# Patient Record
Sex: Female | Born: 1945 | Race: White | Hispanic: No | Smoking: Former smoker
Health system: Southern US, Community
[De-identification: ages and names within clinical notes are randomized; demographics above are authoritative.]

## PROBLEM LIST (undated history)

## (undated) DIAGNOSIS — T7840XA Allergy, unspecified, initial encounter: Secondary | ICD-10-CM

## (undated) DIAGNOSIS — M109 Gout, unspecified: Secondary | ICD-10-CM

## (undated) DIAGNOSIS — G4730 Sleep apnea, unspecified: Secondary | ICD-10-CM

## (undated) DIAGNOSIS — IMO0001 Reserved for inherently not codable concepts without codable children: Secondary | ICD-10-CM

## (undated) DIAGNOSIS — M1990 Unspecified osteoarthritis, unspecified site: Secondary | ICD-10-CM

## (undated) DIAGNOSIS — E119 Type 2 diabetes mellitus without complications: Secondary | ICD-10-CM

## (undated) DIAGNOSIS — I10 Essential (primary) hypertension: Secondary | ICD-10-CM

## (undated) DIAGNOSIS — R112 Nausea with vomiting, unspecified: Secondary | ICD-10-CM

## (undated) DIAGNOSIS — E785 Hyperlipidemia, unspecified: Secondary | ICD-10-CM

## (undated) DIAGNOSIS — D509 Iron deficiency anemia, unspecified: Secondary | ICD-10-CM

## (undated) DIAGNOSIS — R0902 Hypoxemia: Secondary | ICD-10-CM

## (undated) DIAGNOSIS — J45909 Unspecified asthma, uncomplicated: Secondary | ICD-10-CM

## (undated) DIAGNOSIS — Z9889 Other specified postprocedural states: Secondary | ICD-10-CM

## (undated) HISTORY — DX: Hyperlipidemia, unspecified: E78.5

## (undated) HISTORY — DX: Gout, unspecified: M10.9

## (undated) HISTORY — DX: Essential (primary) hypertension: I10

## (undated) HISTORY — PX: APPENDECTOMY: SHX54

## (undated) HISTORY — DX: Unspecified osteoarthritis, unspecified site: M19.90

## (undated) HISTORY — PX: HEMORROIDECTOMY: SUR656

## (undated) HISTORY — DX: Allergy, unspecified, initial encounter: T78.40XA

## (undated) HISTORY — DX: Type 2 diabetes mellitus without complications: E11.9

## (undated) HISTORY — DX: Hypoxemia: R09.02

## (undated) HISTORY — PX: DILATION AND CURETTAGE OF UTERUS: SHX78

## (undated) HISTORY — PX: TONSILLECTOMY AND ADENOIDECTOMY: SUR1326

## (undated) HISTORY — DX: Iron deficiency anemia, unspecified: D50.9

## (undated) HISTORY — PX: OTHER SURGICAL HISTORY: SHX169

---

## 1975-10-20 HISTORY — PX: CHOLECYSTECTOMY: SHX55

## 1978-10-19 HISTORY — PX: HEMORRHOID BANDING: SHX5850

## 2005-05-22 ENCOUNTER — Ambulatory Visit: Payer: Self-pay | Admitting: Unknown Physician Specialty

## 2005-07-19 LAB — HM COLONOSCOPY

## 2005-08-07 ENCOUNTER — Ambulatory Visit: Payer: Self-pay | Admitting: Unknown Physician Specialty

## 2005-08-08 ENCOUNTER — Ambulatory Visit: Payer: Self-pay | Admitting: Internal Medicine

## 2006-02-19 ENCOUNTER — Ambulatory Visit: Payer: Self-pay | Admitting: Family Medicine

## 2006-05-21 ENCOUNTER — Encounter: Payer: Self-pay | Admitting: Family Medicine

## 2006-06-10 ENCOUNTER — Ambulatory Visit: Payer: Self-pay

## 2006-06-19 ENCOUNTER — Encounter: Payer: Self-pay | Admitting: Family Medicine

## 2006-09-15 ENCOUNTER — Ambulatory Visit: Payer: Self-pay

## 2006-10-04 ENCOUNTER — Ambulatory Visit: Payer: Self-pay

## 2007-04-11 DIAGNOSIS — E669 Obesity, unspecified: Secondary | ICD-10-CM | POA: Insufficient documentation

## 2007-04-11 DIAGNOSIS — E668 Other obesity: Secondary | ICD-10-CM | POA: Insufficient documentation

## 2007-10-26 ENCOUNTER — Ambulatory Visit: Payer: Self-pay | Admitting: Family Medicine

## 2008-11-13 ENCOUNTER — Ambulatory Visit: Payer: Self-pay | Admitting: Family Medicine

## 2009-06-11 DIAGNOSIS — L84 Corns and callosities: Secondary | ICD-10-CM | POA: Insufficient documentation

## 2009-10-19 HISTORY — PX: HERNIA REPAIR: SHX51

## 2009-12-25 ENCOUNTER — Ambulatory Visit: Payer: Self-pay | Admitting: General Surgery

## 2010-01-02 ENCOUNTER — Ambulatory Visit: Payer: Self-pay | Admitting: General Surgery

## 2010-01-09 ENCOUNTER — Ambulatory Visit: Payer: Self-pay | Admitting: Family Medicine

## 2010-03-25 ENCOUNTER — Ambulatory Visit: Payer: Self-pay | Admitting: Family Medicine

## 2011-02-12 ENCOUNTER — Ambulatory Visit: Payer: Self-pay | Admitting: Family Medicine

## 2011-02-21 ENCOUNTER — Emergency Department: Payer: Self-pay | Admitting: Unknown Physician Specialty

## 2011-03-18 ENCOUNTER — Ambulatory Visit: Payer: Self-pay

## 2011-03-25 ENCOUNTER — Ambulatory Visit: Payer: Self-pay | Admitting: Anesthesiology

## 2011-03-26 ENCOUNTER — Ambulatory Visit: Payer: Self-pay

## 2011-03-30 LAB — PATHOLOGY REPORT

## 2011-03-31 LAB — HM PAP SMEAR: HM Pap smear: NORMAL

## 2011-08-26 ENCOUNTER — Encounter: Payer: Self-pay | Admitting: Rheumatology

## 2011-09-03 ENCOUNTER — Ambulatory Visit: Payer: Self-pay

## 2012-07-28 ENCOUNTER — Ambulatory Visit: Payer: Self-pay | Admitting: Family Medicine

## 2012-07-29 LAB — HM DEXA SCAN: HM Dexa Scan: ABNORMAL

## 2012-11-03 ENCOUNTER — Ambulatory Visit: Payer: Self-pay | Admitting: Family Medicine

## 2013-02-20 ENCOUNTER — Ambulatory Visit: Payer: Self-pay | Admitting: Family Medicine

## 2014-03-26 LAB — LIPID PANEL
Cholesterol: 162 mg/dL (ref 0–200)
HDL: 56 mg/dL (ref 35–70)
LDL Cholesterol: 57 mg/dL
Triglycerides: 245 mg/dL — AB (ref 40–160)

## 2014-08-03 ENCOUNTER — Ambulatory Visit: Payer: Self-pay | Admitting: Family Medicine

## 2014-08-03 LAB — HM MAMMOGRAPHY: HM Mammogram: NORMAL

## 2014-08-23 DIAGNOSIS — M51369 Other intervertebral disc degeneration, lumbar region without mention of lumbar back pain or lower extremity pain: Secondary | ICD-10-CM | POA: Insufficient documentation

## 2014-08-23 DIAGNOSIS — M5136 Other intervertebral disc degeneration, lumbar region: Secondary | ICD-10-CM | POA: Insufficient documentation

## 2014-11-30 LAB — HEMOGLOBIN A1C: Hgb A1c MFr Bld: 6.7 % — AB (ref 4.0–6.0)

## 2014-12-23 ENCOUNTER — Observation Stay: Payer: Self-pay | Admitting: Internal Medicine

## 2014-12-23 DIAGNOSIS — E049 Nontoxic goiter, unspecified: Secondary | ICD-10-CM | POA: Insufficient documentation

## 2014-12-23 DIAGNOSIS — R7989 Other specified abnormal findings of blood chemistry: Secondary | ICD-10-CM

## 2014-12-23 DIAGNOSIS — R079 Chest pain, unspecified: Secondary | ICD-10-CM

## 2014-12-24 ENCOUNTER — Other Ambulatory Visit: Payer: Self-pay | Admitting: Physician Assistant

## 2014-12-24 DIAGNOSIS — I361 Nonrheumatic tricuspid (valve) insufficiency: Secondary | ICD-10-CM

## 2015-02-01 ENCOUNTER — Other Ambulatory Visit: Payer: Self-pay | Admitting: Family Medicine

## 2015-02-01 DIAGNOSIS — E049 Nontoxic goiter, unspecified: Secondary | ICD-10-CM

## 2015-02-17 NOTE — Discharge Summary (Signed)
PATIENT NAME:  Amber Avery, Amber Avery MR#:  [id number] DATE OF BIRTH:  January 30, 1946  DATE OF ADMISSION:  12/23/2014 DATE OF DISCHARGE:  12/24/2014  PRIMARY CARE PHYSICIAN:  Steele Sizer, MD.    FINAL DIAGNOSES:  1. Pleuritic chest pain and leukocytosis.  2. Headache, unspecified.  3. Hypertension, essential.  4. History of gout.  5. Multinodular goiter, needs follow up as outpatient.  6. Diabetes.   MEDICATIONS ON DISCHARGE:  1. Benazepril 20 mg daily.  2. Furosemide 20 mg 1-2 tablets daily.  3. Klor-Con 20 mEq 1-2 tablets daily.  4. Atorvastatin 40 mg at bedtime.  5. Gabapentin 300 mg 2-3 times a day.  6. Allopurinol 300 mg daily.  7. Colchicine 0.6 mg twice a day as needed for gout.  8. Voltaren topical 1% 2-4 grams topically every 6 hours as needed for pain.  9. Triamcinolone topical 0.1% apply to affected area, she does have a cream and an ointment for that.  10. 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  11. Vitamin D3, 1000 international units at bedtime.  12. Aspirin 81 mg daily.  13. Prednisone 5 mg 4 tablets day 1, 3 tablets day 2, 2 tablets day 3, 1 tablet day 4 and 5.  14. Levaquin 500 mg daily for 9 days.  15. Stop taking metformin, but can restart on 12/25/2014 in the evening.   DIET: Low sodium, carbohydrate-controlled diet, regular consistency.   ACTIVITY: As tolerated.   FOLLOWUP:   1-2 weeks with Dr. Ancil Boozer.    HOSPITAL COURSE: The patient was admitted 12/23/2014, discharged 12/24/2014. Came in with chest pain and severe headache, was is admitted with chest pain unclear etiology.   LABORATORY AND RADIOLOGICAL DATA DURING THE HOSPITAL COURSE:  Included an EKG, showed normal sinus rhythm, no acute ST-T wave changes. Chest x-ray stable, mild likely chronic interstitial change. CPK normal range. Sedimentation rate normal range. BNP 106. D-dimer elevated at 2976. Glucose 193, BUN 28, creatinine 1.22, sodium 138, potassium 4.0, chloride 102, CO2 of 28,  calcium 9.5. Liver function tests normal range. White blood cell count 22.8, H and H 13.5 and 43.6, platelet count of 293,000. Troponin negative. CT scan of the head showed subtle low attenuation of the left temporal lobe with suggestion of mass effect on the temporal horn of the lateral ventricle, MRI suggested. Repeat EKG showed normal sinus rhythm. CT angio of the chest for PE showed poor to moderate exam quality for evaluation of pulmonary embolism, no large embolism identified, cardiomegaly with LAD, coronary artery atherosclerosis, enlarged thyroid with multiple nodules most likely multinodular goiter, consider  nonemergent characterization with thyroid ultrasound. Cardiac enzymes remain negative. Echocardiogram showed poor windows, EF 60%-65%, mild to moderate elevation of pulmonary arterial systolic pressure. MRI of the brain showed no acute abnormality, minimal chronic microvascular ischemia, left temporal lobe shows no focal abnormality, no mass, edema, or infarction present. CT finding likely represents artifact. Pulse oximetry room air 90%.   HOSPITAL COURSE PER PROBLEM LIST:  1.  Pleuritic chest pain and leukocytosis, likely pleurisy. We will give antibiotic and steroid. I did give prednisone here. Will prescribe Levaquin upon going home and a steroid taper. The patient was feeling better since coming into the hospital.  2.  Headache. I needed to give Toradol and IV magnesium, headache got better. The patient was well enough to go home. MRI of the brain was negative.  3.  Hypertension, essential, kept on her usual medications.  4.  History of gout, on allopurinol.  5.  Multinodular goiter.  Will need followup as outpatient with regards to thyroid testing and consideration of thyroid ultrasound. Follow up with Dr. Ancil Boozer for this.  6.  Diabetes. I am holding the Glucophage for 2 days secondary to IV contrast with CT scan.  TIME SPENT ON DISCHARGE: 35 minutes.     ____________________________ Tana Conch. Leslye Peer, MD rjw:bu D: 12/24/2014 15:35:02 ET T: 12/24/2014 18:21:03 ET JOB#: [id number]  cc: Tana Conch. Leslye Peer, MD, <Dictator> Bethena Roys. Ancil Boozer, MD Marisue Brooklyn MD ELECTRONICALLY SIGNED 12/26/2014 10:53

## 2015-02-17 NOTE — H&P (Signed)
 PATIENT NAME:  Amber Avery, Amber Avery MR#:  [id number] DATE OF BIRTH:  [birth year]  DATE OF ADMISSION:  12/23/2014  REFERRING PHYSICIAN: Sharyn Creamer, MD.   FAMILY PHYSICIAN: Onnie Boer. Sowles, MD.   REASON FOR ADMISSION: Chest pain with severe headache.   HISTORY OF PRESENT ILLNESS: The patient is a 69 year old female with a history of chronic back pain, sleep apnea, migraine headaches, diabetes, hypertension and hyperlipidemia. Presents to the emergency room today with acute onset of diffuse chest pain radiating to the neck and head. Some shortness of breath. No nausea, vomiting. No visual disturbance. In the emergency room, the patient was noted to have a leukocytosis and an abnormal EKG of unclear significance. Denies previous cardiac history. She is now admitted for further evaluation.   PAST MEDICAL HISTORY: 1. Chronic low back pain.  2. Obesity.  3. Migraine headaches.  4. Obstructive sleep apnea.  5. Benign hypertension.  6. Hyperlipidemia.  7. Type 2 diabetes.   MEDICATIONS: 1. Voltaren gel 2 to 4 grams every 6 hours as needed.  2. Demadex 20 mg p.o. b.i.d.  3. Zocor 80 mg p.o. at bedtime.  4. K-Dur 20 mEq p.o. b.i.d.  5. Metformin 500 mg p.o. b.i.d.  6. Gabapentin 300 mg p.o. 3 times a day.  7. Benazepril 20 mg p.o. b.i.d.  8. Aspirin 81 mg p.o. daily.  9. Allopurinol 300 mg p.o. daily.   ALLERGIES: CODEINE.   SOCIAL HISTORY: The patient quit smoking 15 years ago. No history of alcohol abuse.   FAMILY HISTORY: Positive for leukemia, coronary artery disease and hypertension. Also positive for diabetes. Negative for colon or breast cancer.   REVIEW OF SYSTEMS:  CONSTITUTIONAL: No fever or change in weight.  EYES: No blurred or double vision. No glaucoma.  ENT: No tinnitus or hearing loss. No nasal discharge or bleeding. No difficulty swallowing.  RESPIRATORY: No cough or wheezing. Denies hemoptysis. Does have some painful respiration.  CARDIOVASCULAR: Chest pain as per HPI. No  orthopnea or palpitations. No syncope.  GASTROINTESTINAL: No nausea, vomiting or diarrhea. No abdominal pain or change in bowel habits.  GENITOURINARY: No dysuria or hematuria. No incontinence.  ENDOCRINE: No polyuria or polydipsia. No heat or cold intolerance.  HEMATOLOGICAL: The patient denies anemia, easy bruising or bleeding.  LYMPHATIC: No swollen glands.  MUSCULOSKELETAL: The patient denies pain in her neck, shoulders, knees, hips. Some back pain. No gout.  NEUROLOGIC: No numbness. No recent migraines. Denies previous stroke or seizures.  PSYCHIATRIC: The patient denies anxiety, insomnia or depression.   PHYSICAL EXAMINATION: GENERAL: The patient is currently in no acute distress.  VITAL SIGNS: Remarkable for a blood pressure of 112/63, heart rate 102, respiratory rate 16, temperature 97.8, saturation 92% on room air.  HEENT: Normocephalic, atraumatic. Pupils regular and reactive to light and accommodation. Extraocular movements are intact. Sclerae are anicteric. Conjunctivae are clear. Oropharynx is clear.  NECK: Supple without JVD. No adenopathy or thyromegaly was noted.  LUNGS: Reveal scattered rhonchi without wheezes or rales. No dullness. Respiratory effort is normal.  CARDIAC: Regular rate and rhythm with normal S1, S2. No significant rubs, murmurs or gallops. PMI is nondisplaced. Chest wall is nontender.  ABDOMEN: Soft, nontender with normoactive bowel sounds. No organomegaly or masses were appreciated. No hernias or bruits were noted.  EXTREMITIES: Without clubbing or cyanosis; 1+ edema present. Distal pulses were 2+ bilaterally.  SKIN: Warm and dry without rash or lesions.  NEUROLOGIC: Cranial nerves II through XII grossly intact. Deep tendon reflexes were symmetric. Motor  and sensory examination is nonfocal.  PSYCHIATRIC: Revealed a patient who is alert and oriented to person, place and time. She was cooperative and used good judgment.   LABORATORY DATA: EKG revealed sinus  rhythm with diffuse ST elevation noted inferior laterally. Chest x-ray revealed chronic interstitial changes. Head CT reveals a questionable defect in the left temporal region suggestive of mass. White count was 22.8 with a hemoglobin of 13.5. Sedimentation rate was normal at 28. Initial troponin was less than 0.02. Glucose 193 with a BUN of 28, creatinine 1.22 and a GFR of 47.   ASSESSMENT: 1. Chest pain of unclear etiology.  2. Abnormal EKG.  3. Headache with abnormal head CT.  4. Dehydration.  5. Stage 2 chronic kidney disease.  6. Obesity.   PLAN: The patient will be observed on telemetry. We will obtain CT scan of the chest and an MRI of the brain. We will also obtain an echocardiogram. We will consult cardiology given the patient's chest pain and EKG abnormalities. We will follow her sugars with Accu-Cheks before meals and at bedtime and add sliding scale insulin as needed. Hydrate overnight with IV fluids. Begin empiric IV antibiotics given her leukocytosis. Follow up routine labs in the morning. Further treatment and evaluation will depend upon the patient's progress.   TOTAL TIME SPENT: On this patient was 50 minutes.     ____________________________ Duane Lope Judithann Sheen, MD jds:TT D: 12/23/2014 16:37:44 ET T: 12/23/2014 17:48:29 ET JOB#: [id number]  cc: Duane Lope. Judithann Sheen, MD, <Dictator> Onnie Boer. Carlynn Purl, MD Keeanna Villafranca Rodena Medin MD ELECTRONICALLY SIGNED 12/23/2014 20:11

## 2015-02-17 NOTE — Consult Note (Signed)
General Aspect 69 yo female with hx of HTN, DM, OSA admitted with pleuretic CP , elevated WBC and  abnormal ECG   Present Illness Pt sees Dr. Steele Sizer on a regular basis.  she has done well for years. has hx of THN with well controlled BP.  has Obstructive sleep apnea - uses CPAP every night. woke up with severe pleuretic CP.  intense pain with every inspiration.  extends from just below her breasts , up to her neck and into her head.   bilateral CP  denies fever, sputun, no hempotysis had a spinal injection several weeks ago .  was not bedridden for any significant length of time but she does not exercise   Physical Exam:  GEN well developed, well nourished, obese   HEENT pink conjunctivae, PERRL, hearing intact to voice, moist oral mucosa   NECK supple  No masses   RESP normal resp effort  clear BS   CARD Regular rate and rhythm  Normal, S1, S2  No murmur   ABD denies tenderness  denies Flank Tenderness  soft  normal BS   LYMPH negative neck   EXTR negative cyanosis/clubbing   SKIN normal to palpation, No rashes   NEURO cranial nerves intact, follows commands, motor/sensory function intact   PSYCH alert, A+O to time, place, person, good insight   Review of Systems:  Subjective/Chief Complaint CP since this am, definite pleuretic component   General: No Complaints   Skin: No Complaints   ENT: No Complaints   Eyes: No Complaints   Neck: No Complaints   Cardiovascular: No Complaints   Gastrointestinal: No Complaints   Vascular: No Complaints   Musculoskeletal: No Complaints   Neurologic: No Complaints   Hematologic: No Complaints   Endocrine: No Complaints   Psychiatric: No Complaints   Review of Systems: All other systems were reviewed and found to be negative   Family & Social History:  Family and Social History:  Family History Non-Contributory  no cardiac hx   Social History negative tobacco, negative ETOH   Place of Living Home      Lower Back Pinched nerve:    Sleep Apnea:    Migraines:   Home Medications: Medication Instructions Status  allopurinol 300 mg oral tablet 1  orally   daily Active  calcipotriene topical    60 GM apply at HS Active  benazepril 20 mg oral tablet 1  orally  2 x a day Active  potassiumCL   ER Micro takes 56mq 1 or 2 daily  Active  simvastatin 80 mg oral tablet 1  orally  daily HS Active  metformin 500 mg oral tablet 1 tab  two x a day Active  gabapentin 300 mg oral capsule 1 tab 3 x a day Active  Voltaren    uses 2 to 4 GRAMS every  6 hrs as needed Active  triamcinolone topical    one percent    uses pea sized amount to area as directed Active  vitamin D once weekly Active  asa takes 859mdaily Active  torsemide 20 mg oral tablet 1  orally  2 x a day Active   Lab Results: Hepatic:  06-Mar-16 14:36   Bilirubin, Total 0.8  Alkaline Phosphatase 104  SGPT (ALT) 25  SGOT (AST)  13  Total Protein, Serum 8.1  Albumin, Serum 4.2  Routine Chem:  06-Mar-16 14:36   Result Comment Hemoglobin/Hematocrit - RESULTS VERIFIED BY REPEAT TESTING.  Result(s) reported on 23 Dec 2014  at 03:00PM.  Glucose, Serum  193  BUN  28  Creatinine (comp) 1.22  Sodium, Serum 138  Potassium, Serum 4.0  Chloride, Serum 102  CO2, Serum 28  Calcium (Total), Serum 9.5  Osmolality (calc) 286  eGFR (African American)  57  eGFR (Non-African American)  47 (eGFR values <107m/min/1.73 m2 may be an indication of chronic kidney disease (CKD). Calculated eGFR, using the MRDR Study equation, is useful in  patients with stable renal function. The eGFR calculation will not be reliable in acutely ill patients when serum creatinine is changing rapidly. It is not useful in patients on dialysis. The eGFR calculation may not be applicable to patients at the low and high extremes of body sizes, pregnant women, and vegetarians.)  Anion Gap 8  Cardiac:  06-Mar-16 14:36   Troponin I < 0.02 (0.00-0.05 0.05 ng/mL or less:  NEGATIVE  Repeat testing in 3-6 hrs  if clinically indicated. >0.05 ng/mL: POTENTIAL  MYOCARDIAL INJURY. Repeat  testing in 3-6 hrs if  clinically indicated. NOTE: An increase or decrease  of 30% or more on serial  testing suggests a  clinically important change)  Routine Coag:  06-Mar-16 14:36   D-Dimer, Quantitative 2976 (INTERPRETATION <> Exclusion of Venous Thromboembolism (VTE) - OUTPATIENT ONLY       (Emergency Department or [place])             0-499 ng/ml (FEU)  : With a low to intermediate pretest                                  probability for VTE this test result                                  excludes the diagnosis of VTE.             > 499 ng/ml (FEU)  : VTE not excluded; additional work up                                  for VTE is required. <> Testing on Inpatients and Evaluation of Disseminated Intravascular        Coagulation (DIC)             Reference Range:  0-499 ng/ml (FEU))  Prothrombin 13.0 (11.4-15.0 NOTE: New Reference Range  11/16/14)  INR 1.0 (INR reference interval applies to patients on anticoagulant therapy. A single INR therapeutic range for coumarins is not optimal for all indications; however, the suggested range for most indications is 2.0 - 3.0. Exceptions to the INR Reference Range may include: Prosthetic heart valves, acute myocardial infarction, prevention of myocardial infarction, and combinations of aspirin and anticoagulant. The need for a higher or lower target INR must be assessed individually. Reference: The Pharmacology and Management of the Vitamin K  antagonists: the seventh ACCP Conference on Antithrombotic and Thrombolytic Therapy. CPJASN.0539Sept:126 (3suppl): [id number] A HCT value >55% may artifactually increase the PT.  In one study,  the increase was an average of 25%. Reference:  "Effect on Routine and Special Coagulation Testing Values of Citrate Anticoagulant Adjustment in Patients with High HCT Values." American  Journal of Clinical Pathology 27673;419:379-024)  Routine Hem:  06-Mar-16 14:36   Erythrocyte Sed Rate 28 (Result(s) reported on 23 Dec 2014 at 04:13PM.)  Neutrophil % 88.1  Lymphocyte % 5.3  Monocyte % 6.2  Eosinophil % 0.0  Basophil % 0.4  Neutrophil #  20.0  Lymphocyte # 1.2  Monocyte #  1.4  Eosinophil # 0.0  Basophil # 0.1  Reference Accession# [id number] (Result(s) reported on 23 Dec 2014 at 04:01PM.)  WBC (CBC)  22.8  RBC (CBC) 4.61  Hemoglobin (CBC) 13.5  Hematocrit (CBC) 43.6  Platelet Count (CBC) 293  MCV 95  MCH 29.3  MCHC  31.0  RDW  16.3   EKG:  EKG Interp. by me  NSR   Interpretation mild diffuse , nonspecific upsloping ST elevation.  appears c/w early repol.  no old tracings to compare   Radiology Results: XRay:    06-Mar-16 13:00, Chest PA and Lateral  Chest PA and Lateral   REASON FOR EXAM:    Chest Pain  COMMENTS:       PROCEDURE: DXR - DXR CHEST PA (OR AP) AND LATERAL  - Dec 23 2014  1:00PM     CLINICAL DATA:  Initial evaluation for mid sternal chest pain this  morning    EXAM:  CHEST  2 VIEW    COMPARISON:  02/20/2013    FINDINGS:  Heart size is normal. Vascular pattern is normal. Mild interstitial  prominence stable from prior study. No consolidation or effusion.     IMPRESSION:  Stable mild likely chronic interstitial change. The stability of  this appearance suggests mild chronic interstitial inflammatory  process.      Electronically Signed    By: Skipper Cliche M.D.    On: 12/23/2014 14:14         Verified By: Rachael Fee, M.D.,  CT:    06-Mar-16 15:02, CT Head Without Contrast  CT Head Without Contrast   REASON FOR EXAM:    headache since this morning, also having chest pain  COMMENTS:       PROCEDURE: CT  - CT HEAD WITHOUT CONTRAST  - Dec 23 2014  3:02PM     CLINICAL DATA:  Initial evaluation for headache with chest pain and  neck pain today    EXAM:  CT HEAD WITHOUT CONTRAST    TECHNIQUE:  Contiguous  axial images were obtained from the base of the skull  through the vertex without intravenous contrast.  COMPARISON:  None.    FINDINGS:  Mild age-appropriate atrophy. No intracranial hemorrhage or  extra-axial fluid. Subtle low-attenuation left temporal lobe. No  hydrocephalus. Left ventricular temporal horn asymmetrically thin  when compared to the contralateral side. Calvarium intact.     IMPRESSION:  Subtle low-attenuation left temporal lobe with suggestion of mass  effect on the temporal horn of the lateral ventricle. The  possibility of infarct or edema related to mass in this area is not  excluded. MRI suggested.    Electronically Signed    By: Skipper Cliche M.D.    On: 12/23/2014 16:26         Verified By: Rachael Fee, M.D.,    Codeine: GI Distress   Impression 1. Pleuretic Chest pain :  her symptoms are more consistent with a pulmonary process - ? pneumonia vs. pulmonary embolus.  CT angio of the chest has been ordered   Her ECG changes are not suggestive of MI.    continue to get Troponin levels.   Troponin is negative , despite having CP all day . she informed me at the end of my exam that she has  seen Dr. Clayborn Bigness in the past.  she has had a stress test that reportedly was normal.  Further evaluation and management per [facility] .   Agree with echo tomorrow to rule out pericarditis.  She has mild ST elevation but no PR depression.  2.  elevated WBC:  her CXR does not show any infiltrate.  no clear etiology for her pain and elevated WBC.  CT angio of the chest will be helpful.   3. Essential HTN:  continue current meds.  4. DM type 2.  plans per int. Med.   5. Obstructive sleep apnea:  continue CPAP .   Electronic Signatures: Nahser, Dreama Saa (MD)  (Signed 06-Mar-16 16:52)  Authored: General Aspect/Present Illness, History and Physical Exam, Review of System, Family & Social History, Past Medical History, Home Medications, Labs, EKG  , Radiology, Allergies, Impression/Plan   Last Updated: 06-Mar-16 16:52 by Acie Fredrickson Dreama Saa (MD)

## 2015-03-26 ENCOUNTER — Other Ambulatory Visit: Payer: Self-pay | Admitting: Family Medicine

## 2015-04-03 ENCOUNTER — Encounter: Payer: Self-pay | Admitting: Family Medicine

## 2015-04-03 DIAGNOSIS — G5600 Carpal tunnel syndrome, unspecified upper limb: Secondary | ICD-10-CM | POA: Insufficient documentation

## 2015-04-03 DIAGNOSIS — I2510 Atherosclerotic heart disease of native coronary artery without angina pectoris: Secondary | ICD-10-CM | POA: Insufficient documentation

## 2015-04-03 DIAGNOSIS — M1990 Unspecified osteoarthritis, unspecified site: Secondary | ICD-10-CM | POA: Insufficient documentation

## 2015-04-03 DIAGNOSIS — I10 Essential (primary) hypertension: Secondary | ICD-10-CM | POA: Insufficient documentation

## 2015-04-03 DIAGNOSIS — M47817 Spondylosis without myelopathy or radiculopathy, lumbosacral region: Secondary | ICD-10-CM | POA: Insufficient documentation

## 2015-04-03 DIAGNOSIS — M109 Gout, unspecified: Secondary | ICD-10-CM | POA: Insufficient documentation

## 2015-04-03 DIAGNOSIS — M5416 Radiculopathy, lumbar region: Secondary | ICD-10-CM | POA: Insufficient documentation

## 2015-04-03 DIAGNOSIS — E785 Hyperlipidemia, unspecified: Secondary | ICD-10-CM | POA: Insufficient documentation

## 2015-04-03 DIAGNOSIS — L409 Psoriasis, unspecified: Secondary | ICD-10-CM | POA: Insufficient documentation

## 2015-04-03 DIAGNOSIS — E559 Vitamin D deficiency, unspecified: Secondary | ICD-10-CM | POA: Insufficient documentation

## 2015-04-03 DIAGNOSIS — J45909 Unspecified asthma, uncomplicated: Secondary | ICD-10-CM | POA: Insufficient documentation

## 2015-04-03 DIAGNOSIS — G4733 Obstructive sleep apnea (adult) (pediatric): Secondary | ICD-10-CM | POA: Insufficient documentation

## 2015-04-03 DIAGNOSIS — N182 Chronic kidney disease, stage 2 (mild): Secondary | ICD-10-CM | POA: Insufficient documentation

## 2015-04-03 DIAGNOSIS — J4520 Mild intermittent asthma, uncomplicated: Secondary | ICD-10-CM | POA: Insufficient documentation

## 2015-04-03 DIAGNOSIS — R700 Elevated erythrocyte sedimentation rate: Secondary | ICD-10-CM | POA: Insufficient documentation

## 2015-04-03 DIAGNOSIS — M25569 Pain in unspecified knee: Secondary | ICD-10-CM | POA: Insufficient documentation

## 2015-04-03 DIAGNOSIS — E1129 Type 2 diabetes mellitus with other diabetic kidney complication: Secondary | ICD-10-CM | POA: Insufficient documentation

## 2015-04-03 DIAGNOSIS — D729 Disorder of white blood cells, unspecified: Secondary | ICD-10-CM | POA: Insufficient documentation

## 2015-04-03 DIAGNOSIS — D72828 Other elevated white blood cell count: Secondary | ICD-10-CM | POA: Insufficient documentation

## 2015-04-03 DIAGNOSIS — I8390 Asymptomatic varicose veins of unspecified lower extremity: Secondary | ICD-10-CM | POA: Insufficient documentation

## 2015-04-03 DIAGNOSIS — R600 Localized edema: Secondary | ICD-10-CM | POA: Insufficient documentation

## 2015-04-04 ENCOUNTER — Encounter: Payer: Self-pay | Admitting: Family Medicine

## 2015-04-04 ENCOUNTER — Ambulatory Visit (INDEPENDENT_AMBULATORY_CARE_PROVIDER_SITE_OTHER): Payer: Commercial Managed Care - HMO | Admitting: Family Medicine

## 2015-04-04 VITALS — BP 116/68 | HR 90 | Temp 97.7°F | Resp 16 | Ht 62.0 in | Wt 276.5 lb

## 2015-04-04 DIAGNOSIS — E668 Other obesity: Secondary | ICD-10-CM

## 2015-04-04 DIAGNOSIS — E1129 Type 2 diabetes mellitus with other diabetic kidney complication: Secondary | ICD-10-CM

## 2015-04-04 DIAGNOSIS — G4733 Obstructive sleep apnea (adult) (pediatric): Secondary | ICD-10-CM | POA: Diagnosis not present

## 2015-04-04 DIAGNOSIS — N182 Chronic kidney disease, stage 2 (mild): Secondary | ICD-10-CM | POA: Diagnosis not present

## 2015-04-04 DIAGNOSIS — E785 Hyperlipidemia, unspecified: Secondary | ICD-10-CM | POA: Diagnosis not present

## 2015-04-04 DIAGNOSIS — E559 Vitamin D deficiency, unspecified: Secondary | ICD-10-CM | POA: Diagnosis not present

## 2015-04-04 DIAGNOSIS — E669 Obesity, unspecified: Secondary | ICD-10-CM

## 2015-04-04 DIAGNOSIS — M109 Gout, unspecified: Secondary | ICD-10-CM | POA: Diagnosis not present

## 2015-04-04 DIAGNOSIS — I2510 Atherosclerotic heart disease of native coronary artery without angina pectoris: Secondary | ICD-10-CM | POA: Diagnosis not present

## 2015-04-04 DIAGNOSIS — E1121 Type 2 diabetes mellitus with diabetic nephropathy: Secondary | ICD-10-CM

## 2015-04-04 DIAGNOSIS — I10 Essential (primary) hypertension: Secondary | ICD-10-CM | POA: Diagnosis not present

## 2015-04-04 MED ORDER — METFORMIN HCL 500 MG PO TABS: 500.0000 mg | ORAL_TABLET | Freq: Two times a day (BID) | ORAL | Status: DC

## 2015-04-04 MED ORDER — TORSEMIDE 20 MG PO TABS: 20.0000 mg | ORAL_TABLET | Freq: Every day | ORAL | Status: DC

## 2015-04-04 MED ORDER — ALLOPURINOL 300 MG PO TABS: 300.0000 mg | ORAL_TABLET | Freq: Every day | ORAL | Status: DC

## 2015-04-04 NOTE — Patient Instructions (Signed)
Obesity Obesity is defined as having too much total body fat and a body mass index (BMI) of 30 or more. BMI is an estimate of body fat and is calculated from your height and weight. Obesity happens when you consume more calories than you can burn by exercising or performing daily physical tasks. Prolonged obesity can cause major illnesses or emergencies, such as:   Stroke.  Heart disease.  Diabetes.  Cancer.  Arthritis.  High blood pressure (hypertension).  High cholesterol.  Sleep apnea.  Erectile dysfunction.  Infertility problems. CAUSES   Regularly eating unhealthy foods.  Physical inactivity.  Certain disorders, such as an underactive thyroid (hypothyroidism), Cushing's syndrome, and polycystic ovarian syndrome.  Certain medicines, such as steroids, some depression medicines, and antipsychotics.  Genetics.  Lack of sleep. DIAGNOSIS  A health care provider can diagnose obesity after calculating your BMI. Obesity will be diagnosed if your BMI is 30 or higher.  There are other methods of measuring obesity levels. Some other methods include measuring your skinfold thickness, your waist circumference, and comparing your hip circumference to your waist circumference. TREATMENT  A healthy treatment program includes some or all of the following:  Long-term dietary changes.  Exercise and physical activity.  Behavioral and lifestyle changes.  Medicine only under the supervision of your health care provider. Medicines may help, but only if they are used with diet and exercise programs. An unhealthy treatment program includes:  Fasting.  Fad diets.  Supplements and drugs. These choices do not succeed in long-term weight control.  HOME CARE INSTRUCTIONS   Exercise and perform physical activity as directed by your health care provider. To increase physical activity, try the following:  Use stairs instead of elevators.  Park farther away from store  entrances.  Garden, bike, or walk instead of watching television or using the computer.  Eat healthy, low-calorie foods and drinks on a regular basis. Eat more fruits and vegetables. Use low-calorie cookbooks or take healthy cooking classes.  Limit fast food, sweets, and processed snack foods.  Eat smaller portions.  Keep a daily journal of everything you eat. There are many free websites to help you with this. It may be helpful to measure your foods so you can determine if you are eating the correct portion sizes.  Avoid drinking alcohol. Drink more water and drinks without calories.  Take vitamins and supplements only as recommended by your health care provider.  Weight-loss support groups, registered dietitians, counselors, and stress reduction education can also be very helpful. SEEK IMMEDIATE MEDICAL CARE IF:  You have chest pain or tightness.  You have trouble breathing or feel short of breath.  You have weakness or leg numbness.  You feel confused or have trouble talking.  You have sudden changes in your vision. MAKE SURE YOU:  Understand these instructions.  Will watch your condition.  Will get help right away if you are not doing well or get worse. Document Released: 11/12/2004 Document Revised: 02/19/2014 Document Reviewed: 11/11/2011 Exit[employer] Patient Information 2015 [employer]. This information is not intended to replace advice given to you by your health care provider. Make sure you discuss any questions you have with your health care provider.  

## 2015-04-04 NOTE — Progress Notes (Signed)
Name: Amber Avery   MRN: [id number]    DOB: 09-01-1946   Date:04/04/2015       Progress Note  Subjective  Chief Complaint  Chief Complaint  Patient presents with  . Hypertension  . Diabetes    checks 2 qd avg-190  . Hyperlipidemia    leg cramps    HPI  DM with renal manifestation:  Last hgbA1C was 6.7 back in February 2016, she said her fasting glucose is around 150 now.  Average was 190 for 7 days. Her urine micro is up to date. She is on metformin. She denies polyphagia, but feels very thirsty at night, no polyuria.  Eye exam is up to date.    Dyslipidemia: she is due for repeat labs, taking Atorvastatin, aspirin, history of CAD, no chest pain, no change in exercise tolerance.   HTN: well controlled, taking medication as prescribed, denies side effects  Gout: no recent episodes, controlled with medications  Obesity: she has gained 4 lbs since last visit , she states she has not changed her diet, uses a cane and not very physically active.    OSA: she wear machine every night, she needs a replacement for her mask.    Patient Active Problem List   Diagnosis Date Noted  . Asthma, intermittent 04/03/2015  . Benign essential HTN 04/03/2015  . Carpal tunnel syndrome 04/03/2015  . Chronic kidney disease (CKD), stage II (mild) 04/03/2015  . Controlled gout 04/03/2015  . Arteriosclerosis of coronary artery 04/03/2015  . Diabetes mellitus with renal manifestations, controlled 04/03/2015  . Dyslipidemia 04/03/2015  . Edema extremities 04/03/2015  . Elevated sedimentation rate 04/03/2015  . Knee pain 04/03/2015  . Lumbar radiculitis 04/03/2015  . Obstructive apnea 04/03/2015  . Lumbosacral spondylosis 04/03/2015  . Osteoarthritis, chronic 04/03/2015  . Psoriasis 04/03/2015  . Vitamin D deficiency 04/03/2015  . Varicose veins 04/03/2015  . Goiter 12/23/2014  . Corns and callosity 06/11/2009  . Extreme obesity 04/11/2007    Past Surgical History  Procedure Laterality  Date  . Cholecystectomy  1977  . Hemorrhoid banding  1980  . Hernia repair  2011    Family History  Problem Relation Age of Onset  . Cancer Mother     brain tumor  . Kidney disease Mother   . Hypertension Mother   . Heart disease Father   . Hypertension Sister   . Arthritis Sister   . Cancer Sister   . Hernia Sister   . GER disease Sister   . Lupus Sister   . Diabetes Sister     History   Social History  . Marital Status: Divorced    Spouse Name: N/A  . Number of Children: N/A  . Years of Education: N/A   Occupational History  . Not on file.   Social History Main Topics  . Smoking status: Former Smoker -- 15 years  . Smokeless tobacco: Not on file  . Alcohol Use: 0.0 oz/week    0 Standard drinks or equivalent per week     Comment: occasssionally wine  . Drug Use: No  . Sexual Activity: Not Currently   Other Topics Concern  . Not on file   Social History Narrative  . No narrative on file     Current outpatient prescriptions:  .  metFORMIN (GLUCOPHAGE) 500 MG tablet, TAKE 1 TABLET TWICE DAILY, Disp: 180 tablet, Rfl: 1  Allergies  Allergen Reactions  . Codeine      ROS  Constitutional: Negative for fever  she has  weight change.  Respiratory: Negative for cough and shortness of breath.   Cardiovascular: Negative for chest pain or palpitations.  Gastrointestinal: Negative for abdominal pain, no bowel changes.  Musculoskeletal: Negative for joint swelling. She has low back pain, going to see Dr. Phyllis Ginger this month. She has pain on both knee Skin: Psoriasis is doing better, rash on left leg looking much better Neurological: Negative for dizziness or headache.  No other specific complaints in a complete review of systems (except as listed in HPI above).  Objective  Filed Vitals:   04/04/15 0811  BP: 116/68  Pulse: 90  Temp: 97.7 F (36.5 C)  TempSrc: Oral  Resp: 16  Height: 5\' 2"  (1.575 m)  Weight: 276 lb 8 oz (125.42 kg)  SpO2: 93%     Body mass index is 50.56 kg/(m^2).  Physical Exam  Constitutional: Patient appears well-developed and well-nourished. No distress. Morbid obese HENT: Head: Normocephalic and atraumatic. Nose: Nose normal. Mouth/Throat: Oropharynx is clear and moist. No oropharyngeal exudate.  Eyes: Conjunctivae and EOM are normal. Pupils are equal, round, and reactive to light. No scleral icterus.  Neck: Normal range of motion. Neck supple. No JVD present. No thyromegaly present.  Cardiovascular: Normal rate, regular rhythm and normal heart sounds.  No murmur heard. Trace pretibial edema Pulmonary/Chest: Effort normal and breath sounds normal. No respiratory distress. Abdominal: Soft. There is no tenderness. no masses Musculoskeletal: Using a cane, low back pain with palpation, crepitus with extension of both knees Neurological: he is alert and oriented to person, place, and time. No cranial nerve deficit. Coordination, balance, strength, speech and gait are normal.  Skin: Skin is warm and dry. Rash resolving on legs Psychiatric: Patient has a normal mood and affect. behavior is normal. Judgment and thought content normal.    Diabetic Foot Exam: Diabetic Foot Exam - Simple   Simple Foot Form  Visual Inspection  No deformities, no ulcerations, no other skin breakdown bilaterally:  Yes  Sensation Testing  Intact to touch and monofilament testing bilaterally:  Yes  Pulse Check  Posterior Tibialis and Dorsalis pulse intact bilaterally:  Yes  Comments      PHQ2/9: Depression screen PHQ 2/9 04/04/2015  Decreased Interest 0  Down, Depressed, Hopeless 1  PHQ - 2 Score 1  Altered sleeping 1  Tired, decreased energy 0  Change in appetite 1  Feeling bad or failure about yourself  0  Trouble concentrating 0  Moving slowly or fidgety/restless 0  Suicidal thoughts 0  PHQ-9 Score 3  Difficult doing work/chores Not difficult at all   She misses her old dog, the new dog is very mean  Fall  Risk: Fall Risk  04/04/2015  Falls in the past year? No     Assessment & Plan   1. Diabetes mellitus with renal manifestations, controlled Glucose going up at home, we will adjust metformin to one in am and 2 in pm - metFORMIN (GLUCOPHAGE) 500 MG tablet; Take 1 tablet (500 mg total) by mouth 2 (two) times daily.  Dispense: 270 tablet; Refill: 0 - HgB A1c  2. Chronic kidney disease (CKD), stage II (mild) Recheck function  3. Extreme obesity Discussed importance of weight loss  4. Dyslipidemia  - Lipid Profile  5. Obstructive apnea Very compliant, wears every night, all night - For home use only DME continuous positive airway pressure (CPAP)  6. Controlled gout  - allopurinol (ZYLOPRIM) 300 MG tablet; Take 1 tablet (300 mg total) by mouth daily.  Dispense: 90 tablet; Refill: 4 - Uric acid  7. Vitamin D deficiency  - Vitamin D (25 hydroxy)  8. Benign essential HTN At goal - torsemide (DEMADEX) 20 MG tablet; Take 1-2 tablets (20-40 mg total) by mouth daily.  Dispense: 180 tablet; Refill: 1 - Comprehensive Metabolic Panel (CMET)  9. Arteriosclerosis of coronary artery Continue aspirin, Ace and Atorvastatin

## 2015-04-05 LAB — LIPID PANEL
Chol/HDL Ratio: 3.1 ratio (ref 0.0–4.4)
Cholesterol, Total: 156 mg/dL (ref 100–199)
HDL: 50 mg/dL
LDL Calculated: 71 mg/dL (ref 0–99)
Triglycerides: 173 mg/dL — ABNORMAL HIGH (ref 0–149)
VLDL Cholesterol Cal: 35 mg/dL (ref 5–40)

## 2015-04-05 LAB — COMPREHENSIVE METABOLIC PANEL WITH GFR
ALT: 14 [IU]/L (ref 0–32)
AST: 15 [IU]/L (ref 0–40)
Albumin/Globulin Ratio: 2 (ref 1.1–2.5)
Albumin: 4.2 g/dL (ref 3.6–4.8)
Alkaline Phosphatase: 86 [IU]/L (ref 39–117)
BUN/Creatinine Ratio: 30 — ABNORMAL HIGH (ref 11–26)
BUN: 27 mg/dL (ref 8–27)
Bilirubin Total: 0.3 mg/dL (ref 0.0–1.2)
CO2: 21 mmol/L (ref 18–29)
Calcium: 9.5 mg/dL (ref 8.7–10.3)
Chloride: 103 mmol/L (ref 97–108)
Creatinine, Ser: 0.9 mg/dL (ref 0.57–1.00)
GFR calc Af Amer: 77 mL/min/{1.73_m2}
GFR calc non Af Amer: 66 mL/min/{1.73_m2}
Globulin, Total: 2.1 g/dL (ref 1.5–4.5)
Glucose: 126 mg/dL — ABNORMAL HIGH (ref 65–99)
Potassium: 4.5 mmol/L (ref 3.5–5.2)
Sodium: 143 mmol/L (ref 134–144)
Total Protein: 6.3 g/dL (ref 6.0–8.5)

## 2015-04-05 LAB — HEMOGLOBIN A1C
Est. average glucose Bld gHb Est-mCnc: 157 mg/dL
Hgb A1c MFr Bld: 7.1 % — ABNORMAL HIGH (ref 4.8–5.6)

## 2015-04-05 LAB — URIC ACID: Uric Acid: 6 mg/dL (ref 2.5–7.1)

## 2015-04-05 LAB — VITAMIN D 25 HYDROXY (VIT D DEFICIENCY, FRACTURES): Vit D, 25-Hydroxy: 36 ng/mL (ref 30.0–100.0)

## 2015-04-08 ENCOUNTER — Other Ambulatory Visit: Payer: Self-pay | Admitting: Family Medicine

## 2015-06-19 ENCOUNTER — Ambulatory Visit (INDEPENDENT_AMBULATORY_CARE_PROVIDER_SITE_OTHER): Payer: Commercial Managed Care - HMO | Admitting: Family Medicine

## 2015-06-19 ENCOUNTER — Encounter: Payer: Self-pay | Admitting: Family Medicine

## 2015-06-19 VITALS — BP 118/72 | HR 79 | Temp 98.2°F | Resp 14 | Ht 62.0 in | Wt 270.3 lb

## 2015-06-19 DIAGNOSIS — E1121 Type 2 diabetes mellitus with diabetic nephropathy: Secondary | ICD-10-CM | POA: Diagnosis not present

## 2015-06-19 DIAGNOSIS — Z23 Encounter for immunization: Secondary | ICD-10-CM | POA: Diagnosis not present

## 2015-06-19 DIAGNOSIS — Z7189 Other specified counseling: Secondary | ICD-10-CM | POA: Diagnosis not present

## 2015-06-19 DIAGNOSIS — Z1239 Encounter for other screening for malignant neoplasm of breast: Secondary | ICD-10-CM | POA: Diagnosis not present

## 2015-06-19 DIAGNOSIS — Z1211 Encounter for screening for malignant neoplasm of colon: Secondary | ICD-10-CM | POA: Diagnosis not present

## 2015-06-19 DIAGNOSIS — E668 Other obesity: Secondary | ICD-10-CM

## 2015-06-19 DIAGNOSIS — Z124 Encounter for screening for malignant neoplasm of cervix: Secondary | ICD-10-CM

## 2015-06-19 DIAGNOSIS — G4733 Obstructive sleep apnea (adult) (pediatric): Secondary | ICD-10-CM | POA: Diagnosis not present

## 2015-06-19 DIAGNOSIS — E669 Obesity, unspecified: Secondary | ICD-10-CM

## 2015-06-19 DIAGNOSIS — Z0000 Encounter for general adult medical examination without abnormal findings: Secondary | ICD-10-CM

## 2015-06-19 DIAGNOSIS — E1129 Type 2 diabetes mellitus with other diabetic kidney complication: Secondary | ICD-10-CM

## 2015-06-19 DIAGNOSIS — Z719 Counseling, unspecified: Secondary | ICD-10-CM

## 2015-06-19 NOTE — Patient Instructions (Signed)
  Amber Avery , Thank you for taking time to come for your Medicare Wellness Visit. I appreciate your ongoing commitment to your health goals. Please review the following plan we discussed and let me know if I can assist you in the future.   These are the goals we discussed: Discussed importance of 150 minutes of physical activity weekly, eat two servings of fish weekly, eat one serving of tree nuts ( cashews, pistachios, pecans, almonds.[name] Kitchen) every other day, eat 6 servings of fruit/vegetables daily and drink plenty of water and avoid sweet beverages.    This is a list of the screening recommended for you and due dates:  Health Maintenance  Topic Date Due  . Colon Cancer Screening  07/20/2015  . Hemoglobin A1C  10/04/2015  . Urine Protein Check  12/01/2015  . Eye exam for diabetics  03/04/2016  . Complete foot exam   04/03/2016  . Flu Shot  05/19/2016  . Mammogram  08/03/2016  . Tetanus Vaccine  08/31/2019  . DEXA scan (bone density measurement)  Completed  . Shingles Vaccine  Completed  .  Hepatitis C: One time screening is recommended by Center for Disease Control  (CDC) for  adults born from [birth year] through 1964.   Completed  . Pneumonia vaccines  Completed

## 2015-06-19 NOTE — Progress Notes (Signed)
Name: Amber Avery   MRN: [id number]    DOB: 01-24-1946   Date:06/19/2015       Progress Note  Subjective  Chief Complaint  Chief Complaint  Patient presents with  . Annual Exam    HPI  DMII: doing well now, compliant with her diet, has lost 7 lbs since last visit, and average sugar for the past 7 days is 118 and past 14 days is 120 . Medication was also adjusted on her last visit and is working well for her, no side effects  OSA: she has a new mask, and is doing well except that it makes her mouth really dry, she has a humidifier. Wears it every nigh - all night.  Functional ability/safety issues: Can't squat otherwise no limitations Hearing issues: Addressed  Activities of daily living: Discussed Home safety issues: No Issues  End Of Life Planning:she has  advanced directives, healthcare power of attorney.  Preventative care, Health maintenance, Preventative health measures discussed.  Preventative screenings discussed today: lab work, colonoscopy i- last one in 2006  PAP - last one in 2012 - no history of abnormal pap smears and we will no longer check it, mammogram, DEXA done in 2013.  Low Dose CT Chest recommended if Age 28-80 years, 30 pack-year currently smoking OR have quit w/in 15years.   Lifestyle risk factor issued reviewed: Diet, exercise, weight management,  nutrition/diet.  Preventative health measures discussed (5-10 year plan).  Reviewed and recommended vaccinations: - Pneumovax  - Prevnar  - Annual Influenza - Zostavax - Tdap   Depression screening: Done Fall risk screening: Done Discuss ADLs/IADLs: Done  Current medical providers: See HPI  Other health risk factors identified this visit: No other issues Cognitive impairment issues: None identified  All above discussed with patient. Appropriate education, counseling and referral will be made based upon the above.   Patient Active Problem List   Diagnosis Date Noted  . Asthma, intermittent  04/03/2015  . Benign essential HTN 04/03/2015  . Carpal tunnel syndrome 04/03/2015  . Chronic kidney disease (CKD), stage II (mild) 04/03/2015  . Controlled gout 04/03/2015  . Arteriosclerosis of coronary artery 04/03/2015  . Diabetes mellitus with renal manifestations, controlled 04/03/2015  . Dyslipidemia 04/03/2015  . Edema extremities 04/03/2015  . Elevated sedimentation rate 04/03/2015  . Knee pain 04/03/2015  . Lumbar radiculitis 04/03/2015  . Obstructive apnea 04/03/2015  . Lumbosacral spondylosis 04/03/2015  . Osteoarthritis, chronic 04/03/2015  . Psoriasis 04/03/2015  . Vitamin D deficiency 04/03/2015  . Varicose veins 04/03/2015  . Goiter 12/23/2014  . Corns and callosity 06/11/2009  . Extreme obesity 04/11/2007    Past Surgical History  Procedure Laterality Date  . Cholecystectomy  1977  . Hemorrhoid banding  1980  . Hernia repair  2011    Family History  Problem Relation Age of Onset  . Cancer Mother     brain tumor  . Kidney disease Mother   . Hypertension Mother   . Heart disease Father   . Hypertension Sister   . Arthritis Sister   . Cancer Sister   . Hernia Sister   . GER disease Sister   . Lupus Sister   . Diabetes Sister     Social History   Social History  . Marital Status: Divorced    Spouse Name: N/A  . Number of Children: N/A  . Years of Education: N/A   Occupational History  . Not on file.   Social History Main Topics  . Smoking status:  Former Smoker -- 15 years  . Smokeless tobacco: Never Used  . Alcohol Use: 0.0 oz/week    0 Standard drinks or equivalent per week     Comment: occasssionally wine  . Drug Use: No  . Sexual Activity: Not Currently   Other Topics Concern  . Not on file   Social History Narrative     Current outpatient prescriptions:  .  allopurinol (ZYLOPRIM) 300 MG tablet, Take 1 tablet (300 mg total) by mouth daily., Disp: 90 tablet, Rfl: 4 .  aspirin 81 MG tablet, Take 1 tablet by mouth daily., Disp: ,  Rfl:  .  atorvastatin (LIPITOR) 40 MG tablet, Take 1 tablet by mouth daily., Disp: , Rfl:  .  benazepril (LOTENSIN) 20 MG tablet, Take 1 tablet by mouth daily., Disp: , Rfl:  .  calcipotriene (DOVONOX) 0.005 % cream, Apply 1 application topically at bedtime., Disp: , Rfl:  .  clobetasol ointment (TEMOVATE) 4.03 %, Apply 1 application topically 2 (two) times daily., Disp: , Rfl:  .  colchicine 0.6 MG tablet, Take 1 tablet by mouth 2 (two) times daily., Disp: , Rfl:  .  diclofenac sodium (VOLTAREN) 1 % GEL, Apply topically 4 (four) times daily., Disp: , Rfl:  .  gabapentin (NEURONTIN) 300 MG capsule, Take 1 capsule by mouth 3 (three) times daily., Disp: , Rfl:  .  metFORMIN (GLUCOPHAGE) 500 MG tablet, Take 1 tablet (500 mg total) by mouth 2 (two) times daily., Disp: 270 tablet, Rfl: 0 .  potassium chloride SA (K-DUR,KLOR-CON) 20 MEQ tablet, Take 1-2 tablets by mouth daily., Disp: , Rfl:  .  tiZANidine (ZANAFLEX) 4 MG tablet, Take 1 tablet by mouth 2 (two) times daily., Disp: , Rfl:  .  torsemide (DEMADEX) 20 MG tablet, Take 1-2 tablets (20-40 mg total) by mouth daily., Disp: 180 tablet, Rfl: 1 .  triamcinolone cream (KENALOG) 0.1 %, Apply 1 application topically as needed., Disp: , Rfl:  .  triamcinolone ointment (KENALOG) 0.1 %, Apply 1 application topically as needed., Disp: , Rfl:  .  Vitamin D, Cholecalciferol, 1000 UNITS TABS, Take 1 capsule by mouth daily., Disp: , Rfl:   Allergies  Allergen Reactions  . Codeine      ROS  Constitutional: Negative for fever , positive for weight change.  Respiratory: Negative for cough and shortness of breath.   Cardiovascular: Negative for chest pain or palpitations.  Gastrointestinal: Negative for abdominal pain, no bowel changes.  Musculoskeletal: Negative for gait problem or joint swelling.  Skin: Negative for rash. Except from excoriations from her dog jumping on her Neurological: Negative for dizziness or headache.  No other specific  complaints in a complete review of systems (except as listed in HPI above).  Objective  Filed Vitals:   06/19/15 0812  BP: 118/72  Pulse: 79  Temp: 98.2 F (36.8 C)  TempSrc: Oral  Resp: 14  Height: 5\' 2"  (1.575 m)  Weight: 270 lb 4.8 oz (122.607 kg)  SpO2: 94%    Body mass index is 49.43 kg/(m^2).  Physical Exam  Constitutional: Patient appears well-developed and well-nourished. No distress.  HENT: Head: Normocephalic and atraumatic. Ears: B TMs ok, no erythema or effusion; Nose: Nose normal. Mouth/Throat: Oropharynx is clear and moist. No oropharyngeal exudate. Thyroid enlarged - will have US neck Eyes: Conjunctivae and EOM are normal. Pupils are equal, round, and reactive to light. No scleral icterus.  Neck: Normal range of motion. Neck supple. No JVD present. No thyromegaly present.  Cardiovascular: Normal rate,  regular rhythm and normal heart sounds.  No murmur heard. No BLE edema. Pulmonary/Chest: Effort normal and breath sounds normal. No respiratory distress. Abdominal: Soft. Bowel sounds are normal, no distension. There is no tenderness. no masses Breast: no lumps or masses, no nipple discharge or rashes FEMALE GENITALIA:  External genitalia erythematous and atrophic - seen by GYN on topical steroids External urethra normal RECTAL: not done Musculoskeletal: Normal range of motion back , no joint effusions. No gross deformities Neurological: he is alert and oriented to person, place, and time. No cranial nerve deficit. Coordination, balance, strength, speech and gait are normal.  Skin: Skin is warm and dry. No rash noted. No erythema.  Psychiatric: Patient has a normal mood and affect. behavior is normal. Judgment and thought content normal.  Recent Results (from the past 2160 hour(s))  Lipid Profile     Status: Abnormal   Collection Time: 04/04/15  9:22 AM  Result Value Ref Range   Cholesterol, Total 156 100 - 199 mg/dL   Triglycerides 173 (H) 0 - 149 mg/dL   HDL  50 >39 mg/dL    Comment: According to ATP-III Guidelines, HDL-C >59 mg/dL is considered a negative risk factor for CHD.    VLDL Cholesterol Cal 35 5 - 40 mg/dL   LDL Calculated 71 0 - 99 mg/dL   Chol/HDL Ratio 3.1 0.0 - 4.4 ratio units    Comment:                                   T. Chol/HDL Ratio                                             Men  Women                               1/2 Avg.Risk  3.4    3.3                                   Avg.Risk  5.0    4.4                                2X Avg.Risk  9.6    7.1                                3X Avg.Risk 23.4   11.0   Comprehensive Metabolic Panel (CMET)     Status: Abnormal   Collection Time: 04/04/15  9:22 AM  Result Value Ref Range   Glucose 126 (H) 65 - 99 mg/dL   BUN 27 8 - 27 mg/dL   Creatinine, Ser 0.90 0.57 - 1.00 mg/dL   GFR calc non Af Amer 66 >59 mL/min/1.73   GFR calc Af Amer 77 >59 mL/min/1.73   BUN/Creatinine Ratio 30 (H) 11 - 26   Sodium 143 134 - 144 mmol/L   Potassium 4.5 3.5 - 5.2 mmol/L   Chloride 103 97 - 108 mmol/L   CO2 21 18 - 29 mmol/L   Calcium 9.5 8.7 - 10.3 mg/dL  Total Protein 6.3 6.0 - 8.5 g/dL   Albumin 4.2 3.6 - 4.8 g/dL   Globulin, Total 2.1 1.5 - 4.5 g/dL   Albumin/Globulin Ratio 2.0 1.1 - 2.5   Bilirubin Total 0.3 0.0 - 1.2 mg/dL   Alkaline Phosphatase 86 39 - 117 IU/L   AST 15 0 - 40 IU/L   ALT 14 0 - 32 IU/L  Uric acid     Status: None   Collection Time: 04/04/15  9:22 AM  Result Value Ref Range   Uric Acid 6.0 2.5 - 7.1 mg/dL    Comment:            Therapeutic target for gout patients: <6.0  Vitamin D (25 hydroxy)     Status: None   Collection Time: 04/04/15  9:22 AM  Result Value Ref Range   Vit D, 25-Hydroxy 36.0 30.0 - 100.0 ng/mL    Comment: Vitamin D deficiency has been defined by the [place] and an Endocrine Society practice guideline as a level of serum 25-OH vitamin D less than 20 ng/mL (1,2). The Endocrine Society went on to further define vitamin  D insufficiency as a level between 21 and 29 ng/mL (2). 1. IOM (Institute of Medicine). 2010. Dietary reference    intakes for calcium and D. [place]: The    [employer]. 2. Holick MF, Binkley [place], Bischoff-Ferrari HA, et al.    Evaluation, treatment, and prevention of vitamin D    deficiency: an Endocrine Society clinical practice    guideline. JCEM. 2011 Jul; 96(7):1911-30.   HgB A1c     Status: Abnormal   Collection Time: 04/04/15  9:22 AM  Result Value Ref Range   Hgb A1c MFr Bld 7.1 (H) 4.8 - 5.6 %    Comment:          Pre-diabetes: 5.7 - 6.4          Diabetes: >6.4          Glycemic control for adults with diabetes: <7.0    Est. average glucose Bld gHb Est-mCnc 157 mg/dL     PHQ2/9: Depression screen [facility] 2/9 06/19/2015 04/04/2015  Decreased Interest 0 0  Down, Depressed, Hopeless 0 1  PHQ - 2 Score 0 1  Altered sleeping - 1  Tired, decreased energy - 0  Change in appetite - 1  Feeling bad or failure about yourself  - 0  Trouble concentrating - 0  Moving slowly or fidgety/restless - 0  Suicidal thoughts - 0  PHQ-9 Score - 3  Difficult doing work/chores - Not difficult at all     Fall Risk: Fall Risk  06/19/2015 04/04/2015  Falls in the past year? No No      Assessment & Plan  1. Medicare annual wellness visit, subsequent   2. Needs flu shot  - Flu vaccine HIGH DOSE PF (Fluzone High dose)  3. Obstructive apnea Continue CPAP machine  4. Diabetes mellitus with renal manifestations, controlled Doing well, recheck labs next visit   5. Extreme obesity Discussed with the patient the risk posed by an increased BMI. Discussed importance of portion control, calorie counting and at least 150 minutes of physical activity weekly. Avoid sweet beverages and drink more water. Eat at least 6 servings of fruit and vegetables daily    6. Colon cancer screening  - Ambulatory referral to Gastroenterology  7. Cervical cancer screening No longer  needed  8. Health counseling Discussed importance of 150 minutes of physical activity weekly,  eat two servings of fish weekly, eat one serving of tree nuts ( cashews, pistachios, pecans, almonds.[name] Kitchen) every other day, eat 6 servings of fruit/vegetables daily and drink plenty of water and avoid sweet beverages.   9. Breast cancer screening  - MM Digital Screening; Future

## 2015-06-25 ENCOUNTER — Telehealth: Payer: Self-pay | Admitting: Family Medicine

## 2015-06-25 NOTE — Telephone Encounter (Signed)
Malachy Mood from the hospital states patient is coming in tomorrow for ultrasound and she is needing a signed order. Please fax to [phone]

## 2015-06-26 ENCOUNTER — Other Ambulatory Visit: Payer: Self-pay | Admitting: Family Medicine

## 2015-06-26 ENCOUNTER — Ambulatory Visit
Admission: RE | Admit: 2015-06-26 | Discharge: 2015-06-26 | Disposition: A | Discharge status: Home / Self Care | Payer: Commercial Managed Care - HMO | Source: Ambulatory Visit | Attending: Family Medicine | Admitting: Family Medicine

## 2015-06-26 DIAGNOSIS — E041 Nontoxic single thyroid nodule: Secondary | ICD-10-CM | POA: Insufficient documentation

## 2015-06-26 DIAGNOSIS — E049 Nontoxic goiter, unspecified: Secondary | ICD-10-CM | POA: Diagnosis present

## 2015-06-26 DIAGNOSIS — M5416 Radiculopathy, lumbar region: Secondary | ICD-10-CM

## 2015-06-26 NOTE — Progress Notes (Signed)
Patient notified

## 2015-08-05 ENCOUNTER — Ambulatory Visit
Admission: RE | Admit: 2015-08-05 | Discharge: 2015-08-05 | Disposition: A | Discharge status: Home / Self Care | Payer: Commercial Managed Care - HMO | Source: Ambulatory Visit | Attending: Family Medicine | Admitting: Family Medicine

## 2015-08-05 DIAGNOSIS — Z1231 Encounter for screening mammogram for malignant neoplasm of breast: Secondary | ICD-10-CM | POA: Insufficient documentation

## 2015-08-05 DIAGNOSIS — Z1239 Encounter for other screening for malignant neoplasm of breast: Secondary | ICD-10-CM

## 2015-08-06 ENCOUNTER — Encounter: Payer: Self-pay | Admitting: Family Medicine

## 2015-08-06 ENCOUNTER — Ambulatory Visit (INDEPENDENT_AMBULATORY_CARE_PROVIDER_SITE_OTHER): Payer: Commercial Managed Care - HMO | Admitting: Family Medicine

## 2015-08-06 VITALS — BP 108/60 | HR 88 | Temp 98.6°F | Resp 16 | Ht 62.0 in | Wt 272.0 lb

## 2015-08-06 DIAGNOSIS — G4733 Obstructive sleep apnea (adult) (pediatric): Secondary | ICD-10-CM

## 2015-08-06 DIAGNOSIS — N182 Chronic kidney disease, stage 2 (mild): Secondary | ICD-10-CM

## 2015-08-06 DIAGNOSIS — I868 Varicose veins of other specified sites: Secondary | ICD-10-CM

## 2015-08-06 DIAGNOSIS — J4520 Mild intermittent asthma, uncomplicated: Secondary | ICD-10-CM

## 2015-08-06 DIAGNOSIS — M109 Gout, unspecified: Secondary | ICD-10-CM

## 2015-08-06 DIAGNOSIS — I10 Essential (primary) hypertension: Secondary | ICD-10-CM

## 2015-08-06 DIAGNOSIS — M47817 Spondylosis without myelopathy or radiculopathy, lumbosacral region: Secondary | ICD-10-CM | POA: Diagnosis not present

## 2015-08-06 DIAGNOSIS — E041 Nontoxic single thyroid nodule: Secondary | ICD-10-CM | POA: Diagnosis not present

## 2015-08-06 DIAGNOSIS — I8390 Asymptomatic varicose veins of unspecified lower extremity: Secondary | ICD-10-CM

## 2015-08-06 DIAGNOSIS — E1121 Type 2 diabetes mellitus with diabetic nephropathy: Secondary | ICD-10-CM

## 2015-08-06 LAB — POCT GLYCOSYLATED HEMOGLOBIN (HGB A1C): Hemoglobin A1C: 6.1

## 2015-08-06 LAB — POCT UA - MICROALBUMIN: Microalbumin Ur, POC: NEGATIVE mg/L

## 2015-08-06 MED ORDER — METFORMIN HCL 500 MG PO TABS: 500.0000 mg | ORAL_TABLET | Freq: Two times a day (BID) | ORAL | Status: DC

## 2015-08-06 MED ORDER — POTASSIUM CHLORIDE CRYS ER 20 MEQ PO TBCR: 20.0000 meq | EXTENDED_RELEASE_TABLET | Freq: Every day | ORAL | Status: DC

## 2015-08-06 MED ORDER — GABAPENTIN 300 MG PO CAPS: 300.0000 mg | ORAL_CAPSULE | Freq: Three times a day (TID) | ORAL | Status: DC

## 2015-08-06 MED ORDER — BENAZEPRIL HCL 20 MG PO TABS: 20.0000 mg | ORAL_TABLET | Freq: Every day | ORAL | Status: DC

## 2015-08-06 NOTE — Progress Notes (Signed)
Name: Amber Avery   MRN: [id number]    DOB: 1946/04/04   Date:08/06/2015       Progress Note  Subjective  Chief Complaint  Chief Complaint  Patient presents with  . Medication Refill    follow-up  . Diabetes    checks BG every other day Low-92, High-157  . Hypertension  . Hyperlipidemia    leg cramps takes med every other day  . Gout    HPI  DM with renal manifestation: HgbA1C at goal at 6.1%. Urine micro negative today. She is on metformin. She denies polyphagia, but feels very thirsty at night, no polyuria, but occasionally has one episode of nocturia. Eye exam is up to date.On Ace for CKI used to have proteinuria. Glucose is at goal at home, between 92 -157 and is doing great with a higher dose of Metformin without side effects  Dyslipidemia: taking Atorvastatin, aspirin, history of CAD, no chest pain, no change in exercise tolerance. No palpitation. Reviewed labs and LDL is at goal   HTN: well controlled, taking medication as prescribed, denies side effects. No chest pain, no palpitation, occasionally has mild lower extremity swelling  Gout: no recent episodes, controlled with medications  Obesity: she has gained 2 lbs since last visit, she states she has been eating a little bit more since because of stress. She had a dog for one year but gave it away this past Sunday. She will focus on diet and exercise again  OSA: she wear machine every night, she has a new mask and uses oxygen with CPAP. She feels rested when she gets up.   Right thyroid nodule; seen by Endo at [facility], will have a biopsy done soon  Varicose Veins: present for years and now has pain on left groin and upper thigh, bulging varicose.   Lumbosacral pain: doing well on Gabapentin, seen by [facility] and had injections, she is doing much better now. She also has left hip pain   Asthma: she has some chest congestion, negative work up by cardiologist. She denies wheezing, no decrease exercise tolerance.    08/06/15 1012  Asthma History  Symptoms 0-2 days/week  Nighttime Awakenings 0-2/month  Asthma interference with normal activity No limitations  SABA use (not for EIB) 0-2 days/wk  Risk: Exacerbations requiring oral systemic steroids 0-1 / year  Asthma Severity Intermittent   She states she wants to hold off until thyroid biopsy is done before she goes back on medication for asthma  Patient Active Problem List   Diagnosis Date Noted  . Right thyroid nodule 06/26/2015  . Asthma, intermittent 04/03/2015  . Benign essential HTN 04/03/2015  . Carpal tunnel syndrome 04/03/2015  . Chronic kidney disease (CKD), stage II (mild) 04/03/2015  . Controlled gout 04/03/2015  . Arteriosclerosis of coronary artery 04/03/2015  . Diabetes mellitus with renal manifestations, controlled ([place]) 04/03/2015  . Dyslipidemia 04/03/2015  . Edema extremities 04/03/2015  . Elevated sedimentation rate 04/03/2015  . Knee pain 04/03/2015  . Lumbar radiculitis 04/03/2015  . Obstructive apnea 04/03/2015  . Lumbosacral spondylosis 04/03/2015  . Osteoarthritis, chronic 04/03/2015  . Psoriasis 04/03/2015  . Vitamin D deficiency 04/03/2015  . Varicose veins 04/03/2015  . Goiter 12/23/2014  . Degeneration of intervertebral disc of lumbar region 08/23/2014  . Corns and callosity 06/11/2009  . Extreme obesity ([place]) 04/11/2007    Past Surgical History  Procedure Laterality Date  . Cholecystectomy  1977  . Hemorrhoid banding  1980  . Hernia repair  2011  .  Appendectomy    . Tonsillectomy and adenoidectomy    . Dilation and curettage of uterus      Due to Amenorrhea  . Hemorroidectomy    . Temporal area excision biopsy      For Birth Mark Changes, Negative Pathology    Family History  Problem Relation Age of Onset  . Cancer Mother     brain tumor  . Kidney disease Mother   . Hypertension Mother   . Heart disease Father   . Hypertension Sister   . Arthritis Sister   . Cancer Sister   . Hernia Sister    . GER disease Sister   . Lupus Sister   . Diabetes Sister   . Breast cancer Maternal Aunt 70    Social History   Social History  . Marital Status: Divorced    Spouse Name: N/A  . Number of Children: N/A  . Years of Education: N/A   Occupational History  . Not on file.   Social History Main Topics  . Smoking status: Former Smoker -- 15 years  . Smokeless tobacco: Never Used  . Alcohol Use: 0.0 oz/week    0 Standard drinks or equivalent per week     Comment: occasssionally wine  . Drug Use: No  . Sexual Activity: Not Currently   Other Topics Concern  . Not on file   Social History Narrative     Current outpatient prescriptions:  .  allopurinol (ZYLOPRIM) 300 MG tablet, Take 1 tablet (300 mg total) by mouth daily., Disp: 90 tablet, Rfl: 4 .  aspirin 81 MG tablet, Take 1 tablet by mouth daily., Disp: , Rfl:  .  atorvastatin (LIPITOR) 40 MG tablet, Take 1 tablet by mouth daily., Disp: , Rfl:  .  benazepril (LOTENSIN) 20 MG tablet, Take 1 tablet (20 mg total) by mouth daily., Disp: 90 tablet, Rfl: 3 .  calcipotriene (DOVONOX) 0.005 % cream, Apply 1 application topically at bedtime., Disp: , Rfl:  .  clobetasol ointment (TEMOVATE) 3.64 %, Apply 1 application topically 2 (two) times daily., Disp: , Rfl:  .  colchicine 0.6 MG tablet, Take 1 tablet by mouth 2 (two) times daily., Disp: , Rfl:  .  diclofenac sodium (VOLTAREN) 1 % GEL, Apply topically 4 (four) times daily., Disp: , Rfl:  .  gabapentin (NEURONTIN) 300 MG capsule, Take 1 capsule (300 mg total) by mouth 3 (three) times daily., Disp: 270 capsule, Rfl: 3 .  metFORMIN (GLUCOPHAGE) 500 MG tablet, Take 1 tablet (500 mg total) by mouth 2 (two) times daily., Disp: 270 tablet, Rfl: 3 .  potassium chloride SA (K-DUR,KLOR-CON) 20 MEQ tablet, Take 1-2 tablets (20-40 mEq total) by mouth daily., Disp: 180 tablet, Rfl: 3 .  tiZANidine (ZANAFLEX) 4 MG tablet, Take 1 tablet by mouth 2 (two) times daily., Disp: , Rfl:  .  torsemide  (DEMADEX) 20 MG tablet, Take 1-2 tablets (20-40 mg total) by mouth daily., Disp: 180 tablet, Rfl: 1 .  triamcinolone cream (KENALOG) 0.1 %, Apply 1 application topically as needed., Disp: , Rfl:  .  triamcinolone ointment (KENALOG) 0.1 %, Apply 1 application topically as needed., Disp: , Rfl:  .  Vitamin D, Cholecalciferol, 1000 UNITS TABS, Take 1 capsule by mouth daily., Disp: , Rfl:   Allergies  Allergen Reactions  . Codeine      ROS  Constitutional: Negative for fever or weight change.  Respiratory: Negative for cough and shortness of breath.   Cardiovascular: Negative for chest pain or  palpitations.  Gastrointestinal: Negative for abdominal pain, no bowel changes.  Musculoskeletal: Positive  for gait problem or joint swelling.  Skin: Negative for rash.  Neurological: Negative for dizziness or headache.  No other specific complaints in a complete review of systems (except as listed in HPI above).  Objective  Filed Vitals:   08/06/15 0915  BP: 108/60  Pulse: 88  Temp: 98.6 F (37 C)  TempSrc: Oral  Resp: 16  Height: 5\' 2"  (1.575 m)  Weight: 272 lb (123.378 kg)  SpO2: 94%    Body mass index is 49.74 kg/(m^2).  Physical Exam  Constitutional: Patient appears well-developed  Obese  No distress.  HEENT: head atraumatic, normocephalic, pupils equal and reactive to light, neck supple, throat within normal limits. Thyroid nodule on right side Cardiovascular: Normal rate, regular rhythm and normal heart sounds.  No murmur heard. Trace ankle   edema. Varicose veins worse on left thigh Pulmonary/Chest: Effort normal and breath sounds normal. No respiratory distress. Abdominal: Soft.  There is no tenderness. Psychiatric: Patient has a normal mood and affect. behavior is normal. Judgment and thought content normal. Muscular skeletal: pain with internal rotation of left hip, but she wants to get varicose evaluated first before having x-ray of hip   Recent Results (from the past  2160 hour(s))  POCT UA - Microalbumin     Status: Normal   Collection Time: 08/06/15  9:21 AM  Result Value Ref Range   Microalbumin Ur, POC negative mg/L   Creatinine, POC  mg/dL   Albumin/Creatinine Ratio, Urine, POC    POCT HgB A1C     Status: Abnormal   Collection Time: 08/06/15  9:23 AM  Result Value Ref Range   Hemoglobin A1C 6.1      PHQ2/9: Depression screen [facility] 2/9 06/19/2015 04/04/2015  Decreased Interest 0 0  Down, Depressed, Hopeless 0 1  PHQ - 2 Score 0 1  Altered sleeping - 1  Tired, decreased energy - 0  Change in appetite - 1  Feeling bad or failure about yourself  - 0  Trouble concentrating - 0  Moving slowly or fidgety/restless - 0  Suicidal thoughts - 0  PHQ-9 Score - 3  Difficult doing work/chores - Not difficult at all     Fall Risk: Fall Risk  06/19/2015 04/04/2015  Falls in the past year? No No     Assessment & Plan  1. Type 2 diabetes mellitus with diabetic nephropathy, without long-term current use of insulin (HCC)  At goal , continue ace, urine micro is back to normal  - POCT UA - Microalbumin - POCT HgB A1C - metFORMIN (GLUCOPHAGE) 500 MG tablet; Take 1 tablet (500 mg total) by mouth 2 (two) times daily.  Dispense: 270 tablet; Refill: 3  2. Morbid obesity due to excess calories [facility])  Discussed with the patient the risk posed by an increased BMI. Discussed importance of portion control, calorie counting and at least 150 minutes of physical activity weekly. Avoid sweet beverages and drink more water. Eat at least 6 servings of fruit and vegetables daily   3. Chronic kidney disease (CKD), stage II (mild)  stable  4. Benign essential HTN   at goal  - benazepril (LOTENSIN) 20 MG tablet; Take 1 tablet (20 mg total) by mouth daily.  Dispense: 90 tablet; Refill: 3 - potassium chloride SA (K-DUR,KLOR-CON) 20 MEQ tablet; Take 1-2 tablets (20-40 mEq total) by mouth daily.  Dispense: 180 tablet; Refill: 3  5. Obstructive apnea  Continue CPAP  machine  6. Asthma, intermittent, uncomplicated  Not on medication, some chest congestion, diagnosed years ago, may have been mild COPD. She does not want to have spirometry today  7. Lumbosacral spondylosis  Doing better - gabapentin (NEURONTIN) 300 MG capsule; Take 1 capsule (300 mg total) by mouth 3 (three) times daily.  Dispense: 270 capsule; Refill: 3  8. Varicose veins  - Ambulatory referral to Vascular Surgery

## 2015-08-06 NOTE — Progress Notes (Signed)
   08/06/15 1012  Asthma History  Symptoms 0-2 days/week  Nighttime Awakenings 0-2/month  Asthma interference with normal activity No limitations  SABA use (not for EIB) 0-2 days/wk  Risk: Exacerbations requiring oral systemic steroids 0-1 / year  Asthma Severity Intermittent

## 2015-08-07 ENCOUNTER — Other Ambulatory Visit: Payer: Self-pay

## 2015-08-07 ENCOUNTER — Telehealth: Payer: Self-pay

## 2015-08-07 DIAGNOSIS — E1129 Type 2 diabetes mellitus with other diabetic kidney complication: Secondary | ICD-10-CM

## 2015-08-07 MED ORDER — GLUCOSE BLOOD VI STRP: ORAL_STRIP | Status: DC

## 2015-08-07 NOTE — Telephone Encounter (Signed)
Called pt to notify her of appt at [facility], she is aware of appt. She also mentioned to me that she forgot to mention to Dr. Ancil Boozer that she needs her diabetes testing supplies. She test her Blood glucose every other day and uses the [facility]ocate machine. Please send to [facility] Thanks

## 2015-08-07 NOTE — Telephone Encounter (Signed)
Patient requesting refill. 

## 2015-08-08 ENCOUNTER — Other Ambulatory Visit: Payer: Self-pay | Admitting: Family Medicine

## 2015-08-08 DIAGNOSIS — E1121 Type 2 diabetes mellitus with diabetic nephropathy: Secondary | ICD-10-CM

## 2015-08-08 MED ORDER — GLUCOSE BLOOD VI STRP: ORAL_STRIP | Status: DC

## 2015-08-08 MED ORDER — ADVOCATE LANCETS 30G MISC: 1.0000 | Freq: Two times a day (BID) | Status: DC | PRN

## 2015-08-09 ENCOUNTER — Telehealth: Payer: Self-pay

## 2015-08-09 DIAGNOSIS — E1129 Type 2 diabetes mellitus with other diabetic kidney complication: Secondary | ICD-10-CM

## 2015-08-09 MED ORDER — TRUE METRIX AIR GLUCOSE METER W/DEVICE KIT: 1.0000 | PACK | Status: DC

## 2015-08-09 MED ORDER — GLUCOSE BLOOD VI STRP: ORAL_STRIP | Status: DC

## 2015-08-09 MED ORDER — TRUEPLUS LANCETS 30G MISC: 100.0000 | Status: DC

## 2015-08-09 NOTE — Telephone Encounter (Signed)
Patient is requesting refill of True Metrix Meter. I called [facility] and had them cancel the Advocate test supplies and replace them with the True Metrix. Everything was order over the phone but put in our system so we will have record of what was ordered today: True Metrix Meter, True Metrix Test Strips, and True Plus Ultra Thin 30g Lancets with a 90 day supply with 3 refills on 08/09/15 at 10:00 a.m.

## 2015-08-14 ENCOUNTER — Other Ambulatory Visit: Payer: Self-pay | Admitting: Family Medicine

## 2015-08-14 NOTE — Telephone Encounter (Signed)
Sent to Dr. Sowles  

## 2015-08-21 ENCOUNTER — Other Ambulatory Visit: Payer: Self-pay | Admitting: Family Medicine

## 2015-09-03 ENCOUNTER — Other Ambulatory Visit: Payer: Self-pay | Admitting: Internal Medicine

## 2015-09-03 DIAGNOSIS — E042 Nontoxic multinodular goiter: Secondary | ICD-10-CM

## 2015-09-05 ENCOUNTER — Ambulatory Visit
Admission: RE | Admit: 2015-09-05 | Discharge: 2015-09-05 | Disposition: A | Discharge status: Home / Self Care | Payer: Commercial Managed Care - HMO | Source: Ambulatory Visit | Attending: Internal Medicine | Admitting: Internal Medicine

## 2015-09-05 DIAGNOSIS — E041 Nontoxic single thyroid nodule: Secondary | ICD-10-CM | POA: Insufficient documentation

## 2015-09-05 DIAGNOSIS — E042 Nontoxic multinodular goiter: Secondary | ICD-10-CM

## 2015-09-05 NOTE — Procedures (Signed)
US guided left thyroid nodule biopsy. No immediate complication.

## 2015-09-06 LAB — CYTOLOGY - NON PAP

## 2015-09-16 ENCOUNTER — Telehealth: Payer: Self-pay | Admitting: Family Medicine

## 2015-09-16 DIAGNOSIS — N182 Chronic kidney disease, stage 2 (mild): Secondary | ICD-10-CM

## 2015-09-16 DIAGNOSIS — E049 Nontoxic goiter, unspecified: Secondary | ICD-10-CM

## 2015-09-16 DIAGNOSIS — R700 Elevated erythrocyte sedimentation rate: Secondary | ICD-10-CM

## 2015-09-16 DIAGNOSIS — E559 Vitamin D deficiency, unspecified: Secondary | ICD-10-CM

## 2015-09-16 DIAGNOSIS — E785 Hyperlipidemia, unspecified: Secondary | ICD-10-CM

## 2015-09-16 DIAGNOSIS — E0821 Diabetes mellitus due to underlying condition with diabetic nephropathy: Secondary | ICD-10-CM

## 2015-09-16 DIAGNOSIS — I10 Essential (primary) hypertension: Secondary | ICD-10-CM

## 2015-09-16 NOTE — Telephone Encounter (Signed)
Last seen on October 18th and next appointment is for February. She is requesting that you please print her a lab order so she can get lab results due to insurance will not be covering it in 2017 (because she is a [employer] retiree) . Is requesting kidney function, potassium level, A1C, urine protein, and is also needing tetanus booster. She found this information on the [employer]. And if there is anything else that you would like to order she will be glad to have it drawn before next year.

## 2015-09-17 NOTE — Telephone Encounter (Signed)
Tetanus is up to date I will order labs

## 2015-09-17 NOTE — Telephone Encounter (Signed)
Patient notified

## 2015-09-18 ENCOUNTER — Other Ambulatory Visit: Payer: Self-pay | Admitting: Family Medicine

## 2015-09-19 LAB — LIPID PANEL
Chol/HDL Ratio: 2.7 ratio (ref 0.0–4.4)
Cholesterol, Total: 134 mg/dL (ref 100–199)
HDL: 50 mg/dL
LDL Calculated: 56 mg/dL (ref 0–99)
Triglycerides: 138 mg/dL (ref 0–149)
VLDL Cholesterol Cal: 28 mg/dL (ref 5–40)

## 2015-09-19 LAB — CBC WITH DIFFERENTIAL/PLATELET
Basophils Absolute: 0 10*3/uL (ref 0.0–0.2)
Basos: 1 %
EOS (ABSOLUTE): 0.2 10*3/uL (ref 0.0–0.4)
Eos: 3 %
Hematocrit: 36.1 % (ref 34.0–46.6)
Hemoglobin: 11.8 g/dL (ref 11.1–15.9)
Immature Grans (Abs): 0 10*3/uL (ref 0.0–0.1)
Immature Granulocytes: 0 %
Lymphocytes Absolute: 1.9 10*3/uL (ref 0.7–3.1)
Lymphs: 21 %
MCH: 29.4 pg (ref 26.6–33.0)
MCHC: 32.7 g/dL (ref 31.5–35.7)
MCV: 90 fL (ref 79–97)
Monocytes Absolute: 0.6 10*3/uL (ref 0.1–0.9)
Monocytes: 7 %
Neutrophils Absolute: 6.1 10*3/uL (ref 1.4–7.0)
Neutrophils: 68 %
Platelets: 288 10*3/uL (ref 150–379)
RBC: 4.02 x10E6/uL (ref 3.77–5.28)
RDW: 15.8 % — ABNORMAL HIGH (ref 12.3–15.4)
WBC: 8.8 10*3/uL (ref 3.4–10.8)

## 2015-09-19 LAB — COMPREHENSIVE METABOLIC PANEL WITH GFR
ALT: 16 [IU]/L (ref 0–32)
AST: 14 [IU]/L (ref 0–40)
Albumin/Globulin Ratio: 2 (ref 1.1–2.5)
Albumin: 4.3 g/dL (ref 3.6–4.8)
Alkaline Phosphatase: 115 [IU]/L (ref 39–117)
BUN/Creatinine Ratio: 34 — ABNORMAL HIGH (ref 11–26)
BUN: 37 mg/dL — ABNORMAL HIGH (ref 8–27)
Bilirubin Total: 0.5 mg/dL (ref 0.0–1.2)
CO2: 23 mmol/L (ref 18–29)
Calcium: 9.8 mg/dL (ref 8.7–10.3)
Chloride: 101 mmol/L (ref 97–106)
Creatinine, Ser: 1.09 mg/dL — ABNORMAL HIGH (ref 0.57–1.00)
GFR calc Af Amer: 60 mL/min/{1.73_m2}
GFR calc non Af Amer: 52 mL/min/{1.73_m2} — ABNORMAL LOW
Globulin, Total: 2.1 g/dL (ref 1.5–4.5)
Glucose: 107 mg/dL — ABNORMAL HIGH (ref 65–99)
Potassium: 4.8 mmol/L (ref 3.5–5.2)
Sodium: 141 mmol/L (ref 136–144)
Total Protein: 6.4 g/dL (ref 6.0–8.5)

## 2015-09-19 LAB — VITAMIN D 25 HYDROXY (VIT D DEFICIENCY, FRACTURES): Vit D, 25-Hydroxy: 35.9 ng/mL (ref 30.0–100.0)

## 2015-09-19 LAB — TSH: TSH: 4.38 u[IU]/mL (ref 0.450–4.500)

## 2015-09-25 NOTE — Progress Notes (Signed)
Patient notified

## 2015-11-20 ENCOUNTER — Other Ambulatory Visit: Payer: Self-pay | Admitting: Family Medicine

## 2015-12-09 ENCOUNTER — Ambulatory Visit (INDEPENDENT_AMBULATORY_CARE_PROVIDER_SITE_OTHER): Payer: Commercial Managed Care - HMO | Admitting: Family Medicine

## 2015-12-09 ENCOUNTER — Encounter: Payer: Self-pay | Admitting: Family Medicine

## 2015-12-09 VITALS — BP 142/64 | HR 85 | Temp 97.9°F | Resp 18 | Ht 62.0 in | Wt 280.2 lb

## 2015-12-09 DIAGNOSIS — E1129 Type 2 diabetes mellitus with other diabetic kidney complication: Secondary | ICD-10-CM

## 2015-12-09 DIAGNOSIS — Z1211 Encounter for screening for malignant neoplasm of colon: Secondary | ICD-10-CM | POA: Diagnosis not present

## 2015-12-09 DIAGNOSIS — I10 Essential (primary) hypertension: Secondary | ICD-10-CM | POA: Diagnosis not present

## 2015-12-09 DIAGNOSIS — E049 Nontoxic goiter, unspecified: Secondary | ICD-10-CM | POA: Diagnosis not present

## 2015-12-09 DIAGNOSIS — N183 Chronic kidney disease, stage 3 unspecified: Secondary | ICD-10-CM

## 2015-12-09 DIAGNOSIS — R600 Localized edema: Secondary | ICD-10-CM | POA: Diagnosis not present

## 2015-12-09 DIAGNOSIS — E785 Hyperlipidemia, unspecified: Secondary | ICD-10-CM | POA: Diagnosis not present

## 2015-12-09 DIAGNOSIS — G4733 Obstructive sleep apnea (adult) (pediatric): Secondary | ICD-10-CM | POA: Diagnosis not present

## 2015-12-09 LAB — POCT GLYCOSYLATED HEMOGLOBIN (HGB A1C): Hemoglobin A1C: 6.7

## 2015-12-09 MED ORDER — FUROSEMIDE 40 MG PO TABS: 40.0000 mg | ORAL_TABLET | Freq: Two times a day (BID) | ORAL | Status: DC | PRN

## 2015-12-09 MED ORDER — BENAZEPRIL HCL 20 MG PO TABS: 20.0000 mg | ORAL_TABLET | Freq: Two times a day (BID) | ORAL | Status: DC

## 2015-12-09 NOTE — Progress Notes (Signed)
Name: Amber Avery   MRN: [id number]    DOB: 1945/11/16   Date:12/09/2015       Progress Note  Subjective  Chief Complaint  Chief Complaint  Patient presents with  . Medication Refill    4 month F/U  . Diabetes    Checks BS bid, low-90's average-150 high-200's, feels like it is due to stress with her sister Stanton Kidney having to start cancer treatments  . Hyperlipidemia  . Gout    Has been well controlled, has not had a flair up in a long time  . Asthma  . Hypertension    Edema in bilateral feet, hurts when you touch the top of her left foot. Headaches  . Stress    sister diagnosed with multiple myeloma    HPI  DM with renal manifestation: HgbA1C at goal at 6.7%. She is on metformin. She denies polyphagia, polydipsia or  polyuria, but occasionally has one episode of nocturia. Eye exam is up to date.On Ace for CKI used to have proteinuria. Glucose is at goal at home has been at goal - average of 150's  Dyslipidemia: taking Atorvastatin, aspirin, history of CAD, no chest pain, no change in exercise tolerance. No palpitation. Reviewed labs and LDL is at goal   HTN: bp is up for the second time in a row, taking medication as prescribed, denies side effects. No chest pain, no palpitation, occasionally has mild lower extremity swelling  Obesity: She states she has been eating a little bit more since because of stress. Sister diagnosed with Multiple Myeloma  OSA: she wear machine every night, she has a new mask and uses oxygen with CPAP. She feels rested when she gets up.   Right thyroid nodule; seen by Endo at [facility], biopsy done and negative, last TSH was towards upper limits of normal   Varicose Veins: present for years and now has pain on left groin and upper thigh, bulging varicose. Seen by Vascular Surgeon but procedure will not be covered and she can't afford it   Lumbosacral pain: doing well on Gabapentin, seen by [facility] and had injections, she is doing much better now. She also  has left hip pain , but under control , pain at this time is zero.   Patient Active Problem List   Diagnosis Date Noted  . Right thyroid nodule 06/26/2015  . Asthma, intermittent 04/03/2015  . Benign essential HTN 04/03/2015  . Carpal tunnel syndrome 04/03/2015  . Chronic kidney disease (CKD), stage II (mild) 04/03/2015  . Controlled gout 04/03/2015  . Arteriosclerosis of coronary artery 04/03/2015  . Diabetes mellitus with renal manifestations, controlled ([place]) 04/03/2015  . Dyslipidemia 04/03/2015  . Edema extremities 04/03/2015  . Elevated sedimentation rate 04/03/2015  . Knee pain 04/03/2015  . Lumbar radiculitis 04/03/2015  . Obstructive apnea 04/03/2015  . Lumbosacral spondylosis 04/03/2015  . Osteoarthritis, chronic 04/03/2015  . Psoriasis 04/03/2015  . Vitamin D deficiency 04/03/2015  . Varicose veins 04/03/2015  . Goiter 12/23/2014  . Degeneration of intervertebral disc of lumbar region 08/23/2014  . Corns and callosity 06/11/2009  . Extreme obesity ([place]) 04/11/2007    Past Surgical History  Procedure Laterality Date  . Cholecystectomy  1977  . Hemorrhoid banding  1980  . Hernia repair  2011  . Appendectomy    . Tonsillectomy and adenoidectomy    . Dilation and curettage of uterus      Due to Amenorrhea  . Hemorroidectomy    . Temporal area excision biopsy  For [employer] Changes, Negative Pathology    Family History  Problem Relation Age of Onset  . Cancer Mother     brain tumor  . Kidney disease Mother   . Hypertension Mother   . Heart disease Father   . Hypertension Sister   . Arthritis Sister   . Cancer Sister   . Hernia Sister   . GER disease Sister   . Lupus Sister   . Diabetes Sister   . Breast cancer Maternal Aunt 70    Social History   Social History  . Marital Status: Divorced    Spouse Name: N/A  . Number of Children: N/A  . Years of Education: N/A   Occupational History  . Not on file.   Social History Main Topics  .  Smoking status: Former Smoker -- 15 years  . Smokeless tobacco: Never Used  . Alcohol Use: 0.0 oz/week    0 Standard drinks or equivalent per week     Comment: occasssionally wine  . Drug Use: No  . Sexual Activity: Not Currently   Other Topics Concern  . Not on file   Social History Narrative     Current outpatient prescriptions:  .  allopurinol (ZYLOPRIM) 300 MG tablet, Take 1 tablet (300 mg total) by mouth daily., Disp: 90 tablet, Rfl: 4 .  aspirin 81 MG tablet, Take 1 tablet by mouth daily., Disp: , Rfl:  .  atorvastatin (LIPITOR) 40 MG tablet, TAKE 1 TABLET EVERY EVENING FOR CHOLESTEROL  (REPLACES  SIMVASTATIN), Disp: 90 tablet, Rfl: 3 .  benazepril (LOTENSIN) 20 MG tablet, Take 1 tablet (20 mg total) by mouth 2 (two) times daily., Disp: 180 tablet, Rfl: 1 .  Biotin 5000 MCG CAPS, Take 1 capsule by mouth daily., Disp: , Rfl:  .  Blood Glucose Monitoring Suppl (TRUE METRIX AIR GLUCOSE METER) W/DEVICE KIT, 1 each by Does not apply route 1 day or 1 dose., Disp: 1 kit, Rfl: 0 .  calcipotriene (DOVONOX) 0.005 % cream, Apply 1 application topically at bedtime., Disp: , Rfl:  .  clobetasol ointment (TEMOVATE) 8.29 %, Apply 1 application topically 2 (two) times daily., Disp: , Rfl:  .  colchicine 0.6 MG tablet, Take 1 tablet by mouth 2 (two) times daily., Disp: , Rfl:  .  diclofenac sodium (VOLTAREN) 1 % GEL, Apply topically 4 (four) times daily., Disp: , Rfl:  .  gabapentin (NEURONTIN) 300 MG capsule, Take 1 capsule (300 mg total) by mouth 3 (three) times daily., Disp: 270 capsule, Rfl: 3 .  glucose blood (TRUE METRIX BLOOD GLUCOSE TEST) test strip, Use as instructed, Disp: 100 each, Rfl: 12 .  metFORMIN (GLUCOPHAGE) 500 MG tablet, TAKE 1 TABLET TWICE DAILY, Disp: 180 tablet, Rfl: 1 .  potassium chloride SA (K-DUR,KLOR-CON) 20 MEQ tablet, Take 1-2 tablets (20-40 mEq total) by mouth daily., Disp: 180 tablet, Rfl: 3 .  tiZANidine (ZANAFLEX) 4 MG tablet, Take 1 tablet by mouth 2 (two) times  daily., Disp: , Rfl:  .  torsemide (DEMADEX) 20 MG tablet, TAKE 1 TO 2 TABLETS EVERY DAY, Disp: 180 tablet, Rfl: 1 .  triamcinolone cream (KENALOG) 0.1 %, Apply 1 application topically as needed., Disp: , Rfl:  .  triamcinolone ointment (KENALOG) 0.1 %, APPLY A PEA SIZE AMOUNT EXTERNALLY TO AREA EVERY OTHER DAY , Disp: 90 g, Rfl: 1 .  TRUEPLUS LANCETS 30G MISC, 100 each by Does not apply route 1 day or 1 dose., Disp: 100 each, Rfl: 12 .  Vitamin  D, Cholecalciferol, 1000 UNITS TABS, Take 1 capsule by mouth daily., Disp: , Rfl:   Allergies  Allergen Reactions  . Codeine      ROS  Constitutional: Negative for fever , positive for  weight change.  Respiratory: Negative for cough and shortness of breath.   Cardiovascular: Negative for chest pain or palpitations.  Gastrointestinal: Negative for abdominal pain, no bowel changes.  Musculoskeletal: Positive for gait problem - uses a cane no  joint swelling.  Skin: Positive  for rash - psoriasis - on her ankles.  Neurological: Negative for dizziness or headache.  No other specific complaints in a complete review of systems (except as listed in HPI above).  Objective  Filed Vitals:   12/09/15 0910  BP: 142/64  Pulse: 85  Temp: 97.9 F (36.6 C)  TempSrc: Oral  Resp: 18  Height: 5' 2" (1.575 m)  Weight: 280 lb 3.2 oz (127.098 kg)  SpO2: 95%    Body mass index is 51.24 kg/(m^2).  Physical Exam  Constitutional: Patient appears well-developed . Obese  No distress.  HEENT: head atraumatic, normocephalic, pupils equal and reactive to light, neck supple, throat within normal limits Cardiovascular: Normal rate, regular rhythm and normal heart sounds.  No murmur heard. Positive 1 plus BLE edema. Pulmonary/Chest: Effort normal and breath sounds normal. No respiratory distress. Abdominal: Soft.  There is no tenderness. Psychiatric: Patient has a normal mood and affect. behavior is normal. Judgment and thought content normal.  Recent  Results (from the past 2160 hour(s))  CBC with Differential/Platelet     Status: Abnormal   Collection Time: 09/18/15 11:39 AM  Result Value Ref Range   WBC 8.8 3.4 - 10.8 x10E3/uL   RBC 4.02 3.77 - 5.28 x10E6/uL   Hemoglobin 11.8 11.1 - 15.9 g/dL   Hematocrit 36.1 34.0 - 46.6 %   MCV 90 79 - 97 fL   MCH 29.4 26.6 - 33.0 pg   MCHC 32.7 31.5 - 35.7 g/dL   RDW 15.8 (H) 12.3 - 15.4 %   Platelets 288 150 - 379 x10E3/uL   Neutrophils 68 %   Lymphs 21 %   Monocytes 7 %   Eos 3 %   Basos 1 %   Neutrophils Absolute 6.1 1.4 - 7.0 x10E3/uL   Lymphocytes Absolute 1.9 0.7 - 3.1 x10E3/uL   Monocytes Absolute 0.6 0.1 - 0.9 x10E3/uL   EOS (ABSOLUTE) 0.2 0.0 - 0.4 x10E3/uL   Basophils Absolute 0.0 0.0 - 0.2 x10E3/uL   Immature Granulocytes 0 %   Immature Grans (Abs) 0.0 0.0 - 0.1 x10E3/uL  Comprehensive metabolic panel     Status: Abnormal   Collection Time: 09/18/15 11:39 AM  Result Value Ref Range   Glucose 107 (H) 65 - 99 mg/dL   BUN 37 (H) 8 - 27 mg/dL   Creatinine, Ser 1.09 (H) 0.57 - 1.00 mg/dL   GFR calc non Af Amer 52 (L) >59 mL/min/1.73   GFR calc Af Amer 60 >59 mL/min/1.73   BUN/Creatinine Ratio 34 (H) 11 - 26   Sodium 141 136 - 144 mmol/L    Comment: **Effective September 30, 2015 the reference interval**   for Sodium, Serum will be changing to:                                             134 - 144    Potassium 4.8  3.5 - 5.2 mmol/L   Chloride 101 97 - 106 mmol/L    Comment: **Effective September 30, 2015 the reference interval**   for Chloride, Serum will be changing to:                                              96 - 106    CO2 23 18 - 29 mmol/L   Calcium 9.8 8.7 - 10.3 mg/dL   Total Protein 6.4 6.0 - 8.5 g/dL   Albumin 4.3 3.6 - 4.8 g/dL   Globulin, Total 2.1 1.5 - 4.5 g/dL   Albumin/Globulin Ratio 2.0 1.1 - 2.5   Bilirubin Total 0.5 0.0 - 1.2 mg/dL   Alkaline Phosphatase 115 39 - 117 IU/L   AST 14 0 - 40 IU/L   ALT 16 0 - 32 IU/L  Lipid panel     Status: None    Collection Time: 09/18/15 11:39 AM  Result Value Ref Range   Cholesterol, Total 134 100 - 199 mg/dL   Triglycerides 138 0 - 149 mg/dL   HDL 50 >39 mg/dL   VLDL Cholesterol Cal 28 5 - 40 mg/dL   LDL Calculated 56 0 - 99 mg/dL   Chol/HDL Ratio 2.7 0.0 - 4.4 ratio units    Comment:                                   T. Chol/HDL Ratio                                             Men  Women                               1/2 Avg.Risk  3.4    3.3                                   Avg.Risk  5.0    4.4                                2X Avg.Risk  9.6    7.1                                3X Avg.Risk 23.4   11.0   TSH     Status: None   Collection Time: 09/18/15 11:39 AM  Result Value Ref Range   TSH 4.380 0.450 - 4.500 uIU/mL  VITAMIN D 25 Hydroxy (Vit-D Deficiency, Fractures)     Status: None   Collection Time: 09/18/15 11:39 AM  Result Value Ref Range   Vit D, 25-Hydroxy 35.9 30.0 - 100.0 ng/mL    Comment: Vitamin D deficiency has been defined by the [place] practice guideline as a level of serum 25-OH vitamin D less than 20 ng/mL (1,2). The Endocrine Society went on to further define vitamin D insufficiency as a level between 21 and 29 ng/mL (  2). 1. IOM (Institute of Medicine). 2010. Dietary reference    intakes for calcium and D. [place]: The    [employer]. 2. Holick MF, Binkley [place], Bischoff-Ferrari HA, et al.    Evaluation, treatment, and prevention of vitamin D    deficiency: an Endocrine Society clinical practice    guideline. JCEM. 2011 Jul; 96(7):1911-30.   POCT HgB A1C     Status: None   Collection Time: 12/09/15  9:20 AM  Result Value Ref Range   Hemoglobin A1C 6.7     PHQ2/9: Depression screen [facility] 2/9 12/09/2015 06/19/2015 04/04/2015  Decreased Interest 0 0 0  Down, Depressed, Hopeless 0 0 1  PHQ - 2 Score 0 0 1  Altered sleeping - - 1  Tired, decreased energy - - 0  Change in appetite - - 1  Feeling bad or  failure about yourself  - - 0  Trouble concentrating - - 0  Moving slowly or fidgety/restless - - 0  Suicidal thoughts - - 0  PHQ-9 Score - - 3  Difficult doing work/chores - - Not difficult at all    Fall Risk: Fall Risk  12/09/2015 06/19/2015 04/04/2015  Falls in the past year? No No No     Functional Status Survey: Is the patient deaf or have difficulty hearing?: No Does the patient have difficulty seeing, even when wearing glasses/contacts?: No Does the patient have difficulty concentrating, remembering, or making decisions?: No Does the patient have difficulty walking or climbing stairs?: Yes (walks with a cane) Does the patient have difficulty dressing or bathing?: No Does the patient have difficulty doing errands alone such as visiting a doctor's office or shopping?: No   Assessment & Plan  1. 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  Dispense: 180 tablet; Refill: 1  2. Benign essential HTN  Adjust dose of Benazepril, bp has been running higher than normal for her - benazepril (LOTENSIN) 20 MG tablet; Take 1 tablet (20 mg total) by mouth 2 (two) times daily.  Dispense: 180 tablet; Refill: 1  3. Dyslipidemia  Continue statin therapy, no side effects  4. Chronic kidney disease, stage III (moderate)  - Estimated GFR - benazepril (LOTENSIN) 20 MG tablet; Take 1 tablet (20 mg total) by mouth 2 (two) times daily.  Dispense: 180 tablet; Refill: 1  5. Obstructive apnea  Continue CPAP every night  6. Morbid obesity due to excess calories (HCC)  Discussed diet and exercise  7. Goiter  - TSH - biopsy negative  8. Colon cancer screening  - Ambulatory referral to Gastroenterology  9. Bilateral edema of lower extremity  She has been on Demadex and is no longer working, we  will change to Furosemide, advised to elevated legs and try to take it only once daily  - furosemide (LASIX) 40 MG tablet; Take 1 tablet (40 mg total) by mouth 2 (two) times daily as needed.  Dispense: 180 tablet; Refill: 0

## 2015-12-10 ENCOUNTER — Other Ambulatory Visit: Payer: Self-pay | Admitting: Family Medicine

## 2015-12-10 LAB — GLOM FILT RATE, ESTIMATED
Creatinine, Ser: 1.13 mg/dL — ABNORMAL HIGH (ref 0.57–1.00)
GFR calc Af Amer: 58 mL/min/{1.73_m2} — ABNORMAL LOW
GFR calc non Af Amer: 50 mL/min/{1.73_m2} — ABNORMAL LOW

## 2015-12-10 LAB — TSH: TSH: 4.4 u[IU]/mL (ref 0.450–4.500)

## 2015-12-11 ENCOUNTER — Telehealth: Payer: Self-pay

## 2015-12-11 ENCOUNTER — Other Ambulatory Visit: Payer: Self-pay

## 2015-12-11 NOTE — Telephone Encounter (Signed)
Gastroenterology Pre-Procedure Review  Request Date: 01/21/16 Requesting Physician: Dr. Ancil Boozer  PATIENT REVIEW QUESTIONS: The patient responded to the following health history questions as indicated:    1. Are you having any GI issues? no 2. Do you have a personal history of Polyps? yes (hyperplastic) 3. Do you have a family history of Colon Cancer or Polyps? no 4. Diabetes Mellitus? yes (Type 2) 5. Joint replacements in the past 12 months?no 6. Major health problems in the past 3 months?no 7. Any artificial heart valves, MVP, or defibrillator?no    MEDICATIONS & ALLERGIES:    Patient reports the following regarding taking any anticoagulation/antiplatelet therapy:   Plavix, Coumadin, Eliquis, Xarelto, Lovenox, Pradaxa, Brilinta, or Effient? no Aspirin? yes (ASA 44m)  Patient confirms/reports the following medications:  Current Outpatient Prescriptions  Medication Sig Dispense Refill  . allopurinol (ZYLOPRIM) 300 MG tablet Take 1 tablet (300 mg total) by mouth daily. 90 tablet 4  . aspirin 81 MG tablet Take 1 tablet by mouth daily.    .[name] Kitchenatorvastatin (LIPITOR) 40 MG tablet TAKE 1 TABLET EVERY EVENING FOR CHOLESTEROL  (REPLACES  SIMVASTATIN) 90 tablet 3  . benazepril (LOTENSIN) 20 MG tablet Take 1 tablet (20 mg total) by mouth 2 (two) times daily. 180 tablet 1  . Biotin 5000 MCG CAPS Take 1 capsule by mouth daily.    . Blood Glucose Monitoring Suppl (TRUE METRIX AIR GLUCOSE METER) W/DEVICE KIT 1 each by Does not apply route 1 day or 1 dose. 1 kit 0  . calcipotriene (DOVONOX) 0.005 % cream Apply 1 application topically at bedtime.    . clobetasol ointment (TEMOVATE) 08.29% Apply 1 application topically 2 (two) times daily.    . colchicine 0.6 MG tablet Take 1 tablet by mouth 2 (two) times daily.    . diclofenac sodium (VOLTAREN) 1 % GEL Apply topically 4 (four) times daily.    . furosemide (LASIX) 40 MG tablet Take 1 tablet (40 mg total) by mouth 2 (two) times daily as needed. 180 tablet  0  . gabapentin (NEURONTIN) 300 MG capsule Take 1 capsule (300 mg total) by mouth 3 (three) times daily. 270 capsule 3  . glucose blood (TRUE METRIX BLOOD GLUCOSE TEST) test strip Use as instructed 100 each 12  . metFORMIN (GLUCOPHAGE) 500 MG tablet TAKE 1 TABLET TWICE DAILY 180 tablet 1  . potassium chloride SA (K-DUR,KLOR-CON) 20 MEQ tablet Take 1-2 tablets (20-40 mEq total) by mouth daily. 180 tablet 3  . tiZANidine (ZANAFLEX) 4 MG tablet Take 1 tablet by mouth 2 (two) times daily.    .[name] Kitchentriamcinolone cream (KENALOG) 0.1 % Apply 1 application topically as needed.    . triamcinolone ointment (KENALOG) 0.1 % APPLY A PEA SIZE AMOUNT EXTERNALLY TO AREA EVERY OTHER DAY  90 g 1  . TRUEPLUS LANCETS 30G MISC 100 each by Does not apply route 1 day or 1 dose. 100 each 12  . Vitamin D, Cholecalciferol, 1000 UNITS TABS Take 1 capsule by mouth daily.     No current facility-administered medications for this visit.    Patient confirms/reports the following allergies:  Allergies  Allergen Reactions  . Codeine     No orders of the defined types were placed in this encounter.    AUTHORIZATION INFORMATION Primary Insurance: 1D#: Group #:  Secondary Insurance: 1D#: Group #:  SCHEDULE INFORMATION: Date: 01/21/16 Time: [place]: [place]

## 2015-12-20 ENCOUNTER — Encounter: Payer: Self-pay | Admitting: Family Medicine

## 2015-12-20 ENCOUNTER — Other Ambulatory Visit: Payer: Self-pay | Admitting: Family Medicine

## 2015-12-20 MED ORDER — METFORMIN HCL 500 MG PO TABS: 500.0000 mg | ORAL_TABLET | Freq: Two times a day (BID) | ORAL | Status: DC

## 2016-01-17 ENCOUNTER — Telehealth: Payer: Self-pay | Admitting: Family Medicine

## 2016-01-17 NOTE — Telephone Encounter (Signed)
The only other option is lyrica and it is not tier 1 or 2. We can increase dose of gabapentin, or she can try taking one more capsule per day

## 2016-01-17 NOTE — Telephone Encounter (Signed)
Patient taking gabapentin 300mg  capsules for her hip. Feels as though it is no longer working cause she has been on it so long. From the hip to knee feels really hot and get numb. Requesting a prescription for something new, It has to be a 1 or 2 tier prescripton please send to [facility]. 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

## 2016-01-20 ENCOUNTER — Encounter: Payer: Self-pay | Admitting: *Deleted

## 2016-01-20 NOTE — Telephone Encounter (Signed)
Patient stated that she has only been taking it bid but will do 1 in the morning and 2 at night since that is when she has the most pain. Patient was encouraged to keep [place] informed and let us know if she needed to increase the dosage.

## 2016-01-21 ENCOUNTER — Ambulatory Visit
Admission: RE | Admit: 2016-01-21 | Discharge: 2016-01-21 | Disposition: A | Discharge status: Home / Self Care | Payer: Commercial Managed Care - HMO | Source: Ambulatory Visit | Attending: Gastroenterology | Admitting: Gastroenterology

## 2016-01-21 ENCOUNTER — Encounter
Admission: RE | Disposition: A | Discharge status: Home / Self Care | Payer: Self-pay | Source: Ambulatory Visit | Attending: Gastroenterology

## 2016-01-21 ENCOUNTER — Ambulatory Visit: Payer: Commercial Managed Care - HMO | Admitting: Anesthesiology

## 2016-01-21 DIAGNOSIS — G4730 Sleep apnea, unspecified: Secondary | ICD-10-CM | POA: Diagnosis not present

## 2016-01-21 DIAGNOSIS — Z79899 Other long term (current) drug therapy: Secondary | ICD-10-CM | POA: Diagnosis not present

## 2016-01-21 DIAGNOSIS — Z885 Allergy status to narcotic agent status: Secondary | ICD-10-CM | POA: Diagnosis not present

## 2016-01-21 DIAGNOSIS — Z833 Family history of diabetes mellitus: Secondary | ICD-10-CM | POA: Insufficient documentation

## 2016-01-21 DIAGNOSIS — E785 Hyperlipidemia, unspecified: Secondary | ICD-10-CM | POA: Insufficient documentation

## 2016-01-21 DIAGNOSIS — K5730 Diverticulosis of large intestine without perforation or abscess without bleeding: Secondary | ICD-10-CM | POA: Diagnosis not present

## 2016-01-21 DIAGNOSIS — E119 Type 2 diabetes mellitus without complications: Secondary | ICD-10-CM | POA: Diagnosis not present

## 2016-01-21 DIAGNOSIS — Z9889 Other specified postprocedural states: Secondary | ICD-10-CM | POA: Insufficient documentation

## 2016-01-21 DIAGNOSIS — Z8349 Family history of other endocrine, nutritional and metabolic diseases: Secondary | ICD-10-CM | POA: Diagnosis not present

## 2016-01-21 DIAGNOSIS — Z87891 Personal history of nicotine dependence: Secondary | ICD-10-CM | POA: Insufficient documentation

## 2016-01-21 DIAGNOSIS — Z8379 Family history of other diseases of the digestive system: Secondary | ICD-10-CM | POA: Diagnosis not present

## 2016-01-21 DIAGNOSIS — Z7984 Long term (current) use of oral hypoglycemic drugs: Secondary | ICD-10-CM | POA: Diagnosis not present

## 2016-01-21 DIAGNOSIS — K640 First degree hemorrhoids: Secondary | ICD-10-CM | POA: Insufficient documentation

## 2016-01-21 DIAGNOSIS — Z803 Family history of malignant neoplasm of breast: Secondary | ICD-10-CM | POA: Diagnosis not present

## 2016-01-21 DIAGNOSIS — J45909 Unspecified asthma, uncomplicated: Secondary | ICD-10-CM | POA: Diagnosis not present

## 2016-01-21 DIAGNOSIS — I10 Essential (primary) hypertension: Secondary | ICD-10-CM | POA: Diagnosis not present

## 2016-01-21 DIAGNOSIS — M109 Gout, unspecified: Secondary | ICD-10-CM | POA: Insufficient documentation

## 2016-01-21 DIAGNOSIS — Z7982 Long term (current) use of aspirin: Secondary | ICD-10-CM | POA: Diagnosis not present

## 2016-01-21 DIAGNOSIS — Z1211 Encounter for screening for malignant neoplasm of colon: Secondary | ICD-10-CM | POA: Diagnosis present

## 2016-01-21 DIAGNOSIS — Z808 Family history of malignant neoplasm of other organs or systems: Secondary | ICD-10-CM | POA: Insufficient documentation

## 2016-01-21 DIAGNOSIS — Z841 Family history of disorders of kidney and ureter: Secondary | ICD-10-CM | POA: Insufficient documentation

## 2016-01-21 DIAGNOSIS — Z9049 Acquired absence of other specified parts of digestive tract: Secondary | ICD-10-CM | POA: Diagnosis not present

## 2016-01-21 DIAGNOSIS — Z8261 Family history of arthritis: Secondary | ICD-10-CM | POA: Diagnosis not present

## 2016-01-21 HISTORY — DX: Other specified postprocedural states: Z98.890

## 2016-01-21 HISTORY — DX: Nausea with vomiting, unspecified: R11.2

## 2016-01-21 HISTORY — DX: Sleep apnea, unspecified: G47.30

## 2016-01-21 HISTORY — DX: Reserved for inherently not codable concepts without codable children: IMO0001

## 2016-01-21 HISTORY — DX: Unspecified asthma, uncomplicated: J45.909

## 2016-01-21 HISTORY — PX: COLONOSCOPY WITH PROPOFOL: SHX5780

## 2016-01-21 LAB — GLUCOSE, CAPILLARY: Glucose-Capillary: 140 mg/dL — ABNORMAL HIGH (ref 65–99)

## 2016-01-21 SURGERY — COLONOSCOPY WITH PROPOFOL
Anesthesia: General

## 2016-01-21 MED ORDER — LIDOCAINE HCL (PF) 2 % IJ SOLN
INTRAMUSCULAR | Status: DC | PRN
  Administered 2016-01-21: 80 mg via INTRADERMAL

## 2016-01-21 MED ORDER — SODIUM CHLORIDE 0.9 % IV SOLN
INTRAVENOUS | Status: DC
  Administered 2016-01-21: 1000 mL via INTRAVENOUS

## 2016-01-21 MED ORDER — PROPOFOL 500 MG/50ML IV EMUL
INTRAVENOUS | Status: DC | PRN
  Administered 2016-01-21: 150 ug/kg/min via INTRAVENOUS

## 2016-01-21 MED ORDER — PROPOFOL 10 MG/ML IV BOLUS
INTRAVENOUS | Status: DC | PRN
  Administered 2016-01-21: 20 mg via INTRAVENOUS
  Administered 2016-01-21: 60 mg via INTRAVENOUS

## 2016-01-21 NOTE — H&P (Signed)
[facility]  [address] [place], [place] [address] Phone: [phone] Fax : [phone]  Primary Care Physician:  Loistine Chance, MD Primary Gastroenterologist:  Dr. Allen Norris  Pre-Procedure History & Physical: HPI:  Amber Avery is a 70 y.o. female is here for a screening colonoscopy.   Past Medical History  Diagnosis Date  . Diabetes mellitus without complication ([place])   . Hypertension   . Gout   . Hyperlipidemia   . Sleep apnea   . PONV (postoperative nausea and vomiting)   . Asthma   . Shortness of breath dyspnea     Past Surgical History  Procedure Laterality Date  . Cholecystectomy  1977  . Hemorrhoid banding  1980  . Hernia repair  2011  . Appendectomy    . Tonsillectomy and adenoidectomy    . Dilation and curettage of uterus      Due to Amenorrhea  . Hemorroidectomy    . Temporal area excision biopsy      For Birth Mark Changes, Negative Pathology    Prior to Admission medications   Medication Sig Start Date End Date Taking? Authorizing Provider  allopurinol (ZYLOPRIM) 300 MG tablet Take 1 tablet (300 mg total) by mouth daily. 04/04/15   Steele Sizer, MD  aspirin 81 MG tablet Take 1 tablet by mouth daily. 03/26/15   Historical Provider, MD  atorvastatin (LIPITOR) 40 MG tablet TAKE 1 TABLET EVERY EVENING FOR CHOLESTEROL  (REPLACES  SIMVASTATIN) 08/21/15   Steele Sizer, MD  benazepril (LOTENSIN) 20 MG tablet Take 1 tablet (20 mg total) by mouth 2 (two) times daily. 12/09/15   Steele Sizer, MD  Biotin 5000 MCG CAPS Take 1 capsule by mouth daily.    Historical Provider, MD  Blood Glucose Monitoring Suppl (TRUE METRIX AIR GLUCOSE METER) W/DEVICE KIT 1 each by Does not apply route 1 day or 1 dose. 08/09/15   Steele Sizer, MD  calcipotriene (DOVONOX) 0.005 % cream Apply 1 application topically at bedtime.    Historical Provider, MD  clobetasol ointment (TEMOVATE) 1.30 % Apply 1 application topically 2 (two) times daily. 03/20/15   Historical  Provider, MD  colchicine 0.6 MG tablet Take 1 tablet by mouth 2 (two) times daily.    Historical Provider, MD  diclofenac sodium (VOLTAREN) 1 % GEL Apply topically 4 (four) times daily.    Historical Provider, MD  furosemide (LASIX) 40 MG tablet Take 1 tablet (40 mg total) by mouth 2 (two) times daily as needed. 12/09/15   Steele Sizer, MD  gabapentin (NEURONTIN) 300 MG capsule Take 1 capsule (300 mg total) by mouth 3 (three) times daily. 08/06/15   Steele Sizer, MD  glucose blood (TRUE METRIX BLOOD GLUCOSE TEST) test strip Use as instructed 08/09/15   Steele Sizer, MD  metFORMIN (GLUCOPHAGE) 500 MG tablet Take 1-2 tablets (500-1,000 mg total) by mouth 2 (two) times daily. 500 mg in am and 1000 mg in pm 12/20/15   Steele Sizer, MD  potassium chloride SA (K-DUR,KLOR-CON) 20 MEQ tablet Take 1-2 tablets (20-40 mEq total) by mouth daily. 08/06/15   Steele Sizer, MD  tiZANidine (ZANAFLEX) 4 MG tablet Take 1 tablet by mouth 2 (two) times daily. 05/10/15   Historical Provider, MD  triamcinolone cream (KENALOG) 0.1 % Apply 1 application topically as needed.    Historical Provider, MD  triamcinolone ointment (KENALOG) 0.1 % APPLY A PEA SIZE AMOUNT EXTERNALLY TO AREA EVERY OTHER DAY  08/14/15   Steele Sizer, MD  TRUEPLUS LANCETS 30G  MISC 100 each by Does not apply route 1 day or 1 dose. 08/09/15   Steele Sizer, MD  Vitamin D, Cholecalciferol, 1000 UNITS TABS Take 1 capsule by mouth daily. 03/28/15   Historical Provider, MD    Allergies as of 12/11/2015 - Review Complete 12/11/2015  Allergen Reaction Noted  . Codeine  02/22/2015    Family History  Problem Relation Age of Onset  . Cancer Mother     brain tumor  . Kidney disease Mother   . Hypertension Mother   . Heart disease Father   . Hypertension Sister   . Arthritis Sister   . Cancer Sister   . Hernia Sister   . GER disease Sister   . Lupus Sister   . Diabetes Sister   . Breast cancer Maternal Aunt 70    Social History   Social  History  . Marital Status: Divorced    Spouse Name: N/A  . Number of Children: N/A  . Years of Education: N/A   Occupational History  . Not on file.   Social History Main Topics  . Smoking status: Former Smoker -- 15 years  . Smokeless tobacco: Never Used  . Alcohol Use: 0.0 oz/week    0 Standard drinks or equivalent per week     Comment: occasssionally wine  . Drug Use: No  . Sexual Activity: Not Currently   Other Topics Concern  . Not on file   Social History Narrative    Review of Systems: See HPI, otherwise negative ROS  Physical Exam: BP 147/75 mmHg  Pulse 99  Temp(Src) 98.2 F (36.8 C) (Tympanic)  Resp 20  Ht 5' 2"  (1.575 m)  Wt 280 lb (127.007 kg)  BMI 51.20 kg/m2  SpO2 97% General:   Alert,  pleasant and cooperative in NAD Head:  Normocephalic and atraumatic. Neck:  Supple; no masses or thyromegaly. Lungs:  Clear throughout to auscultation.    Heart:  Regular rate and rhythm. Abdomen:  Soft, nontender and nondistended. Normal bowel sounds, without guarding, and without rebound.   Neurologic:  Alert and  oriented x4;  grossly normal neurologically.  Impression/Plan: Amber Avery is now here to undergo a screening colonoscopy.  Risks, benefits, and alternatives regarding colonoscopy have been reviewed with the patient.  Questions have been answered.  All parties agreeable.

## 2016-01-21 NOTE — Anesthesia Postprocedure Evaluation (Signed)
Anesthesia Post Note  Patient: Amber Avery  Procedure(s) Performed: Procedure(s) (LRB): COLONOSCOPY WITH PROPOFOL (N/A)  Patient location during evaluation: Endoscopy Anesthesia Type: General Level of consciousness: awake and alert Pain management: pain level controlled Vital Signs Assessment: post-procedure vital signs reviewed and stable Respiratory status: spontaneous breathing, nonlabored ventilation, respiratory function stable and patient connected to nasal cannula oxygen Cardiovascular status: blood pressure returned to baseline and stable Postop Assessment: no signs of nausea or vomiting Anesthetic complications: no    Last Vitals:  Filed Vitals:   01/21/16 1110 01/21/16 1120  BP: 117/59 121/61  Pulse: 88 88  Temp:    Resp: 16 15    Last Pain: There were no vitals filed for this visit.               Martha Clan

## 2016-01-21 NOTE — Op Note (Signed)
[facility] Patient Name: Amber Avery Procedure Date: 01/21/2016 10:37 AM MRN: [id number] Account #: [id number] Date of Birth: 1946/10/07 Admit Type: Outpatient Age: 70 Room: [facility] ENDO ROOM 4 Gender: Female Note Status: Finalized Procedure:            Colonoscopy Indications:          Screening for colorectal malignant neoplasm Providers:            Lucilla Lame, MD Referring MD:         Bethena Roys. Sowles, MD (Referring MD) Medicines:            Propofol per Anesthesia Complications:        No immediate complications. Procedure:            Pre-Anesthesia Assessment:                       - Prior to the procedure, a History and Physical was                        performed, and patient medications and allergies were                        reviewed. The patient's tolerance of previous                        anesthesia was also reviewed. The risks and benefits of                        the procedure and the sedation options and risks were                        discussed with the patient. All questions were                        answered, and informed consent was obtained. Prior                        Anticoagulants: The patient has taken no previous                        anticoagulant or antiplatelet agents. ASA Grade                        Assessment: II - A patient with mild systemic disease.                        After reviewing the risks and benefits, the patient was                        deemed in satisfactory condition to undergo the                        procedure.                       After obtaining informed consent, the colonoscope was                        passed under direct vision. Throughout the procedure,  the patient's blood pressure, pulse, and oxygen                        saturations were monitored continuously. The                        Colonoscope was introduced through the anus and          advanced to the the cecum, identified by appendiceal                        orifice and ileocecal valve. The colonoscopy was                        performed without difficulty. The patient tolerated the                        procedure well. The quality of the bowel preparation                        was excellent. Findings:      The perianal and digital rectal examinations were normal.      Multiple small-mouthed diverticula were found in the sigmoid colon.      Non-bleeding internal hemorrhoids were found during retroflexion. The       hemorrhoids were Grade I (internal hemorrhoids that do not prolapse). Impression:           - Diverticulosis in the sigmoid colon.                       - Non-bleeding internal hemorrhoids.                       - No specimens collected. Recommendation:       - Repeat colonoscopy in 10 years for screening unless                        any change in family history or lower GI problems. Procedure Code(s):    --- Professional ---                       [phone], Colonoscopy, flexible; diagnostic, including                        collection of specimen(s) by brushing or washing, when                        performed (separate procedure) Diagnosis Code(s):    --- Professional ---                       Z12.11, Encounter for screening for malignant neoplasm                        of colon CPT copyright 2016 American Medical Association. All rights reserved. The codes documented in this report are preliminary and upon coder review may  be revised to meet current compliance requirements. Lucilla Lame, MD 01/21/2016 10:58:34 AM This report has been signed electronically. Number of Addenda: 0 Note Initiated On: 01/21/2016 10:37 AM Scope Withdrawal Time: 0 hours 6 minutes 35 seconds  Total Procedure Duration: 0 hours 12 minutes 33 seconds  [facility]

## 2016-01-21 NOTE — Transfer of Care (Signed)
Immediate Anesthesia Transfer of Care Note  Patient: Amber Avery  Procedure(s) Performed: Procedure(s): COLONOSCOPY WITH PROPOFOL (N/A)  Patient Location: PACU  Anesthesia Type:General  Level of Consciousness: awake  Airway & Oxygen Therapy: Patient Spontanous Breathing  Post-op Assessment: Report given to RN and Post -op Vital signs reviewed and stable  Post vital signs: Reviewed and stable  Last Vitals:  Filed Vitals:   01/21/16 1103 01/21/16 1104  BP:  90/44  Pulse: 91 89  Temp: 36.4 C   Resp: 16 19    Complications: No apparent anesthesia complications

## 2016-01-21 NOTE — Anesthesia Preprocedure Evaluation (Signed)
Anesthesia Evaluation  Patient identified by MRN, date of birth, ID band Patient awake    Reviewed: Allergy & Precautions, H&P , NPO status , Patient's Chart, lab work & pertinent test results, reviewed documented beta blocker date and time   History of Anesthesia Complications (+) PONV and history of anesthetic complications  Airway Mallampati: III  TM Distance: >3 FB Neck ROM: full    Dental no notable dental hx. (+) Partial Lower, Missing   Pulmonary shortness of breath and with exertion, asthma , sleep apnea, Continuous Positive Airway Pressure Ventilation and Oxygen sleep apnea , neg COPD, neg recent URI, former smoker,    Pulmonary exam normal breath sounds clear to auscultation       Cardiovascular Exercise Tolerance: Good hypertension, (-) angina+ CAD  (-) Past MI, (-) Cardiac Stents and (-) CABG Normal cardiovascular exam(-) dysrhythmias (-) Valvular Problems/Murmurs Rhythm:regular Rate:Normal     Neuro/Psych neg Seizures  Neuromuscular disease (Carpal tunnel and lumbar radiculitis) negative psych ROS   GI/Hepatic negative GI ROS, Neg liver ROS,   Endo/Other  diabetes, Oral Hypoglycemic AgentsMorbid obesity  Renal/GU CRFRenal disease  negative genitourinary   Musculoskeletal   Abdominal   Peds  Hematology negative hematology ROS (+)   Anesthesia Other Findings Past Medical History:   Diabetes mellitus without complication (HCC)                 Hypertension                                                 Gout                                                         Hyperlipidemia                                               Sleep apnea                                                  PONV (postoperative nausea and vomiting)                     Asthma                                                       Shortness of breath dyspnea                                  Reproductive/Obstetrics negative OB  ROS                             Anesthesia Physical Anesthesia Plan  ASA: III  Anesthesia Plan: General   Post-op Pain Management:    Induction:   Airway Management Planned:   Additional Equipment:   Intra-op Plan:   Post-operative Plan:   Informed Consent: I have reviewed the patients History and Physical, chart, labs and discussed the procedure including the risks, benefits and alternatives for the proposed anesthesia with the patient or authorized representative who has indicated his/her understanding and acceptance.   Dental Advisory Given  Plan Discussed with: Anesthesiologist, CRNA and Surgeon  Anesthesia Plan Comments:         Anesthesia Quick Evaluation

## 2016-01-22 ENCOUNTER — Telehealth: Payer: Self-pay

## 2016-01-22 ENCOUNTER — Encounter: Payer: Self-pay | Admitting: Gastroenterology

## 2016-01-22 NOTE — Telephone Encounter (Signed)
Patient called and states her BP has been fluctuating since she had her colonoscopy performed yesterday and wanted to make sure this was normal. States her BP this am was 68/42 with some dizziness but started to drink a 20 fl oz bottle of water and than her BP came up to 136/96. Does she need to be concerned or is this normal from the sedatives during the colonoscopy? I informed the patient to stay hydrated and to call the surgeon who performed the colonoscopy. Patient states she has already left a message for them and they instructed patient to call her PCP as well.

## 2016-01-22 NOTE — Telephone Encounter (Signed)
Patient notified

## 2016-01-22 NOTE — Telephone Encounter (Signed)
Stay hydrated, try coconut water, Propel, and needs to stay hydrated

## 2016-03-06 ENCOUNTER — Telehealth: Payer: Self-pay | Admitting: Family Medicine

## 2016-03-06 NOTE — Telephone Encounter (Signed)
Amber Avery from [facility] is requesting [facility] referral. Patient has appointment for Monday 03-09-16 @ [place] (f) [phone] (p) [phone]

## 2016-03-06 NOTE — Telephone Encounter (Signed)
Referral has been placed in [place] on 03/06/16 at 3:54 p.m.

## 2016-04-07 ENCOUNTER — Encounter: Payer: Self-pay | Admitting: Family Medicine

## 2016-04-07 ENCOUNTER — Ambulatory Visit (INDEPENDENT_AMBULATORY_CARE_PROVIDER_SITE_OTHER): Payer: Commercial Managed Care - HMO | Admitting: Family Medicine

## 2016-04-07 VITALS — BP 126/72 | HR 89 | Temp 98.5°F | Resp 16 | Ht 62.0 in | Wt 284.1 lb

## 2016-04-07 DIAGNOSIS — M5417 Radiculopathy, lumbosacral region: Secondary | ICD-10-CM

## 2016-04-07 DIAGNOSIS — N183 Chronic kidney disease, stage 3 unspecified: Secondary | ICD-10-CM

## 2016-04-07 DIAGNOSIS — I10 Essential (primary) hypertension: Secondary | ICD-10-CM | POA: Diagnosis not present

## 2016-04-07 DIAGNOSIS — G4733 Obstructive sleep apnea (adult) (pediatric): Secondary | ICD-10-CM

## 2016-04-07 DIAGNOSIS — E1121 Type 2 diabetes mellitus with diabetic nephropathy: Secondary | ICD-10-CM | POA: Diagnosis not present

## 2016-04-07 DIAGNOSIS — IMO0002 Reserved for concepts with insufficient information to code with codable children: Secondary | ICD-10-CM

## 2016-04-07 DIAGNOSIS — R600 Localized edema: Secondary | ICD-10-CM | POA: Diagnosis not present

## 2016-04-07 DIAGNOSIS — E785 Hyperlipidemia, unspecified: Secondary | ICD-10-CM | POA: Diagnosis not present

## 2016-04-07 DIAGNOSIS — M170 Bilateral primary osteoarthritis of knee: Secondary | ICD-10-CM | POA: Insufficient documentation

## 2016-04-07 DIAGNOSIS — E1165 Type 2 diabetes mellitus with hyperglycemia: Secondary | ICD-10-CM | POA: Diagnosis not present

## 2016-04-07 DIAGNOSIS — M5416 Radiculopathy, lumbar region: Secondary | ICD-10-CM

## 2016-04-07 LAB — POCT GLYCOSYLATED HEMOGLOBIN (HGB A1C): Hemoglobin A1C: 7.2

## 2016-04-07 LAB — POCT UA - MICROALBUMIN: Microalbumin Ur, POC: 50 mg/L

## 2016-04-07 MED ORDER — BENAZEPRIL HCL 20 MG PO TABS: 20.0000 mg | ORAL_TABLET | Freq: Two times a day (BID) | ORAL | Status: DC

## 2016-04-07 MED ORDER — FUROSEMIDE 40 MG PO TABS: 40.0000 mg | ORAL_TABLET | Freq: Two times a day (BID) | ORAL | Status: DC | PRN

## 2016-04-07 NOTE — Progress Notes (Signed)
Name: Amber Avery   MRN: [id number]    DOB: 1946-03-22   Date:04/07/2016       Progress Note  Subjective  Chief Complaint  Chief Complaint  Patient presents with  . Medication Refill  . Diabetes    patient brought her glucometer to show her readings. she had her diabetic eye exam on 03/09/16 with Dr. Gloriann Loan.  . Hypertension  . Chronic Kidney Disease  . Apnea    patient stated that she uses it and it has helped but they had to change out the oxygen.  . Obesity    patient stated that she has been under a lot of stress with taking care of her sister Stanton Kidney)  . Referral    patient wants a referral to Dr. Sharlet Salina for numbness in her hands and left hip pain.    HPI  DM with renal manifestation: HgbA1C has gone up 7.2%, she states she has been more stressed because of sister has cancer ( she is a stress eater). She is on metformin. She denies polyphagia, polydipsia or polyuria. Eye exam is up to date - we are waiting for report.On Ace for CKI used to have proteinuria. Glucose is at goal at home has gone up to 232 low of 96.   Dyslipidemia: taking Atorvastatin, aspirin, history of CAD, no chest pain, some SOB with activity but stable.  No palpitation. Reviewed labs and LDL is at goal   HTN: bp is at goal now continue medication,  denies side effects. No chest pain, no palpitation, occasionally has mild lower extremity swelling  Obesity: She states she has been eating  more since because of stress. Sister diagnosed with Multiple Myeloma and she is a stress eater, she states she has a garden and will start eating more vegetables  OSA: she wear machine every night, she has a new mask and uses oxygen with CPAP.She states she is on temporary machine because the other one broke down, very loud but she is still using as prescribed  Right thyroid nodule; [facility] by Endo at [facility], biopsy done and negative, last TSH was towards upper limits of normal   Varicose Veins: present for years and now has  pain on left groin and upper thigh, bulging varicose. Seen by Vascular Surgeon but procedure will not be covered and she can't afford it   Lumbosacral pain: doing well on Gabapentin, seen by [facility] and had injections. She also has left lower back pain that is getting progressively worse and now radiating to left lateral thigh and down her left knee. Described a burning / like is on fire.    Patient Active Problem List   Diagnosis Date Noted  . Primary osteoarthritis of both knees 04/07/2016  . Special screening for malignant neoplasms, colon   . Right thyroid nodule 06/26/2015  . Asthma, intermittent 04/03/2015  . Benign essential HTN 04/03/2015  . Carpal tunnel syndrome 04/03/2015  . Chronic kidney disease (CKD), stage II (mild) 04/03/2015  . Controlled gout 04/03/2015  . Arteriosclerosis of coronary artery 04/03/2015  . Diabetes mellitus with renal manifestations, controlled ([place]) 04/03/2015  . Dyslipidemia 04/03/2015  . Edema extremities 04/03/2015  . Elevated sedimentation rate 04/03/2015  . Knee pain 04/03/2015  . Lumbar radiculitis 04/03/2015  . Obstructive apnea 04/03/2015  . Lumbosacral spondylosis 04/03/2015  . Osteoarthritis, chronic 04/03/2015  . Psoriasis 04/03/2015  . Vitamin D deficiency 04/03/2015  . Varicose veins 04/03/2015  . Goiter 12/23/2014  . Degeneration of intervertebral disc of lumbar region  08/23/2014  . Corns and callosity 06/11/2009  . Extreme obesity ([place]) 04/11/2007    Past Surgical History  Procedure Laterality Date  . Cholecystectomy  1977  . Hemorrhoid banding  1980  . Hernia repair  2011  . Appendectomy    . Tonsillectomy and adenoidectomy    . Dilation and curettage of uterus      Due to Amenorrhea  . Hemorroidectomy    . Temporal area excision biopsy      For Birth Mark Changes, Negative Pathology  . Colonoscopy with propofol N/A 01/21/2016    Procedure: COLONOSCOPY WITH PROPOFOL;  Surgeon: Lucilla Lame, MD;  Location: [facility] ENDOSCOPY;   Service: Endoscopy;  Laterality: N/A;    Family History  Problem Relation Age of Onset  . Cancer Mother     brain tumor  . Kidney disease Mother   . Hypertension Mother   . Heart disease Father   . Hypertension Sister   . Arthritis Sister   . Cancer Sister   . Hernia Sister   . GER disease Sister   . Lupus Sister   . Diabetes Sister   . Breast cancer Maternal Aunt 70    Social History   Social History  . Marital Status: Divorced    Spouse Name: N/A  . Number of Children: N/A  . Years of Education: N/A   Occupational History  . Not on file.   Social History Main Topics  . Smoking status: Former Smoker -- 15 years  . Smokeless tobacco: Never Used  . Alcohol Use: 0.0 oz/week    0 Standard drinks or equivalent per week     Comment: occasssionally wine  . Drug Use: No  . Sexual Activity: Not Currently   Other Topics Concern  . Not on file   Social History Narrative     Current outpatient prescriptions:  .  allopurinol (ZYLOPRIM) 300 MG tablet, Take 1 tablet (300 mg total) by mouth daily., Disp: 90 tablet, Rfl: 4 .  aspirin 81 MG tablet, Take 1 tablet by mouth daily., Disp: , Rfl:  .  atorvastatin (LIPITOR) 40 MG tablet, TAKE 1 TABLET EVERY EVENING FOR CHOLESTEROL  (REPLACES  SIMVASTATIN), Disp: 90 tablet, Rfl: 3 .  benazepril (LOTENSIN) 20 MG tablet, Take 1 tablet (20 mg total) by mouth 2 (two) times daily., Disp: 180 tablet, Rfl: 1 .  Biotin 5000 MCG CAPS, Take 1 capsule by mouth daily., Disp: , Rfl:  .  clobetasol ointment (TEMOVATE) 6.50 %, Apply 1 application topically 2 (two) times daily., Disp: , Rfl:  .  colchicine 0.6 MG tablet, Take 1 tablet by mouth 2 (two) times daily., Disp: , Rfl:  .  diclofenac sodium (VOLTAREN) 1 % GEL, Apply topically 4 (four) times daily., Disp: , Rfl:  .  furosemide (LASIX) 40 MG tablet, Take 1 tablet (40 mg total) by mouth 2 (two) times daily as needed., Disp: 180 tablet, Rfl: 0 .  gabapentin (NEURONTIN) 300 MG capsule, Take 1  capsule (300 mg total) by mouth 3 (three) times daily., Disp: 270 capsule, Rfl: 3 .  metFORMIN (GLUCOPHAGE) 500 MG tablet, Take 1-2 tablets (500-1,000 mg total) by mouth 2 (two) times daily. 500 mg in am and 1000 mg in pm, Disp: 270 tablet, Rfl: 1 .  potassium chloride SA (K-DUR,KLOR-CON) 20 MEQ tablet, Take 1-2 tablets (20-40 mEq total) by mouth daily., Disp: 180 tablet, Rfl: 3 .  tiZANidine (ZANAFLEX) 4 MG tablet, Take 1 tablet by mouth 2 (two) times daily., Disp: ,  Rfl:  .  triamcinolone ointment (KENALOG) 0.1 %, APPLY A PEA SIZE AMOUNT EXTERNALLY TO AREA EVERY OTHER DAY , Disp: 90 g, Rfl: 1 .  Vitamin D, Cholecalciferol, 1000 UNITS TABS, Take 1 capsule by mouth daily., Disp: , Rfl:  .  Blood Glucose Monitoring Suppl (TRUE METRIX AIR GLUCOSE METER) W/DEVICE KIT, 1 each by Does not apply route 1 day or 1 dose., Disp: 1 kit, Rfl: 0 .  calcipotriene (DOVONOX) 0.005 % cream, Apply 1 application topically at bedtime., Disp: , Rfl:  .  glucose blood (TRUE METRIX BLOOD GLUCOSE TEST) test strip, Use as instructed, Disp: 100 each, Rfl: 12 .  TRUEPLUS LANCETS 30G MISC, 100 each by Does not apply route 1 day or 1 dose., Disp: 100 each, Rfl: 12  Allergies  Allergen Reactions  . Codeine      ROS  Constitutional: Negative for fever, positive for weight change.  Respiratory: Negative for cough , chronic  shortness of breath with activity and stable.   Cardiovascular: Negative for chest pain or palpitations.  Gastrointestinal: Negative for abdominal pain, no bowel changes.  Musculoskeletal: Negative for gait problem or joint swelling.  Skin: Negative for rash.  Neurological: Negative for dizziness or headache.  No other specific complaints in a complete review of systems (except as listed in HPI above).  Objective  Filed Vitals:   04/07/16 0911  BP: 126/72  Pulse: 89  Temp: 98.5 F (36.9 C)  TempSrc: Oral  Resp: 16  Height: _0  (1.575 m)  Weight: 284 lb 1.6 oz (128.867 kg)  SpO2: 94%     Body mass index is 51.95 kg/(m^2).  Physical Exam  Constitutional: Patient appears well-developed and well-nourished. Obese  No distress.  HEENT: head atraumatic, normocephalic, pupils equal and reactive to light, neck supple, throat within normal limits Cardiovascular: Normal rate, regular rhythm and normal heart sounds.  No murmur heard. Trace BLE edema. Pulmonary/Chest: Effort normal and breath sounds normal. No respiratory distress. Abdominal: Soft.  There is no tenderness. Psychiatric: Patient has a normal mood and affect. behavior is normal. Judgment and thought content normal.    Recent Results (from the past 2160 hour(s))  Glucose, capillary     Status: Abnormal   Collection Time: 01/21/16  9:25 AM  Result Value Ref Range   Glucose-Capillary 140 (H) 65 - 99 mg/dL  POCT glycosylated hemoglobin (Hb A1C)     Status: Abnormal   Collection Time: 04/07/16  9:24 AM  Result Value Ref Range   Hemoglobin A1C 7.2   POCT UA - Microalbumin     Status: Abnormal   Collection Time: 04/07/16  9:25 AM  Result Value Ref Range   Microalbumin Ur, POC 50 mg/L   Creatinine, POC  mg/dL   Albumin/Creatinine Ratio, Urine, POC      Diabetic Foot Exam: Diabetic Foot Exam - Simple   Simple Foot Form  Diabetic Foot exam was performed with the following findings:  Yes 04/07/2016 11:58 AM  Visual Inspection  No deformities, no ulcerations, no other skin breakdown bilaterally:  Yes  Sensation Testing  Intact to touch and monofilament testing bilaterally:  Yes  Pulse Check  Posterior Tibialis and Dorsalis pulse intact bilaterally:  Yes  Comments       PHQ2/9: Depression screen [facility] 2/9 04/07/2016 12/09/2015 06/19/2015 04/04/2015  Decreased Interest 0 0 0 0  Down, Depressed, Hopeless 0 0 0 1  PHQ - 2 Score 0 0 0 1  Altered sleeping - - - 1  Tired,  decreased energy - - - 0  Change in appetite - - - 1  Feeling bad or failure about yourself  - - - 0  Trouble concentrating - - - 0  Moving  slowly or fidgety/restless - - - 0  Suicidal thoughts - - - 0  PHQ-9 Score - - - 3  Difficult doing work/chores - - - Not difficult at all    Fall Risk: Fall Risk  04/07/2016 12/09/2015 06/19/2015 04/04/2015  Falls in the past year? No No No No     Functional Status Survey: Is the patient deaf or have difficulty hearing?: No Does the patient have difficulty seeing, even when wearing glasses/contacts?: No Does the patient have difficulty concentrating, remembering, or making decisions?: No Does the patient have difficulty walking or climbing stairs?: Yes (due to chronic pain) Does the patient have difficulty dressing or bathing?: No Does the patient have difficulty doing errands alone such as visiting a doctor's office or shopping?: No    Assessment & Plan  1. Uncontrolled type 2 diabetes mellitus with diabetic nephropathy, without long-term current use of insulin (HCC)  - POCT glycosylated hemoglobin (Hb A1C) - POCT UA - Microalbumin - benazepril (LOTENSIN) 20 MG tablet; Take 1 tablet (20 mg total) by mouth 2 (two) times daily.  Dispense: 180 tablet; Refill: 1  2. Benign essential HTN  - benazepril (LOTENSIN) 20 MG tablet; Take 1 tablet (20 mg total) by mouth 2 (two) times daily.  Dispense: 180 tablet; Refill: 1  3. Dyslipidemia  Continue medication   4. Chronic kidney disease, stage III (moderate)  - benazepril (LOTENSIN) 20 MG tablet; Take 1 tablet (20 mg total) by mouth 2 (two) times daily.  Dispense: 180 tablet; Refill: 1  5. Obstructive apnea  Continue CPAP every day  6. Morbid obesity due to excess calories [facility])  Discussed with the patient the risk posed by an increased BMI. Discussed importance of portion control, calorie counting and at least 150 minutes of physical activity weekly. Avoid sweet beverages and drink more water. Eat at least 6 servings of fruit and vegetables daily    7. Bilateral edema of lower extremity  - furosemide (LASIX) 40 MG tablet; Take  1 tablet (40 mg total) by mouth 2 (two) times daily as needed.  Dispense: 180 tablet; Refill: 0  8. Lumbar back pain with radiculopathy affecting left lower extremity  - Ambulatory referral to Pain Clinic  9. Primary osteoarthritis of both knees  Insurance is no longer covering Voltaren we will try another pharmacy

## 2016-04-09 ENCOUNTER — Telehealth: Payer: Self-pay | Admitting: Family Medicine

## 2016-04-09 NOTE — Telephone Encounter (Signed)
States that she did not get the furosemide prescription it was to expensive

## 2016-04-09 NOTE — Telephone Encounter (Signed)
Would it be cheaper if it was 20 mg? Not sure why so much, she can try calling [facility] to find out the cost

## 2016-04-09 NOTE — Telephone Encounter (Signed)
Left a message for the patient to call her pharmacy and find out the cost of the 20 mg Lasix instead of the 40 mg.

## 2016-05-26 ENCOUNTER — Other Ambulatory Visit: Payer: Self-pay | Admitting: Family Medicine

## 2016-05-26 DIAGNOSIS — M109 Gout, unspecified: Secondary | ICD-10-CM

## 2016-05-26 NOTE — Telephone Encounter (Signed)
Patient requesting refill of Allopurinol to [facility].

## 2016-06-29 ENCOUNTER — Ambulatory Visit (INDEPENDENT_AMBULATORY_CARE_PROVIDER_SITE_OTHER): Payer: Commercial Managed Care - HMO | Admitting: Family Medicine

## 2016-06-29 ENCOUNTER — Encounter: Payer: Self-pay | Admitting: Family Medicine

## 2016-06-29 VITALS — BP 118/68 | HR 94 | Temp 97.7°F | Resp 18 | Ht 62.0 in | Wt 278.1 lb

## 2016-06-29 DIAGNOSIS — E1121 Type 2 diabetes mellitus with diabetic nephropathy: Secondary | ICD-10-CM | POA: Diagnosis not present

## 2016-06-29 DIAGNOSIS — E1165 Type 2 diabetes mellitus with hyperglycemia: Secondary | ICD-10-CM

## 2016-06-29 DIAGNOSIS — IMO0002 Reserved for concepts with insufficient information to code with codable children: Secondary | ICD-10-CM

## 2016-06-29 DIAGNOSIS — E785 Hyperlipidemia, unspecified: Secondary | ICD-10-CM

## 2016-06-29 DIAGNOSIS — Z0000 Encounter for general adult medical examination without abnormal findings: Secondary | ICD-10-CM | POA: Diagnosis not present

## 2016-06-29 DIAGNOSIS — Z23 Encounter for immunization: Secondary | ICD-10-CM

## 2016-06-29 DIAGNOSIS — Z79899 Other long term (current) drug therapy: Secondary | ICD-10-CM

## 2016-06-29 DIAGNOSIS — G4733 Obstructive sleep apnea (adult) (pediatric): Secondary | ICD-10-CM | POA: Diagnosis not present

## 2016-06-29 DIAGNOSIS — N904 Leukoplakia of vulva: Secondary | ICD-10-CM | POA: Insufficient documentation

## 2016-06-29 NOTE — Progress Notes (Signed)
Name: Amber Avery   MRN: [id number]    DOB: 1945/11/15   Date:06/29/2016       Progress Note  Subjective  Chief Complaint  Chief Complaint  Patient presents with  . Annual Exam    HPI  Functional ability/safety issues: she uses a cane to assist with ambulation Hearing issues: Addressed  Activities of daily living: Discussed Home safety issues: No Issues  End Of Life Planning: Offered verbal information regarding advanced directives, healthcare power of attorney.  Preventative care, Health maintenance, Preventative health measures discussed.  Preventative screenings discussed today: lab work, colonoscopy,  mammogram, DEXA.  Low Dose CT Chest recommended if Age 69-80 years, 30 pack-year currently smoking OR have quit w/in 15years.   Lifestyle risk factor issued reviewed: Diet, exercise, weight management, advised patient smoking is not healthy, nutrition/diet.  Preventative health measures discussed (5-10 year plan).  Reviewed and recommended vaccinations: - Pneumovax  - Prevnar  - Annual Influenza - Zostavax - Tdap   Depression screening: Done Fall risk screening: Done Discuss ADLs/IADLs: Done  Current medical providers: See HPI  Other health risk factors identified this visit: No other issues Cognitive impairment issues: None identified  All above discussed with patient. Appropriate education, counseling and referral will be made based upon the above.   Dyslipidemia: taking statin therapy, no side effects, no chest pain.   DM with renal manifestation: HgbA1C has gone up 7.2%, she states she has been more stressed because of sister has cancer ( she is a stress eater). She is on metformin. She denies polyphagia, polydipsia or polyuria. Eye exam is up to date - we are waiting for report.On Ace for CKI used to have proteinuria. Glucose has improved , fasting 108-139, post-prandially 152-191  Obesity: She has been more active, she has a dog now and has been  walking more often. She was stress eating because of her sister being diagnosed with Melanoma, but that has been controlled since she got a new dog.   OSA: on CPAP with oxygen, she is compliant with machine but states oxygen is too expensive  Patient Active Problem List   Diagnosis Date Noted  . Primary osteoarthritis of both knees 04/07/2016  . Special screening for malignant neoplasms, colon   . Right thyroid nodule 06/26/2015  . Asthma, intermittent 04/03/2015  . Benign essential HTN 04/03/2015  . Carpal tunnel syndrome 04/03/2015  . Chronic kidney disease (CKD), stage II (mild) 04/03/2015  . Controlled gout 04/03/2015  . Arteriosclerosis of coronary artery 04/03/2015  . Diabetes mellitus with renal manifestations, controlled ([place]) 04/03/2015  . Dyslipidemia 04/03/2015  . Edema extremities 04/03/2015  . Elevated sedimentation rate 04/03/2015  . Knee pain 04/03/2015  . Lumbar radiculitis 04/03/2015  . Obstructive apnea 04/03/2015  . Lumbosacral spondylosis 04/03/2015  . Osteoarthritis, chronic 04/03/2015  . Psoriasis 04/03/2015  . Vitamin D deficiency 04/03/2015  . Varicose veins 04/03/2015  . Goiter 12/23/2014  . Degeneration of intervertebral disc of lumbar region 08/23/2014  . Corns and callosity 06/11/2009  . Extreme obesity ([place]) 04/11/2007    Past Surgical History:  Procedure Laterality Date  . APPENDECTOMY    . CHOLECYSTECTOMY  1977  . COLONOSCOPY WITH PROPOFOL N/A 01/21/2016   Procedure: COLONOSCOPY WITH PROPOFOL;  Surgeon: Lucilla Lame, MD;  Location: [facility] ENDOSCOPY;  Service: Endoscopy;  Laterality: N/A;  . DILATION AND CURETTAGE OF UTERUS     Due to Amenorrhea  . HEMORRHOID BANDING  1980  . HEMORROIDECTOMY    . HERNIA REPAIR  2011  .  Temporal Area Excision Biopsy     For Birth Mark Changes, Negative Pathology  . TONSILLECTOMY AND ADENOIDECTOMY      Family History  Problem Relation Age of Onset  . Cancer Mother     brain tumor  . Kidney disease Mother    . Hypertension Mother   . Heart disease Father   . Hypertension Sister   . Arthritis Sister   . Cancer Sister   . Hernia Sister   . GER disease Sister   . Lupus Sister   . Diabetes Sister   . Breast cancer Maternal Aunt 70    Social History   Social History  . Marital status: Divorced    Spouse name: N/A  . Number of children: N/A  . Years of education: N/A   Occupational History  . Not on file.   Social History Main Topics  . Smoking status: Former Smoker    Years: 15.00  . Smokeless tobacco: Never Used  . Alcohol use 0.0 oz/week     Comment: rare wine  . Drug use: No  . Sexual activity: Not Currently   Other Topics Concern  . Not on file   Social History Narrative  . No narrative on file     Current Outpatient Prescriptions:  .  allopurinol (ZYLOPRIM) 300 MG tablet, TAKE 1 TABLET EVERY DAY, Disp: 90 tablet, Rfl: 4 .  aspirin 81 MG tablet, Take 1 tablet by mouth daily., Disp: , Rfl:  .  atorvastatin (LIPITOR) 40 MG tablet, TAKE 1 TABLET EVERY EVENING FOR CHOLESTEROL  (REPLACES  SIMVASTATIN), Disp: 90 tablet, Rfl: 3 .  benazepril (LOTENSIN) 20 MG tablet, Take 1 tablet (20 mg total) by mouth 2 (two) times daily., Disp: 180 tablet, Rfl: 1 .  Biotin 5000 MCG CAPS, Take 1 capsule by mouth daily., Disp: , Rfl:  .  Blood Glucose Monitoring Suppl (TRUE METRIX AIR GLUCOSE METER) W/DEVICE KIT, 1 each by Does not apply route 1 day or 1 dose., Disp: 1 kit, Rfl: 0 .  calcipotriene (DOVONOX) 0.005 % cream, Apply 1 application topically at bedtime., Disp: , Rfl:  .  clobetasol ointment (TEMOVATE) 7.03 %, Apply 1 application topically 2 (two) times daily., Disp: , Rfl:  .  colchicine 0.6 MG tablet, Take 1 tablet by mouth 2 (two) times daily., Disp: , Rfl:  .  diclofenac sodium (VOLTAREN) 1 % GEL, Apply topically 4 (four) times daily., Disp: , Rfl:  .  furosemide (LASIX) 40 MG tablet, Take 1 tablet (40 mg total) by mouth 2 (two) times daily as needed., Disp: 180 tablet, Rfl: 0 .   gabapentin (NEURONTIN) 300 MG capsule, Take 1 capsule (300 mg total) by mouth 3 (three) times daily., Disp: 270 capsule, Rfl: 3 .  glucose blood (TRUE METRIX BLOOD GLUCOSE TEST) test strip, Use as instructed, Disp: 100 each, Rfl: 12 .  metFORMIN (GLUCOPHAGE) 500 MG tablet, Take 1-2 tablets (500-1,000 mg total) by mouth 2 (two) times daily. 500 mg in am and 1000 mg in pm, Disp: 270 tablet, Rfl: 1 .  potassium chloride SA (K-DUR,KLOR-CON) 20 MEQ tablet, Take 1-2 tablets (20-40 mEq total) by mouth daily., Disp: 180 tablet, Rfl: 3 .  tiZANidine (ZANAFLEX) 4 MG tablet, Take 1 tablet by mouth 2 (two) times daily., Disp: , Rfl:  .  triamcinolone ointment (KENALOG) 0.1 %, APPLY A PEA SIZE AMOUNT EXTERNALLY TO AREA EVERY OTHER DAY , Disp: 90 g, Rfl: 1 .  TRUEPLUS LANCETS 30G MISC, 100 each by Does not  apply route 1 day or 1 dose., Disp: 100 each, Rfl: 12 .  Vitamin D, Cholecalciferol, 1000 UNITS TABS, Take 1 capsule by mouth daily., Disp: , Rfl:   Allergies  Allergen Reactions  . Codeine      ROS  Constitutional: Negative for fever, positive for weight change.  Respiratory: Negative for cough and shortness of breath.   Cardiovascular: Negative for chest pain or palpitations.  Gastrointestinal: Negative for abdominal pain, no bowel changes.  Musculoskeletal: Negative for gait problem or joint swelling.  Skin: Negative for rash.  Neurological: Negative for dizziness or headache.  No other specific complaints in a complete review of systems (except as listed in HPI above).  Objective  Vitals:   06/29/16 1409  BP: 118/68  Pulse: 94  Resp: 18  Temp: 97.7 F (36.5 C)  TempSrc: Oral  SpO2: 94%  Weight: 278 lb 1.6 oz (126.1 kg)  Height: _0  (1.575 m)    Body mass index is 50.87 kg/m.  Physical Exam  Constitutional: Patient appears well-developed and well-nourished. No distress.  HENT: Head: Normocephalic and atraumatic. Ears: B TMs ok, no erythema or effusion; Nose: Nose normal.  Mouth/Throat: Oropharynx is clear and moist. No oropharyngeal exudate.  Eyes: Conjunctivae and EOM are normal. Pupils are equal, round, and reactive to light. No scleral icterus.  Neck: Normal range of motion. Neck supple. No JVD present. No thyromegaly present.  Cardiovascular: Normal rate, regular rhythm and normal heart sounds.  No murmur heard. No BLE edema. Pulmonary/Chest: Effort normal and breath sounds normal. No respiratory distress. Abdominal: Soft. Bowel sounds are normal, no distension. There is no tenderness. no masses Breast: no lumps or masses, no nipple discharge or rashes FEMALE GENITALIA:  External genitalia is atrophic, and some hypochromia because of lichen sclerosis External urethra normal Pelvic exam not done RECTAL: not done Musculoskeletal: Normal range of motion, no joint effusions, crepitus with extension of left knee, no effusion, no pain during palpation of lumbar spine, good flexion Neurological: he is alert and oriented to person, place, and time. No cranial nerve deficit. Coordination, balance, strength, speech, antalgic gait - chronic knee problems Skin: Skin is warm and dry. No rash noted. No erythema.  Psychiatric: Patient has a normal mood and affect. behavior is normal. Judgment and thought content normal.  Recent Results (from the past 2160 hour(s))  POCT glycosylated hemoglobin (Hb A1C)     Status: Abnormal   Collection Time: 04/07/16  9:24 AM  Result Value Ref Range   Hemoglobin A1C 7.2   POCT UA - Microalbumin     Status: Abnormal   Collection Time: 04/07/16  9:25 AM  Result Value Ref Range   Microalbumin Ur, POC 50 mg/L   Creatinine, POC  mg/dL   Albumin/Creatinine Ratio, Urine, POC       PHQ2/9: Depression screen [facility] 2/9 06/29/2016 04/07/2016 12/09/2015 06/19/2015 04/04/2015  Decreased Interest 0 0 0 0 0  Down, Depressed, Hopeless 0 0 0 0 1  PHQ - 2 Score 0 0 0 0 1  Altered sleeping - - - - 1  Tired, decreased energy - - - - 0  Change in  appetite - - - - 1  Feeling bad or failure about yourself  - - - - 0  Trouble concentrating - - - - 0  Moving slowly or fidgety/restless - - - - 0  Suicidal thoughts - - - - 0  PHQ-9 Score - - - - 3  Difficult doing work/chores - - - -  Not difficult at all     Fall Risk: Fall Risk  06/29/2016 04/07/2016 12/09/2015 06/19/2015 04/04/2015  Falls in the past year? _0       Functional Status Survey: Is the patient deaf or have difficulty hearing?: No Does the patient have difficulty seeing, even when wearing glasses/contacts?: No Does the patient have difficulty concentrating, remembering, or making decisions?: No Does the patient have difficulty walking or climbing stairs?: Yes (Walks with a cane) Does the patient have difficulty dressing or bathing?: No Does the patient have difficulty doing errands alone such as visiting a doctor's office or shopping?: No    Assessment & Plan  1. Medicare annual wellness visit, subsequent  Discussed importance of 150 minutes of physical activity weekly, eat two servings of fish weekly, eat one serving of tree nuts ( cashews, pistachios, pecans, almonds.[name] Kitchen) every other day, eat 6 servings of fruit/vegetables daily and drink plenty of water and avoid sweet beverages.   2. Needs flu shot  - Flu vaccine HIGH DOSE PF (Fluzone High dose)  3. Uncontrolled type 2 diabetes mellitus with diabetic nephropathy, without long-term current use of insulin (HCC)  - Hemoglobin A1c  4. Morbid obesity due to excess calories [facility])  Discussed with the patient the risk posed by an increased BMI. Discussed importance of portion control, calorie counting and at least 150 minutes of physical activity weekly. Avoid sweet beverages and drink more water. Eat at least 6 servings of fruit and vegetables daily   5. Dyslipidemia  - Lipid panel  6. Long-term use of high-risk medication  - COMPLETE METABOLIC PANEL WITH GFR   7. Lichen sclerosus of female  genitalia  Stable  8. Obstructive apnea  She is compliant with her CPAP machine and oxygen The has a replaced machine and it rattles - we will try to give her a new prescription

## 2016-06-29 NOTE — Patient Instructions (Signed)
  Ms. Barnell , Thank you for taking time to come for your Medicare Wellness Visit. I appreciate your ongoing commitment to your health goals. Please review the following plan we discussed and let me know if I can assist you in the future.   These are the goals we discussed:  She will continue to walk her dog   This is a list of the screening recommended for you and due dates:  Health Maintenance  Topic Date Due  . Hemoglobin A1C  10/07/2016  . Eye exam for diabetics  03/09/2017  . Complete foot exam   04/07/2017  . Mammogram  08/04/2017  . Tetanus Vaccine  08/31/2019  . Colon Cancer Screening  01/20/2026  . Flu Shot  Completed  . DEXA scan (bone density measurement)  Completed  . Shingles Vaccine  Completed  .  Hepatitis C: One time screening is recommended by Center for Disease Control  (CDC) for  adults born from [birth year] through 1964.   Completed  . Pneumonia vaccines  Completed

## 2016-07-31 ENCOUNTER — Other Ambulatory Visit: Payer: Self-pay | Admitting: Family Medicine

## 2016-08-01 LAB — LIPID PANEL W/O CHOL/HDL RATIO
Cholesterol, Total: 123 mg/dL (ref 100–199)
HDL: 45 mg/dL
LDL Calculated: 51 mg/dL (ref 0–99)
Triglycerides: 136 mg/dL (ref 0–149)
VLDL Cholesterol Cal: 27 mg/dL (ref 5–40)

## 2016-08-01 LAB — COMPREHENSIVE METABOLIC PANEL WITH GFR
ALT: 23 [IU]/L (ref 0–32)
AST: 22 [IU]/L (ref 0–40)
Albumin/Globulin Ratio: 1.9 (ref 1.2–2.2)
Albumin: 4.1 g/dL (ref 3.6–4.8)
Alkaline Phosphatase: 118 [IU]/L — ABNORMAL HIGH (ref 39–117)
BUN/Creatinine Ratio: 33 — ABNORMAL HIGH (ref 12–28)
BUN: 39 mg/dL — ABNORMAL HIGH (ref 8–27)
Bilirubin Total: 0.4 mg/dL (ref 0.0–1.2)
CO2: 23 mmol/L (ref 18–29)
Calcium: 9.6 mg/dL (ref 8.7–10.3)
Chloride: 103 mmol/L (ref 96–106)
Creatinine, Ser: 1.18 mg/dL — ABNORMAL HIGH (ref 0.57–1.00)
GFR calc Af Amer: 54 mL/min/{1.73_m2} — ABNORMAL LOW
GFR calc non Af Amer: 47 mL/min/{1.73_m2} — ABNORMAL LOW
Globulin, Total: 2.2 g/dL (ref 1.5–4.5)
Glucose: 134 mg/dL — ABNORMAL HIGH (ref 65–99)
Potassium: 5.2 mmol/L (ref 3.5–5.2)
Sodium: 142 mmol/L (ref 134–144)
Total Protein: 6.3 g/dL (ref 6.0–8.5)

## 2016-08-01 LAB — HGB A1C W/O EAG: Hgb A1c MFr Bld: 6.9 % — ABNORMAL HIGH (ref 4.8–5.6)

## 2016-08-05 ENCOUNTER — Encounter: Payer: Self-pay | Admitting: Family Medicine

## 2016-08-07 ENCOUNTER — Ambulatory Visit (INDEPENDENT_AMBULATORY_CARE_PROVIDER_SITE_OTHER): Payer: Commercial Managed Care - HMO | Admitting: Family Medicine

## 2016-08-07 ENCOUNTER — Encounter: Payer: Self-pay | Admitting: Family Medicine

## 2016-08-07 VITALS — BP 130/84 | HR 82 | Temp 98.0°F | Resp 16 | Ht 62.0 in | Wt 272.6 lb

## 2016-08-07 DIAGNOSIS — M170 Bilateral primary osteoarthritis of knee: Secondary | ICD-10-CM

## 2016-08-07 DIAGNOSIS — R809 Proteinuria, unspecified: Secondary | ICD-10-CM | POA: Diagnosis not present

## 2016-08-07 DIAGNOSIS — G4733 Obstructive sleep apnea (adult) (pediatric): Secondary | ICD-10-CM

## 2016-08-07 DIAGNOSIS — M5417 Radiculopathy, lumbosacral region: Secondary | ICD-10-CM

## 2016-08-07 DIAGNOSIS — E1129 Type 2 diabetes mellitus with other diabetic kidney complication: Secondary | ICD-10-CM

## 2016-08-07 DIAGNOSIS — N183 Chronic kidney disease, stage 3 unspecified: Secondary | ICD-10-CM

## 2016-08-07 DIAGNOSIS — I10 Essential (primary) hypertension: Secondary | ICD-10-CM | POA: Diagnosis not present

## 2016-08-07 DIAGNOSIS — M5416 Radiculopathy, lumbar region: Secondary | ICD-10-CM

## 2016-08-07 DIAGNOSIS — R600 Localized edema: Secondary | ICD-10-CM | POA: Diagnosis not present

## 2016-08-07 DIAGNOSIS — E785 Hyperlipidemia, unspecified: Secondary | ICD-10-CM | POA: Diagnosis not present

## 2016-08-07 LAB — POCT GLYCOSYLATED HEMOGLOBIN (HGB A1C): Hemoglobin A1C: 6.9

## 2016-08-07 MED ORDER — FUROSEMIDE 40 MG PO TABS: 40.0000 mg | ORAL_TABLET | Freq: Two times a day (BID) | ORAL | 0 refills | Status: DC | PRN

## 2016-08-07 MED ORDER — BENAZEPRIL HCL 20 MG PO TABS: 20.0000 mg | ORAL_TABLET | Freq: Two times a day (BID) | ORAL | 0 refills | Status: DC

## 2016-08-07 NOTE — Progress Notes (Signed)
Name: Amber Avery   MRN: [id number]    DOB: 15-Sep-1946   Date:08/07/2016       Progress Note  Subjective  Chief Complaint  Chief Complaint  Patient presents with  . Diabetes  . Hyperlipidemia  . Obesity    HPI  Dyslipidemia: taking statin therapy, no side effects, no chest pain.   DM with renal manifestation: HgbA1C is down to 6.9%, she has been more active - she is moving and has been cleaning her mother's house where she will have to move in for one year until her new place is available . She is on metformin. She denies polyphagia, polydipsia or polyuria. Eye exam is up to dateOn Ace for CKI used to have proteinuria. Glucose has improved , fasting 120's, post-prandially 150's  Obesity: her sister Stanton Kidney is doing better and she has been more active, therefore she has lost weight since last visit   OSA: she wear machine every night, she has a new mask and uses oxygen with CPAP.She states she has a new machine.   Right thyroid nodule; seen by Endo at [facility], biopsy done and negative, last TSH was towards upper limits of normal   Varicose Veins: present for years and now has pain on left groin and upper thigh, bulging varicose. Seen by Vascular Surgeon but procedure will not be covered and she can't afford it   Gait instability: she uses a cane, she is moving to a house that has a graveled driveway that is very steep and she is afraid she will fall if she has to walk to mailbox, she would like a letter to get the mail box moved to near the house.   Patient Active Problem List   Diagnosis Date Noted  . Lichen sclerosus of female genitalia 06/29/2016  . Primary osteoarthritis of both knees 04/07/2016  . Special screening for malignant neoplasms, colon   . Right thyroid nodule 06/26/2015  . Asthma, intermittent 04/03/2015  . Benign essential HTN 04/03/2015  . Carpal tunnel syndrome 04/03/2015  . Chronic kidney disease (CKD), stage II (mild) 04/03/2015  . Controlled gout  04/03/2015  . Arteriosclerosis of coronary artery 04/03/2015  . Diabetes mellitus with renal manifestations, controlled ([place]) 04/03/2015  . Dyslipidemia 04/03/2015  . Edema extremities 04/03/2015  . Elevated sedimentation rate 04/03/2015  . Knee pain 04/03/2015  . Lumbar radiculitis 04/03/2015  . Obstructive apnea 04/03/2015  . Lumbosacral spondylosis 04/03/2015  . Osteoarthritis, chronic 04/03/2015  . Psoriasis 04/03/2015  . Vitamin D deficiency 04/03/2015  . Varicose veins 04/03/2015  . Goiter 12/23/2014  . Degeneration of intervertebral disc of lumbar region 08/23/2014  . Corns and callosity 06/11/2009  . Extreme obesity ([place]) 04/11/2007    Past Surgical History:  Procedure Laterality Date  . APPENDECTOMY    . CHOLECYSTECTOMY  1977  . COLONOSCOPY WITH PROPOFOL N/A 01/21/2016   Procedure: COLONOSCOPY WITH PROPOFOL;  Surgeon: Lucilla Lame, MD;  Location: [facility] ENDOSCOPY;  Service: Endoscopy;  Laterality: N/A;  . DILATION AND CURETTAGE OF UTERUS     Due to Amenorrhea  . HEMORRHOID BANDING  1980  . HEMORROIDECTOMY    . HERNIA REPAIR  2011  . Temporal Area Excision Biopsy     For Birth Mark Changes, Negative Pathology  . TONSILLECTOMY AND ADENOIDECTOMY      Family History  Problem Relation Age of Onset  . Cancer Mother     brain tumor  . Kidney disease Mother   . Hypertension Mother   . Heart disease  Father   . Hypertension Sister   . Arthritis Sister   . Cancer Sister   . Hernia Sister   . GER disease Sister   . Lupus Sister   . Diabetes Sister   . Breast cancer Maternal Aunt 70    Social History   Social History  . Marital status: Divorced    Spouse name: N/A  . Number of children: N/A  . Years of education: N/A   Occupational History  . Not on file.   Social History Main Topics  . Smoking status: Former Smoker    Years: 15.00  . Smokeless tobacco: Never Used  . Alcohol use 0.0 oz/week     Comment: rare wine  . Drug use: No  . Sexual activity: Not  Currently   Other Topics Concern  . Not on file   Social History Narrative  . No narrative on file     Current Outpatient Prescriptions:  .  allopurinol (ZYLOPRIM) 300 MG tablet, TAKE 1 TABLET EVERY DAY, Disp: 90 tablet, Rfl: 4 .  aspirin 81 MG tablet, Take 1 tablet by mouth daily., Disp: , Rfl:  .  atorvastatin (LIPITOR) 40 MG tablet, TAKE 1 TABLET EVERY EVENING FOR CHOLESTEROL  (REPLACES  SIMVASTATIN), Disp: 90 tablet, Rfl: 3 .  benazepril (LOTENSIN) 20 MG tablet, Take 1 tablet (20 mg total) by mouth 2 (two) times daily., Disp: 180 tablet, Rfl: 1 .  Biotin 5000 MCG CAPS, Take 1 capsule by mouth daily., Disp: , Rfl:  .  Blood Glucose Monitoring Suppl (TRUE METRIX AIR GLUCOSE METER) W/DEVICE KIT, 1 each by Does not apply route 1 day or 1 dose., Disp: 1 kit, Rfl: 0 .  calcipotriene (DOVONOX) 0.005 % cream, Apply 1 application topically at bedtime., Disp: , Rfl:  .  clobetasol ointment (TEMOVATE) 1.09 %, Apply 1 application topically 2 (two) times daily., Disp: , Rfl:  .  colchicine 0.6 MG tablet, Take 1 tablet by mouth 2 (two) times daily., Disp: , Rfl:  .  diclofenac sodium (VOLTAREN) 1 % GEL, Apply topically 4 (four) times daily., Disp: , Rfl:  .  furosemide (LASIX) 40 MG tablet, Take 1 tablet (40 mg total) by mouth 2 (two) times daily as needed., Disp: 180 tablet, Rfl: 0 .  gabapentin (NEURONTIN) 300 MG capsule, Take 1 capsule (300 mg total) by mouth 3 (three) times daily., Disp: 270 capsule, Rfl: 3 .  glucose blood (TRUE METRIX BLOOD GLUCOSE TEST) test strip, Use as instructed, Disp: 100 each, Rfl: 12 .  metFORMIN (GLUCOPHAGE) 500 MG tablet, Take 1-2 tablets (500-1,000 mg total) by mouth 2 (two) times daily. 500 mg in am and 1000 mg in pm, Disp: 270 tablet, Rfl: 1 .  potassium chloride SA (K-DUR,KLOR-CON) 20 MEQ tablet, Take 1-2 tablets (20-40 mEq total) by mouth daily., Disp: 180 tablet, Rfl: 3 .  tiZANidine (ZANAFLEX) 4 MG tablet, Take 1 tablet by mouth 2 (two) times daily., Disp: ,  Rfl:  .  triamcinolone ointment (KENALOG) 0.1 %, APPLY A PEA SIZE AMOUNT EXTERNALLY TO AREA EVERY OTHER DAY , Disp: 90 g, Rfl: 1 .  TRUEPLUS LANCETS 30G MISC, 100 each by Does not apply route 1 day or 1 dose., Disp: 100 each, Rfl: 12 .  Vitamin D, Cholecalciferol, 1000 UNITS TABS, Take 1 capsule by mouth daily., Disp: , Rfl:   Allergies  Allergen Reactions  . Codeine      ROS  Constitutional: Negative for fever , positive for weight change.  Respiratory: Negative  for cough and shortness of breath.   Cardiovascular: Negative for chest pain or palpitations.  Gastrointestinal: Negative for abdominal pain, no bowel changes.  Musculoskeletal: Positive  for gait problem but no  joint swelling.  Skin: Negative for rash.  Neurological: Negative for dizziness or headache.  No other specific complaints in a complete review of systems (except as listed in HPI above).  Objective  Vitals:   08/07/16 1030  BP: 130/84  Pulse: 82  Resp: 16  Temp: 98 F (36.7 C)  SpO2: 97%  Weight: 272 lb 9 oz (123.6 kg)  Height: 5' 2"  (1.575 m)    Body mass index is 49.85 kg/m.  Physical Exam  Constitutional: Patient appears well-developed and well-nourished. Obese  No distress.  HEENT: head atraumatic, normocephalic, pupils equal and reactive to light, neck supple, throat within normal limits Cardiovascular: Normal rate, regular rhythm and normal heart sounds.  No murmur heard. Trace BLE edema. Pulmonary/Chest: Effort normal and breath sounds normal. No respiratory distress. Abdominal: Soft.  There is no tenderness. Psychiatric: Patient has a normal mood and affect. behavior is normal. Judgment and thought content normal. Muscular Skeletal: she has crepitus with extension of both knees, also has left lower back pain, seen by Dr. Phyllis Ginger and had steroid injections and is doing better   Recent Results (from the past 2160 hour(s))  Comprehensive metabolic panel     Status: Abnormal   Collection  Time: 07/31/16  9:10 AM  Result Value Ref Range   Glucose 134 (H) 65 - 99 mg/dL   BUN 39 (H) 8 - 27 mg/dL   Creatinine, Ser 1.18 (H) 0.57 - 1.00 mg/dL   GFR calc non Af Amer 47 (L) >59 mL/min/1.73   GFR calc Af Amer 54 (L) >59 mL/min/1.73   BUN/Creatinine Ratio 33 (H) 12 - 28   Sodium 142 134 - 144 mmol/L   Potassium 5.2 3.5 - 5.2 mmol/L   Chloride 103 96 - 106 mmol/L   CO2 23 18 - 29 mmol/L   Calcium 9.6 8.7 - 10.3 mg/dL   Total Protein 6.3 6.0 - 8.5 g/dL   Albumin 4.1 3.6 - 4.8 g/dL   Globulin, Total 2.2 1.5 - 4.5 g/dL   Albumin/Globulin Ratio 1.9 1.2 - 2.2   Bilirubin Total 0.4 0.0 - 1.2 mg/dL   Alkaline Phosphatase 118 (H) 39 - 117 IU/L   AST 22 0 - 40 IU/L   ALT 23 0 - 32 IU/L  Lipid Panel w/o Chol/HDL Ratio     Status: None   Collection Time: 07/31/16  9:10 AM  Result Value Ref Range   Cholesterol, Total 123 100 - 199 mg/dL   Triglycerides 136 0 - 149 mg/dL   HDL 45 >39 mg/dL   VLDL Cholesterol Cal 27 5 - 40 mg/dL   LDL Calculated 51 0 - 99 mg/dL  Hgb A1c w/o eAG     Status: Abnormal   Collection Time: 07/31/16  9:10 AM  Result Value Ref Range   Hgb A1c MFr Bld 6.9 (H) 4.8 - 5.6 %    Comment:          Pre-diabetes: 5.7 - 6.4          Diabetes: >6.4          Glycemic control for adults with diabetes: <7.0   POCT HgB A1C     Status: None   Collection Time: 08/07/16 10:34 AM  Result Value Ref Range   Hemoglobin A1C 6.9  PHQ2/9: Depression screen [facility] 2/9 08/07/2016 06/29/2016 04/07/2016 12/09/2015 06/19/2015  Decreased Interest 0 0 0 0 0  Down, Depressed, Hopeless 0 0 0 0 0  PHQ - 2 Score 0 0 0 0 0  Altered sleeping - - - - -  Tired, decreased energy - - - - -  Change in appetite - - - - -  Feeling bad or failure about yourself  - - - - -  Trouble concentrating - - - - -  Moving slowly or fidgety/restless - - - - -  Suicidal thoughts - - - - -  PHQ-9 Score - - - - -  Difficult doing work/chores - - - - -    Fall Risk: Fall Risk  08/07/2016 06/29/2016  04/07/2016 12/09/2015 06/19/2015  Falls in the past year? No No No No No     Functional Status Survey: Is the patient deaf or have difficulty hearing?: No Does the patient have difficulty seeing, even when wearing glasses/contacts?: No Does the patient have difficulty concentrating, remembering, or making decisions?: No Does the patient have difficulty walking or climbing stairs?: Yes Does the patient have difficulty dressing or bathing?: No Does the patient have difficulty doing errands alone such as visiting a doctor's office or shopping?: No    Assessment & Plan  1. Controlled type 2 diabetes mellitus with microalbuminuria, without long-term current use of insulin (HCC)  - POCT HgB A1C  2. Morbid obesity due to excess calories ([place])  Losing weight, more active lately  3. Dyslipidemia  Under control, discussed importance of increasing good cholesterol   4. Benign essential HTN  At goal   5. Obstructive apnea  Continue CPAP every month  6. Bilateral edema of lower extremity  Stable   7. Primary osteoarthritis of both knees  Continue topical medication   8. Lumbar back pain with radiculopathy affecting left lower extremity  Doing better, paresthesia has improved with steroid injection

## 2016-08-30 ENCOUNTER — Other Ambulatory Visit: Payer: Self-pay | Admitting: Family Medicine

## 2016-08-30 DIAGNOSIS — N183 Chronic kidney disease, stage 3 unspecified: Secondary | ICD-10-CM

## 2016-08-30 DIAGNOSIS — I10 Essential (primary) hypertension: Secondary | ICD-10-CM

## 2016-08-30 DIAGNOSIS — M47817 Spondylosis without myelopathy or radiculopathy, lumbosacral region: Secondary | ICD-10-CM

## 2016-08-30 DIAGNOSIS — R600 Localized edema: Secondary | ICD-10-CM

## 2016-09-04 ENCOUNTER — Telehealth: Payer: Self-pay | Admitting: Family Medicine

## 2016-09-04 NOTE — Telephone Encounter (Signed)
Referral has been put in and called Amber Avery to inform her it was suspended but once the fax came through for the referral I would fax it to them.

## 2016-09-04 NOTE — Telephone Encounter (Signed)
Amber Avery from [facility] is requesting a [facility] referral. Patient has appointment on Monday 09/07/16 @ 10:45 (F) [phone]

## 2016-12-11 ENCOUNTER — Ambulatory Visit (INDEPENDENT_AMBULATORY_CARE_PROVIDER_SITE_OTHER): Payer: Medicare HMO | Admitting: Family Medicine

## 2016-12-11 ENCOUNTER — Encounter: Payer: Self-pay | Admitting: Family Medicine

## 2016-12-11 VITALS — BP 118/62 | HR 85 | Temp 97.6°F | Resp 16 | Ht 62.0 in | Wt 270.5 lb

## 2016-12-11 DIAGNOSIS — M170 Bilateral primary osteoarthritis of knee: Secondary | ICD-10-CM

## 2016-12-11 DIAGNOSIS — N183 Chronic kidney disease, stage 3 unspecified: Secondary | ICD-10-CM

## 2016-12-11 DIAGNOSIS — I10 Essential (primary) hypertension: Secondary | ICD-10-CM | POA: Diagnosis not present

## 2016-12-11 DIAGNOSIS — M5417 Radiculopathy, lumbosacral region: Secondary | ICD-10-CM

## 2016-12-11 DIAGNOSIS — L409 Psoriasis, unspecified: Secondary | ICD-10-CM

## 2016-12-11 DIAGNOSIS — G4733 Obstructive sleep apnea (adult) (pediatric): Secondary | ICD-10-CM | POA: Diagnosis not present

## 2016-12-11 DIAGNOSIS — J4540 Moderate persistent asthma, uncomplicated: Secondary | ICD-10-CM | POA: Diagnosis not present

## 2016-12-11 DIAGNOSIS — E1165 Type 2 diabetes mellitus with hyperglycemia: Secondary | ICD-10-CM

## 2016-12-11 DIAGNOSIS — M109 Gout, unspecified: Secondary | ICD-10-CM

## 2016-12-11 DIAGNOSIS — M5416 Radiculopathy, lumbar region: Secondary | ICD-10-CM

## 2016-12-11 DIAGNOSIS — R809 Proteinuria, unspecified: Secondary | ICD-10-CM | POA: Diagnosis not present

## 2016-12-11 DIAGNOSIS — R600 Localized edema: Secondary | ICD-10-CM

## 2016-12-11 DIAGNOSIS — E1129 Type 2 diabetes mellitus with other diabetic kidney complication: Secondary | ICD-10-CM | POA: Diagnosis not present

## 2016-12-11 DIAGNOSIS — E785 Hyperlipidemia, unspecified: Secondary | ICD-10-CM | POA: Diagnosis not present

## 2016-12-11 DIAGNOSIS — IMO0002 Reserved for concepts with insufficient information to code with codable children: Secondary | ICD-10-CM

## 2016-12-11 LAB — GLUCOSE, POCT (MANUAL RESULT ENTRY): POC Glucose: 169 mg/dL — AB (ref 70–99)

## 2016-12-11 LAB — POCT GLYCOSYLATED HEMOGLOBIN (HGB A1C): Hemoglobin A1C: 7.2

## 2016-12-11 MED ORDER — GABAPENTIN 300 MG PO CAPS: ORAL_CAPSULE | ORAL | 3 refills | Status: DC

## 2016-12-11 MED ORDER — TRUE METRIX AIR GLUCOSE METER W/DEVICE KIT: 1.0000 | PACK | 0 refills | Status: DC

## 2016-12-11 MED ORDER — FUROSEMIDE 40 MG PO TABS: 40.0000 mg | ORAL_TABLET | Freq: Two times a day (BID) | ORAL | 0 refills | Status: DC | PRN

## 2016-12-11 MED ORDER — BENAZEPRIL HCL 20 MG PO TABS: 20.0000 mg | ORAL_TABLET | Freq: Two times a day (BID) | ORAL | 3 refills | Status: DC

## 2016-12-11 MED ORDER — INSULIN PEN NEEDLE 32G X 6 MM MISC: 1.0000 | Freq: Every day | 1 refills | Status: DC

## 2016-12-11 MED ORDER — DICLOFENAC SODIUM 1 % TD GEL: 4.0000 g | Freq: Four times a day (QID) | TRANSDERMAL | 1 refills | Status: DC

## 2016-12-11 MED ORDER — ALLOPURINOL 300 MG PO TABS: 300.0000 mg | ORAL_TABLET | Freq: Every day | ORAL | 4 refills | Status: DC

## 2016-12-11 MED ORDER — METFORMIN HCL 500 MG PO TABS: ORAL_TABLET | ORAL | 1 refills | Status: DC

## 2016-12-11 MED ORDER — BUDESONIDE 0.25 MG/2ML IN SUSP: 0.2500 mg | Freq: Two times a day (BID) | RESPIRATORY_TRACT | 2 refills | Status: DC

## 2016-12-11 MED ORDER — TRIAMCINOLONE ACETONIDE 0.1 % EX OINT: TOPICAL_OINTMENT | CUTANEOUS | 1 refills | Status: DC

## 2016-12-11 MED ORDER — LIRAGLUTIDE 18 MG/3ML ~~LOC~~ SOPN: 0.6000 mg | PEN_INJECTOR | Freq: Every day | SUBCUTANEOUS | 1 refills | Status: DC

## 2016-12-11 MED ORDER — ATORVASTATIN CALCIUM 40 MG PO TABS: ORAL_TABLET | ORAL | 3 refills | Status: DC

## 2016-12-11 MED ORDER — ALBUTEROL SULFATE 1.25 MG/3ML IN NEBU: 1.0000 | INHALATION_SOLUTION | Freq: Two times a day (BID) | RESPIRATORY_TRACT | 2 refills | Status: DC

## 2016-12-11 NOTE — Progress Notes (Signed)
Name: Amber Avery   MRN: [id number]    DOB: December 07, 1945   Date:12/11/2016       Progress Note  Subjective  Chief Complaint  Chief Complaint  Patient presents with  . Diabetes    4 month follow up blood sugars have been running high recently pt also needs new machine  . Gout  . Hyperlipidemia  . Sleep Apnea    needs new mask    HPI  Dyslipidemia: taking statin therapy, no side effects, no chest pain.   Asthma: over the past few weeks she has noticed a morning cough and feels like her chest is congested, currently does not have medication for asthma  Nose bleed: she has noticed intermittent nose bleed and a scab over the past month, it is sore and bleeds a little in am's.   DM with renal manifestation: HgbA1C was down to 6.9%, but up above 7%. She is on metformin, but she would like to have something to help the glucose go down, discussed Victoza and she is willing to try it, no family history of thyroid cancer and her thyroid nodule was evaluated by endo. She denies polyphagia, polydipsia or polyuria. Eye exam is up to dateOn Ace for [facility] used to have proteinuria.  Obesity: she is trying to walk more often and lost 3 lbs since last visit   OSA: she wear machine every night, she has a new mask and uses oxygen with CPAP.She states she has a new machine but would like a new mask  Right thyroid nodule; seen by Endo at [facility], biopsy done and negative  Varicose Veins: present for years and now has pain on left groin and upper thigh, bulging varicose. Seen by Vascular Surgeon but procedure will not be covered and she can't afford it   Gait instability: she uses a cane, she is moving to a house that has a graveled driveway that is very steep and she is afraid she will fall if she has to walk to mailbox, she would like a letter to get the mail box moved to near the house.   OA : she has daily mild pain, she would like to try Voltaren again  Low back pain: she states back pain is  better since epidural injection, radiculitis resolved, on GAbapentin   Patient Active Problem List   Diagnosis Date Noted  . Lichen sclerosus of female genitalia 06/29/2016  . Primary osteoarthritis of both knees 04/07/2016  . Special screening for malignant neoplasms, colon   . Right thyroid nodule 06/26/2015  . Asthma, intermittent 04/03/2015  . Benign essential HTN 04/03/2015  . Carpal tunnel syndrome 04/03/2015  . Chronic kidney disease (CKD), stage II (mild) 04/03/2015  . Controlled gout 04/03/2015  . Arteriosclerosis of coronary artery 04/03/2015  . Diabetes mellitus with renal manifestations, controlled ([place]) 04/03/2015  . Dyslipidemia 04/03/2015  . Edema extremities 04/03/2015  . Elevated sedimentation rate 04/03/2015  . Knee pain 04/03/2015  . Lumbar radiculitis 04/03/2015  . Obstructive apnea 04/03/2015  . Lumbosacral spondylosis 04/03/2015  . Osteoarthritis, chronic 04/03/2015  . Psoriasis 04/03/2015  . Vitamin D deficiency 04/03/2015  . Varicose veins 04/03/2015  . Goiter 12/23/2014  . Degeneration of intervertebral disc of lumbar region 08/23/2014  . Corns and callosity 06/11/2009  . Extreme obesity ([place]) 04/11/2007    Past Surgical History:  Procedure Laterality Date  . APPENDECTOMY    . CHOLECYSTECTOMY  1977  . COLONOSCOPY WITH PROPOFOL N/A 01/21/2016   Procedure: COLONOSCOPY WITH PROPOFOL;  Surgeon: Lucilla Lame, MD;  Location: [facility] ENDOSCOPY;  Service: Endoscopy;  Laterality: N/A;  . DILATION AND CURETTAGE OF UTERUS     Due to Amenorrhea  . HEMORRHOID BANDING  1980  . HEMORROIDECTOMY    . HERNIA REPAIR  2011  . Temporal Area Excision Biopsy     For Birth Mark Changes, Negative Pathology  . TONSILLECTOMY AND ADENOIDECTOMY      Family History  Problem Relation Age of Onset  . Cancer Mother     brain tumor  . Kidney disease Mother   . Hypertension Mother   . Heart disease Father   . Hypertension Sister   . Arthritis Sister   . Cancer Sister   .  Hernia Sister   . GER disease Sister   . Lupus Sister   . Diabetes Sister   . Breast cancer Maternal Aunt 70    Social History   Social History  . Marital status: Divorced    Spouse name: N/A  . Number of children: N/A  . Years of education: N/A   Occupational History  . Not on file.   Social History Main Topics  . Smoking status: Former Smoker    Years: 15.00  . Smokeless tobacco: Never Used  . Alcohol use 0.0 oz/week     Comment: rare wine  . Drug use: No  . Sexual activity: Not Currently   Other Topics Concern  . Not on file   Social History Narrative  . No narrative on file     Current Outpatient Prescriptions:  .  albuterol (ACCUNEB) 1.25 MG/3ML nebulizer solution, Take 3 mLs (1.25 mg total) by nebulization 2 (two) times daily., Disp: 75 mL, Rfl: 2 .  allopurinol (ZYLOPRIM) 300 MG tablet, Take 1 tablet (300 mg total) by mouth daily., Disp: 90 tablet, Rfl: 4 .  aspirin 81 MG tablet, Take 1 tablet by mouth daily., Disp: , Rfl:  .  atorvastatin (LIPITOR) 40 MG tablet, Daily for cholesterol, Disp: 90 tablet, Rfl: 3 .  benazepril (LOTENSIN) 20 MG tablet, Take 1 tablet (20 mg total) by mouth 2 (two) times daily., Disp: 180 tablet, Rfl: 3 .  Biotin 5000 MCG CAPS, Take 1 capsule by mouth daily., Disp: , Rfl:  .  Blood Glucose Monitoring Suppl (TRUE METRIX AIR GLUCOSE METER) w/Device KIT, 1 each by Does not apply route 1 day or 1 dose., Disp: 1 kit, Rfl: 0 .  budesonide (PULMICORT) 0.25 MG/2ML nebulizer solution, Take 2 mLs (0.25 mg total) by nebulization 2 (two) times daily., Disp: 60 mL, Rfl: 2 .  calcipotriene (DOVONOX) 0.005 % cream, Apply 1 application topically at bedtime., Disp: , Rfl:  .  clobetasol ointment (TEMOVATE) 3.87 %, Apply 1 application topically 2 (two) times daily., Disp: , Rfl:  .  colchicine 0.6 MG tablet, Take 1 tablet by mouth 2 (two) times daily., Disp: , Rfl:  .  diclofenac sodium (VOLTAREN) 1 % GEL, Apply 4 g topically 4 (four) times daily., Disp:  100 g, Rfl: 1 .  furosemide (LASIX) 40 MG tablet, Take 1 tablet (40 mg total) by mouth 2 (two) times daily as needed., Disp: 180 tablet, Rfl: 0 .  gabapentin (NEURONTIN) 300 MG capsule, TAKE 1 CAPSULE THREE TIMES DAILY, Disp: 270 capsule, Rfl: 3 .  glucose blood (TRUE METRIX BLOOD GLUCOSE TEST) test strip, Use as instructed, Disp: 100 each, Rfl: 12 .  Insulin Pen Needle (NOVOFINE) 32G X 6 MM MISC, 1 each by Does not apply route daily., Disp: 100  each, Rfl: 1 .  liraglutide (VICTOZA) 18 MG/3ML SOPN, Inject 0.1-0.3 mLs (0.6-1.8 mg total) into the skin daily., Disp: 18 mL, Rfl: 1 .  metFORMIN (GLUCOPHAGE) 500 MG tablet, TAKE 1 TAB IN MORNING AND 2 TABS IN EVENING, Disp: 270 tablet, Rfl: 1 .  potassium chloride SA (K-DUR,KLOR-CON) 20 MEQ tablet, Take 1-2 tablets (20-40 mEq total) by mouth daily., Disp: 180 tablet, Rfl: 3 .  triamcinolone ointment (KENALOG) 0.1 %, APPLY A PEA SIZE AMOUNT EXTERNALLY TO AREA EVERY OTHER DAY, Disp: 90 g, Rfl: 1 .  TRUEPLUS LANCETS 30G MISC, 100 each by Does not apply route 1 day or 1 dose., Disp: 100 each, Rfl: 12 .  Vitamin D, Cholecalciferol, 1000 UNITS TABS, Take 1 capsule by mouth daily., Disp: , Rfl:   Allergies  Allergen Reactions  . Codeine      ROS  Constitutional: Negative for fever or significant weight change.  Respiratory: Positive  for cough and mild shortness of breath with activity .   Cardiovascular: Negative for chest pain or palpitations.  Gastrointestinal: Negative for abdominal pain, no bowel changes.  Musculoskeletal: Negative for gait problem or joint swelling.  Skin: Negative for rash.  Neurological: Negative for dizziness or headache.  No other specific complaints in a complete review of systems (except as listed in HPI above).  Objective  Vitals:   12/11/16 0951  BP: 118/62  Pulse: 85  Resp: 16  Temp: 97.6 F (36.4 C)  TempSrc: Oral  SpO2: 95%  Weight: 270 lb 8 oz (122.7 kg)  Height: _0  (1.575 m)    Body mass index is  49.48 kg/m.  Physical Exam  Constitutional: Patient appears well-developed and well-nourished. Obese  No distress.  HEENT: head atraumatic, normocephalic, pupils equal and reactive to light, neck supple, throat within normal limits. Small scab on right anterior septum, advised to use vaseline at night and if no resolution call back for referral to ENT Cardiovascular: Normal rate, regular rhythm and normal heart sounds.  No murmur heard. 1 plus  BLE edema. Pulmonary/Chest: Effort normal and breath sounds normal. No respiratory distress. Abdominal: Soft.  There is no tenderness. Psychiatric: Patient has a normal mood and affect. behavior is normal. Judgment and thought content normal.  Recent Results (from the past 2160 hour(s))  POCT Glucose (CBG)     Status: Abnormal   Collection Time: 12/11/16  9:56 AM  Result Value Ref Range   POC Glucose 169 (A) 70 - 99 mg/dl  POCT HgB A1C     Status: Abnormal   Collection Time: 12/11/16 10:06 AM  Result Value Ref Range   Hemoglobin A1C 7.2     PHQ2/9: Depression screen [facility] 2/9 08/07/2016 06/29/2016 04/07/2016 12/09/2015 06/19/2015  Decreased Interest 0 0 0 0 0  Down, Depressed, Hopeless 0 0 0 0 0  PHQ - 2 Score 0 0 0 0 0  Altered sleeping - - - - -  Tired, decreased energy - - - - -  Change in appetite - - - - -  Feeling bad or failure about yourself  - - - - -  Trouble concentrating - - - - -  Moving slowly or fidgety/restless - - - - -  Suicidal thoughts - - - - -  PHQ-9 Score - - - - -  Difficult doing work/chores - - - - -     Fall Risk: Fall Risk  08/07/2016 06/29/2016 04/07/2016 12/09/2015 06/19/2015  Falls in the past year? _1   Assessment & Plan  1. Uncontrolled type 2 diabetes mellitus with microalbuminuria, without long-term current use of insulin (HCC)  - POCT HgB A1C - POCT Glucose (CBG) - metFORMIN (GLUCOPHAGE) 500 MG tablet; TAKE 1 TAB IN MORNING AND 2 TABS IN EVENING  Dispense: 270 tablet; Refill: 1 -  Blood Glucose Monitoring Suppl (TRUE METRIX AIR GLUCOSE METER) w/Device KIT; 1 each by Does not apply route 1 day or 1 dose.  Dispense: 1 kit; Refill: 0 - liraglutide (VICTOZA) 18 MG/3ML SOPN; Inject 0.1-0.3 mLs (0.6-1.8 mg total) into the skin daily.  Dispense: 18 mL; Refill: 1 - Insulin Pen Needle (NOVOFINE) 32G X 6 MM MISC; 1 each by Does not apply route daily.  Dispense: 100 each; Refill: 1  2. Morbid obesity due to excess calories ([place])  We will try Victoza  3. Dyslipidemia  - atorvastatin (LIPITOR) 40 MG tablet; Daily for cholesterol  Dispense: 90 tablet; Refill: 3  4. Benign essential HTN  - benazepril (LOTENSIN) 20 MG tablet; Take 1 tablet (20 mg total) by mouth 2 (two) times daily.  Dispense: 180 tablet; Refill: 3  5. Obstructive apnea  Wearing every night  6. Primary osteoarthritis of both knees  We will try to resume Voltaren  7. Asthma, moderate persistent, poorly-controlled  - budesonide (PULMICORT) 0.25 MG/2ML nebulizer solution; Take 2 mLs (0.25 mg total) by nebulization 2 (two) times daily.  Dispense: 60 mL; Refill: 2 - albuterol (ACCUNEB) 1.25 MG/3ML nebulizer solution; Take 3 mLs (1.25 mg total) by nebulization 2 (two) times daily.  Dispense: 75 mL; Refill: 2  8. Controlled gout  - allopurinol (ZYLOPRIM) 300 MG tablet; Take 1 tablet (300 mg total) by mouth daily.  Dispense: 90 tablet; Refill: 4  9. Chronic kidney disease, stage III (moderate)  - benazepril (LOTENSIN) 20 MG tablet; Take 1 tablet (20 mg total) by mouth 2 (two) times daily.  Dispense: 180 tablet; Refill: 3  10. Bilateral edema of lower extremity  - furosemide (LASIX) 40 MG tablet; Take 1 tablet (40 mg total) by mouth 2 (two) times daily as needed.  Dispense: 180 tablet; Refill: 0  11. Lumbar back pain with radiculopathy affecting left lower extremity  - gabapentin (NEURONTIN) 300 MG capsule; TAKE 1 CAPSULE THREE TIMES DAILY  Dispense: 270 capsule; Refill: 3  12. Bilateral primary  osteoarthritis of knee  - diclofenac sodium (VOLTAREN) 1 % GEL; Apply 4 g topically 4 (four) times daily.  Dispense: 100 g; Refill: 1  13. Psoriasis  - triamcinolone ointment (KENALOG) 0.1 %; APPLY A PEA SIZE AMOUNT EXTERNALLY TO AREA EVERY OTHER DAY  Dispense: 90 g; Refill: 1

## 2016-12-15 ENCOUNTER — Other Ambulatory Visit: Payer: Self-pay | Admitting: Family Medicine

## 2016-12-15 DIAGNOSIS — R809 Proteinuria, unspecified: Principal | ICD-10-CM

## 2016-12-15 DIAGNOSIS — E1165 Type 2 diabetes mellitus with hyperglycemia: Principal | ICD-10-CM

## 2016-12-15 DIAGNOSIS — E1129 Type 2 diabetes mellitus with other diabetic kidney complication: Secondary | ICD-10-CM

## 2016-12-15 DIAGNOSIS — IMO0002 Reserved for concepts with insufficient information to code with codable children: Secondary | ICD-10-CM

## 2016-12-15 MED ORDER — LIRAGLUTIDE 18 MG/3ML ~~LOC~~ SOPN: 0.6000 mg | PEN_INJECTOR | Freq: Every day | SUBCUTANEOUS | 1 refills | Status: DC

## 2016-12-15 NOTE — Telephone Encounter (Signed)
Charles from [facility] states patient told him that she could get Victoza cheaper at [facility] then her mail order. Therefore he is asking for a new prescription to be sent over. (P) [phone] (F) [phone]

## 2016-12-16 DIAGNOSIS — R69 Illness, unspecified: Secondary | ICD-10-CM | POA: Diagnosis not present

## 2016-12-23 ENCOUNTER — Telehealth: Payer: Self-pay

## 2016-12-23 NOTE — Telephone Encounter (Signed)
Pt stated that Amber Avery will not cover Victoza and it needs to be sent to [facility] and pt also wants know if albuterol and pulmicort should be mixed and also she does not have a nebulizer machine for the solutions.

## 2016-12-24 NOTE — Telephone Encounter (Signed)
Do they cover Trulicity?  It will be too expensive if not covered by insurance.  The home health company should have dropped off the nebulizer machine, what company did she use?  I can also write a prescription to be picked up at local pharmacy but it will cost more.

## 2016-12-29 ENCOUNTER — Other Ambulatory Visit: Payer: Self-pay | Admitting: Family Medicine

## 2016-12-29 ENCOUNTER — Telehealth: Payer: Self-pay

## 2016-12-29 NOTE — Telephone Encounter (Signed)
Xultophy contains Victoza that can cause indigestion, but usually improves with time.  We can give her another sample to see if she can tolerate. I can hold off on sending to pharmacy for now

## 2016-12-29 NOTE — Progress Notes (Unsigned)
erroneous entry 

## 2016-12-29 NOTE — Telephone Encounter (Signed)
Patient called to state she still has not received her mask nor her nebulizer, I will call the company since they did sent back the prescription just needed a signature and date. Also patient states Xultophy was not called into [facility], her mail order will cost her $650 a month but at [place] it is only $47.00 a month. Patient wanted to inform Dr. Ancil Boozer she has been taking Xultophy from the samples given and it is causing abdominal pain, please advise.

## 2016-12-30 DIAGNOSIS — G4733 Obstructive sleep apnea (adult) (pediatric): Secondary | ICD-10-CM | POA: Diagnosis not present

## 2016-12-30 DIAGNOSIS — E1165 Type 2 diabetes mellitus with hyperglycemia: Secondary | ICD-10-CM | POA: Diagnosis not present

## 2016-12-30 DIAGNOSIS — I10 Essential (primary) hypertension: Secondary | ICD-10-CM | POA: Diagnosis not present

## 2016-12-30 DIAGNOSIS — E785 Hyperlipidemia, unspecified: Secondary | ICD-10-CM | POA: Diagnosis not present

## 2016-12-30 NOTE — Telephone Encounter (Signed)
Patient informed. 

## 2017-01-01 DIAGNOSIS — J449 Chronic obstructive pulmonary disease, unspecified: Secondary | ICD-10-CM | POA: Diagnosis not present

## 2017-01-14 DIAGNOSIS — G4733 Obstructive sleep apnea (adult) (pediatric): Secondary | ICD-10-CM | POA: Diagnosis not present

## 2017-02-14 DIAGNOSIS — G4733 Obstructive sleep apnea (adult) (pediatric): Secondary | ICD-10-CM | POA: Diagnosis not present

## 2017-02-15 ENCOUNTER — Other Ambulatory Visit: Payer: Self-pay | Admitting: Family Medicine

## 2017-03-16 DIAGNOSIS — G4733 Obstructive sleep apnea (adult) (pediatric): Secondary | ICD-10-CM | POA: Diagnosis not present

## 2017-03-18 DIAGNOSIS — R69 Illness, unspecified: Secondary | ICD-10-CM | POA: Diagnosis not present

## 2017-03-20 ENCOUNTER — Other Ambulatory Visit: Payer: Self-pay | Admitting: Family Medicine

## 2017-03-20 DIAGNOSIS — R600 Localized edema: Secondary | ICD-10-CM

## 2017-03-21 ENCOUNTER — Encounter: Payer: Self-pay | Admitting: Family Medicine

## 2017-03-29 DIAGNOSIS — R69 Illness, unspecified: Secondary | ICD-10-CM | POA: Diagnosis not present

## 2017-04-02 DIAGNOSIS — E089 Diabetes mellitus due to underlying condition without complications: Secondary | ICD-10-CM | POA: Diagnosis not present

## 2017-04-02 DIAGNOSIS — H2513 Age-related nuclear cataract, bilateral: Secondary | ICD-10-CM | POA: Diagnosis not present

## 2017-04-02 DIAGNOSIS — H04123 Dry eye syndrome of bilateral lacrimal glands: Secondary | ICD-10-CM | POA: Diagnosis not present

## 2017-04-02 DIAGNOSIS — E119 Type 2 diabetes mellitus without complications: Secondary | ICD-10-CM | POA: Diagnosis not present

## 2017-04-09 ENCOUNTER — Ambulatory Visit (INDEPENDENT_AMBULATORY_CARE_PROVIDER_SITE_OTHER): Payer: Medicare HMO | Admitting: Family Medicine

## 2017-04-09 ENCOUNTER — Encounter: Payer: Self-pay | Admitting: Family Medicine

## 2017-04-09 ENCOUNTER — Other Ambulatory Visit: Payer: Self-pay | Admitting: Family Medicine

## 2017-04-09 VITALS — BP 118/68 | HR 84 | Temp 97.7°F | Resp 16 | Ht 62.0 in | Wt 264.1 lb

## 2017-04-09 DIAGNOSIS — E785 Hyperlipidemia, unspecified: Secondary | ICD-10-CM | POA: Diagnosis not present

## 2017-04-09 DIAGNOSIS — M5417 Radiculopathy, lumbosacral region: Secondary | ICD-10-CM

## 2017-04-09 DIAGNOSIS — N183 Chronic kidney disease, stage 3 unspecified: Secondary | ICD-10-CM

## 2017-04-09 DIAGNOSIS — G4733 Obstructive sleep apnea (adult) (pediatric): Secondary | ICD-10-CM | POA: Diagnosis not present

## 2017-04-09 DIAGNOSIS — N182 Chronic kidney disease, stage 2 (mild): Secondary | ICD-10-CM | POA: Diagnosis not present

## 2017-04-09 DIAGNOSIS — M109 Gout, unspecified: Secondary | ICD-10-CM | POA: Diagnosis not present

## 2017-04-09 DIAGNOSIS — M170 Bilateral primary osteoarthritis of knee: Secondary | ICD-10-CM

## 2017-04-09 DIAGNOSIS — M79661 Pain in right lower leg: Secondary | ICD-10-CM

## 2017-04-09 DIAGNOSIS — Z23 Encounter for immunization: Secondary | ICD-10-CM

## 2017-04-09 DIAGNOSIS — R600 Localized edema: Secondary | ICD-10-CM

## 2017-04-09 DIAGNOSIS — E1122 Type 2 diabetes mellitus with diabetic chronic kidney disease: Secondary | ICD-10-CM

## 2017-04-09 DIAGNOSIS — I10 Essential (primary) hypertension: Secondary | ICD-10-CM | POA: Diagnosis not present

## 2017-04-09 DIAGNOSIS — J4520 Mild intermittent asthma, uncomplicated: Secondary | ICD-10-CM

## 2017-04-09 DIAGNOSIS — M5416 Radiculopathy, lumbar region: Secondary | ICD-10-CM

## 2017-04-09 DIAGNOSIS — R252 Cramp and spasm: Secondary | ICD-10-CM | POA: Diagnosis not present

## 2017-04-09 DIAGNOSIS — D509 Iron deficiency anemia, unspecified: Secondary | ICD-10-CM | POA: Diagnosis not present

## 2017-04-09 LAB — LIPID PANEL
Cholesterol: 128 mg/dL
HDL: 49 mg/dL — ABNORMAL LOW
LDL Cholesterol: 59 mg/dL
Total CHOL/HDL Ratio: 2.6 ratio
Triglycerides: 102 mg/dL
VLDL: 20 mg/dL

## 2017-04-09 LAB — COMPLETE METABOLIC PANEL WITHOUT GFR
ALT: 13 U/L (ref 6–29)
AST: 16 U/L (ref 10–35)
Albumin: 4 g/dL (ref 3.6–5.1)
Alkaline Phosphatase: 93 U/L (ref 33–130)
BUN: 37 mg/dL — ABNORMAL HIGH (ref 7–25)
CO2: 26 mmol/L (ref 20–31)
Calcium: 9.4 mg/dL (ref 8.6–10.4)
Chloride: 105 mmol/L (ref 98–110)
Creat: 1.2 mg/dL — ABNORMAL HIGH (ref 0.50–0.99)
GFR, Est African American: 53 mL/min — ABNORMAL LOW
GFR, Est Non African American: 46 mL/min — ABNORMAL LOW
Glucose, Bld: 92 mg/dL (ref 65–99)
Potassium: 5 mmol/L (ref 3.5–5.3)
Sodium: 141 mmol/L (ref 135–146)
Total Bilirubin: 0.5 mg/dL (ref 0.2–1.2)
Total Protein: 6.5 g/dL (ref 6.1–8.1)

## 2017-04-09 LAB — MAGNESIUM: Magnesium: 1.9 mg/dL (ref 1.5–2.5)

## 2017-04-09 LAB — CBC WITH DIFFERENTIAL/PLATELET
Basophils Absolute: 0 {cells}/uL (ref 0–200)
Basophils Relative: 0 %
Eosinophils Absolute: 335 {cells}/uL (ref 15–500)
Eosinophils Relative: 5 %
HCT: 34.4 % — ABNORMAL LOW (ref 35.0–45.0)
Hemoglobin: 10.8 g/dL — ABNORMAL LOW (ref 11.7–15.5)
Lymphocytes Relative: 21 %
Lymphs Abs: 1407 {cells}/uL (ref 850–3900)
MCH: 28.3 pg (ref 27.0–33.0)
MCHC: 31.4 g/dL — ABNORMAL LOW (ref 32.0–36.0)
MCV: 90.3 fL (ref 80.0–100.0)
MPV: 9.7 fL (ref 7.5–12.5)
Monocytes Absolute: 402 {cells}/uL (ref 200–950)
Monocytes Relative: 6 %
Neutro Abs: 4556 {cells}/uL (ref 1500–7800)
Neutrophils Relative %: 68 %
Platelets: 239 10*3/uL (ref 140–400)
RBC: 3.81 MIL/uL (ref 3.80–5.10)
RDW: 17.1 % — ABNORMAL HIGH (ref 11.0–15.0)
WBC: 6.7 10*3/uL (ref 3.8–10.8)

## 2017-04-09 LAB — URIC ACID: Uric Acid, Serum: 5.5 mg/dL (ref 2.5–7.0)

## 2017-04-09 LAB — POCT GLYCOSYLATED HEMOGLOBIN (HGB A1C): Hemoglobin A1C: 7

## 2017-04-09 NOTE — Progress Notes (Signed)
Name: Amber Avery   MRN: [id number]    DOB: 11-04-1945   Date:04/09/2017       Progress Note  Subjective  Chief Complaint  Chief Complaint  Patient presents with  . Diabetes    4 month follow up checks daily  . Hypertension  . Hyperlipidemia  . Obesity  . Chronic Kidney Disease  . Pain    joint pain in hands    HPI  Dyslipidemia: taking statin therapy, no side effects, no chest pain.   Asthma: off inhalers, doing better, no wheezing, cough at this time, she has decrease in exercise tolerance - stable.   DM with renal manifestation: HgbA1C was down to 6.9%, but up above 7.2, today 7.0%%. She is on metformin, and is now on Victoza since 11/2016, but only on 0.6, advised to increase to 1.2. Tolerating medication well,  She denies polyphagia, polydipsia or polyuria. Eye exam is up to dateOn Ace for CKI used to have proteinuria.   Obesity: she is trying to walk more often and lost 6 lbs  lbs since last visit   OSA: she wear machine every night, she has a new mask and uses oxygen with CPAP.She is doing well on new mask. Wakes up feeling rested  Right thyroid nodule; seen by Endo at [facility], biopsy done and negative  Varicose Veins: present for years and now has pain on left groin and upper thigh, bulging varicose. Seen by Vascular Surgeon but procedure will not be covered and she can't afford it . She has noticed a tender nodule on right distal leg, some swelling and pain with pressure, we will check doppler US  Gait instability: she uses a cane, she is moving to a house that has a graveled driveway that is very steep and she is afraid she will fall if she has to walk to mailbox, she would like a letter to get the mail box moved to near the house.    OA : she is having more pain on right knee and would like to see Ortho , not ready for surgery but would like to consider injections  Low back pain: she states back pain is better since epidural injection, radiculitis  resolved, on GAbapentin    Patient Active Problem List   Diagnosis Date Noted  . Lichen sclerosus of female genitalia 06/29/2016  . Primary osteoarthritis of both knees 04/07/2016  . Special screening for malignant neoplasms, colon   . Right thyroid nodule 06/26/2015  . Asthma, intermittent 04/03/2015  . Benign essential HTN 04/03/2015  . Carpal tunnel syndrome 04/03/2015  . Chronic kidney disease (CKD), stage II (mild) 04/03/2015  . Controlled gout 04/03/2015  . Arteriosclerosis of coronary artery 04/03/2015  . Diabetes mellitus with renal manifestations, controlled ([place]) 04/03/2015  . Dyslipidemia 04/03/2015  . Edema extremities 04/03/2015  . Elevated sedimentation rate 04/03/2015  . Knee pain 04/03/2015  . Lumbar radiculitis 04/03/2015  . Obstructive apnea 04/03/2015  . Lumbosacral spondylosis 04/03/2015  . Osteoarthritis, chronic 04/03/2015  . Psoriasis 04/03/2015  . Vitamin D deficiency 04/03/2015  . Varicose veins 04/03/2015  . Goiter 12/23/2014  . Degeneration of intervertebral disc of lumbar region 08/23/2014  . Corns and callosity 06/11/2009  . Extreme obesity 04/11/2007    Past Surgical History:  Procedure Laterality Date  . APPENDECTOMY    . CHOLECYSTECTOMY  1977  . COLONOSCOPY WITH PROPOFOL N/A 01/21/2016   Procedure: COLONOSCOPY WITH PROPOFOL;  Surgeon: Lucilla Lame, MD;  Location: [facility] ENDOSCOPY;  Service: Endoscopy;  Laterality: N/A;  . DILATION AND CURETTAGE OF UTERUS     Due to Amenorrhea  . HEMORRHOID BANDING  1980  . HEMORROIDECTOMY    . HERNIA REPAIR  2011  . Temporal Area Excision Biopsy     For Birth Mark Changes, Negative Pathology  . TONSILLECTOMY AND ADENOIDECTOMY      Family History  Problem Relation Age of Onset  . Cancer Mother        brain tumor  . Kidney disease Mother   . Hypertension Mother   . Heart disease Father   . Hypertension Sister   . Arthritis Sister   . Cancer Sister   . Hernia Sister   . GER disease Sister   . Lupus  Sister   . Diabetes Sister   . Breast cancer Maternal Aunt 70    Social History   Social History  . Marital status: Divorced    Spouse name: N/A  . Number of children: N/A  . Years of education: N/A   Occupational History  . Not on file.   Social History Main Topics  . Smoking status: Former Smoker    Years: 15.00  . Smokeless tobacco: Never Used  . Alcohol use 0.0 oz/week     Comment: rare wine  . Drug use: No  . Sexual activity: Not Currently   Other Topics Concern  . Not on file   Social History Narrative  . No narrative on file     Current Outpatient Prescriptions:  .  albuterol (ACCUNEB) 1.25 MG/3ML nebulizer solution, Take 3 mLs (1.25 mg total) by nebulization 2 (two) times daily., Disp: 75 mL, Rfl: 2 .  allopurinol (ZYLOPRIM) 300 MG tablet, Take 1 tablet (300 mg total) by mouth daily., Disp: 90 tablet, Rfl: 4 .  aspirin 81 MG tablet, Take 1 tablet by mouth daily., Disp: , Rfl:  .  atorvastatin (LIPITOR) 40 MG tablet, Daily for cholesterol, Disp: 90 tablet, Rfl: 3 .  benazepril (LOTENSIN) 20 MG tablet, Take 1 tablet (20 mg total) by mouth 2 (two) times daily., Disp: 180 tablet, Rfl: 3 .  Biotin 5000 MCG CAPS, Take 1 capsule by mouth daily., Disp: , Rfl:  .  Blood Glucose Monitoring Suppl (TRUE METRIX AIR GLUCOSE METER) w/Device KIT, 1 each by Does not apply route 1 day or 1 dose., Disp: 1 kit, Rfl: 0 .  budesonide (PULMICORT) 0.25 MG/2ML nebulizer solution, Take 2 mLs (0.25 mg total) by nebulization 2 (two) times daily., Disp: 60 mL, Rfl: 2 .  calcipotriene (DOVONOX) 0.005 % cream, Apply 1 application topically at bedtime., Disp: , Rfl:  .  clobetasol ointment (TEMOVATE) 6.44 %, Apply 1 application topically 2 (two) times daily., Disp: , Rfl:  .  colchicine 0.6 MG tablet, Take 1 tablet by mouth 2 (two) times daily., Disp: , Rfl:  .  diclofenac sodium (VOLTAREN) 1 % GEL, Apply 4 g topically 4 (four) times daily., Disp: 100 g, Rfl: 1 .  furosemide (LASIX) 40 MG  tablet, TAKE 1 TABLET 2 TIMES DAILYAS NEEDED, Disp: 180 tablet, Rfl: 0 .  gabapentin (NEURONTIN) 300 MG capsule, TAKE 1 CAPSULE THREE TIMES DAILY, Disp: 270 capsule, Rfl: 3 .  glucose blood (TRUE METRIX BLOOD GLUCOSE TEST) test strip, Use as instructed, Disp: 100 each, Rfl: 12 .  Insulin Pen Needle (NOVOFINE) 32G X 6 MM MISC, 1 each by Does not apply route daily., Disp: 100 each, Rfl: 1 .  liraglutide (VICTOZA) 18 MG/3ML SOPN, Inject 0.1-0.3 mLs (0.6-1.8 mg total)  into the skin daily., Disp: 18 mL, Rfl: 1 .  metFORMIN (GLUCOPHAGE) 500 MG tablet, TAKE 1 TAB IN MORNING AND 2 TABS IN EVENING, Disp: 270 tablet, Rfl: 1 .  potassium chloride SA (K-DUR,KLOR-CON) 20 MEQ tablet, Take 1-2 tablets (20-40 mEq total) by mouth daily., Disp: 180 tablet, Rfl: 3 .  triamcinolone ointment (KENALOG) 0.1 %, APPLY A PEA SIZE AMOUNT EXTERNALLY TO AREA EVERY OTHER DAY, Disp: 90 g, Rfl: 1 .  TRUEPLUS LANCETS 30G MISC, 100 each by Does not apply route 1 day or 1 dose., Disp: 100 each, Rfl: 12 .  Vitamin D, Cholecalciferol, 1000 UNITS TABS, Take 1 capsule by mouth daily., Disp: , Rfl:   Allergies  Allergen Reactions  . Codeine      ROS  Constitutional: Negative for fever, positive for  weight change.  Respiratory: Negative for cough and shortness of breath.   Cardiovascular: Negative for chest pain or palpitations.  Gastrointestinal: Negative for abdominal pain, no bowel changes.  Musculoskeletal: Positive  for gait problem but no joint swelling.  Skin: Negative for rash.  Neurological: Negative for dizziness or headache.  No other specific complaints in a complete review of systems (except as listed in HPI above).  Objective  Vitals:   04/09/17 1004  BP: 118/68  Pulse: 84  Resp: 16  Temp: 97.7 F (36.5 C)  SpO2: 92%  Weight: 264 lb 2 oz (119.8 kg)  Height: 5' 2"  (1.575 m)    Body mass index is 48.31 kg/m.  Physical Exam  Constitutional: Patient appears well-developed and well-nourished. Obese   No distress.  HEENT: head atraumatic, normocephalic, pupils equal and reactive to light,  neck supple, throat within normal limits Cardiovascular: Normal rate, regular rhythm and normal heart sounds.  No murmur heard. Trace BLE edema. She has a tender nodule, possible inside vessel on right medial distal leg  Pulmonary/Chest: Effort normal and breath sounds normal. No respiratory distress. Abdominal: Soft.  There is no tenderness. Psychiatric: Patient has a normal mood and affect. behavior is normal. Judgment and thought content normal. Muscular Skeletal: no synovitis, using cane, crepitus extension of both knee  Recent Results (from the past 2160 hour(s))  POCT HgB A1C     Status: Abnormal   Collection Time: 04/09/17 10:21 AM  Result Value Ref Range   Hemoglobin A1C 7.0     Diabetic Foot Exam: Diabetic Foot Exam - Simple   Simple Foot Form Diabetic Foot exam was performed with the following findings:  Yes 04/09/2017 10:43 AM  Visual Inspection No deformities, no ulcerations, no other skin breakdown bilaterally:  Yes Sensation Testing Intact to touch and monofilament testing bilaterally:  Yes Pulse Check Posterior Tibialis and Dorsalis pulse intact bilaterally:  Yes Comments      PHQ2/9: Depression screen [facility] 2/9 08/07/2016 06/29/2016 04/07/2016 12/09/2015 06/19/2015  Decreased Interest 0 0 0 0 0  Down, Depressed, Hopeless 0 0 0 0 0  PHQ - 2 Score 0 0 0 0 0  Altered sleeping - - - - -  Tired, decreased energy - - - - -  Change in appetite - - - - -  Feeling bad or failure about yourself  - - - - -  Trouble concentrating - - - - -  Moving slowly or fidgety/restless - - - - -  Suicidal thoughts - - - - -  PHQ-9 Score - - - - -  Difficult doing work/chores - - - - -     Fall Risk: Fall Risk  08/07/2016 06/29/2016 04/07/2016 12/09/2015 06/19/2015  Falls in the past year? No No No No No     Assessment & Plan  1. Controlled type 2 diabetes mellitus with stage 2 chronic kidney  disease, unspecified whether long term insulin use ([place])  At goal, continue ACE, victoza and Metformin  - POCT HgB A1C  2. Morbid obesity due to excess calories ([place])  Lost weight since last visit, continue the hard work   3. Dyslipidemia  - Lipid panel  4. Benign essential HTN  Low bp but denies dizziness    5. Controlled gout  - Uric acid  6. Bilateral edema of lower extremity  stable  7. Chronic kidney disease, stage III (moderate)  - COMPLETE METABOLIC PANEL WITH GFR - CBC with Differential/Platelet - VITAMIN D 25 Hydroxy (Vit-D Deficiency, Fractures)  8. Obstructive apnea  Wearing CPAP every night   9. Primary osteoarthritis of both knees  -referral Ortho  10. Asthma, mild intermittent, well-controlled  Stopped inhaler , doing better, does not want to have spirometry   11. Lumbar back pain with radiculopathy affecting left lower extremity  stable  12. Need for vaccination for pneumococcus  - Pneumococcal conjugate vaccine 13-valent IM  13. Leg cramp  - Magnesium

## 2017-04-10 LAB — VITAMIN D 25 HYDROXY (VIT D DEFICIENCY, FRACTURES): Vit D, 25-Hydroxy: 41 ng/mL (ref 30–100)

## 2017-04-12 ENCOUNTER — Other Ambulatory Visit: Payer: Self-pay | Admitting: Family Medicine

## 2017-04-12 DIAGNOSIS — D649 Anemia, unspecified: Secondary | ICD-10-CM | POA: Insufficient documentation

## 2017-04-13 LAB — IRON,TIBC AND FERRITIN PANEL
%SAT: 11 % (ref 11–50)
Ferritin: 18 ng/mL — ABNORMAL LOW (ref 20–288)
Iron: 51 ug/dL (ref 45–160)
TIBC: 448 ug/dL (ref 250–450)

## 2017-04-16 DIAGNOSIS — G4733 Obstructive sleep apnea (adult) (pediatric): Secondary | ICD-10-CM | POA: Diagnosis not present

## 2017-04-26 DIAGNOSIS — R69 Illness, unspecified: Secondary | ICD-10-CM | POA: Diagnosis not present

## 2017-04-27 ENCOUNTER — Other Ambulatory Visit: Payer: Self-pay | Admitting: Family Medicine

## 2017-04-27 ENCOUNTER — Ambulatory Visit: Payer: Medicare HMO

## 2017-04-27 DIAGNOSIS — M79661 Pain in right lower leg: Secondary | ICD-10-CM

## 2017-04-27 DIAGNOSIS — D649 Anemia, unspecified: Secondary | ICD-10-CM

## 2017-04-27 LAB — POC HEMOCCULT BLD/STL (HOME/3-CARD/SCREEN)
Card #2 Fecal Occult Blod, POC: NEGATIVE
Card #3 Fecal Occult Blood, POC: NEGATIVE
Fecal Occult Blood, POC: NEGATIVE

## 2017-05-16 DIAGNOSIS — G4733 Obstructive sleep apnea (adult) (pediatric): Secondary | ICD-10-CM | POA: Diagnosis not present

## 2017-05-20 ENCOUNTER — Encounter: Payer: Self-pay | Admitting: Family Medicine

## 2017-05-20 ENCOUNTER — Other Ambulatory Visit: Payer: Self-pay | Admitting: Family Medicine

## 2017-05-20 DIAGNOSIS — E1129 Type 2 diabetes mellitus with other diabetic kidney complication: Secondary | ICD-10-CM

## 2017-05-20 DIAGNOSIS — R809 Proteinuria, unspecified: Principal | ICD-10-CM

## 2017-05-20 DIAGNOSIS — IMO0002 Reserved for concepts with insufficient information to code with codable children: Secondary | ICD-10-CM

## 2017-05-20 DIAGNOSIS — E1165 Type 2 diabetes mellitus with hyperglycemia: Principal | ICD-10-CM

## 2017-05-20 DIAGNOSIS — I10 Essential (primary) hypertension: Secondary | ICD-10-CM

## 2017-05-20 MED ORDER — TRUE METRIX AIR GLUCOSE METER W/DEVICE KIT: 1.0000 | PACK | 0 refills | Status: DC

## 2017-05-20 MED ORDER — POTASSIUM CHLORIDE CRYS ER 20 MEQ PO TBCR: 20.0000 meq | EXTENDED_RELEASE_TABLET | Freq: Every day | ORAL | 3 refills | Status: DC

## 2017-05-26 ENCOUNTER — Other Ambulatory Visit: Payer: Self-pay

## 2017-05-26 NOTE — Progress Notes (Unsigned)
onetouch ultra

## 2017-06-04 ENCOUNTER — Other Ambulatory Visit: Payer: Self-pay | Admitting: Family Medicine

## 2017-06-04 DIAGNOSIS — IMO0002 Reserved for concepts with insufficient information to code with codable children: Secondary | ICD-10-CM

## 2017-06-04 DIAGNOSIS — E1165 Type 2 diabetes mellitus with hyperglycemia: Principal | ICD-10-CM

## 2017-06-04 DIAGNOSIS — R809 Proteinuria, unspecified: Principal | ICD-10-CM

## 2017-06-04 DIAGNOSIS — E1129 Type 2 diabetes mellitus with other diabetic kidney complication: Secondary | ICD-10-CM

## 2017-06-14 ENCOUNTER — Ambulatory Visit (INDEPENDENT_AMBULATORY_CARE_PROVIDER_SITE_OTHER): Payer: Medicare HMO

## 2017-06-14 ENCOUNTER — Other Ambulatory Visit: Payer: Self-pay | Admitting: Family Medicine

## 2017-06-14 ENCOUNTER — Telehealth: Payer: Self-pay

## 2017-06-14 VITALS — BP 132/64 | HR 76 | Temp 97.3°F | Ht 62.0 in | Wt 261.5 lb

## 2017-06-14 DIAGNOSIS — Z0000 Encounter for general adult medical examination without abnormal findings: Secondary | ICD-10-CM

## 2017-06-14 DIAGNOSIS — M170 Bilateral primary osteoarthritis of knee: Secondary | ICD-10-CM

## 2017-06-14 DIAGNOSIS — Z23 Encounter for immunization: Secondary | ICD-10-CM | POA: Diagnosis not present

## 2017-06-14 NOTE — Telephone Encounter (Signed)
Referral placed.

## 2017-06-14 NOTE — Patient Instructions (Addendum)
Amber Avery , Thank you for taking time to come for your Medicare Wellness Visit. I appreciate your ongoing commitment to your health goals. Please review the following plan we discussed and let me know if I can assist you in the future.   Screening recommendations/referrals: Colonoscopy: up to date Mammogram: currently due, pt to set up by the end of 2018. Bone Density: up to date Recommended yearly ophthalmology/optometry visit for glaucoma screening and checkup Recommended yearly dental visit for hygiene and checkup  Vaccinations: Influenza vaccine: completed today Pneumococcal vaccine: completed series Tdap vaccine: up to date, due 08/2019 Shingles vaccine: completed 02/12/11  Advanced directives: Already on file  Conditions/risks identified: Obesity; Recommend to start exercising 3 times a week for 20-30 minutes at a time.   Next appointment: 07/02/17 @ 11:00 AM   Preventive Care 65 Years and Older, Female Preventive care refers to lifestyle choices and visits with your health care provider that can promote health and wellness. What does preventive care include?  A yearly physical exam. This is also called an annual well check.  Dental exams once or twice a year.  Routine eye exams. Ask your health care provider how often you should have your eyes checked.  Personal lifestyle choices, including:  Daily care of your teeth and gums.  Regular physical activity.  Eating a healthy diet.  Avoiding tobacco and drug use.  Limiting alcohol use.  Practicing safe sex.  Taking low-dose aspirin every day.  Taking vitamin and mineral supplements as recommended by your health care provider. What happens during an annual well check? The services and screenings done by your health care provider during your annual well check will depend on your age, overall health, lifestyle risk factors, and family history of disease. Counseling  Your health care provider may ask you  questions about your:  Alcohol use.  Tobacco use.  Drug use.  Emotional well-being.  Home and relationship well-being.  Sexual activity.  Eating habits.  History of falls.  Memory and ability to understand (cognition).  Work and work Statistician.  Reproductive health. Screening  You may have the following tests or measurements:  Height, weight, and BMI.  Blood pressure.  Lipid and cholesterol levels. These may be checked every 5 years, or more frequently if you are over 63 years old.  Skin check.  Lung cancer screening. You may have this screening every year starting at age 51 if you have a 30-pack-year history of smoking and currently smoke or have quit within the past 15 years.  Fecal occult blood test (FOBT) of the stool. You may have this test every year starting at age 64.  Flexible sigmoidoscopy or colonoscopy. You may have a sigmoidoscopy every 5 years or a colonoscopy every 10 years starting at age 39.  Hepatitis C blood test.  Hepatitis B blood test.  Sexually transmitted disease (STD) testing.  Diabetes screening. This is done by checking your blood sugar (glucose) after you have not eaten for a while (fasting). You may have this done every 1-3 years.  Bone density scan. This is done to screen for osteoporosis. You may have this done starting at age 50.  Mammogram. This may be done every 1-2 years. Talk to your health care provider about how often you should have regular mammograms. Talk with your health care provider about your test results, treatment options, and if necessary, the need for more tests. Vaccines  Your health care provider may recommend certain vaccines, such as:  Influenza vaccine. This  is recommended every year.  Tetanus, diphtheria, and acellular pertussis (Tdap, Td) vaccine. You may need a Td booster every 10 years.  Zoster vaccine. You may need this after age 43.  Pneumococcal 13-valent conjugate (PCV13) vaccine. One dose is  recommended after age 69.  Pneumococcal polysaccharide (PPSV23) vaccine. One dose is recommended after age 58. Talk to your health care provider about which screenings and vaccines you need and how often you need them. This information is not intended to replace advice given to you by your health care provider. Make sure you discuss any questions you have with your health care provider. Document Released: 11/01/2015 Document Revised: 06/24/2016 Document Reviewed: 08/06/2015 Elsevier Interactive Patient Education  2017 [place] Prevention in the Home Falls can cause injuries. They can happen to people of all ages. There are many things you can do to make your home safe and to help prevent falls. What can I do on the outside of my home?  Regularly fix the edges of walkways and driveways and fix any cracks.  Remove anything that might make you trip as you walk through a door, such as a raised step or threshold.  Trim any bushes or trees on the path to your home.  Use bright outdoor lighting.  Clear any walking paths of anything that might make someone trip, such as rocks or tools.  Regularly check to see if handrails are loose or broken. Make sure that both sides of any steps have handrails.  Any raised decks and porches should have guardrails on the edges.  Have any leaves, snow, or ice cleared regularly.  Use sand or salt on walking paths during winter.  Clean up any spills in your garage right away. This includes oil or grease spills. What can I do in the bathroom?  Use night lights.  Install grab bars by the toilet and in the tub and shower. Do not use towel bars as grab bars.  Use non-skid mats or decals in the tub or shower.  If you need to sit down in the shower, use a plastic, non-slip stool.  Keep the floor dry. Clean up any water that spills on the floor as soon as it happens.  Remove soap buildup in the tub or shower regularly.  Attach bath mats  securely with double-sided non-slip rug tape.  Do not have throw rugs and other things on the floor that can make you trip. What can I do in the bedroom?  Use night lights.  Make sure that you have a light by your bed that is easy to reach.  Do not use any sheets or blankets that are too big for your bed. They should not hang down onto the floor.  Have a firm chair that has side arms. You can use this for support while you get dressed.  Do not have throw rugs and other things on the floor that can make you trip. What can I do in the kitchen?  Clean up any spills right away.  Avoid walking on wet floors.  Keep items that you use a lot in easy-to-reach places.  If you need to reach something above you, use a strong step stool that has a grab bar.  Keep electrical cords out of the way.  Do not use floor polish or wax that makes floors slippery. If you must use wax, use non-skid floor wax.  Do not have throw rugs and other things on the floor that can make you  trip. What can I do with my stairs?  Do not leave any items on the stairs.  Make sure that there are handrails on both sides of the stairs and use them. Fix handrails that are broken or loose. Make sure that handrails are as long as the stairways.  Check any carpeting to make sure that it is firmly attached to the stairs. Fix any carpet that is loose or worn.  Avoid having throw rugs at the top or bottom of the stairs. If you do have throw rugs, attach them to the floor with carpet tape.  Make sure that you have a light switch at the top of the stairs and the bottom of the stairs. If you do not have them, ask someone to add them for you. What else can I do to help prevent falls?  Wear shoes that:  Do not have high heels.  Have rubber bottoms.  Are comfortable and fit you well.  Are closed at the toe. Do not wear sandals.  If you use a stepladder:  Make sure that it is fully opened. Do not climb a closed  stepladder.  Make sure that both sides of the stepladder are locked into place.  Ask someone to hold it for you, if possible.  Clearly mark and make sure that you can see:  Any grab bars or handrails.  First and last steps.  Where the edge of each step is.  Use tools that help you move around (mobility aids) if they are needed. These include:  Canes.  Walkers.  Scooters.  Crutches.  Turn on the lights when you go into a dark area. Replace any light bulbs as soon as they burn out.  Set up your furniture so you have a clear path. Avoid moving your furniture around.  If any of your floors are uneven, fix them.  If there are any pets around you, be aware of where they are.  Review your medicines with your doctor. Some medicines can make you feel dizzy. This can increase your chance of falling. Ask your doctor what other things that you can do to help prevent falls. This information is not intended to replace advice given to you by your health care provider. Make sure you discuss any questions you have with your health care provider. Document Released: 08/01/2009 Document Revised: 03/12/2016 Document Reviewed: 11/09/2014 Elsevier Interactive Patient Education  2017 [employer].

## 2017-06-14 NOTE — Telephone Encounter (Signed)
Pt was seen in office today for her AWV. Pt states at her last visit she was told she would be receiving a call from [facility] or [facility], regarding an apt for injections in her right knee (Dx osteoarthritis). Advised pt I would speak with doctor and check the status. Referral was sent on 04/09/17 to Dr Rogers Blocker. Please advise, thank you. -MM

## 2017-06-14 NOTE — Progress Notes (Addendum)
Subjective:   Amber Avery is a 71 y.o. female who presents for Medicare Annual (Subsequent) preventive examination.  Review of Systems:  N/A  Cardiac Risk Factors include: advanced age (>21mn, >>41women);diabetes mellitus;dyslipidemia;hypertension;obesity (BMI >30kg/m2)     Objective:     Vitals: BP 132/64 (BP Location: Right Arm)   Pulse 76   Temp (!) 97.3 F (36.3 C) (Oral)   Ht 5' 2"  (1.575 m)   Wt 261 lb 8 oz (118.6 kg)   BMI 47.83 kg/m   Body mass index is 47.83 kg/m.   Tobacco History  Smoking Status  . Former Smoker  . Years: 15.00  Smokeless Tobacco  . Never Used     Counseling given: Not Answered   Past Medical History:  Diagnosis Date  . Asthma   . [place]   . Gout   . Hyperlipidemia   . Hypertension   . PONV (postoperative nausea and vomiting)   . Shortness of breath dyspnea   . Sleep apnea    Past Surgical History:  Procedure Laterality Date  . APPENDECTOMY    . CHOLECYSTECTOMY  1977  . COLONOSCOPY WITH PROPOFOL N/A 01/21/2016   Procedure: COLONOSCOPY WITH PROPOFOL;  Surgeon: DLucilla Lame MD;  Location: [facility] ENDOSCOPY;  Service: Endoscopy;  Laterality: N/A;  . DILATION AND CURETTAGE OF UTERUS     Due to Amenorrhea  . HEMORRHOID BANDING  1980  . HEMORROIDECTOMY    . HERNIA REPAIR  2011  . Temporal Area Excision Biopsy     For Birth Mark Changes, Negative Pathology  . TONSILLECTOMY AND ADENOIDECTOMY     Family History  Problem Relation Age of Onset  . Cancer Mother        brain tumor  . Kidney disease Mother   . Hypertension Mother   . Heart disease Father   . Hypertension Sister   . Arthritis Sister   . Cancer Sister   . Hernia Sister   . GER disease Sister   . Lupus Sister   . Diabetes Sister   . Breast cancer Maternal Aunt 70   History  Sexual Activity  . Sexual activity: Not Currently    Outpatient Encounter Prescriptions as of 06/14/2017  Medication Sig  . albuterol (ACCUNEB)  1.25 MG/3ML nebulizer solution Take 3 mLs (1.25 mg total) by nebulization 2 (two) times daily.  .[name] Kitchenallopurinol (ZYLOPRIM) 300 MG tablet Take 1 tablet (300 mg total) by mouth daily.  .[name] Kitchenaspirin 81 MG tablet Take 1 tablet by mouth daily.  .[name] Kitchenatorvastatin (LIPITOR) 40 MG tablet Daily for cholesterol  . benazepril (LOTENSIN) 20 MG tablet Take 1 tablet (20 mg total) by mouth 2 (two) times daily.  . Blood Glucose Monitoring Suppl (TRUE METRIX AIR GLUCOSE METER) w/Device KIT 1 each by Does not apply route 1 day or 1 dose.  . budesonide (PULMICORT) 0.25 MG/2ML nebulizer solution Take 2 mLs (0.25 mg total) by nebulization 2 (two) times daily.  . colchicine 0.6 MG tablet Take 1 tablet by mouth 2 (two) times daily.  . diclofenac sodium (VOLTAREN) 1 % GEL Apply 4 g topically 4 (four) times daily.  . furosemide (LASIX) 40 MG tablet TAKE 1 TABLET 2 TIMES DAILYAS NEEDED  . gabapentin (NEURONTIN) 300 MG capsule TAKE 1 CAPSULE THREE TIMES DAILY  . glucose blood (TRUE METRIX BLOOD GLUCOSE TEST) test strip Use as instructed  . liraglutide (VICTOZA) 18 MG/3ML SOPN Inject 0.1-0.3 mLs (0.6-1.8 mg total) into the skin daily.  .[name] Kitchen  metFORMIN (GLUCOPHAGE) 500 MG tablet TAKE 1 TAB IN MORNING AND 2 TABS IN EVENING  . potassium chloride SA (K-DUR,KLOR-CON) 20 MEQ tablet Take 1-2 tablets (20-40 mEq total) by mouth daily.  [name] Kitchen triamcinolone cream (KENALOG) 0.1 % Apply 1 application topically as directed.  . triamcinolone ointment (KENALOG) 0.1 % APPLY A PEA SIZE AMOUNT EXTERNALLY TO AREA EVERY OTHER DAY  . TRUEPLUS LANCETS 30G MISC 100 each by Does not apply route 1 day or 1 dose.  . Vitamin D, Cholecalciferol, 1000 UNITS TABS Take 1 capsule by mouth daily.  . [DISCONTINUED] Biotin 5000 MCG CAPS Take 1 capsule by mouth daily.  . [DISCONTINUED] calcipotriene (DOVONOX) 0.005 % cream Apply 1 application topically at bedtime.  . [DISCONTINUED] clobetasol ointment (TEMOVATE) 1.61 % Apply 1 application topically 2 (two) times daily.  .  [DISCONTINUED] Insulin Pen Needle (NOVOFINE) 32G X 6 MM MISC 1 each by Does not apply route daily.   No facility-administered encounter medications on file as of 06/14/2017.     Activities of Daily Living In your present state of health, do you have any difficulty performing the following activities: 06/14/2017 08/07/2016  Hearing? N N  Vision? N N  Difficulty concentrating or making decisions? N N  Walking or climbing stairs? Y Y  Comment - -  Dressing or bathing? N N  Doing errands, shopping? N N  Preparing Food and eating ? N -  Using the Toilet? N -  In the past six months, have you accidently leaked urine? Y -  Comment occasionally - pt states she does not need to wear a pad -  Do you have problems with loss of bowel control? N -  Managing your Medications? N -  Managing your Finances? N -  Housekeeping or managing your Housekeeping? N -  Some recent data might be hidden    Patient Care Team: Steele Sizer, MD as PCP - General (Family Medicine)    Assessment:     Exercise Activities and Dietary recommendations Current Exercise Habits: The patient does not participate in regular exercise at present, Exercise limited by: None identified  Goals    . Exercise 3x per week (30 min per time)          Recommend to start exercising 3 times a week for 20-30 minutes at a time.       Fall Risk Fall Risk  06/14/2017 08/07/2016 06/29/2016 04/07/2016 12/09/2015  Falls in the past year? No No No No No   Depression Screen PHQ 2/9 Scores 06/14/2017 08/07/2016 06/29/2016 04/07/2016  PHQ - 2 Score 0 0 0 0  PHQ- 9 Score - - - -     Cognitive Function        Immunization History  Administered Date(s) Administered  . Influenza, High Dose Seasonal PF 06/19/2015, 06/29/2016  . Influenza-Unspecified 06/17/2011  . Pneumococcal Conjugate-13 11/24/2011, 11/23/2013, 04/09/2017  . Pneumococcal Polysaccharide-23 08/30/2009, 08/30/2009  . Td 08/30/2009  . Tdap 08/30/2009  . Zoster  02/12/2011   Screening Tests Health Maintenance  Topic Date Due  . INFLUENZA VACCINE  05/19/2017  . MAMMOGRAM  08/04/2017  . HEMOGLOBIN A1C  10/09/2017  . OPHTHALMOLOGY EXAM  04/02/2018  . FOOT EXAM  04/09/2018  . TETANUS/TDAP  08/31/2019  . COLONOSCOPY  01/20/2026  . DEXA SCAN  Completed  . Hepatitis C Screening  Completed  . PNA vac Low Risk Adult  Completed      Plan:  I have personally reviewed and addressed the Medicare Annual Wellness  questionnaire and have noted the following in the patient's chart:  A. Medical and social history B. Use of alcohol, tobacco or illicit drugs  C. Current medications and supplements D. Functional ability and status E.  Nutritional status F.  Physical activity G. Advance directives H. List of other physicians I.  Hospitalizations, surgeries, and ER visits in previous 12 months J.  [place] such as hearing and vision if needed, cognitive and depression L. Referrals and appointments - none  In addition, I have reviewed and discussed with patient certain preventive protocols, quality metrics, and best practice recommendations. A written personalized care plan for preventive services as well as general preventive health recommendations were provided to patient.  See attached scanned questionnaire for additional information.   Signed,  Fabio Neighbors, LPN Nurse Health Advisor   MD Recommendations: None, pt to set up mammogram by the end of 2018.  I have reviewed this encounter including the documentation in this note and/or discussed this patient with the provider [facility] . I am certifying that I agree with the content of this note as supervising physician.  Steele Sizer, MD [facility] 06/30/2017, 1:01 PM

## 2017-06-16 ENCOUNTER — Other Ambulatory Visit: Payer: Self-pay | Admitting: Family Medicine

## 2017-06-16 DIAGNOSIS — G4733 Obstructive sleep apnea (adult) (pediatric): Secondary | ICD-10-CM | POA: Diagnosis not present

## 2017-06-16 DIAGNOSIS — Z1231 Encounter for screening mammogram for malignant neoplasm of breast: Secondary | ICD-10-CM

## 2017-07-01 ENCOUNTER — Encounter: Payer: Self-pay | Admitting: Family Medicine

## 2017-07-01 ENCOUNTER — Ambulatory Visit (INDEPENDENT_AMBULATORY_CARE_PROVIDER_SITE_OTHER): Payer: Medicare HMO | Admitting: Family Medicine

## 2017-07-01 VITALS — BP 122/68 | HR 90 | Temp 97.5°F | Resp 16 | Ht 62.0 in | Wt 259.3 lb

## 2017-07-01 DIAGNOSIS — Z1231 Encounter for screening mammogram for malignant neoplasm of breast: Secondary | ICD-10-CM

## 2017-07-01 DIAGNOSIS — E2839 Other primary ovarian failure: Secondary | ICD-10-CM

## 2017-07-01 DIAGNOSIS — Z0000 Encounter for general adult medical examination without abnormal findings: Secondary | ICD-10-CM

## 2017-07-01 DIAGNOSIS — Z1239 Encounter for other screening for malignant neoplasm of breast: Secondary | ICD-10-CM

## 2017-07-01 NOTE — Progress Notes (Signed)
Name: Amber Avery   MRN: [id number]    DOB: Oct 21, 1945   Date:07/01/2017       Progress Note  Subjective  Chief Complaint  Chief Complaint  Patient presents with  . Medicare Wellness    HPI  Well Woman: she has been losing some weight since started on Victoza , only on 0.6 mg because of cost of medication. She has not been sexually active, no breat lumps or nipple discharge. No change in bowel movements. Had medicare wellness recently  Patient Active Problem List   Diagnosis Date Noted  . Anemia, unspecified 04/12/2017  . Lichen sclerosus of female genitalia 06/29/2016  . Primary osteoarthritis of both knees 04/07/2016  . Special screening for malignant neoplasms, colon   . Right thyroid nodule 06/26/2015  . Asthma, intermittent 04/03/2015  . Benign essential HTN 04/03/2015  . Carpal tunnel syndrome 04/03/2015  . Chronic kidney disease (CKD), stage II (mild) 04/03/2015  . Controlled gout 04/03/2015  . Arteriosclerosis of coronary artery 04/03/2015  . Diabetes mellitus with renal manifestations, controlled ([place]) 04/03/2015  . Dyslipidemia 04/03/2015  . Edema extremities 04/03/2015  . Elevated sedimentation rate 04/03/2015  . Knee pain 04/03/2015  . Lumbar radiculitis 04/03/2015  . Obstructive apnea 04/03/2015  . Lumbosacral spondylosis 04/03/2015  . Osteoarthritis, chronic 04/03/2015  . Psoriasis 04/03/2015  . Vitamin D deficiency 04/03/2015  . Varicose veins 04/03/2015  . Goiter 12/23/2014  . Degeneration of intervertebral disc of lumbar region 08/23/2014  . Corns and callosity 06/11/2009  . Extreme obesity 04/11/2007    Past Surgical History:  Procedure Laterality Date  . APPENDECTOMY    . CHOLECYSTECTOMY  1977  . COLONOSCOPY WITH PROPOFOL N/A 01/21/2016   Procedure: COLONOSCOPY WITH PROPOFOL;  Surgeon: Lucilla Lame, MD;  Location: [facility] ENDOSCOPY;  Service: Endoscopy;  Laterality: N/A;  . DILATION AND CURETTAGE OF UTERUS     Due to Amenorrhea  . HEMORRHOID  BANDING  1980  . HEMORROIDECTOMY    . HERNIA REPAIR  2011  . Temporal Area Excision Biopsy     For Birth Mark Changes, Negative Pathology  . TONSILLECTOMY AND ADENOIDECTOMY      Family History  Problem Relation Age of Onset  . Cancer Mother        brain tumor  . Kidney disease Mother   . Hypertension Mother   . Heart disease Father   . Hypertension Sister   . Arthritis Sister   . Cancer Sister   . Hernia Sister   . GER disease Sister   . Lupus Sister   . Diabetes Sister   . Breast cancer Maternal Aunt 70    Social History   Social History  . Marital status: Divorced    Spouse name: N/A  . Number of children: N/A  . Years of education: N/A   Occupational History  . Not on file.   Social History Main Topics  . Smoking status: Former Smoker    Years: 15.00  . Smokeless tobacco: Never Used  . Alcohol use 0.0 oz/week     Comment: rare wine  . Drug use: No  . Sexual activity: Not Currently   Other Topics Concern  . Not on file   Social History Narrative  . No narrative on file     Current Outpatient Prescriptions:  .  albuterol (ACCUNEB) 1.25 MG/3ML nebulizer solution, Take 3 mLs (1.25 mg total) by nebulization 2 (two) times daily., Disp: 75 mL, Rfl: 2 .  allopurinol (ZYLOPRIM) 300 MG tablet, Take  1 tablet (300 mg total) by mouth daily., Disp: 90 tablet, Rfl: 4 .  aspirin 81 MG tablet, Take 1 tablet by mouth daily., Disp: , Rfl:  .  atorvastatin (LIPITOR) 40 MG tablet, Daily for cholesterol, Disp: 90 tablet, Rfl: 3 .  benazepril (LOTENSIN) 20 MG tablet, Take 1 tablet (20 mg total) by mouth 2 (two) times daily., Disp: 180 tablet, Rfl: 3 .  Blood Glucose Monitoring Suppl (TRUE METRIX AIR GLUCOSE METER) w/Device KIT, 1 each by Does not apply route 1 day or 1 dose., Disp: 1 kit, Rfl: 0 .  budesonide (PULMICORT) 0.25 MG/2ML nebulizer solution, Take 2 mLs (0.25 mg total) by nebulization 2 (two) times daily., Disp: 60 mL, Rfl: 2 .  colchicine 0.6 MG tablet, Take 1  tablet by mouth 2 (two) times daily., Disp: , Rfl:  .  diclofenac sodium (VOLTAREN) 1 % GEL, Apply 4 g topically 4 (four) times daily., Disp: 100 g, Rfl: 1 .  furosemide (LASIX) 40 MG tablet, TAKE 1 TABLET 2 TIMES DAILYAS NEEDED, Disp: 180 tablet, Rfl: 0 .  gabapentin (NEURONTIN) 300 MG capsule, TAKE 1 CAPSULE THREE TIMES DAILY, Disp: 270 capsule, Rfl: 3 .  glucose blood (TRUE METRIX BLOOD GLUCOSE TEST) test strip, Use as instructed, Disp: 100 each, Rfl: 12 .  liraglutide (VICTOZA) 18 MG/3ML SOPN, Inject 0.1-0.3 mLs (0.6-1.8 mg total) into the skin daily., Disp: 18 mL, Rfl: 1 .  metFORMIN (GLUCOPHAGE) 500 MG tablet, TAKE 1 TAB IN MORNING AND 2 TABS IN EVENING, Disp: 270 tablet, Rfl: 1 .  potassium chloride SA (K-DUR,KLOR-CON) 20 MEQ tablet, Take 1-2 tablets (20-40 mEq total) by mouth daily., Disp: 180 tablet, Rfl: 3 .  triamcinolone cream (KENALOG) 0.1 %, Apply 1 application topically as directed., Disp: , Rfl:  .  triamcinolone ointment (KENALOG) 0.1 %, APPLY A PEA SIZE AMOUNT EXTERNALLY TO AREA EVERY OTHER DAY, Disp: 90 g, Rfl: 1 .  TRUEPLUS LANCETS 30G MISC, 100 each by Does not apply route 1 day or 1 dose., Disp: 100 each, Rfl: 12 .  Vitamin D, Cholecalciferol, 1000 UNITS TABS, Take 1 capsule by mouth daily., Disp: , Rfl:   Allergies  Allergen Reactions  . Codeine      ROS  Constitutional: Negative for fever , positive for mild weight change.  Respiratory: Negative for cough and shortness of breath.   Cardiovascular: Negative for chest pain or palpitations.  Gastrointestinal: Negative for abdominal pain, no bowel changes.  Musculoskeletal: Negative for gait problem or joint swelling.  Skin: Positive  for rash left elbow, much better than it used to be  Neurological: Negative for dizziness , positive for mild intermittent headache ( improved with rx glasses).  No other specific complaints in a complete review of systems (except as listed in HPI above).  Objective  Vitals:    07/01/17 1133  BP: 122/68  Pulse: 90  Resp: 16  Temp: (!) 97.5 F (36.4 C)  SpO2: 94%  Weight: 259 lb 5 oz (117.6 kg)  Height: _0  (1.575 m)    Body mass index is 47.43 kg/m.  Physical Exam  Constitutional: Patient appears well-developed and obese No distress.  HENT: Head: Normocephalic and atraumatic. Ears: B TMs ok, no erythema or effusion; Nose: Nose normal. Mouth/Throat: Oropharynx is clear and moist. No oropharyngeal exudate.  Eyes: Conjunctivae and EOM are normal. Pupils are equal, round, and reactive to light. No scleral icterus.  Neck: Normal range of motion. Neck supple. No JVD present. No thyromegaly present.  Cardiovascular:  Normal rate, regular rhythm and normal heart sounds.  No murmur heard. No BLE edema. Pulmonary/Chest: Effort normal and breath sounds normal. No respiratory distress. Abdominal: Soft. Bowel sounds are normal, no distension. There is no tenderness. no masses Breast: no lumps or masses, no nipple discharge or rashes FEMALE GENITALIA:  Normal external exam  RECTAL: not done Musculoskeletal: Normal range of motion, no joint effusions. Slow gait Neurological: he is alert and oriented to person, place, and time. No cranial nerve deficit. Coordination, balance, strength, speech and gait are normal.  Skin: Skin is warm and dry. Dry spot on left elbow. No erythema.  Psychiatric: Patient has a normal mood and affect. behavior is normal. Judgment and thought content normal.  Recent Results (from the past 2160 hour(s))  POCT HgB A1C     Status: Abnormal   Collection Time: 04/09/17 10:21 AM  Result Value Ref Range   Hemoglobin A1C 7.0   Magnesium     Status: None   Collection Time: 04/09/17 10:38 AM  Result Value Ref Range   Magnesium 1.9 1.5 - 2.5 mg/dL  COMPLETE METABOLIC PANEL WITH GFR     Status: Abnormal   Collection Time: 04/09/17 11:09 AM  Result Value Ref Range   Sodium 141 135 - 146 mmol/L   Potassium 5.0 3.5 - 5.3 mmol/L   Chloride 105 98 -  110 mmol/L   CO2 26 20 - 31 mmol/L   Glucose, Bld 92 65 - 99 mg/dL   BUN 37 (H) 7 - 25 mg/dL   Creat 1.20 (H) 0.50 - 0.99 mg/dL    Comment:   For patients > or = 70 years of age: The upper reference limit for Creatinine is approximately 13% higher for people identified as African-American.      Total Bilirubin 0.5 0.2 - 1.2 mg/dL   Alkaline Phosphatase 93 33 - 130 U/L   AST 16 10 - 35 U/L   ALT 13 6 - 29 U/L   Total Protein 6.5 6.1 - 8.1 g/dL   Albumin 4.0 3.6 - 5.1 g/dL   Calcium 9.4 8.6 - 10.4 mg/dL   GFR, Est African American 53 (L) >=60 mL/min   GFR, Est Non African American 46 (L) >=60 mL/min  CBC with Differential/Platelet     Status: Abnormal   Collection Time: 04/09/17 11:09 AM  Result Value Ref Range   WBC 6.7 3.8 - 10.8 K/uL   RBC 3.81 3.80 - 5.10 MIL/uL   Hemoglobin 10.8 (L) 11.7 - 15.5 g/dL   HCT 34.4 (L) 35.0 - 45.0 %   MCV 90.3 80.0 - 100.0 fL   MCH 28.3 27.0 - 33.0 pg   MCHC 31.4 (L) 32.0 - 36.0 g/dL   RDW 17.1 (H) 11.0 - 15.0 %   Platelets 239 140 - 400 K/uL   MPV 9.7 7.5 - 12.5 fL   Neutro Abs 4,556 1,500 - 7,800 cells/uL   Lymphs Abs 1,407 850 - 3,900 cells/uL   Monocytes Absolute 402 200 - 950 cells/uL   Eosinophils Absolute 335 15 - 500 cells/uL   Basophils Absolute 0 0 - 200 cells/uL   Neutrophils Relative % 68 %   Lymphocytes Relative 21 %   Monocytes Relative 6 %   Eosinophils Relative 5 %   Basophils Relative 0 %   Smear Review Criteria for review not met   VITAMIN D 25 Hydroxy (Vit-D Deficiency, Fractures)     Status: None   Collection Time: 04/09/17 11:09 AM  Result Value  Ref Range   Vit D, 25-Hydroxy 41 30 - 100 ng/mL    Comment: Vitamin D Status           25-OH Vitamin D        Deficiency                <20 ng/mL        Insufficiency         20 - 29 ng/mL        Optimal             > or = 30 ng/mL   For 25-OH Vitamin D testing on patients on D2-supplementation and patients for whom quantitation of D2 and D3 fractions is required,  the QuestAssureD 25-OH VIT D, (D2,D3), LC/MS/MS is recommended: order code [id number] (patients > 2 yrs).   Lipid panel     Status: Abnormal   Collection Time: 04/09/17 11:09 AM  Result Value Ref Range   Cholesterol 128 <200 mg/dL   Triglycerides 102 <150 mg/dL   HDL 49 (L) >50 mg/dL   Total CHOL/HDL Ratio 2.6 <5.0 Ratio   VLDL 20 <30 mg/dL   LDL Cholesterol 59 <100 mg/dL  Uric acid     Status: None   Collection Time: 04/09/17 11:09 AM  Result Value Ref Range   Uric Acid, Serum 5.5 2.5 - 7.0 mg/dL  Iron, TIBC and Ferritin Panel     Status: Abnormal   Collection Time: 04/09/17 11:09 AM  Result Value Ref Range   Ferritin 18 (L) 20 - 288 ng/mL   Iron 51 45 - 160 ug/dL   TIBC 448 250 - 450 ug/dL   %SAT 11 11 - 50 %  POC Hemoccult Bld/Stl (3-Cd Home Screen)     Status: Normal   Collection Time: 04/27/17  3:39 PM  Result Value Ref Range   Card #1 Date 04/25/2017    Fecal Occult Blood, POC Negative Negative   Card #2 Date 04/26/2017    Card #2 Fecal Occult Blod, POC Negative    Card #3 Date 04/27/2017    Card #3 Fecal Occult Blood, POC Negative      PHQ2/9: Depression screen [facility] 2/9 07/01/2017 06/14/2017 08/07/2016 06/29/2016 04/07/2016  Decreased Interest 0 0 0 0 0  Down, Depressed, Hopeless 0 0 0 0 0  PHQ - 2 Score 0 0 0 0 0  Altered sleeping - - - - -  Tired, decreased energy - - - - -  Change in appetite - - - - -  Feeling bad or failure about yourself  - - - - -  Trouble concentrating - - - - -  Moving slowly or fidgety/restless - - - - -  Suicidal thoughts - - - - -  PHQ-9 Score - - - - -  Difficult doing work/chores - - - - -     Fall Risk: Fall Risk  07/01/2017 06/14/2017 08/07/2016 06/29/2016 04/07/2016  Falls in the past year? _0     Functional Status Survey: Is the patient deaf or have difficulty hearing?: No Does the patient have difficulty concentrating, remembering, or making decisions?: No Does the patient have difficulty walking or climbing stairs?:  Yes Does the patient have difficulty dressing or bathing?: No Does the patient have difficulty doing errands alone such as visiting a doctor's office or shopping?: No    Assessment & Plan  1. Annual physical exam  Discussed importance of 150 minutes of physical activity weekly, eat  two servings of fish weekly, eat one serving of tree nuts ( cashews, pistachios, pecans, almonds.[name] Kitchen) every other day, eat 6 servings of fruit/vegetables daily and drink plenty of water and avoid sweet beverages.  - DG Bone Density; Future  2. Breast cancer screening  She scheduled mammogram for October already   3. Ovarian failure  - DG Bone Density; Future

## 2017-07-01 NOTE — Patient Instructions (Signed)
Preventive Care 65 Years and Older, Female Preventive care refers to lifestyle choices and visits with your health care provider that can promote health and wellness. What does preventive care include?  A yearly physical exam. This is also called an annual well check.  Dental exams once or twice a year.  Routine eye exams. Ask your health care provider how often you should have your eyes checked.  Personal lifestyle choices, including: ? Daily care of your teeth and gums. ? Regular physical activity. ? Eating a healthy diet. ? Avoiding tobacco and drug use. ? Limiting alcohol use. ? Practicing safe sex. ? Taking low-dose aspirin every day. ? Taking vitamin and mineral supplements as recommended by your health care provider. What happens during an annual well check? The services and screenings done by your health care provider during your annual well check will depend on your age, overall health, lifestyle risk factors, and family history of disease. Counseling Your health care provider may ask you questions about your:  Alcohol use.  Tobacco use.  Drug use.  Emotional well-being.  Home and relationship well-being.  Sexual activity.  Eating habits.  History of falls.  Memory and ability to understand (cognition).  Work and work environment.  Reproductive health.  Screening You may have the following tests or measurements:  Height, weight, and BMI.  Blood pressure.  Lipid and cholesterol levels. These may be checked every 5 years, or more frequently if you are over 50 years old.  Skin check.  Lung cancer screening. You may have this screening every year starting at age 55 if you have a 30-pack-year history of smoking and currently smoke or have quit within the past 15 years.  Fecal occult blood test (FOBT) of the stool. You may have this test every year starting at age 50.  Flexible sigmoidoscopy or colonoscopy. You may have a sigmoidoscopy every 5 years or  a colonoscopy every 10 years starting at age 50.  Hepatitis C blood test.  Hepatitis B blood test.  Sexually transmitted disease (STD) testing.  Diabetes screening. This is done by checking your blood sugar (glucose) after you have not eaten for a while (fasting). You may have this done every 1-3 years.  Bone density scan. This is done to screen for osteoporosis. You may have this done starting at age 65.  Mammogram. This may be done every 1-2 years. Talk to your health care provider about how often you should have regular mammograms.  Talk with your health care provider about your test results, treatment options, and if necessary, the need for more tests. Vaccines Your health care provider may recommend certain vaccines, such as:  Influenza vaccine. This is recommended every year.  Tetanus, diphtheria, and acellular pertussis (Tdap, Td) vaccine. You may need a Td booster every 10 years.  Varicella vaccine. You may need this if you have not been vaccinated.  Zoster vaccine. You may need this after age 60.  Measles, mumps, and rubella (MMR) vaccine. You may need at least one dose of MMR if you were born in 1957 or later. You may also need a second dose.  Pneumococcal 13-valent conjugate (PCV13) vaccine. One dose is recommended after age 65.  Pneumococcal polysaccharide (PPSV23) vaccine. One dose is recommended after age 65.  Meningococcal vaccine. You may need this if you have certain conditions.  Hepatitis A vaccine. You may need this if you have certain conditions or if you travel or work in places where you may be exposed to hepatitis   A.  Hepatitis B vaccine. You may need this if you have certain conditions or if you travel or work in places where you may be exposed to hepatitis B.  Haemophilus influenzae type b (Hib) vaccine. You may need this if you have certain conditions.  Talk to your health care provider about which screenings and vaccines you need and how often you  need them. This information is not intended to replace advice given to you by your health care provider. Make sure you discuss any questions you have with your health care provider. Document Released: 11/01/2015 Document Revised: 06/24/2016 Document Reviewed: 08/06/2015 Elsevier Interactive Patient Education  2017 [employer].

## 2017-07-02 ENCOUNTER — Encounter: Payer: Medicare HMO | Admitting: Family Medicine

## 2017-07-05 ENCOUNTER — Telehealth: Payer: Self-pay | Admitting: Family Medicine

## 2017-07-05 NOTE — Telephone Encounter (Signed)
Pt has new glucose meter (one touch zerio). She is requesting prescriptions for lancets and testing strips. Please send to [employer] [phone]

## 2017-07-06 DIAGNOSIS — R69 Illness, unspecified: Secondary | ICD-10-CM | POA: Diagnosis not present

## 2017-07-06 MED ORDER — GLUCOSE BLOOD VI STRP: ORAL_STRIP | 12 refills | Status: DC

## 2017-07-06 MED ORDER — ONETOUCH ULTRASOFT LANCETS MISC: 12 refills | Status: DC

## 2017-07-07 DIAGNOSIS — M25561 Pain in right knee: Secondary | ICD-10-CM | POA: Diagnosis not present

## 2017-07-07 DIAGNOSIS — M170 Bilateral primary osteoarthritis of knee: Secondary | ICD-10-CM | POA: Diagnosis not present

## 2017-07-07 DIAGNOSIS — Z6841 Body Mass Index (BMI) 40.0 and over, adult: Secondary | ICD-10-CM | POA: Diagnosis not present

## 2017-07-07 DIAGNOSIS — M25562 Pain in left knee: Secondary | ICD-10-CM | POA: Diagnosis not present

## 2017-07-12 ENCOUNTER — Telehealth: Payer: Self-pay | Admitting: Pharmacy Technician

## 2017-07-12 NOTE — Telephone Encounter (Signed)
Patient not sure if she is in Medicare Part D coverage gap.  Provided patient with application for [facility] along with eligibility checklist.  [facility]

## 2017-07-17 DIAGNOSIS — G4733 Obstructive sleep apnea (adult) (pediatric): Secondary | ICD-10-CM | POA: Diagnosis not present

## 2017-08-02 ENCOUNTER — Ambulatory Visit: Payer: Self-pay

## 2017-08-02 ENCOUNTER — Ambulatory Visit: Payer: Medicare HMO | Admitting: Pharmacist

## 2017-08-02 DIAGNOSIS — Z79899 Other long term (current) drug therapy: Secondary | ICD-10-CM

## 2017-08-04 NOTE — Progress Notes (Signed)
Amber Avery is here today to review Medicare Part D/Advantage plans. She currently has [employer] but is interested in reviewing the available options for 2019. This plan has a zero monthly premium and a $95 annual medication deductible.  She thought that she was in the coverage gap, however, she has not reached the gap as her total medication costs for the year were less than $500.  Patient's current medication list was reviewed and all of her medications except Victoza and Vitamin D can be obtained through her current Advantage plan. Most of her generics have a zero dollar copay. The colchicine and diclofenac are covered at a much higher copay.  Medicare.gov was used to review the available Part D/Advantage plans. Patient does not qualify for any subsidy. Her primary care provider is Dr. Ancil Boozer.  Prior to Admission medications   Medication Sig Start Date End Date Taking? Authorizing Provider  albuterol (ACCUNEB) 1.25 MG/3ML nebulizer solution Take 3 mLs (1.25 mg total) by nebulization 2 (two) times daily. 12/11/16  Yes Sowles, Drue Stager, MD  allopurinol (ZYLOPRIM) 300 MG tablet Take 1 tablet (300 mg total) by mouth daily. 12/11/16  Yes Steele Sizer, MD  aspirin 81 MG tablet Take 1 tablet by mouth daily. 03/26/15  Yes [provider]  atorvastatin (LIPITOR) 40 MG tablet Daily for cholesterol 12/11/16  Yes Sowles, Drue Stager, MD  benazepril (LOTENSIN) 20 MG tablet Take 1 tablet (20 mg total) by mouth 2 (two) times daily. 12/11/16  Yes Sowles, Drue Stager, MD  Blood Glucose Monitoring Suppl (TRUE METRIX AIR GLUCOSE METER) w/Device KIT 1 each by Does not apply route 1 day or 1 dose. 05/20/17  Yes Sowles, Drue Stager, MD  budesonide (PULMICORT) 0.25 MG/2ML nebulizer solution Take 2 mLs (0.25 mg total) by nebulization 2 (two) times daily. 12/11/16  Yes Sowles, Drue Stager, MD  colchicine 0.6 MG tablet Take 1 tablet by mouth 2 (two) times daily. Takes as needed for flares   Yes  [provider]  diclofenac sodium (VOLTAREN) 1 % GEL Apply 4 g topically 4 (four) times daily. 12/11/16  Yes Sowles, Drue Stager, MD  furosemide (LASIX) 40 MG tablet TAKE 1 TABLET 2 TIMES DAILYAS NEEDED 03/21/17  Yes Hubbard Hartshorn, FNP  gabapentin (NEURONTIN) 300 MG capsule TAKE 1 CAPSULE THREE TIMES DAILY 12/11/16  Yes Sowles, Drue Stager, MD  glucose blood test strip Use as instructed 07/06/17  Yes Sowles, Drue Stager, MD  Lancets [facility] ULTRASOFT) lancets Use as instructed 07/06/17  Yes Sowles, Drue Stager, MD  liraglutide (VICTOZA) 18 MG/3ML SOPN Inject 0.1-0.3 mLs (0.6-1.8 mg total) into the skin daily. 12/15/16  Yes Sowles, Drue Stager, MD  metFORMIN (GLUCOPHAGE) 500 MG tablet TAKE 1 TAB IN MORNING AND 2 TABS IN EVENING 12/11/16  Yes Sowles, Drue Stager, MD  potassium chloride SA (K-DUR,KLOR-CON) 20 MEQ tablet Take 1-2 tablets (20-40 mEq total) by mouth daily. 05/20/17  Yes Sowles, Drue Stager, MD  triamcinolone cream (KENALOG) 0.1 % Apply 1 application topically as directed.   Yes [provider]  triamcinolone ointment (KENALOG) 0.1 % APPLY A PEA SIZE AMOUNT EXTERNALLY TO AREA EVERY OTHER DAY 12/11/16  Yes Sowles, Drue Stager, MD  Vitamin D, Cholecalciferol, 1000 UNITS TABS Take 1 capsule by mouth daily. 03/28/15  Yes [provider]     PLAN:  Due to time constraints, Amber Avery will return on 09/13/17 to further discuss the available plans that suit her medical and medication needs.  Amber Avery, PharmD [facility] [place] Operations Coordinator [phone]

## 2017-08-05 ENCOUNTER — Ambulatory Visit
Admission: RE | Admit: 2017-08-05 | Discharge: 2017-08-05 | Disposition: A | Discharge status: Home / Self Care | Payer: Medicare HMO | Source: Ambulatory Visit | Attending: Family Medicine | Admitting: Family Medicine

## 2017-08-05 DIAGNOSIS — Z1231 Encounter for screening mammogram for malignant neoplasm of breast: Secondary | ICD-10-CM

## 2017-08-05 DIAGNOSIS — M85851 Other specified disorders of bone density and structure, right thigh: Secondary | ICD-10-CM | POA: Diagnosis not present

## 2017-08-05 DIAGNOSIS — Z0000 Encounter for general adult medical examination without abnormal findings: Secondary | ICD-10-CM

## 2017-08-05 DIAGNOSIS — E2839 Other primary ovarian failure: Secondary | ICD-10-CM

## 2017-08-05 DIAGNOSIS — Z780 Asymptomatic menopausal state: Secondary | ICD-10-CM | POA: Diagnosis not present

## 2017-08-09 ENCOUNTER — Encounter: Payer: Self-pay | Admitting: Family Medicine

## 2017-08-09 ENCOUNTER — Ambulatory Visit (INDEPENDENT_AMBULATORY_CARE_PROVIDER_SITE_OTHER): Payer: Medicare HMO | Admitting: Family Medicine

## 2017-08-09 VITALS — BP 106/62 | HR 83 | Temp 97.5°F | Resp 16 | Ht 62.0 in | Wt 253.4 lb

## 2017-08-09 DIAGNOSIS — E785 Hyperlipidemia, unspecified: Secondary | ICD-10-CM

## 2017-08-09 DIAGNOSIS — I10 Essential (primary) hypertension: Secondary | ICD-10-CM | POA: Diagnosis not present

## 2017-08-09 DIAGNOSIS — R809 Proteinuria, unspecified: Secondary | ICD-10-CM

## 2017-08-09 DIAGNOSIS — M170 Bilateral primary osteoarthritis of knee: Secondary | ICD-10-CM | POA: Diagnosis not present

## 2017-08-09 DIAGNOSIS — J4540 Moderate persistent asthma, uncomplicated: Secondary | ICD-10-CM | POA: Diagnosis not present

## 2017-08-09 DIAGNOSIS — R600 Localized edema: Secondary | ICD-10-CM

## 2017-08-09 DIAGNOSIS — E1129 Type 2 diabetes mellitus with other diabetic kidney complication: Secondary | ICD-10-CM

## 2017-08-09 DIAGNOSIS — M109 Gout, unspecified: Secondary | ICD-10-CM | POA: Diagnosis not present

## 2017-08-09 LAB — POCT GLYCOSYLATED HEMOGLOBIN (HGB A1C): Hemoglobin A1C: 6.3

## 2017-08-09 LAB — POCT UA - MICROALBUMIN: Microalbumin Ur, POC: 20 mg/L

## 2017-08-09 MED ORDER — ACETAMINOPHEN 500 MG PO TABS: 500.0000 mg | ORAL_TABLET | Freq: Four times a day (QID) | ORAL | 0 refills | Status: AC | PRN

## 2017-08-09 MED ORDER — LIRAGLUTIDE 18 MG/3ML ~~LOC~~ SOPN: 0.6000 mg | PEN_INJECTOR | Freq: Every day | SUBCUTANEOUS | 1 refills | Status: DC

## 2017-08-09 MED ORDER — FUROSEMIDE 40 MG PO TABS: ORAL_TABLET | ORAL | 1 refills | Status: DC

## 2017-08-09 MED ORDER — DICLOFENAC SODIUM 1 % TD GEL: 4.0000 g | Freq: Four times a day (QID) | TRANSDERMAL | 1 refills | Status: DC

## 2017-08-09 NOTE — Progress Notes (Signed)
Name: Amber Avery   MRN: [id number]    DOB: Mar 31, 1946   Date:08/09/2017       Progress Note  Subjective  Chief Complaint  Chief Complaint  Patient presents with  . Medication Refill    4 month F/U  . Sleep Apnea    Doing well with CPAP  . Dyslipidemia  . Obesity    Has been losed 6 pounds with Victoza,  . Gait Instability  . Back Pain    Has been bothering her alot, was told she has to lose weight before she could have surgery. Has inject in her knees  . Asthma  . Diabetes    BS Average for the past 90 day has been 116,  Lowest-71 Highest-173    HPI  Dyslipidemia: taking statin therapy, no side effects, no chest pain. Last labs is at goal back in 03/2017  Asthma: off inhalers, doing better, no wheezing, cough at this time, she has decrease in exercise tolerance but unchanged  DM with renal manifestation: HgbA1C was down to 6.9%,  7.2,, 7.0%% down to 6.3%.She is on metformin, and  Victoza since 11/2016, but only on 0.6, advised to increase to 1.2. Tolerating medication well,  She denies polyphagia, polydipsia or polyuria. Eye exam is up to dateOn Ace for CKI used to have proteinuria. Weight is down 6 lbs since last visit , and 9 lbs in the past 4 months.  Obesity: she is trying to walk more often and lost 6 lbs  lbs since last visit - 12 lbs since Feb when she started on Victoza 11/2016  OSA: she wear machine every night, she has a new mask and uses oxygen with CPAP.She is doing well on new mask. Wakes up feeling rested  Right thyroid nodule; seen by Endo at [facility], biopsy done and negative   Varicose Veins: present for years and now has pain on left groin and upper thigh, bulging varicose. Seen by Vascular Surgeon but procedure will not be covered and she can't afford it .Edema is better controlled, almost gone with weight loss  Gait instability: she uses a cane, they moved mailbox near her house and is doing well. Still has pain on both knees from OA.   OA :  she is having more pain on right knee and would like to see Ortho , not ready for surgery but would like to consider injections, Ortho recommended Meloxicam, but we will try Tylenol instead to avoid kidney problems.   Low back pain: she states back pain is better since epidural injection, radiculitis resolved, on Gabapentin   Patient Active Problem List   Diagnosis Date Noted  . Anemia, unspecified 04/12/2017  . Lichen sclerosus of female genitalia 06/29/2016  . Primary osteoarthritis of both knees 04/07/2016  . Special screening for malignant neoplasms, colon   . Right thyroid nodule 06/26/2015  . Asthma, intermittent 04/03/2015  . Benign essential HTN 04/03/2015  . Carpal tunnel syndrome 04/03/2015  . Chronic kidney disease (CKD), stage II (mild) 04/03/2015  . Controlled gout 04/03/2015  . Arteriosclerosis of coronary artery 04/03/2015  . Diabetes mellitus with renal manifestations, controlled ([place]) 04/03/2015  . Dyslipidemia 04/03/2015  . Edema extremities 04/03/2015  . Elevated sedimentation rate 04/03/2015  . Knee pain 04/03/2015  . Lumbar radiculitis 04/03/2015  . Obstructive apnea 04/03/2015  . Lumbosacral spondylosis 04/03/2015  . Osteoarthritis, chronic 04/03/2015  . Psoriasis 04/03/2015  . Vitamin D deficiency 04/03/2015  . Varicose veins 04/03/2015  . Goiter 12/23/2014  . Degeneration  of intervertebral disc of lumbar region 08/23/2014  . Corns and callosity 06/11/2009  . Extreme obesity 04/11/2007    Past Surgical History:  Procedure Laterality Date  . APPENDECTOMY    . CHOLECYSTECTOMY  1977  . COLONOSCOPY WITH PROPOFOL N/A 01/21/2016   Procedure: COLONOSCOPY WITH PROPOFOL;  Surgeon: Lucilla Lame, MD;  Location: [facility] ENDOSCOPY;  Service: Endoscopy;  Laterality: N/A;  . DILATION AND CURETTAGE OF UTERUS     Due to Amenorrhea  . HEMORRHOID BANDING  1980  . HEMORROIDECTOMY    . HERNIA REPAIR  2011  . Temporal Area Excision Biopsy     For Birth Mark Changes,  Negative Pathology  . TONSILLECTOMY AND ADENOIDECTOMY      Family History  Problem Relation Age of Onset  . Cancer Mother        brain tumor  . Kidney disease Mother   . Hypertension Mother   . Heart disease Father   . Hypertension Sister   . Arthritis Sister   . Cancer Sister   . Hernia Sister   . GER disease Sister   . Lupus Sister   . Diabetes Sister   . Breast cancer Maternal Aunt 70    Social History   Social History  . Marital status: Divorced    Spouse name: N/A  . Number of children: N/A  . Years of education: N/A   Occupational History  . Not on file.   Social History Main Topics  . Smoking status: Former Smoker    Years: 15.00  . Smokeless tobacco: Never Used  . Alcohol use 0.0 oz/week     Comment: rare wine  . Drug use: No  . Sexual activity: Not Currently   Other Topics Concern  . Not on file   Social History Narrative  . No narrative on file     Current Outpatient Prescriptions:  .  albuterol (ACCUNEB) 1.25 MG/3ML nebulizer solution, Take 3 mLs (1.25 mg total) by nebulization 2 (two) times daily., Disp: 75 mL, Rfl: 2 .  allopurinol (ZYLOPRIM) 300 MG tablet, Take 1 tablet (300 mg total) by mouth daily., Disp: 90 tablet, Rfl: 4 .  aspirin 81 MG tablet, Take 1 tablet by mouth daily., Disp: , Rfl:  .  atorvastatin (LIPITOR) 40 MG tablet, Daily for cholesterol, Disp: 90 tablet, Rfl: 3 .  benazepril (LOTENSIN) 20 MG tablet, Take 1 tablet (20 mg total) by mouth 2 (two) times daily., Disp: 180 tablet, Rfl: 3 .  Blood Glucose Monitoring Suppl (TRUE METRIX AIR GLUCOSE METER) w/Device KIT, 1 each by Does not apply route 1 day or 1 dose., Disp: 1 kit, Rfl: 0 .  budesonide (PULMICORT) 0.25 MG/2ML nebulizer solution, Take 2 mLs (0.25 mg total) by nebulization 2 (two) times daily., Disp: 60 mL, Rfl: 2 .  colchicine 0.6 MG tablet, Take 1 tablet by mouth 2 (two) times daily. Takes as needed for flares, Disp: , Rfl:  .  gabapentin (NEURONTIN) 300 MG capsule, TAKE  1 CAPSULE THREE TIMES DAILY, Disp: 270 capsule, Rfl: 3 .  glucose blood test strip, Use as instructed, Disp: 100 each, Rfl: 12 .  Lancets (ONETOUCH ULTRASOFT) lancets, Use as instructed, Disp: 100 each, Rfl: 12 .  metFORMIN (GLUCOPHAGE) 500 MG tablet, TAKE 1 TAB IN MORNING AND 2 TABS IN EVENING, Disp: 270 tablet, Rfl: 1 .  potassium chloride SA (K-DUR,KLOR-CON) 20 MEQ tablet, Take 1-2 tablets (20-40 mEq total) by mouth daily., Disp: 180 tablet, Rfl: 3 .  triamcinolone cream (KENALOG) 0.1 %,  Apply 1 application topically as directed., Disp: , Rfl:  .  triamcinolone ointment (KENALOG) 0.1 %, APPLY A PEA SIZE AMOUNT EXTERNALLY TO AREA EVERY OTHER DAY, Disp: 90 g, Rfl: 1 .  Vitamin D, Cholecalciferol, 1000 UNITS TABS, Take 1 capsule by mouth daily., Disp: , Rfl:  .  acetaminophen (TYLENOL) 500 MG tablet, Take 1 tablet (500 mg total) by mouth every 6 (six) hours as needed., Disp: 480 tablet, Rfl: 0 .  diclofenac sodium (VOLTAREN) 1 % GEL, Apply 4 g topically 4 (four) times daily., Disp: 100 g, Rfl: 1 .  furosemide (LASIX) 40 MG tablet, TAKE 1 TABLET 2 TIMES DAILYAS NEEDED, Disp: 90 tablet, Rfl: 1 .  liraglutide (VICTOZA) 18 MG/3ML SOPN, Inject 0.1-0.3 mLs (0.6-1.8 mg total) into the skin daily., Disp: 18 mL, Rfl: 1  Allergies  Allergen Reactions  . Codeine      ROS  Constitutional: Negative for fever , positive for  weight change.  Respiratory: Negative for cough and shortness of breath.   Cardiovascular: Negative for chest pain or palpitations.  Gastrointestinal: Negative for abdominal pain, no bowel changes.  Musculoskeletal: Positive  for gait problem or joint swelling.  Skin: Negative for rash.  Neurological: Negative for dizziness or headache.  No other specific complaints in a complete review of systems (except as listed in HPI above).  Objective  Vitals:   08/09/17 0902  BP: 106/62  Pulse: 83  Resp: 16  Temp: (!) 97.5 F (36.4 C)  TempSrc: Oral  SpO2: 96%  Weight: 253 lb  6.4 oz (114.9 kg)  Height: 5' 2"  (1.575 m)    Body mass index is 46.35 kg/m.  Physical Exam  Constitutional: Patient appears well-developed and well-nourished. Obese No distress.  HEENT: head atraumatic, normocephalic, pupils equal and reactive to light,  neck supple, throat within normal limits Cardiovascular: Normal rate, regular rhythm and normal heart sounds.  No murmur heard.  Trace BLE edema. Pulmonary/Chest: Effort normal and breath sounds normal. No respiratory distress. Abdominal: Soft.  There is no tenderness. Muscular Skeletal: crepitus with extension of both knees Psychiatric: Patient has a normal mood and affect. behavior is normal. Judgment and thought content normal.  PHQ2/9: Depression screen [facility] 2/9 07/01/2017 06/14/2017 08/07/2016 06/29/2016 04/07/2016  Decreased Interest 0 0 0 0 0  Down, Depressed, Hopeless 0 0 0 0 0  PHQ - 2 Score 0 0 0 0 0  Altered sleeping - - - - -  Tired, decreased energy - - - - -  Change in appetite - - - - -  Feeling bad or failure about yourself  - - - - -  Trouble concentrating - - - - -  Moving slowly or fidgety/restless - - - - -  Suicidal thoughts - - - - -  PHQ-9 Score - - - - -  Difficult doing work/chores - - - - -     Fall Risk: Fall Risk  07/01/2017 06/14/2017 08/07/2016 06/29/2016 04/07/2016  Falls in the past year? No No No No No     Assessment & Plan  1. Type 2 diabetes mellitus with microalbuminuria, without long-term current use of insulin (HCC)  - liraglutide (VICTOZA) 18 MG/3ML SOPN; Inject 0.1-0.3 mLs (0.6-1.8 mg total) into the skin daily.  Dispense: 18 mL; Refill: 1 - POCT HgB A1C - POCT UA - Microalbumin  2. Asthma, moderate persistent, well-controlled  Doing well on medication   3. Controlled gout  No recent episodes  4. Bilateral primary osteoarthritis of knee  -  diclofenac sodium (VOLTAREN) 1 % GEL; Apply 4 g topically 4 (four) times daily.  Dispense: 100 g; Refill: 1 - acetaminophen (TYLENOL) 500  MG tablet; Take 1 tablet (500 mg total) by mouth every 6 (six) hours as needed.  Dispense: 480 tablet; Refill: 0  5. Bilateral edema of lower extremity  - furosemide (LASIX) 40 MG tablet; TAKE 1 TABLET 2 TIMES DAILYAS NEEDED  Dispense: 90 tablet; Refill: 1  6. Dyslipidemia  Continue medication   7. Morbid obesity due to excess calories ([place])  Losing weight   8. Benign essential HTN  bp is at goal, no side effects

## 2017-08-16 DIAGNOSIS — G4733 Obstructive sleep apnea (adult) (pediatric): Secondary | ICD-10-CM | POA: Diagnosis not present

## 2017-09-13 ENCOUNTER — Ambulatory Visit: Payer: Medicare HMO | Admitting: Pharmacist

## 2017-09-13 ENCOUNTER — Encounter: Payer: Self-pay | Admitting: Pharmacist

## 2017-09-13 DIAGNOSIS — Z79899 Other long term (current) drug therapy: Secondary | ICD-10-CM

## 2017-09-13 NOTE — Progress Notes (Signed)
Amber Avery is here today to review Medicare Part D/Advantage plans.  She currently has [employer] but is interested in reviewing the available options for 2019. This plan will have a zero monthly premium and a $95 annual medication deductible for 2019.  Medicare.gov was used to review the available Part D/Advantage plans. Patient does not qualify for any subsidy. Her primary care provider is Dr. Ancil Boozer.  Patient's medication list was reviewed. All of her medications are covered under this plan except the Vitamin D, diclofenac gel ($44/mo) and gabapentin ($16/mo).  Patient states that she is not currently using the albuterol or budesonide solution or colchicine.  Patient was interested in maintaining the "extra" benefits she is receiving with her current plan such as preventative and comprehensive dental (no monthly premium) and vision (exam and glasses).  Patient felt that the medical benefits of her current plan outweighed any potential cost savings on the medications with other advantage plans for 2019 and chose to remain with her current plan.  Outpatient Encounter Medications as of 09/13/2017  Medication Sig  . acetaminophen (TYLENOL) 500 MG tablet Take 1 tablet (500 mg total) by mouth every 6 (six) hours as needed.  [name] Kitchen allopurinol (ZYLOPRIM) 300 MG tablet Take 1 tablet (300 mg total) by mouth daily.  [name] Kitchen aspirin 81 MG tablet Take 1 tablet by mouth daily.  [name] Kitchen atorvastatin (LIPITOR) 40 MG tablet Daily for cholesterol  . benazepril (LOTENSIN) 20 MG tablet Take 1 tablet (20 mg total) by mouth 2 (two) times daily.  . Blood Glucose Monitoring Suppl (TRUE METRIX AIR GLUCOSE METER) w/Device KIT 1 each by Does not apply route 1 day or 1 dose.  . diclofenac sodium (VOLTAREN) 1 % GEL Apply 4 g topically 4 (four) times daily.  . furosemide (LASIX) 40 MG tablet TAKE 1 TABLET 2 TIMES DAILYAS NEEDED  . gabapentin (NEURONTIN) 300 MG capsule TAKE 1 CAPSULE THREE TIMES DAILY   . glucose blood test strip Use as instructed  . Lancets (ONETOUCH ULTRASOFT) lancets Use as instructed  . liraglutide (VICTOZA) 18 MG/3ML SOPN Inject 0.1-0.3 mLs (0.6-1.8 mg total) into the skin daily.  . metFORMIN (GLUCOPHAGE) 500 MG tablet TAKE 1 TAB IN MORNING AND 2 TABS IN EVENING  . potassium chloride SA (K-DUR,KLOR-CON) 20 MEQ tablet Take 1-2 tablets (20-40 mEq total) by mouth daily.  [name] Kitchen triamcinolone cream (KENALOG) 0.1 % Apply 1 application topically as directed.  . triamcinolone ointment (KENALOG) 0.1 % APPLY A PEA SIZE AMOUNT EXTERNALLY TO AREA EVERY OTHER DAY  . Vitamin D, Cholecalciferol, 1000 UNITS TABS Take 1 capsule by mouth daily.  [name] Kitchen albuterol (ACCUNEB) 1.25 MG/3ML nebulizer solution Take 3 mLs (1.25 mg total) by nebulization 2 (two) times daily. (Patient not taking: Reported on 09/13/2017)  . budesonide (PULMICORT) 0.25 MG/2ML nebulizer solution Take 2 mLs (0.25 mg total) by nebulization 2 (two) times daily. (Patient not taking: Reported on 09/13/2017)  . colchicine 0.6 MG tablet Take 1 tablet by mouth 2 (two) times daily. Takes as needed for flares   No facility-administered encounter medications on file as of 09/13/2017.    PLAN: Patient was made aware that the Medicare annual enrollment period ends December 7th.  Tyshell Ramberg K. Dicky Doe, PharmD [facility] [place] Operations Coordinator [phone]

## 2017-09-16 DIAGNOSIS — G4733 Obstructive sleep apnea (adult) (pediatric): Secondary | ICD-10-CM | POA: Diagnosis not present

## 2017-09-18 DIAGNOSIS — M47816 Spondylosis without myelopathy or radiculopathy, lumbar region: Secondary | ICD-10-CM | POA: Diagnosis not present

## 2017-09-18 DIAGNOSIS — E785 Hyperlipidemia, unspecified: Secondary | ICD-10-CM | POA: Diagnosis not present

## 2017-09-18 DIAGNOSIS — E119 Type 2 diabetes mellitus without complications: Secondary | ICD-10-CM | POA: Diagnosis not present

## 2017-09-18 DIAGNOSIS — M1A9XX0 Chronic gout, unspecified, without tophus (tophi): Secondary | ICD-10-CM | POA: Diagnosis not present

## 2017-09-18 DIAGNOSIS — G4733 Obstructive sleep apnea (adult) (pediatric): Secondary | ICD-10-CM | POA: Diagnosis not present

## 2017-09-18 DIAGNOSIS — M130 Polyarthritis, unspecified: Secondary | ICD-10-CM | POA: Diagnosis not present

## 2017-09-18 DIAGNOSIS — Z0000 Encounter for general adult medical examination without abnormal findings: Secondary | ICD-10-CM | POA: Diagnosis not present

## 2017-09-18 DIAGNOSIS — J45909 Unspecified asthma, uncomplicated: Secondary | ICD-10-CM | POA: Diagnosis not present

## 2017-09-18 DIAGNOSIS — I10 Essential (primary) hypertension: Secondary | ICD-10-CM | POA: Diagnosis not present

## 2017-09-23 ENCOUNTER — Other Ambulatory Visit: Payer: Self-pay | Admitting: Family Medicine

## 2017-09-23 DIAGNOSIS — R69 Illness, unspecified: Secondary | ICD-10-CM | POA: Diagnosis not present

## 2017-09-23 DIAGNOSIS — M170 Bilateral primary osteoarthritis of knee: Secondary | ICD-10-CM

## 2017-09-24 NOTE — Telephone Encounter (Signed)
Refill request for general medication. Diclofenac sodium   Last office visit:08/09/2017  Follow up: 12/10/2017

## 2017-10-16 DIAGNOSIS — G4733 Obstructive sleep apnea (adult) (pediatric): Secondary | ICD-10-CM | POA: Diagnosis not present

## 2017-11-16 DIAGNOSIS — G4733 Obstructive sleep apnea (adult) (pediatric): Secondary | ICD-10-CM | POA: Diagnosis not present

## 2017-11-19 ENCOUNTER — Other Ambulatory Visit: Payer: Self-pay

## 2017-11-19 MED ORDER — INSULIN PEN NEEDLE 32G X 6 MM MISC: 1.0000 | Freq: Four times a day (QID) | 2 refills | Status: DC

## 2017-11-19 NOTE — Telephone Encounter (Signed)
Request for diabetes medication. Novofine needles to [employer].   Last office visit pertaining to diabetes: 08/09/2017  Lab Results  Component Value Date   HGBA1C 6.3 08/09/2017    Follow up on 12/10/2017

## 2017-12-09 DIAGNOSIS — G4733 Obstructive sleep apnea (adult) (pediatric): Secondary | ICD-10-CM | POA: Diagnosis not present

## 2017-12-10 ENCOUNTER — Ambulatory Visit (INDEPENDENT_AMBULATORY_CARE_PROVIDER_SITE_OTHER): Payer: Medicare HMO | Admitting: Family Medicine

## 2017-12-10 ENCOUNTER — Encounter: Payer: Self-pay | Admitting: Family Medicine

## 2017-12-10 VITALS — BP 110/56 | HR 96 | Resp 14 | Ht 62.0 in | Wt 274.2 lb

## 2017-12-10 DIAGNOSIS — G4733 Obstructive sleep apnea (adult) (pediatric): Secondary | ICD-10-CM | POA: Diagnosis not present

## 2017-12-10 DIAGNOSIS — I10 Essential (primary) hypertension: Secondary | ICD-10-CM | POA: Diagnosis not present

## 2017-12-10 DIAGNOSIS — R809 Proteinuria, unspecified: Secondary | ICD-10-CM

## 2017-12-10 DIAGNOSIS — M5416 Radiculopathy, lumbar region: Secondary | ICD-10-CM | POA: Diagnosis not present

## 2017-12-10 DIAGNOSIS — R600 Localized edema: Secondary | ICD-10-CM | POA: Diagnosis not present

## 2017-12-10 DIAGNOSIS — E1129 Type 2 diabetes mellitus with other diabetic kidney complication: Secondary | ICD-10-CM

## 2017-12-10 DIAGNOSIS — N182 Chronic kidney disease, stage 2 (mild): Secondary | ICD-10-CM | POA: Diagnosis not present

## 2017-12-10 DIAGNOSIS — M170 Bilateral primary osteoarthritis of knee: Secondary | ICD-10-CM

## 2017-12-10 DIAGNOSIS — D509 Iron deficiency anemia, unspecified: Secondary | ICD-10-CM

## 2017-12-10 DIAGNOSIS — E785 Hyperlipidemia, unspecified: Secondary | ICD-10-CM | POA: Diagnosis not present

## 2017-12-10 DIAGNOSIS — E1122 Type 2 diabetes mellitus with diabetic chronic kidney disease: Secondary | ICD-10-CM

## 2017-12-10 DIAGNOSIS — J4540 Moderate persistent asthma, uncomplicated: Secondary | ICD-10-CM

## 2017-12-10 DIAGNOSIS — Z23 Encounter for immunization: Secondary | ICD-10-CM

## 2017-12-10 DIAGNOSIS — M109 Gout, unspecified: Secondary | ICD-10-CM

## 2017-12-10 LAB — COMPREHENSIVE METABOLIC PANEL WITH GFR
AG Ratio: 1.5 (calc) (ref 1.0–2.5)
ALT: 44 U/L — ABNORMAL HIGH (ref 6–29)
AST: 37 U/L — ABNORMAL HIGH (ref 10–35)
Albumin: 4 g/dL (ref 3.6–5.1)
Alkaline phosphatase (APISO): 113 U/L (ref 33–130)
BUN/Creatinine Ratio: 34 (calc) — ABNORMAL HIGH (ref 6–22)
BUN: 41 mg/dL — ABNORMAL HIGH (ref 7–25)
CO2: 24 mmol/L (ref 20–32)
Calcium: 9.7 mg/dL (ref 8.6–10.4)
Chloride: 103 mmol/L (ref 98–110)
Creat: 1.2 mg/dL — ABNORMAL HIGH (ref 0.60–0.93)
Globulin: 2.6 g/dL (ref 1.9–3.7)
Glucose, Bld: 94 mg/dL (ref 65–99)
Potassium: 5.2 mmol/L (ref 3.5–5.3)
Sodium: 138 mmol/L (ref 135–146)
Total Bilirubin: 0.5 mg/dL (ref 0.2–1.2)
Total Protein: 6.6 g/dL (ref 6.1–8.1)

## 2017-12-10 LAB — CBC WITH DIFFERENTIAL/PLATELET
Basophils Absolute: 93 {cells}/uL (ref 0–200)
Basophils Relative: 1 %
Eosinophils Absolute: 716 {cells}/uL — ABNORMAL HIGH (ref 15–500)
Eosinophils Relative: 7.7 %
HCT: 34.3 % — ABNORMAL LOW (ref 35.0–45.0)
Hemoglobin: 11.3 g/dL — ABNORMAL LOW (ref 11.7–15.5)
Lymphs Abs: 1739 {cells}/uL (ref 850–3900)
MCH: 29.9 pg (ref 27.0–33.0)
MCHC: 32.9 g/dL (ref 32.0–36.0)
MCV: 90.7 fL (ref 80.0–100.0)
MPV: 11.1 fL (ref 7.5–12.5)
Monocytes Relative: 8 %
Neutro Abs: 6008 {cells}/uL (ref 1500–7800)
Neutrophils Relative %: 64.6 %
Platelets: 261 10*3/uL (ref 140–400)
RBC: 3.78 Million/uL — ABNORMAL LOW (ref 3.80–5.10)
RDW: 14.4 % (ref 11.0–15.0)
Total Lymphocyte: 18.7 %
WBC mixed population: 744 {cells}/uL (ref 200–950)
WBC: 9.3 10*3/uL (ref 3.8–10.8)

## 2017-12-10 LAB — LIPID PANEL
Cholesterol: 148 mg/dL
HDL: 52 mg/dL
LDL Cholesterol (Calc): 70 mg/dL
Non-HDL Cholesterol (Calc): 96 mg/dL
Total CHOL/HDL Ratio: 2.8 (calc)
Triglycerides: 184 mg/dL — ABNORMAL HIGH

## 2017-12-10 LAB — POCT GLYCOSYLATED HEMOGLOBIN (HGB A1C): Hemoglobin A1C: 6.2

## 2017-12-10 MED ORDER — GABAPENTIN 300 MG PO CAPS: ORAL_CAPSULE | ORAL | 3 refills | Status: DC

## 2017-12-10 MED ORDER — ATORVASTATIN CALCIUM 40 MG PO TABS: ORAL_TABLET | ORAL | 3 refills | Status: DC

## 2017-12-10 MED ORDER — ALLOPURINOL 300 MG PO TABS: 300.0000 mg | ORAL_TABLET | Freq: Every day | ORAL | 4 refills | Status: DC

## 2017-12-10 MED ORDER — METFORMIN HCL 500 MG PO TABS: ORAL_TABLET | ORAL | 1 refills | Status: DC

## 2017-12-10 MED ORDER — LIRAGLUTIDE 18 MG/3ML ~~LOC~~ SOPN: 0.6000 mg | PEN_INJECTOR | Freq: Every day | SUBCUTANEOUS | 1 refills | Status: DC

## 2017-12-10 MED ORDER — ZOSTER VAC RECOMB ADJUVANTED 50 MCG/0.5ML IM SUSR: 0.5000 mL | Freq: Once | INTRAMUSCULAR | 1 refills | Status: AC

## 2017-12-10 MED ORDER — FUROSEMIDE 40 MG PO TABS: ORAL_TABLET | ORAL | 1 refills | Status: DC

## 2017-12-10 NOTE — Progress Notes (Signed)
Name: Amber Avery   MRN: [id number]    DOB: Sep 15, 1946   Date:12/10/2017       Progress Note  Subjective  Chief Complaint  Chief Complaint  Patient presents with  . Diabetes  . Hypertension  . Asthma    HPI  Dyslipidemia: taking statin therapy, no side effects, no chest pain. Last labs was at goal back in 03/2017, she is fasting and we will recheck labs today   Asthma: off inhalers, doing better, no wheezing, cough at this time, she has decrease in exercise tolerance but unchanged  DM with renal manifestation: HgbA1C was down to 6.9%,  7.2,, 7.0%%, 6.3% today 6.1% She is on metformin, and  Victoza since 11/2016, but only on 0.6, advised to increase to 1.2. Tolerating medication well, She denies polyphagia, polydipsia or polyuria. Eye exam is up to dateOn Ace for CKI used to have proteinuria.She was doing well but gained weight since last visit  Obesity: she was doing well she has lost  12 lbs from  Feb until October 2018, however sister has a recurrence of Multiple Myeloma and she has been stress eating again, she gained 21 lbs in the past 4 months.   OSA: she wear machine every night, she has a new mask and uses oxygen with CPAP.She is doing well on new mask. Wakes up feeling rested  Right thyroid nodule; [facility] by Endo at [facility], biopsy done and negative   Varicose Veins: present for years and now has pain on left groin and upper thigh, bulging varicose. Seen by Vascular Surgeon but procedure will not be covered and she can't afford it Edema is a little worse today, but she has gained a lot of weight since last visit.   Gait instability: she uses a cane, they moved mailbox near her house and is doing well. Still has pain on both knees from OA. She will have a ramp placed and also will have a walk in shower placed soon  OA : she is having more pain on right knee and would like to see Ortho , not ready for surgery but would like to consider injections, Ortho recommended  Meloxicam, but we will try Tylenol instead to avoid kidney problems.  She also has left hip pain.   Low back pain: she states back pain is better since epidural injection, radiculitis resolved, on Gabapentin    Patient Active Problem List   Diagnosis Date Noted  . Anemia, unspecified 04/12/2017  . Lichen sclerosus of female genitalia 06/29/2016  . Primary osteoarthritis of both knees 04/07/2016  . Special screening for malignant neoplasms, colon   . Right thyroid nodule 06/26/2015  . Asthma, intermittent 04/03/2015  . Benign essential HTN 04/03/2015  . Carpal tunnel syndrome 04/03/2015  . Chronic kidney disease (CKD), stage II (mild) 04/03/2015  . Controlled gout 04/03/2015  . Arteriosclerosis of coronary artery 04/03/2015  . Diabetes mellitus with renal manifestations, controlled ([place]) 04/03/2015  . Dyslipidemia 04/03/2015  . Edema extremities 04/03/2015  . Elevated sedimentation rate 04/03/2015  . Knee pain 04/03/2015  . Lumbar radiculitis 04/03/2015  . Obstructive apnea 04/03/2015  . Lumbosacral spondylosis 04/03/2015  . Osteoarthritis, chronic 04/03/2015  . Psoriasis 04/03/2015  . Vitamin D deficiency 04/03/2015  . Varicose veins 04/03/2015  . Goiter 12/23/2014  . Degeneration of intervertebral disc of lumbar region 08/23/2014  . Corns and callosity 06/11/2009  . Extreme obesity 04/11/2007    Past Surgical History:  Procedure Laterality Date  . APPENDECTOMY    .  CHOLECYSTECTOMY  1977  . COLONOSCOPY WITH PROPOFOL N/A 01/21/2016   Procedure: COLONOSCOPY WITH PROPOFOL;  Surgeon: Lucilla Lame, MD;  Location: [facility] ENDOSCOPY;  Service: Endoscopy;  Laterality: N/A;  . DILATION AND CURETTAGE OF UTERUS     Due to Amenorrhea  . HEMORRHOID BANDING  1980  . HEMORROIDECTOMY    . HERNIA REPAIR  2011  . Temporal Area Excision Biopsy     For Birth Mark Changes, Negative Pathology  . TONSILLECTOMY AND ADENOIDECTOMY      Family History  Problem Relation Age of Onset  . Cancer  Mother        brain tumor  . Kidney disease Mother   . Hypertension Mother   . Heart disease Father   . Hypertension Sister   . Arthritis Sister   . Cancer Sister   . Hernia Sister   . GER disease Sister   . Lupus Sister   . Diabetes Sister   . Breast cancer Maternal Aunt 70    Social History   Socioeconomic History  . Marital status: Divorced    Spouse name: Not on file  . Number of children: Not on file  . Years of education: Not on file  . Highest education level: Not on file  Social Needs  . Financial resource strain: Not on file  . Food insecurity - worry: Not on file  . Food insecurity - inability: Not on file  . Transportation needs - medical: Not on file  . Transportation needs - non-medical: Not on file  Occupational History  . Not on file  Tobacco Use  . Smoking status: Former Smoker    Years: 15.00  . Smokeless tobacco: Never Used  Substance and Sexual Activity  . Alcohol use: Yes    Alcohol/week: 0.0 oz    Comment: rare wine  . Drug use: No  . Sexual activity: Not Currently  Other Topics Concern  . Not on file  Social History Narrative  . Not on file     Current Outpatient Medications:  .  acetaminophen (TYLENOL) 500 MG tablet, Take 1 tablet (500 mg total) by mouth every 6 (six) hours as needed., Disp: 480 tablet, Rfl: 0 .  albuterol (ACCUNEB) 1.25 MG/3ML nebulizer solution, Take 3 mLs (1.25 mg total) by nebulization 2 (two) times daily., Disp: 75 mL, Rfl: 2 .  allopurinol (ZYLOPRIM) 300 MG tablet, Take 1 tablet (300 mg total) by mouth daily., Disp: 90 tablet, Rfl: 4 .  aspirin 81 MG tablet, Take 1 tablet by mouth daily., Disp: , Rfl:  .  atorvastatin (LIPITOR) 40 MG tablet, Daily for cholesterol, Disp: 90 tablet, Rfl: 3 .  benazepril (LOTENSIN) 20 MG tablet, Take 1 tablet (20 mg total) by mouth 2 (two) times daily., Disp: 180 tablet, Rfl: 3 .  Blood Glucose Monitoring Suppl (TRUE METRIX AIR GLUCOSE METER) w/Device KIT, 1 each by Does not apply route  1 day or 1 dose., Disp: 1 kit, Rfl: 0 .  budesonide (PULMICORT) 0.25 MG/2ML nebulizer solution, Take 2 mLs (0.25 mg total) by nebulization 2 (two) times daily., Disp: 60 mL, Rfl: 2 .  colchicine 0.6 MG tablet, Take 1 tablet by mouth 2 (two) times daily. Takes as needed for flares, Disp: , Rfl:  .  diclofenac sodium (VOLTAREN) 1 % GEL, Apply 4 g topically 4 (four) times daily., Disp: 100 g, Rfl: 1 .  furosemide (LASIX) 40 MG tablet, TAKE 1 TABLET 2 TIMES DAILYAS NEEDED, Disp: 90 tablet, Rfl: 1 .  gabapentin (  NEURONTIN) 300 MG capsule, TAKE 1 CAPSULE THREE TIMES DAILY, Disp: 270 capsule, Rfl: 3 .  glucose blood test strip, Use as instructed, Disp: 100 each, Rfl: 12 .  Insulin Pen Needle 32G X 6 MM MISC, 1 each by Does not apply route 4 (four) times daily., Disp: 200 each, Rfl: 2 .  Lancets (ONETOUCH ULTRASOFT) lancets, Use as instructed, Disp: 100 each, Rfl: 12 .  liraglutide (VICTOZA) 18 MG/3ML SOPN, Inject 0.1-0.3 mLs (0.6-1.8 mg total) into the skin daily., Disp: 18 mL, Rfl: 1 .  metFORMIN (GLUCOPHAGE) 500 MG tablet, TAKE 1 TAB IN MORNING AND 2 TABS IN EVENING, Disp: 270 tablet, Rfl: 1 .  potassium chloride SA (K-DUR,KLOR-CON) 20 MEQ tablet, Take 1-2 tablets (20-40 mEq total) by mouth daily., Disp: 180 tablet, Rfl: 3 .  triamcinolone cream (KENALOG) 0.1 %, Apply 1 application topically as directed., Disp: , Rfl:  .  triamcinolone ointment (KENALOG) 0.1 %, APPLY A PEA SIZE AMOUNT EXTERNALLY TO AREA EVERY OTHER DAY, Disp: 90 g, Rfl: 1 .  Vitamin D, Cholecalciferol, 1000 UNITS TABS, Take 1 capsule by mouth daily., Disp: , Rfl:   Allergies  Allergen Reactions  . Codeine      ROS  Constitutional: Negative for fever , positive for  weight change.  Respiratory: Negative for cough and shortness of breath.   Cardiovascular: Negative for chest pain or palpitations.  Gastrointestinal: Negative for abdominal pain, no bowel changes.  Musculoskeletal: Negative for gait problem or joint swelling.   Skin: Negative for rash.  Neurological: Negative for dizziness or headache.  No other specific complaints in a complete review of systems (except as listed in HPI above).  Objective  Vitals:   12/10/17 0900  BP: (!) 110/56  Pulse: 96  Resp: 14  SpO2: 96%  Weight: 274 lb 3.2 oz (124.4 kg)  Height: _0  (1.575 m)    Body mass index is 50.15 kg/m.  Physical Exam  Constitutional: Patient appears well-developed and well-nourished. Obese  No distress.  HEENT: head atraumatic, normocephalic, pupils equal and reactive to light, neck supple, throat within normal limits Cardiovascular: Normal rate, regular rhythm and normal heart sounds.  No murmur heard. Trace  BLE edema. Pulmonary/Chest: Effort normal and breath sounds normal. No respiratory distress. Abdominal: Soft.  There is no tenderness. Psychiatric: Patient has a normal mood and affect. behavior is normal. Judgment and thought content normal.  Recent Results (from the past 2160 hour(s))  POCT HgB A1C     Status: Abnormal   Collection Time: 12/10/17  9:18 AM  Result Value Ref Range   Hemoglobin A1C 6.2      PHQ2/9: Depression screen [facility] 2/9 07/01/2017 06/14/2017 08/07/2016 06/29/2016 04/07/2016  Decreased Interest 0 0 0 0 0  Down, Depressed, Hopeless 0 0 0 0 0  PHQ - 2 Score 0 0 0 0 0  Altered sleeping - - - - -  Tired, decreased energy - - - - -  Change in appetite - - - - -  Feeling bad or failure about yourself  - - - - -  Trouble concentrating - - - - -  Moving slowly or fidgety/restless - - - - -  Suicidal thoughts - - - - -  PHQ-9 Score - - - - -  Difficult doing work/chores - - - - -     Fall Risk: Fall Risk  12/10/2017 07/01/2017 06/14/2017 08/07/2016 06/29/2016  Falls in the past year? _1      Assessment &  Plan   1. Controlled type 2 diabetes mellitus with stage 2 chronic kidney disease, unspecified whether long term insulin use (HCC)  - POCT HgB A1C - metFORMIN (GLUCOPHAGE) 500 MG tablet;  TAKE 1 TAB IN MORNING AND 2 TABS IN EVENING  Dispense: 270 tablet; Refill: 1 - liraglutide (VICTOZA) 18 MG/3ML SOPN; Inject 0.1-0.3 mLs (0.6-1.8 mg total) into the skin daily.  Dispense: 18 mL; Refill: 1  2. Asthma, moderate persistent, well-controlled  stable  3. Bilateral primary osteoarthritis of knee  stable  4. Controlled gout  - allopurinol (ZYLOPRIM) 300 MG tablet; Take 1 tablet (300 mg total) by mouth daily.  Dispense: 90 tablet; Refill: 4  5. Dyslipidemia  - atorvastatin (LIPITOR) 40 MG tablet; Daily for cholesterol  Dispense: 90 tablet; Refill: 3 - Lipid panel  6. Bilateral edema of lower extremity  - furosemide (LASIX) 40 MG tablet; TAKE 1 TABLET 2 TIMES DAILYAS NEEDED  Dispense: 90 tablet; Refill: 1  7. Benign essential HTN  - Comprehensive metabolic panel  8. Morbid obesity due to excess calories [facility])  Discussed with the patient the risk posed by an increased BMI. Discussed importance of portion control, calorie counting and at least 150 minutes of physical activity weekly. Avoid sweet beverages and drink more water. Eat at least 6 servings of fruit and vegetables daily   9. Obstructive apnea  Continue CPAP use  10. Lumbar back pain with radiculopathy affecting left lower extremity  - gabapentin (NEURONTIN) 300 MG capsule; TAKE 1 CAPSULE THREE TIMES DAILY  Dispense: 270 capsule; Refill: 3  11. Type 2 diabetes mellitus with microalbuminuria, without long-term current use of insulin (HCC)  Continue ARB  12. Iron deficiency anemia, unspecified iron deficiency anemia type  Negative hemoccult stools - CBC with Differential/Platelet - Iron, TIBC and Ferritin Panel; Future  13. Need for shingles vaccine  - Zoster Vaccine Adjuvanted [facility]) injection; Inject 0.5 mLs into the muscle once for 1 dose.  Dispense: 0.5 mL; Refill: 1

## 2017-12-10 NOTE — Patient Instructions (Signed)

## 2017-12-10 NOTE — Addendum Note (Signed)
Addended by: Steele Sizer F on: 12/10/2017 10:14 AM   Modules accepted: Orders

## 2017-12-16 DIAGNOSIS — G4733 Obstructive sleep apnea (adult) (pediatric): Secondary | ICD-10-CM | POA: Diagnosis not present

## 2018-01-14 ENCOUNTER — Other Ambulatory Visit: Payer: Self-pay | Admitting: Family Medicine

## 2018-01-14 DIAGNOSIS — E1129 Type 2 diabetes mellitus with other diabetic kidney complication: Secondary | ICD-10-CM

## 2018-01-14 DIAGNOSIS — R809 Proteinuria, unspecified: Principal | ICD-10-CM

## 2018-01-14 DIAGNOSIS — G4733 Obstructive sleep apnea (adult) (pediatric): Secondary | ICD-10-CM | POA: Diagnosis not present

## 2018-01-19 ENCOUNTER — Telehealth: Payer: Self-pay | Admitting: Family Medicine

## 2018-01-19 NOTE — Telephone Encounter (Signed)
Medication was sent on 12/11/17 with a 90 day supply and 3 refills to [facility]. But when I called to check on the status since the patient was telling me they never received it. It was confirmed they never received our prescription through Epic even though we received the confirmation. I called in the medication to [employer] from 12/11/17 and Holland Falling is processing the medication to send out to her. Patient was informed.

## 2018-01-19 NOTE — Telephone Encounter (Signed)
Copied from [place]. Topic: Quick Communication - Rx Refill/Question >> Jan 19, 2018 12:11 PM Selinda Flavin B, NT wrote: Medication: benazepril (LOTENSIN) Has the patient contacted their pharmacy? Yes.   (Agent: If no, request that the patient contact the pharmacy for the refill.) Preferred Pharmacy (with phone number or street name): [place], [place] [phone] (Phone) [phone] (Fax)  Agent: Please be advised that RX refills may take up to 3 business days. We ask that you follow-up with your pharmacy.

## 2018-02-14 DIAGNOSIS — G4733 Obstructive sleep apnea (adult) (pediatric): Secondary | ICD-10-CM | POA: Diagnosis not present

## 2018-02-14 DIAGNOSIS — J449 Chronic obstructive pulmonary disease, unspecified: Secondary | ICD-10-CM | POA: Diagnosis not present

## 2018-02-25 ENCOUNTER — Other Ambulatory Visit: Payer: Self-pay | Admitting: Family Medicine

## 2018-02-25 ENCOUNTER — Other Ambulatory Visit: Payer: Self-pay

## 2018-02-25 DIAGNOSIS — L409 Psoriasis, unspecified: Secondary | ICD-10-CM

## 2018-02-25 DIAGNOSIS — M170 Bilateral primary osteoarthritis of knee: Secondary | ICD-10-CM

## 2018-02-25 MED ORDER — DICLOFENAC SODIUM 1 % TD GEL: 4.0000 g | Freq: Four times a day (QID) | TRANSDERMAL | 1 refills | Status: DC

## 2018-02-25 MED ORDER — TRIAMCINOLONE ACETONIDE 0.1 % EX OINT: TOPICAL_OINTMENT | CUTANEOUS | 1 refills | Status: DC

## 2018-02-25 NOTE — Telephone Encounter (Signed)
Prior Authorization has been approved. Please send new prescription to [facility].   Effective date: 10/17/2017 Expiration date: 10/18/2018

## 2018-03-16 DIAGNOSIS — G4733 Obstructive sleep apnea (adult) (pediatric): Secondary | ICD-10-CM | POA: Diagnosis not present

## 2018-03-16 DIAGNOSIS — J449 Chronic obstructive pulmonary disease, unspecified: Secondary | ICD-10-CM | POA: Diagnosis not present

## 2018-03-30 ENCOUNTER — Telehealth: Payer: Self-pay | Admitting: Family Medicine

## 2018-03-30 NOTE — Telephone Encounter (Signed)
Copied from [place] [phone]. Topic: Inquiry >> Mar 30, 2018  3:52 PM Amber Avery wrote: Reason for CRM: pt called [facility] pt spoke w/ her insurance and they stated that the pt could not get her oxygen machine; pt can get it only if the pcp calls and requests the pt to have the oxygen machine; but it sounds like the oxygen Rx may be needing a prior authorization, contact [place] Choose option 3; call pt to advise if needed

## 2018-03-31 NOTE — Telephone Encounter (Signed)
I contacted [facility] Romans and they informed me that this patient needed a PA in order to continue with her oxygen. I asked what all had to be done and they said that we must submit her last clinical notes, ICD & CPT codes with attn pre-determination.

## 2018-04-04 ENCOUNTER — Telehealth: Payer: Self-pay

## 2018-04-04 NOTE — Telephone Encounter (Signed)
Copied from [place] [phone]. Topic: Inquiry >> Apr 01, 2018  2:51 PM Pricilla Handler wrote: Reason for CRM: Ivy with [facility] [employer] Department called requesting information concerning patient's CPAP. Karlene Einstein states they do not have a call back number in the [place] office.       Thank You!!!

## 2018-04-04 NOTE — Telephone Encounter (Signed)
Copied from [place] [phone]. Topic: Inquiry >> Apr 01, 2018  2:51 PM Pricilla Handler wrote: Reason for CRM: Ivy with [facility] [employer] Department called requesting information concerning patient's CPAP. Karlene Einstein states they do not have a call back number in the [place] office.       Thank You!!!

## 2018-04-04 NOTE — Telephone Encounter (Signed)
Since they have no call back number, there is no way of giving them the requested information.  I will wait until they call back.

## 2018-04-11 ENCOUNTER — Encounter: Payer: Self-pay | Admitting: Family Medicine

## 2018-04-11 ENCOUNTER — Ambulatory Visit (INDEPENDENT_AMBULATORY_CARE_PROVIDER_SITE_OTHER): Payer: Medicare HMO | Admitting: Family Medicine

## 2018-04-11 DIAGNOSIS — E119 Type 2 diabetes mellitus without complications: Secondary | ICD-10-CM | POA: Diagnosis not present

## 2018-04-11 DIAGNOSIS — H524 Presbyopia: Secondary | ICD-10-CM | POA: Diagnosis not present

## 2018-04-11 DIAGNOSIS — E089 Diabetes mellitus due to underlying condition without complications: Secondary | ICD-10-CM | POA: Diagnosis not present

## 2018-04-11 DIAGNOSIS — N183 Chronic kidney disease, stage 3 unspecified: Secondary | ICD-10-CM

## 2018-04-11 DIAGNOSIS — E785 Hyperlipidemia, unspecified: Secondary | ICD-10-CM

## 2018-04-11 DIAGNOSIS — I10 Essential (primary) hypertension: Secondary | ICD-10-CM

## 2018-04-11 DIAGNOSIS — N182 Chronic kidney disease, stage 2 (mild): Secondary | ICD-10-CM | POA: Diagnosis not present

## 2018-04-11 DIAGNOSIS — E1129 Type 2 diabetes mellitus with other diabetic kidney complication: Secondary | ICD-10-CM

## 2018-04-11 DIAGNOSIS — R809 Proteinuria, unspecified: Secondary | ICD-10-CM

## 2018-04-11 DIAGNOSIS — G4733 Obstructive sleep apnea (adult) (pediatric): Secondary | ICD-10-CM | POA: Diagnosis not present

## 2018-04-11 DIAGNOSIS — H2513 Age-related nuclear cataract, bilateral: Secondary | ICD-10-CM | POA: Diagnosis not present

## 2018-04-11 DIAGNOSIS — D509 Iron deficiency anemia, unspecified: Secondary | ICD-10-CM | POA: Diagnosis not present

## 2018-04-11 DIAGNOSIS — E1122 Type 2 diabetes mellitus with diabetic chronic kidney disease: Secondary | ICD-10-CM

## 2018-04-11 DIAGNOSIS — J4530 Mild persistent asthma, uncomplicated: Secondary | ICD-10-CM | POA: Diagnosis not present

## 2018-04-11 DIAGNOSIS — H16142 Punctate keratitis, left eye: Secondary | ICD-10-CM | POA: Diagnosis not present

## 2018-04-11 LAB — POCT GLYCOSYLATED HEMOGLOBIN (HGB A1C): Hemoglobin A1C: 6.5 % — AB (ref 4.0–5.6)

## 2018-04-11 LAB — POCT UA - MICROALBUMIN: Microalbumin Ur, POC: 20 mg/L

## 2018-04-11 MED ORDER — LIRAGLUTIDE 18 MG/3ML ~~LOC~~ SOPN: 1.8000 mg | PEN_INJECTOR | Freq: Every day | SUBCUTANEOUS | 1 refills | Status: DC

## 2018-04-11 MED ORDER — BENAZEPRIL HCL 20 MG PO TABS: 20.0000 mg | ORAL_TABLET | Freq: Every day | ORAL | 0 refills | Status: DC

## 2018-04-11 NOTE — Addendum Note (Signed)
Addended by: Inda Coke on: 04/11/2018 11:41 AM   Modules accepted: Orders

## 2018-04-11 NOTE — Progress Notes (Signed)
Name: Amber Avery   MRN: [id number]    DOB: Mar 27, 1946   Date:04/11/2018       Progress Note  Subjective  Chief Complaint  Chief Complaint  Patient presents with  . Medication Refill  . Diabetes    Checks daily- Average-135 Highest-193 Amber Avery is expensive since she is in the [facility]- needs help.  [name] Kitchen Dyslipidemia  . Asthma    Has been improving since she is able to shower daily due to having a new ramp installed in her house and a walk in shower.  . Sleep Apnea  . Back Pain    Stable   . Knee Pain    HPI  Dyslipidemia: taking statin therapy, no side effects, no chest pain.Last labs was at goal.   Asthma Mild intermittent: she states she is doing much better since she started showering daily, she has a cough daily, but only during her shower, no wheezing or SOB. She is only using albuterol prn and at most once a week.   DM with renal manifestation: HgbA1C was down to 6.9%, 7.2,,7.0%%, 6.3% , 6.1% and today up to 6.5%  She is on metformin, and Victoza since 11/2016, 1.2 mg of Victoza Tolerating medication well, however she is now in the dounut hole and is wondering if she will be able to afford medication. We gave papers to fill out for assistance and we will give her samples today.  She denies polyphagia, polydipsia or polyuria. Eye exam will be done today.On Ace for CKI used to have proteinuria, but normal urine micro today.    Obesity: she was doing well she has lost  12 lbs from  Feb until October 2018 , however sister has a recurrence of Multiple Myeloma and she has been stress eating again, she gained 21 lbs from October 2018 till 11/2017, she has lost 2 lbs since last visit. She is trying to eat healthier.   OSA: she wear machine every night, she has a new mask and uses oxygen with CPAP.She is doing well on new mask. Wakes up feeling rested. Unchanged   Right thyroid nodule; seen by Endo at [facility], biopsy done and negative. Unchanged   Varicose  Veins: present for years and now has pain on left groin and upper thigh, bulging varicose. Seen by Vascular Surgeon but procedure will not be covered . Swelling is better today.   Gait instability: she uses a cane,they moved mailbox near her house and is doing well. Still has pain on both knees from OA. She has a walk in shower and a ramp now and it has helped her to be more independent.   OA : she saw Ortho and he advised her not to have surgery because of her weight. She is still taking Tylenol , pain level is 3/10, but can go up to 10 at the end of the day.   Low back pain: she states back pain improved with  epidural injection, radiculitis is back and radiates left lower leg, worse in afternoon and night time, on Gabapentin. Still using a caner, unchanged     Patient Active Problem List   Diagnosis Date Noted  . Anemia, unspecified 04/12/2017  . Lichen sclerosus of female genitalia 06/29/2016  . Primary osteoarthritis of both knees 04/07/2016  . Special screening for malignant neoplasms, colon   . Right thyroid nodule 06/26/2015  . Asthma, intermittent 04/03/2015  . Benign essential HTN 04/03/2015  . Carpal tunnel syndrome 04/03/2015  . Chronic kidney disease (CKD),  stage II (mild) 04/03/2015  . Controlled gout 04/03/2015  . Arteriosclerosis of coronary artery 04/03/2015  . Diabetes mellitus with renal manifestations, controlled (Amber Avery) 04/03/2015  . Dyslipidemia 04/03/2015  . Edema extremities 04/03/2015  . Elevated sedimentation rate 04/03/2015  . Knee pain 04/03/2015  . Lumbar radiculitis 04/03/2015  . Obstructive apnea 04/03/2015  . Lumbosacral spondylosis 04/03/2015  . Osteoarthritis, chronic 04/03/2015  . Psoriasis 04/03/2015  . Vitamin D deficiency 04/03/2015  . Varicose veins 04/03/2015  . Goiter 12/23/2014  . Degeneration of intervertebral disc of lumbar region 08/23/2014  . Corns and callosity 06/11/2009  . Extreme obesity 04/11/2007    Past Surgical History:   Procedure Laterality Date  . APPENDECTOMY    . CHOLECYSTECTOMY  1977  . COLONOSCOPY WITH PROPOFOL N/A 01/21/2016   Procedure: COLONOSCOPY WITH PROPOFOL;  Surgeon: Lucilla Lame, MD;  Location: [facility] ENDOSCOPY;  Service: Endoscopy;  Laterality: N/A;  . DILATION AND CURETTAGE OF UTERUS     Due to Amenorrhea  . HEMORRHOID BANDING  1980  . HEMORROIDECTOMY    . HERNIA REPAIR  2011  . Temporal Area Excision Biopsy     For Birth Mark Changes, Negative Pathology  . TONSILLECTOMY AND ADENOIDECTOMY      Family History  Problem Relation Age of Onset  . Cancer Mother        brain tumor  . Kidney disease Mother   . Hypertension Mother   . Heart disease Father   . Hypertension Sister   . Arthritis Sister   . Cancer Sister   . Hernia Sister   . GER disease Sister   . Lupus Sister   . Diabetes Sister   . Breast cancer Maternal Aunt 70    Social History   Socioeconomic History  . Marital status: Divorced    Spouse name: Not on file  . Number of children: Not on file  . Years of education: Not on file  . Highest education level: Not on file  Occupational History  . Not on file  Social Needs  . Financial resource strain: Not on file  . Food insecurity:    Worry: Not on file    Inability: Not on file  . Transportation needs:    Medical: Not on file    Non-medical: Not on file  Tobacco Use  . Smoking status: Former Smoker    Years: 15.00  . Smokeless tobacco: Never Used  Substance and Sexual Activity  . Alcohol use: Yes    Alcohol/week: 0.0 oz    Comment: rare wine  . Drug use: No  . Sexual activity: Not Currently  Lifestyle  . Physical activity:    Days per week: Not on file    Minutes per session: Not on file  . Stress: Not on file  Relationships  . Social connections:    Talks on phone: Not on file    Gets together: Not on file    Attends religious service: Not on file    Active member of club or organization: Not on file    Attends meetings of clubs or  organizations: Not on file    Relationship status: Not on file  . Intimate partner violence:    Fear of current or ex partner: Not on file    Emotionally abused: Not on file    Physically abused: Not on file    Forced sexual activity: Not on file  Other Topics Concern  . Not on file  Social History Narrative  . Not  on file     Current Outpatient Medications:  .  acetaminophen (TYLENOL) 500 MG tablet, Take 1 tablet (500 mg total) by mouth every 6 (six) hours as needed. (Patient taking differently: Take 1,000 mg by mouth every 6 (six) hours as needed. ), Disp: 480 tablet, Rfl: 0 .  albuterol (ACCUNEB) 1.25 MG/3ML nebulizer solution, Take 3 mLs (1.25 mg total) by nebulization 2 (two) times daily., Disp: 75 mL, Rfl: 2 .  allopurinol (ZYLOPRIM) 300 MG tablet, Take 1 tablet (300 mg total) by mouth daily., Disp: 90 tablet, Rfl: 4 .  aspirin 81 MG tablet, Take 1 tablet by mouth daily., Disp: , Rfl:  .  atorvastatin (LIPITOR) 40 MG tablet, Daily for cholesterol, Disp: 90 tablet, Rfl: 3 .  benazepril (LOTENSIN) 20 MG tablet, Take 1 tablet (20 mg total) by mouth daily., Disp: 90 tablet, Rfl: 0 .  Blood Glucose Monitoring Suppl (TRUE METRIX AIR GLUCOSE METER) w/Device KIT, 1 each by Does not apply route 1 day or 1 dose., Disp: 1 kit, Rfl: 0 .  budesonide (PULMICORT) 0.25 MG/2ML nebulizer solution, Take 2 mLs (0.25 mg total) by nebulization 2 (two) times daily., Disp: 60 mL, Rfl: 2 .  colchicine 0.6 MG tablet, Take 1 tablet by mouth 2 (two) times daily. Takes as needed for flares, Disp: , Rfl:  .  diclofenac sodium (VOLTAREN) 1 % GEL, Apply 4 g topically 4 (four) times daily., Disp: 100 g, Rfl: 1 .  furosemide (LASIX) 40 MG tablet, TAKE 1 TABLET 2 TIMES DAILYAS NEEDED, Disp: 90 tablet, Rfl: 1 .  gabapentin (NEURONTIN) 300 MG capsule, TAKE 1 CAPSULE THREE TIMES DAILY, Disp: 270 capsule, Rfl: 3 .  glucose blood test strip, Use as instructed, Disp: 100 each, Rfl: 12 .  Insulin Pen Needle 32G X 6 MM MISC,  1 each by Does not apply route 4 (four) times daily., Disp: 200 each, Rfl: 2 .  Lancets (ONETOUCH ULTRASOFT) lancets, Use as instructed, Disp: 100 each, Rfl: 12 .  liraglutide (VICTOZA) 18 MG/3ML SOPN, Inject 0.3 mLs (1.8 mg total) into the skin daily., Disp: 27 mL, Rfl: 1 .  metFORMIN (GLUCOPHAGE) 500 MG tablet, TAKE 1 TAB IN MORNING AND 2 TABS IN EVENING, Disp: 270 tablet, Rfl: 1 .  potassium chloride SA (K-DUR,KLOR-CON) 20 MEQ tablet, Take 1-2 tablets (20-40 mEq total) by mouth daily., Disp: 180 tablet, Rfl: 3 .  triamcinolone cream (KENALOG) 0.1 %, Apply 1 application topically as directed., Disp: , Rfl:  .  triamcinolone ointment (KENALOG) 0.1 %, APPLY A PEA SIZE AMOUNT EXTERNALLY TO AREA EVERY OTHER DAY, Disp: 80 g, Rfl: 1 .  Vitamin D, Cholecalciferol, 1000 UNITS TABS, Take 1 capsule by mouth daily., Disp: , Rfl:   Allergies  Allergen Reactions  . Codeine      ROS  Constitutional: Negative for fever or significant  weight change.  Respiratory: Negative for cough and shortness of breath.   Cardiovascular: Negative for chest pain or palpitations.  Gastrointestinal: Negative for abdominal pain, no bowel changes.  Musculoskeletal: Positive  for gait problem but no joint swelling.  Skin: Positive  for rash. She has psoriasis  Neurological: Negative for dizziness or headache.  No other specific complaints in a complete review of systems (except as listed in HPI above).  Objective  Vitals:   04/11/18 1038  BP: (!) 106/58  Pulse: 85  Resp: 16  Temp: (!) 97.1 F (36.2 C)  TempSrc: Oral  SpO2: 95%  Weight: 272 lb 6.4 oz (123.6 kg)  Height: _0  (1.575 m)    Body mass index is 49.82 kg/m.  Physical Exam  Constitutional: Patient appears well-developed and well-nourished. Obese  No distress.  HEENT: head atraumatic, normocephalic, pupils equal and reactive to light,  neck supple, throat within normal limits Cardiovascular: Normal rate, regular rhythm and normal heart  sounds.  No murmur heard. Trace BLE edema. Pulmonary/Chest: Effort normal and breath sounds normal. No respiratory distress. Abdominal: Soft.  There is no tenderness. Psychiatric: Patient has a normal mood and affect. behavior is normal. Judgment and thought content normal.  Recent Results (from the past 2160 hour(s))  POCT HgB A1C     Status: Abnormal   Collection Time: 04/11/18 10:53 AM  Result Value Ref Range   Hemoglobin A1C 6.5 (A) 4.0 - 5.6 %   HbA1c POC (<> result, manual entry)  4.0 - 5.6 %   HbA1c, POC (prediabetic range)  5.7 - 6.4 %   HbA1c, POC (controlled diabetic range)  0.0 - 7.0 %  POCT UA - Microalbumin     Status: None   Collection Time: 04/11/18 10:53 AM  Result Value Ref Range   Microalbumin Ur, POC 20 mg/L   Creatinine, POC  mg/dL   Albumin/Creatinine Ratio, Urine, POC      Diabetic Foot Exam: Diabetic Foot Exam - Simple   Simple Foot Form Visual Inspection See comments:  Yes Sensation Testing Intact to touch and monofilament testing bilaterally:  Yes Pulse Check Posterior Tibialis and Dorsalis pulse intact bilaterally:  Yes Comments Thick toe nails.      PHQ2/9: Depression screen [facility] 2/9 04/11/2018 07/01/2017 06/14/2017 08/07/2016 06/29/2016  Decreased Interest 0 0 0 0 0  Down, Depressed, Hopeless 0 0 0 0 0  PHQ - 2 Score 0 0 0 0 0  Altered sleeping 1 - - - -  Tired, decreased energy 1 - - - -  Change in appetite 0 - - - -  Feeling bad or failure about yourself  0 - - - -  Trouble concentrating 1 - - - -  Moving slowly or fidgety/restless 0 - - - -  Suicidal thoughts 0 - - - -  PHQ-9 Score 3 - - - -  Difficult doing work/chores Not difficult at all - - - -    Fall Risk: Fall Risk  04/11/2018 12/10/2017 07/01/2017 06/14/2017 08/07/2016  Falls in the past year? _1     Functional Status Survey: Is the patient deaf or have difficulty hearing?: No Does the patient have difficulty seeing, even when wearing glasses/contacts?: Yes Does the  patient have difficulty concentrating, remembering, or making decisions?: No Does the patient have difficulty walking or climbing stairs?: No Does the patient have difficulty dressing or bathing?: No Does the patient have difficulty doing errands alone such as visiting a doctor's office or shopping?: No   Assessment & Plan  1. Controlled type 2 diabetes mellitus with stage 2 chronic kidney disease, unspecified whether long term insulin use (HCC)  - COMPLETE METABOLIC PANEL WITH GFR  2. Asthma, mild persistent, well-controlled  Doing well at this time  3. Benign essential HTN  BP is towards low end of normal, we will stop BID dose, and go down to one daily   4. Morbid obesity due to excess calories [facility])  Discussed with the patient the risk posed by an increased BMI. Discussed importance of portion control, calorie counting and at least 150 minutes of physical activity weekly. Avoid sweet beverages and drink  more water. Eat at least 6 servings of fruit and vegetables daily   5. Dyslipidemia  On statin therapy   6. Type 2 diabetes mellitus with microalbuminuria, without long-term current use of insulin (HCC)  - POCT HgB A1C - POCT UA - Microalbumin  7. Iron deficiency anemia, unspecified iron deficiency anemia type  - CBC with Differential/Platelet - Iron, TIBC and Ferritin Panel; Future  8. Obstructive apnea  Continue supplementation

## 2018-04-12 LAB — COMPLETE METABOLIC PANEL WITHOUT GFR
AG Ratio: 1.5 (calc) (ref 1.0–2.5)
ALT: 33 U/L — ABNORMAL HIGH (ref 6–29)
AST: 39 U/L — ABNORMAL HIGH (ref 10–35)
Albumin: 4.1 g/dL (ref 3.6–5.1)
Alkaline phosphatase (APISO): 105 U/L (ref 33–130)
BUN/Creatinine Ratio: 36 (calc) — ABNORMAL HIGH (ref 6–22)
BUN: 42 mg/dL — ABNORMAL HIGH (ref 7–25)
CO2: 26 mmol/L (ref 20–32)
Calcium: 9.6 mg/dL (ref 8.6–10.4)
Chloride: 105 mmol/L (ref 98–110)
Creat: 1.17 mg/dL — ABNORMAL HIGH (ref 0.60–0.93)
GFR, Est African American: 55 mL/min/{1.73_m2} — ABNORMAL LOW
GFR, Est Non African American: 47 mL/min/{1.73_m2} — ABNORMAL LOW
Globulin: 2.8 g/dL (ref 1.9–3.7)
Glucose, Bld: 105 mg/dL — ABNORMAL HIGH (ref 65–99)
Potassium: 4.9 mmol/L (ref 3.5–5.3)
Sodium: 141 mmol/L (ref 135–146)
Total Bilirubin: 0.5 mg/dL (ref 0.2–1.2)
Total Protein: 6.9 g/dL (ref 6.1–8.1)

## 2018-04-12 LAB — CBC WITH DIFFERENTIAL/PLATELET
Basophils Absolute: 50 {cells}/uL (ref 0–200)
Basophils Relative: 0.7 %
Eosinophils Absolute: 288 {cells}/uL (ref 15–500)
Eosinophils Relative: 4 %
HCT: 31.7 % — ABNORMAL LOW (ref 35.0–45.0)
Hemoglobin: 10.4 g/dL — ABNORMAL LOW (ref 11.7–15.5)
Lymphs Abs: 1613 {cells}/uL (ref 850–3900)
MCH: 28.8 pg (ref 27.0–33.0)
MCHC: 32.8 g/dL (ref 32.0–36.0)
MCV: 87.8 fL (ref 80.0–100.0)
MPV: 11.2 fL (ref 7.5–12.5)
Monocytes Relative: 10.7 %
Neutro Abs: 4478 {cells}/uL (ref 1500–7800)
Neutrophils Relative %: 62.2 %
Platelets: 229 10*3/uL (ref 140–400)
RBC: 3.61 Million/uL — ABNORMAL LOW (ref 3.80–5.10)
RDW: 15.2 % — ABNORMAL HIGH (ref 11.0–15.0)
Total Lymphocyte: 22.4 %
WBC mixed population: 770 {cells}/uL (ref 200–950)
WBC: 7.2 10*3/uL (ref 3.8–10.8)

## 2018-04-12 LAB — IRON,TIBC AND FERRITIN PANEL
%SAT: 9 % — ABNORMAL LOW (ref 16–45)
Ferritin: 16 ng/mL (ref 16–288)
Iron: 41 ug/dL — ABNORMAL LOW (ref 45–160)
TIBC: 471 ug/dL — ABNORMAL HIGH (ref 250–450)

## 2018-04-12 LAB — HM DIABETES EYE EXAM

## 2018-04-13 ENCOUNTER — Telehealth: Payer: Self-pay | Admitting: Family Medicine

## 2018-04-13 ENCOUNTER — Other Ambulatory Visit: Payer: Self-pay | Admitting: Family Medicine

## 2018-04-13 ENCOUNTER — Encounter: Payer: Self-pay | Admitting: Family Medicine

## 2018-04-13 DIAGNOSIS — M5416 Radiculopathy, lumbar region: Secondary | ICD-10-CM

## 2018-04-13 DIAGNOSIS — D509 Iron deficiency anemia, unspecified: Secondary | ICD-10-CM

## 2018-04-13 NOTE — Telephone Encounter (Signed)
Patient stated that she is ready for further evaluation with a hematologist per Dr. Ancil Boozer recommendations.  Patient stated that she is available anytime after 10am during the week of July 1st, week of July 21st, and the week of July 28th.

## 2018-04-13 NOTE — Telephone Encounter (Signed)
Sent referral. Can you please attach when she is available?

## 2018-04-15 ENCOUNTER — Other Ambulatory Visit: Payer: Self-pay

## 2018-04-15 DIAGNOSIS — M5416 Radiculopathy, lumbar region: Secondary | ICD-10-CM

## 2018-04-16 DIAGNOSIS — G4733 Obstructive sleep apnea (adult) (pediatric): Secondary | ICD-10-CM | POA: Diagnosis not present

## 2018-04-17 MED ORDER — GABAPENTIN 300 MG PO CAPS: ORAL_CAPSULE | ORAL | 3 refills | Status: DC

## 2018-04-20 ENCOUNTER — Encounter: Payer: Self-pay | Admitting: Oncology

## 2018-04-20 ENCOUNTER — Other Ambulatory Visit: Payer: Self-pay

## 2018-04-20 ENCOUNTER — Inpatient Hospital Stay: Payer: Medicare HMO | Attending: Oncology | Admitting: Oncology

## 2018-04-20 DIAGNOSIS — Z87891 Personal history of nicotine dependence: Secondary | ICD-10-CM | POA: Insufficient documentation

## 2018-04-20 DIAGNOSIS — E119 Type 2 diabetes mellitus without complications: Secondary | ICD-10-CM | POA: Insufficient documentation

## 2018-04-20 DIAGNOSIS — D649 Anemia, unspecified: Secondary | ICD-10-CM

## 2018-04-20 DIAGNOSIS — D509 Iron deficiency anemia, unspecified: Secondary | ICD-10-CM | POA: Diagnosis present

## 2018-04-20 DIAGNOSIS — I10 Essential (primary) hypertension: Secondary | ICD-10-CM

## 2018-04-20 HISTORY — DX: Iron deficiency anemia, unspecified: D50.9

## 2018-04-20 NOTE — Progress Notes (Addendum)
Hematology/Oncology Consult note [facility] Telephone:([phone] [phone]   Patient Care Team: Steele Sizer, MD as PCP - General (Family Medicine) Bryson Ha, OD as Consulting Physician (Optometry) Sharlet Salina, MD as Referring Physician (Physical Medicine and Rehabilitation)  REFERRING PROVIDER: Steele Sizer, MD CHIEF COMPLAINTS/REASON FOR VISIT:  Evaluation of anemia  HISTORY OF PRESENTING ILLNESS:  Amber Avery is a  72 y.o.  female with PMH listed below who was referred to me for evaluation of anemia. Patient recently had lab work done ordered by primary care physician.  Labs were reviewed by me.  Revealed anemia with hemoglobin of 10.4, MCV 87.8, platelet counts 229, normal WBC count 7.2, normal differential. Iron decreased panel showed saturation at 9, TIBC 471, ferritin 16, iron 41. Creatinine 1.17, estimated GFR 47. Reviewed patient's previous labs ordered by primary care physician's office, anemia duration is chronic, dated back to 2018, ranges between 10-11.No aggravating or improving factors.  Associated symptoms: Associated with fatigue and SOB with exertion.  Context: Denies weight loss, easy bruising, hematochezia, hemoptysis, hematuria.   Context: History of GI bleeding: Denies               History of CKD yes:, Estimated GFR 47.               History of autoimmune disease denies               History of hemolytic anemia.  Denies           Last colonoscopy was done in 2017    which showed diverticulitis, nonbleeding hemorrhoids.  In addition patient also reports history of peptic ulcer disease stomach discomfort.  Denies seeing any melena.   Review of Systems  Constitutional: Positive for malaise/fatigue. Negative for chills, fever and weight loss.  HENT: Negative for congestion, ear discharge, ear pain, nosebleeds, sinus pain and sore throat.   Eyes: Negative for double vision, photophobia, pain, discharge and  redness.  Respiratory: Negative for cough, hemoptysis, sputum production, shortness of breath and wheezing.   Cardiovascular: Negative for chest pain, palpitations, orthopnea, claudication and leg swelling.  Gastrointestinal: Negative for abdominal pain, blood in stool, constipation, diarrhea, heartburn, melena, nausea and vomiting.       Stomach discomfort  Genitourinary: Negative for dysuria, flank pain, frequency and hematuria.  Musculoskeletal: Negative for back pain, myalgias and neck pain.  Skin: Negative for itching and rash.  Neurological: Negative for dizziness, tingling, focal weakness, weakness and headaches.  Endo/Heme/Allergies: Negative for environmental allergies. Does not bruise/bleed easily.  Psychiatric/Behavioral: Negative for depression and hallucinations. The patient is not nervous/anxious.     MEDICAL HISTORY:  Past Medical History:  Diagnosis Date  . Asthma   . Diabetes mellitus without complication ([place])   . Gout   . Hyperlipidemia   . Hypertension   . Iron deficiency anemia 04/20/2018  . PONV (postoperative nausea and vomiting)   . Shortness of breath dyspnea   . Sleep apnea     SURGICAL HISTORY: Past Surgical History:  Procedure Laterality Date  . APPENDECTOMY    . CHOLECYSTECTOMY  1977  . COLONOSCOPY WITH PROPOFOL N/A 01/21/2016   Procedure: COLONOSCOPY WITH PROPOFOL;  Surgeon: Lucilla Lame, MD;  Location: [facility] ENDOSCOPY;  Service: Endoscopy;  Laterality: N/A;  . DILATION AND CURETTAGE OF UTERUS     Due to Amenorrhea  . HEMORRHOID BANDING  1980  . HEMORROIDECTOMY    . HERNIA REPAIR  2011  . Temporal Area Excision Biopsy  For [employer] Changes, Negative Pathology  . TONSILLECTOMY AND ADENOIDECTOMY      SOCIAL HISTORY: Social History   Socioeconomic History  . Marital status: Divorced    Spouse name: Not on file  . Number of children: Not on file  . Years of education: Not on file  . Highest education level: Not on file  Occupational  History  . Not on file  Social Needs  . Financial resource strain: Not on file  . Food insecurity:    Worry: Not on file    Inability: Not on file  . Transportation needs:    Medical: Not on file    Non-medical: Not on file  Tobacco Use  . Smoking status: Former Smoker    Years: 15.00  . Smokeless tobacco: Never Used  . Tobacco comment: Quit at age 25  Substance and Sexual Activity  . Alcohol use: Yes    Alcohol/week: 0.0 oz    Comment: rare wine  . Drug use: No  . Sexual activity: Not Currently  Lifestyle  . Physical activity:    Days per week: Not on file    Minutes per session: Not on file  . Stress: Not on file  Relationships  . Social connections:    Talks on phone: Not on file    Gets together: Not on file    Attends religious service: Not on file    Active member of club or organization: Not on file    Attends meetings of clubs or organizations: Not on file    Relationship status: Not on file  . Intimate partner violence:    Fear of current or ex partner: Not on file    Emotionally abused: Not on file    Physically abused: Not on file    Forced sexual activity: Not on file  Other Topics Concern  . Not on file  Social History Narrative  . Not on file    FAMILY HISTORY: Family History  Problem Relation Age of Onset  . Cancer Mother        brain tumor  . Kidney disease Mother   . Hypertension Mother   . Heart disease Father   . Hypertension Sister   . Arthritis Sister   . Cancer Sister   . Hernia Sister   . GER disease Sister   . Lupus Sister   . Diabetes Sister   . Breast cancer Maternal Aunt 70  . Leukemia Maternal Aunt     ALLERGIES:  is allergic to codeine.  MEDICATIONS:  Current Outpatient Medications  Medication Sig Dispense Refill  . acetaminophen (TYLENOL) 500 MG tablet Take 1 tablet (500 mg total) by mouth every 6 (six) hours as needed. (Patient taking differently: Take 1,000 mg by mouth every 6 (six) hours as needed. ) 480 tablet 0    . albuterol (ACCUNEB) 1.25 MG/3ML nebulizer solution Take 3 mLs (1.25 mg total) by nebulization 2 (two) times daily. 75 mL 2  . allopurinol (ZYLOPRIM) 300 MG tablet Take 1 tablet (300 mg total) by mouth daily. 90 tablet 4  . aspirin 81 MG tablet Take 1 tablet by mouth daily.    [name] Kitchen atorvastatin (LIPITOR) 40 MG tablet Daily for cholesterol 90 tablet 3  . benazepril (LOTENSIN) 20 MG tablet Take 1 tablet (20 mg total) by mouth daily. 90 tablet 0  . Blood Glucose Monitoring Suppl (TRUE METRIX AIR GLUCOSE METER) w/Device KIT 1 each by Does not apply route 1 day or 1 dose. 1 kit  0  . budesonide (PULMICORT) 0.25 MG/2ML nebulizer solution Take 2 mLs (0.25 mg total) by nebulization 2 (two) times daily. 60 mL 2  . colchicine 0.6 MG tablet Take 1 tablet by mouth 2 (two) times daily. Takes as needed for flares    . dexamethasone (DECADRON) 0.1 % ophthalmic solution INSTILL 1 DROP INTO LEFT EYE 4 TIMES A DAY X 1 WK, THEN TWICE A DAY X 1 WK.  1  . diclofenac sodium (VOLTAREN) 1 % GEL Apply 4 g topically 4 (four) times daily. 100 g 1  . furosemide (LASIX) 40 MG tablet TAKE 1 TABLET 2 TIMES DAILYAS NEEDED 90 tablet 1  . gabapentin (NEURONTIN) 300 MG capsule TAKE 1 CAPSULE THREE TIMES DAILY 270 capsule 3  . glucose blood test strip Use as instructed 100 each 12  . Insulin Pen Needle 32G X 6 MM MISC 1 each by Does not apply route 4 (four) times daily. 200 each 2  . Lancets (ONETOUCH ULTRASOFT) lancets Use as instructed 100 each 12  . liraglutide (VICTOZA) 18 MG/3ML SOPN Inject 0.3 mLs (1.8 mg total) into the skin daily. 27 mL 1  . metFORMIN (GLUCOPHAGE) 500 MG tablet TAKE 1 TAB IN MORNING AND 2 TABS IN EVENING 270 tablet 1  . potassium chloride SA (K-DUR,KLOR-CON) 20 MEQ tablet Take 1-2 tablets (20-40 mEq total) by mouth daily. 180 tablet 3  . triamcinolone cream (KENALOG) 0.1 % Apply 1 application topically as directed.    . triamcinolone ointment (KENALOG) 0.1 % APPLY A PEA SIZE AMOUNT EXTERNALLY TO AREA EVERY  OTHER DAY 80 g 1  . Vitamin D, Cholecalciferol, 1000 UNITS TABS Take 1 capsule by mouth daily.     No current facility-administered medications for this visit.      PHYSICAL EXAMINATION: ECOG PERFORMANCE STATUS: 1 - Symptomatic but completely ambulatory Vitals:   04/20/18 1130  BP: 114/65  Pulse: 73  Resp: 18  Temp: (!) 96.5 F (35.8 C)  SpO2: 96%   Filed Weights   04/20/18 1130  Weight: 272 lb 3.2 oz (123.5 kg)    Physical Exam  Constitutional: She is oriented to person, place, and time. She appears well-developed. No distress.  Morbid obese  HENT:  Head: Normocephalic and atraumatic.  Right Ear: External ear normal.  Eyes: Pupils are equal, round, and reactive to light. Conjunctivae and EOM are normal. No scleral icterus.  Neck: Normal range of motion. Neck supple.  Cardiovascular: Normal rate, regular rhythm and normal heart sounds.  Pulmonary/Chest: Effort normal. No respiratory distress. She has no wheezes. She has no rales. She exhibits no tenderness.  Decreased breath sound bilaterally  Abdominal: Soft. Bowel sounds are normal. She exhibits no distension and no mass. There is no tenderness.  Musculoskeletal: Normal range of motion. She exhibits no edema or deformity.  Neurological: She is alert and oriented to person, place, and time. No cranial nerve deficit. Coordination normal.  Skin: Skin is warm and dry. No rash noted.  Psychiatric: She has a normal mood and affect. Her behavior is normal.     LABORATORY DATA:  I have reviewed the data as listed Lab Results  Component Value Date   WBC 7.2 04/11/2018   HGB 10.4 (L) 04/11/2018   HCT 31.7 (L) 04/11/2018   MCV 87.8 04/11/2018   PLT 229 04/11/2018   Recent Labs    12/10/17 1025 04/11/18 1142  NA 138 141  K 5.2 4.9  CL 103 105  CO2 24 26  GLUCOSE 94 105*  BUN 41* 42*  CREATININE 1.20* 1.17*  CALCIUM 9.7 9.6  GFRNONAA  --  47*  GFRAA  --  55*  PROT 6.6 6.9  AST 37* 39*  ALT 44* 33*  BILITOT  0.5 0.5   Iron/TIBC/Ferritin/ %Sat    Component Value Date/Time   IRON 41 (L) 04/11/2018 1142   TIBC 471 (H) 04/11/2018 1142   FERRITIN 16 04/11/2018 1142   IRONPCTSAT 9 (L) 04/11/2018 1142        ASSESSMENT & PLAN:  1. Anemia, unspecified type   2. Iron deficiency anemia, unspecified iron deficiency anemia type    Labs were reviewed and discussed in detail with patient's. Iron panel is consistent with iron deficiency. Discussed with patient that, her anemia is most likely a combination of both iron deficiency anemia as well as anemia secondary to CKD. I recommend IV iron. Plan IV iron with Venofer 237m twice a week for 4 doses. Allergy reactions/infusion reaction including anaphylactic reaction discussed with patient. Patient voices understanding and willing to proceed. Discussed with patient that she needs to make a follow-up appointment with Dr. WAllen Norrisfor further GI evaluation of iron deficiency anemia.  She voices understanding. Repeat CBC, ferritin, iron TIBC in 6 weeks and reassessed for need of additional Venofer  Orders Placed This Encounter  Procedures  . CBC with Differential/Platelet    Standing Status:   Future    Standing Expiration Date:   04/21/2019  . Ferritin    Standing Status:   Future    Standing Expiration Date:   04/21/2019  . Iron and TIBC    Standing Status:   Future    Standing Expiration Date:   04/21/2019    All questions were answered. The patient knows to call the clinic with any problems questions or concerns.  Thank you for this kind referral and the opportunity to participate in the care of this patient. A copy of today's note is routed to referring provider  Total face to face encounter time for this patient visit was 40 min. >50% of the time was  spent in counseling and coordination of care.    Cc Dr.Sowel  Cc Dr.Wohl ZEarlie Server MD, PhD Hematology Oncology CBarnet [facility] PLLCat ATexas Emergency HospitalPager- [phone]/12/2017

## 2018-04-20 NOTE — Progress Notes (Signed)
Patient here for initial evaluation.  °

## 2018-04-27 ENCOUNTER — Inpatient Hospital Stay: Payer: Medicare HMO

## 2018-04-27 VITALS — BP 106/66 | HR 83 | Temp 96.6°F | Resp 18

## 2018-04-27 DIAGNOSIS — D509 Iron deficiency anemia, unspecified: Secondary | ICD-10-CM

## 2018-04-27 DIAGNOSIS — M25561 Pain in right knee: Secondary | ICD-10-CM | POA: Diagnosis not present

## 2018-04-27 DIAGNOSIS — E119 Type 2 diabetes mellitus without complications: Secondary | ICD-10-CM | POA: Diagnosis not present

## 2018-04-27 DIAGNOSIS — M1711 Unilateral primary osteoarthritis, right knee: Secondary | ICD-10-CM | POA: Diagnosis not present

## 2018-04-27 DIAGNOSIS — M1712 Unilateral primary osteoarthritis, left knee: Secondary | ICD-10-CM | POA: Diagnosis not present

## 2018-04-27 DIAGNOSIS — Z87891 Personal history of nicotine dependence: Secondary | ICD-10-CM | POA: Diagnosis not present

## 2018-04-27 DIAGNOSIS — G8929 Other chronic pain: Secondary | ICD-10-CM | POA: Diagnosis not present

## 2018-04-27 MED ORDER — IRON SUCROSE 20 MG/ML IV SOLN
200.0000 mg | Freq: Once | INTRAVENOUS | Status: AC
  Administered 2018-04-27: 200 mg via INTRAVENOUS
  Filled 2018-04-27: qty 10

## 2018-04-27 MED ORDER — SODIUM CHLORIDE 0.9 % IV SOLN
Freq: Once | INTRAVENOUS | Status: AC
  Administered 2018-04-27: 14:00:00 via INTRAVENOUS
  Filled 2018-04-27: qty 1000

## 2018-04-27 NOTE — Progress Notes (Signed)
Pt tolerated Venofer treatment well. Pt and VS stable at discharge.

## 2018-05-02 DIAGNOSIS — R69 Illness, unspecified: Secondary | ICD-10-CM | POA: Diagnosis not present

## 2018-05-04 ENCOUNTER — Inpatient Hospital Stay: Payer: Medicare HMO

## 2018-05-04 VITALS — BP 111/71 | HR 80 | Temp 97.0°F | Resp 18

## 2018-05-04 DIAGNOSIS — D509 Iron deficiency anemia, unspecified: Secondary | ICD-10-CM | POA: Diagnosis not present

## 2018-05-04 DIAGNOSIS — E119 Type 2 diabetes mellitus without complications: Secondary | ICD-10-CM | POA: Diagnosis not present

## 2018-05-04 DIAGNOSIS — Z87891 Personal history of nicotine dependence: Secondary | ICD-10-CM | POA: Diagnosis not present

## 2018-05-04 MED ORDER — SODIUM CHLORIDE 0.9 % IV SOLN
Freq: Once | INTRAVENOUS | Status: AC
  Administered 2018-05-04: 14:00:00 via INTRAVENOUS
  Filled 2018-05-04: qty 1000

## 2018-05-04 MED ORDER — IRON SUCROSE 20 MG/ML IV SOLN
200.0000 mg | Freq: Once | INTRAVENOUS | Status: AC
  Administered 2018-05-04: 200 mg via INTRAVENOUS
  Filled 2018-05-04: qty 10

## 2018-05-06 ENCOUNTER — Other Ambulatory Visit: Payer: Self-pay

## 2018-05-06 DIAGNOSIS — E1122 Type 2 diabetes mellitus with diabetic chronic kidney disease: Secondary | ICD-10-CM

## 2018-05-06 DIAGNOSIS — M5416 Radiculopathy, lumbar region: Secondary | ICD-10-CM

## 2018-05-06 DIAGNOSIS — R600 Localized edema: Secondary | ICD-10-CM

## 2018-05-06 DIAGNOSIS — N183 Chronic kidney disease, stage 3 unspecified: Secondary | ICD-10-CM

## 2018-05-06 DIAGNOSIS — N182 Chronic kidney disease, stage 2 (mild): Secondary | ICD-10-CM

## 2018-05-06 DIAGNOSIS — I10 Essential (primary) hypertension: Secondary | ICD-10-CM

## 2018-05-06 DIAGNOSIS — M109 Gout, unspecified: Secondary | ICD-10-CM

## 2018-05-06 DIAGNOSIS — E785 Hyperlipidemia, unspecified: Secondary | ICD-10-CM

## 2018-05-06 NOTE — Telephone Encounter (Signed)
Copied from [place] [id number]. Topic: General - Other >> May 06, 2018  3:22 PM Carolyn Stare wrote:  Below pharmacy would like a call back to discuss pt meds   [place], [place] - [place] [phone] (Phone)    I returned Drew's call and he asked if we can send new Rxs for this patient so that they can have the correct information on file since she only brought her bottles to their pharmacy when [facility] closed

## 2018-05-07 MED ORDER — POTASSIUM CHLORIDE CRYS ER 20 MEQ PO TBCR: 20.0000 meq | EXTENDED_RELEASE_TABLET | Freq: Every day | ORAL | 3 refills | Status: DC

## 2018-05-07 MED ORDER — GABAPENTIN 300 MG PO CAPS: ORAL_CAPSULE | ORAL | 3 refills | Status: DC

## 2018-05-07 MED ORDER — BENAZEPRIL HCL 20 MG PO TABS: 20.0000 mg | ORAL_TABLET | Freq: Every day | ORAL | 3 refills | Status: DC

## 2018-05-07 MED ORDER — ATORVASTATIN CALCIUM 40 MG PO TABS: ORAL_TABLET | ORAL | 3 refills | Status: DC

## 2018-05-07 MED ORDER — FUROSEMIDE 40 MG PO TABS: ORAL_TABLET | ORAL | 3 refills | Status: DC

## 2018-05-07 MED ORDER — ALLOPURINOL 300 MG PO TABS: 300.0000 mg | ORAL_TABLET | Freq: Every day | ORAL | 3 refills | Status: DC

## 2018-05-07 MED ORDER — METFORMIN HCL 500 MG PO TABS: ORAL_TABLET | ORAL | 3 refills | Status: DC

## 2018-05-11 ENCOUNTER — Inpatient Hospital Stay: Payer: Medicare HMO

## 2018-05-11 VITALS — BP 122/61 | HR 79 | Temp 97.9°F

## 2018-05-11 DIAGNOSIS — D509 Iron deficiency anemia, unspecified: Secondary | ICD-10-CM

## 2018-05-11 DIAGNOSIS — Z87891 Personal history of nicotine dependence: Secondary | ICD-10-CM | POA: Diagnosis not present

## 2018-05-11 DIAGNOSIS — E119 Type 2 diabetes mellitus without complications: Secondary | ICD-10-CM | POA: Diagnosis not present

## 2018-05-11 MED ORDER — IRON SUCROSE 20 MG/ML IV SOLN
200.0000 mg | Freq: Once | INTRAVENOUS | Status: AC
  Administered 2018-05-11: 200 mg via INTRAVENOUS
  Filled 2018-05-11: qty 10

## 2018-05-11 MED ORDER — SODIUM CHLORIDE 0.9 % IV SOLN
Freq: Once | INTRAVENOUS | Status: AC
  Administered 2018-05-11: 14:00:00 via INTRAVENOUS
  Filled 2018-05-11: qty 1000

## 2018-05-13 ENCOUNTER — Telehealth: Payer: Self-pay | Admitting: Family Medicine

## 2018-05-13 NOTE — Telephone Encounter (Signed)
Copied from [place] [phone]. Topic: General - Other >> May 13, 2018 12:51 PM Lennox Solders wrote: Reason for CRM: pt is calling and no longer wants to use [facility] for her oxygen needs and cpap supplies. Pt would like to use [facility] for her oxygen and cpap supplies. Please call [phone] fax number [phone]. Pt said [facility] will pick up her oxygen on 05-16-18

## 2018-05-13 NOTE — Telephone Encounter (Signed)
Okay to give verbal order, or fill a paper rx and I will sign it. Thank you

## 2018-05-13 NOTE — Telephone Encounter (Signed)
Spoke with [facility] on 05/13/18 at 2:03 p.m. And she told me we are unable to do anything at the present moment. Huey Romans has to send the patient CNM from [place] and fax it to [phone]. But they are unable to do anything until she gets the billing sent to her incase she is [place].

## 2018-05-16 DIAGNOSIS — G4733 Obstructive sleep apnea (adult) (pediatric): Secondary | ICD-10-CM | POA: Diagnosis not present

## 2018-05-18 ENCOUNTER — Inpatient Hospital Stay: Payer: Medicare HMO

## 2018-05-18 VITALS — BP 139/73 | HR 79 | Temp 96.7°F | Resp 20

## 2018-05-18 DIAGNOSIS — D509 Iron deficiency anemia, unspecified: Secondary | ICD-10-CM | POA: Diagnosis not present

## 2018-05-18 DIAGNOSIS — E119 Type 2 diabetes mellitus without complications: Secondary | ICD-10-CM | POA: Diagnosis not present

## 2018-05-18 DIAGNOSIS — Z87891 Personal history of nicotine dependence: Secondary | ICD-10-CM | POA: Diagnosis not present

## 2018-05-18 MED ORDER — SODIUM CHLORIDE 0.9 % IV SOLN
Freq: Once | INTRAVENOUS | Status: AC
  Administered 2018-05-18: 14:00:00 via INTRAVENOUS
  Filled 2018-05-18: qty 1000

## 2018-05-18 MED ORDER — IRON SUCROSE 20 MG/ML IV SOLN
200.0000 mg | Freq: Once | INTRAVENOUS | Status: AC
  Administered 2018-05-18: 200 mg via INTRAVENOUS
  Filled 2018-05-18: qty 10

## 2018-05-31 ENCOUNTER — Inpatient Hospital Stay: Payer: Medicare HMO | Attending: Oncology

## 2018-05-31 ENCOUNTER — Other Ambulatory Visit: Payer: Self-pay

## 2018-05-31 DIAGNOSIS — M109 Gout, unspecified: Secondary | ICD-10-CM | POA: Insufficient documentation

## 2018-05-31 DIAGNOSIS — N189 Chronic kidney disease, unspecified: Secondary | ICD-10-CM | POA: Insufficient documentation

## 2018-05-31 DIAGNOSIS — E119 Type 2 diabetes mellitus without complications: Secondary | ICD-10-CM | POA: Diagnosis not present

## 2018-05-31 DIAGNOSIS — Z87891 Personal history of nicotine dependence: Secondary | ICD-10-CM | POA: Diagnosis not present

## 2018-05-31 DIAGNOSIS — Z79899 Other long term (current) drug therapy: Secondary | ICD-10-CM | POA: Diagnosis not present

## 2018-05-31 DIAGNOSIS — Z794 Long term (current) use of insulin: Secondary | ICD-10-CM | POA: Insufficient documentation

## 2018-05-31 DIAGNOSIS — D509 Iron deficiency anemia, unspecified: Secondary | ICD-10-CM | POA: Insufficient documentation

## 2018-05-31 DIAGNOSIS — I10 Essential (primary) hypertension: Secondary | ICD-10-CM | POA: Diagnosis not present

## 2018-05-31 DIAGNOSIS — D649 Anemia, unspecified: Secondary | ICD-10-CM

## 2018-05-31 LAB — CBC WITH DIFFERENTIAL/PLATELET
Basophils Absolute: 0.1 10*3/uL (ref 0–0.1)
Basophils Relative: 1 %
Eosinophils Absolute: 0.4 10*3/uL (ref 0–0.7)
Eosinophils Relative: 5 %
HCT: 33.7 % — ABNORMAL LOW (ref 35.0–47.0)
Hemoglobin: 11 g/dL — ABNORMAL LOW (ref 12.0–16.0)
Lymphocytes Relative: 20 %
Lymphs Abs: 1.5 10*3/uL (ref 1.0–3.6)
MCH: 30.6 pg (ref 26.0–34.0)
MCHC: 32.5 g/dL (ref 32.0–36.0)
MCV: 94.2 fL (ref 80.0–100.0)
Monocytes Absolute: 0.8 10*3/uL (ref 0.2–0.9)
Monocytes Relative: 10 %
Neutro Abs: 4.9 10*3/uL (ref 1.4–6.5)
Neutrophils Relative %: 64 %
Platelets: 261 10*3/uL (ref 150–440)
RBC: 3.57 MIL/uL — ABNORMAL LOW (ref 3.80–5.20)
RDW: 20.2 % — ABNORMAL HIGH (ref 11.5–14.5)
WBC: 7.6 10*3/uL (ref 3.6–11.0)

## 2018-05-31 LAB — IRON AND TIBC
Iron: 42 ug/dL (ref 28–170)
Saturation Ratios: 11 % (ref 10.4–31.8)
TIBC: 390 ug/dL (ref 250–450)
UIBC: 349 ug/dL

## 2018-05-31 LAB — FERRITIN: Ferritin: 65 ng/mL (ref 11–307)

## 2018-06-01 ENCOUNTER — Inpatient Hospital Stay: Payer: Medicare HMO

## 2018-06-01 ENCOUNTER — Other Ambulatory Visit: Payer: Self-pay

## 2018-06-01 ENCOUNTER — Inpatient Hospital Stay (HOSPITAL_BASED_OUTPATIENT_CLINIC_OR_DEPARTMENT_OTHER): Payer: Medicare HMO | Admitting: Oncology

## 2018-06-01 ENCOUNTER — Encounter: Payer: Self-pay | Admitting: Oncology

## 2018-06-01 VITALS — BP 121/72 | HR 89 | Temp 97.2°F | Resp 18 | Wt 274.0 lb

## 2018-06-01 VITALS — BP 114/73 | HR 80 | Temp 96.7°F | Resp 18

## 2018-06-01 DIAGNOSIS — Z87891 Personal history of nicotine dependence: Secondary | ICD-10-CM | POA: Diagnosis not present

## 2018-06-01 DIAGNOSIS — Z79899 Other long term (current) drug therapy: Secondary | ICD-10-CM | POA: Diagnosis not present

## 2018-06-01 DIAGNOSIS — D509 Iron deficiency anemia, unspecified: Secondary | ICD-10-CM

## 2018-06-01 DIAGNOSIS — I10 Essential (primary) hypertension: Secondary | ICD-10-CM

## 2018-06-01 DIAGNOSIS — E119 Type 2 diabetes mellitus without complications: Secondary | ICD-10-CM

## 2018-06-01 DIAGNOSIS — N189 Chronic kidney disease, unspecified: Secondary | ICD-10-CM

## 2018-06-01 DIAGNOSIS — M109 Gout, unspecified: Secondary | ICD-10-CM | POA: Diagnosis not present

## 2018-06-01 DIAGNOSIS — Z794 Long term (current) use of insulin: Secondary | ICD-10-CM | POA: Diagnosis not present

## 2018-06-01 DIAGNOSIS — D649 Anemia, unspecified: Secondary | ICD-10-CM

## 2018-06-01 MED ORDER — IRON SUCROSE 20 MG/ML IV SOLN
200.0000 mg | Freq: Once | INTRAVENOUS | Status: AC
  Administered 2018-06-01: 200 mg via INTRAVENOUS
  Filled 2018-06-01: qty 10

## 2018-06-01 MED ORDER — SODIUM CHLORIDE 0.9 % IV SOLN
INTRAVENOUS | Status: DC
  Administered 2018-06-01: 15:00:00 via INTRAVENOUS
  Filled 2018-06-01: qty 1000

## 2018-06-01 NOTE — Progress Notes (Signed)
Patient here for follow up. Pt concerned about ankle swelling.

## 2018-06-01 NOTE — Progress Notes (Signed)
Hematology/Oncology  Follow up note [facility] [phone]) [phone] [phone]) [phone]   Patient Care Team: Steele Sizer, MD as PCP - General (Family Medicine) Bryson Ha, OD as Consulting Physician (Optometry) Sharlet Salina, MD as Referring Physician (Physical Medicine and Rehabilitation)  REFERRING PROVIDER: Steele Sizer, MD CHIEF COMPLAINTS/REASON FOR VISIT:  Evaluation of anemia  HISTORY OF PRESENTING ILLNESS:  Amber Avery is a  72 y.o.  female with PMH listed below who was referred to me for evaluation of anemia. Patient recently had lab work done ordered by primary care physician.  Labs were reviewed by me.  Revealed anemia with hemoglobin of 10.4, MCV 87.8, platelet counts 229, normal WBC count 7.2, normal differential. Iron decreased panel showed saturation at 9, TIBC 471, ferritin 16, iron 41. Creatinine 1.17, estimated GFR 47. Reviewed patient's previous labs ordered by primary care physician's office, anemia duration is chronic, dated back to 2018, ranges between 10-11.No aggravating or improving factors.  Associated symptoms: Associated with fatigue and SOB with exertion.  Context: Denies weight loss, easy bruising, hematochezia, hemoptysis, hematuria.   Context: History of GI bleeding: Denies               History of CKD yes:, Estimated GFR 47.               History of autoimmune disease denies               History of hemolytic anemia.  Denies           Last colonoscopy was done in 2017    which showed diverticulitis, nonbleeding hemorrhoids.  In addition patient also reports history of peptic ulcer disease stomach discomfort.  Denies seeing any melena.  INTERVAL HISTORY Amber Avery is a 72 y.o. female who has above history reviewed by me today presents for follow up visit for management of iron deficiency anemia. Patient reports that her fatigue is slightly better however not to her baseline yet. She reports  tolerating for IV Venofer treatment during interval.  However IV Venofer causes diarrhea episodes after work.  Spontaneously resolved. Denies any abdominal cramps rectal bleeding.  Review of Systems  Constitutional: Positive for malaise/fatigue. Negative for chills, fever and weight loss.  HENT: Negative for congestion, ear discharge, ear pain, nosebleeds, sinus pain and sore throat.   Eyes: Negative for double vision, photophobia, pain, discharge and redness.  Respiratory: Negative for cough, hemoptysis, sputum production, shortness of breath and wheezing.   Cardiovascular: Negative for chest pain, palpitations, orthopnea, claudication and leg swelling.  Gastrointestinal: Negative for abdominal pain, blood in stool, constipation, diarrhea, heartburn, melena, nausea and vomiting.       Stomach discomfort  Genitourinary: Negative for dysuria, flank pain, frequency and hematuria.  Musculoskeletal: Negative for back pain, myalgias and neck pain.  Skin: Negative for itching and rash.  Neurological: Negative for dizziness, tingling, tremors, focal weakness, weakness and headaches.  Endo/Heme/Allergies: Negative for environmental allergies. Does not bruise/bleed easily.  Psychiatric/Behavioral: Negative for depression and hallucinations. The patient is not nervous/anxious.     MEDICAL HISTORY:  Past Medical History:  Diagnosis Date  . Asthma   . Diabetes mellitus without complication ([place])   . Gout   . Hyperlipidemia   . Hypertension   . Iron deficiency anemia 04/20/2018  . PONV (postoperative nausea and vomiting)   . Shortness of breath dyspnea   . Sleep apnea     SURGICAL HISTORY: Past Surgical History:  Procedure Laterality Date  . APPENDECTOMY    .  CHOLECYSTECTOMY  1977  . COLONOSCOPY WITH PROPOFOL N/A 01/21/2016   Procedure: COLONOSCOPY WITH PROPOFOL;  Surgeon: Lucilla Lame, MD;  Location: [facility] ENDOSCOPY;  Service: Endoscopy;  Laterality: N/A;  . DILATION AND CURETTAGE OF UTERUS       Due to Amenorrhea  . HEMORRHOID BANDING  1980  . HEMORROIDECTOMY    . HERNIA REPAIR  2011  . Temporal Area Excision Biopsy     For Birth Mark Changes, Negative Pathology  . TONSILLECTOMY AND ADENOIDECTOMY      SOCIAL HISTORY: Social History   Socioeconomic History  . Marital status: Divorced    Spouse name: Not on file  . Number of children: Not on file  . Years of education: Not on file  . Highest education level: Not on file  Occupational History  . Not on file  Social Needs  . Financial resource strain: Not on file  . Food insecurity:    Worry: Not on file    Inability: Not on file  . Transportation needs:    Medical: Not on file    Non-medical: Not on file  Tobacco Use  . Smoking status: Former Smoker    Years: 15.00  . Smokeless tobacco: Never Used  . Tobacco comment: Quit at age 17  Substance and Sexual Activity  . Alcohol use: Yes    Alcohol/week: 0.0 standard drinks    Comment: rare wine  . Drug use: No  . Sexual activity: Not Currently  Lifestyle  . Physical activity:    Days per week: Not on file    Minutes per session: Not on file  . Stress: Not on file  Relationships  . Social connections:    Talks on phone: Not on file    Gets together: Not on file    Attends religious service: Not on file    Active member of club or organization: Not on file    Attends meetings of clubs or organizations: Not on file    Relationship status: Not on file  . Intimate partner violence:    Fear of current or ex partner: Not on file    Emotionally abused: Not on file    Physically abused: Not on file    Forced sexual activity: Not on file  Other Topics Concern  . Not on file  Social History Narrative  . Not on file    FAMILY HISTORY: Family History  Problem Relation Age of Onset  . Cancer Mother        brain tumor  . Kidney disease Mother   . Hypertension Mother   . Heart disease Father   . Hypertension Sister   . Arthritis Sister   . Cancer Sister    . Hernia Sister   . GER disease Sister   . Lupus Sister   . Diabetes Sister   . Breast cancer Maternal Aunt 70  . Leukemia Maternal Aunt     ALLERGIES:  is allergic to codeine.  MEDICATIONS:  Current Outpatient Medications  Medication Sig Dispense Refill  . acetaminophen (TYLENOL) 500 MG tablet Take 1 tablet (500 mg total) by mouth every 6 (six) hours as needed. (Patient taking differently: Take 1,000 mg by mouth every 6 (six) hours as needed. ) 480 tablet 0  . albuterol (ACCUNEB) 1.25 MG/3ML nebulizer solution Take 3 mLs (1.25 mg total) by nebulization 2 (two) times daily. 75 mL 2  . allopurinol (ZYLOPRIM) 300 MG tablet Take 1 tablet (300 mg total) by mouth daily. 90 tablet 3  .  aspirin 81 MG tablet Take 1 tablet by mouth daily.    [name] Kitchen atorvastatin (LIPITOR) 40 MG tablet Daily for cholesterol 90 tablet 3  . benazepril (LOTENSIN) 20 MG tablet Take 1 tablet (20 mg total) by mouth daily. 90 tablet 3  . Blood Glucose Monitoring Suppl (TRUE METRIX AIR GLUCOSE METER) w/Device KIT 1 each by Does not apply route 1 day or 1 dose. 1 kit 0  . budesonide (PULMICORT) 0.25 MG/2ML nebulizer solution Take 2 mLs (0.25 mg total) by nebulization 2 (two) times daily. 60 mL 2  . colchicine 0.6 MG tablet Take 1 tablet by mouth 2 (two) times daily. Takes as needed for flares    . dexamethasone (DECADRON) 0.1 % ophthalmic solution INSTILL 1 DROP INTO LEFT EYE 4 TIMES A DAY X 1 WK, THEN TWICE A DAY X 1 WK.  1  . diclofenac sodium (VOLTAREN) 1 % GEL Apply 4 g topically 4 (four) times daily. 100 g 1  . furosemide (LASIX) 40 MG tablet TAKE 1 TABLET 2 TIMES DAILYAS NEEDED 90 tablet 3  . gabapentin (NEURONTIN) 300 MG capsule TAKE 1 CAPSULE THREE TIMES DAILY 270 capsule 3  . glucose blood test strip Use as instructed 100 each 12  . Insulin Pen Needle 32G X 6 MM MISC 1 each by Does not apply route 4 (four) times daily. 200 each 2  . Lancets (ONETOUCH ULTRASOFT) lancets Use as instructed 100 each 12  . liraglutide  (VICTOZA) 18 MG/3ML SOPN Inject 0.3 mLs (1.8 mg total) into the skin daily. 27 mL 1  . metFORMIN (GLUCOPHAGE) 500 MG tablet TAKE 1 TAB IN MORNING AND 2 TABS IN EVENING 270 tablet 3  . potassium chloride SA (K-DUR,KLOR-CON) 20 MEQ tablet Take 1-2 tablets (20-40 mEq total) by mouth daily. 180 tablet 3  . triamcinolone cream (KENALOG) 0.1 % Apply 1 application topically as directed.    . triamcinolone ointment (KENALOG) 0.1 % APPLY A PEA SIZE AMOUNT EXTERNALLY TO AREA EVERY OTHER DAY 80 g 1  . Vitamin D, Cholecalciferol, 1000 UNITS TABS Take 1 capsule by mouth daily.     No current facility-administered medications for this visit.      PHYSICAL EXAMINATION: ECOG PERFORMANCE STATUS: 1 - Symptomatic but completely ambulatory Vitals:   06/01/18 1345  BP: 121/72  Pulse: 89  Resp: 18  Temp: (!) 97.2 F (36.2 C)  SpO2: 93%   Filed Weights   06/01/18 1345  Weight: 274 lb (124.3 kg)    Physical Exam  Constitutional: She is oriented to person, place, and time. She appears well-developed and well-nourished. No distress.  Morbid obese  HENT:  Head: Normocephalic and atraumatic.  Right Ear: External ear normal.  Left Ear: External ear normal.  Mouth/Throat: Oropharynx is clear and moist.  Eyes: Pupils are equal, round, and reactive to light. Conjunctivae and EOM are normal. No scleral icterus.  Neck: Normal range of motion. Neck supple.  Cardiovascular: Normal rate, regular rhythm and normal heart sounds.  Pulmonary/Chest: Effort normal. No respiratory distress. She has no wheezes. She has no rales. She exhibits no tenderness.  Decreased breath sound bilaterally  Abdominal: Soft. Bowel sounds are normal. She exhibits no distension and no mass. There is no tenderness.  Musculoskeletal: Normal range of motion. She exhibits no edema or deformity.  Neurological: She is alert and oriented to person, place, and time. No cranial nerve deficit. Coordination normal.  Skin: Skin is warm and dry. No  rash noted. No erythema.  Psychiatric: She has  a normal mood and affect. Her behavior is normal. Thought content normal.     LABORATORY DATA:  I have reviewed the data as listed Lab Results  Component Value Date   WBC 7.6 05/31/2018   HGB 11.0 (L) 05/31/2018   HCT 33.7 (L) 05/31/2018   MCV 94.2 05/31/2018   PLT 261 05/31/2018   Recent Labs    12/10/17 1025 04/11/18 1142  NA 138 141  K 5.2 4.9  CL 103 105  CO2 24 26  GLUCOSE 94 105*  BUN 41* 42*  CREATININE 1.20* 1.17*  CALCIUM 9.7 9.6  GFRNONAA  --  47*  GFRAA  --  55*  PROT 6.6 6.9  AST 37* 39*  ALT 44* 33*  BILITOT 0.5 0.5   Iron/TIBC/Ferritin/ %Sat    Component Value Date/Time   IRON 42 05/31/2018 1344   TIBC 390 05/31/2018 1344   FERRITIN 65 05/31/2018 1344   IRONPCTSAT 11 05/31/2018 1344   IRONPCTSAT 9 (L) 04/11/2018 1142        ASSESSMENT & PLAN:  1. Anemia, unspecified type   2. Iron deficiency anemia, unspecified iron deficiency anemia type     #Labs were reviewed and discussed in detail with patient. Anemia has improved to 11.  Iron panel reviewed.  Ferritin improved to 65.  Saturation ratio is still borderline low at 11.  High TIBC at 390.  Discussed with patient that she will benefit from additional IV Venofer.  Plan IV Venofer x1 today.  #Discussed with patient that she needs to follow-up with Dr. Allen Norris for further GI evaluation as she developed iron deficient anemia.  This was previously discussed and the patient has not made a call. We will touch base with Dr. Allen Norris to get patient an appointment  Repeat CBC, ferritin, iron TIBC in 6 weeks and reassessed for need of additional Venofer  Orders Placed This Encounter  Procedures  . CBC with Differential/Platelet    Standing Status:   Future    Standing Expiration Date:   06/02/2019  . Iron and TIBC    Standing Status:   Future    Standing Expiration Date:   06/02/2019  . Ferritin    Standing Status:   Future    Standing Expiration Date:    06/02/2019  . Ambulatory referral to Gastroenterology    Referral Priority:   Routine    Referral Type:   Consultation    Referral Reason:   Specialty Services Required    Referred to Provider:   Lucilla Lame, MD    Number of Visits Requested:   1    All questions were answered. The patient knows to call the clinic with any problems questions or concerns.    Cc Dr.Sowel  Cc Dr.Wohl Earlie Server, MD, PhD Hematology Oncology [facility] Pager- [phone] 06/01/2018

## 2018-06-09 DIAGNOSIS — M25561 Pain in right knee: Secondary | ICD-10-CM | POA: Diagnosis not present

## 2018-06-09 DIAGNOSIS — E042 Nontoxic multinodular goiter: Secondary | ICD-10-CM | POA: Diagnosis not present

## 2018-06-09 DIAGNOSIS — G8929 Other chronic pain: Secondary | ICD-10-CM | POA: Diagnosis not present

## 2018-06-09 DIAGNOSIS — M1711 Unilateral primary osteoarthritis, right knee: Secondary | ICD-10-CM | POA: Diagnosis not present

## 2018-06-15 ENCOUNTER — Encounter: Payer: Self-pay | Admitting: Family Medicine

## 2018-06-15 ENCOUNTER — Ambulatory Visit (INDEPENDENT_AMBULATORY_CARE_PROVIDER_SITE_OTHER): Payer: Medicare HMO | Admitting: Family Medicine

## 2018-06-15 VITALS — BP 118/64 | HR 90 | Temp 98.1°F | Resp 14 | Ht 62.0 in | Wt 280.2 lb

## 2018-06-15 DIAGNOSIS — M5416 Radiculopathy, lumbar region: Secondary | ICD-10-CM | POA: Diagnosis not present

## 2018-06-15 DIAGNOSIS — M170 Bilateral primary osteoarthritis of knee: Secondary | ICD-10-CM | POA: Diagnosis not present

## 2018-06-15 DIAGNOSIS — R2681 Unsteadiness on feet: Secondary | ICD-10-CM | POA: Diagnosis not present

## 2018-06-15 DIAGNOSIS — Z9989 Dependence on other enabling machines and devices: Secondary | ICD-10-CM | POA: Diagnosis not present

## 2018-06-15 NOTE — Progress Notes (Signed)
Name: Amber Avery   MRN: [id number]    DOB: [birth year]   Date:06/15/2018       Progress Note  Subjective  Chief Complaint  Chief Complaint  Patient presents with  . Paperwork    Would like paperwork filled out for Motorized Wheelchair     HPI  Gait problems: she has severe OA she used to only use a cane, however the pain on both knees and ankles has been getting progressively worse , also having daily low back pain and left hip pain. It is affecting her mobility and now has to use a walker in and outside her house. Also having difficulty cooking, cleaning her house, she needs to sit to shower, get dressed.  Pain right now is 7/10. Pain worse during the night and when first gets up. She has bilateral ankle swelling. No history of pressure sores, she is able to shift her weight. She states unable to use cane because both legs are causing problems instead of just one. She has been using walker, but difficulty cooking or cleaning because cannot use her hands while standing. She is not strong enough to use a manual wheelchair, lives alone and cannot self propel due to age and lack of upper body strength She can physically use a scooter, however will not be able to use inside the house where she lives alone and a motorized wheelchair would allow her to do ADL inside the home and also outside.    Patient Active Problem List   Diagnosis Date Noted  . Iron deficiency anemia 04/20/2018  . Anemia, unspecified 04/12/2017  . Lichen sclerosus of female genitalia 06/29/2016  . Primary osteoarthritis of both knees 04/07/2016  . Special screening for malignant neoplasms, colon   . Right thyroid nodule 06/26/2015  . Asthma, intermittent 04/03/2015  . Benign essential HTN 04/03/2015  . Carpal tunnel syndrome 04/03/2015  . Chronic kidney disease (CKD), stage II (mild) 04/03/2015  . Controlled gout 04/03/2015  . Arteriosclerosis of coronary artery 04/03/2015  . Diabetes mellitus with renal  manifestations, controlled ([place]) 04/03/2015  . Dyslipidemia 04/03/2015  . Edema extremities 04/03/2015  . Elevated sedimentation rate 04/03/2015  . Knee pain 04/03/2015  . Lumbar radiculitis 04/03/2015  . Obstructive apnea 04/03/2015  . Lumbosacral spondylosis 04/03/2015  . Osteoarthritis, chronic 04/03/2015  . Psoriasis 04/03/2015  . Vitamin D deficiency 04/03/2015  . Varicose veins 04/03/2015  . Goiter 12/23/2014  . Degeneration of intervertebral disc of lumbar region 08/23/2014  . Corns and callosity 06/11/2009  . Extreme obesity 04/11/2007    Past Surgical History:  Procedure Laterality Date  . APPENDECTOMY    . CHOLECYSTECTOMY  1977  . COLONOSCOPY WITH PROPOFOL N/A 01/21/2016   Procedure: COLONOSCOPY WITH PROPOFOL;  Surgeon: Lucilla Lame, MD;  Location: [facility] ENDOSCOPY;  Service: Endoscopy;  Laterality: N/A;  . DILATION AND CURETTAGE OF UTERUS     Due to Amenorrhea  . HEMORRHOID BANDING  1980  . HEMORROIDECTOMY    . HERNIA REPAIR  2011  . Temporal Area Excision Biopsy     For Birth Mark Changes, Negative Pathology  . TONSILLECTOMY AND ADENOIDECTOMY      Family History  Problem Relation Age of Onset  . Cancer Mother        brain tumor  . Kidney disease Mother   . Hypertension Mother   . Heart disease Father   . Hypertension Sister   . Arthritis Sister   . Cancer Sister   . Hernia Sister   .  GER disease Sister   . Lupus Sister   . Diabetes Sister   . Breast cancer Maternal Aunt 70  . Leukemia Maternal Aunt     Social History   Socioeconomic History  . Marital status: Divorced    Spouse name: Not on file  . Number of children: Not on file  . Years of education: Not on file  . Highest education level: Not on file  Occupational History  . Not on file  Social Needs  . Financial resource strain: Not on file  . Food insecurity:    Worry: Not on file    Inability: Not on file  . Transportation needs:    Medical: Not on file    Non-medical: Not on file   Tobacco Use  . Smoking status: Former Smoker    Years: 15.00  . Smokeless tobacco: Never Used  . Tobacco comment: Quit at age 32  Substance and Sexual Activity  . Alcohol use: Yes    Alcohol/week: 0.0 standard drinks    Comment: rare wine  . Drug use: No  . Sexual activity: Not Currently  Lifestyle  . Physical activity:    Days per week: Not on file    Minutes per session: Not on file  . Stress: Not on file  Relationships  . Social connections:    Talks on phone: Not on file    Gets together: Not on file    Attends religious service: Not on file    Active member of club or organization: Not on file    Attends meetings of clubs or organizations: Not on file    Relationship status: Not on file  . Intimate partner violence:    Fear of current or ex partner: Not on file    Emotionally abused: Not on file    Physically abused: Not on file    Forced sexual activity: Not on file  Other Topics Concern  . Not on file  Social History Narrative  . Not on file     Current Outpatient Medications:  .  acetaminophen (TYLENOL) 500 MG tablet, Take 1 tablet (500 mg total) by mouth every 6 (six) hours as needed. (Patient taking differently: Take 1,000 mg by mouth every 6 (six) hours as needed. ), Disp: 480 tablet, Rfl: 0 .  allopurinol (ZYLOPRIM) 300 MG tablet, Take 1 tablet (300 mg total) by mouth daily., Disp: 90 tablet, Rfl: 3 .  aspirin 81 MG tablet, Take 1 tablet by mouth daily., Disp: , Rfl:  .  atorvastatin (LIPITOR) 40 MG tablet, Daily for cholesterol, Disp: 90 tablet, Rfl: 3 .  benazepril (LOTENSIN) 20 MG tablet, Take 1 tablet (20 mg total) by mouth daily., Disp: 90 tablet, Rfl: 3 .  Blood Glucose Monitoring Suppl (TRUE METRIX AIR GLUCOSE METER) w/Device KIT, 1 each by Does not apply route 1 day or 1 dose., Disp: 1 kit, Rfl: 0 .  budesonide (PULMICORT) 0.25 MG/2ML nebulizer solution, Take 2 mLs (0.25 mg total) by nebulization 2 (two) times daily., Disp: 60 mL, Rfl: 2 .   calcipotriene (DOVONOX) 0.005 % cream, Apply topically., Disp: , Rfl:  .  colchicine 0.6 MG tablet, Take 1 tablet by mouth 2 (two) times daily. Takes as needed for flares, Disp: , Rfl:  .  diclofenac sodium (VOLTAREN) 1 % GEL, Apply 4 g topically 4 (four) times daily., Disp: 100 g, Rfl: 1 .  furosemide (LASIX) 40 MG tablet, TAKE 1 TABLET 2 TIMES DAILYAS NEEDED, Disp: 90 tablet, Rfl: 3 .  gabapentin (NEURONTIN) 300 MG capsule, TAKE 1 CAPSULE THREE TIMES DAILY, Disp: 270 capsule, Rfl: 3 .  glucose blood test strip, Use as instructed, Disp: 100 each, Rfl: 12 .  Insulin Pen Needle 32G X 6 MM MISC, 1 each by Does not apply route 4 (four) times daily., Disp: 200 each, Rfl: 2 .  Lancets (ONETOUCH ULTRASOFT) lancets, Use as instructed, Disp: 100 each, Rfl: 12 .  liraglutide (VICTOZA) 18 MG/3ML SOPN, Inject 0.3 mLs (1.8 mg total) into the skin daily., Disp: 27 mL, Rfl: 1 .  metFORMIN (GLUCOPHAGE) 500 MG tablet, TAKE 1 TAB IN MORNING AND 2 TABS IN EVENING, Disp: 270 tablet, Rfl: 3 .  potassium chloride SA (K-DUR,KLOR-CON) 20 MEQ tablet, Take 1-2 tablets (20-40 mEq total) by mouth daily., Disp: 180 tablet, Rfl: 3 .  triamcinolone cream (KENALOG) 0.1 %, Apply 1 application topically as directed., Disp: , Rfl:  .  triamcinolone ointment (KENALOG) 0.1 %, APPLY A PEA SIZE AMOUNT EXTERNALLY TO AREA EVERY OTHER DAY, Disp: 80 g, Rfl: 1 .  Vitamin D, Cholecalciferol, 1000 UNITS TABS, Take 1 capsule by mouth daily., Disp: , Rfl:  .  albuterol (ACCUNEB) 1.25 MG/3ML nebulizer solution, Take 3 mLs (1.25 mg total) by nebulization 2 (two) times daily. (Patient not taking: Reported on 06/15/2018), Disp: 75 mL, Rfl: 2  Allergies  Allergen Reactions  . Codeine      ROS  Constitutional: Negative for fever , positive for weight change.  Respiratory: Negative for cough and shortness of breath.   Cardiovascular: Negative for chest pain or palpitations.  Gastrointestinal: Negative for abdominal pain, no bowel changes.   Musculoskeletal: Positive  for gait problem and ankle and knee  joint swelling.  Skin: Negative for rash.  Neurological: Negative for dizziness or headache.  No other specific complaints in a complete review of systems (except as listed in HPI above).   Objective  Vitals:   06/15/18 0927  BP: 118/64  Pulse: 90  Resp: 14  Temp: 98.1 F (36.7 C)  TempSrc: Oral  SpO2: 94%  Weight: 280 lb 3.2 oz (127.1 kg)  Height: _0  (1.575 m)    Body mass index is 51.25 kg/m.  Physical Exam  Constitutional: Patient appears well-developed and well-nourished. Obese No distress.  HEENT: head atraumatic, normocephalic, pupils equal and reactive to light,  neck supple, throat within normal limits Cardiovascular: Normal rate, regular rhythm and normal heart sounds.  No murmur heard. Positive  BLE edema in both ankles . Pulmonary/Chest: Effort normal and breath sounds normal. No respiratory distress. Abdominal: Soft.  There is no tenderness. Muscular Skeletal: wide base, slow gait and reaching for furniture when asking to walk without walker. Pain during palpation of left outer hip, normal flexion and extension of spine, mild effusion both knees, but no redness or increase in warmth,  Psychiatric: Patient has a normal mood and affect. behavior is normal. Judgment and thought content normal. Decrease rom of both hips. Strength of upper arms is symmetrical and still able to have a grip but weak, only able to pick up less than 5 lbs because of weakness   Recent Results (from the past 2160 hour(s))  POCT HgB A1C     Status: Abnormal   Collection Time: 04/11/18 10:53 AM  Result Value Ref Range   Hemoglobin A1C 6.5 (A) 4.0 - 5.6 %   HbA1c POC (<> result, manual entry)  4.0 - 5.6 %   HbA1c, POC (prediabetic range)  5.7 - 6.4 %   HbA1c,  POC (controlled diabetic range)  0.0 - 7.0 %  POCT UA - Microalbumin     Status: None   Collection Time: 04/11/18 10:53 AM  Result Value Ref Range   Microalbumin Ur,  POC 20 mg/L   Creatinine, POC  mg/dL   Albumin/Creatinine Ratio, Urine, POC    CBC with Differential/Platelet     Status: Abnormal   Collection Time: 04/11/18 11:42 AM  Result Value Ref Range   WBC 7.2 3.8 - 10.8 Thousand/uL   RBC 3.61 (L) 3.80 - 5.10 Million/uL   Hemoglobin 10.4 (L) 11.7 - 15.5 g/dL   HCT 31.7 (L) 35.0 - 45.0 %   MCV 87.8 80.0 - 100.0 fL   MCH 28.8 27.0 - 33.0 pg   MCHC 32.8 32.0 - 36.0 g/dL   RDW 15.2 (H) 11.0 - 15.0 %   Platelets 229 140 - 400 Thousand/uL   MPV 11.2 7.5 - 12.5 fL   Neutro Abs 4,478 1,500 - 7,800 cells/uL   Lymphs Abs 1,613 850 - 3,900 cells/uL   WBC mixed population 770 200 - 950 cells/uL   Eosinophils Absolute 288 15 - 500 cells/uL   Basophils Absolute 50 0 - 200 cells/uL   Neutrophils Relative % 62.2 %   Total Lymphocyte 22.4 %   Monocytes Relative 10.7 %   Eosinophils Relative 4.0 %   Basophils Relative 0.7 %  COMPLETE METABOLIC PANEL WITH GFR     Status: Abnormal   Collection Time: 04/11/18 11:42 AM  Result Value Ref Range   Glucose, Bld 105 (H) 65 - 99 mg/dL    Comment: .            Fasting reference interval . For someone without known diabetes, a glucose value between 100 and 125 mg/dL is consistent with prediabetes and should be confirmed with a follow-up test. .    BUN 42 (H) 7 - 25 mg/dL   Creat 1.17 (H) 0.60 - 0.93 mg/dL    Comment: For patients >36 years of age, the reference limit for Creatinine is approximately 13% higher for people identified as African-American. .    GFR, Est Non African American 47 (L) > OR = 60 mL/min/1.43m   GFR, Est African American 55 (L) > OR = 60 mL/min/1.723m  BUN/Creatinine Ratio 36 (H) 6 - 22 (calc)   Sodium 141 135 - 146 mmol/L   Potassium 4.9 3.5 - 5.3 mmol/L   Chloride 105 98 - 110 mmol/L   CO2 26 20 - 32 mmol/L   Calcium 9.6 8.6 - 10.4 mg/dL   Total Protein 6.9 6.1 - 8.1 g/dL   Albumin 4.1 3.6 - 5.1 g/dL   Globulin 2.8 1.9 - 3.7 g/dL (calc)   AG Ratio 1.5 1.0 - 2.5 (calc)    Total Bilirubin 0.5 0.2 - 1.2 mg/dL   Alkaline phosphatase (APISO) 105 33 - 130 U/L   AST 39 (H) 10 - 35 U/L   ALT 33 (H) 6 - 29 U/L  Iron, TIBC and Ferritin Panel     Status: Abnormal   Collection Time: 04/11/18 11:42 AM  Result Value Ref Range   Iron 41 (L) 45 - 160 mcg/dL   TIBC 471 (H) 250 - 450 mcg/dL (calc)   %SAT 9 (L) 16 - 45 % (calc)   Ferritin 16 16 - 288 ng/mL  HM DIABETES EYE EXAM     Status: None   Collection Time: 04/12/18 12:00 AM  Result Value Ref Range   HM Diabetic  Eye Exam No Retinopathy No Retinopathy    Comment: Dr. Kerin Ransom at [facility] in 1 yr  Iron and TIBC     Status: None   Collection Time: 05/31/18  1:44 PM  Result Value Ref Range   Iron 42 28 - 170 ug/dL   TIBC 390 250 - 450 ug/dL   Saturation Ratios 11 10.4 - 31.8 %   UIBC 349 ug/dL    Comment: Performed at [facility], [address] [place], [place] [address]  Ferritin     Status: None   Collection Time: 05/31/18  1:44 PM  Result Value Ref Range   Ferritin 65 11 - 307 ng/mL    Comment: Performed at [facility], [address] [place], [place] [address]  CBC with Differential/Platelet     Status: Abnormal   Collection Time: 05/31/18  1:44 PM  Result Value Ref Range   WBC 7.6 3.6 - 11.0 K/uL   RBC 3.57 (L) 3.80 - 5.20 MIL/uL   Hemoglobin 11.0 (L) 12.0 - 16.0 g/dL   HCT 33.7 (L) 35.0 - 47.0 %   MCV 94.2 80.0 - 100.0 fL   MCH 30.6 26.0 - 34.0 pg   MCHC 32.5 32.0 - 36.0 g/dL   RDW 20.2 (H) 11.5 - 14.5 %   Platelets 261 150 - 440 K/uL   Neutrophils Relative % 64 %   Neutro Abs 4.9 1.4 - 6.5 K/uL   Lymphocytes Relative 20 %   Lymphs Abs 1.5 1.0 - 3.6 K/uL   Monocytes Relative 10 %   Monocytes Absolute 0.8 0.2 - 0.9 K/uL   Eosinophils Relative 5 %   Eosinophils Absolute 0.4 0 - 0.7 K/uL   Basophils Relative 1 %   Basophils Absolute 0.1 0 - 0.1 K/uL    Comment: Performed at [facility], [address] [place], [place] [address]      PHQ2/9: Depression screen [facility] 2/9 06/15/2018 04/11/2018 07/01/2017 06/14/2017 08/07/2016  Decreased Interest 0 0 0 0 0  Down, Depressed, Hopeless 0 0 0 0 0  PHQ - 2 Score 0 0 0 0 0  Altered sleeping - 1 - - -  Tired, decreased energy - 1 - - -  Change in appetite - 0 - - -  Feeling bad or failure about yourself  - 0 - - -  Trouble concentrating - 1 - - -  Moving slowly or fidgety/restless - 0 - - -  Suicidal thoughts - 0 - - -  PHQ-9 Score - 3 - - -  Difficult doing work/chores - Not difficult at all - - -     Fall Risk: Fall Risk  05/11/2018 05/04/2018 04/11/2018 12/10/2017 07/01/2017  Falls in the past year? _0   Risk for fall due to : - Impaired mobility - - -     Functional Status Survey: Is the patient deaf or have difficulty hearing?: No Does the patient have difficulty seeing, even when wearing glasses/contacts?: Yes(glasses) Does the patient have difficulty concentrating, remembering, or making decisions?: No Does the patient have difficulty walking or climbing stairs?: Yes Does the patient have difficulty dressing or bathing?: Yes Does the patient have difficulty doing errands alone such as visiting a doctor's office or shopping?: No    Assessment & Plan  1. Primary osteoarthritis of both knees  rx and forms filled out for power mobility device She is motivated and willing to use it to improve her ADL at home  She is physically and mentally able to operate a power mobility device.   She has a lot of pain, and unable to walk without assistance device, had synvisc injections but not a candidate for surgery  2. Lumbar back pain with radiculopathy affecting left lower extremity  Affecting ADL  3. Morbid obesity due to excess calories [facility])  Discussed with the patient the risk posed by an increased BMI. Discussed importance of portion control, calorie counting and at least 150 minutes of physical activity weekly. Avoid sweet beverages and drink more  water. Eat at least 6 servings of fruit and vegetables daily   4. Uses walker  But unable to cook, clean using walker   5. Gait instability  Feels unsteady and has to sit to do any ADL

## 2018-06-16 DIAGNOSIS — G4733 Obstructive sleep apnea (adult) (pediatric): Secondary | ICD-10-CM | POA: Diagnosis not present

## 2018-06-17 ENCOUNTER — Telehealth: Payer: Self-pay

## 2018-06-17 ENCOUNTER — Other Ambulatory Visit: Payer: Self-pay

## 2018-06-17 ENCOUNTER — Ambulatory Visit (INDEPENDENT_AMBULATORY_CARE_PROVIDER_SITE_OTHER): Payer: Medicare HMO

## 2018-06-17 ENCOUNTER — Other Ambulatory Visit: Payer: Self-pay | Admitting: Family Medicine

## 2018-06-17 VITALS — BP 110/62 | HR 70 | Temp 98.0°F | Resp 14 | Ht 62.0 in | Wt 279.7 lb

## 2018-06-17 DIAGNOSIS — Z0000 Encounter for general adult medical examination without abnormal findings: Secondary | ICD-10-CM

## 2018-06-17 DIAGNOSIS — Z1231 Encounter for screening mammogram for malignant neoplasm of breast: Secondary | ICD-10-CM

## 2018-06-17 DIAGNOSIS — Z23 Encounter for immunization: Secondary | ICD-10-CM | POA: Diagnosis not present

## 2018-06-17 DIAGNOSIS — G4733 Obstructive sleep apnea (adult) (pediatric): Secondary | ICD-10-CM

## 2018-06-17 DIAGNOSIS — Z1239 Encounter for other screening for malignant neoplasm of breast: Secondary | ICD-10-CM

## 2018-06-17 NOTE — Telephone Encounter (Signed)
Order placed

## 2018-06-17 NOTE — Progress Notes (Addendum)
Subjective:   Amber Avery is a 72 y.o. female who presents for Medicare Annual (Subsequent) preventive examination.  Review of Systems:  N/A Cardiac Risk Factors include: advanced age (>2mn, >>40women);diabetes mellitus;dyslipidemia;hypertension;obesity (BMI >30kg/m2);sedentary lifestyle     Objective:     Vitals: BP 110/62 (BP Location: Right Arm, Patient Position: Sitting, Cuff Size: Large)   Pulse 70   Temp 98 F (36.7 C) (Oral)   Resp 14   Ht 5' 2"  (1.575 m)   Wt 279 lb 11.2 oz (126.9 kg)   SpO2 91%   BMI 51.16 kg/m   Body mass index is 51.16 kg/m.  Advanced Directives 06/17/2018 06/01/2018 04/20/2018 08/09/2017 07/01/2017 06/14/2017 04/09/2017  Does Patient Have a Medical Advance Directive? Yes Yes Yes Yes Yes Yes Yes  Type of AParamed[place]of [place]ort[place] will - - [place] will [place] IronLiving will [place]Living will [place] will  Does patient want to make changes to medical advance directive? - - - - - - -  Copy of [place] Chart? Yes - - Yes No - copy requested Yes -  Would patient like information on creating a medical advance directive? Yes (MAU/Ambulatory/Procedural Areas - Information given) - - - - - -    Tobacco Social History   Tobacco Use  Smoking Status Former Smoker  . Packs/day: 2.00  . Years: 15.00  . Pack years: 30.00  . Types: Cigarettes  . Last attempt to quit: 1983  . Years since quitting: 36.6  Smokeless Tobacco Never Used  Tobacco Comment   smoking cessation materials not required     Counseling given: No Comment: smoking cessation materials not required  Clinical Intake:  Pre-visit preparation completed: Yes  Pain : No/denies pain   BMI - recorded: 51.16 Nutritional Status: BMI > 30  Obese Nutritional Risks: None  Nutrition Risk Assessment: Has the patient had any N/V/D within the last  2 months?  No Does the patient have any non-healing wounds?  No Has the patient had any unintentional weight loss or weight gain?  No  Is the patient diabetic?  Yes If diabetic, was a CBG obtained today?  No Did the patient bring in their glucometer from home?  No Comments: Pt monitors CBG's every other day. Denies any financial strains with the device or supplies.  Diabetic Exams: Diabetic Eye Exam: Completed 04/12/18.  Diabetic Foot Exam: Completed 04/11/18.   How often do you need to have someone help you when you read instructions, pamphlets, or other written materials from your doctor or pharmacy?: 1 - Never  Interpreter Needed?: No  Information entered by :: AIdell Pickles LPN  Past Medical History:  Diagnosis Date  . Asthma   . Diabetes mellitus without complication ([place]   . Gout   . Hyperlipidemia   . Hypertension   . Iron deficiency anemia 04/20/2018  . PONV (postoperative nausea and vomiting)   . Shortness of breath dyspnea   . Sleep apnea    Past Surgical History:  Procedure Laterality Date  . APPENDECTOMY    . CHOLECYSTECTOMY  1977  . COLONOSCOPY WITH PROPOFOL N/A 01/21/2016   Procedure: COLONOSCOPY WITH PROPOFOL;  Surgeon: DLucilla Lame MD;  Location: [facility] ENDOSCOPY;  Service: Endoscopy;  Laterality: N/A;  . DILATION AND CURETTAGE OF UTERUS     Due to Amenorrhea  . HEMORRHOID BANDING  1980  . HEMORROIDECTOMY    . HERNIA REPAIR  2011  . Temporal Area Excision Biopsy     For Birth Mark Changes, Negative Pathology  . TONSILLECTOMY AND ADENOIDECTOMY     Family History  Problem Relation Age of Onset  . Cancer Mother        brain tumor  . Kidney disease Mother   . Hypertension Mother   . Heart disease Father   . COPD Father   . Hypertension Sister   . Arthritis Sister   . Cancer Sister   . GER disease Sister   . Lupus Sister   . Diabetes Sister   . Arthritis Sister   . Osteopenia Sister   . Heart disease Sister   . Hypertension Sister   . Breast cancer  Maternal Aunt 70  . Leukemia Maternal Aunt    Social History   Socioeconomic History  . Marital status: Divorced    Spouse name: Not on file  . Number of children: 0  . Years of education: Not on file  . Highest education level: 12th grade  Occupational History  . Occupation: Retired  Scientific laboratory technician  . Financial resource strain: Not hard at all  . Food insecurity:    Worry: Never true    Inability: Never true  . Transportation needs:    Medical: No    Non-medical: No  Tobacco Use  . Smoking status: Former Smoker    Packs/day: 2.00    Years: 15.00    Pack years: 30.00    Types: Cigarettes    Last attempt to quit: 1983    Years since quitting: 36.6  . Smokeless tobacco: Never Used  . Tobacco comment: smoking cessation materials not required  Substance and Sexual Activity  . Alcohol use: Not Currently    Alcohol/week: 0.0 standard drinks  . Drug use: No  . Sexual activity: Not Currently  Lifestyle  . Physical activity:    Days per week: 0 days    Minutes per session: 0 min  . Stress: Not at all  Relationships  . Social connections:    Talks on phone: Patient refused    Gets together: Patient refused    Attends religious service: Patient refused    Active member of club or organization: Patient refused    Attends meetings of clubs or organizations: Patient refused    Relationship status: Divorced  Other Topics Concern  . Not on file  Social History Narrative  . Not on file    Outpatient Encounter Medications as of 06/17/2018  Medication Sig  . acetaminophen (TYLENOL) 500 MG tablet Take 1 tablet (500 mg total) by mouth every 6 (six) hours as needed. (Patient taking differently: Take 1,000 mg by mouth every 6 (six) hours as needed. )  . albuterol (ACCUNEB) 1.25 MG/3ML nebulizer solution Take 3 mLs (1.25 mg total) by nebulization 2 (two) times daily.  [name] Kitchen allopurinol (ZYLOPRIM) 300 MG tablet Take 1 tablet (300 mg total) by mouth daily.  [name] Kitchen aspirin 81 MG tablet Take 1  tablet by mouth daily.  [name] Kitchen atorvastatin (LIPITOR) 40 MG tablet Daily for cholesterol  . benazepril (LOTENSIN) 20 MG tablet Take 1 tablet (20 mg total) by mouth daily.  . Blood Glucose Monitoring Suppl (TRUE METRIX AIR GLUCOSE METER) w/Device KIT 1 each by Does not apply route 1 day or 1 dose.  . budesonide (PULMICORT) 0.25 MG/2ML nebulizer solution Take 2 mLs (0.25 mg total) by nebulization 2 (two) times daily.  . calcipotriene (DOVONOX) 0.005 % cream Apply topically.  . colchicine 0.6 MG  tablet Take 1 tablet by mouth 2 (two) times daily. Takes as needed for flares  . furosemide (LASIX) 40 MG tablet TAKE 1 TABLET 2 TIMES DAILYAS NEEDED  . gabapentin (NEURONTIN) 300 MG capsule TAKE 1 CAPSULE THREE TIMES DAILY  . glucose blood test strip Use as instructed  . Insulin Pen Needle 32G X 6 MM MISC 1 each by Does not apply route 4 (four) times daily.  . Lancets (ONETOUCH ULTRASOFT) lancets Use as instructed  . liraglutide (VICTOZA) 18 MG/3ML SOPN Inject 0.3 mLs (1.8 mg total) into the skin daily.  . metFORMIN (GLUCOPHAGE) 500 MG tablet TAKE 1 TAB IN MORNING AND 2 TABS IN EVENING  . potassium chloride SA (K-DUR,KLOR-CON) 20 MEQ tablet Take 1-2 tablets (20-40 mEq total) by mouth daily.  [name] Kitchen triamcinolone cream (KENALOG) 0.1 % Apply 1 application topically as directed.  . triamcinolone ointment (KENALOG) 0.1 % APPLY A PEA SIZE AMOUNT EXTERNALLY TO AREA EVERY OTHER DAY  . Vitamin D, Cholecalciferol, 1000 UNITS TABS Take 1 capsule by mouth daily.  . diclofenac sodium (VOLTAREN) 1 % GEL Apply 4 g topically 4 (four) times daily. (Patient not taking: Reported on 06/17/2018)   No facility-administered encounter medications on file as of 06/17/2018.     Activities of Daily Living In your present state of health, do you have any difficulty performing the following activities: 06/17/2018 06/15/2018  Hearing? N N  Comment denies hearing aids -  Vision? N Y  Comment wears eyeglasses glasses  Difficulty concentrating  or making decisions? N N  Walking or climbing stairs? Y Y  Comment avoids, obesity, joint pain, dyspnea -  Dressing or bathing? N Y  Doing errands, shopping? N N  Preparing Food and eating ? N -  Comment lower partial dentures -  Using the Toilet? N -  In the past six months, have you accidently leaked urine? N -  Do you have problems with loss of bowel control? N -  Managing your Medications? N -  Managing your Finances? N -  Housekeeping or managing your Housekeeping? N -  Some recent data might be hidden    Patient Care Team: Steele Sizer, MD as PCP - General (Family Medicine) Bryson Ha, OD as Consulting Physician (Optometry) Sharlet Salina, MD as Consulting Physician (Physical Medicine and Rehabilitation) Diamond Nickel, MD as Consulting Physician (Sports Medicine)    Assessment:   This is a routine wellness examination for Amber Avery.  Exercise Activities and Dietary recommendations Current Exercise Habits: The patient does not participate in regular exercise at present, Exercise limited by: None identified  Goals    . DIET - INCREASE WATER INTAKE     Recommend to drink at least 6-8 8oz glasses of water per day.    . Exercise 3x per week (30 min per time)     Recommend to start exercising 3 times a week for 20-30 minutes at a time.        Fall Risk Fall Risk  06/17/2018 05/11/2018 05/04/2018 04/11/2018 12/10/2017  Falls in the past year? No No No No No  Risk for fall due to : Impaired vision;Impaired balance/gait - Impaired mobility - -  Risk for fall due to: Comment wears eyeglasses; ambuates with cane or rolling walker - - - -   FALL RISK PREVENTION PERTAINING TO HOME: Is your home free of loose throw rugs in walkways, pet beds, electrical cords, etc? Yes Is there adequate lighting in your home to reduce risk of falls?  Yes  Are there stairs in or around your home WITH handrails? No stairs. Has a ramp  ASSISTIVE DEVICES UTILIZED TO PREVENT FALLS: Use of a  cane, walker or w/c? Yes, cane and rolling walker Grab bars in the bathroom? Yes  Shower chair or a place to sit while bathing? Yes An elevated toilet seat or a handicapped toilet? Yes  Timed Get Up and Go Performed: Yes. Pt ambulated 10 feet within 34 sec. Gait slow, steady and with the use of an assistive device. No intervention required at this time. Fall risk prevention has been discussed.  Community Resource Referral:  [place] Referral not required at this time.   Depression Screen PHQ 2/9 Scores 06/17/2018 06/15/2018 04/11/2018 07/01/2017  PHQ - 2 Score 0 0 0 0  PHQ- 9 Score 0 - 3 -     Cognitive Function     6CIT Screen 06/17/2018 06/14/2017  What Year? 0 points 0 points  What month? 0 points 0 points  What time? 0 points 0 points  Count back from 20 0 points 0 points  Months in reverse 0 points 4 points  Repeat phrase 2 points 4 points  Total Score 2 8    Immunization History  Administered Date(s) Administered  . Influenza, High Dose Seasonal PF 06/19/2015, 06/29/2016, 06/14/2017, 06/17/2018  . Influenza-Unspecified 06/17/2011  . Pneumococcal Conjugate-13 11/24/2011, 11/23/2013, 04/09/2017  . Pneumococcal Polysaccharide-23 08/30/2009, 08/30/2009  . Td 08/30/2009  . Tdap 08/30/2009  . Zoster 02/12/2011    Qualifies for Shingles Vaccine? Yes. Zostavax completed 02/12/11. Due for Shingrix. Education has been provided regarding the importance of this vaccine. Pt has been advised to call insurance company to determine out of pocket expense. Advised may also receive vaccine at local pharmacy or Health Dept. Verbalized acceptance and understanding.  Screening Tests Health Maintenance  Topic Date Due  . MAMMOGRAM  08/05/2018  . HEMOGLOBIN A1C  10/11/2018  . FOOT EXAM  04/12/2019  . OPHTHALMOLOGY EXAM  04/13/2019  . TETANUS/TDAP  08/31/2019  . COLONOSCOPY  01/20/2026  . INFLUENZA VACCINE  Completed  . DEXA SCAN  Completed  . Hepatitis C Screening  Completed    . PNA vac Low Risk Adult  Completed    Cancer Screenings: Lung: Low Dose CT Chest recommended if Age 82-80 years, 30 pack-year currently smoking OR have quit w/in 15years. Patient does not qualify. Breast:  Up to date on Mammogram? Yes. Completed 08/05/17. Repeat every year   Up to date of Bone Density/Dexa? Yes. Completed 08/05/17. Repeat every 2 years Colorectal: Completed 01/21/16. Repeat every 10 years   Additional Screenings: Hepatitis C Screening: Completed 07/14/12    Plan:  I have personally reviewed and addressed the Medicare Annual Wellness questionnaire and have noted the following in the patient's chart:  A. Medical and social history B. Use of alcohol, tobacco or illicit drugs  C. Current medications and supplements D. Functional ability and status E.  Nutritional status F.  Physical activity G. Advance directives H. List of other physicians I.  Hospitalizations, surgeries, and ER visits in previous 12 months J.  Organ such as hearing and vision if needed, cognitive and depression L. Referrals and appointments  In addition, I have reviewed and discussed with patient certain preventive protocols, quality metrics, and best practice recommendations. A written personalized care plan for preventive services as well as general preventive health recommendations were provided to patient.  See attached scanned questionnaire for additional information.   Signed,  Aleatha Borer, LPN Nurse  Health Advisor  I have reviewed this encounter including the documentation in this note and/or discussed this patient with the provider, Aleatha Borer, LPN. I am certifying that I agree with the content of this note as supervising physician.  Steele Sizer, MD [facility] 06/17/2018, 1:23 PM

## 2018-06-17 NOTE — Telephone Encounter (Signed)
Patient seen Amber Borer, LPN today for her Medicare Wellness and her last Sleep Study was done years ago. Amber Avery wanted to see if Amber Avery would order a new sleep study due to the patient possibility needing a titration level done.

## 2018-06-17 NOTE — Patient Instructions (Signed)
[name] , Thank you for taking time to come for your Medicare Wellness Visit. I appreciate your ongoing commitment to your health goals. Please review the following plan we discussed and let me know if I can assist you in the future.   Screening recommendations/referrals: Colorectal Screening: Up to date Mammogram: Please call to schedule your appointment Bone Density: Up to date  Vision and Dental Exams: Recommended annual ophthalmology exams for early detection of glaucoma and other disorders of the eye Recommended annual dental exams for proper oral hygiene  Diabetic Exams: Recommended annual diabetic eye exams for early detection of retinopathy Recommended annual diabetic foot exams for early detection of peripheral neuropathy.  Diabetic Eye Exam: Up to date Diabetic Foot Exam: Up to date  Vaccinations: Influenza vaccine: Completed today Pneumococcal vaccine: Up to date Tdap vaccine: Up to date Shingles vaccine: Please call your insurance company to determine your out of pocket expense for the Shingrix vaccine. You may also receive this vaccine at your local pharmacy or Health Dept.    Advanced directives: We have received a copy of your POA (Power of [place]) and/or Living Will. These documents can be located in your chart.  Goals: Recommend to drink at least 6-8 8oz glasses of water per day.  Next appointment: Please schedule your Annual Wellness Visit with your Nurse Health Advisor in one year.  Preventive Care 71 Years and Older, Female Preventive care refers to lifestyle choices and visits with your health care provider that can promote health and wellness. What does preventive care include?  A yearly physical exam. This is also called an annual well check.  Dental exams once or twice a year.  Routine eye exams. Ask your health care provider how often you should have your eyes checked.  Personal lifestyle choices, including:  Daily care of your teeth and  gums.  Regular physical activity.  Eating a healthy diet.  Avoiding tobacco and drug use.  Limiting alcohol use.  Practicing safe sex.  Taking low-dose aspirin every day.  Taking vitamin and mineral supplements as recommended by your health care provider. What happens during an annual well check? The services and screenings done by your health care provider during your annual well check will depend on your age, overall health, lifestyle risk factors, and family history of disease. Counseling  Your health care provider may ask you questions about your:  Alcohol use.  Tobacco use.  Drug use.  Emotional well-being.  Home and relationship well-being.  Sexual activity.  Eating habits.  History of falls.  Memory and ability to understand (cognition).  Work and work Statistician.  Reproductive health. Screening  You may have the following tests or measurements:  Height, weight, and BMI.  Blood pressure.  Lipid and cholesterol levels. These may be checked every 5 years, or more frequently if you are over 10 years old.  Skin check.  Lung cancer screening. You may have this screening every year starting at age 71 if you have a 30-pack-year history of smoking and currently smoke or have quit within the past 15 years.  Fecal occult blood test (FOBT) of the stool. You may have this test every year starting at age 71.  Flexible sigmoidoscopy or colonoscopy. You may have a sigmoidoscopy every 5 years or a colonoscopy every 10 years starting at age 71.  Hepatitis C blood test.  Hepatitis B blood test.  Sexually transmitted disease (STD) testing.  Diabetes screening. This is done by checking your blood sugar (glucose) after  you have not eaten for a while (fasting). You may have this done every 1-3 years.  Bone density scan. This is done to screen for osteoporosis. You may have this done starting at age 71.  Mammogram. This may be done every 1-2 years. Talk to your  health care provider about how often you should have regular mammograms. Talk with your health care provider about your test results, treatment options, and if necessary, the need for more tests. Vaccines  Your health care provider may recommend certain vaccines, such as:  Influenza vaccine. This is recommended every year.  Tetanus, diphtheria, and acellular pertussis (Tdap, Td) vaccine. You may need a Td booster every 10 years.  Zoster vaccine. You may need this after age 39.  Pneumococcal 13-valent conjugate (PCV13) vaccine. One dose is recommended after age 71.  Pneumococcal polysaccharide (PPSV23) vaccine. One dose is recommended after age 71. Talk to your health care provider about which screenings and vaccines you need and how often you need them. This information is not intended to replace advice given to you by your health care provider. Make sure you discuss any questions you have with your health care provider. Document Released: 11/01/2015 Document Revised: 06/24/2016 Document Reviewed: 08/06/2015 Elsevier Interactive Patient Education  2017 [employer] [employer] Prevention in the Home Falls can cause injuries. They can happen to people of all ages. There are many things you can do to make your home safe and to help prevent falls. What can I do on the outside of my home?  Regularly fix the edges of walkways and driveways and fix any cracks.  Remove anything that might make you trip as you walk through a door, such as a raised step or threshold.  Trim any bushes or trees on the path to your home.  Use bright outdoor lighting.  Clear any walking paths of anything that might make someone trip, such as rocks or tools.  Regularly check to see if handrails are loose or broken. Make sure that both sides of any steps have handrails.  Any raised decks and porches should have guardrails on the edges.  Have any leaves, snow, or ice cleared regularly.  Use sand or salt on walking  paths during winter.  Clean up any spills in your garage right away. This includes oil or grease spills. What can I do in the bathroom?  Use night lights.  Install grab bars by the toilet and in the tub and shower. Do not use towel bars as grab bars.  Use non-skid mats or decals in the tub or shower.  If you need to sit down in the shower, use a plastic, non-slip stool.  Keep the floor dry. Clean up any water that spills on the floor as soon as it happens.  Remove soap buildup in the tub or shower regularly.  Attach bath mats securely with double-sided non-slip rug tape.  Do not have throw rugs and other things on the floor that can make you trip. What can I do in the bedroom?  Use night lights.  Make sure that you have a light by your bed that is easy to reach.  Do not use any sheets or blankets that are too big for your bed. They should not hang down onto the floor.  Have a firm chair that has side arms. You can use this for support while you get dressed.  Do not have throw rugs and other things on the floor that can make you trip.  What can I do in the kitchen?  Clean up any spills right away.  Avoid walking on wet floors.  Keep items that you use a lot in easy-to-reach places.  If you need to reach something above you, use a strong step stool that has a grab bar.  Keep electrical cords out of the way.  Do not use floor polish or wax that makes floors slippery. If you must use wax, use non-skid floor wax.  Do not have throw rugs and other things on the floor that can make you trip. What can I do with my stairs?  Do not leave any items on the stairs.  Make sure that there are handrails on both sides of the stairs and use them. Fix handrails that are broken or loose. Make sure that handrails are as long as the stairways.  Check any carpeting to make sure that it is firmly attached to the stairs. Fix any carpet that is loose or worn.  Avoid having throw rugs at  the top or bottom of the stairs. If you do have throw rugs, attach them to the floor with carpet tape.  Make sure that you have a light switch at the top of the stairs and the bottom of the stairs. If you do not have them, ask someone to add them for you. What else can I do to help prevent falls?  Wear shoes that:  Do not have high heels.  Have rubber bottoms.  Are comfortable and fit you well.  Are closed at the toe. Do not wear sandals.  If you use a stepladder:  Make sure that it is fully opened. Do not climb a closed stepladder.  Make sure that both sides of the stepladder are locked into place.  Ask someone to hold it for you, if possible.  Clearly mark and make sure that you can see:  Any grab bars or handrails.  First and last steps.  Where the edge of each step is.  Use tools that help you move around (mobility aids) if they are needed. These include:  Canes.  Walkers.  Scooters.  Crutches.  Turn on the lights when you go into a dark area. Replace any light bulbs as soon as they burn out.  Set up your furniture so you have a clear path. Avoid moving your furniture around.  If any of your floors are uneven, fix them.  If there are any pets around you, be aware of where they are.  Review your medicines with your doctor. Some medicines can make you feel dizzy. This can increase your chance of falling. Ask your doctor what other things that you can do to help prevent falls. This information is not intended to replace advice given to you by your health care provider. Make sure you discuss any questions you have with your health care provider. Document Released: 08/01/2009 Document Revised: 03/12/2016 Document Reviewed: 11/09/2014 Elsevier Interactive Patient Education  2017 [employer].

## 2018-06-22 ENCOUNTER — Telehealth: Payer: Self-pay | Admitting: Family Medicine

## 2018-06-22 NOTE — Telephone Encounter (Signed)
Copied from [place] [id number]. Topic: Quick Communication - See Telephone Encounter >> Jun 22, 2018  1:45 PM Conception Chancy, NT wrote: CRM for notification. See Telephone encounter for: 06/22/18.  Patient is calling and states she is trying to get her oxygen supplier changed. She is currently using [facility] and is wanting to change it to [employer]. She states Huey Romans is supposed to pick up her machine on Monday 06/27/18 but she does not have it set up for [employer] to bring the machine out. She states [employer] told her that her provider will need to contact the company [employer], medical) and let them know why she needs it and send a new prescription. She is also needing to change her Cpap machine supplies to [employer]. Please advise.   [employer] # [phone] Fax# [phone]

## 2018-06-23 ENCOUNTER — Ambulatory Visit: Payer: Self-pay | Admitting: Pharmacist

## 2018-06-23 DIAGNOSIS — J4530 Mild persistent asthma, uncomplicated: Secondary | ICD-10-CM

## 2018-06-23 DIAGNOSIS — D509 Iron deficiency anemia, unspecified: Secondary | ICD-10-CM

## 2018-06-23 DIAGNOSIS — G4733 Obstructive sleep apnea (adult) (pediatric): Secondary | ICD-10-CM

## 2018-06-23 DIAGNOSIS — N182 Chronic kidney disease, stage 2 (mild): Secondary | ICD-10-CM

## 2018-06-23 DIAGNOSIS — I10 Essential (primary) hypertension: Secondary | ICD-10-CM

## 2018-06-23 DIAGNOSIS — E785 Hyperlipidemia, unspecified: Secondary | ICD-10-CM

## 2018-06-23 DIAGNOSIS — E1129 Type 2 diabetes mellitus with other diabetic kidney complication: Secondary | ICD-10-CM

## 2018-06-23 DIAGNOSIS — E1122 Type 2 diabetes mellitus with diabetic chronic kidney disease: Secondary | ICD-10-CM

## 2018-06-23 DIAGNOSIS — M170 Bilateral primary osteoarthritis of knee: Secondary | ICD-10-CM

## 2018-06-23 DIAGNOSIS — R809 Proteinuria, unspecified: Secondary | ICD-10-CM

## 2018-06-23 NOTE — Telephone Encounter (Signed)
Please contact [employer] and fax prescription of all she needs. Thank you

## 2018-06-23 NOTE — Chronic Care Management (AMB) (Signed)
 Chronic Care Management   Initial Visit Note  06/23/2018 Name: TNIYA LEPRE MRN: [id number] DOB: [birth year]  Referred by: Alba Cory, MD Reason for referral : Chronic Care Management (Victoza MAP, oxygen)  Subjective: "I need help affording my Victoza"  Objective: Medications Reviewed Today    Reviewed by Andee Poles, [facility] (Pharmacist) on 06/23/18 at 1300  Med List Status: <None>  Medication Order Taking? Sig Documenting Provider Last Dose Status Informant  acetaminophen (TYLENOL) 500 MG tablet [id number]  Take 1 tablet (500 mg total) by mouth every 6 (six) hours as needed.  Patient taking differently:  Take 1,000 mg by mouth every 6 (six) hours as needed.    Alba Cory, MD  Active   albuterol (ACCUNEB) 1.25 MG/3ML nebulizer solution [id number] No Take 3 mLs (1.25 mg total) by nebulization 2 (two) times daily.  Patient not taking:  Reported on 06/23/2018   Alba Cory, MD Not Taking Active   allopurinol (ZYLOPRIM) 300 MG tablet [id number] Yes Take 1 tablet (300 mg total) by mouth daily. Alba Cory, MD Taking Active   aspirin 81 MG tablet [id number] Yes Take 1 tablet by mouth daily. [provider] Taking Active            Med Note Burnett Sheng, Truddie Coco Jun 23, 2018 12:56 PM) Patient had been taking ASA 325mg  in pill packs. Contacted pharmacist, she will be switched to ASA 81mg  06/26/18  atorvastatin (LIPITOR) 40 MG tablet [id number] Yes Daily for cholesterol Alba Cory, MD Taking Active   benazepril (LOTENSIN) 20 MG tablet [id number] Yes Take 1 tablet (20 mg total) by mouth daily. Alba Cory, MD Taking Active   Blood Glucose Monitoring Suppl (TRUE METRIX AIR GLUCOSE METER) w/Device KIT [id number] Yes 1 each by Does not apply route 1 day or 1 dose. Alba Cory, MD Taking Active   budesonide (PULMICORT) 0.25 MG/2ML nebulizer solution [id number] No Take 2 mLs (0.25 mg total) by nebulization 2 (two) times daily.  Patient not taking:  Reported on 06/23/2018     Alba Cory, MD Not Taking Active   colchicine 0.6 MG tablet [id number] No Take 1 tablet by mouth 2 (two) times daily. Takes as needed for flares [provider] Not Taking Active Self           Med Note Luiz Blare, Valetta Close   Thu Apr 04, 2015  8:37 AM) Received from: [facility] Received Sig: Take by mouth.  diclofenac sodium (VOLTAREN) 1 % GEL [id number] No Apply 4 g topically 4 (four) times daily.  Patient not taking:  Reported on 06/17/2018   Alba Cory, MD Not Taking Active   furosemide (LASIX) 40 MG tablet [id number] Yes TAKE 1 TABLET 2 TIMES DAILYAS NEEDED Sowles, Danna Hefty, MD Taking Active   gabapentin (NEURONTIN) 300 MG capsule [id number] Yes TAKE 1 CAPSULE THREE TIMES DAILY Alba Cory, MD Taking Active   glucose blood test strip [id number] Yes Use as instructed Alba Cory, MD Taking Active   Insulin Pen Needle 32G X 6 MM MISC [id number] Yes 1 each by Does not apply route 4 (four) times daily. Alba Cory, MD Taking Active   Lancets [facility] ULTRASOFT) lancets [id number] Yes Use as instructed Alba Cory, MD Taking Active   liraglutide (VICTOZA) 18 MG/3ML SOPN [id number] Yes Inject 0.3 mLs (1.8 mg total) into the skin daily. Alba Cory, MD Taking Active            Med Note Evelene Croon, Nena Polio Apr 20, 2018 11:20 AM) Currently on 1.2 mg daily  metFORMIN (GLUCOPHAGE) 500 MG tablet [id number] Yes TAKE 1 TAB IN MORNING AND 2 TABS IN Bernell List, MD Taking Active            Med Note Burnett Sheng, Johm Pfannenstiel E   Thu Jun 23, 2018 12:59 PM) Taking one tablet at night.   potassium chloride SA (K-DUR,KLOR-CON) 20 MEQ tablet [id number] Yes Take 1-2 tablets (20-40 mEq total) by mouth daily. Alba Cory, MD Taking Active   triamcinolone cream (KENALOG) 0.1 % [id number] Yes Apply 1 application topically as directed. [provider] Taking Active   triamcinolone ointment (KENALOG) 0.1 % [id number] Yes APPLY A PEA SIZE AMOUNT EXTERNALLY TO  AREA EVERY OTHER Mliss Fritz, MD Taking Active   Vitamin D, Cholecalciferol, 1000 UNITS TABS [id number] Yes Take 1 capsule by mouth daily. [provider] Taking Active            Med Note Luiz Blare, Charlann Lange Apr 04, 2015  8:37 AM) Received from: External Pharmacy Received Sig:          Lab Results  Component Value Date   HGBA1C 6.5 (A) 04/11/2018   Medication Assistance:  Patient is interested in applying for medication assistance through manufacturer programs for the following medications: Victoza - made by [employer] and prescribed by Dr. Alba Cory. Patient must have spent $1000 out of pocket on prescriptions year to date and be within 400% of [employer].  Clinical Goals: Patient is to have adherence to diabetes medications to meet quality metrics.   Patient Goals:  Goals Addressed            This Visit's Progress   . Patient Stated       "I want to be able to afford Victoza. The copay is expensive"      Plan: Facilitate application to [employer]. Reviewed necessary documents with patient.   Medication Adherence: Per dispense report, patient is not taking furosemide or metformin regularly.Has not filled either of these medications in the past 6 months. Patient does not use albuterol or budesonide nebulizer solutions. All other medications show per dispense report and per patient good adherence. She gets 90 days supply. Uses pill packaging with [facility] [employer]. On Friday, noted that aspirin in pill packs was 325mg  and not 81mg . Patient is appropriately on statin(atorvastatin) and ACEi (benazepril).   Clinical Goals: Patient will be adherent to all oral and injected medications.   Plan: Provide counseling to patient. Called [facility] to discuss aspirin in pill packages- change to 81mg  at time of next pill packaging (06/26/18). Address any barriers to metformin and furosemide with patient.    Ms. Bauknight was given information about  Chronic Care Management services today including:  1. CCM service includes personalized support from designated clinical staff supervised by her physician, including individualized plan of care and coordination with other care providers 2. 24/7 contact phone numbers for assistance for urgent and routine care needs. 3. Service will only be billed when office clinical staff spend 20 minutes or more in a month to coordinate care. 4. Only one practitioner may furnish and bill the service in a calendar month. 5. The patient may stop CCM services at any time (effective at the end of the month) by phone call to the office staff. 6. The patient will be responsible for cost sharing (co-pay) of up to 20% of the service fee (after annual deductible is met).  Patient agreed to services  and verbal consent obtained.  Karalee Height, PharmD Clinical Pharmacist [facility] [phone]

## 2018-06-23 NOTE — Telephone Encounter (Signed)
Tiffany please call patient and explain to her that she will have to stay with [employer] until the 5th year after that she can change , until than it will not be covered by her medicare insurance.  Almyra Free will not be able to change that.  Thank you

## 2018-06-23 NOTE — Telephone Encounter (Signed)
Spoke with [employer] but states until they still have not receive her records from [place] they are unable to accept her until then. They are still waiting on her CMN from [place] to see when her initial start date was with [employer]. [employer] informed me Medicare pays for 3 years and then it goes into a cap of non-coverage. After 3 years they go into a 2 year of non payment from Medicare "black hole" where all the company receives is [employer]. So during these 2 years no other company will accept them until they hit the 5 year mark. P

## 2018-06-24 ENCOUNTER — Ambulatory Visit: Payer: Self-pay | Admitting: *Deleted

## 2018-06-24 DIAGNOSIS — E1122 Type 2 diabetes mellitus with diabetic chronic kidney disease: Secondary | ICD-10-CM

## 2018-06-24 DIAGNOSIS — G4733 Obstructive sleep apnea (adult) (pediatric): Secondary | ICD-10-CM

## 2018-06-24 DIAGNOSIS — N182 Chronic kidney disease, stage 2 (mild): Secondary | ICD-10-CM

## 2018-06-24 NOTE — Progress Notes (Signed)
  Chronic Care Management   Telephone Note   06/24/2018 Name: Amber Avery MRN: [id number] DOB: May 07, 1946  Referred by: Steele Sizer, MD Reason for referral : Chronic Care Management (DME Care Coordination)  Subjective: "I need help getting everything figured out about my oxygen and CPAP"  Objective: Pulse oximetry on room air is 96% on 04/20/18 Documentation of most recent orders for O2/CPAP on file as of 08/05/16    Assessment: Amber Avery is a 72 year old female primary care patient of Dr. Steele Sizer. Amber Avery has a medical history which includes dyslipidemia, asthma, DMII, CKD, and OSA.   Amber Avery was referred to the [facility] Program for assistance with chronic disease management, medication management, and DME care coordination needs.   DME Care Coordination Needs - Amber Avery was considering changing her DME company for CPAP/O2. However she found out that there were coverage ramifications involved and she has since chosen to stay with [employer] for her DME.   Goals Addressed            This Visit's Progress   . "I need to make sure I am keeping my oxygen and CPAP" (pt-stated)              Clinical Goals: Patient will contact CCM team if further needs arise concerning DME.  Interventions: I reached out to [place] and spoke with a representative in the billing department to confirm information Amber Avery inquired about and returned a call to her to provide the information.   I offered to provide Amber Avery with support and assistance regarding the long term management of her chronic diseases but she declined. She did express her gratitude for the assistance being provided by our [facility] Pharmacist.   Plan: CCM Pharmacist remains engaged with patient re: medication assistance needs. CCM Nurse available for chronic care needs.   [facility] [place] Management  [phone]

## 2018-06-24 NOTE — Telephone Encounter (Signed)
I am on the phone with Huey Romans and Holland Falling today working out the details of this case. I will call Ms. Ulrey now to let her know where we are.

## 2018-06-24 NOTE — Patient Instructions (Signed)
Please contact the CCM Team for assistance with your Chronic Care, Medication, or equipment needs.   Janalyn Shy MHA,BSN,RN,CCM Nurse Care Coordinator  [phone]   Ruben Reason PharmD  Clinical Pharmacist  [phone]

## 2018-06-27 IMAGING — MG MM DIGITAL SCREENING BILAT W/ CAD
4 series · 4 of 4 positions shown · non-contrast
Comparison: Previous exam(s).

CLINICAL DATA: Screening.

EXAM:
DIGITAL SCREENING BILATERAL MAMMOGRAM WITH CAD

[R CC]
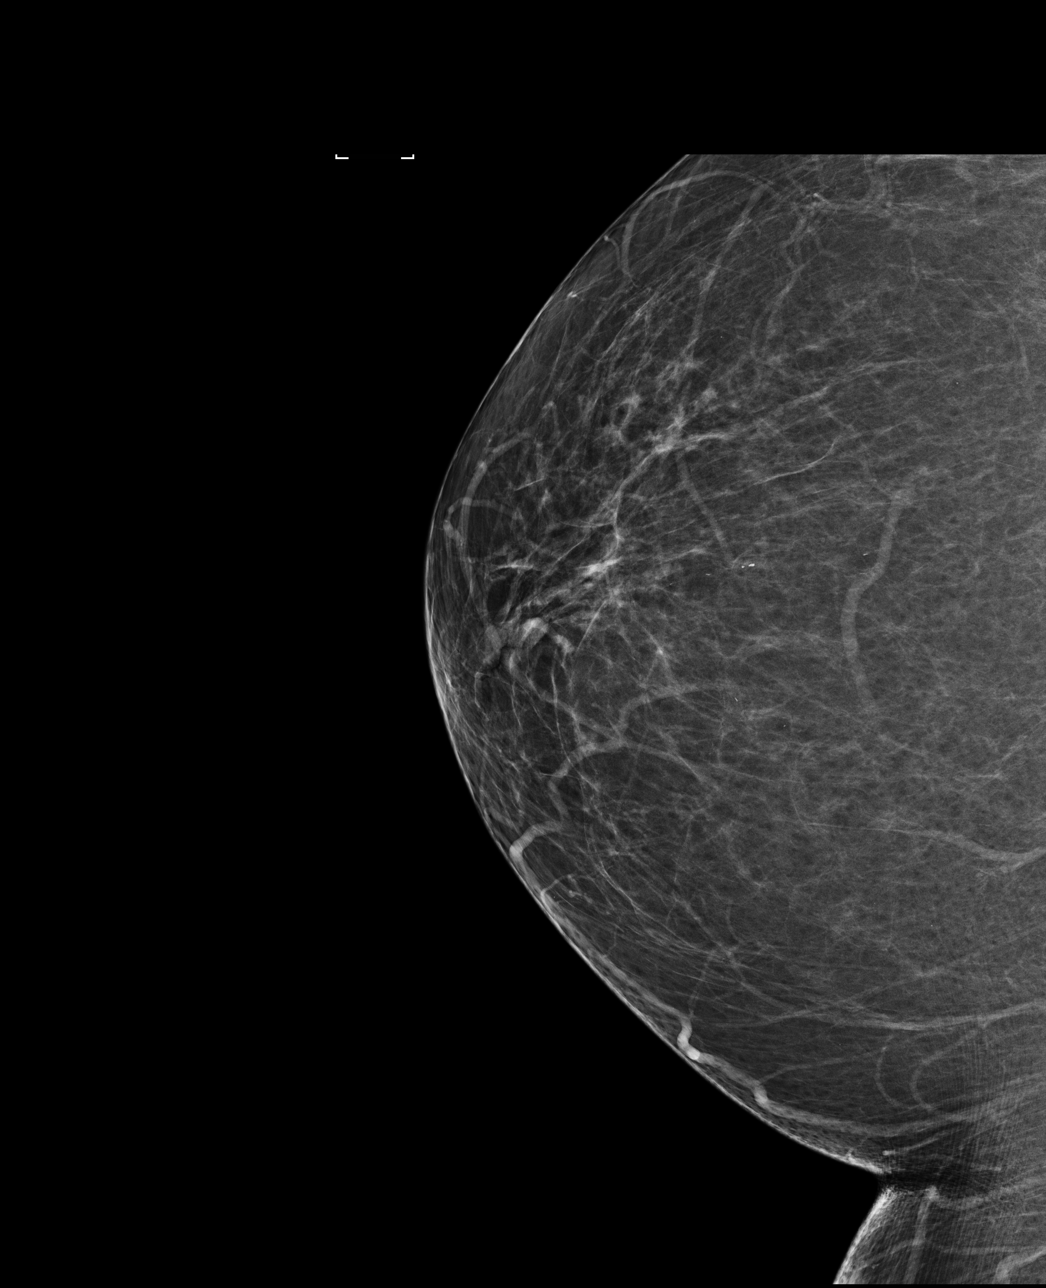

[L MLO]
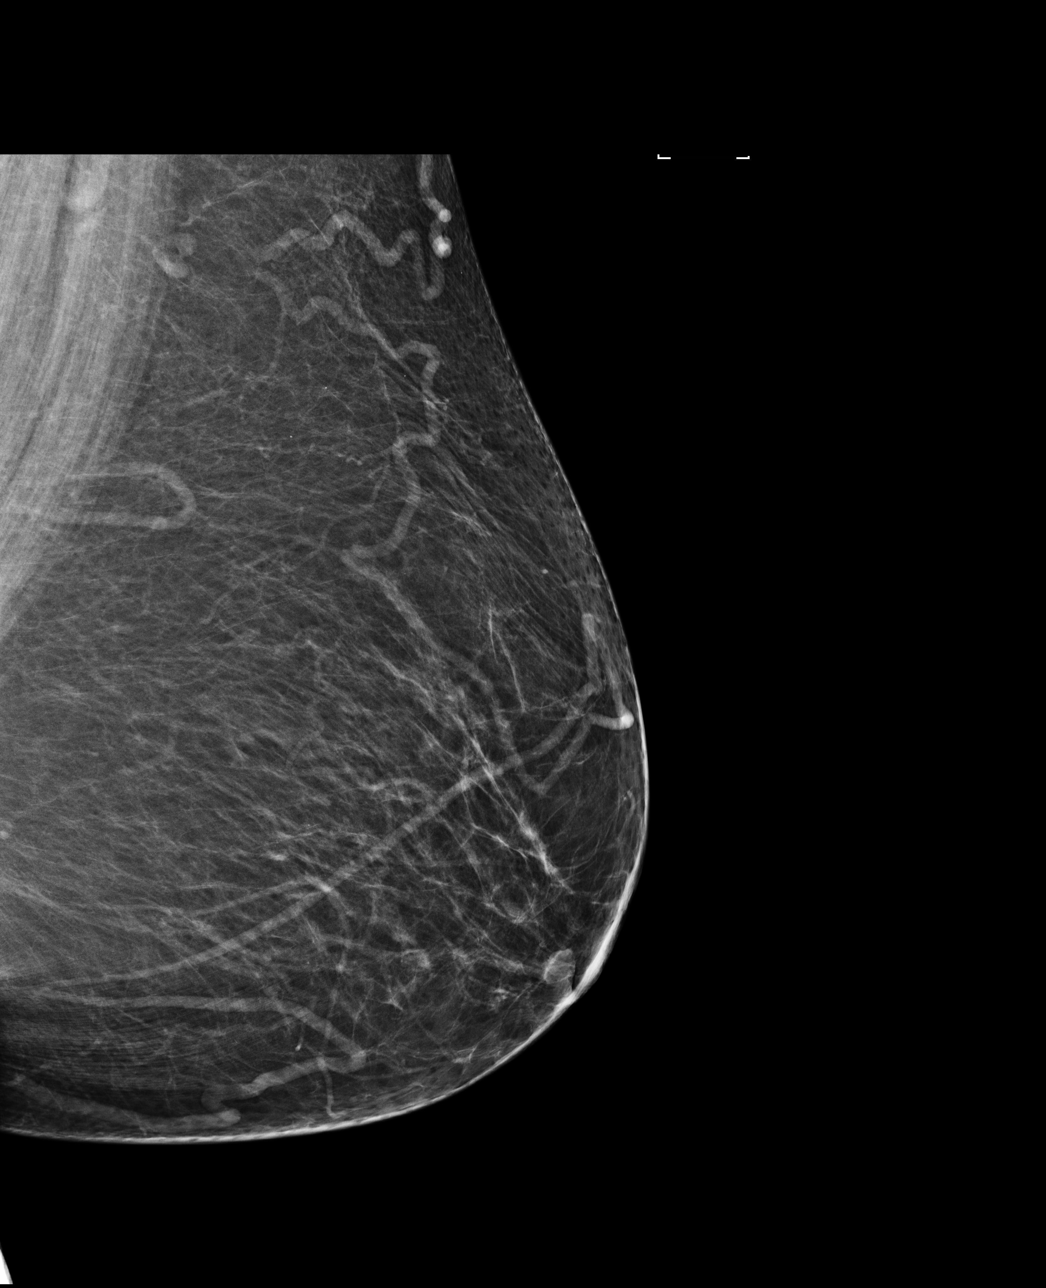

[R MLO]
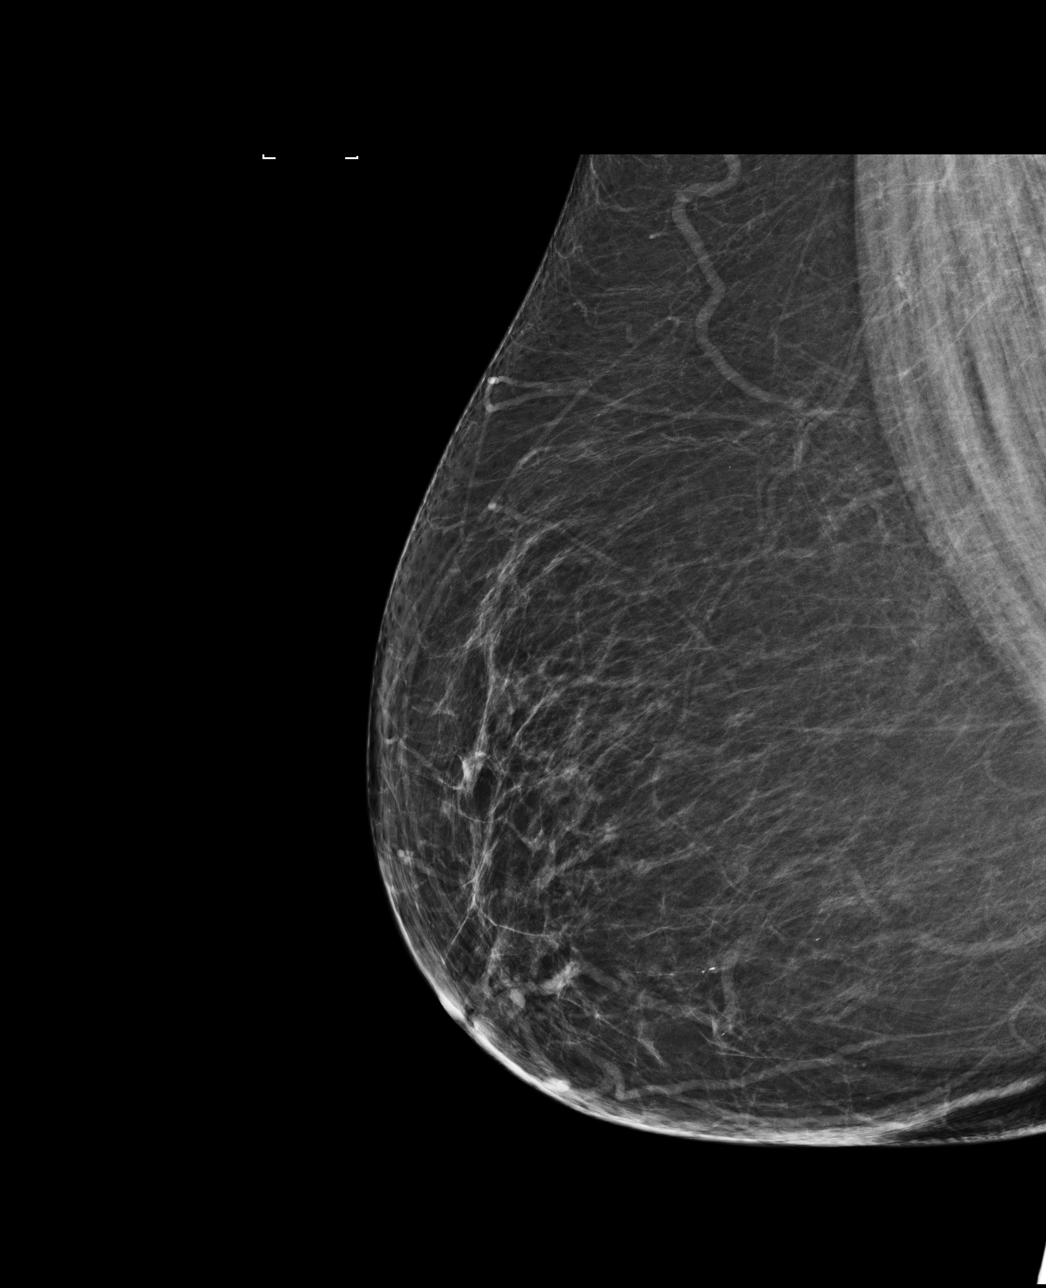

[L CC]
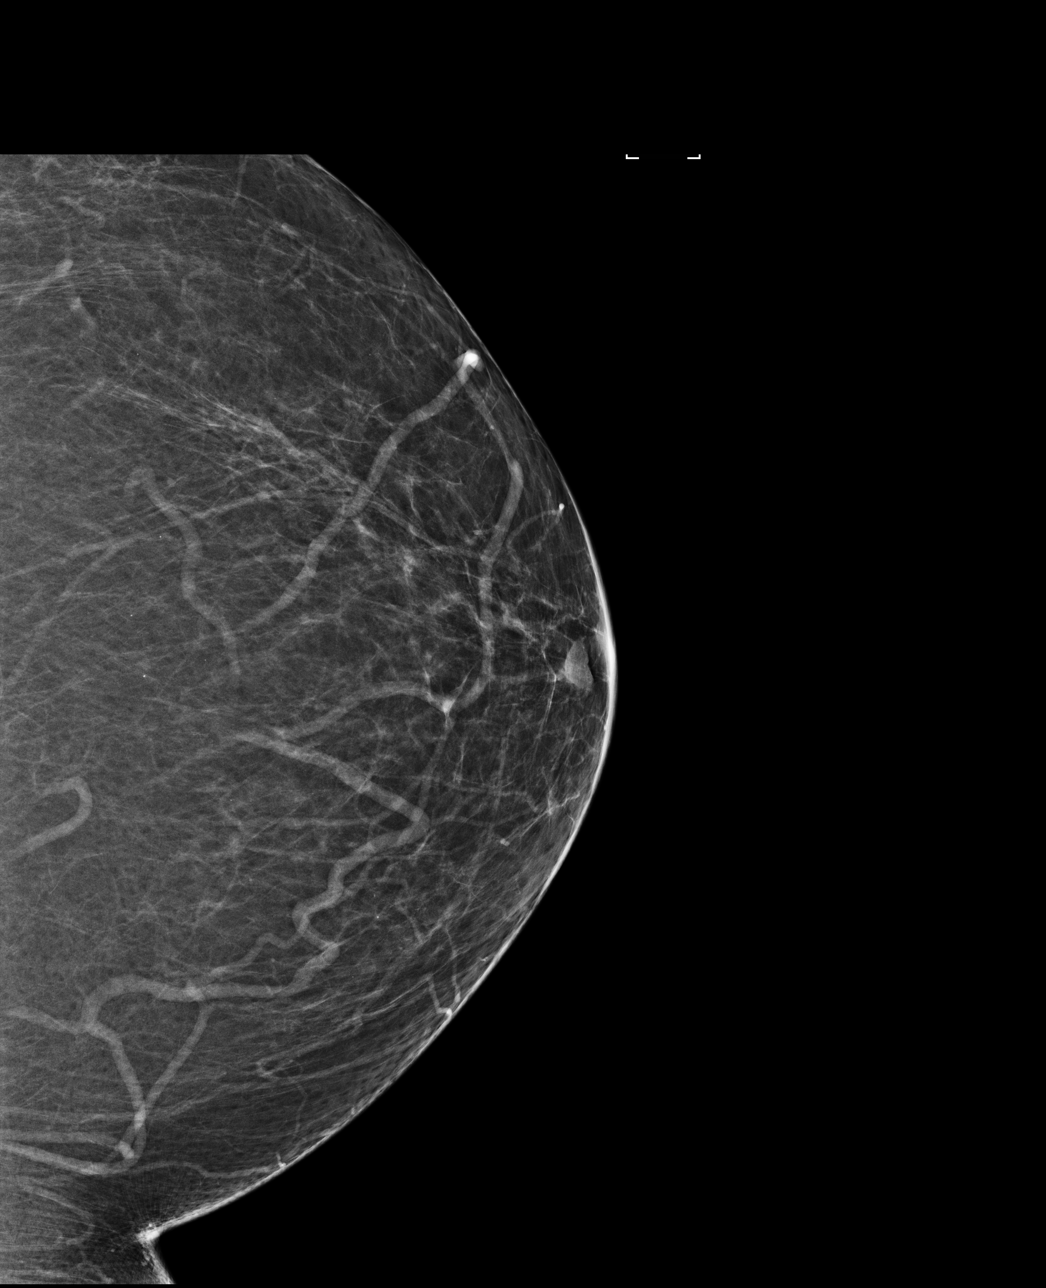

[4 of 4 positions shown; findings below may reference images not displayed]

ACR Breast Density Category b: There are scattered areas of
fibroglandular density.
FINDINGS: There are no findings suspicious for malignancy. Images were
processed with CAD.
IMPRESSION: No mammographic evidence of malignancy. A result letter of this
screening mammogram will be mailed directly to the patient.

RECOMMENDATION:
Screening mammogram in one year. (Code:AS-G-LCT)

BI-RADS CATEGORY  1: Negative.

## 2018-07-04 ENCOUNTER — Telehealth: Payer: Self-pay

## 2018-07-04 DIAGNOSIS — M170 Bilateral primary osteoarthritis of knee: Secondary | ICD-10-CM

## 2018-07-04 MED ORDER — DICLOFENAC SODIUM 1 % TD GEL: 4.0000 g | Freq: Four times a day (QID) | TRANSDERMAL | 1 refills | Status: DC

## 2018-07-04 NOTE — Telephone Encounter (Signed)
Refill request for general medication. Voltaren Gel   Last office visit  06/15/2018   Follow up on 08/08/2018

## 2018-07-05 MED ORDER — DICLOFENAC SODIUM 1 % TD GEL: 4.0000 g | Freq: Four times a day (QID) | TRANSDERMAL | 1 refills | Status: DC

## 2018-07-05 NOTE — Telephone Encounter (Signed)
Pharmacy needs you to call and let them know what area this is to be applied, to send in a new Rx with these directions diclofenac sodium (VOLTAREN) 1 % [place], [place]

## 2018-07-05 NOTE — Addendum Note (Signed)
Addended by: Steele Sizer F on: 07/05/2018 09:54 AM   Modules accepted: Orders

## 2018-07-11 DIAGNOSIS — M47817 Spondylosis without myelopathy or radiculopathy, lumbosacral region: Secondary | ICD-10-CM | POA: Diagnosis not present

## 2018-07-11 DIAGNOSIS — R600 Localized edema: Secondary | ICD-10-CM | POA: Diagnosis not present

## 2018-07-11 DIAGNOSIS — M1990 Unspecified osteoarthritis, unspecified site: Secondary | ICD-10-CM | POA: Diagnosis not present

## 2018-07-11 DIAGNOSIS — M5416 Radiculopathy, lumbar region: Secondary | ICD-10-CM | POA: Diagnosis not present

## 2018-07-11 DIAGNOSIS — N182 Chronic kidney disease, stage 2 (mild): Secondary | ICD-10-CM | POA: Diagnosis not present

## 2018-07-11 DIAGNOSIS — I2510 Atherosclerotic heart disease of native coronary artery without angina pectoris: Secondary | ICD-10-CM | POA: Diagnosis not present

## 2018-07-11 DIAGNOSIS — M170 Bilateral primary osteoarthritis of knee: Secondary | ICD-10-CM | POA: Diagnosis not present

## 2018-07-12 ENCOUNTER — Ambulatory Visit (INDEPENDENT_AMBULATORY_CARE_PROVIDER_SITE_OTHER): Payer: Medicare HMO | Admitting: *Deleted

## 2018-07-12 DIAGNOSIS — J4540 Moderate persistent asthma, uncomplicated: Secondary | ICD-10-CM | POA: Diagnosis not present

## 2018-07-12 DIAGNOSIS — G4733 Obstructive sleep apnea (adult) (pediatric): Secondary | ICD-10-CM

## 2018-07-12 NOTE — Chronic Care Management (AMB) (Signed)
  Chronic Care Management   Follow Up Note   07/12/2018 Name: Amber Avery MRN: [id number] DOB: 1946-08-04  Referred by: Steele Sizer, MD Reason for referral : Care Coordination (f/u O2, CPAP)  Subjective:   Assessment: Mrs. Rotundo is a 72 year old female primary care patient of Dr. Steele Sizer. Mrs. Bouwens has a medical history which includes dyslipidemia, asthma, DMII, CKD, and OSA.   Mrs. Dearcos was referred to the [facility] Program for assistance with chronic disease management, medication management, and DME care coordination needs.    I reached out to Mrs. Merkel today to follow up on her DME needs.  Goals Addressed    . "I need to make sure I am keeping my oxygen and CPAP" (pt-stated)         Clinical Goals: Patient will contact CCM team if further needs arise concerning DME.  Interventions: Telephone follow up to ensure that patient has all needed DME.    Plan: patient will reach out to CCM team with further DME or disease management, pharmacy, or care coordination needs.    [facility] / [place] Management  [phone]

## 2018-07-12 NOTE — Patient Instructions (Signed)
Call a member of CCM (Chronic Care Management) Team with any questions/needs  Janalyn Shy [facility] Nurse Care Coordinator  [phone]   Ruben Reason PharmD  Clinical Pharmacist  [phone]

## 2018-07-14 ENCOUNTER — Ambulatory Visit: Payer: Medicare HMO | Admitting: Gastroenterology

## 2018-07-14 ENCOUNTER — Encounter: Payer: Self-pay | Admitting: Gastroenterology

## 2018-07-14 VITALS — BP 148/71 | HR 85 | Ht 62.0 in | Wt 277.0 lb

## 2018-07-14 DIAGNOSIS — D509 Iron deficiency anemia, unspecified: Secondary | ICD-10-CM

## 2018-07-15 LAB — CBC WITH DIFFERENTIAL/PLATELET
Basophils Absolute: 0.1 10*3/uL (ref 0.0–0.2)
Basos: 1 %
EOS (ABSOLUTE): 0.4 10*3/uL (ref 0.0–0.4)
Eos: 5 %
Hematocrit: 33.2 % — ABNORMAL LOW (ref 34.0–46.6)
Hemoglobin: 11.3 g/dL (ref 11.1–15.9)
Immature Grans (Abs): 0.1 10*3/uL (ref 0.0–0.1)
Immature Granulocytes: 1 %
Lymphocytes Absolute: 1.8 10*3/uL (ref 0.7–3.1)
Lymphs: 24 %
MCH: 31.3 pg (ref 26.6–33.0)
MCHC: 34 g/dL (ref 31.5–35.7)
MCV: 92 fL (ref 79–97)
Monocytes Absolute: 0.9 10*3/uL (ref 0.1–0.9)
Monocytes: 12 %
Neutrophils Absolute: 4.3 10*3/uL (ref 1.4–7.0)
Neutrophils: 57 %
Platelets: 218 10*3/uL (ref 150–450)
RBC: 3.61 x10E6/uL — ABNORMAL LOW (ref 3.77–5.28)
RDW: 16.1 % — ABNORMAL HIGH (ref 12.3–15.4)
WBC: 7.5 10*3/uL (ref 3.4–10.8)

## 2018-07-15 LAB — IRON AND TIBC
Iron Saturation: 10 % — ABNORMAL LOW (ref 15–55)
Iron: 35 ug/dL (ref 27–139)
Total Iron Binding Capacity: 361 ug/dL (ref 250–450)
UIBC: 326 ug/dL (ref 118–369)

## 2018-07-15 LAB — FERRITIN: Ferritin: 78 ng/mL (ref 15–150)

## 2018-07-17 DIAGNOSIS — G4733 Obstructive sleep apnea (adult) (pediatric): Secondary | ICD-10-CM | POA: Diagnosis not present

## 2018-07-18 NOTE — Progress Notes (Signed)
Primary Care Physician: Steele Sizer, MD  Primary Gastroenterologist:  Dr. Lucilla Lame  Chief Complaint  Patient presents with  . Anemia    HPI: Amber Avery is a 72 y.o. female here for follow-up of her anemia.  The patient had an EGD and colonoscopy without any source of the anemia found.  The patient has continued on iron supplementation with infusions.  The patient's iron studies and hemoglobin have greatly improved on iron.  The patient denies any symptoms at the present time.  Current Outpatient Medications  Medication Sig Dispense Refill  . acetaminophen (TYLENOL) 500 MG tablet Take 1 tablet (500 mg total) by mouth every 6 (six) hours as needed. (Patient taking differently: Take 1,000 mg by mouth every 6 (six) hours as needed. ) 480 tablet 0  . albuterol (ACCUNEB) 1.25 MG/3ML nebulizer solution Take 3 mLs (1.25 mg total) by nebulization 2 (two) times daily. 75 mL 2  . allopurinol (ZYLOPRIM) 300 MG tablet Take 1 tablet (300 mg total) by mouth daily. 90 tablet 3  . aspirin 81 MG tablet Take 1 tablet by mouth daily.    [name] Kitchen atorvastatin (LIPITOR) 40 MG tablet Daily for cholesterol 90 tablet 3  . benazepril (LOTENSIN) 20 MG tablet Take 1 tablet (20 mg total) by mouth daily. 90 tablet 3  . Blood Glucose Monitoring Suppl (TRUE METRIX AIR GLUCOSE METER) w/Device KIT 1 each by Does not apply route 1 day or 1 dose. 1 kit 0  . budesonide (PULMICORT) 0.25 MG/2ML nebulizer solution Take 2 mLs (0.25 mg total) by nebulization 2 (two) times daily. 60 mL 2  . colchicine 0.6 MG tablet Take 1 tablet by mouth 2 (two) times daily. Takes as needed for flares    . diclofenac sodium (VOLTAREN) 1 % GEL Apply 4 g topically 4 (four) times daily. On both knees for OA 100 g 1  . furosemide (LASIX) 40 MG tablet TAKE 1 TABLET 2 TIMES DAILYAS NEEDED 90 tablet 3  . gabapentin (NEURONTIN) 300 MG capsule TAKE 1 CAPSULE THREE TIMES DAILY 270 capsule 3  . glucose blood test strip Use as instructed 100 each 12    . Insulin Pen Needle 32G X 6 MM MISC 1 each by Does not apply route 4 (four) times daily. 200 each 2  . Lancets (ONETOUCH ULTRASOFT) lancets Use as instructed 100 each 12  . liraglutide (VICTOZA) 18 MG/3ML SOPN Inject 0.3 mLs (1.8 mg total) into the skin daily. 27 mL 1  . metFORMIN (GLUCOPHAGE) 500 MG tablet TAKE 1 TAB IN MORNING AND 2 TABS IN EVENING 270 tablet 3  . potassium chloride SA (K-DUR,KLOR-CON) 20 MEQ tablet Take 1-2 tablets (20-40 mEq total) by mouth daily. 180 tablet 3  . triamcinolone cream (KENALOG) 0.1 % Apply 1 application topically as directed.    . triamcinolone ointment (KENALOG) 0.1 % APPLY A PEA SIZE AMOUNT EXTERNALLY TO AREA EVERY OTHER DAY 80 g 1  . Vitamin D, Cholecalciferol, 1000 UNITS TABS Take 1 capsule by mouth daily.     No current facility-administered medications for this visit.     Allergies as of 07/14/2018 - Review Complete 07/14/2018  Allergen Reaction Noted  . Codeine  02/22/2015    ROS:  General: Negative for anorexia, weight loss, fever, chills, fatigue, weakness. ENT: Negative for hoarseness, difficulty swallowing , nasal congestion. CV: Negative for chest pain, angina, palpitations, dyspnea on exertion, peripheral edema.  Respiratory: Negative for dyspnea at rest, dyspnea on exertion, cough, sputum, wheezing.  GI:  See history of present illness. GU:  Negative for dysuria, hematuria, urinary incontinence, urinary frequency, nocturnal urination.  Endo: Negative for unusual weight change.    Physical Examination:   BP (!) 148/71   Pulse 85   Ht _0  (1.575 m)   Wt 277 lb (125.6 kg)   BMI 50.66 kg/m   General: Well-nourished, well-developed in no acute distress.  Eyes: No icterus. Conjunctivae pink. Mouth: Oropharyngeal mucosa moist and pink , no lesions erythema or exudate. Lungs: Clear to auscultation bilaterally. Non-labored. Heart: Regular rate and rhythm, no murmurs rubs or gallops.  Abdomen: Bowel sounds are normal, nontender,  nondistended, no hepatosplenomegaly or masses, no abdominal bruits or hernia , no rebound or guarding.   Extremities: No lower extremity edema. No clubbing or deformities. Neuro: Alert and oriented x 3.  Grossly intact. Skin: Warm and dry, no jaundice.   Psych: Alert and cooperative, normal mood and affect.  Labs:    Imaging Studies: No results found.  Assessment and Plan:   Amber Avery is a 72 y.o. y/o female who has a history of iron deficiency anemia with a EGD and colonoscopy not showing any source of her anemia.  The patient has responded well to iron infusions and her iron studies have come back to normal.  The patient will have her labs checked today.  If her iron levels or hemoglobin appear to be going down and the patient should be set up for a capsule endoscopy.  If the iron studies and hemoglobin are stable then we will just continue to monitor.    Lucilla Lame, MD. [name]   Note: This dictation was prepared with Dragon dictation along with smaller phrase technology. Any transcriptional errors that result from this process are unintentional.

## 2018-07-20 ENCOUNTER — Other Ambulatory Visit: Payer: Self-pay

## 2018-07-20 ENCOUNTER — Telehealth: Payer: Self-pay

## 2018-07-20 DIAGNOSIS — D509 Iron deficiency anemia, unspecified: Secondary | ICD-10-CM

## 2018-07-20 NOTE — Telephone Encounter (Signed)
-----   Message from Lucilla Lame, MD sent at 07/15/2018  9:08 AM EDT ----- Let the patient know that her iron did go down and she should be set up for an EGD.

## 2018-07-20 NOTE — Telephone Encounter (Signed)
LVM for pt to return my call.

## 2018-07-20 NOTE — Telephone Encounter (Signed)
Pt returned call and was notified of lab results. Pt also scheduled for an EGD on 08/01/18.

## 2018-07-25 ENCOUNTER — Other Ambulatory Visit: Payer: Self-pay

## 2018-07-25 DIAGNOSIS — D509 Iron deficiency anemia, unspecified: Secondary | ICD-10-CM

## 2018-08-01 ENCOUNTER — Other Ambulatory Visit: Payer: Self-pay

## 2018-08-01 ENCOUNTER — Ambulatory Visit: Payer: Self-pay | Admitting: *Deleted

## 2018-08-01 ENCOUNTER — Ambulatory Visit: Admit: 2018-08-01 | Payer: Medicare HMO | Admitting: Gastroenterology

## 2018-08-01 DIAGNOSIS — G4733 Obstructive sleep apnea (adult) (pediatric): Secondary | ICD-10-CM

## 2018-08-01 DIAGNOSIS — J4520 Mild intermittent asthma, uncomplicated: Secondary | ICD-10-CM

## 2018-08-01 SURGERY — ESOPHAGOGASTRODUODENOSCOPY (EGD) WITH PROPOFOL
Anesthesia: Choice

## 2018-08-01 NOTE — Patient Instructions (Addendum)
Huey Romans will call you about scheduling an overnight oxygen test in your home.

## 2018-08-01 NOTE — Chronic Care Management (AMB) (Signed)
  Chronic Care Management   Follow Up Note   08/01/2018 Name: Amber Avery MRN: [id number] DOB: Dec 24, 1945  Referred by: Steele Sizer, MD Reason for referral : Care Coordination (DME Needs)  Subjective: "I'm getting a bill from my DME company that I wasn't expecting"  Assessment: Amber Avery is a 72 year old female primary care patient of Dr. Steele Sizer. Amber Avery has a medical history which includes dyslipidemia, asthma, DMII, CKD, and OSA.   Amber Avery was referred to the [facility] Program for assistance with chronic disease management,medication management, and DME care coordination needs.    Amber Avery called today to ask for help with sorting out billing and needed testing for continuation of CPAP w/ O2 qhs.   Goals Addressed    . "I need help with my oxgen and insurance" (pt-stated)       Patient states she is receiving billing now for her oxygen (used w/ her CPAP) that is much higher than she has been paying over the last 3 years. She states that the billed amount is cost prohibitive and she is unsure what to do to be able to afford her oxygen.   Clinical Goal(s): Over the next 14 days, patient will report collaboration and established appointment with DME provider re: needed study  Interventions: Outreach to payor for updated information re: payment and to DME provider; required order for overnight oximetry obtained and sent to [place]:  Patient will reach out to care management team if she has not heard from DME provider over the next 14 days.   Care Management team will follow up with Amber Avery in for general assessment and ongoing disease management support.   [facility] / [place] Management  [phone]

## 2018-08-08 ENCOUNTER — Ambulatory Visit (INDEPENDENT_AMBULATORY_CARE_PROVIDER_SITE_OTHER): Payer: Medicare HMO | Admitting: Family Medicine

## 2018-08-08 ENCOUNTER — Encounter: Payer: Self-pay | Admitting: Family Medicine

## 2018-08-08 ENCOUNTER — Other Ambulatory Visit: Payer: Self-pay

## 2018-08-08 ENCOUNTER — Ambulatory Visit: Payer: Self-pay | Admitting: *Deleted

## 2018-08-08 ENCOUNTER — Ambulatory Visit
Admission: RE | Admit: 2018-08-08 | Discharge: 2018-08-08 | Disposition: A | Discharge status: Home / Self Care | Payer: Medicare HMO | Source: Ambulatory Visit | Attending: Family Medicine | Admitting: Family Medicine

## 2018-08-08 VITALS — BP 114/68 | HR 81 | Temp 97.9°F | Resp 16 | Ht 62.0 in | Wt 272.7 lb

## 2018-08-08 DIAGNOSIS — E1122 Type 2 diabetes mellitus with diabetic chronic kidney disease: Secondary | ICD-10-CM | POA: Diagnosis not present

## 2018-08-08 DIAGNOSIS — N182 Chronic kidney disease, stage 2 (mild): Secondary | ICD-10-CM | POA: Diagnosis not present

## 2018-08-08 DIAGNOSIS — I10 Essential (primary) hypertension: Secondary | ICD-10-CM

## 2018-08-08 DIAGNOSIS — E1129 Type 2 diabetes mellitus with other diabetic kidney complication: Secondary | ICD-10-CM | POA: Diagnosis not present

## 2018-08-08 DIAGNOSIS — R809 Proteinuria, unspecified: Secondary | ICD-10-CM

## 2018-08-08 DIAGNOSIS — E785 Hyperlipidemia, unspecified: Secondary | ICD-10-CM

## 2018-08-08 DIAGNOSIS — G4733 Obstructive sleep apnea (adult) (pediatric): Secondary | ICD-10-CM | POA: Diagnosis not present

## 2018-08-08 DIAGNOSIS — R600 Localized edema: Secondary | ICD-10-CM | POA: Diagnosis not present

## 2018-08-08 DIAGNOSIS — Z1239 Encounter for other screening for malignant neoplasm of breast: Secondary | ICD-10-CM

## 2018-08-08 DIAGNOSIS — J4530 Mild persistent asthma, uncomplicated: Secondary | ICD-10-CM | POA: Diagnosis not present

## 2018-08-08 DIAGNOSIS — M170 Bilateral primary osteoarthritis of knee: Secondary | ICD-10-CM

## 2018-08-08 DIAGNOSIS — Z1231 Encounter for screening mammogram for malignant neoplasm of breast: Secondary | ICD-10-CM | POA: Insufficient documentation

## 2018-08-08 DIAGNOSIS — D509 Iron deficiency anemia, unspecified: Secondary | ICD-10-CM

## 2018-08-08 DIAGNOSIS — R2681 Unsteadiness on feet: Secondary | ICD-10-CM

## 2018-08-08 DIAGNOSIS — E049 Nontoxic goiter, unspecified: Secondary | ICD-10-CM | POA: Diagnosis not present

## 2018-08-08 LAB — POCT GLYCOSYLATED HEMOGLOBIN (HGB A1C): HbA1c, POC (controlled diabetic range): 6.6 % (ref 0.0–7.0)

## 2018-08-08 NOTE — Progress Notes (Signed)
Name: Amber Avery   MRN: [id number]    DOB: Jul 17, 1946   Date:08/08/2018       Progress Note  Subjective  Chief Complaint  Chief Complaint  Patient presents with  . Medication Refill  . Diabetes  . Dyslipidemia  . Asthma  . Sleep Apnea    HPI  Dyslipidemia: taking statin therapy, no side effects, no chest pain.Reviewed labs   Asthma Mild intermittent: she states she is doing much better since she started showering daily, she has a cough daily, but only during her shower, no wheezing or SOB. She uses albuterol very seldom when congested   DM with renal manifestation: HgbA1C was down to 6.9%, 7.2,,7.0%%,6.3% , 6.1%,6.5% today it is 6.6% She is on metformin, and Victoza since 11/2016, 1.2 mg of Victoza Tolerating medication well.She denies polyphagia, polydipsia or polyuria. Eye exam will be done today.On Ace for CKI used to have proteinuria, but normal urine micro.  Obesity: shewas doing well she has lost12 lbs fromFeb until October 2018 , however sister has a recurrence of Multiple Myeloma and she has been stress eating again, she gained 21 lbs from October 2018 till 11/2017, she is struggling in getting her down again. She states she felt much better when weight was down to 250 's. She states she will to cut down on portion size.   OSA: she wear machine every night, she has a new mask and uses oxygen with CPAP.She is doing well on new mask. Wakes up feeling rested, last sleep study was done in 2011 and she was approved for repeat study..   Right thyroid nodule; seen by Endo at [facility], biopsy done and negative. She is due for follow up, last visit was 2016  Varicose Veins: present for years and now has pain on left groin and upper thigh, bulging varicose. Seen by Vascular Surgeon but procedure will not be covered. Edema is very mild today   Gait instability: she uses a cane,they moved mailbox near her house and is doing well. Still has pain on both knees from  OA. She has a walk in shower and a ramp now and it has helped her to be more independent, she states the instability is because her knee gives out at times.   OA : she saw Ortho , Dr. Candelaria Stagers, and he advised her not to have surgery because of her weight, she had one round of hyaluronan injection without help and it was too expensive . She is still taking Tylenol , pain level is 0/10 during rest, however as the day progresses and she is moving around the pain can go up to 8/10. She has effusion when more active, no redness or increase in warmth. She has a hoveround, uses walker and sometimes cane, she would like to have a handicap placard.  Low back pain: she states back pain improved with epidural given by Dr. Phyllis Ginger back in 2017, she states no longer having Radiculitis down  left lower leg. Still has some left outer hip pain, most noticeable at night, but gabapentin seems to help with symptoms.   Iron deficiency anemia: seeing Dr. Tasia Catchings, and find rounds of iron infusion, is going to EGD done by Dr. Allen Norris done soon. Last hemoglobin is back to normal    Patient Active Problem List   Diagnosis Date Noted  . Iron deficiency anemia 04/20/2018  . Anemia, unspecified 04/12/2017  . Lichen sclerosus of female genitalia 06/29/2016  . Primary osteoarthritis of both knees 04/07/2016  .  Special screening for malignant neoplasms, colon   . Right thyroid nodule 06/26/2015  . Asthma, intermittent 04/03/2015  . Benign essential HTN 04/03/2015  . Carpal tunnel syndrome 04/03/2015  . Chronic kidney disease (CKD), stage II (mild) 04/03/2015  . Controlled gout 04/03/2015  . Arteriosclerosis of coronary artery 04/03/2015  . Diabetes mellitus with renal manifestations, controlled ([place]) 04/03/2015  . Dyslipidemia 04/03/2015  . Edema extremities 04/03/2015  . Elevated sedimentation rate 04/03/2015  . Knee pain 04/03/2015  . Lumbar radiculitis 04/03/2015  . Obstructive apnea 04/03/2015  . Lumbosacral  spondylosis 04/03/2015  . Osteoarthritis, chronic 04/03/2015  . Psoriasis 04/03/2015  . Vitamin D deficiency 04/03/2015  . Varicose veins 04/03/2015  . Goiter 12/23/2014  . Degeneration of intervertebral disc of lumbar region 08/23/2014  . Corns and callosity 06/11/2009  . Extreme obesity 04/11/2007    Past Surgical History:  Procedure Laterality Date  . APPENDECTOMY    . CHOLECYSTECTOMY  1977  . COLONOSCOPY WITH PROPOFOL N/A 01/21/2016   Procedure: COLONOSCOPY WITH PROPOFOL;  Surgeon: Lucilla Lame, MD;  Location: [facility] ENDOSCOPY;  Service: Endoscopy;  Laterality: N/A;  . DILATION AND CURETTAGE OF UTERUS     Due to Amenorrhea  . HEMORRHOID BANDING  1980  . HEMORROIDECTOMY    . HERNIA REPAIR  2011  . Temporal Area Excision Biopsy     For Birth Mark Changes, Negative Pathology  . TONSILLECTOMY AND ADENOIDECTOMY      Family History  Problem Relation Age of Onset  . Cancer Mother        brain tumor  . Kidney disease Mother   . Hypertension Mother   . Heart disease Father   . COPD Father   . Hypertension Sister   . Arthritis Sister   . Cancer Sister   . GER disease Sister   . Lupus Sister   . Diabetes Sister   . Arthritis Sister   . Osteopenia Sister   . Heart disease Sister   . Hypertension Sister   . Breast cancer Maternal Aunt 70  . Leukemia Maternal Aunt     Social History   Socioeconomic History  . Marital status: Divorced    Spouse name: Not on file  . Number of children: 0  . Years of education: Not on file  . Highest education level: 12th grade  Occupational History  . Occupation: Retired  Scientific laboratory technician  . Financial resource strain: Not hard at all  . Food insecurity:    Worry: Never true    Inability: Never true  . Transportation needs:    Medical: No    Non-medical: No  Tobacco Use  . Smoking status: Former Smoker    Packs/day: 2.00    Years: 15.00    Pack years: 30.00    Types: Cigarettes    Last attempt to quit: 1983    Years since quitting:  36.8  . Smokeless tobacco: Never Used  . Tobacco comment: smoking cessation materials not required  Substance and Sexual Activity  . Alcohol use: Not Currently    Alcohol/week: 0.0 standard drinks  . Drug use: No  . Sexual activity: Not Currently  Lifestyle  . Physical activity:    Days per week: 0 days    Minutes per session: 0 min  . Stress: Not at all  Relationships  . Social connections:    Talks on phone: Patient refused    Gets together: Patient refused    Attends religious service: Patient refused    Active member  of club or organization: Patient refused    Attends meetings of clubs or organizations: Patient refused    Relationship status: Divorced  . Intimate partner violence:    Fear of current or ex partner: No    Emotionally abused: No    Physically abused: No    Forced sexual activity: No  Other Topics Concern  . Not on file  Social History Narrative  . Not on file     Current Outpatient Medications:  .  acetaminophen (TYLENOL) 500 MG tablet, Take 1 tablet (500 mg total) by mouth every 6 (six) hours as needed. (Patient taking differently: Take 1,000 mg by mouth every 6 (six) hours as needed. ), Disp: 480 tablet, Rfl: 0 .  albuterol (ACCUNEB) 1.25 MG/3ML nebulizer solution, Take 3 mLs (1.25 mg total) by nebulization 2 (two) times daily., Disp: 75 mL, Rfl: 2 .  allopurinol (ZYLOPRIM) 300 MG tablet, Take 1 tablet (300 mg total) by mouth daily., Disp: 90 tablet, Rfl: 3 .  aspirin 81 MG tablet, Take 1 tablet by mouth daily., Disp: , Rfl:  .  atorvastatin (LIPITOR) 40 MG tablet, Daily for cholesterol, Disp: 90 tablet, Rfl: 3 .  benazepril (LOTENSIN) 20 MG tablet, Take 1 tablet (20 mg total) by mouth daily., Disp: 90 tablet, Rfl: 3 .  Blood Glucose Monitoring Suppl (TRUE METRIX AIR GLUCOSE METER) w/Device KIT, 1 each by Does not apply route 1 day or 1 dose., Disp: 1 kit, Rfl: 0 .  budesonide (PULMICORT) 0.25 MG/2ML nebulizer solution, Take 2 mLs (0.25 mg total) by  nebulization 2 (two) times daily., Disp: 60 mL, Rfl: 2 .  colchicine 0.6 MG tablet, Take 1 tablet by mouth 2 (two) times daily. Takes as needed for flares, Disp: , Rfl:  .  diclofenac sodium (VOLTAREN) 1 % GEL, Apply 4 g topically 4 (four) times daily. On both knees for OA, Disp: 100 g, Rfl: 1 .  furosemide (LASIX) 40 MG tablet, TAKE 1 TABLET 2 TIMES DAILYAS NEEDED, Disp: 90 tablet, Rfl: 3 .  gabapentin (NEURONTIN) 300 MG capsule, TAKE 1 CAPSULE THREE TIMES DAILY, Disp: 270 capsule, Rfl: 3 .  glucose blood test strip, Use as instructed, Disp: 100 each, Rfl: 12 .  Insulin Pen Needle 32G X 6 MM MISC, 1 each by Does not apply route 4 (four) times daily., Disp: 200 each, Rfl: 2 .  Lancets (ONETOUCH ULTRASOFT) lancets, Use as instructed, Disp: 100 each, Rfl: 12 .  liraglutide (VICTOZA) 18 MG/3ML SOPN, Inject 0.3 mLs (1.8 mg total) into the skin daily., Disp: 27 mL, Rfl: 1 .  metFORMIN (GLUCOPHAGE) 500 MG tablet, TAKE 1 TAB IN MORNING AND 2 TABS IN EVENING, Disp: 270 tablet, Rfl: 3 .  potassium chloride SA (K-DUR,KLOR-CON) 20 MEQ tablet, Take 1-2 tablets (20-40 mEq total) by mouth daily., Disp: 180 tablet, Rfl: 3 .  triamcinolone cream (KENALOG) 0.1 %, Apply 1 application topically as directed., Disp: , Rfl:  .  triamcinolone ointment (KENALOG) 0.1 %, APPLY A PEA SIZE AMOUNT EXTERNALLY TO AREA EVERY OTHER DAY, Disp: 80 g, Rfl: 1 .  Vitamin D, Cholecalciferol, 1000 UNITS TABS, Take 1 capsule by mouth daily., Disp: , Rfl:   Allergies  Allergen Reactions  . Codeine     I personally reviewed active problem list, medication list, allergies, family history, social history, health maintenance with the patient/caregiver today.   ROS  Constitutional: Negative for fever or signficant weight change.  Respiratory: Negative for cough and shortness of breath.   Cardiovascular: Negative  for chest pain or palpitations.  Gastrointestinal: Negative for abdominal pain, no bowel changes.  Musculoskeletal:  Negative for gait problem or joint swelling.  Skin: Negative for rash.  Neurological: Negative for dizziness or headache.  No other specific complaints in a complete review of systems (except as listed in HPI above).   Objective  Vitals:   08/08/18 1122  BP: 114/68  Pulse: 81  Resp: 16  Temp: 97.9 F (36.6 C)  TempSrc: Oral  SpO2: 96%  Weight: 272 lb 11.2 oz (123.7 kg)  Height: 5' 2"  (1.575 m)    Body mass index is 49.88 kg/m.  Physical Exam  Constitutional: Patient appears well-developed and well-nourished. Obese  No distress.  HEENT: head atraumatic, normocephalic, pupils equal and reactive to light,neck supple, throat within normal limits Cardiovascular: Normal rate, regular rhythm and normal heart sounds.  No murmur heard. No BLE edema. Pulmonary/Chest: Effort normal and breath sounds normal. No respiratory distress. Abdominal: Soft.  There is no tenderness. Muscular Skeletal: slow gait, using a cane Psychiatric: Patient has a normal mood and affect. behavior is normal. Judgment and thought content normal.  Recent Results (from the past 2160 hour(s))  Iron and TIBC     Status: None   Collection Time: 05/31/18  1:44 PM  Result Value Ref Range   Iron 42 28 - 170 ug/dL   TIBC 390 250 - 450 ug/dL   Saturation Ratios 11 10.4 - 31.8 %   UIBC 349 ug/dL    Comment: Performed at [facility], [address] [place], [place] [address]  Ferritin     Status: None   Collection Time: 05/31/18  1:44 PM  Result Value Ref Range   Ferritin 65 11 - 307 ng/mL    Comment: Performed at [facility], [address] [place], [place] [address]  CBC with Differential/Platelet     Status: Abnormal   Collection Time: 05/31/18  1:44 PM  Result Value Ref Range   WBC 7.6 3.6 - 11.0 K/uL   RBC 3.57 (L) 3.80 - 5.20 MIL/uL   Hemoglobin 11.0 (L) 12.0 - 16.0 g/dL   HCT 33.7 (L) 35.0 - 47.0 %   MCV 94.2 80.0 - 100.0 fL   MCH 30.6 26.0 - 34.0 pg   MCHC 32.5 32.0 - 36.0  g/dL   RDW 20.2 (H) 11.5 - 14.5 %   Platelets 261 150 - 440 K/uL   Neutrophils Relative % 64 %   Neutro Abs 4.9 1.4 - 6.5 K/uL   Lymphocytes Relative 20 %   Lymphs Abs 1.5 1.0 - 3.6 K/uL   Monocytes Relative 10 %   Monocytes Absolute 0.8 0.2 - 0.9 K/uL   Eosinophils Relative 5 %   Eosinophils Absolute 0.4 0 - 0.7 K/uL   Basophils Relative 1 %   Basophils Absolute 0.1 0 - 0.1 K/uL    Comment: Performed at [facility], [address] [place], [place] [address]  CBC with Differential/Platelet     Status: Abnormal   Collection Time: 07/14/18  4:01 PM  Result Value Ref Range   WBC 7.5 3.4 - 10.8 x10E3/uL   RBC 3.61 (L) 3.77 - 5.28 x10E6/uL   Hemoglobin 11.3 11.1 - 15.9 g/dL   Hematocrit 33.2 (L) 34.0 - 46.6 %   MCV 92 79 - 97 fL   MCH 31.3 26.6 - 33.0 pg   MCHC 34.0 31.5 - 35.7 g/dL   RDW 16.1 (H) 12.3 - 15.4 %   Platelets 218  150 - 450 x10E3/uL   Neutrophils 57 Not Estab. %   Lymphs 24 Not Estab. %   Monocytes 12 Not Estab. %   Eos 5 Not Estab. %   Basos 1 Not Estab. %   Neutrophils Absolute 4.3 1.4 - 7.0 x10E3/uL   Lymphocytes Absolute 1.8 0.7 - 3.1 x10E3/uL   Monocytes Absolute 0.9 0.1 - 0.9 x10E3/uL   EOS (ABSOLUTE) 0.4 0.0 - 0.4 x10E3/uL   Basophils Absolute 0.1 0.0 - 0.2 x10E3/uL   Immature Granulocytes 1 Not Estab. %   Immature Grans (Abs) 0.1 0.0 - 0.1 x10E3/uL  Ferritin     Status: None   Collection Time: 07/14/18  4:01 PM  Result Value Ref Range   Ferritin 78 15 - 150 ng/mL  Iron and TIBC     Status: Abnormal   Collection Time: 07/14/18  4:01 PM  Result Value Ref Range   Total Iron Binding Capacity 361 250 - 450 ug/dL   UIBC 326 118 - 369 ug/dL   Iron 35 27 - 139 ug/dL   Iron Saturation 10 (L) 15 - 55 %  POCT HgB A1C     Status: Abnormal   Collection Time: 08/08/18 11:35 AM  Result Value Ref Range   Hemoglobin A1C     HbA1c POC (<> result, manual entry)     HbA1c, POC (prediabetic range)     HbA1c, POC (controlled diabetic range) 6.6 0.0 - 7.0 %      PHQ2/9: Depression screen [facility] 2/9 08/08/2018 06/17/2018 06/15/2018 04/11/2018 07/01/2017  Decreased Interest 0 0 0 0 0  Down, Depressed, Hopeless 0 0 0 0 0  PHQ - 2 Score 0 0 0 0 0  Altered sleeping 1 0 - 1 -  Tired, decreased energy 2 0 - 1 -  Change in appetite 0 0 - 0 -  Feeling bad or failure about yourself  0 0 - 0 -  Trouble concentrating 0 0 - 1 -  Moving slowly or fidgety/restless 0 0 - 0 -  Suicidal thoughts 0 0 - 0 -  PHQ-9 Score 3 0 - 3 -  Difficult doing work/chores - Not difficult at all - Not difficult at all -     Fall Risk: Fall Risk  08/08/2018 06/17/2018 05/11/2018 05/04/2018 04/11/2018  Falls in the past year? No No No No No  Risk for fall due to : - Impaired vision;Impaired balance/gait - Impaired mobility -  Risk for fall due to: Comment - wears eyeglasses; ambuates with cane or rolling walker - - -      Assessment & Plan  1. Controlled type 2 diabetes mellitus with stage 2 chronic kidney disease, unspecified whether long term insulin use (HCC)  - POCT HgB A1C, at goal   2. Obstructive apnea  Finally got approval to have a repeat study done  3. Type 2 diabetes mellitus with microalbuminuria, without long-term current use of insulin (HCC)  HgbA1C is at goal   4. Well controlled mild persistent asthma  Doing well at this time  5. Benign essential HTN  At goal  6. Dyslipidemia  Continue medication   7. Bilateral primary osteoarthritis of knee  Affects her ability to walk, uses walker, cane or hoveround  8. Iron deficiency anemia, unspecified iron deficiency anemia type  Going to have EGD done soon   9. Morbid obesity due to excess calories [facility])  Discussed with the patient the risk posed by an increased BMI. Discussed importance of portion control, calorie  counting and at least 150 minutes of physical activity weekly. Avoid sweet beverages and drink more water. Eat at least 6 servings of fruit and vegetables daily   10. Gait  instability  Discussed importance of losing weight.   11. Bilateral edema of lower extremity  Stable   12. Goiter  - Ambulatory referral to Endocrinology

## 2018-08-08 NOTE — Chronic Care Management (AMB) (Signed)
  Chronic Care Management   Follow Up Note   08/08/2018 Name: Amber Avery MRN: [id number] DOB: 13-May-1946  Referred by: Steele Sizer, MD Reason for referral : Chronic Care Management (F/U Sleep Study/DME; HTN, DM)  Subjective: "I think I'm doing pretty good these days"   Objective: Lab Results  Component Value Date   HGBA1C 6.6 08/08/2018   HGBA1C 6.5 (A) 04/11/2018   HGBA1C 6.2 12/10/2017   Lab Results  Component Value Date   MICROALBUR 20 04/11/2018   LDLCALC 70 12/10/2017   CREATININE 1.17 (H) 04/11/2018   BP Readings from Last 3 Encounters:  08/08/18 114/68  07/14/18 (!) 148/71  06/17/18 110/62    Assessment: Amber Avery is a 72 year old female primary care patient of Dr. Steele Sizer. Amber Avery has a medical history which includes dyslipidemia, asthma, DMII, CKD, and OSA.   Amber Avery was referred to the [facility] for assistance with chronic disease management,medication management, and DME care coordination needs.  Goals Addressed    . "I need help with my oxgen and insurance" (pt-stated)       Patient reports today that she is working with her [employer] representative re: coverage for DME. Her PCP, Dr. Steele Sizer has provided needed authorization/documentation for her to schedule an outpatient sleep study and order for outpatient sleep study has been placed. Amber Avery has been notified.   Clinical Goal(s): Patient will attend scheduled sleep study appointment  Interventions: Outreach to referral coordinator re: sleep study planning    . "I want to keep doing good with my diabetes and blood pressure" (pt-stated)       Discussion with patient re: long term self health management of diabetes and HTN. She states that currently these chronic disease states are "stable". She has a bp monitor and cbg meter at home. She checks her cbg as prescribed. She does not check her blood pressure routinely. Patient takes all medications as prescribed.     Clinical Goal(s): Patient will monitor bp weekly and document, bringing her documentation to provider visits.   Interventions: Education provided re: long term management of HTN and DM; importance of long term self monitoring explained     . Exercise 3x per week (30 min per time)       Patient states she is walking sometimes with her walker and sometimes with cane but has been intentionally trying to get more exercise each week  Clinical Goal(s): Patient will continue to walk for exercise at least 3 times weekly  Interventions: Encouragement to continue exercise regimen and importance of exercise in management of chronic disease explained    Plan: Patient states she feels she is managing well. CCM team agrees. Patient will call CCM team with questions or needs re: care coordination or chronic disease management.    [facility] [place] [facility]  [phone]

## 2018-08-11 ENCOUNTER — Telehealth: Payer: Self-pay

## 2018-08-11 NOTE — Telephone Encounter (Signed)
Patient contacted office stated that she did not receive her instructions for her EGD.  I advised her to contact [facility] on the 28th between 1-3 pm for her arrival time.  NPO after midnight.  Patient does not take any blood thinners other than her baby aspirin.  Informed her that she may continue with meds but nothing to eat or drink after midnight.  Bring insurance card, picture id and designated person to stay with her for the procedure.  Thanks [employer]

## 2018-08-16 ENCOUNTER — Encounter: Payer: Self-pay | Admitting: Anesthesiology

## 2018-08-16 ENCOUNTER — Encounter
Admission: RE | Disposition: A | Discharge status: Home / Self Care | Payer: Self-pay | Source: Ambulatory Visit | Attending: Gastroenterology

## 2018-08-16 ENCOUNTER — Ambulatory Visit
Admission: RE | Admit: 2018-08-16 | Discharge: 2018-08-16 | Disposition: A | Discharge status: Home / Self Care | Payer: Medicare HMO | Source: Ambulatory Visit | Attending: Gastroenterology | Admitting: Gastroenterology

## 2018-08-16 ENCOUNTER — Ambulatory Visit: Payer: Medicare HMO | Admitting: Anesthesiology

## 2018-08-16 DIAGNOSIS — I10 Essential (primary) hypertension: Secondary | ICD-10-CM | POA: Insufficient documentation

## 2018-08-16 DIAGNOSIS — M109 Gout, unspecified: Secondary | ICD-10-CM | POA: Diagnosis not present

## 2018-08-16 DIAGNOSIS — K2971 Gastritis, unspecified, with bleeding: Secondary | ICD-10-CM | POA: Diagnosis not present

## 2018-08-16 DIAGNOSIS — I2510 Atherosclerotic heart disease of native coronary artery without angina pectoris: Secondary | ICD-10-CM | POA: Diagnosis not present

## 2018-08-16 DIAGNOSIS — J449 Chronic obstructive pulmonary disease, unspecified: Secondary | ICD-10-CM | POA: Diagnosis not present

## 2018-08-16 DIAGNOSIS — E119 Type 2 diabetes mellitus without complications: Secondary | ICD-10-CM | POA: Diagnosis not present

## 2018-08-16 DIAGNOSIS — Z87891 Personal history of nicotine dependence: Secondary | ICD-10-CM | POA: Diagnosis not present

## 2018-08-16 DIAGNOSIS — K449 Diaphragmatic hernia without obstruction or gangrene: Secondary | ICD-10-CM | POA: Insufficient documentation

## 2018-08-16 DIAGNOSIS — K2901 Acute gastritis with bleeding: Secondary | ICD-10-CM | POA: Diagnosis not present

## 2018-08-16 DIAGNOSIS — J45909 Unspecified asthma, uncomplicated: Secondary | ICD-10-CM | POA: Diagnosis not present

## 2018-08-16 DIAGNOSIS — K31819 Angiodysplasia of stomach and duodenum without bleeding: Secondary | ICD-10-CM | POA: Diagnosis not present

## 2018-08-16 DIAGNOSIS — Z794 Long term (current) use of insulin: Secondary | ICD-10-CM | POA: Diagnosis not present

## 2018-08-16 DIAGNOSIS — D509 Iron deficiency anemia, unspecified: Secondary | ICD-10-CM | POA: Insufficient documentation

## 2018-08-16 DIAGNOSIS — E785 Hyperlipidemia, unspecified: Secondary | ICD-10-CM | POA: Diagnosis not present

## 2018-08-16 DIAGNOSIS — Z7982 Long term (current) use of aspirin: Secondary | ICD-10-CM | POA: Diagnosis not present

## 2018-08-16 DIAGNOSIS — E1122 Type 2 diabetes mellitus with diabetic chronic kidney disease: Secondary | ICD-10-CM | POA: Diagnosis not present

## 2018-08-16 DIAGNOSIS — I129 Hypertensive chronic kidney disease with stage 1 through stage 4 chronic kidney disease, or unspecified chronic kidney disease: Secondary | ICD-10-CM | POA: Diagnosis not present

## 2018-08-16 DIAGNOSIS — D500 Iron deficiency anemia secondary to blood loss (chronic): Secondary | ICD-10-CM | POA: Diagnosis not present

## 2018-08-16 DIAGNOSIS — G4730 Sleep apnea, unspecified: Secondary | ICD-10-CM | POA: Insufficient documentation

## 2018-08-16 DIAGNOSIS — N182 Chronic kidney disease, stage 2 (mild): Secondary | ICD-10-CM | POA: Diagnosis not present

## 2018-08-16 DIAGNOSIS — Z79899 Other long term (current) drug therapy: Secondary | ICD-10-CM | POA: Insufficient documentation

## 2018-08-16 DIAGNOSIS — G4733 Obstructive sleep apnea (adult) (pediatric): Secondary | ICD-10-CM | POA: Diagnosis not present

## 2018-08-16 HISTORY — PX: ESOPHAGOGASTRODUODENOSCOPY (EGD) WITH PROPOFOL: SHX5813

## 2018-08-16 LAB — GLUCOSE, CAPILLARY: Glucose-Capillary: 134 mg/dL — ABNORMAL HIGH (ref 70–99)

## 2018-08-16 SURGERY — ESOPHAGOGASTRODUODENOSCOPY (EGD) WITH PROPOFOL
Anesthesia: General

## 2018-08-16 MED ORDER — LIDOCAINE HCL (PF) 1 % IJ SOLN
INTRAMUSCULAR | Status: AC
  Administered 2018-08-16: 0.3 mg via INTRADERMAL
  Filled 2018-08-16: qty 2

## 2018-08-16 MED ORDER — SODIUM CHLORIDE 0.9 % IV SOLN
INTRAVENOUS | Status: DC
  Administered 2018-08-16: 11:00:00 via INTRAVENOUS

## 2018-08-16 MED ORDER — IPRATROPIUM-ALBUTEROL 0.5-2.5 (3) MG/3ML IN SOLN
RESPIRATORY_TRACT | Status: AC
  Administered 2018-08-16: 3 mL via RESPIRATORY_TRACT
  Filled 2018-08-16: qty 3

## 2018-08-16 MED ORDER — IPRATROPIUM-ALBUTEROL 0.5-2.5 (3) MG/3ML IN SOLN
3.0000 mL | Freq: Once | RESPIRATORY_TRACT | Status: AC
  Administered 2018-08-16: 3 mL via RESPIRATORY_TRACT

## 2018-08-16 MED ORDER — PROPOFOL 500 MG/50ML IV EMUL
INTRAVENOUS | Status: DC | PRN
  Administered 2018-08-16: 150 ug/kg/min via INTRAVENOUS

## 2018-08-16 MED ORDER — LIDOCAINE HCL (PF) 1 % IJ SOLN
2.0000 mL | Freq: Once | INTRAMUSCULAR | Status: AC
  Administered 2018-08-16: 0.3 mg via INTRADERMAL

## 2018-08-16 MED ORDER — PROPOFOL 10 MG/ML IV BOLUS
INTRAVENOUS | Status: DC | PRN
  Administered 2018-08-16: 70 mg via INTRAVENOUS

## 2018-08-16 NOTE — Anesthesia Postprocedure Evaluation (Signed)
Anesthesia Post Note  Patient: Amber Avery  Procedure(s) Performed: ESOPHAGOGASTRODUODENOSCOPY (EGD) WITH PROPOFOL (N/A )  Patient location during evaluation: Endoscopy Anesthesia Type: General Level of consciousness: awake and alert Pain management: pain level controlled Vital Signs Assessment: post-procedure vital signs reviewed and stable Respiratory status: spontaneous breathing, nonlabored ventilation, respiratory function stable and patient connected to nasal cannula oxygen Cardiovascular status: blood pressure returned to baseline and stable Postop Assessment: no apparent nausea or vomiting Anesthetic complications: no     Last Vitals:  Vitals:   08/16/18 1051 08/16/18 1102  BP: (!) 130/58 107/67  Pulse: 75 87  Resp: 17 18  Temp: (!) 36.2 C (!) 36.1 C  SpO2: 96%     Last Pain:  Vitals:   08/16/18 1204  TempSrc:   PainSc: 0-No pain                 Precious Haws Andy Allende

## 2018-08-16 NOTE — Anesthesia Post-op Follow-up Note (Signed)
Anesthesia QCDR form completed.        

## 2018-08-16 NOTE — H&P (Signed)
Lucilla Lame, MD [address] [place], [place] [address] Phone:[phone] Fax : [phone]  Primary Care Physician:  Steele Sizer, MD Primary Gastroenterologist:  Dr. Allen Norris  Pre-Procedure History & Physical: HPI:  KEONA BILYEU is a 72 y.o. female is here for an colonoscopy.   Past Medical History:  Diagnosis Date  . Asthma   . Diabetes mellitus without complication ([place])   . Gout   . Hyperlipidemia   . Hypertension   . Iron deficiency anemia 04/20/2018  . PONV (postoperative nausea and vomiting)   . Shortness of breath dyspnea   . Sleep apnea     Past Surgical History:  Procedure Laterality Date  . APPENDECTOMY    . CHOLECYSTECTOMY  1977  . COLONOSCOPY WITH PROPOFOL N/A 01/21/2016   Procedure: COLONOSCOPY WITH PROPOFOL;  Surgeon: Lucilla Lame, MD;  Location: [facility] ENDOSCOPY;  Service: Endoscopy;  Laterality: N/A;  . DILATION AND CURETTAGE OF UTERUS     Due to Amenorrhea  . HEMORRHOID BANDING  1980  . HEMORROIDECTOMY    . HERNIA REPAIR  2011  . Temporal Area Excision Biopsy     For Birth Mark Changes, Negative Pathology  . TONSILLECTOMY AND ADENOIDECTOMY      Prior to Admission medications   Medication Sig Start Date End Date Taking? Authorizing Provider  albuterol (ACCUNEB) 1.25 MG/3ML nebulizer solution Take 3 mLs (1.25 mg total) by nebulization 2 (two) times daily. 12/11/16  Yes Sowles, Drue Stager, MD  acetaminophen (TYLENOL) 500 MG tablet Take 1 tablet (500 mg total) by mouth every 6 (six) hours as needed. Patient taking differently: Take 1,000 mg by mouth every 6 (six) hours as needed.  08/09/17   Steele Sizer, MD  allopurinol (ZYLOPRIM) 300 MG tablet Take 1 tablet (300 mg total) by mouth daily. 05/07/18   Steele Sizer, MD  aspirin 81 MG tablet Take 1 tablet by mouth daily. 03/26/15   [provider]  atorvastatin (LIPITOR) 40 MG tablet Daily for cholesterol 05/07/18   Sowles, Drue Stager, MD  benazepril (LOTENSIN) 20 MG tablet Take 1 tablet  (20 mg total) by mouth daily. 05/07/18   Steele Sizer, MD  Blood Glucose Monitoring Suppl (TRUE METRIX AIR GLUCOSE METER) w/Device KIT 1 each by Does not apply route 1 day or 1 dose. 05/20/17   Steele Sizer, MD  budesonide (PULMICORT) 0.25 MG/2ML nebulizer solution Take 2 mLs (0.25 mg total) by nebulization 2 (two) times daily. 12/11/16   Steele Sizer, MD  colchicine 0.6 MG tablet Take 1 tablet by mouth 2 (two) times daily. Takes as needed for flares    [provider]  diclofenac sodium (VOLTAREN) 1 % GEL Apply 4 g topically 4 (four) times daily. On both knees for OA 07/05/18   Steele Sizer, MD  furosemide (LASIX) 40 MG tablet TAKE 1 TABLET 2 TIMES DAILYAS NEEDED 05/07/18   Steele Sizer, MD  gabapentin (NEURONTIN) 300 MG capsule TAKE 1 CAPSULE THREE TIMES DAILY 05/07/18   Steele Sizer, MD  glucose blood test strip Use as instructed 07/06/17   Steele Sizer, MD  Insulin Pen Needle 32G X 6 MM MISC 1 each by Does not apply route 4 (four) times daily. 11/19/17   Steele Sizer, MD  Lancets Glory Rosebush ULTRASOFT) lancets Use as instructed 07/06/17   Steele Sizer, MD  liraglutide (VICTOZA) 18 MG/3ML SOPN Inject 0.3 mLs (1.8 mg total) into the skin daily. 04/11/18   Steele Sizer, MD  metFORMIN (GLUCOPHAGE) 500 MG tablet TAKE 1 TAB IN MORNING AND  2 TABS IN EVENING 05/07/18   Sowles, Drue Stager, MD  potassium chloride SA (K-DUR,KLOR-CON) 20 MEQ tablet Take 1-2 tablets (20-40 mEq total) by mouth daily. 05/07/18   Steele Sizer, MD  triamcinolone cream (KENALOG) 0.1 % Apply 1 application topically as directed.    [provider]  triamcinolone ointment (KENALOG) 0.1 % APPLY A PEA SIZE AMOUNT EXTERNALLY TO AREA EVERY OTHER DAY 02/25/18   Steele Sizer, MD  Vitamin D, Cholecalciferol, 1000 UNITS TABS Take 1 capsule by mouth daily. 03/28/15   [provider]    Allergies as of 07/25/2018 - Review Complete 07/14/2018  Allergen Reaction Noted  . Codeine  02/22/2015     Family History  Problem Relation Age of Onset  . Cancer Mother        brain tumor  . Kidney disease Mother   . Hypertension Mother   . Heart disease Father   . COPD Father   . Hypertension Sister   . Arthritis Sister   . Cancer Sister   . GER disease Sister   . Lupus Sister   . Diabetes Sister   . Arthritis Sister   . Osteopenia Sister   . Heart disease Sister   . Hypertension Sister   . Breast cancer Maternal Aunt 70  . Leukemia Maternal Aunt     Social History   Socioeconomic History  . Marital status: Divorced    Spouse name: Not on file  . Number of children: 0  . Years of education: Not on file  . Highest education level: 12th grade  Occupational History  . Occupation: Retired  Scientific laboratory technician  . Financial resource strain: Not hard at all  . Food insecurity:    Worry: Never true    Inability: Never true  . Transportation needs:    Medical: No    Non-medical: No  Tobacco Use  . Smoking status: Former Smoker    Packs/day: 2.00    Years: 15.00    Pack years: 30.00    Types: Cigarettes    Last attempt to quit: 1983    Years since quitting: 36.8  . Smokeless tobacco: Never Used  . Tobacco comment: smoking cessation materials not required  Substance and Sexual Activity  . Alcohol use: Not Currently    Alcohol/week: 0.0 standard drinks  . Drug use: No  . Sexual activity: Not Currently  Lifestyle  . Physical activity:    Days per week: 0 days    Minutes per session: 0 min  . Stress: Not at all  Relationships  . Social connections:    Talks on phone: Patient refused    Gets together: Patient refused    Attends religious service: Patient refused    Active member of club or organization: Patient refused    Attends meetings of clubs or organizations: Patient refused    Relationship status: Divorced  . Intimate partner violence:    Fear of current or ex partner: No    Emotionally abused: No    Physically abused: No    Forced sexual activity: No   Other Topics Concern  . Not on file  Social History Narrative  . Not on file    Review of Systems: See HPI, otherwise negative ROS  Physical Exam: BP (!) 130/58   Pulse 75   Temp (!) 97.1 F (36.2 C) (Tympanic)   Resp 17   Ht 5' 2" (1.575 m)   Wt 123.7 kg   SpO2 96%   BMI 49.88 kg/m  General:   Alert,  pleasant and cooperative in NAD Head:  Normocephalic and atraumatic. Neck:  Supple; no masses or thyromegaly. Lungs:  Clear throughout to auscultation.    Heart:  Regular rate and rhythm. Abdomen:  Soft, nontender and nondistended. Normal bowel sounds, without guarding, and without rebound.   Neurologic:  Alert and  oriented x4;  grossly normal neurologically.  Impression/Plan: Gwynn Burly is here for an colonoscopy to be performed for IDA  Risks, benefits, limitations, and alternatives regarding  colonoscopy have been reviewed with the patient.  Questions have been answered.  All parties agreeable.   Lucilla Lame, MD  08/16/2018, 11:11 AM

## 2018-08-16 NOTE — Op Note (Signed)
[facility] Patient Name: Amber Avery Procedure Date: 08/16/2018 11:11 AM MRN: [id number] Account #: [id number] Date of Birth: May 29, 1946 Admit Type: Outpatient Age: 72 Room: [facility] ENDO ROOM 4 Gender: Female Note Status: Finalized Procedure:            Upper GI endoscopy Indications:          Iron deficiency anemia Providers:            Lucilla Lame MD, MD Referring MD:         Bethena Roys. Sowles, MD (Referring MD) Medicines:            Propofol per Anesthesia Complications:        No immediate complications. Procedure:            Pre-Anesthesia Assessment:                       - Prior to the procedure, a History and Physical was                        performed, and patient medications and allergies were                        reviewed. The patient's tolerance of previous                        anesthesia was also reviewed. The risks and benefits of                        the procedure and the sedation options and risks were                        discussed with the patient. All questions were                        answered, and informed consent was obtained. Prior                        Anticoagulants: The patient has taken no previous                        anticoagulant or antiplatelet agents. ASA Grade                        Assessment: III - A patient with severe systemic                        disease. After reviewing the risks and benefits, the                        patient was deemed in satisfactory condition to undergo                        the procedure.                       After obtaining informed consent, the endoscope was                        passed under direct vision. Throughout the procedure,  the patient's blood pressure, pulse, and oxygen                        saturations were monitored continuously. The Endoscope                        was introduced through the mouth, and advanced to the                         second part of duodenum. The upper GI endoscopy was                        accomplished without difficulty. The patient tolerated                        the procedure well. Findings:      A small hiatal hernia was present.      Patchy moderate inflammation with hemorrhage characterized by erosions       was found in the gastric antrum.      A single 3 mm angiodysplastic lesion without bleeding was found in the       second portion of the duodenum. Coagulation for hemostasis using argon       beam at 2 liters/minute and 30 watts was successful. Impression:           - Small hiatal hernia.                       - Gastritis with hemorrhage.                       - A single non-bleeding angiodysplastic lesion in the                        duodenum. Treated with argon beam coagulation.                       - No specimens collected. Recommendation:       - Discharge patient to home.                       - Resume previous diet.                       - Continue present medications.                       - Return to my office in 3 weeks. Procedure Code(s):    --- Professional ---                       [phone], Esophagogastroduodenoscopy, flexible, transoral;                        with control of bleeding, any method Diagnosis Code(s):    --- Professional ---                       D50.9, Iron deficiency anemia, unspecified                       K31.819, Angiodysplasia of stomach and duodenum without  bleeding                       K29.71, Gastritis, unspecified, with bleeding CPT copyright 2018 American Medical Association. All rights reserved. The codes documented in this report are preliminary and upon coder review may  be revised to meet current compliance requirements. Lucilla Lame MD, MD 08/16/2018 11:33:13 AM This report has been signed electronically. Number of Addenda: 0 Note Initiated On: 08/16/2018 11:11 AM      [facility]

## 2018-08-16 NOTE — Anesthesia Procedure Notes (Signed)
Performed by: Demetrius Charity, CRNA Pre-anesthesia Checklist: Patient identified, Emergency Drugs available, Suction available, Patient being monitored and Timeout performed Patient Re-evaluated:Patient Re-evaluated prior to induction Induction Type: IV induction Comments: Supernova nasal CPAP used with ETCO2 monitoring

## 2018-08-16 NOTE — Transfer of Care (Signed)
Immediate Anesthesia Transfer of Care Note  Patient: Amber Avery  Procedure(s) Performed: ESOPHAGOGASTRODUODENOSCOPY (EGD) WITH PROPOFOL (N/A )  Patient Location: PACU  Anesthesia Type:General  Level of Consciousness: awake and alert   Airway & Oxygen Therapy: Patient Spontanous Breathing and Patient connected to nasal cannula oxygen  Post-op Assessment: Report given to RN and Post -op Vital signs reviewed and stable  Post vital signs: Reviewed and stable  Last Vitals:  Vitals Value Taken Time  BP    Temp    Pulse 83 08/16/2018 11:36 AM  Resp 23 08/16/2018 11:36 AM  SpO2 91 % 08/16/2018 11:36 AM  Vitals shown include unvalidated device data.  Last Pain:  Vitals:   08/16/18 1051  TempSrc: Tympanic  PainSc: 0-No pain         Complications: No apparent anesthesia complications

## 2018-08-16 NOTE — Anesthesia Preprocedure Evaluation (Signed)
Anesthesia Evaluation  Patient identified by MRN, date of birth, ID band Patient awake    Reviewed: Allergy & Precautions, H&P , NPO status , Patient's Chart, lab work & pertinent test results  History of Anesthesia Complications (+) PONV and history of anesthetic complications  Airway Mallampati: III  TM Distance: <3 FB Neck ROM: limited    Dental  (+) Chipped, Poor Dentition, Missing   Pulmonary shortness of breath, asthma , sleep apnea , former smoker,           Cardiovascular Exercise Tolerance: Good hypertension, (-) angina+ CAD  (-) Past MI and (-) DOE      Neuro/Psych  Neuromuscular disease negative psych ROS   GI/Hepatic negative GI ROS, Neg liver ROS,   Endo/Other  diabetes, Type 2  Renal/GU Renal disease  negative genitourinary   Musculoskeletal  (+) Arthritis ,   Abdominal   Peds  Hematology negative hematology ROS (+)   Anesthesia Other Findings Past Medical History: No date: Asthma No date: Diabetes mellitus without complication ([facility]) No date: Gout No date: Hyperlipidemia No date: Hypertension 04/20/2018: Iron deficiency anemia No date: PONV (postoperative nausea and vomiting) No date: Shortness of breath dyspnea No date: Sleep apnea  Past Surgical History: No date: APPENDECTOMY 1977: CHOLECYSTECTOMY 01/21/2016: COLONOSCOPY WITH PROPOFOL; N/A     Comment:  Procedure: COLONOSCOPY WITH PROPOFOL;  Surgeon: Lucilla Lame, MD;  Location: [facility] ENDOSCOPY;  Service: Endoscopy;              Laterality: N/A; No date: DILATION AND CURETTAGE OF UTERUS     Comment:  Due to Amenorrhea 1980: HEMORRHOID BANDING No date: HEMORROIDECTOMY 2011: HERNIA REPAIR No date: Temporal Area Excision Biopsy     Comment:  For [employer] Changes, Negative Pathology No date: TONSILLECTOMY AND ADENOIDECTOMY  BMI    Body Mass Index:  49.88 kg/m      Reproductive/Obstetrics negative OB ROS                              Anesthesia Physical Anesthesia Plan  ASA: III  Anesthesia Plan: General   Post-op Pain Management:    Induction: Intravenous  PONV Risk Score and Plan: Propofol infusion and TIVA  Airway Management Planned: Natural Airway and Nasal Cannula  Additional Equipment:   Intra-op Plan:   Post-operative Plan:   Informed Consent: I have reviewed the patients History and Physical, chart, labs and discussed the procedure including the risks, benefits and alternatives for the proposed anesthesia with the patient or authorized representative who has indicated his/her understanding and acceptance.   Dental Advisory Given  Plan Discussed with: Anesthesiologist, CRNA and Surgeon  Anesthesia Plan Comments: (Patient consented for risks of anesthesia including but not limited to:  - adverse reactions to medications - risk of intubation if required - damage to teeth, lips or other oral mucosa - sore throat or hoarseness - Damage to heart, brain, lungs or loss of life  Patient voiced understanding.)        Anesthesia Quick Evaluation

## 2018-08-17 ENCOUNTER — Encounter: Payer: Self-pay | Admitting: Gastroenterology

## 2018-08-24 ENCOUNTER — Ambulatory Visit: Payer: Medicare HMO | Attending: Neurology

## 2018-08-24 DIAGNOSIS — G4733 Obstructive sleep apnea (adult) (pediatric): Secondary | ICD-10-CM | POA: Diagnosis present

## 2018-08-24 DIAGNOSIS — F5101 Primary insomnia: Secondary | ICD-10-CM | POA: Diagnosis not present

## 2018-08-24 DIAGNOSIS — R69 Illness, unspecified: Secondary | ICD-10-CM | POA: Diagnosis not present

## 2018-08-30 ENCOUNTER — Ambulatory Visit (INDEPENDENT_AMBULATORY_CARE_PROVIDER_SITE_OTHER): Payer: Medicare HMO

## 2018-08-30 DIAGNOSIS — E1122 Type 2 diabetes mellitus with diabetic chronic kidney disease: Secondary | ICD-10-CM | POA: Diagnosis not present

## 2018-08-30 DIAGNOSIS — N182 Chronic kidney disease, stage 2 (mild): Secondary | ICD-10-CM

## 2018-08-30 DIAGNOSIS — G4733 Obstructive sleep apnea (adult) (pediatric): Secondary | ICD-10-CM

## 2018-08-30 DIAGNOSIS — I10 Essential (primary) hypertension: Secondary | ICD-10-CM | POA: Diagnosis not present

## 2018-08-30 NOTE — Chronic Care Management (AMB) (Signed)
Chronic Care Management   Follow Up Note   08/30/2018 Name: FALECIA PERRETT MRN: [id number] DOB: 11-05-1945  Referred by: Alba Cory, MD Reason for referral : No chief complaint on file.    Subjective: "I had my sleep study but I did not sleep and the girl said it was inconclusive because I did not go into REM sleep". "I can't afford my oxygen and I need to know if I really need it"   Objective:  BP Readings from Last 3 Encounters:  08/16/18 107/67  08/08/18 114/68  07/14/18 (!) 148/71     Assessment: Mrs. Boulette is a 72 year old female primary care patient of Dr. Alba Cory. Mrs. Jessie has a medical history which includes dyslipidemia, asthma, DMII, CKD, and OSA.   Mrs. Ureta was referred to the [facility] for assistance with chronic disease management,medication management, and DME care coordination needs.  Goals Addressed    . "I need help with my oxgen and insurance" (pt-stated)       Ms. Pugmire continues to be frustrated that her insurance company is not paying for her nighttime oxygen any longer. She was recently prescribed an overnight oximetry to see if her oxygen levels dropped while wearing her CPAP without her oxygen however the insurance company "kicked it out" and requested an overnight sleep study at a sleep center". Ms. Riofrio states she told the adminitrator of the sleep study that she was there to see if she needed oxygen with her CPAP and was told "That is not what this test is for". Ms.Feuerstein continued with the study but states she did not sleep all night because the study was without her CPAP. She was told the test was "inconclusive" related to her not going into REM sleep.  Clinical Goal(s): Over the next 30 days, patient will understand plan of care related to night time oxygen use  Interventions: Collaborated with PCP regarding next steps in identifying patients continued need for nighttime oxygen.    [name] Kitchen "I want to keep doing good  with my diabetes and blood pressure" (pt-stated)       Ms. Magnone states she is monitoring her BP and blood glucose daily. Her fasting am reading today was 122. BP 120s. She states she is not recording her number "because the machine records them for me"   Clinical Goal(s): Patient will monitor bp weekly and document, bringing her documentation to provider visits.   Interventions: Education previously provided re: long term management of HTN and DM;  Reinforced importance of long term self monitoring and writing down numbers for a visual trend.     . Exercise 3x per week (30 min per time)       Ms. Pendelton continues to exercise weekly however she is not able to walk as she would like related to her knees being "bone on bone". She is afraid to walk outside related to her fall risk. She is utilizing her Sliver Sneakers videos for sitting and standing exercise (holding on to a chair)  Clinical Goal(s): Patient will continue to utilize videos weekly provided by Philis Nettle for her exercise  Interventions: Encouragement to continue exercise regimen and importance of exercise in management of chronic disease explained     Plan: Will collaborate with PCP regarding nighttime oxygen use and recent sleep study results and follow up with patient within then next 2 weeks.    Kayler Buckholtz E. Suzie Portela, RN, BSN Nurse Care Systems developer Center / [facility] Care Management  [phone])  [phone]

## 2018-08-31 NOTE — Patient Instructions (Signed)
1. Continue to check your blood sugar and blood pressure daily (notify your provider for any concerning numbers) 2. Exercise using your Silver Sneakers videos weekly and walk as tolerated. 3. Continue to use your oxygen with your CPAP until we can discuss Sleep Study with Dr. Ancil Boozer 4. Contact your CCM Team as needed for any question/concerns/needs  Goals Addressed    . "I need help with my oxgen and insurance" (pt-stated)         Clinical Goal(s): Over the next 30 days, patient will understand plan of care related to night time oxygen use  Interventions: Collaborated with PCP regarding next steps in identifying patients continued need for nighttime oxygen.    [name] Kitchen "I want to keep doing good with my diabetes and blood pressure" (pt-stated)         Clinical Goal(s): Patient will monitor bp weekly and document, bringing her documentation to provider visits.   Interventions: Education previously provided re: long term management of HTN and DM;  Reinforced importance of long term self monitoring      . Exercise 3x per week (30 min per time)         Clinical Goal(s): Patient will continue to utilize videos weekly provided by Garey Ham for her exercise  Interventions: Encouragement to continue exercise regimen and importance of exercise in management of chronic disease explained    CCM (Chronic Care Management) Team   Trish Fountain RN, BSN Nurse Care Coordinator  [phone]  Ruben Reason PharmD  Clinical Pharmacist  [phone]  The patient verbalized understanding of instructions provided today and declined a print copy of patient instruction materials.

## 2018-09-01 ENCOUNTER — Other Ambulatory Visit: Payer: Self-pay

## 2018-09-01 ENCOUNTER — Inpatient Hospital Stay (HOSPITAL_BASED_OUTPATIENT_CLINIC_OR_DEPARTMENT_OTHER): Payer: Medicare HMO | Admitting: Oncology

## 2018-09-01 ENCOUNTER — Ambulatory Visit: Payer: Self-pay

## 2018-09-01 ENCOUNTER — Inpatient Hospital Stay: Payer: Medicare HMO | Attending: Oncology

## 2018-09-01 ENCOUNTER — Encounter: Payer: Self-pay | Admitting: Oncology

## 2018-09-01 VITALS — BP 110/69 | HR 82 | Temp 96.0°F | Resp 18 | Wt 271.9 lb

## 2018-09-01 DIAGNOSIS — Z79899 Other long term (current) drug therapy: Secondary | ICD-10-CM | POA: Diagnosis not present

## 2018-09-01 DIAGNOSIS — M109 Gout, unspecified: Secondary | ICD-10-CM | POA: Diagnosis not present

## 2018-09-01 DIAGNOSIS — Z87891 Personal history of nicotine dependence: Secondary | ICD-10-CM | POA: Insufficient documentation

## 2018-09-01 DIAGNOSIS — I129 Hypertensive chronic kidney disease with stage 1 through stage 4 chronic kidney disease, or unspecified chronic kidney disease: Secondary | ICD-10-CM

## 2018-09-01 DIAGNOSIS — N189 Chronic kidney disease, unspecified: Secondary | ICD-10-CM | POA: Insufficient documentation

## 2018-09-01 DIAGNOSIS — E6609 Other obesity due to excess calories: Secondary | ICD-10-CM

## 2018-09-01 DIAGNOSIS — E785 Hyperlipidemia, unspecified: Secondary | ICD-10-CM | POA: Diagnosis not present

## 2018-09-01 DIAGNOSIS — K922 Gastrointestinal hemorrhage, unspecified: Secondary | ICD-10-CM

## 2018-09-01 DIAGNOSIS — Z794 Long term (current) use of insulin: Secondary | ICD-10-CM | POA: Diagnosis not present

## 2018-09-01 DIAGNOSIS — G4733 Obstructive sleep apnea (adult) (pediatric): Secondary | ICD-10-CM

## 2018-09-01 DIAGNOSIS — D649 Anemia, unspecified: Secondary | ICD-10-CM

## 2018-09-01 DIAGNOSIS — E1122 Type 2 diabetes mellitus with diabetic chronic kidney disease: Secondary | ICD-10-CM | POA: Diagnosis not present

## 2018-09-01 DIAGNOSIS — Z7982 Long term (current) use of aspirin: Secondary | ICD-10-CM | POA: Diagnosis not present

## 2018-09-01 DIAGNOSIS — D500 Iron deficiency anemia secondary to blood loss (chronic): Secondary | ICD-10-CM

## 2018-09-01 DIAGNOSIS — D509 Iron deficiency anemia, unspecified: Secondary | ICD-10-CM

## 2018-09-01 LAB — CBC WITH DIFFERENTIAL/PLATELET
Abs Immature Granulocytes: 0.04 10*3/uL (ref 0.00–0.07)
Basophils Absolute: 0.1 10*3/uL (ref 0.0–0.1)
Basophils Relative: 1 %
Eosinophils Absolute: 0.7 10*3/uL — ABNORMAL HIGH (ref 0.0–0.5)
Eosinophils Relative: 10 %
HCT: 35.7 % — ABNORMAL LOW (ref 36.0–46.0)
Hemoglobin: 11.1 g/dL — ABNORMAL LOW (ref 12.0–15.0)
Immature Granulocytes: 1 %
Lymphocytes Relative: 21 %
Lymphs Abs: 1.5 10*3/uL (ref 0.7–4.0)
MCH: 30.2 pg (ref 26.0–34.0)
MCHC: 31.1 g/dL (ref 30.0–36.0)
MCV: 97 fL (ref 80.0–100.0)
Monocytes Absolute: 0.6 10*3/uL (ref 0.1–1.0)
Monocytes Relative: 9 %
Neutro Abs: 4.2 10*3/uL (ref 1.7–7.7)
Neutrophils Relative %: 58 %
Platelets: 251 10*3/uL (ref 150–400)
RBC: 3.68 MIL/uL — ABNORMAL LOW (ref 3.87–5.11)
RDW: 15.5 % (ref 11.5–15.5)
WBC: 7.2 10*3/uL (ref 4.0–10.5)
nRBC: 0 % (ref 0.0–0.2)

## 2018-09-01 LAB — FERRITIN: Ferritin: 52 ng/mL (ref 11–307)

## 2018-09-01 LAB — IRON AND TIBC
Iron: 59 ug/dL (ref 28–170)
Saturation Ratios: 15 % (ref 10.4–31.8)
TIBC: 408 ug/dL (ref 250–450)
UIBC: 349 ug/dL

## 2018-09-01 NOTE — Progress Notes (Signed)
Patient here for follow. Pt stopped taking aspirin because it was causing "stomach pain and bleeding."

## 2018-09-01 NOTE — Progress Notes (Signed)
Hematology/Oncology  Follow up note [facility] [phone] [phone]   Patient Care Team: Steele Sizer, MD as PCP - General (Family Medicine) Bryson Ha, OD as Consulting Physician (Optometry) Sharlet Salina, MD as Consulting Physician (Physical Medicine and Rehabilitation) Diamond Nickel, DO as Consulting Physician (Sports Medicine) Clerance Lav, RN as Case Manager Cathi Roan, [facility] as Case Manager (Pharmacist)  REFERRING PROVIDER: Steele Sizer, MD CHIEF COMPLAINTS/REASON FOR VISIT:  Evaluation of anemia  HISTORY OF PRESENTING ILLNESS:  Amber Avery is a  72 y.o.  female with PMH listed below who was referred to me for evaluation of anemia. Patient recently had lab work done ordered by primary care physician.  Labs were reviewed by me.  Revealed anemia with hemoglobin of 10.4, MCV 87.8, platelet counts 229, normal WBC count 7.2, normal differential. Iron decreased panel showed saturation at 9, TIBC 471, ferritin 16, iron 41. Creatinine 1.17, estimated GFR 47. Reviewed patient's previous labs ordered by primary care physician's office, anemia duration is chronic, dated back to 2018, ranges between 10-11.No aggravating or improving factors.  Associated symptoms: Associated with fatigue and SOB with exertion.  Context: Denies weight loss, easy bruising, hematochezia, hemoptysis, hematuria.   Context: History of GI bleeding: Denies               History of CKD yes:, Estimated GFR 47.               History of autoimmune disease denies               History of hemolytic anemia.  Denies           Last colonoscopy was done in 2017    which showed diverticulitis, nonbleeding hemorrhoids.  In addition patient also reports history of peptic ulcer disease stomach discomfort.  Denies seeing any melena.  INTERVAL HISTORY Amber Avery is a 72 y.o. female who has above history reviewed by me today presents for follow up  visit for management of iron deficiency anemia. Patient reports that fatigue is slightly better.  However not to the baseline yet. 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 Aspirin was advised to be stopped by Dr. Allen Norris. Patient reports that her chronic abdominal discomfort has resolved since she stopped aspirin.  Otherwise no new complaints. Review of Systems  Constitutional: Positive for malaise/fatigue. Negative for chills, fever and weight loss.  HENT: Negative for congestion, ear discharge, ear pain, nosebleeds, sinus pain and sore throat.   Eyes: Negative for double vision, photophobia, pain, discharge and redness.  Respiratory: Negative for cough, hemoptysis, sputum production, shortness of breath and wheezing.   Cardiovascular: Negative for chest pain, palpitations, orthopnea, claudication and leg swelling.  Gastrointestinal: Negative for abdominal pain, blood in stool, constipation, diarrhea, heartburn, melena, nausea and vomiting.       Stomach discomfort resolved  Genitourinary: Negative for dysuria, flank pain, frequency and hematuria.  Musculoskeletal: Negative for back pain, myalgias and neck pain.  Skin: Negative for itching and rash.  Neurological: Negative for dizziness, tingling, tremors, focal weakness, weakness and headaches.  Endo/Heme/Allergies: Negative for environmental allergies. Does not bruise/bleed easily.  Psychiatric/Behavioral: Negative for depression and hallucinations. The patient is not nervous/anxious.     MEDICAL HISTORY:  Past Medical History:  Diagnosis Date  . Asthma   . Diabetes mellitus without complication ([place])   .  Gout   . Hyperlipidemia   . Hypertension   . Iron deficiency anemia 04/20/2018  . PONV (postoperative nausea and vomiting)   . Shortness of breath  dyspnea   . Sleep apnea     SURGICAL HISTORY: Past Surgical History:  Procedure Laterality Date  . APPENDECTOMY    . CHOLECYSTECTOMY  1977  . COLONOSCOPY WITH PROPOFOL N/A 01/21/2016   Procedure: COLONOSCOPY WITH PROPOFOL;  Surgeon: Lucilla Lame, MD;  Location: [facility] ENDOSCOPY;  Service: Endoscopy;  Laterality: N/A;  . DILATION AND CURETTAGE OF UTERUS     Due to Amenorrhea  . ESOPHAGOGASTRODUODENOSCOPY (EGD) WITH PROPOFOL N/A 08/16/2018   Procedure: ESOPHAGOGASTRODUODENOSCOPY (EGD) WITH PROPOFOL;  Surgeon: Lucilla Lame, MD;  Location: [facility] ENDOSCOPY;  Service: Endoscopy;  Laterality: N/A;  . Jette  . HEMORROIDECTOMY    . HERNIA REPAIR  2011  . Temporal Area Excision Biopsy     For Birth Mark Changes, Negative Pathology  . TONSILLECTOMY AND ADENOIDECTOMY      SOCIAL HISTORY: Social History   Socioeconomic History  . Marital status: Divorced    Spouse name: Not on file  . Number of children: 0  . Years of education: Not on file  . Highest education level: 12th grade  Occupational History  . Occupation: Retired  Scientific laboratory technician  . Financial resource strain: Not hard at all  . Food insecurity:    Worry: Never true    Inability: Never true  . Transportation needs:    Medical: No    Non-medical: No  Tobacco Use  . Smoking status: Former Smoker    Packs/day: 2.00    Years: 15.00    Pack years: 30.00    Types: Cigarettes    Last attempt to quit: 1983    Years since quitting: 36.8  . Smokeless tobacco: Never Used  . Tobacco comment: smoking cessation materials not required  Substance and Sexual Activity  . Alcohol use: Not Currently    Alcohol/week: 0.0 standard drinks  . Drug use: No  . Sexual activity: Not Currently  Lifestyle  . Physical activity:    Days per week: 0 days    Minutes per session: 0 min  . Stress: Not at all  Relationships  . Social connections:    Talks on phone: Patient refused    Gets together: Patient refused    Attends  religious service: Patient refused    Active member of club or organization: Patient refused    Attends meetings of clubs or organizations: Patient refused    Relationship status: Divorced  . Intimate partner violence:    Fear of current or ex partner: No    Emotionally abused: No    Physically abused: No    Forced sexual activity: No  Other Topics Concern  . Not on file  Social History Narrative  . Not on file    FAMILY HISTORY: Family History  Problem Relation Age of Onset  . Cancer Mother        brain tumor  . Kidney disease Mother   . Hypertension Mother   . Heart disease Father   . COPD Father   . Hypertension Sister   . Arthritis Sister   . Cancer Sister   . GER disease Sister   . Lupus Sister   . Diabetes Sister   . Arthritis Sister   . Osteopenia Sister   . Heart disease Sister   . Hypertension Sister   . Breast cancer Maternal Aunt 70  .  Leukemia Maternal Aunt     ALLERGIES:  is allergic to codeine.  MEDICATIONS:  Current Outpatient Medications  Medication Sig Dispense Refill  . acetaminophen (TYLENOL) 500 MG tablet Take 1 tablet (500 mg total) by mouth every 6 (six) hours as needed. (Patient taking differently: Take 1,000 mg by mouth every 6 (six) hours as needed. ) 480 tablet 0  . albuterol (ACCUNEB) 1.25 MG/3ML nebulizer solution Take 3 mLs (1.25 mg total) by nebulization 2 (two) times daily. 75 mL 2  . allopurinol (ZYLOPRIM) 300 MG tablet Take 1 tablet (300 mg total) by mouth daily. 90 tablet 3  . atorvastatin (LIPITOR) 40 MG tablet Daily for cholesterol 90 tablet 3  . benazepril (LOTENSIN) 20 MG tablet Take 1 tablet (20 mg total) by mouth daily. 90 tablet 3  . Blood Glucose Monitoring Suppl (TRUE METRIX AIR GLUCOSE METER) w/Device KIT 1 each by Does not apply route 1 day or 1 dose. 1 kit 0  . budesonide (PULMICORT) 0.25 MG/2ML nebulizer solution Take 2 mLs (0.25 mg total) by nebulization 2 (two) times daily. 60 mL 2  . colchicine 0.6 MG tablet Take 1  tablet by mouth 2 (two) times daily. Takes as needed for flares    . diclofenac sodium (VOLTAREN) 1 % GEL Apply 4 g topically 4 (four) times daily. On both knees for OA 100 g 1  . furosemide (LASIX) 40 MG tablet TAKE 1 TABLET 2 TIMES DAILYAS NEEDED 90 tablet 3  . gabapentin (NEURONTIN) 300 MG capsule TAKE 1 CAPSULE THREE TIMES DAILY 270 capsule 3  . glucose blood test strip Use as instructed 100 each 12  . Insulin Pen Needle 32G X 6 MM MISC 1 each by Does not apply route 4 (four) times daily. 200 each 2  . Lancets (ONETOUCH ULTRASOFT) lancets Use as instructed 100 each 12  . liraglutide (VICTOZA) 18 MG/3ML SOPN Inject 0.3 mLs (1.8 mg total) into the skin daily. 27 mL 1  . metFORMIN (GLUCOPHAGE) 500 MG tablet TAKE 1 TAB IN MORNING AND 2 TABS IN EVENING 270 tablet 3  . potassium chloride SA (K-DUR,KLOR-CON) 20 MEQ tablet Take 1-2 tablets (20-40 mEq total) by mouth daily. 180 tablet 3  . triamcinolone cream (KENALOG) 0.1 % Apply 1 application topically as directed.    . triamcinolone ointment (KENALOG) 0.1 % APPLY A PEA SIZE AMOUNT EXTERNALLY TO AREA EVERY OTHER DAY 80 g 1  . Vitamin D, Cholecalciferol, 1000 UNITS TABS Take 1 capsule by mouth daily.    [name] Kitchen aspirin 81 MG tablet Take 1 tablet by mouth daily.     No current facility-administered medications for this visit.      PHYSICAL EXAMINATION: ECOG PERFORMANCE STATUS: 1 - Symptomatic but completely ambulatory Vitals:   09/01/18 1325  BP: 110/69  Pulse: 82  Resp: 18  Temp: (!) 96 F (35.6 C)   Filed Weights   09/01/18 1325  Weight: 271 lb 14.4 oz (123.3 kg)    Physical Exam  Constitutional: She is oriented to person, place, and time. No distress.  Morbidly obese, walks with a walker.  HENT:  Head: Normocephalic and atraumatic.  Right Ear: External ear normal.  Left Ear: External ear normal.  Mouth/Throat: Oropharynx is clear and moist.  Eyes: Pupils are equal, round, and reactive to light. Conjunctivae and EOM are normal. No  scleral icterus.  Neck: Normal range of motion. Neck supple.  Cardiovascular: Normal rate, regular rhythm and normal heart sounds.  Pulmonary/Chest: Effort normal. No respiratory distress. She  has no wheezes. She has no rales. She exhibits no tenderness.  Decreased breath sound bilaterally  Abdominal: Soft. Bowel sounds are normal. She exhibits no distension and no mass. There is no tenderness.  Musculoskeletal: Normal range of motion. She exhibits no edema or deformity.  Neurological: She is alert and oriented to person, place, and time. No cranial nerve deficit. Coordination normal.  Skin: Skin is warm and dry. No rash noted. No erythema.  Psychiatric: She has a normal mood and affect.     LABORATORY DATA:  I have reviewed the data as listed Lab Results  Component Value Date   WBC 7.2 09/01/2018   HGB 11.1 (L) 09/01/2018   HCT 35.7 (L) 09/01/2018   MCV 97.0 09/01/2018   PLT 251 09/01/2018   Recent Labs    12/10/17 1025 04/11/18 1142  NA 138 141  K 5.2 4.9  CL 103 105  CO2 24 26  GLUCOSE 94 105*  BUN 41* 42*  CREATININE 1.20* 1.17*  CALCIUM 9.7 9.6  GFRNONAA  --  47*  GFRAA  --  55*  PROT 6.6 6.9  AST 37* 39*  ALT 44* 33*  BILITOT 0.5 0.5   Iron/TIBC/Ferritin/ %Sat    Component Value Date/Time   IRON 35 07/14/2018 1601   TIBC 361 07/14/2018 1601   FERRITIN 78 07/14/2018 1601   IRONPCTSAT 10 (L) 07/14/2018 1601   IRONPCTSAT 9 (L) 04/11/2018 1142        ASSESSMENT & PLAN:  1. Iron deficiency anemia due to chronic blood loss   2. Chronic GI bleeding    #Labs reviewed and discussed with patient. Hemoglobin is 11.1 today slightly lower than last visit.,.  Iron panel showed borderline iron saturation of 15, elevated TIBC at 408. Recommend 1 more dose of IV Venofer 200 mg.  #Chronic GI bleeding due to aspirin, status post endoscopy.  Aspirin has been stopped per GI recommendation. Hopefully her hemoglobin and iron store remain stable from now on.  Recommend  repeat CBC, ferritin, iron, TIBC in 6 months prior to reevaluation for the need of additional IV iron.   Orders Placed This Encounter  Procedures  . CBC with Differential/Platelet    Standing Status:   Future    Standing Expiration Date:   09/02/2019  . Iron and TIBC    Standing Status:   Future    Standing Expiration Date:   09/02/2019  . Ferritin    Standing Status:   Future    Standing Expiration Date:   09/02/2019    All questions were answered. The patient knows to call the clinic with any problems questions or concerns. Total face to face encounter time for this patient visit was 15 min. >50% of the time was  spent in counseling and coordination of care.   Cc Dr.Sowel  Cc Dr.Wohl  Earlie Server, MD, PhD Hematology Oncology [facility] Pager- [phone] 09/01/2018

## 2018-09-01 NOTE — Addendum Note (Signed)
Addended by: Clerance Lav on: 09/01/2018 02:22 PM   Modules accepted: Level of Service, SmartSet

## 2018-09-01 NOTE — Progress Notes (Signed)
This encounter was created in error - please disregard.

## 2018-09-02 NOTE — Chronic Care Management (AMB) (Signed)
  Chronic Care Management   Note  09/02/2018 Name: Amber Avery MRN: [id number] DOB: May 27, 1946   Assessment: Amber Avery is a 72 year old female primary care patient of Dr. Steele Sizer. Amber Avery has a medical history which includes dyslipidemia, asthma, DMII, CKD, and OSA.   Amber Avery was referred to the [facility] Program for assistance with chronic disease management,medication management, and DME care coordination needs.   Amber Avery is still receiving monthly bills for her nocturnal oxygen that is causing a financial hardship.  Goals    . "I need help with my oxgen and insurance" (pt-stated)     09/01/18 Spoke with Amber Avery (representative with [employer]) who states patient is receiving bills for the full price of her nighttime oxygen related to denial from her insurance company, [employer]. Amber Avery states her claims are denied related to Dx. 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  09/01/18 Spoke with Amber Avery, an [employer]. Amber Avery states nocturnal oxygen claims are denied related to nocturnal oxygen being associated with the diagnosis of OSA and this was not a coverable dx for oxygen any longer. Amber Avery states it may be possible to submit a predetermination for an overnight oximetry if you submit an urgent request including clinical notes, procedure code for overnight oximetry, diagnosis code, how many procedure sessions (1), email and phone number of provider, reason for request (to see if patient is still in need of nocturnal O2). Please fax to [phone] and Attn:predetermination  Clinical Goal(s): Over the next 30 days, patient will understand plan of care related to night time oxygen use  Interventions: Collaborated with PCP regarding next steps in identifying patients continued need for nighttime oxygen.    Plan: Will colloborate with PCP related to conversations with [employer] and [employer]  E. Rollene Rotunda, RN, BSN Nurse Care Coordinator [facility] Care Management  [phone]

## 2018-09-06 ENCOUNTER — Ambulatory Visit: Payer: Self-pay

## 2018-09-06 ENCOUNTER — Ambulatory Visit: Payer: Self-pay | Admitting: Gastroenterology

## 2018-09-06 ENCOUNTER — Encounter: Payer: Self-pay | Admitting: *Deleted

## 2018-09-06 DIAGNOSIS — G4733 Obstructive sleep apnea (adult) (pediatric): Secondary | ICD-10-CM

## 2018-09-06 NOTE — Chronic Care Management (AMB) (Signed)
Chronic Care Management   Note  09/06/2018 Name: Amber Avery MRN: [id number] DOB: September 01, 1946    Care Coordination: CCM Clinic RN CM spoke with Gala Murdoch, representative with [facility], to secure documentation initially provided to [employer] requesting nocturnal O2 for patient. Gala Murdoch will provide documentation of initial order request for oxygen via fax to provider office.     Kalum Minner E. Suzie Portela, RN, BSN Nurse Care Coordinator [facility] / [facility] Care Management  [phone]

## 2018-09-08 ENCOUNTER — Ambulatory Visit: Payer: Self-pay

## 2018-09-08 DIAGNOSIS — J4520 Mild intermittent asthma, uncomplicated: Secondary | ICD-10-CM

## 2018-09-08 DIAGNOSIS — G4733 Obstructive sleep apnea (adult) (pediatric): Secondary | ICD-10-CM

## 2018-09-08 DIAGNOSIS — I10 Essential (primary) hypertension: Secondary | ICD-10-CM

## 2018-09-08 NOTE — Chronic Care Management (AMB) (Signed)
Chronic Care Management   Follow Up Note   09/08/2018 Name: SHAKESHIA CHERRY MRN: [id number] DOB: [birth year]  Referred by: Alba Cory, MD Reason for referral : Chronic Care Management (follow up nocturnal oxygen )    Subjective: "I am still having to pay more for my oxygen that I should"   Objective:   Assessment: Mrs. Pace is a 72 year old female primary care patient of Dr. Alba Cory. Mrs. Morais has a medical history which includes dyslipidemia, asthma, DMII, CKD, and OSA.   Mrs. Godbolt was referred to the [facility] Program for assistance with chronic disease management,medication management, and DME care coordination needs.   Mrs. Hine is still receiving monthly bills for her nocturnal oxygen that is causing a financial hardship. Multiple calls to [employer] and [employer] and coordination of care efforts have been made to assist patient with resolution of claim denials.  Goals Addressed    . "I need help with my oxgen and insurance" (pt-stated)       Mrs. Fuquay is still receiving monthly bills for her nocturnal oxygen that is causing a financial hardship. Multiple calls to [employer] and [employer] and coordination of care efforts have been made to assist patient with resolution of claim denials.  Clinical Goal(s): Over the next 30 days, patient will understand plan of care related to night time oxygen use  Interventions: Spoke with [employer] representative Chrystal to determine process needed for scheduling overnight oximetry ON CPAP, OFF O2 to determine/requalify patient for nocturnal O2. Collaborated with PCP to obtain order and faxed to [employer]. Patient notified       Plan: Will follow up with patient in two weeks   Clancy Mullarkey E. Suzie Portela, RN, BSN Nurse Care Coordinator [facility] / [facility] Care Management  [phone]

## 2018-09-08 NOTE — Patient Instructions (Addendum)
1. Please be on the look out for a phone call from [place] for overnight oximetry scheduling. 2. Call the CCM RN CM if you have not received a call from [place] within the next 2 weeks.  CCM (Chronic Care Management) Team   Trish Fountain RN, BSN Nurse Care Coordinator  [phone]  Ruben Reason PharmD  Clinical Pharmacist  [phone]  Goals Addressed    . "I need help with my oxgen and insurance" (pt-stated)         Clinical Goal(s): Over the next 30 days, patient will understand plan of care related to night time oxygen use  Interventions: Spoke with [employer] representative Chrystal to determine process needed for scheduling overnight oximetry ON CPAP, OFF O2 to determine/requalify patient for nocturnal O2. Collaborated with PCP to obtain order and faxed to [place]. Patient notified    The patient verbalized understanding of instructions provided today and declined a print copy of patient instruction materials.

## 2018-09-09 ENCOUNTER — Inpatient Hospital Stay: Payer: Medicare HMO

## 2018-09-09 VITALS — BP 112/78 | HR 82 | Temp 97.1°F | Resp 18

## 2018-09-09 DIAGNOSIS — D500 Iron deficiency anemia secondary to blood loss (chronic): Secondary | ICD-10-CM | POA: Diagnosis not present

## 2018-09-09 DIAGNOSIS — D509 Iron deficiency anemia, unspecified: Secondary | ICD-10-CM

## 2018-09-09 MED ORDER — IRON SUCROSE 20 MG/ML IV SOLN
200.0000 mg | Freq: Once | INTRAVENOUS | Status: AC
  Administered 2018-09-09: 200 mg via INTRAVENOUS
  Filled 2018-09-09: qty 10

## 2018-09-13 ENCOUNTER — Telehealth: Payer: Self-pay

## 2018-09-14 DIAGNOSIS — R0681 Apnea, not elsewhere classified: Secondary | ICD-10-CM | POA: Diagnosis not present

## 2018-09-16 DIAGNOSIS — G4733 Obstructive sleep apnea (adult) (pediatric): Secondary | ICD-10-CM | POA: Diagnosis not present

## 2018-09-16 DIAGNOSIS — J449 Chronic obstructive pulmonary disease, unspecified: Secondary | ICD-10-CM | POA: Diagnosis not present

## 2018-09-20 ENCOUNTER — Ambulatory Visit: Payer: Self-pay

## 2018-09-20 DIAGNOSIS — R809 Proteinuria, unspecified: Secondary | ICD-10-CM

## 2018-09-20 DIAGNOSIS — I10 Essential (primary) hypertension: Secondary | ICD-10-CM

## 2018-09-20 DIAGNOSIS — E1129 Type 2 diabetes mellitus with other diabetic kidney complication: Secondary | ICD-10-CM

## 2018-09-20 DIAGNOSIS — G4733 Obstructive sleep apnea (adult) (pediatric): Secondary | ICD-10-CM

## 2018-09-20 NOTE — Patient Instructions (Addendum)
1. Continue to exercise three times a week utilizing [employer] 2. Continued to check your BP and blood glucose as directed. 3. Continue to take all medications as prescribed. 4. Follow up with all MD appointments 5. Notify the CCM Team as needed for additional needs  Goals Addressed    . COMPLETED: "I need help with my oxgen and insurance" (pt-stated)         Clinical Goal(s): Over the next 30 days, patient will understand plan of care related to night time oxygen use  Interventions: Spoke with [employer] representative Chrystal to determine process needed for scheduling overnight oximetry ON CPAP, OFF O2 to determine/requalify patient for nocturnal O2. Collaborated with PCP to obtain order and faxed to [place]. Patient notified  09/20/18 Notified PCP via note that overnight oximetry has been completed      . COMPLETED: Exercise 3x per week (30 min per time)         Clinical Goal(s): Patient will continue to utilize videos weekly provided by Garey Ham for her exercise  Interventions: Encouragement to continue exercise regimen and importance of exercise in management of chronic disease explained    CCM (Chronic Care Management) Team   Trish Fountain RN, BSN Nurse Care Coordinator  [phone]  Ruben Reason PharmD  Clinical Pharmacist  [phone]  The patient verbalized understanding of instructions provided today and declined a print copy of patient instruction materials.

## 2018-09-20 NOTE — Chronic Care Management (AMB) (Signed)
Chronic Care Management   Follow Up Note   09/20/2018 Name: STEPHAINE SANDIDGE MRN: [id number] DOB: July 10, 1946  Referred by: Alba Cory, MD Reason for referral : Chronic Care Management (follow up on oxygen)    Subjective: "I had a really good Thanksgiving with my family and I am feeling very well"   Objective:   Assessment: Mrs. Solivan is a 72 year old female primary care patient of Dr. Alba Cory. Mrs. Obryant has a medical history which includes dyslipidemia, asthma, DMII, CKD, and OSA.   Mrs. Hinnenkamp was referred to the [facility] Program for assistance with chronic disease management,medication management, and DME care coordination needs.   Ms Wainer was has been receiving monthly bills for the total amount of oxygen provided and told by her DME that her insurance was no longer covering her oxygen. Multiple calls to [employer] and [employer] and coordination of care efforts have been made to assist patient with resolution of claim denials.  Today Ms.Ballentine states she recently paid a reduced bill of 77.00 which included several months of service. According to Ms. Denner, this bill was resubmitted to her insurance company and as a result the total bill was reduced. She has not received another bill as of date.   Goals Addressed    . COMPLETED: "I need help with my oxgen and insurance" (pt-stated)       Mrs. Standing has had a recent overnight oximetry on CPAP off oxygen to determine her need for nocturnal oxygen and to provide documentation to insurance company of need. She is awaiting results to be provided by the her PCP.  Clinical Goal(s): Over the next 30 days, patient will understand plan of care related to night time oxygen use  Interventions: Spoke with [employer] representative Chrystal to determine process needed for scheduling overnight oximetry ON CPAP, OFF O2 to determine/requalify patient for nocturnal O2. Collaborated with PCP to obtain order and faxed to [employer]. Patient  notified  09/20/18 Notified PCP via note that overnight oximetry has been completed     . COMPLETED: Exercise 3x per week (30 min per time)       Patient continues to exercise as tolerated 3 x week utilizing her silver sneakers videos.  Clinical Goal(s): Patient will continue to utilize videos weekly provided by Philis Nettle for her exercise  Interventions: Encouragement to continue exercise regimen and importance of exercise in management of chronic disease explained     Plan: Will follow up with patient in 2 weeks.    Khary Schaben E. Suzie Portela, RN, BSN Nurse Care Coordinator [facility] / [facility] Care Management  [phone]

## 2018-09-21 DIAGNOSIS — R0681 Apnea, not elsewhere classified: Secondary | ICD-10-CM | POA: Diagnosis not present

## 2018-09-26 DIAGNOSIS — E042 Nontoxic multinodular goiter: Secondary | ICD-10-CM | POA: Diagnosis not present

## 2018-09-28 ENCOUNTER — Encounter: Payer: Self-pay | Admitting: Family Medicine

## 2018-10-04 ENCOUNTER — Other Ambulatory Visit: Payer: Self-pay

## 2018-10-04 ENCOUNTER — Ambulatory Visit: Payer: Medicare HMO | Admitting: Gastroenterology

## 2018-10-04 ENCOUNTER — Ambulatory Visit: Payer: Medicare HMO

## 2018-10-04 ENCOUNTER — Encounter: Payer: Self-pay | Admitting: Gastroenterology

## 2018-10-04 VITALS — BP 122/71 | HR 99 | Ht 62.0 in | Wt 271.8 lb

## 2018-10-04 DIAGNOSIS — G4733 Obstructive sleep apnea (adult) (pediatric): Secondary | ICD-10-CM

## 2018-10-04 DIAGNOSIS — E1122 Type 2 diabetes mellitus with diabetic chronic kidney disease: Secondary | ICD-10-CM

## 2018-10-04 DIAGNOSIS — I10 Essential (primary) hypertension: Secondary | ICD-10-CM

## 2018-10-04 DIAGNOSIS — D509 Iron deficiency anemia, unspecified: Secondary | ICD-10-CM

## 2018-10-04 DIAGNOSIS — N182 Chronic kidney disease, stage 2 (mild): Secondary | ICD-10-CM

## 2018-10-04 MED ORDER — TRIAMCINOLONE ACETONIDE 0.1 % EX CREA: 1.0000 "application " | TOPICAL_CREAM | CUTANEOUS | 5 refills | Status: DC

## 2018-10-04 NOTE — Progress Notes (Signed)
Primary Care Physician: Steele Sizer, MD  Primary Gastroenterologist:  Dr. Lucilla Lame  Chief Complaint  Patient presents with  . Follow up procedures    HPI: Amber Avery is a 72 y.o. female here for follow-up after having EGD.  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 The patient was seen a month ago by her hematologist to read some blood work on her and gave her 9 infusion because of the results.  The patient denies any black stools bloody stools or abdominal pain.  She states that her abdominal pain stopped after she stopped taking NSAIDs.  Current Outpatient Medications  Medication Sig Dispense Refill  . acetaminophen (TYLENOL) 500 MG tablet Take 1 tablet (500 mg total) by mouth every 6 (six) hours as needed. (Patient taking differently: Take 1,000 mg by mouth every 6 (six) hours as needed. ) 480 tablet 0  . albuterol (ACCUNEB) 1.25 MG/3ML nebulizer solution Take 3 mLs (1.25 mg total) by nebulization 2 (two) times daily. 75 mL 2  . allopurinol (ZYLOPRIM) 300 MG tablet Take 1 tablet (300 mg total) by mouth daily. 90 tablet 3  . aspirin 81 MG tablet Take 1 tablet by mouth daily.    [name] Kitchen atorvastatin (LIPITOR) 40 MG tablet Daily for cholesterol 90 tablet 3  . benazepril (LOTENSIN) 20 MG tablet Take 1 tablet (20 mg total) by mouth daily. 90 tablet 3  . Blood Glucose Monitoring Suppl (TRUE METRIX AIR GLUCOSE METER) w/Device KIT 1 each by Does not apply route 1 day or 1 dose. 1 kit 0  . budesonide (PULMICORT) 0.25 MG/2ML nebulizer solution Take 2 mLs (0.25 mg total) by nebulization 2 (two) times daily. 60 mL 2  . colchicine 0.6 MG tablet Take 1 tablet by mouth 2 (two) times daily. Takes as needed for flares    . diclofenac sodium (VOLTAREN) 1 % GEL Apply 4 g topically 4 (four) times daily. On both knees for OA 100 g 1  . furosemide (LASIX) 40 MG tablet TAKE 1 TABLET 2 TIMES DAILYAS NEEDED 90 tablet 3  . gabapentin (NEURONTIN) 300 MG capsule TAKE 1 CAPSULE  THREE TIMES DAILY 270 capsule 3  . glucose blood test strip Use as instructed 100 each 12  . Insulin Pen Needle 32G X 6 MM MISC 1 each by Does not apply route 4 (four) times daily. 200 each 2  . Lancets (ONETOUCH ULTRASOFT) lancets Use as instructed 100 each 12  . liraglutide (VICTOZA) 18 MG/3ML SOPN Inject 0.3 mLs (1.8 mg total) into the skin daily. 27 mL 1  . metFORMIN (GLUCOPHAGE) 500 MG tablet TAKE 1 TAB IN MORNING AND 2 TABS IN EVENING 270 tablet 3  . potassium chloride SA (K-DUR,KLOR-CON) 20 MEQ tablet Take 1-2 tablets (20-40 mEq total) by mouth daily. 180 tablet 3  . Vitamin D, Cholecalciferol, 1000 UNITS TABS Take 1 capsule by mouth daily.    [name] Kitchen triamcinolone cream (KENALOG) 0.1 % Apply 1 application topically as directed. 30 g 5   No current facility-administered medications for this visit.     Allergies as of 10/04/2018 - Review Complete 10/04/2018  Allergen Reaction Noted  . Codeine  02/22/2015    ROS:  General: Negative for anorexia, weight loss, fever, chills, fatigue, weakness. ENT: Negative for hoarseness, difficulty swallowing , nasal congestion. CV: Negative for chest pain, angina, palpitations, dyspnea on exertion, peripheral edema.  Respiratory: Negative for dyspnea at rest, dyspnea on exertion, cough, sputum, wheezing.  GI: See history of  present illness. GU:  Negative for dysuria, hematuria, urinary incontinence, urinary frequency, nocturnal urination.  Endo: Negative for unusual weight change.    Physical Examination:   BP 122/71   Pulse 99   Ht 5' 2"  (1.575 m)   Wt 271 lb 12.8 oz (123.3 kg)   BMI 49.71 kg/m   General: Well-nourished, well-developed in no acute distress.  Eyes: No icterus. Conjunctivae pink. Mouth: Oropharyngeal mucosa moist and pink , no lesions erythema or exudate. Extremities: No lower extremity edema. No clubbing or deformities. Neuro: Alert and oriented x 3.  Grossly intact. Skin: Warm and dry, no jaundice.   Psych: Alert and  cooperative, normal mood and affect.  Labs:    Imaging Studies: No results found.  Assessment and Plan:   Amber Avery is a 72 y.o. y/o female with chronic anemia and an angiodysplasia found on her EGD was treated with cautery. The patient reports feeling well without any complaints of present time.  The patient will have her blood sent off for a recheck of her iron studies and CBC.  If she continues to be anemic the patient has been told she will be set up for capsule endoscopy to look for any other AVMs in the small bowel.  The patient has been explained the plan and agrees with it.    Lucilla Lame, MD. [name]   Note: This dictation was prepared with Dragon dictation along with smaller phrase technology. Any transcriptional errors that result from this process are unintentional.

## 2018-10-04 NOTE — Chronic Care Management (AMB) (Signed)
Chronic Care Management   Note  10/04/2018 Name: Amber Avery MRN: [id number] DOB: Jun 13, 1946   Care Coordination: Successful telephone encounter to [name] for CCM follow up. Amber Avery is currently unavailable to speak at length about her healthcare goals. She is requesting assistance with prescription refill for triamcinolone ointment. States she has been told that prescription has been sent to pharmacy however pharmacy states prescription has not been refilled. Amber Avery is also requesting call back from PCP to discuss her recent overnight oximetry study.  Plan: Per patient request, will schedule a call back from [facility] RN CM tomorrow. Will notify PCP of patients requests as documented above.    Amber Avery E. Suzie Portela, RN, BSN Nurse Care Coordinator [facility] Care Management  [phone]

## 2018-10-04 NOTE — Telephone Encounter (Signed)
Refill request for general medication: Triamcinolone Cream 0.1%  Last office visit: 08/08/2018  Last physical exam: 07/01/2017   Follow-ups on file. 12/09/2018

## 2018-10-05 ENCOUNTER — Ambulatory Visit: Payer: Self-pay

## 2018-10-05 ENCOUNTER — Telehealth: Payer: Self-pay

## 2018-10-05 DIAGNOSIS — G4733 Obstructive sleep apnea (adult) (pediatric): Secondary | ICD-10-CM

## 2018-10-05 DIAGNOSIS — E1122 Type 2 diabetes mellitus with diabetic chronic kidney disease: Secondary | ICD-10-CM

## 2018-10-05 DIAGNOSIS — N182 Chronic kidney disease, stage 2 (mild): Secondary | ICD-10-CM

## 2018-10-05 LAB — CBC WITH DIFFERENTIAL/PLATELET
Basophils Absolute: 0.1 10*3/uL (ref 0.0–0.2)
Basos: 1 %
EOS (ABSOLUTE): 0.4 10*3/uL (ref 0.0–0.4)
Eos: 6 %
Hematocrit: 34.3 % (ref 34.0–46.6)
Hemoglobin: 11.1 g/dL (ref 11.1–15.9)
Immature Grans (Abs): 0 10*3/uL (ref 0.0–0.1)
Immature Granulocytes: 1 %
Lymphocytes Absolute: 1.4 10*3/uL (ref 0.7–3.1)
Lymphs: 21 %
MCH: 30.6 pg (ref 26.6–33.0)
MCHC: 32.4 g/dL (ref 31.5–35.7)
MCV: 95 fL (ref 79–97)
Monocytes Absolute: 0.6 10*3/uL (ref 0.1–0.9)
Monocytes: 10 %
Neutrophils Absolute: 3.9 10*3/uL (ref 1.4–7.0)
Neutrophils: 61 %
Platelets: 191 10*3/uL (ref 150–450)
RBC: 3.63 x10E6/uL — ABNORMAL LOW (ref 3.77–5.28)
RDW: 14.5 % (ref 12.3–15.4)
WBC: 6.3 10*3/uL (ref 3.4–10.8)

## 2018-10-05 LAB — IRON AND TIBC
Iron Saturation: 17 % (ref 15–55)
Iron: 62 ug/dL (ref 27–139)
Total Iron Binding Capacity: 355 ug/dL (ref 250–450)
UIBC: 293 ug/dL (ref 118–369)

## 2018-10-05 NOTE — Chronic Care Management (AMB) (Signed)
Erroneous encounter

## 2018-10-05 NOTE — Addendum Note (Signed)
Addended by: Trish Fountain E on: 10/05/2018 01:41 PM   Modules accepted: Orders

## 2018-10-05 NOTE — Chronic Care Management (AMB) (Signed)
Chronic Care Management   Note  10/05/2018 Name: Amber Avery MRN: [id number] DOB: 1946/08/25   [name] is a 72 year old female primary care patient of Dr. Alba Cory. [name] has a medical history which includes dyslipidemia, asthma, DMII, CKD, and OSA.   [name] was referred to the [facility] Program for assistance with chronic disease management,medication management, and DME care coordination needs. CCM RN CM briefly spoke with patient during scheduled telephone encounter however patient did not have time to discuss goal progression. [name] requested call back today.   Was unable to reach patient via telephone and have left HIPAA compliant voicemail asking patient to return my call. (unsuccessful outreach #1).  Plan: Will follow-up within 3-5  business days via telephone.     Ronnel Zuercher E. Suzie Portela, RN, BSN Nurse Care Coordinator [facility] / [facility] Care Management  [phone]

## 2018-10-06 ENCOUNTER — Other Ambulatory Visit: Payer: Self-pay | Admitting: Family Medicine

## 2018-10-06 DIAGNOSIS — L409 Psoriasis, unspecified: Secondary | ICD-10-CM

## 2018-10-06 DIAGNOSIS — E042 Nontoxic multinodular goiter: Secondary | ICD-10-CM | POA: Diagnosis not present

## 2018-10-06 NOTE — Telephone Encounter (Signed)
Pt was prescribed triamcinolone acetone 0.1% top cream. She prefers the ointment and would like a big tube of it. [facility]

## 2018-10-07 ENCOUNTER — Ambulatory Visit (INDEPENDENT_AMBULATORY_CARE_PROVIDER_SITE_OTHER): Payer: Medicare HMO

## 2018-10-07 ENCOUNTER — Telehealth: Payer: Self-pay

## 2018-10-07 DIAGNOSIS — I10 Essential (primary) hypertension: Secondary | ICD-10-CM | POA: Diagnosis not present

## 2018-10-07 DIAGNOSIS — N182 Chronic kidney disease, stage 2 (mild): Secondary | ICD-10-CM

## 2018-10-07 DIAGNOSIS — E1122 Type 2 diabetes mellitus with diabetic chronic kidney disease: Secondary | ICD-10-CM | POA: Diagnosis not present

## 2018-10-07 MED ORDER — TRIAMCINOLONE ACETONIDE 0.1 % EX OINT: TOPICAL_OINTMENT | CUTANEOUS | 1 refills | Status: DC

## 2018-10-07 NOTE — Patient Instructions (Addendum)
1. Please stop by the office next Thursday to pick up your pill box. 2. Enjoy your Holiday eating but remember foods that will run up your BP and Blood Sugars. Eat these foods in moderation. 3. You may be more successful with remember to monitor your BP and blood sugars if you place your monitors near your morning medications. [facility] Pharmacist will follow up with you next week to discuss your Mail order medications.  CCM (Chronic Care Management) Team   Trish Fountain RN, BSN Nurse Care Coordinator  [phone]  Ruben Reason PharmD  Clinical Pharmacist  [phone]  Goals Addressed            This Visit's Progress   . "I need to find out how to use [employer] mail order pharmacy for next year" (pt-stated)       Nurse Case Manager Clinical Goal(s): Over the next 14 days, patient will verbalize plan of care related to utilizing her mail order pharmacy benefits for 2020.  Interventions: Refer to [facility]    . "I want to keep doing good with my diabetes and blood pressure" (pt-stated)         Clinical Goal(s): Patient will monitor bp weekly and document, bringing her documentation to provider visits.   Interventions: Education previously provided re: long term management of HTN and DM;  Reinforced importance of long term self monitoring    10/07/09 Reinforced importance of BP and CBG monitoring and recording. Explored strategies that would prompt patient to check BP and CBG daily without forgetting. Discussed Holiday Eating and how it affects the blood sugars and blood pressures.

## 2018-10-07 NOTE — Chronic Care Management (AMB) (Signed)
   Chronic Care Management   Follow Up Note   10/07/2018 Name: Amber Avery MRN: [id number] DOB: Mar 06, 1946  Referred by: Alba Cory, MD Reason for referral : Chronic Care Management (Follow up )    Subjective: "I am doing pretty well but I would like you to help me move my medicines to mail order through [facility] for next year"   Objective:  Lab Results  Component Value Date   HGBA1C 6.6 08/08/2018   BP Readings from Last 3 Encounters:  10/04/18 122/71  09/09/18 112/78  09/01/18 110/69     Assessment: Amber Avery is a 72 year old female primary care patient of Dr. Alba Cory. Amber Avery has a medical history which includes dyslipidemia, asthma, DMII, CKD, and OSA.   Amber Avery was referred to the [facility] Program for assistance with chronic disease management,medication management, and DME care coordination needs. Today I follow up with Amber Avery to discuss progression towards goals  Goals Addressed    . "I need to find out how to use [employer] mail order pharmacy for next year" (pt-stated)       Amber Avery would like to reduce the cost of her medications in 2020. She is happy with her current retail pharmacy however she is aware that mail order through her [employer] Medicare benefits would significantly reduce the cost of her medications.  Nurse Case Manager Clinical Goal(s): Over the next 14 days, patient will verbalize plan of care related to utilizing her mail order pharmacy benefits for 2020.  Interventions: Refer to [facility]    . "I want to keep doing good with my diabetes and blood pressure" (pt-stated)       Amber Avery continues to monitor her BP and her blood sugar several times a week. Reports BP readings of 130s and 140s and blood glucose readings <130. She will go to her Amber Avery for Christmas but understands foods that will increase her blood sugars and blood pressure.   Clinical Goal(s): Patient will monitor bp weekly and document, bringing her  documentation to provider visits.   Interventions: Education previously provided re: long term management of HTN and DM;  Reinforced importance of long term self monitoring    10/07/09 Reinforced importance of BP and CBG monitoring and recording. Explored strategies that would prompt patient to check BP and CBG daily without forgetting. Discussed Holiday Eating and how it affects the blood sugars and blood pressures.        Plan: CCM Team will follow up with patient next week     Amber Avery E. Suzie Portela, RN, BSN Nurse Care Coordinator [facility] Care Management  [phone]

## 2018-10-07 NOTE — Telephone Encounter (Signed)
Pt notified of results

## 2018-10-07 NOTE — Telephone Encounter (Signed)
-----   Message from Lucilla Lame, MD sent at 10/06/2018  7:44 AM EST ----- Let the patient know that her iron levels and blood count are stable.  I would not recommend a capsule endoscopy unless her levels begin to drop.

## 2018-10-13 ENCOUNTER — Ambulatory Visit: Payer: Self-pay | Admitting: Pharmacist

## 2018-10-13 ENCOUNTER — Ambulatory Visit: Payer: Self-pay

## 2018-10-13 DIAGNOSIS — N182 Chronic kidney disease, stage 2 (mild): Principal | ICD-10-CM

## 2018-10-13 DIAGNOSIS — E1122 Type 2 diabetes mellitus with diabetic chronic kidney disease: Secondary | ICD-10-CM

## 2018-10-13 DIAGNOSIS — I10 Essential (primary) hypertension: Secondary | ICD-10-CM

## 2018-10-13 NOTE — Chronic Care Management (AMB) (Signed)
  Chronic Care Management   Note  10/13/2018 Name: RALONDA TARTT MRN: [id number] DOB: 1946/05/11   Outreach call placed to patient today to make arrangements for setting Ms. Bell up with pill boxes and mail order pharmacy benefits.    Was unable to reach patient via telephone today and have left HIPAA compliant voicemail asking patient to return my call. (unsuccessful outreach #1).  Plan: Will follow-up within 3-5  business days via telephone.   Ruben Reason, PharmD Clinical Pharmacist [facility] [phone]

## 2018-10-16 DIAGNOSIS — G4733 Obstructive sleep apnea (adult) (pediatric): Secondary | ICD-10-CM | POA: Diagnosis not present

## 2018-10-16 DIAGNOSIS — J449 Chronic obstructive pulmonary disease, unspecified: Secondary | ICD-10-CM | POA: Diagnosis not present

## 2018-10-20 ENCOUNTER — Ambulatory Visit (INDEPENDENT_AMBULATORY_CARE_PROVIDER_SITE_OTHER): Payer: Medicare HMO | Admitting: Pharmacist

## 2018-10-20 DIAGNOSIS — I10 Essential (primary) hypertension: Secondary | ICD-10-CM | POA: Diagnosis not present

## 2018-10-20 DIAGNOSIS — E1122 Type 2 diabetes mellitus with diabetic chronic kidney disease: Secondary | ICD-10-CM

## 2018-10-20 DIAGNOSIS — N182 Chronic kidney disease, stage 2 (mild): Secondary | ICD-10-CM

## 2018-10-20 NOTE — Chronic Care Management (AMB) (Signed)
Chronic Care Management   Follow Up Note   10/20/2018 Name: Amber Avery MRN: [id number] DOB: 07/30/1946  Referred by: Sowles, Krichna, MD Reason for referral : Chronic Care Management (Med management follow up)   Subjective: "I need to figure out my drug costs so that [facility] can price match them and I can get my bubble packs at [facility]"  Objective: Medications Reviewed Today    Reviewed by Million Maharaj E, RPH (Pharmacist) on 10/20/18 at 1130  Med List Status: <None>  Medication Order Taking? Sig Documenting Provider Last Dose Status Informant  acetaminophen (TYLENOL) 500 MG tablet [id number] Yes Take 1 tablet (500 mg total) by mouth every 6 (six) hours as needed.  Patient taking differently:  Take 1,000 mg by mouth every 8 (eight) hours.    Sowles, Krichna, MD Taking Active   albuterol (ACCUNEB) 1.25 MG/3ML nebulizer solution [id number] Yes Take 3 mLs (1.25 mg total) by nebulization 2 (two) times daily. Sowles, Krichna, MD Taking Active   allopurinol (ZYLOPRIM) 300 MG tablet [id number] Yes Take 1 tablet (300 mg total) by mouth daily. Sowles, Krichna, MD Taking Active   atorvastatin (LIPITOR) 40 MG tablet [id number] Yes Daily for cholesterol Sowles, Krichna, MD Taking Active   benazepril (LOTENSIN) 20 MG tablet [id number] Yes Take 1 tablet (20 mg total) by mouth daily. Sowles, Krichna, MD Taking Active   Blood Glucose Monitoring Suppl (TRUE METRIX AIR GLUCOSE METER) w/Device KIT [id number] Yes 1 each by Does not apply route 1 day or 1 dose. Sowles, Krichna, MD Taking Active   budesonide (PULMICORT) 0.25 MG/2ML nebulizer solution [id number] No Take 2 mLs (0.25 mg total) by nebulization 2 (two) times daily.  Patient not taking:  Reported on 10/20/2018   Sowles, Krichna, MD Not Taking Active   colchicine 0.6 MG tablet [id number] No Take 1 tablet by mouth 2 (two) times daily. Takes as needed for flares [provider] Not Taking Active Self           Med Note  (GRAVES, JAMIE C   Thu Apr 04, 2015  8:37 AM) Received from: [facility] Received Sig: Take by mouth.  diclofenac sodium (VOLTAREN) 1 % GEL [id number]  Apply 4 g topically 4 (four) times daily. On both knees for OA Sowles, Krichna, MD  Active   furosemide (LASIX) 40 MG tablet [id number] Yes TAKE 1 TABLET 2 TIMES DAILYAS NEEDED  Patient taking differently:  TAKE 1 TABLET 2 TIMES DAILY   Sowles, Krichna, MD Taking Active   gabapentin (NEURONTIN) 300 MG capsule [id number] Yes TAKE 1 CAPSULE THREE TIMES DAILY  Patient taking differently:  TAKE 1 CAPSULE THREE TIMES DAILY  1 in AM, 2 in PM   Sowles, Krichna, MD Taking Active   glucose blood test strip [id number]  Use as instructed Sowles, Krichna, MD  Active   Insulin Pen Needle 32G X 6 MM MISC [id number]  1 each by Does not apply route 4 (four) times daily. Sowles, Krichna, MD  Active            Med Note (Kytzia Gienger E   Thu Oct 20, 2018 11:28 AM) Trouble with copay  Lancets (ONETOUCH ULTRASOFT) lancets [id number]  Use as instructed Sowles, Krichna, MD  Active   liraglutide (VICTOZA) 18 MG/3ML SOPN [id number]  Inject 0.3 mLs (1.8 mg total) into the skin daily. Sowles, Krichna, MD  Active            Med Note (SANTOS, ELIZABETH J     Chronic Care Management   Follow Up Note   10/20/2018 Name: Amber Avery MRN: [id number] DOB: 07/30/1946  Referred by: Sowles, Krichna, MD Reason for referral : Chronic Care Management (Med management follow up)   Subjective: "I need to figure out my drug costs so that [facility] can price match them and I can get my bubble packs at [facility]"  Objective: Medications Reviewed Today    Reviewed by Hedrick, Julie E, RPH (Pharmacist) on 10/20/18 at 1130  Med List Status: <None>  Medication Order Taking? Sig Documenting Provider Last Dose Status Informant  acetaminophen (TYLENOL) 500 MG tablet [id number] Yes Take 1 tablet (500 mg total) by mouth every 6 (six) hours as needed.  Patient taking differently:  Take 1,000 mg by mouth every 8 (eight) hours.    Sowles, Krichna, MD Taking Active   albuterol (ACCUNEB) 1.25 MG/3ML nebulizer solution [id number] Yes Take 3 mLs (1.25 mg total) by nebulization 2 (two) times daily. Sowles, Krichna, MD Taking Active   allopurinol (ZYLOPRIM) 300 MG tablet [id number] Yes Take 1 tablet (300 mg total) by mouth daily. Sowles, Krichna, MD Taking Active   atorvastatin (LIPITOR) 40 MG tablet [id number] Yes Daily for cholesterol Sowles, Krichna, MD Taking Active   benazepril (LOTENSIN) 20 MG tablet [id number] Yes Take 1 tablet (20 mg total) by mouth daily. Sowles, Krichna, MD Taking Active   Blood Glucose Monitoring Suppl (TRUE METRIX AIR GLUCOSE METER) w/Device KIT [id number] Yes 1 each by Does not apply route 1 day or 1 dose. Sowles, Krichna, MD Taking Active   budesonide (PULMICORT) 0.25 MG/2ML nebulizer solution [id number] No Take 2 mLs (0.25 mg total) by nebulization 2 (two) times daily.  Patient not taking:  Reported on 10/20/2018   Sowles, Krichna, MD Not Taking Active   colchicine 0.6 MG tablet [id number] No Take 1 tablet by mouth 2 (two) times daily. Takes as needed for flares [provider] Not Taking Active Self           Med Note  (GRAVES, JAMIE C   Thu Apr 04, 2015  8:37 AM) Received from: [facility] Received Sig: Take by mouth.  diclofenac sodium (VOLTAREN) 1 % GEL [id number]  Apply 4 g topically 4 (four) times daily. On both knees for OA Sowles, Krichna, MD  Active   furosemide (LASIX) 40 MG tablet [id number] Yes TAKE 1 TABLET 2 TIMES DAILYAS NEEDED  Patient taking differently:  TAKE 1 TABLET 2 TIMES DAILY   Sowles, Krichna, MD Taking Active   gabapentin (NEURONTIN) 300 MG capsule [id number] Yes TAKE 1 CAPSULE THREE TIMES DAILY  Patient taking differently:  TAKE 1 CAPSULE THREE TIMES DAILY  1 in AM, 2 in PM   Sowles, Krichna, MD Taking Active   glucose blood test strip [id number]  Use as instructed Sowles, Krichna, MD  Active   Insulin Pen Needle 32G X 6 MM MISC [id number]  1 each by Does not apply route 4 (four) times daily. Sowles, Krichna, MD  Active            Med Note (HEDRICK, JULIE E   Thu Oct 20, 2018 11:28 AM) Trouble with copay  Lancets (ONETOUCH ULTRASOFT) lancets [id number]  Use as instructed Sowles, Krichna, MD  Active   liraglutide (VICTOZA) 18 MG/3ML SOPN [id number]  Inject 0.3 mLs (1.8 mg total) into the skin daily. Sowles, Krichna, MD  Active            Med Note (SANTOS, ELIZABETH J     Chronic Care Management   Follow Up Note   10/20/2018 Name: Amber Avery MRN: [id number] DOB: 07/30/1946  Referred by: Sowles, Krichna, MD Reason for referral : Chronic Care Management (Med management follow up)   Subjective: "I need to figure out my drug costs so that [facility] can price match them and I can get my bubble packs at [facility]"  Objective: Medications Reviewed Today    Reviewed by ,  E, RPH (Pharmacist) on 10/20/18 at 1130  Med List Status: <None>  Medication Order Taking? Sig Documenting Provider Last Dose Status Informant  acetaminophen (TYLENOL) 500 MG tablet [id number] Yes Take 1 tablet (500 mg total) by mouth every 6 (six) hours as needed.  Patient taking differently:  Take 1,000 mg by mouth every 8 (eight) hours.    Sowles, Krichna, MD Taking Active   albuterol (ACCUNEB) 1.25 MG/3ML nebulizer solution [id number] Yes Take 3 mLs (1.25 mg total) by nebulization 2 (two) times daily. Sowles, Krichna, MD Taking Active   allopurinol (ZYLOPRIM) 300 MG tablet [id number] Yes Take 1 tablet (300 mg total) by mouth daily. Sowles, Krichna, MD Taking Active   atorvastatin (LIPITOR) 40 MG tablet [id number] Yes Daily for cholesterol Sowles, Krichna, MD Taking Active   benazepril (LOTENSIN) 20 MG tablet [id number] Yes Take 1 tablet (20 mg total) by mouth daily. Sowles, Krichna, MD Taking Active   Blood Glucose Monitoring Suppl (TRUE METRIX AIR GLUCOSE METER) w/Device KIT [id number] Yes 1 each by Does not apply route 1 day or 1 dose. Sowles, Krichna, MD Taking Active   budesonide (PULMICORT) 0.25 MG/2ML nebulizer solution [id number] No Take 2 mLs (0.25 mg total) by nebulization 2 (two) times daily.  Patient not taking:  Reported on 10/20/2018   Sowles, Krichna, MD Not Taking Active   colchicine 0.6 MG tablet [id number] No Take 1 tablet by mouth 2 (two) times daily. Takes as needed for flares [provider] Not Taking Active Self           Med Note  (GRAVES, JAMIE C   Thu Apr 04, 2015  8:37 AM) Received from: [facility] Received Sig: Take by mouth.  diclofenac sodium (VOLTAREN) 1 % GEL [id number]  Apply 4 g topically 4 (four) times daily. On both knees for OA Sowles, Krichna, MD  Active   furosemide (LASIX) 40 MG tablet [id number] Yes TAKE 1 TABLET 2 TIMES DAILYAS NEEDED  Patient taking differently:  TAKE 1 TABLET 2 TIMES DAILY   Sowles, Krichna, MD Taking Active   gabapentin (NEURONTIN) 300 MG capsule [id number] Yes TAKE 1 CAPSULE THREE TIMES DAILY  Patient taking differently:  TAKE 1 CAPSULE THREE TIMES DAILY  1 in AM, 2 in PM   Sowles, Krichna, MD Taking Active   glucose blood test strip [id number]  Use as instructed Sowles, Krichna, MD  Active   Insulin Pen Needle 32G X 6 MM MISC [id number]  1 each by Does not apply route 4 (four) times daily. Sowles, Krichna, MD  Active            Med Note (,  E   Thu Oct 20, 2018 11:28 AM) Trouble with copay  Lancets (ONETOUCH ULTRASOFT) lancets [id number]  Use as instructed Sowles, Krichna, MD  Active   liraglutide (VICTOZA) 18 MG/3ML SOPN [id number]  Inject 0.3 mLs (1.8 mg total) into the skin daily. Sowles, Krichna, MD  Active            Med Note (SANTOS, ELIZABETH J

## 2018-10-20 NOTE — Patient Instructions (Signed)
Thank you for allowing the CCM team to be a part of your care!   Continue to utilize your bubble packs for excellent medication adherence.   Please call a member of the CCM (Chronic Care Management) Team with any questions or case management needs:   Vanetta Mulders, BSN Nurse Care Coordinator  [phone]  Ruben Reason, PharmD  Clinical Pharmacist  [phone]   Goals Addressed            This Visit's Progress   . COMPLETED: "I need to find out how to use [employer]'s mail order pharmacy for next year" (pt-stated)       Nurse Case Manager Clinical Goal(s): Over the next 14 days, patient will verbalize plan of care related to utilizing her mail order pharmacy benefits for 2020.  Interventions: Notified  CCM Pharmacy    . "I want to use pill packs to keep track of my medicine" (pt-stated)        Clinical Goal(s): Over the next 90 days, Ms. Swaney will be adherent to all medications as evidenced by a percentage of days covered [facility]) report of >80%.   Interventions:  - completed medication review - compared medication benefits under patient's Medicare Advantage plan (with printout) for patient to provide to Dian Situ at [facility]        The patient verbalized understanding of instructions provided today and declined a print copy of patient instruction materials.

## 2018-11-05 ENCOUNTER — Other Ambulatory Visit: Payer: Self-pay | Admitting: Family Medicine

## 2018-11-05 DIAGNOSIS — M170 Bilateral primary osteoarthritis of knee: Secondary | ICD-10-CM

## 2018-11-09 ENCOUNTER — Other Ambulatory Visit: Payer: Self-pay

## 2018-11-09 DIAGNOSIS — M170 Bilateral primary osteoarthritis of knee: Secondary | ICD-10-CM

## 2018-11-09 MED ORDER — DICLOFENAC SODIUM 1 % TD GEL: TRANSDERMAL | 1 refills | Status: DC

## 2018-11-09 NOTE — Telephone Encounter (Signed)
Prior Authorization was initiated and approved for Diclofenac Sodium. Referral Number: [id number]. The period of coverage for this medication will be effective: 10/17/2018-10/19/2019.

## 2018-11-16 DIAGNOSIS — J449 Chronic obstructive pulmonary disease, unspecified: Secondary | ICD-10-CM | POA: Diagnosis not present

## 2018-11-16 DIAGNOSIS — G4733 Obstructive sleep apnea (adult) (pediatric): Secondary | ICD-10-CM | POA: Diagnosis not present

## 2018-12-08 ENCOUNTER — Encounter: Payer: Self-pay | Admitting: Family Medicine

## 2018-12-08 ENCOUNTER — Ambulatory Visit (INDEPENDENT_AMBULATORY_CARE_PROVIDER_SITE_OTHER): Payer: Medicare HMO | Admitting: Family Medicine

## 2018-12-08 VITALS — BP 110/68 | HR 69 | Temp 98.1°F | Resp 16 | Ht 62.0 in | Wt 270.0 lb

## 2018-12-08 DIAGNOSIS — E1122 Type 2 diabetes mellitus with diabetic chronic kidney disease: Secondary | ICD-10-CM

## 2018-12-08 DIAGNOSIS — M170 Bilateral primary osteoarthritis of knee: Secondary | ICD-10-CM

## 2018-12-08 DIAGNOSIS — G4733 Obstructive sleep apnea (adult) (pediatric): Secondary | ICD-10-CM | POA: Diagnosis not present

## 2018-12-08 DIAGNOSIS — E785 Hyperlipidemia, unspecified: Secondary | ICD-10-CM

## 2018-12-08 DIAGNOSIS — E1129 Type 2 diabetes mellitus with other diabetic kidney complication: Secondary | ICD-10-CM | POA: Diagnosis not present

## 2018-12-08 DIAGNOSIS — J4530 Mild persistent asthma, uncomplicated: Secondary | ICD-10-CM

## 2018-12-08 DIAGNOSIS — Z79899 Other long term (current) drug therapy: Secondary | ICD-10-CM

## 2018-12-08 DIAGNOSIS — M5416 Radiculopathy, lumbar region: Secondary | ICD-10-CM | POA: Diagnosis not present

## 2018-12-08 DIAGNOSIS — D509 Iron deficiency anemia, unspecified: Secondary | ICD-10-CM

## 2018-12-08 DIAGNOSIS — E1140 Type 2 diabetes mellitus with diabetic neuropathy, unspecified: Secondary | ICD-10-CM

## 2018-12-08 DIAGNOSIS — R198 Other specified symptoms and signs involving the digestive system and abdomen: Secondary | ICD-10-CM

## 2018-12-08 DIAGNOSIS — N182 Chronic kidney disease, stage 2 (mild): Secondary | ICD-10-CM | POA: Diagnosis not present

## 2018-12-08 DIAGNOSIS — I10 Essential (primary) hypertension: Secondary | ICD-10-CM | POA: Diagnosis not present

## 2018-12-08 DIAGNOSIS — R809 Proteinuria, unspecified: Secondary | ICD-10-CM

## 2018-12-08 LAB — POCT GLYCOSYLATED HEMOGLOBIN (HGB A1C): HbA1c, POC (controlled diabetic range): 6.3 % (ref 0.0–7.0)

## 2018-12-08 MED ORDER — DICLOFENAC SODIUM 1 % TD GEL: TRANSDERMAL | 2 refills | Status: DC

## 2018-12-08 MED ORDER — GABAPENTIN 300 MG PO CAPS: ORAL_CAPSULE | ORAL | 2 refills | Status: DC

## 2018-12-08 NOTE — Progress Notes (Signed)
Name: Amber Avery   MRN: [id number]    DOB: 08-30-1946   Date:12/08/2018       Progress Note  Subjective  Chief Complaint  Chief Complaint  Patient presents with  . Diabetes    Wants to discuss changing Victoza to save her money by changing to [place]  . Hypertension    Dizzy today-has not ate today  . Asthma    Well controlled  . Dyslipidemia  . Medication Refill    4 month F/U, wants to check her Vitamin B  . Tingling    Has a burning sensation at night in her legs that makes her throw her covers off. Check b12 Level     HPI  Dyslipidemia: taking statin therapy, no side effects, no chest pain or myalgia  AsthmaMild intermittent: she states she is doing much better since she started showering daily, she has a cough daily, but only during her shower, no wheezing or SOB. She uses albuterol very seldom. Does not need a refill.   DM with renal manifestation: HgbA1C was down to 6.9%, 7.2,,7.0%%,6.3%,6.1%,6.5%, 6.6% today is down to 6.3% She is on metformin, and Victoza since 11/2016.She denies polyphagia, polydipsia or polyuria, but since recent URI she has noticed that has to change pads more often, discussed PT but she wants to hold off at this time. Eye exam is up to date On Ace for [facility] used to have proteinuria, but normal urine micro. She has noticed some more tingling on both lower legs over the past months. She is on neurontin and we will increase dose, since she is on metformin we will check B12 levels.   Obesity: shelost 2 lbs since last visit, she has a fit bit since Christmas, gift to self, she has been walking more daily, and is up to 3000 most days, advised to try going up to 4000 , sometimes cannot be as active because of knee pain  Change in bowel movements: she has noticed bowel movements not daily but when present it is 6-7 bristol scale. No blood in stools, no mucus. We will send her back to GI  OSA: she wear machine every night, she has a new  mask and uses oxygen with CPAP.She is doing well on new mask. On oxygen but concerned about cost. Over $90 per month, low pulse ox below 88 for almost 3 minutes.   Right thyroid nodule; seen by Endo at [facility], biopsy done and negative. She is due for follow up, last visit was Dec 2019  Varicose Veins: present for years and now has pain on left groin and upper thigh, bulging varicose. Seen by Vascular Surgeon but procedure will not be covered. Edema is very mild today , doing better with daily walk  Gait instability: she uses a cane,they moved mailbox near her house and is doing well. Still has pain on both knees from OA.She has a walk in shower and a ramp now and it has helped her to be more independent, she states the instability is because her knee gives out at times States when very sick in Dec had to use [place] for 4 days, unable to walk .  OA : shesaw Ortho , Dr. Candelaria Stagers, and he advised her not to have surgery because of her weight, she had one round of hyaluronan injection without help and it was too expensive . She is still taking Tylenol , pain level is 3/10  right now ,  however as the day progresses and she is  moving around the pain can go up to 8/10. She has effusion when more active, no redness or increase in warmth. She has a hoveround, uses walker and sometimes cane, she would like to have a handicap placard.  Iron deficiency anemia: seeing Dr. Tasia Catchings, and find rounds of iron infusion, is going to EGD done by Dr. Allen Norris done soon. Last hemoglobin is back to normal . She would like to get labs done today and see if she can skip visit with Dr. Tasia Catchings, advised to keep follow up or ask her about it.   Patient Active Problem List   Diagnosis Date Noted  . Angiodysplasia of stomach and duodenum   . Acute gastritis with hemorrhage   . Iron deficiency anemia 04/20/2018  . Anemia, unspecified 04/12/2017  . Lichen sclerosus of female genitalia 06/29/2016  . Primary osteoarthritis of both  knees 04/07/2016  . Special screening for malignant neoplasms, colon   . Right thyroid nodule 06/26/2015  . Asthma, intermittent 04/03/2015  . Benign essential HTN 04/03/2015  . Carpal tunnel syndrome 04/03/2015  . Chronic kidney disease (CKD), stage II (mild) 04/03/2015  . Controlled gout 04/03/2015  . Arteriosclerosis of coronary artery 04/03/2015  . Diabetes mellitus with renal manifestations, controlled ([place]) 04/03/2015  . Dyslipidemia 04/03/2015  . Edema extremities 04/03/2015  . Elevated sedimentation rate 04/03/2015  . Knee pain 04/03/2015  . Lumbar radiculitis 04/03/2015  . Obstructive apnea 04/03/2015  . Lumbosacral spondylosis 04/03/2015  . Osteoarthritis, chronic 04/03/2015  . Psoriasis 04/03/2015  . Vitamin D deficiency 04/03/2015  . Varicose veins 04/03/2015  . Goiter 12/23/2014  . Degeneration of intervertebral disc of lumbar region 08/23/2014  . Corns and callosity 06/11/2009  . Extreme obesity 04/11/2007    Past Surgical History:  Procedure Laterality Date  . APPENDECTOMY    . CHOLECYSTECTOMY  1977  . COLONOSCOPY WITH PROPOFOL N/A 01/21/2016   Procedure: COLONOSCOPY WITH PROPOFOL;  Surgeon: Lucilla Lame, MD;  Location: [facility] ENDOSCOPY;  Service: Endoscopy;  Laterality: N/A;  . DILATION AND CURETTAGE OF UTERUS     Due to Amenorrhea  . ESOPHAGOGASTRODUODENOSCOPY (EGD) WITH PROPOFOL N/A 08/16/2018   Procedure: ESOPHAGOGASTRODUODENOSCOPY (EGD) WITH PROPOFOL;  Surgeon: Lucilla Lame, MD;  Location: [facility] ENDOSCOPY;  Service: Endoscopy;  Laterality: N/A;  . [place]  . HEMORROIDECTOMY    . HERNIA REPAIR  2011  . Temporal Area Excision Biopsy     For Birth Mark Changes, Negative Pathology  . TONSILLECTOMY AND ADENOIDECTOMY      Family History  Problem Relation Age of Onset  . Cancer Mother        brain tumor  . Kidney disease Mother   . Hypertension Mother   . Heart disease Father   . COPD Father   . Hypertension Sister   . Arthritis Sister    . Cancer Sister   . GER disease Sister   . Lupus Sister   . Diabetes Sister   . Arthritis Sister   . Osteopenia Sister   . Heart disease Sister   . Hypertension Sister   . Breast cancer Maternal Aunt 70  . Leukemia Maternal Aunt     Social History   Socioeconomic History  . Marital status: Divorced    Spouse name: Not on file  . Number of children: 0  . Years of education: Not on file  . Highest education level: 12th grade  Occupational History  . Occupation: Retired  Scientific laboratory technician  . Financial resource strain: Not hard at all  .  Food insecurity:    Worry: Never true    Inability: Never true  . Transportation needs:    Medical: No    Non-medical: No  Tobacco Use  . Smoking status: Former Smoker    Packs/day: 2.00    Years: 15.00    Pack years: 30.00    Types: Cigarettes    Last attempt to quit: 1983    Years since quitting: 37.1  . Smokeless tobacco: Never Used  . Tobacco comment: smoking cessation materials not required  Substance and Sexual Activity  . Alcohol use: Not Currently    Alcohol/week: 0.0 standard drinks  . Drug use: No  . Sexual activity: Not Currently  Lifestyle  . Physical activity:    Days per week: 0 days    Minutes per session: 0 min  . Stress: Not at all  Relationships  . Social connections:    Talks on phone: Patient refused    Gets together: Patient refused    Attends religious service: Patient refused    Active member of club or organization: Patient refused    Attends meetings of clubs or organizations: Patient refused    Relationship status: Divorced  . Intimate partner violence:    Fear of current or ex partner: No    Emotionally abused: No    Physically abused: No    Forced sexual activity: No  Other Topics Concern  . Not on file  Social History Narrative  . Not on file     Current Outpatient Medications:  .  acetaminophen (TYLENOL) 500 MG tablet, Take 1 tablet (500 mg total) by mouth every 6 (six) hours as needed.  (Patient taking differently: Take 1,000 mg by mouth every 8 (eight) hours. ), Disp: 480 tablet, Rfl: 0 .  albuterol (ACCUNEB) 1.25 MG/3ML nebulizer solution, Take 3 mLs (1.25 mg total) by nebulization 2 (two) times daily., Disp: 75 mL, Rfl: 2 .  allopurinol (ZYLOPRIM) 300 MG tablet, Take 1 tablet (300 mg total) by mouth daily., Disp: 90 tablet, Rfl: 3 .  atorvastatin (LIPITOR) 40 MG tablet, Daily for cholesterol, Disp: 90 tablet, Rfl: 3 .  benazepril (LOTENSIN) 20 MG tablet, Take 1 tablet (20 mg total) by mouth daily., Disp: 90 tablet, Rfl: 3 .  Blood Glucose Monitoring Suppl (TRUE METRIX AIR GLUCOSE METER) w/Device KIT, 1 each by Does not apply route 1 day or 1 dose., Disp: 1 kit, Rfl: 0 .  budesonide (PULMICORT) 0.25 MG/2ML nebulizer solution, Take 2 mLs (0.25 mg total) by nebulization 2 (two) times daily., Disp: 60 mL, Rfl: 2 .  colchicine 0.6 MG tablet, Take 1 tablet by mouth 2 (two) times daily. Takes as needed for flares, Disp: , Rfl:  .  diclofenac sodium (VOLTAREN) 1 % GEL, APPLY 4 GRAMS TOPICALLY TO BOTH KNEES 4 TIMES DAILY FOR OSTEOARTHRITIS, Disp: 100 g, Rfl: 1 .  furosemide (LASIX) 40 MG tablet, TAKE 1 TABLET 2 TIMES DAILYAS NEEDED (Patient taking differently: TAKE 1 TABLET 2 TIMES DAILY), Disp: 90 tablet, Rfl: 3 .  gabapentin (NEURONTIN) 300 MG capsule, TAKE 1 CAPSULE THREE TIMES DAILY (Patient taking differently: TAKE 1 CAPSULE THREE TIMES DAILY  1 in AM, 2 in PM), Disp: 270 capsule, Rfl: 3 .  glucose blood test strip, Use as instructed, Disp: 100 each, Rfl: 12 .  Insulin Pen Needle 32G X 6 MM MISC, 1 each by Does not apply route 4 (four) times daily., Disp: 200 each, Rfl: 2 .  Lancets (ONETOUCH ULTRASOFT) lancets, Use as instructed, Disp:  100 each, Rfl: 12 .  liraglutide (VICTOZA) 18 MG/3ML SOPN, Inject 0.3 mLs (1.8 mg total) into the skin daily. (Patient taking differently: Inject 0.6 mg into the skin daily. ), Disp: 27 mL, Rfl: 1 .  metFORMIN (GLUCOPHAGE) 500 MG tablet, TAKE 1 TAB IN  MORNING AND 2 TABS IN EVENING, Disp: 270 tablet, Rfl: 3 .  potassium chloride SA (K-DUR,KLOR-CON) 20 MEQ tablet, Take 1-2 tablets (20-40 mEq total) by mouth daily., Disp: 180 tablet, Rfl: 3 .  triamcinolone ointment (KENALOG) 0.1 %, APPLY A PEA SIZE AMOUNT EXTERNALLY TO AREA EVERY OTHER DAY, Disp: 90 g, Rfl: 1 .  Vitamin D, Cholecalciferol, 1000 UNITS TABS, Take 1 capsule by mouth daily., Disp: , Rfl:   Allergies  Allergen Reactions  . Codeine     I personally reviewed active problem list, medication list, allergies, family history, social history with the patient/caregiver today.   ROS  Constitutional: Negative for fever or weight change.  Respiratory: Negative for cough and shortness of breath.   Cardiovascular: Negative for chest pain or palpitations.  Gastrointestinal: Negative for abdominal pain, no bowel changes.  Musculoskeletal: positive  for gait problem and intermittent  joint swelling.  Skin: Negative for rash.  Neurological: Negative for dizziness or headache.  No other specific complaints in a complete review of systems (except as listed in HPI above).  Objective  Vitals:   12/08/18 1113  BP: 110/68  Pulse: 69  Resp: 16  Temp: 98.1 F (36.7 C)  TempSrc: Oral  SpO2: 97%  Weight: 270 lb (122.5 kg)  Height: 5' 2"  (1.575 m)    Body mass index is 49.38 kg/m.  Physical Exam  Constitutional: Patient appears well-developed and well-nourished. Obese  No distress.  HEENT: head atraumatic, normocephalic, pupils equal and reactive to light,neck supple, throat within normal limits Cardiovascular: Normal rate, regular rhythm and normal heart sounds.  No murmur heard. No BLE edema. Pulmonary/Chest: Effort normal and breath sounds normal. No respiratory distress. Abdominal: Soft.  There is no tenderness. Muscular Skeletal: crepitus with extension of right knee, uses a cane  Psychiatric: Patient has a normal mood and affect. behavior is normal. Judgment and thought  content normal.  Recent Results (from the past 2160 hour(s))  CBC with Differential/Platelet     Status: Abnormal   Collection Time: 10/04/18  3:36 PM  Result Value Ref Range   WBC 6.3 3.4 - 10.8 x10E3/uL   RBC 3.63 (L) 3.77 - 5.28 x10E6/uL   Hemoglobin 11.1 11.1 - 15.9 g/dL   Hematocrit 34.3 34.0 - 46.6 %   MCV 95 79 - 97 fL   MCH 30.6 26.6 - 33.0 pg   MCHC 32.4 31.5 - 35.7 g/dL   RDW 14.5 12.3 - 15.4 %    Comment: **Effective October 24, 2018, the RDW pediatric reference**   interval will be removed and the adult reference interval   will be changing to:                             Female 11.7 - 15.4                                                      Female 11.6 - 15.4    Platelets 191 150 - 450 x10E3/uL   Neutrophils  61 Not Estab. %   Lymphs 21 Not Estab. %   Monocytes 10 Not Estab. %   Eos 6 Not Estab. %   Basos 1 Not Estab. %   Neutrophils Absolute 3.9 1.4 - 7.0 x10E3/uL   Lymphocytes Absolute 1.4 0.7 - 3.1 x10E3/uL   Monocytes Absolute 0.6 0.1 - 0.9 x10E3/uL   EOS (ABSOLUTE) 0.4 0.0 - 0.4 x10E3/uL   Basophils Absolute 0.1 0.0 - 0.2 x10E3/uL   Immature Granulocytes 1 Not Estab. %   Immature Grans (Abs) 0.0 0.0 - 0.1 x10E3/uL  Iron and TIBC     Status: None   Collection Time: 10/04/18  3:36 PM  Result Value Ref Range   Total Iron Binding Capacity 355 250 - 450 ug/dL   UIBC 293 118 - 369 ug/dL   Iron 62 27 - 139 ug/dL   Iron Saturation 17 15 - 55 %  POCT HgB A1C     Status: Normal   Collection Time: 12/08/18 11:22 AM  Result Value Ref Range   Hemoglobin A1C     HbA1c POC (<> result, manual entry)     HbA1c, POC (prediabetic range)     HbA1c, POC (controlled diabetic range) 6.3 0.0 - 7.0 %    D PHQ2/9: Depression screen [facility] 2/9 08/08/2018 06/17/2018 06/15/2018 04/11/2018 07/01/2017  Decreased Interest 0 0 0 0 0  Down, Depressed, Hopeless 0 0 0 0 0  PHQ - 2 Score 0 0 0 0 0  Altered sleeping 1 0 - 1 -  Tired, decreased energy 2 0 - 1 -  Change in appetite 0 0 - 0 -   Feeling bad or failure about yourself  0 0 - 0 -  Trouble concentrating 0 0 - 1 -  Moving slowly or fidgety/restless 0 0 - 0 -  Suicidal thoughts 0 0 - 0 -  PHQ-9 Score 3 0 - 3 -  Difficult doing work/chores - Not difficult at all - Not difficult at all -     Fall Risk: Fall Risk  12/08/2018 09/09/2018 08/08/2018 06/17/2018 05/11/2018  Falls in the past year? 0 0 No No No  Number falls in past yr: 0 - - - -  Injury with Fall? 0 - - - -  Risk for fall due to : - - - Impaired vision;Impaired balance/gait -  Risk for fall due to: Comment - - - wears eyeglasses; ambuates with cane or rolling walker -     Functional Status Survey: Is the patient deaf or have difficulty hearing?: No Does the patient have difficulty seeing, even when wearing glasses/contacts?: Yes Does the patient have difficulty concentrating, remembering, or making decisions?: No Does the patient have difficulty walking or climbing stairs?: Yes Does the patient have difficulty dressing or bathing?: No Does the patient have difficulty doing errands alone such as visiting a doctor's office or shopping?: No    Assessment & Plan  1. Controlled type 2 diabetes mellitus with stage 2 chronic kidney disease, unspecified whether long term insulin use (HCC)  - POCT HgB A1C  2. Lumbar back pain with radiculopathy affecting left lower extremity  - gabapentin (NEURONTIN) 300 MG capsule; 1 in am, 1 in pm and 2 at night  Dispense: 360 capsule; Refill: 2  3. Bilateral primary osteoarthritis of knee  - diclofenac sodium (VOLTAREN) 1 % GEL; APPLY 4 GRAMS TOPICALLY TO BOTH KNEES 4 TIMES DAILY FOR OSTEOARTHRITIS  Dispense: 100 g; Refill: 2  4. Type 2 diabetes mellitus with microalbuminuria,  without long-term current use of insulin ([place])  We will discuss with Ruben Reason the cost of Victoza and see if any other options for her  5. Benign essential HTN  - COMPLETE METABOLIC PANEL WITH GFR  6. Well controlled mild persistent  asthma  Well control   7. Obstructive apnea  She states oxygen is very expensive, study showed pulse ox 88% for almost 3 minutes during the night   8. Morbid obesity due to excess calories [facility])  Discussed with the patient the risk posed by an increased BMI. Discussed importance of portion control, calorie counting and at least 150 minutes of physical activity weekly. Avoid sweet beverages and drink more water. Eat at least 6 servings of fruit and vegetables daily   9. Iron deficiency anemia, unspecified iron deficiency anemia type  - CBC with Differential/Platelet - Iron, TIBC and Ferritin Panel  10. Dyslipidemia  - Lipid panel  11. Long-term use of high-risk medication  - B12 and also tingling legs   12. Change in bowel movement  - Ambulatory referral to Gastroenterology

## 2018-12-09 ENCOUNTER — Ambulatory Visit: Payer: Medicare HMO | Admitting: Family Medicine

## 2018-12-09 ENCOUNTER — Other Ambulatory Visit: Payer: Self-pay | Admitting: Family Medicine

## 2018-12-09 LAB — COMPLETE METABOLIC PANEL WITHOUT GFR
AG Ratio: 1.8 (calc) (ref 1.0–2.5)
ALT: 26 U/L (ref 6–29)
AST: 27 U/L (ref 10–35)
Albumin: 4.3 g/dL (ref 3.6–5.1)
Alkaline phosphatase (APISO): 94 U/L (ref 37–153)
BUN/Creatinine Ratio: 38 (calc) — ABNORMAL HIGH (ref 6–22)
BUN: 40 mg/dL — ABNORMAL HIGH (ref 7–25)
CO2: 23 mmol/L (ref 20–32)
Calcium: 9.7 mg/dL (ref 8.6–10.4)
Chloride: 105 mmol/L (ref 98–110)
Creat: 1.06 mg/dL — ABNORMAL HIGH (ref 0.60–0.93)
GFR, Est African American: 61 mL/min/{1.73_m2}
GFR, Est Non African American: 53 mL/min/{1.73_m2} — ABNORMAL LOW
Globulin: 2.4 g/dL (ref 1.9–3.7)
Glucose, Bld: 96 mg/dL (ref 65–99)
Potassium: 4.7 mmol/L (ref 3.5–5.3)
Sodium: 140 mmol/L (ref 135–146)
Total Bilirubin: 0.6 mg/dL (ref 0.2–1.2)
Total Protein: 6.7 g/dL (ref 6.1–8.1)

## 2018-12-09 LAB — CBC WITH DIFFERENTIAL/PLATELET
Absolute Monocytes: 812 {cells}/uL (ref 200–950)
Basophils Absolute: 74 {cells}/uL (ref 0–200)
Basophils Relative: 0.9 %
Eosinophils Absolute: 1238 {cells}/uL — ABNORMAL HIGH (ref 15–500)
Eosinophils Relative: 15.1 %
HCT: 34.7 % — ABNORMAL LOW (ref 35.0–45.0)
Hemoglobin: 11.7 g/dL (ref 11.7–15.5)
Lymphs Abs: 1615 {cells}/uL (ref 850–3900)
MCH: 31.1 pg (ref 27.0–33.0)
MCHC: 33.7 g/dL (ref 32.0–36.0)
MCV: 92.3 fL (ref 80.0–100.0)
MPV: 11.7 fL (ref 7.5–12.5)
Monocytes Relative: 9.9 %
Neutro Abs: 4461 {cells}/uL (ref 1500–7800)
Neutrophils Relative %: 54.4 %
Platelets: 225 10*3/uL (ref 140–400)
RBC: 3.76 Million/uL — ABNORMAL LOW (ref 3.80–5.10)
RDW: 14.4 % (ref 11.0–15.0)
Total Lymphocyte: 19.7 %
WBC: 8.2 10*3/uL (ref 3.8–10.8)

## 2018-12-09 LAB — VITAMIN B12: Vitamin B-12: 354 pg/mL (ref 200–1100)

## 2018-12-09 LAB — IRON,TIBC AND FERRITIN PANEL
%SAT: 15 % — ABNORMAL LOW (ref 16–45)
Ferritin: 109 ng/mL (ref 16–288)
Iron: 60 ug/dL (ref 45–160)
TIBC: 390 ug/dL (ref 250–450)

## 2018-12-09 LAB — LIPID PANEL
Cholesterol: 134 mg/dL
HDL: 42 mg/dL — ABNORMAL LOW
LDL Cholesterol (Calc): 66 mg/dL
Non-HDL Cholesterol (Calc): 92 mg/dL
Total CHOL/HDL Ratio: 3.2 (calc)
Triglycerides: 179 mg/dL — ABNORMAL HIGH

## 2018-12-09 MED ORDER — DULAGLUTIDE 1.5 MG/0.5ML ~~LOC~~ SOAJ: 1.5000 mg | SUBCUTANEOUS | 3 refills | Status: DC

## 2018-12-17 DIAGNOSIS — G4733 Obstructive sleep apnea (adult) (pediatric): Secondary | ICD-10-CM | POA: Diagnosis not present

## 2018-12-17 DIAGNOSIS — J449 Chronic obstructive pulmonary disease, unspecified: Secondary | ICD-10-CM | POA: Diagnosis not present

## 2018-12-19 ENCOUNTER — Telehealth: Payer: Self-pay

## 2018-12-19 NOTE — Telephone Encounter (Signed)
Patient cancelled with them because symptoms resolved. We can wait until she comes back

## 2018-12-19 NOTE — Telephone Encounter (Signed)
Copied from [place] [id number]. Topic: Referral - Status >> Dec 16, 2018 11:08 AM Yvette Rack wrote: Reason for CRM: Pt stated the appt with Dr. Allen Norris was cancelled and if Dr. Ancil Boozer feels she still needs the appt then another referral would need to be sent to Dr. Dorothey Baseman office. Pt requests a call back. Cb# [phone]

## 2018-12-20 ENCOUNTER — Ambulatory Visit: Payer: Self-pay | Admitting: Pharmacist

## 2018-12-20 DIAGNOSIS — E1129 Type 2 diabetes mellitus with other diabetic kidney complication: Secondary | ICD-10-CM

## 2018-12-20 DIAGNOSIS — N182 Chronic kidney disease, stage 2 (mild): Principal | ICD-10-CM

## 2018-12-20 DIAGNOSIS — E1122 Type 2 diabetes mellitus with diabetic chronic kidney disease: Secondary | ICD-10-CM

## 2018-12-20 DIAGNOSIS — R809 Proteinuria, unspecified: Secondary | ICD-10-CM

## 2018-12-20 NOTE — Chronic Care Management (AMB) (Signed)
   Chronic Care Management   Note  12/20/2018 Name: Amber Avery MRN: [id number] DOB: [birth year]  73 y.o. year old female referred to Chronic Care Management by Dr. Steele Sizer for diabetes management and medication assistance. Chronic conditions include diabetes, OSA, HTN. Last office visit with Steele Sizer, MD was 12/08/18.   Was unable to reach patient via telephone today and have left HIPAA compliant voicemail asking patient to return my call. (unsuccessful outreach #[phone]).   Goals Addressed            This Visit's Progress   . "Victoza is expensive, are there other options for me?" (pt-stated)       Current Barriers:  . Financial Barriers  Pharmacist Clinical Goal(s):  [name] Kitchen Over the next 14 days, patient will work with Dr. Ancil Boozer and Ruben Reason to address needs related to diabetes medication regimen and copay costs  Interventions: . Advised patient to ask Dr. Ancil Boozer about alternatives to Victoza at 12/08/18 office visit  Patient Self Care Activities:  . Calls provider office for new concerns or questions  Plan: . Patient will consult with Dr. Ancil Boozer  Initial goal documentation        Follow up plan: The CM team will reach out to the patient again over the next 7 days.   Ruben Reason, PharmD Clinical Pharmacist [facility] [phone]

## 2018-12-20 NOTE — Patient Instructions (Signed)
Goals Addressed            This Visit's Progress   . "Victoza is expensive, are there other options for me?" (pt-stated)       Current Barriers:  . Financial Barriers  Pharmacist Clinical Goal(s):  [name] Kitchen Over the next 14 days, patient will work with Dr. Ancil Boozer and Ruben Reason to address needs related to diabetes medication regimen and copay costs  Interventions: . Advised patient to ask Dr. Ancil Boozer about alternatives to Victoza at 12/08/18 office visit  Patient Self Care Activities:  . Calls provider office for new concerns or questions  Plan: . Patient will consult with Dr. Ancil Boozer  Initial goal documentation        The patient verbalized understanding of instructions provided today and declined a print copy of patient instruction materials.

## 2018-12-22 ENCOUNTER — Ambulatory Visit: Payer: Self-pay | Admitting: Pharmacist

## 2018-12-22 DIAGNOSIS — I10 Essential (primary) hypertension: Secondary | ICD-10-CM

## 2018-12-22 DIAGNOSIS — N182 Chronic kidney disease, stage 2 (mild): Principal | ICD-10-CM

## 2018-12-22 DIAGNOSIS — E1129 Type 2 diabetes mellitus with other diabetic kidney complication: Secondary | ICD-10-CM

## 2018-12-22 DIAGNOSIS — E1122 Type 2 diabetes mellitus with diabetic chronic kidney disease: Secondary | ICD-10-CM

## 2018-12-22 DIAGNOSIS — R809 Proteinuria, unspecified: Secondary | ICD-10-CM

## 2018-12-22 NOTE — Chronic Care Management (AMB) (Signed)
Made an earlier error- patient is requesting TEST STRIPS not pen needles.   Ruben Reason, PharmD Clinical Pharmacist [facility] [phone]

## 2018-12-22 NOTE — Chronic Care Management (AMB) (Signed)
  Chronic Care Management   Note  12/22/2018 Name: Amber Avery MRN: [id number] DOB: January 11, 1946  #Care Coordination with [name] today. Received her portion of [employer] application and Dr. Steele Sizer signed prescription and precriber portion. Submitted application to [employer] via fax. Uploaded under Media tab.   #Refill request: Received incoming call from [name]. HIPAA identifiers verified.[name] is requesting new pen needle prescription. Advised her that Dr. Ancil Boozer is out of office for the rest of day, but a new prescription will be sent in within 72 hours.   Goals Addressed            This Visit's Progress   . "Victoza is expensive, are there other options for me?" (pt-stated)   On track    Current Barriers:  . Financial Barriers  Pharmacist Clinical Goal(s):  [name] Kitchen Over the next 14 days, patient will work with Dr. Ancil Boozer and Ruben Reason to address needs related to diabetes medication regimen and copay costs  Interventions: . Advised patient to ask Dr. Ancil Boozer about alternatives to Victoza at 12/08/18 office visit  Application submitted 7/0/35, uploaded into media tab   Patient Self Care Activities:  . Calls provider office for new concerns or questions  Plan: . Patient will consult with Dr. Ancil Boozer  Please see past updates related to this goal by clicking on the "Past Updates" button in the selected goal         Follow up plan: The CM team will reach out to the patient again over the next 14 days.  To follow up on Trulicity application   Ruben Reason, PharmD Clinical Pharmacist [facility] [employer] [phone]

## 2018-12-23 ENCOUNTER — Other Ambulatory Visit: Payer: Self-pay

## 2018-12-23 ENCOUNTER — Other Ambulatory Visit: Payer: Self-pay | Admitting: Family Medicine

## 2018-12-23 DIAGNOSIS — R69 Illness, unspecified: Secondary | ICD-10-CM | POA: Diagnosis not present

## 2018-12-23 MED ORDER — GLUCOSE BLOOD VI STRP: ORAL_STRIP | 12 refills | Status: DC

## 2018-12-26 ENCOUNTER — Ambulatory Visit (INDEPENDENT_AMBULATORY_CARE_PROVIDER_SITE_OTHER): Payer: Medicare HMO | Admitting: Pharmacist

## 2018-12-26 DIAGNOSIS — R809 Proteinuria, unspecified: Secondary | ICD-10-CM

## 2018-12-26 DIAGNOSIS — N182 Chronic kidney disease, stage 2 (mild): Secondary | ICD-10-CM

## 2018-12-26 DIAGNOSIS — E1129 Type 2 diabetes mellitus with other diabetic kidney complication: Secondary | ICD-10-CM | POA: Diagnosis not present

## 2018-12-26 DIAGNOSIS — E1122 Type 2 diabetes mellitus with diabetic chronic kidney disease: Secondary | ICD-10-CM

## 2018-12-26 NOTE — Patient Instructions (Signed)
Goals Addressed            This Visit's Progress   . COMPLETED: "Victoza is expensive, are there other options for me?" (pt-stated)       Current Barriers:  . Financial Barriers  Pharmacist Clinical Goal(s):  [name] Kitchen Over the next 14 days, patient will work with Dr. Ancil Boozer and Ruben Reason to address needs related to diabetes medication regimen and copay costs  Interventions: . Advised patient to ask Dr. Ancil Boozer about alternatives to Victoza at 12/08/18 office visit  Application submitted 06/20/94, uploaded into media tab   Application approved 04/21/72, uploaded into media tab  Patient Self Care Activities:  . Calls provider office for new concerns or questions  Plan: . Patient will consult with Dr. Ancil Boozer  Please see past updates related to this goal by clicking on the "Past Updates" button in the selected goal         The patient verbalized understanding of instructions provided today and declined a print copy of patient instruction materials.

## 2018-12-26 NOTE — Chronic Care Management (AMB) (Signed)
  Chronic Care Management   Note  12/26/2018 Name: LYLIANA DICENSO MRN: [id number] DOB: [birth year]  Subjective: Ms. Spalla is a 73 year old female patient of Dr. Ancil Boozer recently initiated on Trulicity (switched from [place]) due to cost. Applied for medication assistance from [employer] via CCM pharmacist on 12/22/18.   Assessment:  Medication Assistance: Patient is has been approved for Trulicity made by [employer] and prescribed by Dr. Steele Sizer. Approved 12/22/18 and expires 10/19/19.   Patient should receive medications via mail to the office in 7 business days. Counseled patient on applicable refill process.     Goals Addressed            This Visit's Progress   . COMPLETED: "Victoza is expensive, are there other options for me?" (pt-stated)       Current Barriers:  . Financial Barriers  Pharmacist Clinical Goal(s):  [name] Kitchen Over the next 14 days, patient will work with Dr. Ancil Boozer and Ruben Reason to address needs related to diabetes medication regimen and copay costs  Interventions: . Advised patient to ask Dr. Ancil Boozer about alternatives to Victoza at 12/08/18 office visit  Application submitted 02/19/85, uploaded into media tab   Application approved 11/26/22, uploaded into media tab  Patient Self Care Activities:  . Calls provider office for new concerns or questions  Plan: . Patient will consult with Dr. Ancil Boozer  Please see past updates related to this goal by clicking on the "Past Updates" button in the selected goal          Follow up plan: The patient has been provided with contact information for the chronic care management team and has been advised to call with any health related questions or concerns.   Ruben Reason, PharmD Clinical Pharmacist [facility] [phone]

## 2019-01-05 ENCOUNTER — Other Ambulatory Visit: Payer: Self-pay

## 2019-01-05 ENCOUNTER — Ambulatory Visit: Payer: Self-pay | Admitting: Pharmacist

## 2019-01-05 DIAGNOSIS — R809 Proteinuria, unspecified: Secondary | ICD-10-CM

## 2019-01-05 DIAGNOSIS — E1129 Type 2 diabetes mellitus with other diabetic kidney complication: Secondary | ICD-10-CM

## 2019-01-05 DIAGNOSIS — N182 Chronic kidney disease, stage 2 (mild): Principal | ICD-10-CM

## 2019-01-05 DIAGNOSIS — E1122 Type 2 diabetes mellitus with diabetic chronic kidney disease: Secondary | ICD-10-CM

## 2019-01-05 NOTE — Chronic Care Management (AMB) (Signed)
  Chronic Care Management   Note  01/05/2019 Name: Amber Avery MRN: [id number] DOB: May 11, 1946  Successful outreach to Amber Avery for clinical pharmacy follow up today. HIPAA identifiers verified.  Amber Avery confirmed that she did pick up her Trulicity shipment. This morning was her second injection. She is tolerating the Trulicity well, no reported side effects at this time.   Goals Addressed                      This Visit's Progress    . COMPLETED: "Victoza is expensive, are there other options for me?" (pt-stated)        Current Barriers:   Financial Barriers  Pharmacist Clinical Goal(s):   Over the next 14 days, patient will work with Dr. Ancil Boozer and Ruben Reason to address needs related to diabetes medication regimen and copay costs  Interventions:  Advised patient to ask Dr. Ancil Boozer about alternatives to Victoza at 12/08/18 office visit ? Application submitted 10/20/22, uploaded into media tab  ? Application approved 05/20/49, uploaded into media tab  Patient Self Care Activities:   Calls provider office for new concerns or questions  Plan:  Patient will consult with Dr. Ancil Boozer  Please see past updates related to this goal by clicking on the "Past Updates" button in the selected goal     Follow up plan: Telephone follow up appointment with CCM team member scheduled for: April 2 with PharmD for pill pack 90 day assessment  Ruben Reason, PharmD Clinical Pharmacist [facility] [phone]

## 2019-01-05 NOTE — Patient Instructions (Signed)
Goals Addressed                      This Visit's Progress    . COMPLETED: "Victoza is expensive, are there other options for me?" (pt-stated)        Current Barriers:   Financial Barriers  Pharmacist Clinical Goal(s):   Over the next 14 days, patient will work with Dr. Ancil Boozer and Ruben Reason to address needs related to diabetes medication regimen and copay costs  Interventions:  Advised patient to ask Dr. Ancil Boozer about alternatives to Victoza at 12/08/18 office visit ? Application submitted 04/22/11, uploaded into media tab  ? Application approved 05/25/85, uploaded into media tab  Patient Self Care Activities:   Calls provider office for new concerns or questions  Plan:  Patient will consult with Dr. Ancil Boozer  Please see past updates related to this goal by clicking on the "Past Updates" button in the selected goal      The patient verbalized understanding of instructions provided today and declined a print copy of patient instruction materials.

## 2019-01-15 DIAGNOSIS — J449 Chronic obstructive pulmonary disease, unspecified: Secondary | ICD-10-CM | POA: Diagnosis not present

## 2019-01-15 DIAGNOSIS — G4733 Obstructive sleep apnea (adult) (pediatric): Secondary | ICD-10-CM | POA: Diagnosis not present

## 2019-01-19 ENCOUNTER — Other Ambulatory Visit: Payer: Self-pay

## 2019-01-19 ENCOUNTER — Ambulatory Visit: Payer: Self-pay | Admitting: Pharmacist

## 2019-01-19 DIAGNOSIS — E1122 Type 2 diabetes mellitus with diabetic chronic kidney disease: Secondary | ICD-10-CM

## 2019-01-19 DIAGNOSIS — N182 Chronic kidney disease, stage 2 (mild): Principal | ICD-10-CM

## 2019-01-19 DIAGNOSIS — I10 Essential (primary) hypertension: Secondary | ICD-10-CM

## 2019-01-19 NOTE — Patient Instructions (Signed)
Congratulations! You have met all case management goals! You may call the case management team at any time should you have a question or if you have new case management needs. We are happy to help you! We will let your doctor know that you have met your goals.    Thank you allowing the Chronic Care Management Team to be a part of your care!   Please call a member of the CCM (Chronic Care Management) Team with any questions or case management needs in the future:   Trish Fountain, RN, BSN Nurse Care Coordinator  [phone]  Ruben Reason, PharmD  Clinical Pharmacist  [phone]   Goals Addressed            This Visit's Progress   . COMPLETED: "I want to keep doing good with my diabetes and blood pressure" (pt-stated)         Clinical Goal(s): Patient will monitor bp weekly and document, bringing her documentation to provider visits.   Interventions: Education previously provided re: long term management of HTN and DM;  Reinforced importance of long term self monitoring   Update 01/18/19: Patient's FBG was 118 mg/dL this morning. Her recent morning FBG ranges have peaked at 146 mg/dL since starting Trulicity.      . COMPLETED: "I want to use pill packs to keep track of my medicine" (pt-stated)        Clinical Goal(s): Over the next 90 days, Ms. Tangonan will be adherent to all medications as evidenced by a percentage of days covered [facility]) report of >80%.   Interventions:  - completed medication review - compared medication benefits under patient's Medicare Advantage plan (with printout) for patient to provide to [facility]

## 2019-01-19 NOTE — Chronic Care Management (AMB) (Signed)
  Chronic Care Management   Follow Up Note   01/19/2019 Name: CHAYE MISCH MRN: [id number] DOB: [birth year]  Referred by: Steele Sizer, MD Reason for referral : Chronic Care Management (Pharmacy follow up)   MYIAH PETKUS is a 73 y.o. year old female who is a primary care patient of Steele Sizer, MD. The CCM team was consulted for assistance with chronic disease management and care coordination needs.    Review of patient status, including review of consultants reports, relevant laboratory and other test results, and collaboration with appropriate care team members and the patient's provider was performed as part of comprehensive patient evaluation and provision of chronic care management services.   Assessment:  #Medication Management: Tolerating Trulicity well, 4th injection was this morning. No noted side effects.   #Medication adherence: reviewed fill history (provided by [employer]). Patient is filling some medications for cash price, but is adherent thanks to pill packaging service provided by [employer].  #Diabetes: Patient reports improved diet and increasing protein. Utilizing Meals on Wheels. FBG this morning was 118 mg/dL.   Goals Addressed            This Visit's Progress   . COMPLETED: "I want to keep doing good with my diabetes and blood pressure" (pt-stated)         Clinical Goal(s): Patient will monitor bp weekly and document, bringing her documentation to provider visits.   Interventions: Education previously provided re: long term management of HTN and DM;  Reinforced importance of long term self monitoring   Update 01/18/19: Patient's FBG was 118 mg/dL this morning. Her recent morning FBG ranges have peaked at 146 mg/dL since starting Trulicity.      . COMPLETED: "I want to use pill packs to keep track of my medicine" (pt-stated)        Clinical Goal(s): Over the next 90 days, Ms. Bolar will be adherent to all medications as  evidenced by a percentage of days covered [facility]) report of >80%.   Interventions:  - completed medication review - compared medication benefits under patient's Medicare Advantage plan (with printout) for patient to provide to Dian Situ at [facility]         The patient has been provided with contact information for the chronic care management team and has been advised to call with any health related questions or concerns.    Ruben Reason, PharmD Clinical Pharmacist [facility] [phone]

## 2019-01-31 ENCOUNTER — Ambulatory Visit: Payer: Self-pay | Admitting: Gastroenterology

## 2019-02-03 ENCOUNTER — Telehealth: Payer: Self-pay | Admitting: *Deleted

## 2019-02-03 NOTE — Telephone Encounter (Signed)
Patient called to cancel her 03/03/19 lab/MD appts. She stated that she will call back at a later date to R/S.

## 2019-02-15 DIAGNOSIS — G4733 Obstructive sleep apnea (adult) (pediatric): Secondary | ICD-10-CM | POA: Diagnosis not present

## 2019-03-02 ENCOUNTER — Other Ambulatory Visit: Payer: Self-pay

## 2019-03-02 ENCOUNTER — Ambulatory Visit: Payer: Self-pay | Admitting: Oncology

## 2019-03-03 ENCOUNTER — Ambulatory Visit: Payer: Self-pay | Admitting: Oncology

## 2019-03-03 ENCOUNTER — Other Ambulatory Visit: Payer: Self-pay

## 2019-03-17 DIAGNOSIS — G4733 Obstructive sleep apnea (adult) (pediatric): Secondary | ICD-10-CM | POA: Diagnosis not present

## 2019-04-13 ENCOUNTER — Other Ambulatory Visit: Payer: Self-pay | Admitting: Family Medicine

## 2019-04-13 NOTE — Telephone Encounter (Signed)
Request for diabetes medication. Trulicity to [place] office visit pertaining to diabetes: 12/08/2018   Lab Results  Component Value Date   HGBA1C 6.3 12/08/2018     Follow up on 04/14/2019

## 2019-04-13 NOTE — Telephone Encounter (Signed)
Pt called and is needing her Trulicity injections refilled. Pt states the medication is ordered and sent in to the office. Please advise.

## 2019-04-14 ENCOUNTER — Ambulatory Visit (INDEPENDENT_AMBULATORY_CARE_PROVIDER_SITE_OTHER): Payer: Medicare HMO | Admitting: Family Medicine

## 2019-04-14 ENCOUNTER — Encounter: Payer: Self-pay | Admitting: Family Medicine

## 2019-04-14 ENCOUNTER — Other Ambulatory Visit: Payer: Self-pay

## 2019-04-14 VITALS — BP 124/68 | HR 79 | Temp 97.5°F | Resp 16 | Ht 62.0 in | Wt 265.5 lb

## 2019-04-14 DIAGNOSIS — G4733 Obstructive sleep apnea (adult) (pediatric): Secondary | ICD-10-CM

## 2019-04-14 DIAGNOSIS — M109 Gout, unspecified: Secondary | ICD-10-CM | POA: Diagnosis not present

## 2019-04-14 DIAGNOSIS — M1990 Unspecified osteoarthritis, unspecified site: Secondary | ICD-10-CM

## 2019-04-14 DIAGNOSIS — I10 Essential (primary) hypertension: Secondary | ICD-10-CM

## 2019-04-14 DIAGNOSIS — N183 Chronic kidney disease, stage 3 unspecified: Secondary | ICD-10-CM

## 2019-04-14 DIAGNOSIS — R600 Localized edema: Secondary | ICD-10-CM | POA: Diagnosis not present

## 2019-04-14 DIAGNOSIS — N182 Chronic kidney disease, stage 2 (mild): Secondary | ICD-10-CM | POA: Diagnosis not present

## 2019-04-14 DIAGNOSIS — E1122 Type 2 diabetes mellitus with diabetic chronic kidney disease: Secondary | ICD-10-CM

## 2019-04-14 DIAGNOSIS — E041 Nontoxic single thyroid nodule: Secondary | ICD-10-CM

## 2019-04-14 DIAGNOSIS — E785 Hyperlipidemia, unspecified: Secondary | ICD-10-CM | POA: Diagnosis not present

## 2019-04-14 LAB — POCT GLYCOSYLATED HEMOGLOBIN (HGB A1C): HbA1c, POC (controlled diabetic range): 6 % (ref 0.0–7.0)

## 2019-04-14 MED ORDER — ATORVASTATIN CALCIUM 40 MG PO TABS: ORAL_TABLET | ORAL | 3 refills | Status: DC

## 2019-04-14 MED ORDER — TRULICITY 1.5 MG/0.5ML ~~LOC~~ SOAJ: 1.5000 mg | SUBCUTANEOUS | 3 refills | Status: DC

## 2019-04-14 MED ORDER — POTASSIUM CHLORIDE CRYS ER 20 MEQ PO TBCR: 20.0000 meq | EXTENDED_RELEASE_TABLET | Freq: Every day | ORAL | 3 refills | Status: DC

## 2019-04-14 MED ORDER — BENAZEPRIL HCL 20 MG PO TABS: 20.0000 mg | ORAL_TABLET | Freq: Every day | ORAL | 3 refills | Status: DC

## 2019-04-14 MED ORDER — FUROSEMIDE 40 MG PO TABS: 40.0000 mg | ORAL_TABLET | Freq: Every day | ORAL | 3 refills | Status: DC

## 2019-04-14 MED ORDER — ALLOPURINOL 300 MG PO TABS: 300.0000 mg | ORAL_TABLET | Freq: Every day | ORAL | 3 refills | Status: DC

## 2019-04-14 NOTE — Addendum Note (Signed)
Addended by: Steele Sizer F on: 04/14/2019 11:50 AM   Modules accepted: Orders

## 2019-04-14 NOTE — Progress Notes (Signed)
Name: Amber Avery   MRN: [id number]    DOB: Jul 22, 1946   Date:04/14/2019       Progress Note  Subjective  Chief Complaint  Chief Complaint  Patient presents with  . Medication Refill  . Diabetes  . Hypertension  . Asthma  . Dyslipidemia  . Sleep Apnea  . gait instability    HPI  Dyslipidemia: taking statin therapy, no side effects, no chest pain or myalgia  AsthmaMild intermittent: she states she is doing much better since she started showering daily, she has a cough daily, but only during her shower, no wheezing or SOB.She uses albuterol very seldom. Does not need a refill.   DM with renal manifestation: HgbA1C was down to 6.9%, 7.2,,7.0%%,6.3%,6.1%,6.5%, 6.6% , 6.3% and today is down to 6% She is on metformin, and Victoza since 11/2016, but we switched to trulicity because of cost, she is tolerating well, we will stop Metformin today since A1C is below 6.5%, no polyphagia, polyuria or polydipsia . CKI stage III   Obesity: shelost 8  lbs since last visit, she is still walking up to almost 5 thousand steps daily   Change in bowel movements: she was seen by GI, had upper gi and is waiting to have repeat colonoscopy   OSA: she wear machine every night, she has a new mask and uses oxygen with CPAP.She is doing well on new mask. She states oxygen price has gone down again   Right thyroid nodule; seen by Endo at [facility], biopsy done and negative.She states last visit there was not need for biopsy   Gait instability: she uses a cane,they moved mailbox near her house and is doing well. Still has pain on both knees from OA.She has a walk in shower and a ramp now and it has helped her to be more independent, she states the instability is because her knee gives out at times  Still using a cane  OA : shesaw Ortho, Dr. Claretha Cooper he advised her not to have surgery because of her weight, she had one round of hyaluronan injection without help and it was too  expensive. She is still taking Tylenol  She has effusion when more active, no redness or increase in warmth. She has a hoveround, uses walker and sometimes cane. Unchanged   Iron deficiency anemia: last level was better, under the care of Dr. Tasia Catchings   Patient Active Problem List   Diagnosis Date Noted  . Angiodysplasia of stomach and duodenum   . Acute gastritis with hemorrhage   . Iron deficiency anemia 04/20/2018  . Anemia, unspecified 04/12/2017  . Lichen sclerosus of female genitalia 06/29/2016  . Primary osteoarthritis of both knees 04/07/2016  . Special screening for malignant neoplasms, colon   . Right thyroid nodule 06/26/2015  . Asthma, intermittent 04/03/2015  . Benign essential HTN 04/03/2015  . Carpal tunnel syndrome 04/03/2015  . Chronic kidney disease (CKD), stage II (mild) 04/03/2015  . Controlled gout 04/03/2015  . Arteriosclerosis of coronary artery 04/03/2015  . Diabetes mellitus with renal manifestations, controlled ([place]) 04/03/2015  . Dyslipidemia 04/03/2015  . Edema extremities 04/03/2015  . Elevated sedimentation rate 04/03/2015  . Knee pain 04/03/2015  . Lumbar radiculitis 04/03/2015  . Obstructive apnea 04/03/2015  . Lumbosacral spondylosis 04/03/2015  . Osteoarthritis, chronic 04/03/2015  . Psoriasis 04/03/2015  . Vitamin D deficiency 04/03/2015  . Varicose veins 04/03/2015  . Goiter 12/23/2014  . Degeneration of intervertebral disc of lumbar region 08/23/2014  . Corns and callosity 06/11/2009  .  Extreme obesity 04/11/2007    Past Surgical History:  Procedure Laterality Date  . APPENDECTOMY    . CHOLECYSTECTOMY  1977  . COLONOSCOPY WITH PROPOFOL N/A 01/21/2016   Procedure: COLONOSCOPY WITH PROPOFOL;  Surgeon: Lucilla Lame, MD;  Location: [facility] ENDOSCOPY;  Service: Endoscopy;  Laterality: N/A;  . DILATION AND CURETTAGE OF UTERUS     Due to Amenorrhea  . ESOPHAGOGASTRODUODENOSCOPY (EGD) WITH PROPOFOL N/A 08/16/2018   Procedure:  ESOPHAGOGASTRODUODENOSCOPY (EGD) WITH PROPOFOL;  Surgeon: Lucilla Lame, MD;  Location: [facility] ENDOSCOPY;  Service: Endoscopy;  Laterality: N/A;  . [place]  . HEMORROIDECTOMY    . HERNIA REPAIR  2011  . Temporal Area Excision Biopsy     For Birth Mark Changes, Negative Pathology  . TONSILLECTOMY AND ADENOIDECTOMY      Family History  Problem Relation Age of Onset  . Cancer Mother        brain tumor  . Kidney disease Mother   . Hypertension Mother   . Heart disease Father   . COPD Father   . Hypertension Sister   . Arthritis Sister   . Cancer Sister   . GER disease Sister   . Lupus Sister   . Diabetes Sister   . Arthritis Sister   . Osteopenia Sister   . Heart disease Sister   . Hypertension Sister   . Breast cancer Maternal Aunt 70  . Leukemia Maternal Aunt     Social History   Socioeconomic History  . Marital status: Divorced    Spouse name: Not on file  . Number of children: 0  . Years of education: Not on file  . Highest education level: 12th grade  Occupational History  . Occupation: Retired  Scientific laboratory technician  . Financial resource strain: Not hard at all  . Food insecurity    Worry: Never true    Inability: Never true  . Transportation needs    Medical: No    Non-medical: No  Tobacco Use  . Smoking status: Former Smoker    Packs/day: 2.00    Years: 15.00    Pack years: 30.00    Types: Cigarettes    Quit date: 1983    Years since quitting: 37.5  . Smokeless tobacco: Never Used  . Tobacco comment: smoking cessation materials not required  Substance and Sexual Activity  . Alcohol use: Not Currently    Alcohol/week: 0.0 standard drinks  . Drug use: No  . Sexual activity: Not Currently  Lifestyle  . Physical activity    Days per week: 0 days    Minutes per session: 0 min  . Stress: Not at all  Relationships  . Social Herbalist on phone: Patient refused    Gets together: Patient refused    Attends religious service: Patient  refused    Active member of club or organization: Patient refused    Attends meetings of clubs or organizations: Patient refused    Relationship status: Divorced  . Intimate partner violence    Fear of current or ex partner: No    Emotionally abused: No    Physically abused: No    Forced sexual activity: No  Other Topics Concern  . Not on file  Social History Narrative  . Not on file     Current Outpatient Medications:  .  acetaminophen (TYLENOL) 500 MG tablet, Take 1 tablet (500 mg total) by mouth every 6 (six) hours as needed. (Patient taking differently: Take 1,000 mg by  mouth every 8 (eight) hours. ), Disp: 480 tablet, Rfl: 0 .  albuterol (ACCUNEB) 1.25 MG/3ML nebulizer solution, Take 3 mLs (1.25 mg total) by nebulization 2 (two) times daily., Disp: 75 mL, Rfl: 2 .  allopurinol (ZYLOPRIM) 300 MG tablet, Take 1 tablet (300 mg total) by mouth daily., Disp: 90 tablet, Rfl: 3 .  atorvastatin (LIPITOR) 40 MG tablet, Daily for cholesterol, Disp: 90 tablet, Rfl: 3 .  benazepril (LOTENSIN) 20 MG tablet, Take 1 tablet (20 mg total) by mouth daily., Disp: 90 tablet, Rfl: 3 .  budesonide (PULMICORT) 0.25 MG/2ML nebulizer solution, Take 2 mLs (0.25 mg total) by nebulization 2 (two) times daily., Disp: 60 mL, Rfl: 2 .  colchicine 0.6 MG tablet, Take 1 tablet by mouth 2 (two) times daily. Takes as needed for flares, Disp: , Rfl:  .  diclofenac sodium (VOLTAREN) 1 % GEL, APPLY 4 GRAMS TOPICALLY TO BOTH KNEES 4 TIMES DAILY FOR OSTEOARTHRITIS, Disp: 100 g, Rfl: 2 .  Dulaglutide (TRULICITY) 1.5 SF/6.8LE SOPN, Inject 1.5 mg into the skin once a week., Disp: 12 pen, Rfl: 3 .  furosemide (LASIX) 40 MG tablet, TAKE 1 TABLET 2 TIMES DAILYAS NEEDED (Patient taking differently: TAKE 1 TABLET 2 TIMES DAILY), Disp: 90 tablet, Rfl: 3 .  gabapentin (NEURONTIN) 300 MG capsule, 1 in am, 1 in pm and 2 at night, Disp: 360 capsule, Rfl: 2 .  glucose blood test strip, Use as instructed (Patient taking differently: 1  each. Use as instructed One Touch Delica Lancets), Disp: 100 each, Rfl: 12 .  Insulin Pen Needle 32G X 6 MM MISC, 1 each by Does not apply route 4 (four) times daily., Disp: 200 each, Rfl: 2 .  Lancets (ONETOUCH ULTRASOFT) lancets, Use as instructed, Disp: 100 each, Rfl: 12 .  metFORMIN (GLUCOPHAGE) 500 MG tablet, TAKE 1 TAB IN MORNING AND 2 TABS IN EVENING, Disp: 270 tablet, Rfl: 3 .  potassium chloride SA (K-DUR,KLOR-CON) 20 MEQ tablet, Take 1-2 tablets (20-40 mEq total) by mouth daily., Disp: 180 tablet, Rfl: 3 .  triamcinolone ointment (KENALOG) 0.1 %, APPLY A PEA SIZE AMOUNT EXTERNALLY TO AREA EVERY OTHER DAY, Disp: 90 g, Rfl: 1 .  Vitamin D, Cholecalciferol, 1000 UNITS TABS, Take 1 capsule by mouth daily., Disp: , Rfl:  .  Blood Glucose Monitoring Suppl (TRUE METRIX AIR GLUCOSE METER) w/Device KIT, 1 each by Does not apply route 1 day or 1 dose. (Patient not taking: Reported on 04/14/2019), Disp: 1 kit, Rfl: 0  Allergies  Allergen Reactions  . Codeine     I personally reviewed active problem list, medication list, allergies, family history with the patient/caregiver today.   ROS  Constitutional: Negative for fever, positive for weight change.  Respiratory: Negative for cough and shortness of breath.   Cardiovascular: Negative for chest pain or palpitations.  Gastrointestinal: Negative for abdominal pain, no bowel changes.  Musculoskeletal: positive  for gait problem and intermittent  joint swelling.  Skin: Negative for rash.  Neurological: Negative for dizziness or headache.  No other specific complaints in a complete review of systems (except as listed in HPI above).  Objective  Vitals:   04/14/19 1023  BP: 124/68  Pulse: 79  Resp: 16  Temp: (!) 97.5 F (36.4 C)  TempSrc: Oral  SpO2: 98%  Weight: 265 lb 8 oz (120.4 kg)  Height: 5' 2"  (1.575 m)    Body mass index is 48.56 kg/m.  Physical Exam  Constitutional: Patient appears well-developed and well-nourished.  Obese  No distress.  HEENT: head atraumatic, normocephalic, pupils equal and reactive to light,  neck supple Cardiovascular: Normal rate, regular rhythm and normal heart sounds.  No murmur heard. No BLE edema. Pulmonary/Chest: Effort normal and breath sounds normal. No respiratory distress. Abdominal: Soft.  There is no tenderness. Muscular Skeletal: crepitus with extension of both knees  Psychiatric: Patient has a normal mood and affect. behavior is normal. Judgment and thought content normal.  Recent Results (from the past 2160 hour(s))  POCT HgB A1C     Status: Normal   Collection Time: 04/14/19 10:33 AM  Result Value Ref Range   Hemoglobin A1C     HbA1c POC (<> result, manual entry)     HbA1c, POC (prediabetic range)     HbA1c, POC (controlled diabetic range) 6.0 0.0 - 7.0 %    Diabetic Foot Exam: Diabetic Foot Exam - Simple   Simple Foot Form Diabetic Foot exam was performed with the following findings: Yes 04/14/2019 11:32 AM  Visual Inspection See comments: Yes Sensation Testing Intact to touch and monofilament testing bilaterally: Yes Pulse Check Posterior Tibialis and Dorsalis pulse intact bilaterally: Yes Comments Thick toenails       PHQ2/9: Depression screen [facility] 2/9 04/14/2019 08/08/2018 06/17/2018 06/15/2018 04/11/2018  Decreased Interest 0 0 0 0 0  Down, Depressed, Hopeless 0 0 0 0 0  PHQ - 2 Score 0 0 0 0 0  Altered sleeping 0 1 0 - 1  Tired, decreased energy 2 2 0 - 1  Change in appetite 0 0 0 - 0  Feeling bad or failure about yourself  0 0 0 - 0  Trouble concentrating 1 0 0 - 1  Moving slowly or fidgety/restless 0 0 0 - 0  Suicidal thoughts 0 0 0 - 0  PHQ-9 Score 3 3 0 - 3  Difficult doing work/chores Somewhat difficult - Not difficult at all - Not difficult at all    phq 9 is negative   Fall Risk: Fall Risk  04/14/2019 12/08/2018 09/09/2018 08/08/2018 06/17/2018  Falls in the past year? 0 0 0 No No  Number falls in past yr: 0 0 - - -  Injury with  Fall? 0 0 - - -  Risk for fall due to : - - - - Impaired vision;Impaired balance/gait  Risk for fall due to: Comment - - - - wears eyeglasses; ambuates with cane or rolling walker     Functional Status Survey: Is the patient deaf or have difficulty hearing?: No Does the patient have difficulty seeing, even when wearing glasses/contacts?: Yes Does the patient have difficulty concentrating, remembering, or making decisions?: No Does the patient have difficulty walking or climbing stairs?: Yes Does the patient have difficulty dressing or bathing?: No Does the patient have difficulty doing errands alone such as visiting a doctor's office or shopping?: No   Assessment & Plan   1. Controlled type 2 diabetes mellitus with stage 3 chronic kidney disease, without long-term current use of insulin (HCC)  - POCT HgB A1C - Urine Microalbumin w/creat. ratio  2. Benign essential HTN  - benazepril (LOTENSIN) 20 MG tablet; Take 1 tablet (20 mg total) by mouth daily.  Dispense: 90 tablet; Refill: 3 - potassium chloride SA (K-DUR) 20 MEQ tablet; Take 1-2 tablets (20-40 mEq total) by mouth daily.  Dispense: 180 tablet; Refill: 3  3. Chronic kidney disease, stage III (moderate) (HCC)  - benazepril (LOTENSIN) 20 MG tablet; Take 1 tablet (20 mg total) by mouth daily.  Dispense: 90 tablet; Refill: 3  4. Dyslipidemia  - atorvastatin (LIPITOR) 40 MG tablet; Daily for cholesterol  Dispense: 90 tablet; Refill: 3  5. Right thyroid nodule   6. Obstructive apnea   7. Osteoarthritis, chronic   8. Controlled gout  - allopurinol (ZYLOPRIM) 300 MG tablet; Take 1 tablet (300 mg total) by mouth daily.  Dispense: 90 tablet; Refill: 3  9. Bilateral edema of lower extremity  - furosemide (LASIX) 40 MG tablet; Take 1 tablet (40 mg total) by mouth daily. TAKE 1 TABLET 2 TIMES DAILYAS NEEDED  Dispense: 90 tablet; Refill: 3

## 2019-04-15 LAB — MICROALBUMIN / CREATININE URINE RATIO
Creatinine, Urine: 65 mg/dL (ref 20–275)
Microalb Creat Ratio: 18 ug/mg{creat}
Microalb, Ur: 1.2 mg/dL

## 2019-04-17 DIAGNOSIS — H524 Presbyopia: Secondary | ICD-10-CM | POA: Diagnosis not present

## 2019-04-17 DIAGNOSIS — E089 Diabetes mellitus due to underlying condition without complications: Secondary | ICD-10-CM | POA: Diagnosis not present

## 2019-04-17 DIAGNOSIS — G4733 Obstructive sleep apnea (adult) (pediatric): Secondary | ICD-10-CM | POA: Diagnosis not present

## 2019-04-17 DIAGNOSIS — H04123 Dry eye syndrome of bilateral lacrimal glands: Secondary | ICD-10-CM | POA: Diagnosis not present

## 2019-04-17 DIAGNOSIS — E119 Type 2 diabetes mellitus without complications: Secondary | ICD-10-CM | POA: Diagnosis not present

## 2019-04-17 DIAGNOSIS — H2513 Age-related nuclear cataract, bilateral: Secondary | ICD-10-CM | POA: Diagnosis not present

## 2019-04-17 LAB — HM DIABETES EYE EXAM

## 2019-04-24 DIAGNOSIS — R69 Illness, unspecified: Secondary | ICD-10-CM | POA: Diagnosis not present

## 2019-04-26 ENCOUNTER — Other Ambulatory Visit: Payer: Self-pay | Admitting: Family Medicine

## 2019-04-26 MED ORDER — TRULICITY 1.5 MG/0.5ML ~~LOC~~ SOAJ: 1.5000 mg | SUBCUTANEOUS | 3 refills | Status: DC

## 2019-05-03 ENCOUNTER — Telehealth: Payer: Self-pay | Admitting: Family Medicine

## 2019-05-03 ENCOUNTER — Ambulatory Visit (INDEPENDENT_AMBULATORY_CARE_PROVIDER_SITE_OTHER): Payer: Medicare HMO | Admitting: Pharmacist

## 2019-05-03 DIAGNOSIS — I10 Essential (primary) hypertension: Secondary | ICD-10-CM

## 2019-05-03 DIAGNOSIS — E1122 Type 2 diabetes mellitus with diabetic chronic kidney disease: Secondary | ICD-10-CM | POA: Diagnosis not present

## 2019-05-03 DIAGNOSIS — N182 Chronic kidney disease, stage 2 (mild): Secondary | ICD-10-CM

## 2019-05-03 DIAGNOSIS — E785 Hyperlipidemia, unspecified: Secondary | ICD-10-CM

## 2019-05-03 NOTE — Telephone Encounter (Signed)
Called to let the patient know that we are not getting any to little samples in and she said that she would not be able to get it then and said that she could just go back on metformin. Please call the pt and talk with them

## 2019-05-03 NOTE — Telephone Encounter (Signed)
Pt stated she normally picks up Dulaglutide (TRULICITY) 1.5 PT/4.7MR SOPN in the office but a rx was sent to her pharmacy instead on 04/26/19. Pt stated there is no cost when she picks it up in office. Please advise. CB#[phone]

## 2019-05-03 NOTE — Telephone Encounter (Signed)
Called Mrs. Reish and gave her information about her [employer] and it should be shipped to her house. Informed patient to called [employer] at [phone] to get information about shipment and ask if we need to reroute her medication to a mail order? Patient is supposed to call back once she finds the [employer] and call their office.

## 2019-05-03 NOTE — Chronic Care Management (AMB) (Signed)
  Chronic Care Management   Follow Up Note   01/19/2019 Name: Amber Avery MRN: [id number] DOB: Feb 23, 1946  Subjective Amber Avery is a 73 y.o. year old female who is a primary care patient of Steele Sizer, MD. The CCM team was consulted for assistance with chronic disease management and care coordination needs.    Review of patient status, including review of consultants reports, relevant laboratory and other test results, and collaboration with appropriate care team members and the patient's provider was performed as part of comprehensive patient evaluation and provision of chronic care management services.   Assessment:  Incoming patient call routed to [facility] clinic pharmacist regarding patient's Trulicity refills. Refill to assistance program was faxed into [employer] [facility] on 03/29/2019 (uploaded under Media tab). [employer] is using a Design[employer], multimedia) that does not accept e-scribe via EMR.   --> refill that was escribed to local pharmacy was a mistake via software system  --> refills have already been submitted to [employer] and therefore [facility] mail order pharmacy   --> patient has to schedule delivery with [facility], a 855 number or a [employer] number  Plan:  Patient provided with phone number to [employer] to resolve scheduling next refill delivery  Goals Addressed            This Visit's Progress   . COMPLETED: "Victoza is expensive, are there other options for me?" (pt-stated)       Current Barriers:  . Financial Barriers  Pharmacist Clinical Goal(s):  [name] Kitchen Over the next 14 days, patient will work with Dr. Ancil Boozer and Ruben Reason to address needs related to diabetes medication regimen and copay costs  Interventions: . Advised patient to ask Dr. Ancil Boozer about alternatives to Victoza at 12/08/18 office visit  Trulicity Application submitted 0/3/75, uploaded into media tab   Trulicity Application approved 01/20/59, uploaded into media  tab  Issue with refills and [facility]; provided patient with telephone number to schedule her delivery of Trulicity with [facility] (mail order pharmacy for [employer] assistance program)  Patient Self Care Activities:  . Calls provider office for new concerns or questions  Plan: . Patient will consult with Dr. Ancil Boozer  Please see past updates related to this goal by clicking on the "Past Updates" button in the selected goal          The patient has been provided with contact information for the chronic care management team and has been advised to call with any health related questions or concerns.    Ruben Reason, PharmD Clinical Pharmacist [facility] [phone]

## 2019-05-03 NOTE — Telephone Encounter (Signed)
Correct, she is enrolled in the [employer] and is not referring to samples.   The program used to deliver to the office but since March/COVID19, has transitioned to a mail order pharmacy that delivers directly to patients. It seems this patient's recent refill (based on document uploaded to Media tab June 18) is her first time with the new home delivery.   The delivery is scheduled with patient via EITHER a [facility] number or an 855 number. She has to answer this call. She needs to call [place], [phone] to get this sorted out. Her refills are already in the system based on our documentation.   Ruben Reason, PharmD Clinical Pharmacist [facility] [phone]

## 2019-05-03 NOTE — Telephone Encounter (Signed)
No. The mail order pharmacy that [place] uses, [facility], already has the refills sent in on 03/29/2019 based on the refill request form that is uploaded under the media tab. There is not a way to e-scribe via the EMR.   The only thing that needs to be done is Ms. Szeto needs to call [employer] to schedule delivery with [facility].   Ruben Reason, PharmD Clinical Pharmacist [facility] [phone]

## 2019-05-15 ENCOUNTER — Other Ambulatory Visit: Payer: Self-pay | Admitting: Family Medicine

## 2019-05-17 DIAGNOSIS — J449 Chronic obstructive pulmonary disease, unspecified: Secondary | ICD-10-CM | POA: Diagnosis not present

## 2019-06-17 DIAGNOSIS — J449 Chronic obstructive pulmonary disease, unspecified: Secondary | ICD-10-CM | POA: Diagnosis not present

## 2019-06-20 ENCOUNTER — Ambulatory Visit (INDEPENDENT_AMBULATORY_CARE_PROVIDER_SITE_OTHER): Payer: Medicare HMO

## 2019-06-20 VITALS — BP 120/62 | HR 66 | Ht 62.0 in | Wt 268.8 lb

## 2019-06-20 DIAGNOSIS — Z0000 Encounter for general adult medical examination without abnormal findings: Secondary | ICD-10-CM | POA: Diagnosis not present

## 2019-06-20 DIAGNOSIS — Z780 Asymptomatic menopausal state: Secondary | ICD-10-CM | POA: Diagnosis not present

## 2019-06-20 DIAGNOSIS — Z1231 Encounter for screening mammogram for malignant neoplasm of breast: Secondary | ICD-10-CM | POA: Diagnosis not present

## 2019-06-20 NOTE — Patient Instructions (Signed)
Amber Avery , Thank you for taking time to come for your Medicare Wellness Visit. I appreciate your ongoing commitment to your health goals. Please review the following plan we discussed and let me know if I can assist you in the future.   Screening recommendations/referrals: Colonoscopy: done 01/21/16. Repeat in 2027. Mammogram: done 08/08/18. Please call [phone] to schedule your mammogram and bone density screening.  Bone Density: done 08/05/17. Recommended yearly ophthalmology/optometry visit for glaucoma screening and checkup Recommended yearly dental visit for hygiene and checkup  Vaccinations: Influenza vaccine: done 06/17/18 Pneumococcal vaccine: done 04/09/17 Tdap vaccine: done 08/30/09 Shingles vaccine: Shingrix discussed. Please contact your pharmacy for coverage information.     Conditions/risks identified: recommend maintaining blood sugar levels with healthy eating and physical activity  Next appointment: Please follow up in one year for your Medicare Annual Wellness visit.     Preventive Care 7 Years and Older, Female Preventive care refers to lifestyle choices and visits with your health care provider that can promote health and wellness. What does preventive care include?  A yearly physical exam. This is also called an annual well check.  Dental exams once or twice a year.  Routine eye exams. Ask your health care provider how often you should have your eyes checked.  Personal lifestyle choices, including:  Daily care of your teeth and gums.  Regular physical activity.  Eating a healthy diet.  Avoiding tobacco and drug use.  Limiting alcohol use.  Practicing safe sex.  Taking low-dose aspirin every day.  Taking vitamin and mineral supplements as recommended by your health care provider. What happens during an annual well check? The services and screenings done by your health care provider during your annual well check will depend on your age,  overall health, lifestyle risk factors, and family history of disease. Counseling  Your health care provider may ask you questions about your:  Alcohol use.  Tobacco use.  Drug use.  Emotional well-being.  Home and relationship well-being.  Sexual activity.  Eating habits.  History of falls.  Memory and ability to understand (cognition).  Work and work Statistician.  Reproductive health. Screening  You may have the following tests or measurements:  Height, weight, and BMI.  Blood pressure.  Lipid and cholesterol levels. These may be checked every 5 years, or more frequently if you are over 11 years old.  Skin check.  Lung cancer screening. You may have this screening every year starting at age 9 if you have a 30-pack-year history of smoking and currently smoke or have quit within the past 15 years.  Fecal occult blood test (FOBT) of the stool. You may have this test every year starting at age 34.  Flexible sigmoidoscopy or colonoscopy. You may have a sigmoidoscopy every 5 years or a colonoscopy every 10 years starting at age 77.  Hepatitis C blood test.  Hepatitis B blood test.  Sexually transmitted disease (STD) testing.  Diabetes screening. This is done by checking your blood sugar (glucose) after you have not eaten for a while (fasting). You may have this done every 1-3 years.  Bone density scan. This is done to screen for osteoporosis. You may have this done starting at age 27.  Mammogram. This may be done every 1-2 years. Talk to your health care provider about how often you should have regular mammograms. Talk with your health care provider about your test results, treatment options, and if necessary, the need for more tests. Vaccines  Your health care provider  may recommend certain vaccines, such as:  Influenza vaccine. This is recommended every year.  Tetanus, diphtheria, and acellular pertussis (Tdap, Td) vaccine. You may need a Td booster every 10  years.  Zoster vaccine. You may need this after age 69.  Pneumococcal 13-valent conjugate (PCV13) vaccine. One dose is recommended after age 39.  Pneumococcal polysaccharide (PPSV23) vaccine. One dose is recommended after age 6. Talk to your health care provider about which screenings and vaccines you need and how often you need them. This information is not intended to replace advice given to you by your health care provider. Make sure you discuss any questions you have with your health care provider. Document Released: 11/01/2015 Document Revised: 06/24/2016 Document Reviewed: 08/06/2015 Elsevier Interactive Patient Education  2017 [employer] Prevention in the Home Falls can cause injuries. They can happen to people of all ages. There are many things you can do to make your home safe and to help prevent falls. What can I do on the outside of my home?  Regularly fix the edges of walkways and driveways and fix any cracks.  Remove anything that might make you trip as you walk through a door, such as a raised step or threshold.  Trim any bushes or trees on the path to your home.  Use bright outdoor lighting.  Clear any walking paths of anything that might make someone trip, such as rocks or tools.  Regularly check to see if handrails are loose or broken. Make sure that both sides of any steps have handrails.  Any raised decks and porches should have guardrails on the edges.  Have any leaves, snow, or ice cleared regularly.  Use sand or salt on walking paths during winter.  Clean up any spills in your garage right away. This includes oil or grease spills. What can I do in the bathroom?  Use night lights.  Install grab bars by the toilet and in the tub and shower. Do not use towel bars as grab bars.  Use non-skid mats or decals in the tub or shower.  If you need to sit down in the shower, use a plastic, non-slip stool.  Keep the floor dry. Clean up any water that  spills on the floor as soon as it happens.  Remove soap buildup in the tub or shower regularly.  Attach bath mats securely with double-sided non-slip rug tape.  Do not have throw rugs and other things on the floor that can make you trip. What can I do in the bedroom?  Use night lights.  Make sure that you have a light by your bed that is easy to reach.  Do not use any sheets or blankets that are too big for your bed. They should not hang down onto the floor.  Have a firm chair that has side arms. You can use this for support while you get dressed.  Do not have throw rugs and other things on the floor that can make you trip. What can I do in the kitchen?  Clean up any spills right away.  Avoid walking on wet floors.  Keep items that you use a lot in easy-to-reach places.  If you need to reach something above you, use a strong step stool that has a grab bar.  Keep electrical cords out of the way.  Do not use floor polish or wax that makes floors slippery. If you must use wax, use non-skid floor wax.  Do not have throw rugs  and other things on the floor that can make you trip. What can I do with my stairs?  Do not leave any items on the stairs.  Make sure that there are handrails on both sides of the stairs and use them. Fix handrails that are broken or loose. Make sure that handrails are as long as the stairways.  Check any carpeting to make sure that it is firmly attached to the stairs. Fix any carpet that is loose or worn.  Avoid having throw rugs at the top or bottom of the stairs. If you do have throw rugs, attach them to the floor with carpet tape.  Make sure that you have a light switch at the top of the stairs and the bottom of the stairs. If you do not have them, ask someone to add them for you. What else can I do to help prevent falls?  Wear shoes that:  Do not have high heels.  Have rubber bottoms.  Are comfortable and fit you well.  Are closed at the  toe. Do not wear sandals.  If you use a stepladder:  Make sure that it is fully opened. Do not climb a closed stepladder.  Make sure that both sides of the stepladder are locked into place.  Ask someone to hold it for you, if possible.  Clearly mark and make sure that you can see:  Any grab bars or handrails.  First and last steps.  Where the edge of each step is.  Use tools that help you move around (mobility aids) if they are needed. These include:  Canes.  Walkers.  Scooters.  Crutches.  Turn on the lights when you go into a dark area. Replace any light bulbs as soon as they burn out.  Set up your furniture so you have a clear path. Avoid moving your furniture around.  If any of your floors are uneven, fix them.  If there are any pets around you, be aware of where they are.  Review your medicines with your doctor. Some medicines can make you feel dizzy. This can increase your chance of falling. Ask your doctor what other things that you can do to help prevent falls. This information is not intended to replace advice given to you by your health care provider. Make sure you discuss any questions you have with your health care provider. Document Released: 08/01/2009 Document Revised: 03/12/2016 Document Reviewed: 11/09/2014 Elsevier Interactive Patient Education  2017 [employer].

## 2019-06-20 NOTE — Progress Notes (Signed)
Subjective:   Amber Avery is a 73 y.o. female who presents for Medicare Annual (Subsequent) preventive examination.  Virtual Visit via Telephone Note  I connected with Terrion Poblano Dallaire on 06/20/19 at  3:30 PM EDT by telephone and verified that I am speaking with the correct person using two identifiers.  Medicare Annual Wellness visit completed telephonically due to Covid-19 pandemic.   Location: Patient: home Provider: office   I discussed the limitations, risks, security and privacy concerns of performing an evaluation and management service by telephone and the availability of in person appointments. The patient expressed understanding and agreed to proceed.  Some vital signs may be absent or patient reported.   Clemetine Marker, LPN     Review of Systems:   Cardiac Risk Factors include: advanced age (>37mn, >>42women);diabetes mellitus;dyslipidemia;hypertension;obesity (BMI >30kg/m2)     Objective:     Vitals: BP 120/62   Pulse 66   Ht 5' 2"  (1.575 m)   Wt 268 lb 12.8 oz (121.9 kg)   BMI 49.16 kg/m   Body mass index is 49.16 kg/m.  Advanced Directives 06/20/2019 09/01/2018 08/16/2018 06/17/2018 06/01/2018 04/20/2018 08/09/2017  Does Patient Have a Medical Advance Directive? Yes Yes Yes Yes Yes Yes Yes  Type of AParamedicof [place] will - HMercedLiving will [place] will - - [place] will  Does patient want to make changes to medical advance directive? - - - - - - -  Copy of [place] Chart? Yes - validated most recent copy scanned in chart (See row information) - Yes Yes - - Yes  Would patient like information on creating a medical advance directive? - - - Yes (MAU/Ambulatory/Procedural Areas - Information given) - - -    Tobacco Social History   Tobacco Use  Smoking Status Former Smoker  . Packs/day: 2.00  . Years: 15.00  . Pack  years: 30.00  . Types: Cigarettes  . Quit date: 117 . Years since quitting: 37.6  Smokeless Tobacco Never Used  Tobacco Comment   smoking cessation materials not required     Counseling given: Not Answered Comment: smoking cessation materials not required   Clinical Intake:  Pre-visit preparation completed: Yes  Pain : (generalized arthritis pain in various joints)     BMI - recorded: 49.16 Nutritional Status: BMI > 30  Obese Nutritional Risks: None Diabetes: Yes CBG done?: No Did pt. bring in CBG monitor from home?: No   Nutrition Risk Assessment:  Has the patient had any N/V/D within the last 2 months?  No  Does the patient have any non-healing wounds?  No  Has the patient had any unintentional weight loss or weight gain?  No   Diabetes:  Is the patient diabetic?  Yes  If diabetic, was a CBG obtained today?  No  Did the patient bring in their glucometer from home?  No  How often do you monitor your CBG's? Daily fasting, today 131 - 7 day avg 132.   Financial Strains and Diabetes Management:  Are you having any financial strains with the device, your supplies or your medication? No .  Does the patient want to be seen by Chronic Care Management for management of their diabetes?  No  Would the patient like to be referred to a Nutritionist or for Diabetic Management?  No   Diabetic Exams:  Diabetic Eye Exam: Completed 04/17/19 negative retinopathy.   Diabetic  Foot Exam: Completed 04/14/19.   How often do you need to have someone help you when you read instructions, pamphlets, or other written materials from your doctor or pharmacy?: 1 - Never  Interpreter Needed?: No  Information entered by :: Clemetine Marker LPN  Past Medical History:  Diagnosis Date  . Asthma   . Diabetes mellitus without complication ([place])   . Gout   . Hyperlipidemia   . Hypertension   . Iron deficiency anemia 04/20/2018  . PONV (postoperative nausea and vomiting)   . Shortness of breath  dyspnea   . Sleep apnea    Past Surgical History:  Procedure Laterality Date  . APPENDECTOMY    . CHOLECYSTECTOMY  1977  . COLONOSCOPY WITH PROPOFOL N/A 01/21/2016   Procedure: COLONOSCOPY WITH PROPOFOL;  Surgeon: Lucilla Lame, MD;  Location: [facility] ENDOSCOPY;  Service: Endoscopy;  Laterality: N/A;  . DILATION AND CURETTAGE OF UTERUS     Due to Amenorrhea  . ESOPHAGOGASTRODUODENOSCOPY (EGD) WITH PROPOFOL N/A 08/16/2018   Procedure: ESOPHAGOGASTRODUODENOSCOPY (EGD) WITH PROPOFOL;  Surgeon: Lucilla Lame, MD;  Location: [facility] ENDOSCOPY;  Service: Endoscopy;  Laterality: N/A;  . [place]  . HEMORROIDECTOMY    . HERNIA REPAIR  2011  . Temporal Area Excision Biopsy     For Birth Mark Changes, Negative Pathology  . TONSILLECTOMY AND ADENOIDECTOMY     Family History  Problem Relation Age of Onset  . Cancer Mother        brain tumor  . Kidney disease Mother   . Hypertension Mother   . Heart disease Father   . COPD Father   . Hypertension Sister   . Arthritis Sister   . Cancer Sister   . Heart murmur Sister   . GER disease Sister   . Lupus Sister   . Diabetes Sister   . Arthritis Sister   . Osteopenia Sister   . Heart disease Sister   . Hypertension Sister   . Breast cancer Maternal Aunt 70  . Leukemia Maternal Aunt    Social History   Socioeconomic History  . Marital status: Divorced    Spouse name: Not on file  . Number of children: 0  . Years of education: Not on file  . Highest education level: 12th grade  Occupational History  . Occupation: Retired  Scientific laboratory technician  . Financial resource strain: Not hard at all  . Food insecurity    Worry: Never true    Inability: Never true  . Transportation needs    Medical: No    Non-medical: No  Tobacco Use  . Smoking status: Former Smoker    Packs/day: 2.00    Years: 15.00    Pack years: 30.00    Types: Cigarettes    Quit date: 1983    Years since quitting: 37.6  . Smokeless tobacco: Never Used  . Tobacco  comment: smoking cessation materials not required  Substance and Sexual Activity  . Alcohol use: Not Currently    Alcohol/week: 0.0 standard drinks  . Drug use: No  . Sexual activity: Not Currently  Lifestyle  . Physical activity    Days per week: 0 days    Minutes per session: 0 min  . Stress: Not at all  Relationships  . Social Herbalist on phone: Patient refused    Gets together: Patient refused    Attends religious service: Patient refused    Active member of club or organization: Patient refused  Attends meetings of clubs or organizations: Patient refused    Relationship status: Divorced  Other Topics Concern  . Not on file  Social History Narrative  . Not on file    Outpatient Encounter Medications as of 06/20/2019  Medication Sig  . acetaminophen (TYLENOL) 500 MG tablet Take 1 tablet (500 mg total) by mouth every 6 (six) hours as needed. (Patient taking differently: Take 1,000 mg by mouth every 8 (eight) hours. )  . allopurinol (ZYLOPRIM) 300 MG tablet Take 1 tablet (300 mg total) by mouth daily.  [name] Kitchen atorvastatin (LIPITOR) 40 MG tablet Daily for cholesterol  . benazepril (LOTENSIN) 20 MG tablet Take 1 tablet (20 mg total) by mouth daily.  . Blood Glucose Monitoring Suppl (TRUE METRIX AIR GLUCOSE METER) w/Device KIT 1 each by Does not apply route 1 day or 1 dose.  . diclofenac sodium (VOLTAREN) 1 % GEL APPLY 4 GRAMS TOPICALLY TO BOTH KNEES 4 TIMES DAILY FOR OSTEOARTHRITIS  . Dulaglutide (TRULICITY) 1.5 WC/3.7SE SOPN Inject 1.5 mg into the skin once a week.  . furosemide (LASIX) 40 MG tablet Take 1 tablet (40 mg total) by mouth daily. TAKE 1 TABLET 2 TIMES DAILYAS NEEDED  . gabapentin (NEURONTIN) 300 MG capsule 1 in am, 1 in pm and 2 at night  . glucose blood test strip Use as instructed (Patient taking differently: 1 each. Use as instructed One Touch Delica Lancets)  . Insulin Pen Needle 32G X 6 MM MISC 1 each by Does not apply route 4 (four) times daily.  .  Lancets (ONETOUCH ULTRASOFT) lancets Use as instructed  . potassium chloride SA (K-DUR) 20 MEQ tablet Take 1-2 tablets (20-40 mEq total) by mouth daily.  [name] Kitchen triamcinolone ointment (KENALOG) 0.1 % APPLY A PEA SIZE AMOUNT EXTERNALLY TO AREA EVERY OTHER DAY  . Vitamin D, Cholecalciferol, 1000 UNITS TABS Take 1 capsule by mouth daily.  [name] Kitchen albuterol (ACCUNEB) 1.25 MG/3ML nebulizer solution Take 3 mLs (1.25 mg total) by nebulization 2 (two) times daily. (Patient not taking: Reported on 06/20/2019)  . budesonide (PULMICORT) 0.25 MG/2ML nebulizer solution Take 2 mLs (0.25 mg total) by nebulization 2 (two) times daily. (Patient not taking: Reported on 06/20/2019)  . dexamethasone (DECADRON) 0.1 % ophthalmic solution INSTILL 1 DROP LEY D PRN  . [DISCONTINUED] colchicine 0.6 MG tablet Take 1 tablet by mouth 2 (two) times daily. Takes as needed for flares  . [DISCONTINUED] metFORMIN (GLUCOPHAGE) 500 MG tablet TAKE 1 TABLET BY MOUTH IN THE MORNING AND 2 TABLETS IN THE EVENING (Patient not taking: Reported on 06/20/2019)   No facility-administered encounter medications on file as of 06/20/2019.     Activities of Daily Living In your present state of health, do you have any difficulty performing the following activities: 06/20/2019 04/14/2019  Hearing? N N  Comment declines hearing aids -  Vision? N Y  Difficulty concentrating or making decisions? N N  Walking or climbing stairs? N Y  Dressing or bathing? N N  Doing errands, shopping? N N  Preparing Food and eating ? N -  Using the Toilet? N -  In the past six months, have you accidently leaked urine? N -  Do you have problems with loss of bowel control? N -  Managing your Medications? N -  Managing your Finances? N -  Housekeeping or managing your Housekeeping? N -  Some recent data might be hidden    Patient Care Team: Steele Sizer, MD as PCP - General (Family Medicine) Bryson Ha, OD  as Set designer (Optometry) Sharlet Salina, MD as  Consulting Physician (Physical Medicine and Rehabilitation) Diamond Nickel, DO as Consulting Physician (Sports Medicine) Cathi Roan, [facility] as Case Manager (Pharmacist) Benedetto Goad, RN as Case Manager    Assessment:   This is a routine wellness examination for Jeani.  Exercise Activities and Dietary recommendations Current Exercise Habits: The patient does not participate in regular exercise at present, Exercise limited by: respiratory conditions(s)  Goals   None     Fall Risk Fall Risk  06/20/2019 04/14/2019 12/08/2018 09/09/2018 08/08/2018  Falls in the past year? 0 0 0 0 No  Number falls in past yr: 0 0 0 - -  Injury with Fall? 0 0 0 - -  Risk for fall due to : - - - - -  Risk for fall due to: Comment - - - - -   FALL RISK PREVENTION PERTAINING TO THE HOME:  Any stairs in or around the home? No   Ramp installed If so, do they handrails? No   Home free of loose throw rugs in walkways, pet beds, electrical cords, etc? Yes  Adequate lighting in your home to reduce risk of falls? Yes   ASSISTIVE DEVICES UTILIZED TO PREVENT FALLS:  Life alert? No  Use of a cane, walker or w/c? Yes  Grab bars in the bathroom? Yes  Shower chair or bench in shower? Yes  Elevated toilet seat or a handicapped toilet? Yes   DME ORDERS:  DME order needed?  No   TIMED UP AND GO:  Was the test performed? No . Telephonic visit.   Education: Fall risk prevention has been discussed.  Intervention(s) required? No    Depression Screen PHQ 2/9 Scores 06/20/2019 04/14/2019 08/08/2018 06/17/2018  PHQ - 2 Score 0 0 0 0  PHQ- 9 Score - 3 3 0     Cognitive Function     6CIT Screen 06/20/2019 06/17/2018 06/14/2017  What Year? 0 points 0 points 0 points  What month? 0 points 0 points 0 points  What time? 0 points 0 points 0 points  Count back from 20 0 points 0 points 0 points  Months in reverse 0 points 0 points 4 points  Repeat phrase 0 points 2 points 4 points  Total Score 0 2 8     Immunization History  Administered Date(s) Administered  . Influenza, High Dose Seasonal PF 06/19/2015, 06/29/2016, 06/14/2017, 06/17/2018  . Influenza-Unspecified 06/17/2011  . Pneumococcal Conjugate-13 11/24/2011, 11/23/2013, 04/09/2017  . Pneumococcal Polysaccharide-23 08/30/2009, 08/30/2009  . Td 08/30/2009  . Tdap 08/30/2009  . Zoster 02/12/2011    Qualifies for Shingles Vaccine? Yes  Zostavax completed 2012. Due for Shingrix. Education has been provided regarding the importance of this vaccine. Pt has been advised to call insurance company to determine out of pocket expense. Advised may also receive vaccine at local pharmacy or Health Dept. Verbalized acceptance and understanding.  Tdap: Up to date  Flu Vaccine: Up to date  Pneumococcal Vaccine: Up to date   Screening Tests Health Maintenance  Topic Date Due  . INFLUENZA VACCINE  05/20/2019  . MAMMOGRAM  08/09/2019  . TETANUS/TDAP  08/31/2019  . HEMOGLOBIN A1C  10/14/2019  . FOOT EXAM  04/13/2020  . OPHTHALMOLOGY EXAM  04/16/2020  . COLONOSCOPY  01/20/2026  . DEXA SCAN  Completed  . Hepatitis C Screening  Completed  . PNA vac Low Risk Adult  Completed    Cancer Screenings:  Colorectal Screening: Completed 01/21/16. Repeat  every 10 years  Mammogram: Completed 08/08/18. Repeat every year;  Ordered today. Pt provided with contact information and advised to call to schedule appt.   Bone Density: Completed 08/05/17. Results reflect NORMAL. Repeat every 2 years. Ordered today. Pt provided with contact information and advised to call to schedule appt.   Lung Cancer Screening: (Low Dose CT Chest recommended if Age 47-80 years, 30 pack-year currently smoking OR have quit w/in 15years.) does not qualify.   Additional Screening:  Hepatitis C Screening: does qualify; Completed 07/14/12  Vision Screening: Recommended annual ophthalmology exams for early detection of glaucoma and other disorders of the eye. Is the patient up  to date with their annual eye exam?  Yes  Who is the provider or what is the name of the office in which the pt attends annual eye exams? [facility] Screening: Recommended annual dental exams for proper oral hygiene  Community Resource Referral:  CRR required this visit?  No      Plan:     I have personally reviewed and addressed the Medicare Annual Wellness questionnaire and have noted the following in the patient's chart:  A. Medical and social history B. Use of alcohol, tobacco or illicit drugs  C. Current medications and supplements D. Functional ability and status E.  Nutritional status F.  Physical activity G. Advance directives H. List of other physicians I.  Hospitalizations, surgeries, and ER visits in previous 12 months J.  [place] such as hearing and vision if needed, cognitive and depression  L. Referrals and appointments   In addition, I have reviewed and discussed with patient certain preventive protocols, quality metrics, and best practice recommendations. A written personalized care plan for preventive services as well as general preventive health recommendations were provided to patient.   Signed,  Clemetine Marker, LPN Nurse Health Advisor   Nurse Notes: pt doing well and appreciative of visit today

## 2019-06-22 DIAGNOSIS — R69 Illness, unspecified: Secondary | ICD-10-CM | POA: Diagnosis not present

## 2019-07-07 ENCOUNTER — Other Ambulatory Visit: Payer: Self-pay | Admitting: Family Medicine

## 2019-07-07 DIAGNOSIS — L409 Psoriasis, unspecified: Secondary | ICD-10-CM

## 2019-07-18 DIAGNOSIS — J449 Chronic obstructive pulmonary disease, unspecified: Secondary | ICD-10-CM | POA: Diagnosis not present

## 2019-08-14 ENCOUNTER — Other Ambulatory Visit: Payer: Self-pay

## 2019-08-14 ENCOUNTER — Encounter: Payer: Self-pay | Admitting: Family Medicine

## 2019-08-14 ENCOUNTER — Ambulatory Visit (INDEPENDENT_AMBULATORY_CARE_PROVIDER_SITE_OTHER): Payer: Medicare HMO | Admitting: Family Medicine

## 2019-08-14 VITALS — BP 110/60 | HR 69 | Temp 96.2°F | Resp 16 | Ht 62.0 in | Wt 268.9 lb

## 2019-08-14 DIAGNOSIS — E669 Obesity, unspecified: Secondary | ICD-10-CM | POA: Diagnosis not present

## 2019-08-14 DIAGNOSIS — E1169 Type 2 diabetes mellitus with other specified complication: Secondary | ICD-10-CM

## 2019-08-14 DIAGNOSIS — N1831 Chronic kidney disease, stage 3a: Secondary | ICD-10-CM

## 2019-08-14 DIAGNOSIS — E1122 Type 2 diabetes mellitus with diabetic chronic kidney disease: Secondary | ICD-10-CM | POA: Diagnosis not present

## 2019-08-14 DIAGNOSIS — I10 Essential (primary) hypertension: Secondary | ICD-10-CM | POA: Diagnosis not present

## 2019-08-14 DIAGNOSIS — N183 Chronic kidney disease, stage 3 unspecified: Secondary | ICD-10-CM

## 2019-08-14 DIAGNOSIS — M170 Bilateral primary osteoarthritis of knee: Secondary | ICD-10-CM

## 2019-08-14 DIAGNOSIS — Z8719 Personal history of other diseases of the digestive system: Secondary | ICD-10-CM | POA: Insufficient documentation

## 2019-08-14 DIAGNOSIS — R600 Localized edema: Secondary | ICD-10-CM | POA: Diagnosis not present

## 2019-08-14 DIAGNOSIS — E1159 Type 2 diabetes mellitus with other circulatory complications: Secondary | ICD-10-CM

## 2019-08-14 DIAGNOSIS — E785 Hyperlipidemia, unspecified: Secondary | ICD-10-CM | POA: Diagnosis not present

## 2019-08-14 DIAGNOSIS — M109 Gout, unspecified: Secondary | ICD-10-CM

## 2019-08-14 DIAGNOSIS — E119 Type 2 diabetes mellitus without complications: Secondary | ICD-10-CM

## 2019-08-14 LAB — POCT GLYCOSYLATED HEMOGLOBIN (HGB A1C): Hemoglobin A1C: 6.5 % — AB (ref 4.0–5.6)

## 2019-08-14 MED ORDER — FUROSEMIDE 40 MG PO TABS: 40.0000 mg | ORAL_TABLET | Freq: Every day | ORAL | 3 refills | Status: DC

## 2019-08-14 MED ORDER — POTASSIUM CHLORIDE CRYS ER 20 MEQ PO TBCR: 20.0000 meq | EXTENDED_RELEASE_TABLET | Freq: Every day | ORAL | 0 refills | Status: DC

## 2019-08-14 MED ORDER — BENAZEPRIL HCL 20 MG PO TABS: 20.0000 mg | ORAL_TABLET | Freq: Every day | ORAL | 3 refills | Status: DC

## 2019-08-14 MED ORDER — GLUCOSE BLOOD VI STRP: ORAL_STRIP | 12 refills | Status: DC

## 2019-08-14 MED ORDER — DICLOFENAC SODIUM 1 % TD GEL: TRANSDERMAL | 2 refills | Status: DC

## 2019-08-14 NOTE — Progress Notes (Signed)
Name: Amber Avery   MRN: [id number]    DOB: 05-Jul-1946   Date:08/14/2019       Progress Note  Subjective  Chief Complaint  Chief Complaint  Patient presents with  . Diabetes  . Hypertension  . Dyslipidemia  . Asthma    HPI  Dyslipidemia: taking statin therapy, no side effects, no chest painor myalgia, we will recheck labs next visit   AsthmaMild intermittent: she states she is doing much better since she started showering daily, she has a cough daily, but only during her shower, no wheezing or SOB.She uses albuterol very seldom.  Unchanged   DM with renal manifestation: HgbA1C was down to 6.9%, 7.2,,7.0%%,6.3%,6.1%,6.5%,6.6% , 6.3% down to 6% and today is 6.5% She is off metformin since Summer 2020 because A1C was down to 6 % and today is 6.5% , and Victoza since 11/2016, but we switched to trulicity because of cost, she is tolerating well,  no polyphagia, polyuria or polydipsia .  Obesity: shelost 4 lbs since last visit, she is still walking up about 4 thousand per day    OSA: she wear machine every night, she has a new mask and uses oxygen with CPAP. She is compliant  Right thyroid nodule; seen by Endo at [facility], biopsy done and negative.She states last visit there was not need for biopsy No dysphagia  Gait instability: she uses a cane,they moved mailbox near her house and is doing well. Still has pain on both knees from OA.She has a walk in shower and a ramp now and it has helped her to be more independent, she has daily pain but denies any falls , still using a cane    OA : shesaw Ortho, Dr. Claretha Cooper he advised her not to have surgery because of her weight, she had one round of hyaluronan injection without help and it was too expensive. She is still taking Tylenol  She has effusion when more active, no redness or increase in warmth. She has a hoveround, uses walker and sometimes cane. She has a fit bit and is moving more   Iron deficiency  anemia: last level was better, under the care of Dr. Tasia Catchings. We will recheck in Feb   Patient Active Problem List   Diagnosis Date Noted  . Angiodysplasia of stomach and duodenum   . Acute gastritis with hemorrhage   . Iron deficiency anemia 04/20/2018  . Anemia, unspecified 04/12/2017  . Lichen sclerosus of female genitalia 06/29/2016  . Primary osteoarthritis of both knees 04/07/2016  . Special screening for malignant neoplasms, colon   . Right thyroid nodule 06/26/2015  . Asthma, intermittent 04/03/2015  . Benign essential HTN 04/03/2015  . Carpal tunnel syndrome 04/03/2015  . Chronic kidney disease (CKD), stage II (mild) 04/03/2015  . Controlled gout 04/03/2015  . Arteriosclerosis of coronary artery 04/03/2015  . Diabetes mellitus with renal manifestations, controlled ([place]) 04/03/2015  . Dyslipidemia 04/03/2015  . Edema extremities 04/03/2015  . Elevated sedimentation rate 04/03/2015  . Knee pain 04/03/2015  . Lumbar radiculitis 04/03/2015  . Obstructive apnea 04/03/2015  . Lumbosacral spondylosis 04/03/2015  . Osteoarthritis, chronic 04/03/2015  . Psoriasis 04/03/2015  . Vitamin D deficiency 04/03/2015  . Varicose veins 04/03/2015  . Goiter 12/23/2014  . Degeneration of intervertebral disc of lumbar region 08/23/2014  . Corns and callosity 06/11/2009  . Extreme obesity 04/11/2007    Past Surgical History:  Procedure Laterality Date  . APPENDECTOMY    . CHOLECYSTECTOMY  1977  . COLONOSCOPY WITH  PROPOFOL N/A 01/21/2016   Procedure: COLONOSCOPY WITH PROPOFOL;  Surgeon: Lucilla Lame, MD;  Location: [facility] ENDOSCOPY;  Service: Endoscopy;  Laterality: N/A;  . DILATION AND CURETTAGE OF UTERUS     Due to Amenorrhea  . ESOPHAGOGASTRODUODENOSCOPY (EGD) WITH PROPOFOL N/A 08/16/2018   Procedure: ESOPHAGOGASTRODUODENOSCOPY (EGD) WITH PROPOFOL;  Surgeon: Lucilla Lame, MD;  Location: [facility] ENDOSCOPY;  Service: Endoscopy;  Laterality: N/A;  . [place]  . HEMORROIDECTOMY     . HERNIA REPAIR  2011  . Temporal Area Excision Biopsy     For Birth Mark Changes, Negative Pathology  . TONSILLECTOMY AND ADENOIDECTOMY      Family History  Problem Relation Age of Onset  . Cancer Mother        brain tumor  . Kidney disease Mother   . Hypertension Mother   . Heart disease Father   . COPD Father   . Hypertension Sister   . Arthritis Sister   . Cancer Sister   . Heart murmur Sister   . GER disease Sister   . Lupus Sister   . Diabetes Sister   . Arthritis Sister   . Osteopenia Sister   . Heart disease Sister   . Hypertension Sister   . Breast cancer Maternal Aunt 70  . Leukemia Maternal Aunt     Social History   Socioeconomic History  . Marital status: Divorced    Spouse name: Not on file  . Number of children: 0  . Years of education: Not on file  . Highest education level: 12th grade  Occupational History  . Occupation: Retired  Scientific laboratory technician  . Financial resource strain: Not hard at all  . Food insecurity    Worry: Never true    Inability: Never true  . Transportation needs    Medical: No    Non-medical: No  Tobacco Use  . Smoking status: Former Smoker    Packs/day: 2.00    Years: 15.00    Pack years: 30.00    Types: Cigarettes    Quit date: 1983    Years since quitting: 37.8  . Smokeless tobacco: Never Used  . Tobacco comment: smoking cessation materials not required  Substance and Sexual Activity  . Alcohol use: Not Currently    Alcohol/week: 0.0 standard drinks  . Drug use: No  . Sexual activity: Not Currently  Lifestyle  . Physical activity    Days per week: 0 days    Minutes per session: 0 min  . Stress: Not at all  Relationships  . Social Herbalist on phone: Patient refused    Gets together: Patient refused    Attends religious service: Patient refused    Active member of club or organization: Patient refused    Attends meetings of clubs or organizations: Patient refused    Relationship status: Divorced  .  Intimate partner violence    Fear of current or ex partner: No    Emotionally abused: No    Physically abused: No    Forced sexual activity: No  Other Topics Concern  . Not on file  Social History Narrative  . Not on file     Current Outpatient Medications:  .  acetaminophen (TYLENOL) 500 MG tablet, Take 1 tablet (500 mg total) by mouth every 6 (six) hours as needed. (Patient taking differently: Take 1,000 mg by mouth every 8 (eight) hours. ), Disp: 480 tablet, Rfl: 0 .  albuterol (ACCUNEB) 1.25 MG/3ML nebulizer solution, Take 3  mLs (1.25 mg total) by nebulization 2 (two) times daily., Disp: 75 mL, Rfl: 2 .  allopurinol (ZYLOPRIM) 300 MG tablet, Take 1 tablet (300 mg total) by mouth daily., Disp: 90 tablet, Rfl: 3 .  atorvastatin (LIPITOR) 40 MG tablet, Daily for cholesterol, Disp: 90 tablet, Rfl: 3 .  benazepril (LOTENSIN) 20 MG tablet, Take 1 tablet (20 mg total) by mouth daily., Disp: 90 tablet, Rfl: 3 .  Blood Glucose Monitoring Suppl (TRUE METRIX AIR GLUCOSE METER) w/Device KIT, 1 each by Does not apply route 1 day or 1 dose., Disp: 1 kit, Rfl: 0 .  budesonide (PULMICORT) 0.25 MG/2ML nebulizer solution, Take 2 mLs (0.25 mg total) by nebulization 2 (two) times daily., Disp: 60 mL, Rfl: 2 .  dexamethasone (DECADRON) 0.1 % ophthalmic solution, INSTILL 1 DROP LEY D PRN, Disp: , Rfl:  .  diclofenac sodium (VOLTAREN) 1 % GEL, APPLY 4 GRAMS TOPICALLY TO BOTH KNEES 4 TIMES DAILY FOR OSTEOARTHRITIS, Disp: 100 g, Rfl: 2 .  Dulaglutide (TRULICITY) 1.5 MY/1.1ZN SOPN, Inject 1.5 mg into the skin once a week., Disp: 12 pen, Rfl: 3 .  furosemide (LASIX) 40 MG tablet, Take 1 tablet (40 mg total) by mouth daily. TAKE 1 TABLET 2 TIMES DAILYAS NEEDED, Disp: 90 tablet, Rfl: 3 .  gabapentin (NEURONTIN) 300 MG capsule, 1 in am, 1 in pm and 2 at night, Disp: 360 capsule, Rfl: 2 .  glucose blood test strip, Use as instructed (Patient taking differently: 1 each. Use as instructed One Touch Delica Lancets),  Disp: 100 each, Rfl: 12 .  Insulin Pen Needle 32G X 6 MM MISC, 1 each by Does not apply route 4 (four) times daily., Disp: 200 each, Rfl: 2 .  Lancets (ONETOUCH ULTRASOFT) lancets, Use as instructed, Disp: 100 each, Rfl: 12 .  potassium chloride SA (K-DUR) 20 MEQ tablet, Take 1-2 tablets (20-40 mEq total) by mouth daily., Disp: 180 tablet, Rfl: 3 .  triamcinolone ointment (KENALOG) 0.1 %, APPLY A PEA SIZE AMOUNT EXTERNALLY TO AREA EVERY OTHER DAY., Disp: 90 g, Rfl: 1 .  Vitamin D, Cholecalciferol, 1000 UNITS TABS, Take 1 capsule by mouth daily., Disp: , Rfl:   Allergies  Allergen Reactions  . Codeine     I personally reviewed active problem list, medication list, allergies, family history, social history, health maintenance with the patient/caregiver today.   ROS  Constitutional: Negative for fever or significant weight change.  Respiratory: Negative for cough and shortness of breath.   Cardiovascular: Negative for chest pain or palpitations.  Gastrointestinal: Negative for abdominal pain, no bowel changes.  Musculoskeletal: Positive  for gait problem and intermittent  joint swelling.  Skin: Negative for rash.  Neurological: Negative for dizziness or headache.  No other specific complaints in a complete review of systems (except as listed in HPI above).  Objective  Vitals:   08/14/19 0942  BP: 110/60  Pulse: 69  Resp: 16  Temp: (!) 96.2 F (35.7 C)  TempSrc: Temporal  SpO2: 99%  Weight: 268 lb 14.4 oz (122 kg)  Height: 5' 2"  (1.575 m)    Body mass index is 49.18 kg/m.  Physical Exam  Constitutional: Patient appears well-developed and well-nourished. Obese  No distress.  HEENT: head atraumatic, normocephalic, pupils equal and reactive to light Cardiovascular: Normal rate, regular rhythm and normal heart sounds.  No murmur heard. No BLE edema. Pulmonary/Chest: Effort normal and breath sounds normal. No respiratory distress. Abdominal: Soft.  There is no  tenderness. Psychiatric: Patient has a normal  mood and affect. behavior is normal. Judgment and thought content normal. Muscular Skeletal: crepitus with extension of both knees , using a cane  Recent Results (from the past 2160 hour(s))  POCT HgB A1C     Status: Abnormal   Collection Time: 08/14/19  9:44 AM  Result Value Ref Range   Hemoglobin A1C 6.5 (A) 4.0 - 5.6 %   HbA1c POC (<> result, manual entry)     HbA1c, POC (prediabetic range)     HbA1c, POC (controlled diabetic range)       PHQ2/9: Depression screen [facility] 2/9 08/14/2019 06/20/2019 04/14/2019 08/08/2018 06/17/2018  Decreased Interest 0 0 0 0 0  Down, Depressed, Hopeless 0 0 0 0 0  PHQ - 2 Score 0 0 0 0 0  Altered sleeping 0 - 0 1 0  Tired, decreased energy 0 - 2 2 0  Change in appetite 0 - 0 0 0  Feeling bad or failure about yourself  0 - 0 0 0  Trouble concentrating 0 - 1 0 0  Moving slowly or fidgety/restless 0 - 0 0 0  Suicidal thoughts 0 - 0 0 0  PHQ-9 Score 0 - 3 3 0  Difficult doing work/chores - - Somewhat difficult - Not difficult at all    phq 9 is negative   Fall Risk: Fall Risk  08/14/2019 06/20/2019 04/14/2019 12/08/2018 09/09/2018  Falls in the past year? 0 0 0 0 0  Number falls in past yr: 0 0 0 0 -  Injury with Fall? 0 0 0 0 -  Risk for fall due to : - - - - -  Risk for fall due to: Comment - - - - -    Functional Status Survey: Is the patient deaf or have difficulty hearing?: No Does the patient have difficulty seeing, even when wearing glasses/contacts?: No Does the patient have difficulty concentrating, remembering, or making decisions?: No Does the patient have difficulty walking or climbing stairs?: No Does the patient have difficulty dressing or bathing?: No Does the patient have difficulty doing errands alone such as visiting a doctor's office or shopping?: No   Assessment & Plan  1. Controlled type 2 diabetes mellitus with stage 3 chronic kidney disease, without long-term current use of  insulin (HCC)  - POCT HgB A1C  2. Benign essential HTN  - potassium chloride SA (KLOR-CON) 20 MEQ tablet; Take 1 tablet (20 mEq total) by mouth daily.  Dispense: 90 tablet; Refill: 0 - benazepril (LOTENSIN) 20 MG tablet; Take 1 tablet (20 mg total) by mouth daily.  Dispense: 90 tablet; Refill: 3  3. Bilateral edema of lower extremity  - furosemide (LASIX) 40 MG tablet; Take 1 tablet (40 mg total) by mouth daily.  Dispense: 90 tablet; Refill: 3  4. Chronic kidney disease, stage III (moderate)  - benazepril (LOTENSIN) 20 MG tablet; Take 1 tablet (20 mg total) by mouth daily.  Dispense: 90 tablet; Refill: 3  5. Bilateral primary osteoarthritis of knee  - diclofenac sodium (VOLTAREN) 1 % GEL; APPLY 4 GRAMS TOPICALLY TO BOTH KNEES 4 TIMES DAILY FOR OSTEOARTHRITIS  Dispense: 100 g; Refill: 2  6. Controlled gout  Under control   7. Dyslipidemia  On statin therapy   8. Morbid obesity with body mass index (BMI) of 40.0 or higher (HCC)  Lost weight , 4 lbs since last visit   9. History of gastritis   10. Obesity, diabetes, and hypertension syndrome ([place])

## 2019-08-15 DIAGNOSIS — R69 Illness, unspecified: Secondary | ICD-10-CM | POA: Diagnosis not present

## 2019-08-16 ENCOUNTER — Ambulatory Visit
Admission: RE | Admit: 2019-08-16 | Discharge: 2019-08-16 | Disposition: A | Discharge status: Home / Self Care | Payer: Medicare HMO | Source: Ambulatory Visit | Attending: Family Medicine | Admitting: Family Medicine

## 2019-08-16 DIAGNOSIS — Z780 Asymptomatic menopausal state: Secondary | ICD-10-CM | POA: Diagnosis present

## 2019-08-16 DIAGNOSIS — Z1231 Encounter for screening mammogram for malignant neoplasm of breast: Secondary | ICD-10-CM | POA: Insufficient documentation

## 2019-08-16 DIAGNOSIS — M85851 Other specified disorders of bone density and structure, right thigh: Secondary | ICD-10-CM | POA: Diagnosis not present

## 2019-08-17 DIAGNOSIS — J449 Chronic obstructive pulmonary disease, unspecified: Secondary | ICD-10-CM | POA: Diagnosis not present

## 2019-08-21 ENCOUNTER — Other Ambulatory Visit: Payer: Self-pay

## 2019-08-21 DIAGNOSIS — IMO0002 Reserved for concepts with insufficient information to code with codable children: Secondary | ICD-10-CM

## 2019-08-21 DIAGNOSIS — E1129 Type 2 diabetes mellitus with other diabetic kidney complication: Secondary | ICD-10-CM

## 2019-08-23 MED ORDER — TRUE METRIX AIR GLUCOSE METER W/DEVICE KIT: 1.0000 | PACK | 0 refills | Status: DC

## 2019-08-24 DIAGNOSIS — G4733 Obstructive sleep apnea (adult) (pediatric): Secondary | ICD-10-CM | POA: Diagnosis not present

## 2019-09-12 ENCOUNTER — Other Ambulatory Visit: Payer: Self-pay

## 2019-09-12 DIAGNOSIS — L409 Psoriasis, unspecified: Secondary | ICD-10-CM

## 2019-09-12 NOTE — Telephone Encounter (Signed)
Refill request for general medication. Triamcinolone ointment   Last office visit  08/14/2019   Follow up on 12/13/2018

## 2019-09-13 MED ORDER — TRIAMCINOLONE ACETONIDE 0.1 % EX OINT: TOPICAL_OINTMENT | CUTANEOUS | 1 refills | Status: DC

## 2019-09-17 DIAGNOSIS — J449 Chronic obstructive pulmonary disease, unspecified: Secondary | ICD-10-CM | POA: Diagnosis not present

## 2019-09-29 ENCOUNTER — Telehealth: Payer: Self-pay | Admitting: Family Medicine

## 2019-09-29 ENCOUNTER — Ambulatory Visit: Payer: Self-pay | Admitting: Pharmacist

## 2019-09-29 DIAGNOSIS — E1122 Type 2 diabetes mellitus with diabetic chronic kidney disease: Secondary | ICD-10-CM

## 2019-09-29 DIAGNOSIS — N1831 Chronic kidney disease, stage 3a: Secondary | ICD-10-CM

## 2019-09-29 NOTE — Chronic Care Management (AMB) (Signed)
Chronic Care Management   Follow Up Note   09/29/2019 Name: MINERVA BLUETT MRN: [id number] DOB: [birth year]  Referred by: Steele Sizer, MD Reason for referral : Chronic Care Management (Trulicity 9622)   SCOTTI MOTTER is a 73 y.o. year old female who is a primary care patient of Steele Sizer, MD. The CCM clinical pharmacist returned patient's call today regarding 2021 medication assistance program. HIPAA identifiers verified.   Review of patient status, including review of consultants reports, relevant laboratory and other test results, and collaboration with appropriate care team members and the patient's provider was performed as part of comprehensive patient evaluation and provision of chronic care management services.     Outpatient Encounter Medications as of 09/29/2019  Medication Sig Note  . acetaminophen (TYLENOL) 500 MG tablet Take 1 tablet (500 mg total) by mouth every 6 (six) hours as needed. (Patient taking differently: Take 1,000 mg by mouth every 8 (eight) hours. )   . albuterol (ACCUNEB) 1.25 MG/3ML nebulizer solution Take 3 mLs (1.25 mg total) by nebulization 2 (two) times daily. 10/20/2018: Patient has not needed to use nebulizer  . allopurinol (ZYLOPRIM) 300 MG tablet Take 1 tablet (300 mg total) by mouth daily.   [name] Kitchen atorvastatin (LIPITOR) 40 MG tablet Daily for cholesterol   . benazepril (LOTENSIN) 20 MG tablet Take 1 tablet (20 mg total) by mouth daily.   . Blood Glucose Monitoring Suppl (TRUE METRIX AIR GLUCOSE METER) w/Device KIT 1 each by Does not apply route 1 day or 1 dose. Checks Fasting Blood Sugar Twice daily Diabetes mellitus with renal manifestations, controlled ([place])      Code: E11.29   . budesonide (PULMICORT) 0.25 MG/2ML nebulizer solution Take 2 mLs (0.25 mg total) by nebulization 2 (two) times daily. 10/20/2018: Patient has not needed   . dexamethasone (DECADRON) 0.1 % ophthalmic solution INSTILL 1 DROP LEY D PRN   . diclofenac sodium (VOLTAREN) 1 %  GEL APPLY 4 GRAMS TOPICALLY TO BOTH KNEES 4 TIMES DAILY FOR OSTEOARTHRITIS   . Dulaglutide (TRULICITY) 1.5 WL/7.9GX SOPN Inject 1.5 mg into the skin once a week.   . furosemide (LASIX) 40 MG tablet Take 1 tablet (40 mg total) by mouth daily.   [name] Kitchen gabapentin (NEURONTIN) 300 MG capsule 1 in am, 1 in pm and 2 at night   . glucose blood test strip Use as instructed   . Insulin Pen Needle 32G X 6 MM MISC 1 each by Does not apply route 4 (four) times daily. 10/20/2018: Trouble with copay  . Lancets (ONETOUCH ULTRASOFT) lancets Use as instructed   . potassium chloride SA (KLOR-CON) 20 MEQ tablet Take 1 tablet (20 mEq total) by mouth daily.   [name] Kitchen triamcinolone ointment (KENALOG) 0.1 % Use BID prn   . Vitamin D, Cholecalciferol, 1000 UNITS TABS Take 1 capsule by mouth daily. 04/04/2015: Received from: External Pharmacy Received Sig:    No facility-administered encounter medications on file as of 09/29/2019.     Goals Addressed            This Visit's Progress   . medication assistnace 2021 (pt-stated)       Current Barriers:  . financial  Pharmacist Clinical Goal(s): Over the next 14 days, [name] will provide the necessary supplementary documents (proof of out of pocket prescription expenditure, proof of household income) needed for medication assistance applications to CCM pharmacist.   Interventions: . CCM pharmacist will apply for medication assistance program for Trulicity made by [employer] and prescribed by  Dr. Ancil Boozer.   Patient Self Care Activities:  [name] Kitchen Gather necessary documents needed to apply for medication assistance  Initial goal documentation        Follow up Telephone follow up appointment with care management team member scheduled for: 3 weeks for application update or sooner pending paperwork from patient  Ruben Reason, PharmD Clinical Pharmacist [place] [phone]

## 2019-09-29 NOTE — Telephone Encounter (Signed)
Returned patient's call regarding Trulicity assistance program re-enrollment for 2021.   Ruben Reason, PharmD Clinical Pharmacist [facility] [phone]

## 2019-09-29 NOTE — Telephone Encounter (Signed)
Copied from [place] [id number]. Topic: General - Other >> Sep 28, 2019  4:11 PM Rainey Pines A wrote: Patient would like a callback in regards to new Trulicity medication prescription being sent to [facility] and is to be dated for 2021. Patient also stated paperwork needs to be sent over for re enrollment. Please advise.

## 2019-10-17 DIAGNOSIS — J449 Chronic obstructive pulmonary disease, unspecified: Secondary | ICD-10-CM | POA: Diagnosis not present

## 2019-10-23 ENCOUNTER — Telehealth: Payer: Self-pay | Admitting: Family Medicine

## 2019-10-23 NOTE — Telephone Encounter (Signed)
The following medication records are no longer available for ordering. Place a new order with a different medication record. diclofenac sodium (VOLTAREN) 1 % GEL

## 2019-10-23 NOTE — Telephone Encounter (Signed)
Pt would like Rx for diclofenac sodium (VOLTAREN) 1 % GEL  Sent to  [place], [place] Phone:  [phone]  Fax:  [phone]     Pt states [facility] too backed up to call and transfer this Rx.  She states it is cheaper for her to get at [facility].

## 2019-10-24 NOTE — Telephone Encounter (Signed)
Patient has a printed prescription for the Voltaren gel, she just wanted to injury how old it was good for since it was prescribed in October 2020. Called [facility] and they informed me it was good for a year. Patient notified.

## 2019-10-24 NOTE — Telephone Encounter (Signed)
Pt returning call to [place], but was unable to get throught to office. Please call pt: [phone]

## 2019-11-11 ENCOUNTER — Other Ambulatory Visit: Payer: Self-pay | Admitting: Family Medicine

## 2019-11-11 DIAGNOSIS — E1122 Type 2 diabetes mellitus with diabetic chronic kidney disease: Secondary | ICD-10-CM

## 2019-11-11 DIAGNOSIS — M5416 Radiculopathy, lumbar region: Secondary | ICD-10-CM

## 2019-11-11 DIAGNOSIS — I10 Essential (primary) hypertension: Secondary | ICD-10-CM

## 2019-11-11 NOTE — Telephone Encounter (Signed)
Requested Prescriptions  Pending Prescriptions Disp Refills  . gabapentin (NEURONTIN) 300 MG capsule [Pharmacy Med Name: Gabapentin 300 MG Oral Capsule] 120 capsule 0    Sig: TAKE 1 CAPSULE BY MOUTH EVERY MORNING, 1 CAPSULE EVERY EVENING AND 2 CAPSULE AT NIGHT     Neurology: Anticonvulsants - gabapentin Passed - 11/11/2019  3:14 PM      Passed - Valid encounter within last 12 months    Recent Outpatient Visits          2 months ago Controlled type 2 diabetes mellitus with stage 3 chronic kidney disease, without long-term current use of insulin ([place])   [facility] [place], Drue Stager, MD   7 months ago Controlled type 2 diabetes mellitus with stage 3 chronic kidney disease, without long-term current use of insulin [facility])   [facility] [place], Drue Stager, MD   11 months ago Controlled type 2 diabetes mellitus with stage 2 chronic kidney disease, unspecified whether long term insulin use [facility])   [facility] [place], Drue Stager, MD   1 year ago Controlled type 2 diabetes mellitus with stage 2 chronic kidney disease, unspecified whether long term insulin use [facility])   [facility] Steele Sizer, MD   1 year ago Primary osteoarthritis of both knees   [facility] Steele Sizer, MD      Future Appointments            In 1 month Ancil Boozer, Drue Stager, MD [facility] [place]   In 7 months  [place]

## 2019-11-13 MED ORDER — POTASSIUM CHLORIDE CRYS ER 20 MEQ PO TBCR: 20.0000 meq | EXTENDED_RELEASE_TABLET | Freq: Every day | ORAL | 0 refills | Status: DC

## 2019-11-15 ENCOUNTER — Other Ambulatory Visit: Payer: Self-pay | Admitting: Family Medicine

## 2019-11-15 DIAGNOSIS — I10 Essential (primary) hypertension: Secondary | ICD-10-CM

## 2019-11-15 MED ORDER — POTASSIUM CHLORIDE CRYS ER 20 MEQ PO TBCR: 20.0000 meq | EXTENDED_RELEASE_TABLET | Freq: Every day | ORAL | 0 refills | Status: DC

## 2019-11-17 DIAGNOSIS — J449 Chronic obstructive pulmonary disease, unspecified: Secondary | ICD-10-CM | POA: Diagnosis not present

## 2019-11-20 ENCOUNTER — Ambulatory Visit: Payer: Self-pay | Admitting: Pharmacist

## 2019-11-20 DIAGNOSIS — E1122 Type 2 diabetes mellitus with diabetic chronic kidney disease: Secondary | ICD-10-CM

## 2019-11-20 DIAGNOSIS — N1831 Chronic kidney disease, stage 3a: Secondary | ICD-10-CM

## 2019-11-20 NOTE — Chronic Care Management (AMB) (Signed)
  Chronic Care Management   Care Coordination Note  11/20/2019 Name: Amber Avery MRN: [id number] DOB: 1946-05-01  Care coordination: Successful telephone outreach, returning patient's call. HIPAA identifiers verified. Patient has completed her Scientist, forensic for Trulicity assistance and dropped it off at [employer]. I will submit application and upload to media tab on 11/21/19.    Goals Addressed            This Visit's Progress   . medication assistnace 2021 (pt-stated)       Current Barriers:  . financial  Pharmacist Clinical Goal(s): Over the next 14 days, [name] will provide the necessary supplementary documents (proof of out of pocket prescription expenditure, proof of household income) needed for medication assistance applications to CCM pharmacist.   Interventions: . CCM pharmacist will apply for medication assistance program for Trulicity made by [employer] and prescribed by Dr. Ancil Boozer.  [name] Kitchen Updated 2/1: patient provided patient application for Trulicity assistance  Patient Self Care Activities:  [name] Kitchen Gather necessary documents needed to apply for medication assistance  Please see past updates related to this goal by clicking on the "Past Updates" button in the selected goal          Follow up plan: Telephone follow up appointment with care management team member scheduled for: 1 week regarding [employer] assistance status  Ruben Reason, PharmD Clinical Pharmacist [facility] [phone]

## 2019-11-21 ENCOUNTER — Other Ambulatory Visit: Payer: Self-pay | Admitting: Family Medicine

## 2019-11-21 ENCOUNTER — Ambulatory Visit: Payer: Self-pay | Admitting: Pharmacist

## 2019-11-21 DIAGNOSIS — E1122 Type 2 diabetes mellitus with diabetic chronic kidney disease: Secondary | ICD-10-CM

## 2019-11-21 DIAGNOSIS — N1831 Chronic kidney disease, stage 3a: Secondary | ICD-10-CM

## 2019-11-21 MED ORDER — TRULICITY 1.5 MG/0.5ML ~~LOC~~ SOAJ: 1.5000 mg | SUBCUTANEOUS | 3 refills | Status: DC

## 2019-11-24 NOTE — Chronic Care Management (AMB) (Signed)
  Chronic Care Management   Care Coordination Note  11/24/2019 Name: JULINA BARSE MRN: [id number] DOB: [birth year]  Care Coordination: received completed [employer] C[employer] application for Trulicity. Submitted to [employer] and uploaded under media tab.   Goals Addressed            This Visit's Progress   . medication assistnace 2021 (pt-stated)       Current Barriers:  . financial  Pharmacist Clinical Goal(s): Over the next 14 days, [name] will provide the necessary supplementary documents (proof of out of pocket prescription expenditure, proof of household income) needed for medication assistance applications to CCM pharmacist.   Interventions: . CCM pharmacist will apply for medication assistance program for Trulicity made by [employer] and prescribed by Dr. Ancil Boozer.  [name] Kitchen Updated 2/1: patient provided patient application for Trulicity assistance . Updated 2/2: submitted application to [employer] and uploaded to media tab  Patient Self Care Activities:  [name] Kitchen Gather necessary documents needed to apply for medication assistance  Please see past updates related to this goal by clicking on the "Past Updates" button in the selected goal           Follow up plan: Telephone follow up appointment with care management team member scheduled for: 1 week to follow up on application  Ruben Reason, PharmD Clinical Pharmacist [place] [phone]

## 2019-11-28 ENCOUNTER — Ambulatory Visit (INDEPENDENT_AMBULATORY_CARE_PROVIDER_SITE_OTHER): Payer: Medicare HMO | Admitting: Pharmacist

## 2019-11-28 DIAGNOSIS — N183 Chronic kidney disease, stage 3 unspecified: Secondary | ICD-10-CM | POA: Diagnosis not present

## 2019-11-28 DIAGNOSIS — N1831 Chronic kidney disease, stage 3a: Secondary | ICD-10-CM

## 2019-11-28 DIAGNOSIS — E1122 Type 2 diabetes mellitus with diabetic chronic kidney disease: Secondary | ICD-10-CM

## 2019-12-14 ENCOUNTER — Other Ambulatory Visit: Payer: Self-pay

## 2019-12-14 ENCOUNTER — Telehealth: Payer: Self-pay | Admitting: Family Medicine

## 2019-12-14 ENCOUNTER — Ambulatory Visit (INDEPENDENT_AMBULATORY_CARE_PROVIDER_SITE_OTHER): Payer: Medicare HMO | Admitting: Family Medicine

## 2019-12-14 ENCOUNTER — Other Ambulatory Visit: Payer: Self-pay | Admitting: Family Medicine

## 2019-12-14 ENCOUNTER — Encounter: Payer: Self-pay | Admitting: Family Medicine

## 2019-12-14 VITALS — BP 116/60 | HR 78 | Temp 97.1°F | Resp 16 | Ht 62.0 in | Wt 272.0 lb

## 2019-12-14 DIAGNOSIS — N1831 Chronic kidney disease, stage 3a: Secondary | ICD-10-CM | POA: Diagnosis not present

## 2019-12-14 DIAGNOSIS — M109 Gout, unspecified: Secondary | ICD-10-CM

## 2019-12-14 DIAGNOSIS — E538 Deficiency of other specified B group vitamins: Secondary | ICD-10-CM

## 2019-12-14 DIAGNOSIS — E785 Hyperlipidemia, unspecified: Secondary | ICD-10-CM

## 2019-12-14 DIAGNOSIS — G4733 Obstructive sleep apnea (adult) (pediatric): Secondary | ICD-10-CM

## 2019-12-14 DIAGNOSIS — E669 Obesity, unspecified: Secondary | ICD-10-CM

## 2019-12-14 DIAGNOSIS — N183 Chronic kidney disease, stage 3 unspecified: Secondary | ICD-10-CM | POA: Diagnosis not present

## 2019-12-14 DIAGNOSIS — I10 Essential (primary) hypertension: Secondary | ICD-10-CM | POA: Diagnosis not present

## 2019-12-14 DIAGNOSIS — R2681 Unsteadiness on feet: Secondary | ICD-10-CM

## 2019-12-14 DIAGNOSIS — M5416 Radiculopathy, lumbar region: Secondary | ICD-10-CM

## 2019-12-14 DIAGNOSIS — J4520 Mild intermittent asthma, uncomplicated: Secondary | ICD-10-CM | POA: Diagnosis not present

## 2019-12-14 DIAGNOSIS — Z862 Personal history of diseases of the blood and blood-forming organs and certain disorders involving the immune mechanism: Secondary | ICD-10-CM | POA: Diagnosis not present

## 2019-12-14 DIAGNOSIS — E1122 Type 2 diabetes mellitus with diabetic chronic kidney disease: Secondary | ICD-10-CM

## 2019-12-14 DIAGNOSIS — E1169 Type 2 diabetes mellitus with other specified complication: Secondary | ICD-10-CM | POA: Diagnosis not present

## 2019-12-14 DIAGNOSIS — E1159 Type 2 diabetes mellitus with other circulatory complications: Secondary | ICD-10-CM

## 2019-12-14 DIAGNOSIS — M1990 Unspecified osteoarthritis, unspecified site: Secondary | ICD-10-CM

## 2019-12-14 DIAGNOSIS — M65311 Trigger thumb, right thumb: Secondary | ICD-10-CM

## 2019-12-14 DIAGNOSIS — E119 Type 2 diabetes mellitus without complications: Secondary | ICD-10-CM

## 2019-12-14 DIAGNOSIS — Z8719 Personal history of other diseases of the digestive system: Secondary | ICD-10-CM

## 2019-12-14 LAB — POCT GLYCOSYLATED HEMOGLOBIN (HGB A1C): Hemoglobin A1C: 6.6 % — AB (ref 4.0–5.6)

## 2019-12-14 MED ORDER — GABAPENTIN 300 MG PO CAPS: ORAL_CAPSULE | ORAL | 1 refills | Status: DC

## 2019-12-14 MED ORDER — VITAMIN D (CHOLECALCIFEROL) 25 MCG (1000 UT) PO TABS: 1.0000 | ORAL_TABLET | Freq: Every day | ORAL | 1 refills | Status: AC

## 2019-12-14 NOTE — Telephone Encounter (Signed)
Prescription was sent to wrong pharmacy, please send to [facility]

## 2019-12-14 NOTE — Progress Notes (Signed)
Name: Amber Avery   MRN: [id number]    DOB: 09-14-1946   Date:12/14/2019       Progress Note  Subjective  Chief Complaint  Chief Complaint  Patient presents with  . Diabetes  . Dyslipidemia  . Hypertension  . Asthma  . Thumb Pain    both her thumbs have been popping and swelling in the past few days. She has been using paper tape to wrap them which helps with the pain.    HPI  Dyslipidemia: taking statin therapy, no side effects, no chest painor myalgia, we will recheck labs today   AsthmaMild intermittent: she states she is doing much better since she started showering daily, she has a cough daily, but only during her shower, no wheezing or SOB.She uses albuterol very seldom.  Unchanged   DM with renal manifestation: HgbA1C is stable at 9.4% , she is on Trulicity, tolerating medication welll, no polyphagia, polyuria or polydipsia . Continue medication   Obesity: weight is stable, still trying to be active, she likes to bake but using sugar substitute    OSA: she wear machine every night, she has a new mask and uses oxygen with CPAP. Unchanged   Right thyroid nodule; seen by Endo at [facility], biopsy done and negative.Unchanged   Gait instability: she uses a cane,they moved mailbox near her house and is doing well. Still has pain on both knees from OA.She has a walk in shower and a ramp now and it has helped her to be more independent, stable   OA : shesaw Ortho, Dr. Claretha Cooper he advised her not to have surgery because of her weight, she had one round of hyaluronan injection without help and it was too expensive. She is still taking Tylenol She still has intermittent right knee effusion when more active, no redness or increase in warmth. She has a hoveround, uses walker and sometimes cane. She still wears her Fibit and is getting around 4 k per day   Iron deficiency anemia:last level was better, under the care of Dr. Tasia Catchings and had 5 iron infusions,  but no recent  visits, she denies pica, she has history of gastritis but no abdominal pain   Trigger Thumb: right side, discussed referral to Ortho, she will think about it    Patient Active Problem List   Diagnosis Date Noted  . History of gastritis 08/14/2019  . Angiodysplasia of stomach and duodenum   . Iron deficiency anemia 04/20/2018  . Anemia, unspecified 04/12/2017  . Lichen sclerosus of female genitalia 06/29/2016  . Primary osteoarthritis of both knees 04/07/2016  . Special screening for malignant neoplasms, colon   . Right thyroid nodule 06/26/2015  . Asthma, intermittent 04/03/2015  . Benign essential HTN 04/03/2015  . Carpal tunnel syndrome 04/03/2015  . Chronic kidney disease (CKD), stage II (mild) 04/03/2015  . Controlled gout 04/03/2015  . Arteriosclerosis of coronary artery 04/03/2015  . Diabetes mellitus with renal manifestations, controlled ([place]) 04/03/2015  . Dyslipidemia 04/03/2015  . Edema extremities 04/03/2015  . Elevated sedimentation rate 04/03/2015  . Knee pain 04/03/2015  . Lumbar radiculitis 04/03/2015  . Obstructive apnea 04/03/2015  . Lumbosacral spondylosis 04/03/2015  . Osteoarthritis, chronic 04/03/2015  . Psoriasis 04/03/2015  . Vitamin D deficiency 04/03/2015  . Varicose veins 04/03/2015  . Goiter 12/23/2014  . Degeneration of intervertebral disc of lumbar region 08/23/2014  . Corns and callosity 06/11/2009  . Extreme obesity 04/11/2007    Past Surgical History:  Procedure Laterality Date  .  APPENDECTOMY    . CHOLECYSTECTOMY  1977  . COLONOSCOPY WITH PROPOFOL N/A 01/21/2016   Procedure: COLONOSCOPY WITH PROPOFOL;  Surgeon: Lucilla Lame, MD;  Location: [facility] ENDOSCOPY;  Service: Endoscopy;  Laterality: N/A;  . DILATION AND CURETTAGE OF UTERUS     Due to Amenorrhea  . ESOPHAGOGASTRODUODENOSCOPY (EGD) WITH PROPOFOL N/A 08/16/2018   Procedure: ESOPHAGOGASTRODUODENOSCOPY (EGD) WITH PROPOFOL;  Surgeon: Lucilla Lame, MD;  Location: [facility] ENDOSCOPY;  Service:  Endoscopy;  Laterality: N/A;  . [place]  . HEMORROIDECTOMY    . HERNIA REPAIR  2011  . Temporal Area Excision Biopsy     For Birth Mark Changes, Negative Pathology  . TONSILLECTOMY AND ADENOIDECTOMY      Family History  Problem Relation Age of Onset  . Cancer Mother        brain tumor  . Kidney disease Mother   . Hypertension Mother   . Heart disease Father   . COPD Father   . Hypertension Sister   . Arthritis Sister   . Cancer Sister   . Heart murmur Sister   . GER disease Sister   . Lupus Sister   . Diabetes Sister   . Arthritis Sister   . Osteopenia Sister   . Heart disease Sister   . Hypertension Sister   . Breast cancer Maternal Aunt 70  . Leukemia Maternal Aunt     Social History   Tobacco Use  . Smoking status: Former Smoker    Packs/day: 2.00    Years: 15.00    Pack years: 30.00    Types: Cigarettes    Quit date: 1983    Years since quitting: 38.1  . Smokeless tobacco: Never Used  . Tobacco comment: smoking cessation materials not required  Substance Use Topics  . Alcohol use: Not Currently    Alcohol/week: 0.0 standard drinks  . Drug use: No     Current Outpatient Medications:  .  acetaminophen (TYLENOL) 500 MG tablet, Take 1 tablet (500 mg total) by mouth every 6 (six) hours as needed. (Patient taking differently: Take 1,000 mg by mouth every 8 (eight) hours. ), Disp: 480 tablet, Rfl: 0 .  albuterol (ACCUNEB) 1.25 MG/3ML nebulizer solution, Take 3 mLs (1.25 mg total) by nebulization 2 (two) times daily., Disp: 75 mL, Rfl: 2 .  allopurinol (ZYLOPRIM) 300 MG tablet, Take 1 tablet (300 mg total) by mouth daily., Disp: 90 tablet, Rfl: 3 .  atorvastatin (LIPITOR) 40 MG tablet, Daily for cholesterol, Disp: 90 tablet, Rfl: 3 .  benazepril (LOTENSIN) 20 MG tablet, Take 1 tablet (20 mg total) by mouth daily., Disp: 90 tablet, Rfl: 3 .  Blood Glucose Monitoring Suppl (TRUE METRIX AIR GLUCOSE METER) w/Device KIT, 1 each by Does not apply route  1 day or 1 dose. Checks Fasting Blood Sugar Twice daily Diabetes mellitus with renal manifestations, controlled ([place])      Code: E11.29, Disp: 1 kit, Rfl: 0 .  budesonide (PULMICORT) 0.25 MG/2ML nebulizer solution, Take 2 mLs (0.25 mg total) by nebulization 2 (two) times daily., Disp: 60 mL, Rfl: 2 .  dexamethasone (DECADRON) 0.1 % ophthalmic solution, INSTILL 1 DROP LEY D PRN, Disp: , Rfl:  .  diclofenac sodium (VOLTAREN) 1 % GEL, APPLY 4 GRAMS TOPICALLY TO BOTH KNEES 4 TIMES DAILY FOR OSTEOARTHRITIS, Disp: 100 g, Rfl: 2 .  Dulaglutide (TRULICITY) 1.5 GE/3.6OQ SOPN, Inject 1.5 mg into the skin once a week., Disp: 12 pen, Rfl: 3 .  furosemide (LASIX) 40 MG tablet,  Take 1 tablet (40 mg total) by mouth daily., Disp: 90 tablet, Rfl: 3 .  gabapentin (NEURONTIN) 300 MG capsule, TAKE 1 CAPSULE BY MOUTH EVERY MORNING, 1 CAPSULE EVERY EVENING AND 2 CAPSULE AT NIGHT, Disp: 120 capsule, Rfl: 0 .  glucose blood test strip, Use as instructed, Disp: 100 each, Rfl: 12 .  Insulin Pen Needle 32G X 6 MM MISC, 1 each by Does not apply route 4 (four) times daily., Disp: 200 each, Rfl: 2 .  Lancets (ONETOUCH ULTRASOFT) lancets, Use as instructed, Disp: 100 each, Rfl: 12 .  potassium chloride SA (KLOR-CON) 20 MEQ tablet, Take 1 tablet (20 mEq total) by mouth daily., Disp: 90 tablet, Rfl: 0 .  triamcinolone ointment (KENALOG) 0.1 %, Use BID prn, Disp: 90 g, Rfl: 1 .  Vitamin D, Cholecalciferol, 1000 UNITS TABS, Take 1 capsule by mouth daily., Disp: , Rfl:   Allergies  Allergen Reactions  . Codeine     I personally reviewed active problem list, medication list, allergies, family history, social history, health maintenance with the patient/caregiver today.   ROS  Constitutional: Negative for fever or weight change.  Respiratory: Negative for cough and shortness of breath.   Cardiovascular: Negative for chest pain or palpitations.  Gastrointestinal: Negative for abdominal pain, no bowel changes.  Musculoskeletal:  Positive for gait problem and intermittent  joint swelling.  Skin: Negative for rash.  Neurological: Negative for dizziness or headache.  No other specific complaints in a complete review of systems (except as listed in HPI above).  Objective  Vitals:   12/14/19 0959  BP: 116/60  Pulse: 78  Resp: 16  Temp: (!) 97.1 F (36.2 C)  TempSrc: Temporal  SpO2: 95%  Weight: 272 lb (123.4 kg)  Height: 5' 2"  (1.575 m)    Body mass index is 49.75 kg/m.  Physical Exam  Constitutional: Patient appears well-developed and well-nourished. Obese No distress.  HEENT: head atraumatic, normocephalic, pupils equal and reactive to light Cardiovascular: Normal rate, regular rhythm and normal heart sounds.  No murmur heard. Trace  BLE edema. Pulmonary/Chest: Effort normal and breath sounds normal. No respiratory distress. Abdominal: Soft.  There is no tenderness. Psychiatric: Patient has a normal mood and affect. behavior is normal. Judgment and thought content normal.  Recent Results (from the past 2160 hour(s))  POCT HgB A1C     Status: Abnormal   Collection Time: 12/14/19 10:05 AM  Result Value Ref Range   Hemoglobin A1C 6.6 (A) 4.0 - 5.6 %   HbA1c POC (<> result, manual entry)     HbA1c, POC (prediabetic range)     HbA1c, POC (controlled diabetic range)       PHQ2/9: Depression screen [facility] 2/9 12/14/2019 08/14/2019 06/20/2019 04/14/2019 08/08/2018  Decreased Interest 0 0 0 0 0  Down, Depressed, Hopeless 0 0 0 0 0  PHQ - 2 Score 0 0 0 0 0  Altered sleeping 0 0 - 0 1  Tired, decreased energy 0 0 - 2 2  Change in appetite 0 0 - 0 0  Feeling bad or failure about yourself  0 0 - 0 0  Trouble concentrating 0 0 - 1 0  Moving slowly or fidgety/restless 0 0 - 0 0  Suicidal thoughts 0 0 - 0 0  PHQ-9 Score 0 0 - 3 3  Difficult doing work/chores - - - Somewhat difficult -  Some recent data might be hidden    phq 9 is negative   Fall Risk: Fall Risk  08/14/2019  06/20/2019 04/14/2019 12/08/2018  09/09/2018  Falls in the past year? 0 0 0 0 0  Number falls in past yr: 0 0 0 0 -  Injury with Fall? 0 0 0 0 -  Risk for fall due to : - - - - -  Risk for fall due to: Comment - - - - -     Functional Status Survey: Is the patient deaf or have difficulty hearing?: No Does the patient have difficulty seeing, even when wearing glasses/contacts?: No Does the patient have difficulty concentrating, remembering, or making decisions?: No Does the patient have difficulty walking or climbing stairs?: Yes Does the patient have difficulty dressing or bathing?: No Does the patient have difficulty doing errands alone such as visiting a doctor's office or shopping?: No    Assessment & Plan  1. Controlled type 2 diabetes mellitus with stage 3 chronic kidney disease, without long-term current use of insulin (HCC)  - POCT HgB A1C - Microalbumin / creatinine urine ratio - COMPLETE METABOLIC PANEL WITH GFR  2. Morbid obesity due to excess calories [facility])  Discussed with the patient the risk posed by an increased BMI. Discussed importance of portion control, calorie counting and at least 150 minutes of physical activity weekly. Avoid sweet beverages and drink more water. Eat at least 6 servings of fruit and vegetables daily   3. Stage 3a chronic kidney disease  - CBC with Differential/Platelet - VITAMIN D 25 Hydroxy (Vit-D Deficiency, Fractures)  4. Obstructive apnea  Very compliant   5. Obesity, diabetes, and hypertension syndrome (HCC)  Stable   6. Osteoarthritis, chronic   7. Gait instability   8. Asthma, mild intermittent, well-controlled   9. Benign essential HTN  At goal   10. Controlled gout  No symptoms   11. Dyslipidemia  - Lipid panel  12. B12 deficiency  - Vitamin B12  13. History of gastritis   14. History of iron deficiency anemia  - CBC with Differential/Platelet - Iron, TIBC and Ferritin Panel  15. Lumbar back pain with radiculopathy affecting  left lower extremity  - gabapentin (NEURONTIN) 300 MG capsule; TAKE 1 CAPSULE BY MOUTH EVERY MORNING, 1 CAPSULE EVERY EVENING AND 2 CAPSULE AT NIGHT  Dispense: 120 capsule; Refill: 1

## 2019-12-15 LAB — CBC WITH DIFFERENTIAL/PLATELET
Absolute Monocytes: 771 {cells}/uL (ref 200–950)
Basophils Absolute: 40 {cells}/uL (ref 0–200)
Basophils Relative: 0.6 %
Eosinophils Absolute: 322 {cells}/uL (ref 15–500)
Eosinophils Relative: 4.8 %
HCT: 36.7 % (ref 35.0–45.0)
Hemoglobin: 12.3 g/dL (ref 11.7–15.5)
Lymphs Abs: 1762 {cells}/uL (ref 850–3900)
MCH: 32.4 pg (ref 27.0–33.0)
MCHC: 33.5 g/dL (ref 32.0–36.0)
MCV: 96.6 fL (ref 80.0–100.0)
MPV: 11 fL (ref 7.5–12.5)
Monocytes Relative: 11.5 %
Neutro Abs: 3806 {cells}/uL (ref 1500–7800)
Neutrophils Relative %: 56.8 %
Platelets: 177 10*3/uL (ref 140–400)
RBC: 3.8 Million/uL (ref 3.80–5.10)
RDW: 13.5 % (ref 11.0–15.0)
Total Lymphocyte: 26.3 %
WBC: 6.7 10*3/uL (ref 3.8–10.8)

## 2019-12-15 LAB — VITAMIN D 25 HYDROXY (VIT D DEFICIENCY, FRACTURES): Vit D, 25-Hydroxy: 26 ng/mL — ABNORMAL LOW (ref 30–100)

## 2019-12-15 LAB — COMPLETE METABOLIC PANEL WITHOUT GFR
AG Ratio: 2 (calc) (ref 1.0–2.5)
ALT: 22 U/L (ref 6–29)
AST: 22 U/L (ref 10–35)
Albumin: 4.4 g/dL (ref 3.6–5.1)
Alkaline phosphatase (APISO): 89 U/L (ref 37–153)
BUN/Creatinine Ratio: 37 (calc) — ABNORMAL HIGH (ref 6–22)
BUN: 43 mg/dL — ABNORMAL HIGH (ref 7–25)
CO2: 24 mmol/L (ref 20–32)
Calcium: 9.3 mg/dL (ref 8.6–10.4)
Chloride: 106 mmol/L (ref 98–110)
Creat: 1.16 mg/dL — ABNORMAL HIGH (ref 0.60–0.93)
GFR, Est African American: 54 mL/min/{1.73_m2} — ABNORMAL LOW
GFR, Est Non African American: 47 mL/min/{1.73_m2} — ABNORMAL LOW
Globulin: 2.2 g/dL (ref 1.9–3.7)
Glucose, Bld: 119 mg/dL — ABNORMAL HIGH (ref 65–99)
Potassium: 4.8 mmol/L (ref 3.5–5.3)
Sodium: 140 mmol/L (ref 135–146)
Total Bilirubin: 0.7 mg/dL (ref 0.2–1.2)
Total Protein: 6.6 g/dL (ref 6.1–8.1)

## 2019-12-15 LAB — IRON,TIBC AND FERRITIN PANEL
%SAT: 23 % (ref 16–45)
Ferritin: 67 ng/mL (ref 16–288)
Iron: 90 ug/dL (ref 45–160)
TIBC: 397 ug/dL (ref 250–450)

## 2019-12-15 LAB — MICROALBUMIN / CREATININE URINE RATIO
Creatinine, Urine: 23 mg/dL (ref 20–275)
Microalb Creat Ratio: 13 ug/mg{creat}
Microalb, Ur: 0.3 mg/dL

## 2019-12-15 LAB — LIPID PANEL
Cholesterol: 141 mg/dL
HDL: 52 mg/dL
LDL Cholesterol (Calc): 67 mg/dL
Non-HDL Cholesterol (Calc): 89 mg/dL
Total CHOL/HDL Ratio: 2.7 (calc)
Triglycerides: 141 mg/dL

## 2019-12-15 LAB — VITAMIN B12: Vitamin B-12: 347 pg/mL (ref 200–1100)

## 2019-12-17 DIAGNOSIS — J449 Chronic obstructive pulmonary disease, unspecified: Secondary | ICD-10-CM | POA: Diagnosis not present

## 2019-12-19 ENCOUNTER — Other Ambulatory Visit: Payer: Self-pay | Admitting: Family Medicine

## 2019-12-19 DIAGNOSIS — I10 Essential (primary) hypertension: Secondary | ICD-10-CM

## 2019-12-19 MED ORDER — POTASSIUM CHLORIDE CRYS ER 20 MEQ PO TBCR: 20.0000 meq | EXTENDED_RELEASE_TABLET | Freq: Every day | ORAL | 0 refills | Status: DC

## 2019-12-29 NOTE — Chronic Care Management (AMB) (Signed)
  Chronic Care Management   Note  12/29/2019- late entry Name: Amber Avery MRN: [id number] DOB: [birth year]  Medication Assistance: Patient is has been approved for Trulicity made by Ralph Leyden and prescribed by Steele Sizer. Approval expires on 10/18/20  Goals Addressed            This Visit's Progress   . COMPLETED: medication assistnace 2021 (pt-stated)       Current Barriers:  . financial  Pharmacist Clinical Goal(s): Over the next 14 days, [name] will provide the necessary supplementary documents (proof of out of pocket prescription expenditure, proof of household income) needed for medication assistance applications to CCM pharmacist.   Interventions: . CCM pharmacist will apply for medication assistance program for Trulicity made by [employer] and prescribed by Dr. Ancil Boozer.  [name] Kitchen Updated 2/1: patient provided patient application for Trulicity assistance . Updated 2/2: submitted application to [employer] and uploaded to media tab  Patient Self Care Activities:  [name] Kitchen Gather necessary documents needed to apply for medication assistance  Please see past updates related to this goal by clicking on the "Past Updates" button in the selected goal          Follow up plan: The patient has been provided with contact information for the care management team and has been advised to call with any health related questions or concerns.   Ruben Reason, PharmD Clinical Pharmacist [facility] [phone]

## 2019-12-29 NOTE — Progress Notes (Signed)
This encounter was created in error - please disregard.

## 2019-12-29 NOTE — Patient Instructions (Signed)
Congratulations! You have met all case management goals! You may call the case management team at any time should you have a question or if you have new case management needs. We are happy to help you! We will let your doctor know that you have met your goals.    Thank you allowing the Chronic Care Management Team to be a part of your care!   Please call a member of the CCM (Chronic Care Management) Team with any questions or case management needs in the future:   Felecia McCray, RN, BSN Nurse Care Coordinator  [phone]  Fuller Makin, PharmD  Clinical Pharmacist  [phone]  Chrystal Land, LCSW Social Woker [phone]   

## 2020-01-15 DIAGNOSIS — J449 Chronic obstructive pulmonary disease, unspecified: Secondary | ICD-10-CM | POA: Diagnosis not present

## 2020-01-25 ENCOUNTER — Telehealth: Payer: Self-pay | Admitting: Family Medicine

## 2020-01-25 NOTE — Telephone Encounter (Signed)
Patient requesting a referral to [facility] sports medicine clinic for ongoing knee pain in both knees, please advise when referral is placed

## 2020-01-29 ENCOUNTER — Other Ambulatory Visit: Payer: Self-pay | Admitting: Family Medicine

## 2020-01-29 DIAGNOSIS — M1990 Unspecified osteoarthritis, unspecified site: Secondary | ICD-10-CM

## 2020-01-29 DIAGNOSIS — M170 Bilateral primary osteoarthritis of knee: Secondary | ICD-10-CM

## 2020-02-06 DIAGNOSIS — M170 Bilateral primary osteoarthritis of knee: Secondary | ICD-10-CM | POA: Diagnosis not present

## 2020-02-06 DIAGNOSIS — M25562 Pain in left knee: Secondary | ICD-10-CM | POA: Diagnosis not present

## 2020-02-06 DIAGNOSIS — Z6841 Body Mass Index (BMI) 40.0 and over, adult: Secondary | ICD-10-CM | POA: Insufficient documentation

## 2020-02-06 DIAGNOSIS — E119 Type 2 diabetes mellitus without complications: Secondary | ICD-10-CM | POA: Diagnosis not present

## 2020-02-06 DIAGNOSIS — G8929 Other chronic pain: Secondary | ICD-10-CM | POA: Diagnosis not present

## 2020-02-06 DIAGNOSIS — M25561 Pain in right knee: Secondary | ICD-10-CM | POA: Diagnosis not present

## 2020-02-15 DIAGNOSIS — J449 Chronic obstructive pulmonary disease, unspecified: Secondary | ICD-10-CM | POA: Diagnosis not present

## 2020-03-12 ENCOUNTER — Other Ambulatory Visit: Payer: Self-pay | Admitting: Family Medicine

## 2020-03-12 DIAGNOSIS — M5416 Radiculopathy, lumbar region: Secondary | ICD-10-CM

## 2020-03-12 DIAGNOSIS — I10 Essential (primary) hypertension: Secondary | ICD-10-CM

## 2020-03-12 NOTE — Telephone Encounter (Signed)
Requested Prescriptions  Pending Prescriptions Disp Refills  . gabapentin (NEURONTIN) 300 MG capsule [Pharmacy Med Name: Gabapentin 300 MG Oral Capsule] 360 capsule 0    Sig: TAKE 1 CAPSULE BY MOUTH IN THE MORNING AND 1 IN THE EVENING AND 2 AT NIGHT     Neurology: Anticonvulsants - gabapentin Passed - 03/12/2020  7:13 PM      Passed - Valid encounter within last 12 months    Recent Outpatient Visits          2 months ago Controlled type 2 diabetes mellitus with stage 3 chronic kidney disease, without long-term current use of insulin ([place])   [facility] [place], Drue Stager, MD   7 months ago Controlled type 2 diabetes mellitus with stage 3 chronic kidney disease, without long-term current use of insulin [facility])   [facility] [place], Drue Stager, MD   11 months ago Controlled type 2 diabetes mellitus with stage 3 chronic kidney disease, without long-term current use of insulin [facility])   [facility] [place], Drue Stager, MD   1 year ago Controlled type 2 diabetes mellitus with stage 2 chronic kidney disease, unspecified whether long term insulin use [facility])   [facility] [place], Drue Stager, MD   1 year ago Controlled type 2 diabetes mellitus with stage 2 chronic kidney disease, unspecified whether long term insulin use [facility])   [facility] Steele Sizer, MD      Future Appointments            In 1 month Ancil Boozer, Drue Stager, MD [facility] [place]   In 3 months  [place]

## 2020-03-13 MED ORDER — POTASSIUM CHLORIDE CRYS ER 20 MEQ PO TBCR: 20.0000 meq | EXTENDED_RELEASE_TABLET | Freq: Every day | ORAL | 0 refills | Status: DC

## 2020-03-16 DIAGNOSIS — J449 Chronic obstructive pulmonary disease, unspecified: Secondary | ICD-10-CM | POA: Diagnosis not present

## 2020-04-02 ENCOUNTER — Telehealth: Payer: Self-pay | Admitting: Family Medicine

## 2020-04-02 NOTE — Chronic Care Management (AMB) (Signed)
  Chronic Care Management   Note  04/02/2020 Name: [name] MRN: [id number] DOB: 1945-10-25  Amber Avery is a 74 y.o. year old female who is a primary care patient of Steele Sizer, MD. I reached out to Amber Avery by phone today in response to a referral sent by Ms. Tsosie Billing [place]'s health plan.     Ms. Baxley was given information about Chronic Care Management services today including:  1. CCM service includes personalized support from designated clinical staff supervised by her physician, including individualized plan of care and coordination with other care providers 2. 24/7 contact phone numbers for assistance for urgent and routine care needs. 3. Service will only be billed when office clinical staff spend 20 minutes or more in a month to coordinate care. 4. Only one practitioner may furnish and bill the service in a calendar month. 5. The patient may stop CCM services at any time (effective at the end of the month) by phone call to the office staff. 6. The patient will be responsible for cost sharing (co-pay) of up to 20% of the service fee (after annual deductible is met).  Patient agreed to services and verbal consent obtained.   Follow up plan: Telephone appointment with care management team member scheduled for:04/30/2020.  [place], [place] [address] Direct Dial: [phone] [email or web address]: [email or web address]

## 2020-04-12 ENCOUNTER — Other Ambulatory Visit: Payer: Self-pay

## 2020-04-12 ENCOUNTER — Encounter: Payer: Self-pay | Admitting: Family Medicine

## 2020-04-12 ENCOUNTER — Ambulatory Visit (INDEPENDENT_AMBULATORY_CARE_PROVIDER_SITE_OTHER): Payer: Medicare HMO | Admitting: Family Medicine

## 2020-04-12 VITALS — BP 126/64 | HR 77 | Temp 96.9°F | Resp 16 | Ht 62.0 in | Wt 272.9 lb

## 2020-04-12 DIAGNOSIS — D692 Other nonthrombocytopenic purpura: Secondary | ICD-10-CM

## 2020-04-12 DIAGNOSIS — E1122 Type 2 diabetes mellitus with diabetic chronic kidney disease: Secondary | ICD-10-CM

## 2020-04-12 DIAGNOSIS — J4520 Mild intermittent asthma, uncomplicated: Secondary | ICD-10-CM

## 2020-04-12 DIAGNOSIS — M1990 Unspecified osteoarthritis, unspecified site: Secondary | ICD-10-CM | POA: Diagnosis not present

## 2020-04-12 DIAGNOSIS — M109 Gout, unspecified: Secondary | ICD-10-CM | POA: Diagnosis not present

## 2020-04-12 DIAGNOSIS — G4733 Obstructive sleep apnea (adult) (pediatric): Secondary | ICD-10-CM | POA: Diagnosis not present

## 2020-04-12 DIAGNOSIS — M170 Bilateral primary osteoarthritis of knee: Secondary | ICD-10-CM

## 2020-04-12 DIAGNOSIS — L03115 Cellulitis of right lower limb: Secondary | ICD-10-CM | POA: Diagnosis not present

## 2020-04-12 DIAGNOSIS — I10 Essential (primary) hypertension: Secondary | ICD-10-CM | POA: Diagnosis not present

## 2020-04-12 DIAGNOSIS — E538 Deficiency of other specified B group vitamins: Secondary | ICD-10-CM

## 2020-04-12 DIAGNOSIS — N1831 Chronic kidney disease, stage 3a: Secondary | ICD-10-CM

## 2020-04-12 DIAGNOSIS — E785 Hyperlipidemia, unspecified: Secondary | ICD-10-CM | POA: Diagnosis not present

## 2020-04-12 DIAGNOSIS — N183 Chronic kidney disease, stage 3 unspecified: Secondary | ICD-10-CM

## 2020-04-12 LAB — POCT GLYCOSYLATED HEMOGLOBIN (HGB A1C): Hemoglobin A1C: 7 % — AB (ref 4.0–5.6)

## 2020-04-12 MED ORDER — ATORVASTATIN CALCIUM 40 MG PO TABS: ORAL_TABLET | ORAL | 3 refills | Status: DC

## 2020-04-12 MED ORDER — DOXYCYCLINE HYCLATE 100 MG PO TABS: 100.0000 mg | ORAL_TABLET | Freq: Two times a day (BID) | ORAL | 0 refills | Status: DC

## 2020-04-12 MED ORDER — DICLOFENAC SODIUM 1 % EX GEL: 4.0000 g | Freq: Four times a day (QID) | CUTANEOUS | 5 refills | Status: DC

## 2020-04-12 MED ORDER — ALLOPURINOL 300 MG PO TABS: 300.0000 mg | ORAL_TABLET | Freq: Every day | ORAL | 3 refills | Status: DC

## 2020-04-12 NOTE — Progress Notes (Addendum)
Name: Amber Avery   MRN: [id number]    DOB: 13-May-1946   Date:04/12/2020       Progress Note  Subjective  Chief Complaint  Chief Complaint  Patient presents with  . Diabetes  . Dyslipidemia  . Hypertension  . Osteoarthritis  . Ankle Pain    She has pain, swelling, and redness in her right ankle x 3 weeks. Painful to touch, denies injury. Feels a little like gout but she is unsure.    HPI  Dyslipidemia: taking statin therapy, no side effects, no chest painor myalgia, reviewed last labs   AsthmaMild intermittent: she states she is doing much better since she started showering daily, she has a cough daily, but only during her shower, no wheezing or SOB.She uses albuterol very seldom.Stable   DM with renal manifestation: HgbA1C is high at 7 %, she states she was not eating healthy around the time her sister diet - April 2021, but has resumed a healthier die. She is on Trulicity, tolerating medication welll, no polyphagia, polyuria or polydipsia . Continue medication. She has dyslipidemia. She has some burning sensation on her legs during the night, on gabapentin   Obesity: weight is stable, still trying to be active, she likes to bake but using sugar substitute   OSA: she wear machine every night, she uses oxygen with CPAP.Compliant   Right thyroid nodule; [facility] by [facility] at [facility], biopsy done and negative.Unchanged   Gait instability: she uses a cane,they moved mailbox near her house and is doing well. Still has pain on both knees from OA.She has a walk-in shower and a ramp now and it has helped her to be more independent, unchanged   OA : shesaw Ortho, Dr. Claretha Cooper he advised her not to have surgery because of her weight, she had one round of hyaluronan injection without help and it was too expensive. She is still taking Tylenol She still has intermittent right knee effusion when more active, no redness or increase in warmth. She has a hoveround, uses walker and  sometimes cane.She still wears her Fibit , trying to be around 4 k steps per day  Iron deficiency anemia:last level was better, under the care of Dr. Tasia Catchings and had 5 iron infusions,  normal HCT 11/2019   Trigger Thumb: right side, discussed referral to Ortho, she will think about it She also has ganglion cyst   Senile purpura: bruises easily on both arms, this time on left, explained it is common after age 40, gave reassurance  Patient Active Problem List   Diagnosis Date Noted  . BMI 50.0-59.9, adult ([place]) 02/06/2020  . History of gastritis 08/14/2019  . Angiodysplasia of stomach and duodenum   . Iron deficiency anemia 04/20/2018  . Anemia, unspecified 04/12/2017  . Lichen sclerosus of female genitalia 06/29/2016  . Primary osteoarthritis of both knees 04/07/2016  . Special screening for malignant neoplasms, colon   . Right thyroid nodule 06/26/2015  . Asthma, intermittent 04/03/2015  . Benign essential HTN 04/03/2015  . Carpal tunnel syndrome 04/03/2015  . Chronic kidney disease (CKD), stage II (mild) 04/03/2015  . Controlled gout 04/03/2015  . Arteriosclerosis of coronary artery 04/03/2015  . Diabetes mellitus with renal manifestations, controlled ([place]) 04/03/2015  . Dyslipidemia 04/03/2015  . Edema extremities 04/03/2015  . Elevated sedimentation rate 04/03/2015  . Knee pain 04/03/2015  . Lumbar radiculitis 04/03/2015  . Obstructive apnea 04/03/2015  . Lumbosacral spondylosis 04/03/2015  . Osteoarthritis, chronic 04/03/2015  . Psoriasis 04/03/2015  . Vitamin  D deficiency 04/03/2015  . Varicose veins 04/03/2015  . Goiter 12/23/2014  . Degeneration of intervertebral disc of lumbar region 08/23/2014  . Corns and callosity 06/11/2009    Past Surgical History:  Procedure Laterality Date  . APPENDECTOMY    . CHOLECYSTECTOMY  1977  . COLONOSCOPY WITH PROPOFOL N/A 01/21/2016   Procedure: COLONOSCOPY WITH PROPOFOL;  Surgeon: Lucilla Lame, MD;  Location: [facility] ENDOSCOPY;   Service: Endoscopy;  Laterality: N/A;  . DILATION AND CURETTAGE OF UTERUS     Due to Amenorrhea  . ESOPHAGOGASTRODUODENOSCOPY (EGD) WITH PROPOFOL N/A 08/16/2018   Procedure: ESOPHAGOGASTRODUODENOSCOPY (EGD) WITH PROPOFOL;  Surgeon: Lucilla Lame, MD;  Location: [facility] ENDOSCOPY;  Service: Endoscopy;  Laterality: N/A;  . [place]  . HEMORROIDECTOMY    . HERNIA REPAIR  2011  . Temporal Area Excision Biopsy     For Birth Mark Changes, Negative Pathology  . TONSILLECTOMY AND ADENOIDECTOMY      Family History  Problem Relation Age of Onset  . Cancer Mother        brain tumor  . Kidney disease Mother   . Hypertension Mother   . Heart disease Father   . COPD Father   . Hypertension Sister   . Arthritis Sister   . Cancer Sister   . Heart murmur Sister   . GER disease Sister   . Lupus Sister   . Diabetes Sister   . Arthritis Sister   . Osteopenia Sister   . Heart disease Sister   . Hypertension Sister   . Breast cancer Maternal Aunt 70  . Leukemia Maternal Aunt     Social History   Tobacco Use  . Smoking status: Former Smoker    Packs/day: 2.00    Years: 15.00    Pack years: 30.00    Types: Cigarettes    Quit date: 1983    Years since quitting: 38.5  . Smokeless tobacco: Never Used  . Tobacco comment: smoking cessation materials not required  Substance Use Topics  . Alcohol use: Not Currently    Alcohol/week: 0.0 standard drinks     Current Outpatient Medications:  .  acetaminophen (TYLENOL) 500 MG tablet, Take 1 tablet (500 mg total) by mouth every 6 (six) hours as needed. (Patient taking differently: Take 1,000 mg by mouth every 8 (eight) hours. ), Disp: 480 tablet, Rfl: 0 .  albuterol (ACCUNEB) 1.25 MG/3ML nebulizer solution, Take 3 mLs (1.25 mg total) by nebulization 2 (two) times daily., Disp: 75 mL, Rfl: 2 .  allopurinol (ZYLOPRIM) 300 MG tablet, Take 1 tablet (300 mg total) by mouth daily., Disp: 90 tablet, Rfl: 3 .  atorvastatin (LIPITOR) 40 MG  tablet, Daily for cholesterol, Disp: 90 tablet, Rfl: 3 .  benazepril (LOTENSIN) 20 MG tablet, Take 1 tablet (20 mg total) by mouth daily., Disp: 90 tablet, Rfl: 3 .  Blood Glucose Monitoring Suppl (TRUE METRIX AIR GLUCOSE METER) w/Device KIT, 1 each by Does not apply route 1 day or 1 dose. Checks Fasting Blood Sugar Twice daily Diabetes mellitus with renal manifestations, controlled ([place])      Code: E11.29, Disp: 1 kit, Rfl: 0 .  budesonide (PULMICORT) 0.25 MG/2ML nebulizer solution, Take 2 mLs (0.25 mg total) by nebulization 2 (two) times daily., Disp: 60 mL, Rfl: 2 .  dexamethasone (DECADRON) 0.1 % ophthalmic solution, INSTILL 1 DROP LEY D PRN, Disp: , Rfl:  .  diclofenac sodium (VOLTAREN) 1 % GEL, APPLY 4 GRAMS TOPICALLY TO BOTH KNEES 4 TIMES DAILY  FOR OSTEOARTHRITIS, Disp: 100 g, Rfl: 2 .  Dulaglutide (TRULICITY) 1.5 BD/5.3GD SOPN, Inject 1.5 mg into the skin once a week., Disp: 12 pen, Rfl: 3 .  furosemide (LASIX) 40 MG tablet, Take 1 tablet (40 mg total) by mouth daily., Disp: 90 tablet, Rfl: 3 .  gabapentin (NEURONTIN) 300 MG capsule, TAKE 1 CAPSULE BY MOUTH IN THE MORNING AND 1 IN THE EVENING AND 2 AT NIGHT, Disp: 360 capsule, Rfl: 3 .  glucose blood test strip, Use as instructed, Disp: 100 each, Rfl: 12 .  Insulin Pen Needle 32G X 6 MM MISC, 1 each by Does not apply route 4 (four) times daily., Disp: 200 each, Rfl: 2 .  Lancets (ONETOUCH ULTRASOFT) lancets, Use as instructed, Disp: 100 each, Rfl: 12 .  potassium chloride SA (KLOR-CON) 20 MEQ tablet, Take 1 tablet (20 mEq total) by mouth daily., Disp: 90 tablet, Rfl: 0 .  triamcinolone ointment (KENALOG) 0.1 %, Use BID prn, Disp: 90 g, Rfl: 1 .  Vitamin D, Cholecalciferol, 25 MCG (1000 UT) TABS, Take 1 capsule by mouth daily., Disp: 90 tablet, Rfl: 1  Allergies  Allergen Reactions  . Codeine     I personally reviewed active problem list, medication list, allergies, family history, social history, health maintenance with the  patient/caregiver today.   ROS  Constitutional: Negative for fever or weight change.  Respiratory: Negative for cough and shortness of breath.   Cardiovascular: Negative for chest pain or palpitations.  Gastrointestinal: Negative for abdominal pain, no bowel changes.  Musculoskeletal:Positive for gait problem and right joint swelling.  Skin: positive for redness right medial ankle Neurological: Negative for dizziness or headache.  No other specific complaints in a complete review of systems (except as listed in HPI above).  Objective  Vitals:   04/12/20 1047  BP: 126/64  Pulse: 77  Resp: 16  Temp: (!) 96.9 F (36.1 C)  TempSrc: Temporal  SpO2: 94%  Weight: 272 lb 14.4 oz (123.8 kg)  Height: _0  (1.575 m)    Body mass index is 49.91 kg/m.  Physical Exam  Constitutional: Patient appears well-developed and well-nourished. Obese  No distress.  HEENT: head atraumatic, normocephalic, pupils equal and reactive to light, neck supple, throat within normal limits Cardiovascular: Normal rate, regular rhythm and normal heart sounds.  No murmur heard. No BLE edema. Pulmonary/Chest: Effort normal and breath sounds normal. No respiratory distress. Abdominal: Soft.  There is no tenderness. Muscular Skeletal: crepitus with extension of right knee, swelling right ankle, erythema and increase in warmth on right middle ankle. Skin: senile purpura left arm  Psychiatric: Patient has a normal mood and affect. behavior is normal. Judgment and thought content normal.  Recent Results (from the past 2160 hour(s))  POCT HgB A1C     Status: Abnormal   Collection Time: 04/12/20 10:54 AM  Result Value Ref Range   Hemoglobin A1C 7.0 (A) 4.0 - 5.6 %   HbA1c POC (<> result, manual entry)     HbA1c, POC (prediabetic range)     HbA1c, POC (controlled diabetic range)       PHQ2/9: Depression screen [facility] 2/9 04/12/2020 04/12/2020 12/14/2019 08/14/2019 06/20/2019  Decreased Interest 0 0 0 0 0  Down,  Depressed, Hopeless 0 0 0 0 0  PHQ - 2 Score 0 0 0 0 0  Altered sleeping 0 0 0 0 -  Tired, decreased energy 0 0 0 0 -  Change in appetite 0 0 0 0 -  Feeling bad or failure  about yourself  0 0 0 0 -  Trouble concentrating 0 0 0 0 -  Moving slowly or fidgety/restless 0 0 0 0 -  Suicidal thoughts 0 0 0 0 -  PHQ-9 Score 0 0 0 0 -  Difficult doing work/chores - - - - -  Some recent data might be hidden    phq 9 is negative   Fall Risk: Fall Risk  04/12/2020 08/14/2019 06/20/2019 04/14/2019 12/08/2018  Falls in the past year? 0 0 0 0 0  Number falls in past yr: 0 0 0 0 0  Injury with Fall? 0 0 0 0 0  Risk for fall due to : - - - - -  Risk for fall due to: Comment - - - - -    Functional Status Survey: Is the patient deaf or have difficulty hearing?: No Does the patient have difficulty seeing, even when wearing glasses/contacts?: Yes Does the patient have difficulty concentrating, remembering, or making decisions?: No Does the patient have difficulty walking or climbing stairs?: Yes Does the patient have difficulty dressing or bathing?: No Does the patient have difficulty doing errands alone such as visiting a doctor's office or shopping?: Yes    Assessment & Plan  1. Controlled type 2 diabetes mellitus with stage 3 chronic kidney disease, without long-term current use of insulin (HCC)  - POCT HgB A1C  2. Benign essential HTN  At goal   3. Bilateral primary osteoarthritis of knee  Needs voltaren gel   4. Morbid obesity with body mass index (BMI) of 40.0 or higher (HCC)  Discussed with the patient the risk posed by an increased BMI. Discussed importance of portion control, calorie counting and at least 150 minutes of physical activity weekly. Avoid sweet beverages and drink more water. Eat at least 6 servings of fruit and vegetables daily   5. B12 deficiency  Continue supplementation   6. Asthma, mild intermittent, well-controlled  Doing well at this time  7.  Obstructive apnea   8. Stage 3a chronic kidney disease  Reviewed last labs  9. Osteoarthritis, chronic  - diclofenac Sodium (VOLTAREN) 1 % GEL; Apply 4 g topically 4 (four) times daily.  Dispense: 100 g; Refill: 5  10. Dyslipidemia  - atorvastatin (LIPITOR) 40 MG tablet; Daily for cholesterol  Dispense: 90 tablet; Refill: 3  11. Cellulitis of right ankle  - doxycycline (VIBRA-TABS) 100 MG tablet; Take 1 tablet (100 mg total) by mouth 2 (two) times daily.  Dispense: 20 tablet; Refill: 0  12. Controlled gout  - allopurinol (ZYLOPRIM) 300 MG tablet; Take 1 tablet (300 mg total) by mouth daily.  Dispense: 90 tablet; Refill: 3   13. Senile purpura ([place])  Reassurance

## 2020-04-16 DIAGNOSIS — J449 Chronic obstructive pulmonary disease, unspecified: Secondary | ICD-10-CM | POA: Diagnosis not present

## 2020-04-22 DIAGNOSIS — E089 Diabetes mellitus due to underlying condition without complications: Secondary | ICD-10-CM | POA: Diagnosis not present

## 2020-04-22 DIAGNOSIS — H2513 Age-related nuclear cataract, bilateral: Secondary | ICD-10-CM | POA: Diagnosis not present

## 2020-04-22 DIAGNOSIS — E119 Type 2 diabetes mellitus without complications: Secondary | ICD-10-CM | POA: Diagnosis not present

## 2020-04-22 DIAGNOSIS — H524 Presbyopia: Secondary | ICD-10-CM | POA: Diagnosis not present

## 2020-04-22 DIAGNOSIS — H04123 Dry eye syndrome of bilateral lacrimal glands: Secondary | ICD-10-CM | POA: Diagnosis not present

## 2020-04-30 ENCOUNTER — Other Ambulatory Visit: Payer: Self-pay

## 2020-04-30 ENCOUNTER — Ambulatory Visit (INDEPENDENT_AMBULATORY_CARE_PROVIDER_SITE_OTHER): Payer: Medicare HMO | Admitting: Pharmacist

## 2020-04-30 DIAGNOSIS — N183 Chronic kidney disease, stage 3 unspecified: Secondary | ICD-10-CM

## 2020-04-30 DIAGNOSIS — E1122 Type 2 diabetes mellitus with diabetic chronic kidney disease: Secondary | ICD-10-CM

## 2020-04-30 DIAGNOSIS — E785 Hyperlipidemia, unspecified: Secondary | ICD-10-CM | POA: Diagnosis not present

## 2020-05-01 NOTE — Chronic Care Management (AMB) (Deleted)
Chronic Care Management Pharmacy  Name: Amber Avery  MRN: [id number] DOB: 1946-01-24  Chief Complaint/ HPI  Amber Avery,  74 y.o. , female presents for their Initial CCM visit with the clinical pharmacist via telephone due to COVID-19 Pandemic.  PCP : Steele Sizer, MD  Their chronic conditions include: DM, HTN, HLD, Chronic Pain  Office Visits: 6/25 DM, [place], BP 126/64 P 77 Wt 273 BMI 49.9, compliant with CPAP, OA knee pain  Consult Visit: NA  Medications: Outpatient Encounter Medications as of 04/30/2020  Medication Sig Note  . allopurinol (ZYLOPRIM) 300 MG tablet Take 1 tablet (300 mg total) by mouth daily.   [name] Kitchen atorvastatin (LIPITOR) 40 MG tablet Daily for cholesterol   . benazepril (LOTENSIN) 20 MG tablet Take 1 tablet (20 mg total) by mouth daily.   . Blood Glucose Monitoring Suppl (TRUE METRIX AIR GLUCOSE METER) w/Device KIT 1 each by Does not apply route 1 day or 1 dose. Checks Fasting Blood Sugar Twice daily Diabetes mellitus with renal manifestations, controlled ([place])      Code: E11.29   . diclofenac Sodium (VOLTAREN) 1 % GEL Apply 4 g topically 4 (four) times daily.   . Dulaglutide (TRULICITY) 1.5 UU/8.2CM SOPN Inject 1.5 mg into the skin once a week.   . furosemide (LASIX) 40 MG tablet Take 1 tablet (40 mg total) by mouth daily.   [name] Kitchen gabapentin (NEURONTIN) 300 MG capsule TAKE 1 CAPSULE BY MOUTH IN THE MORNING AND 1 IN THE EVENING AND 2 AT NIGHT   . glucose blood test strip Use as instructed   . Insulin Pen Needle 32G X 6 MM MISC 1 each by Does not apply route 4 (four) times daily. 10/20/2018: Trouble with copay  . Lancets (ONETOUCH ULTRASOFT) lancets Use as instructed   . potassium chloride SA (KLOR-CON) 20 MEQ tablet Take 1 tablet (20 mEq total) by mouth daily.   [name] Kitchen triamcinolone ointment (KENALOG) 0.1 % Use BID prn   . Vitamin D, Cholecalciferol, 25 MCG (1000 UT) TABS Take 1 capsule by mouth daily.   [name] Kitchen acetaminophen (TYLENOL) 500 MG tablet Take 1 tablet (500  mg total) by mouth every 6 (six) hours as needed. (Patient taking differently: Take 1,000 mg by mouth every 8 (eight) hours. )   . albuterol (ACCUNEB) 1.25 MG/3ML nebulizer solution Take 3 mLs (1.25 mg total) by nebulization 2 (two) times daily. (Patient not taking: Reported on 04/30/2020) 10/20/2018: Patient has not needed to use nebulizer  . budesonide (PULMICORT) 0.25 MG/2ML nebulizer solution Take 2 mLs (0.25 mg total) by nebulization 2 (two) times daily. (Patient not taking: Reported on 04/30/2020) 10/20/2018: Patient has not needed   . dexamethasone (DECADRON) 0.1 % ophthalmic solution INSTILL 1 DROP LEY D PRN (Patient not taking: Reported on 04/30/2020)   . doxycycline (VIBRA-TABS) 100 MG tablet Take 1 tablet (100 mg total) by mouth 2 (two) times daily. (Patient not taking: Reported on 04/30/2020)    No facility-administered encounter medications on file as of 04/30/2020.      Financial Resource Strain: High Risk  . Difficulty of Paying Living Expenses: Hard    Current Diagnosis/Assessment:  Goals Addressed            This Visit's Progress   . Chronic Care Management       CARE PLAN ENTRY (see longitudinal plan of care for additional care plan information)  Current Barriers:  . Chronic Disease Management support, education, and care coordination needs related to Hypertension, Hyperlipidemia, and Diabetes  Hyperlipidemia Lab Results  Component Value Date/Time   LDLCALC 67 12/14/2019 10:49 AM   . Pharmacist Clinical Goal(s): o Over the next 90 days, patient will work with PharmD and providers to maintain LDL goal < 70 . Current regimen:  o Lipitor 40 mg daily . Interventions: o None . Patient self care activities - Over the next 90 days, patient will: o Continue to take atorvastatin daily  Diabetes Lab Results  Component Value Date/Time   HGBA1C 7.0 (A) 04/12/2020 10:54 AM   HGBA1C 6.6 (A) 12/14/2019 10:05 AM   HGBA1C 6.0 04/14/2019 10:33 AM   HGBA1C 6.3 12/08/2018 11:22  AM   . Pharmacist Clinical Goal(s): o Over the next 90 days, patient will work with PharmD and providers to achieve A1c goal <7% . Current regimen:  o Trulicity 9.7FS inject weekly . Interventions: o Recommended restarting metformin 523m twice daily with meals . Patient self care activities - Over the next 90 days, patient will: o Check blood sugar once daily, document, and provide at future appointments o Contact provider with any episodes of hypoglycemia  Chronic Pain . Pharmacist Clinical Goal(s) o Over the next 90 days, patient will work with PharmD and providers to decrease pain level . Current regimen:  o Voltaren gel 1 - 3 times daily as needed o Gabapentin 3053mmorning 30035mm sometimes 600m50m bedtime o Acetaminophen 1000mg59mee times daily . Interventions: o Take gabapentin more regularly if possible without daytime sleepiness . Patient self care activities - Over the next 90 days, patient will: o Increase mobility exercises even while sitting  Medication management . Pharmacist Clinical Goal(s): o Over the next 90 days, patient will work with PharmD and providers to maintain optimal medication adherence . Current pharmacy: [facility] . Interventions o Comprehensive medication review performed. o Continue current medication management strategy . Patient self care activities - Over the next 90 days, patient will: o Focus on medication adherence by continuing current practices o Take medications as prescribed o Report any questions or concerns to PharmD and/or provider(s)  Initial goal documentation       Diabetes   Recent Relevant Labs: Lab Results  Component Value Date/Time   HGBA1C 7.0 (A) 04/12/2020 10:54 AM   HGBA1C 6.6 (A) 12/14/2019 10:05 AM   HGBA1C 6.0 04/14/2019 10:33 AM   HGBA1C 6.3 12/08/2018 11:22 AM   MICROALBUR 0.3 12/14/2019 10:49 AM   MICROALBUR 1.2 04/14/2019 11:25 AM   MICROALBUR 20 04/11/2018 10:53 AM   MICROALBUR 20 08/09/2017 09:57  AM     Checking BG: Rarely  Recent FBG Readings: NA Recent pre-meal BG readings: 112 - 159 Patient is currently uncontrolled on the following medications: Trulicity  Last diabetic Foot exam:  Lab Results  Component Value Date/Time   HMDIABEYEEXA No Retinopathy 04/17/2019 12:00 AM    Last diabetic Eye exam: No results found for: HMDIABFOOTEX   We discussed:  Vitamin D still low Has 5 boxes of Trulicity, gets from [employer] sister got cancer, stress eating, sister died in April02-May-2024 meals on wheels which helps with portion control Willing to try metformin again   Plan  Patient to inform PharmD of [employer] free Trulicity Recommend restart metformin 500mg 78mb by mouth twice daily with meals  Hyperlipidemia   LDL goal < 70  Lipid Panel     Component Value Date/Time   CHOL 141 12/14/2019 1049   CHOL 123 07/31/2016 0910   TRIG 141 12/14/2019 1049  HDL 52 12/14/2019 1049   HDL 45 07/31/2016 0910   LDLCALC 67 12/14/2019 1049    Hepatic Function Latest Ref Rng & Units 12/14/2019 12/08/2018 04/11/2018  Total Protein 6.1 - 8.1 g/dL 6.6 6.7 6.9  Albumin 3.6 - 5.1 g/dL - - -  AST 10 - 35 U/L 22 27 39(H)  ALT 6 - 29 U/L 22 26 33(H)  Alk Phosphatase 33 - 130 U/L - - -  Total Bilirubin 0.2 - 1.2 mg/dL 0.7 0.6 0.5     The 10-year ASCVD risk score Mikey Bussing DC Jr., et al., 2013) is: 25.5%   Values used to calculate the score:     Age: 58 years     Sex: Female     Is Non-Hispanic African American: No     Diabetic: Yes     Tobacco smoker: No     Systolic Blood Pressure: 659 mmHg     Is BP treated: Yes     HDL Cholesterol: 52 mg/dL     Total Cholesterol: 141 mg/dL   Patient has failed these meds in past: NA Patient is currently controlled on the following medications:  . Lipitor 35m daily  We discussed:   Denies myalgias if taken during the day  Plan  Continue current medications  Chronic Pain   Patient has failed these meds in  past: NA Patient is currently uncontrolled on the following medications: gabapentin, APAP, colchicine  We discussed:   GoodRx for Voltaren, once daily, sometimes 3 times daily Last gout attack years ago Doxycycline did not resolve calf/ankle pain Takes gabapentin at lunch if hurting ( 2 - 3 pm), causes somnolence Got nerve block injections APAP 6 tabs daily  Plan  Take gabapentin more regularly if possible Continue current medications  Medication Management   [facility] Uses pill box? Yes Pt endorses 90% compliance Zero copay, less than 1 mile  We discussed:  Pharmacy doesn't match [employer](she says [employer] said [facility] preferred) Kenalog about once daily Getting cataract surgery (worried about sugar increasing with steroid eye drops) Uses GoodRx on several medications for discounts  Plan  Continue current medication management strategy  Follow up: 3 month phone visit  TMilus Height PharmD, BSausal [phone]

## 2020-05-01 NOTE — Patient Instructions (Addendum)
Visit Information  Goals Addressed            This Visit's Progress   . Chronic Care Management       CARE PLAN ENTRY (see longitudinal plan of care for additional care plan information)  Current Barriers:  . Chronic Disease Management support, education, and care coordination needs related to Hypertension, Hyperlipidemia, and Diabetes   Hyperlipidemia Lab Results  Component Value Date/Time   LDLCALC 67 12/14/2019 10:49 AM   . Pharmacist Clinical Goal(s): o Over the next 90 days, patient will work with PharmD and providers to maintain LDL goal < 70 . Current regimen:  o Lipitor 40 mg daily . Interventions: o None . Patient self care activities - Over the next 90 days, patient will: o Continue to take atorvastatin daily  Diabetes Lab Results  Component Value Date/Time   HGBA1C 7.0 (A) 04/12/2020 10:54 AM   HGBA1C 6.6 (A) 12/14/2019 10:05 AM   HGBA1C 6.0 04/14/2019 10:33 AM   HGBA1C 6.3 12/08/2018 11:22 AM   . Pharmacist Clinical Goal(s): o Over the next 90 days, patient will work with PharmD and providers to achieve A1c goal <7% . Current regimen:  o Trulicity 5.6FB inject weekly . Interventions: o Recommended restarting metformin 566m twice daily with meals . Patient self care activities - Over the next 90 days, patient will: o Check blood sugar once daily, document, and provide at future appointments o Contact provider with any episodes of hypoglycemia  Chronic Pain . Pharmacist Clinical Goal(s) o Over the next 90 days, patient will work with PharmD and providers to decrease pain level . Current regimen:  o Voltaren gel 1 - 3 times daily as needed o Gabapentin 3072mmorning 30053mm sometimes 600m33m bedtime o Acetaminophen 1000mg73mee times daily . Interventions: o Take gabapentin more regularly if possible without daytime sleepiness . Patient self care activities - Over the next 90 days, patient will: o Increase mobility exercises even while  sitting  Medication management . Pharmacist Clinical Goal(s): o Over the next 90 days, patient will work with PharmD and providers to maintain optimal medication adherence . Current pharmacy: [facility] . Interventions o Comprehensive medication review performed. o Continue current medication management strategy . Patient self care activities - Over the next 90 days, patient will: o Focus on medication adherence by continuing current practices o Take medications as prescribed o Report any questions or concerns to PharmD and/or provider(s)  Initial goal documentation        Amber Avery was given information about Chronic Care Management services today including:  1. CCM service includes personalized support from designated clinical staff supervised by her physician, including individualized plan of care and coordination with other care providers 2. 24/7 contact phone numbers for assistance for urgent and routine care needs. 3. Standard insurance, coinsurance, copays and deductibles apply for chronic care management only during months in which we provide at least 20 minutes of these services. Most insurances cover these services at 100%, however patients may be responsible for any copay, coinsurance and/or deductible if applicable. This service may help you avoid the need for more expensive face-to-face services. 4. Only one practitioner may furnish and bill the service in a calendar month. 5. The patient may stop CCM services at any time (effective at the end of the month) by phone call to the office staff.  Patient agreed to services and verbal consent obtained.   Print copy of patient instructions provided.  Telephone follow up appointment with  pharmacy team member scheduled for: 3 months  Milus Height, PharmD, [place], [facility] [phone]  Chronic Knee Pain, Adult Knee pain that lasts longer than 3 months is called chronic knee pain. You  may have pain in one or both knees. Symptoms of chronic knee pain may also include swelling and stiffness. The most common cause is age-related wear and tear (osteoarthritis) of your knee joint. Many conditions can cause chronic knee pain. Treatment depends on the cause. The main treatments are physical therapy and weight loss. It may also be treated with medicines, injections, a knee sleeve or brace, and by using crutches. Rest, ice, compression (pressure), and elevation (RICE) therapy may also be recommended. Follow these instructions at home: If you have a knee sleeve or brace:   Wear it as told by your doctor. Remove it only as told by your doctor.  Loosen it if your toes: ? Tingle. ? Become numb. ? Turn cold and blue.  Keep it clean.  If the sleeve or brace is not waterproof: ? Do not let it get wet. ? Remove it if told by your doctor, or cover it with a watertight covering when you take a bath or shower. Managing pain, stiffness, and swelling      If told, put heat on your knee. Do this as often as told by your doctor. Use the heat source that your doctor recommends, such as a moist heat pack or a heating pad. ? If you have a removable sleeve or brace, remove it as told by your doctor. ? Place a towel between your skin and the heat source. ? Leave the heat on for 20-30 minutes. ? Remove the heat if your skin turns bright red. This is very important if you are unable to feel pain, heat, or cold. You may have a greater risk of getting burned.  If told, put ice on your knee. ? If you have a removable sleeve or brace, remove it as told by your doctor. ? Put ice in a plastic bag. ? Place a towel between your skin and the bag. ? Leave the ice on for 20 minutes, 2-3 times a day.  Move your toes often.  Raise (elevate) the injured area above the level of your heart while you are sitting or lying down. Activity  Avoid activities where both feet leave the ground at the same time  (high-impact activities). Examples are running, jumping rope, and doing jumping jacks.  Return to your normal activities as told by your doctor. Ask your doctor what activities are safe for you.  Follow the exercise plan that your doctor makes for you. Your doctor may suggest that you: ? Avoid activities that make knee pain worse. You may need to change the exercises that you do, the sports that you participate in, or your job duties. ? Wear shoes with cushioned soles. ? Avoid high-impact activities or sports that require running and sudden changes in direction. ? Do exercises or physical therapy as told by your doctor. Physical therapy is planned to match your needs and abilities. ? Do exercises that increase your balance and strength, such as tai chi and yoga.  Do not use your injured knee to support your body weight until your doctor says that you can. Use crutches, a cane, or a walker, as told by your doctor. General instructions  Take over-the-counter and prescription medicines only as told by your doctor.  If you are overweight, work with your doctor  and a food expert (dietitian) to set goals to lose weight. Being overweight can make your knee hurt more.  Do not use any products that contain nicotine or tobacco, such as cigarettes, e-cigarettes, and chewing tobacco. If you need help quitting, ask your doctor.  Keep all follow-up visits as told by your doctor. This is important. Contact a doctor if:  You have knee pain that is not getting better or gets worse.  You are not able to do your exercises due to knee pain. Get help right away if:  Your knee swells and the swelling becomes worse.  You cannot move your knee.  You have very bad knee pain. Summary  Knee pain that lasts more than 3 months is considered chronic knee pain.  The main treatments for chronic knee pain are physical therapy and weight loss. You may also need to take medicines, wear a knee sleeve or brace, use  crutches, and put ice or heat on your knee.  Lose weight if you are overweight. Work with your doctor and a food expert (dietitian) to help you set goals to lose weight. Being overweight can make your knee hurt more.  Work with a physical therapist to make a safe exercise program, as told by your doctor. This information is not intended to replace advice given to you by your health care provider. Make sure you discuss any questions you have with your health care provider. Document Revised: 12/15/2018 Document Reviewed: 12/15/2018 Elsevier Patient Education  [place].

## 2020-05-01 NOTE — Chronic Care Management (AMB) (Signed)
Chronic Care Management Pharmacy  Name: MAURYA NETHERY  MRN: [id number] DOB: [birth year]  Chief Complaint/ HPI  Amber Avery,  74 y.o. , female presents for their Initial CCM visit with the clinical pharmacist via telephone due to COVID-19 Pandemic.  PCP : Steele Sizer, MD  Their chronic conditions include:  DM, HTN, HLD, Chronic Pain  Office Visits: 6/25 DM, [place], BP 126/64 P 77 Wt 273 BMI 49.9, compliant with CPAP, OA knee pain  Consult Visit: NA  Medications: Outpatient Encounter Medications as of 04/30/2020  Medication Sig Note   allopurinol (ZYLOPRIM) 300 MG tablet Take 1 tablet (300 mg total) by mouth daily.    atorvastatin (LIPITOR) 40 MG tablet Daily for cholesterol    benazepril (LOTENSIN) 20 MG tablet Take 1 tablet (20 mg total) by mouth daily.    Blood Glucose Monitoring Suppl (TRUE METRIX AIR GLUCOSE METER) w/Device KIT 1 each by Does not apply route 1 day or 1 dose. Checks Fasting Blood Sugar Twice daily Diabetes mellitus with renal manifestations, controlled ([place])      Code: E11.29    diclofenac Sodium (VOLTAREN) 1 % GEL Apply 4 g topically 4 (four) times daily.    Dulaglutide (TRULICITY) 1.5 CV/8.9FY SOPN Inject 1.5 mg into the skin once a week.    furosemide (LASIX) 40 MG tablet Take 1 tablet (40 mg total) by mouth daily.    gabapentin (NEURONTIN) 300 MG capsule TAKE 1 CAPSULE BY MOUTH IN THE MORNING AND 1 IN THE EVENING AND 2 AT NIGHT    glucose blood test strip Use as instructed    Insulin Pen Needle 32G X 6 MM MISC 1 each by Does not apply route 4 (four) times daily. 10/20/2018: Trouble with copay   Lancets (ONETOUCH ULTRASOFT) lancets Use as instructed    potassium chloride SA (KLOR-CON) 20 MEQ tablet Take 1 tablet (20 mEq total) by mouth daily.    triamcinolone ointment (KENALOG) 0.1 % Use BID prn    Vitamin D, Cholecalciferol, 25 MCG (1000 UT) TABS Take 1 capsule by mouth daily.    acetaminophen (TYLENOL) 500 MG tablet Take 1 tablet (500 mg total) by  mouth every 6 (six) hours as needed. (Patient taking differently: Take 1,000 mg by mouth every 8 (eight) hours. )    albuterol (ACCUNEB) 1.25 MG/3ML nebulizer solution Take 3 mLs (1.25 mg total) by nebulization 2 (two) times daily. (Patient not taking: Reported on 04/30/2020) 10/20/2018: Patient has not needed to use nebulizer   budesonide (PULMICORT) 0.25 MG/2ML nebulizer solution Take 2 mLs (0.25 mg total) by nebulization 2 (two) times daily. (Patient not taking: Reported on 04/30/2020) 10/20/2018: Patient has not needed    dexamethasone (DECADRON) 0.1 % ophthalmic solution INSTILL 1 DROP LEY D PRN (Patient not taking: Reported on 04/30/2020)    doxycycline (VIBRA-TABS) 100 MG tablet Take 1 tablet (100 mg total) by mouth 2 (two) times daily. (Patient not taking: Reported on 04/30/2020)    No facility-administered encounter medications on file as of 04/30/2020.      Financial Resource Strain: High Risk   Difficulty of Paying Living Expenses: Hard    Current Diagnosis/Assessment:  Goals Addressed             This Visit's Progress    Chronic Care Management       CARE PLAN ENTRY (see longitudinal plan of care for additional care plan information)  Current Barriers:  Chronic Disease Management support, education, and care coordination needs related to Hypertension, Hyperlipidemia, and  Diabetes   Hyperlipidemia Lab Results  Component Value Date/Time   LDLCALC 67 12/14/2019 10:49 AM   Pharmacist Clinical Goal(s): Over the next 90 days, patient will work with PharmD and providers to maintain LDL goal < 70 Current regimen:  Lipitor 40 mg daily Interventions: None Patient self care activities - Over the next 90 days, patient will: Continue to take atorvastatin daily  Diabetes Lab Results  Component Value Date/Time   HGBA1C 7.0 (A) 04/12/2020 10:54 AM   HGBA1C 6.6 (A) 12/14/2019 10:05 AM   HGBA1C 6.0 04/14/2019 10:33 AM   HGBA1C 6.3 12/08/2018 11:22 AM   Pharmacist Clinical  Goal(s): Over the next 90 days, patient will work with PharmD and providers to achieve A1c goal <7% Current regimen:  Trulicity 0.1VC inject weekly Interventions: Recommended restarting metformin 515m twice daily with meals Patient self care activities - Over the next 90 days, patient will: Check blood sugar once daily, document, and provide at future appointments Contact provider with any episodes of hypoglycemia  Chronic Pain Pharmacist Clinical Goal(s) Over the next 90 days, patient will work with PharmD and providers to decrease pain level Current regimen:  Voltaren gel 1 - 3 times daily as needed Gabapentin 3031mmorning 30066mm sometimes 600m72m bedtime Acetaminophen 1000mg27mee times daily Interventions: Take gabapentin more regularly if possible without daytime sleepiness Patient self care activities - Over the next 90 days, patient will: Increase mobility exercises even while sitting  Medication management Pharmacist Clinical Goal(s): Over the next 90 days, patient will work with PharmD and providers to maintain optimal medication adherence Current pharmacy: [facility] Interventions Comprehensive medication review performed. Continue current medication management strategy Patient self care activities - Over the next 90 days, patient will: Focus on medication adherence by continuing current practices Take medications as prescribed Report any questions or concerns to PharmD and/or provider(s)  Initial goal documentation        Diabetes   Recent Relevant Labs: Lab Results  Component Value Date/Time   HGBA1C 7.0 (A) 04/12/2020 10:54 AM   HGBA1C 6.6 (A) 12/14/2019 10:05 AM   HGBA1C 6.0 04/14/2019 10:33 AM   HGBA1C 6.3 12/08/2018 11:22 AM   MICROALBUR 0.3 12/14/2019 10:49 AM   MICROALBUR 1.2 04/14/2019 11:25 AM   MICROALBUR 20 04/11/2018 10:53 AM   MICROALBUR 20 08/09/2017 09:57 AM     Checking BG: Rarely  Recent FBG Readings: NA Recent pre-meal BG  readings: 112 - 159 Patient is currently uncontrolled on the following medications: Trulicity  Last diabetic Foot exam:  Lab Results  Component Value Date/Time   HMDIABEYEEXA No Retinopathy 04/17/2019 12:00 AM    Last diabetic Eye exam: No results found for: HMDIABFOOTEX   We discussed:  Vitamin D still low Has 5 boxes of Trulicity, gets [facility] [employer] [employer] sister got cancer, stress eating, sister died in April2024/04/23 meals on wheels which helps with portion control Willing to try metformin again   Plan  Patient to inform PharmD of [employer] free Trulicity Recommend restart metformin 500mg 18mb by mouth twice daily with meals  Hyperlipidemia   LDL goal < 70  Lipid Panel     Component Value Date/Time   CHOL 141 12/14/2019 1049   CHOL 123 07/31/2016 0910   TRIG 141 12/14/2019 1049   HDL 52 12/14/2019 1049   HDL 45 07/31/2016 0910   LDLCALC 67 12/14/2019 1049    Hepatic Function Latest Ref Rng & Units 12/14/2019 12/08/2018 04/11/2018  Total Protein 6.1 -  8.1 g/dL 6.6 6.7 6.9  Albumin 3.6 - 5.1 g/dL - - -  AST 10 - 35 U/L 22 27 39(H)  ALT 6 - 29 U/L 22 26 33(H)  Alk Phosphatase 33 - 130 U/L - - -  Total Bilirubin 0.2 - 1.2 mg/dL 0.7 0.6 0.5     The 10-year ASCVD risk score Mikey Bussing DC Jr., et al., 2013) is: 25.5%   Values used to calculate the score:     Age: 70 years     Sex: Female     Is Non-Hispanic African American: No     Diabetic: Yes     Tobacco smoker: No     Systolic Blood Pressure: 614 mmHg     Is BP treated: Yes     HDL Cholesterol: 52 mg/dL     Total Cholesterol: 141 mg/dL   Patient has failed these meds in past: NA Patient is currently controlled on the following medications:  Lipitor 7m daily  We discussed:   Denies myalgias if taken during the day  Plan  Continue current medications  Chronic Pain   Patient has failed these meds in past: NA Patient is currently uncontrolled on the following medications:  gabapentin, APAP, colchicine  We discussed:   GoodRx for Voltaren, once daily, sometimes 3 times daily Last gout attack years ago Doxycycline did not resolve calf/ankle pain Takes gabapentin at lunch if hurting ( 2 - 3 pm), causes somnolence Got nerve block injections APAP 6 tabs daily  Plan  Take gabapentin more regularly if possible Continue current medications  Medication Management   [facility] Uses pill box? Yes Pt endorses 90% compliance Zero copay, less than 1 mile  We discussed:  Pharmacy doesn't match [employer](she says [facility] said [facility] preferred) Kenalog about once daily Getting cataract surgery (worried about sugar increasing with steroid eye drops) Uses GoodRx on several medications for discounts  Plan  Continue current medication management strategy  Follow up: 3 month phone visit  TMilus Height PharmD, [phone]

## 2020-05-16 DIAGNOSIS — J449 Chronic obstructive pulmonary disease, unspecified: Secondary | ICD-10-CM | POA: Diagnosis not present

## 2020-06-02 ENCOUNTER — Other Ambulatory Visit: Payer: Self-pay | Admitting: Family Medicine

## 2020-06-02 DIAGNOSIS — M109 Gout, unspecified: Secondary | ICD-10-CM

## 2020-06-04 LAB — HM DIABETES EYE EXAM

## 2020-06-11 DIAGNOSIS — H2513 Age-related nuclear cataract, bilateral: Secondary | ICD-10-CM | POA: Diagnosis not present

## 2020-06-11 LAB — HM DIABETES EYE EXAM

## 2020-06-16 DIAGNOSIS — J449 Chronic obstructive pulmonary disease, unspecified: Secondary | ICD-10-CM | POA: Diagnosis not present

## 2020-06-20 ENCOUNTER — Ambulatory Visit: Payer: Medicare HMO | Admitting: Pharmacist

## 2020-06-20 ENCOUNTER — Ambulatory Visit (INDEPENDENT_AMBULATORY_CARE_PROVIDER_SITE_OTHER): Payer: Medicare HMO

## 2020-06-20 ENCOUNTER — Other Ambulatory Visit: Payer: Self-pay

## 2020-06-20 VITALS — BP 118/62 | HR 95 | Temp 98.3°F | Resp 16 | Ht 62.0 in | Wt 275.4 lb

## 2020-06-20 DIAGNOSIS — Z0000 Encounter for general adult medical examination without abnormal findings: Secondary | ICD-10-CM | POA: Diagnosis not present

## 2020-06-20 DIAGNOSIS — Z23 Encounter for immunization: Secondary | ICD-10-CM

## 2020-06-20 DIAGNOSIS — E1122 Type 2 diabetes mellitus with diabetic chronic kidney disease: Secondary | ICD-10-CM

## 2020-06-20 DIAGNOSIS — E785 Hyperlipidemia, unspecified: Secondary | ICD-10-CM

## 2020-06-20 NOTE — Progress Notes (Signed)
Subjective:   Amber Avery is a 74 y.o. female who presents for Medicare Annual (Subsequent) preventive examination.  Review of Systems     Cardiac Risk Factors include: advanced age (>15mn, >>55women);diabetes mellitus;dyslipidemia;hypertension;obesity (BMI >30kg/m2);sedentary lifestyle     Objective:    Today's Vitals   06/20/20 1442 06/20/20 1443  BP: 118/62   Pulse: 95   Resp: 16   Temp: 98.3 F (36.8 C)   TempSrc: Oral   SpO2: 97%   Weight: 275 lb 6.4 oz (124.9 kg)   Height: 5' 2"  (1.575 m)   PainSc:  3    Body mass index is 50.37 kg/m.  Advanced Directives 06/20/2019 09/01/2018 08/16/2018 06/17/2018 06/01/2018 04/20/2018 08/09/2017  Does Patient Have a Medical Advance Directive? Yes Yes Yes Yes Yes Yes Yes  Type of AParamedicof AParshallLiving will - HMarshallLiving will HCherrylandLiving will - - HSylvesterLiving will  Does patient want to make changes to medical advance directive? - - - - - - -  Copy of HEversonin Chart? Yes - validated most recent copy scanned in chart (See row information) - Yes Yes - - Yes  Would patient like information on creating a medical advance directive? - - - Yes (MAU/Ambulatory/Procedural Areas - Information given) - - -    Current Medications (verified) Outpatient Encounter Medications as of 06/20/2020  Medication Sig  . acetaminophen (TYLENOL) 500 MG tablet Take 1 tablet (500 mg total) by mouth every 6 (six) hours as needed. (Patient taking differently: Take 1,000 mg by mouth every 8 (eight) hours. )  . allopurinol (ZYLOPRIM) 300 MG tablet Take 1 tablet (300 mg total) by mouth daily.  .[name] Kitchenatorvastatin (LIPITOR) 40 MG tablet Daily for cholesterol  . benazepril (LOTENSIN) 20 MG tablet Take 1 tablet (20 mg total) by mouth daily.  . Blood Glucose Monitoring Suppl (TRUE METRIX AIR GLUCOSE METER) w/Device KIT 1 each by Does not apply route 1  day or 1 dose. Checks Fasting Blood Sugar Twice daily Diabetes mellitus with renal manifestations, controlled ([place]      Code: E11.29  . diclofenac Sodium (VOLTAREN) 1 % GEL Apply 4 g topically 4 (four) times daily.  . Dulaglutide (TRULICITY) 1.5 MZD/6.3OVSOPN Inject 1.5 mg into the skin once a week.  . furosemide (LASIX) 40 MG tablet Take 1 tablet (40 mg total) by mouth daily.  .[name] Kitchengabapentin (NEURONTIN) 300 MG capsule TAKE 1 CAPSULE BY MOUTH IN THE MORNING AND 1 IN THE EVENING AND 2 AT NIGHT  . glucose blood test strip Use as instructed  . Insulin Pen Needle 32G X 6 MM MISC 1 each by Does not apply route 4 (four) times daily.  . Lancets (ONETOUCH ULTRASOFT) lancets Use as instructed  . potassium chloride SA (KLOR-CON) 20 MEQ tablet Take 1 tablet (20 mEq total) by mouth daily.  .[name] Kitchentriamcinolone ointment (KENALOG) 0.1 % Use BID prn  . Vitamin D, Cholecalciferol, 25 MCG (1000 UT) TABS Take 1 capsule by mouth daily.  .[name] Kitchenalbuterol (ACCUNEB) 1.25 MG/3ML nebulizer solution Take 3 mLs (1.25 mg total) by nebulization 2 (two) times daily. (Patient not taking: Reported on 04/30/2020)  . budesonide (PULMICORT) 0.25 MG/2ML nebulizer solution Take 2 mLs (0.25 mg total) by nebulization 2 (two) times daily. (Patient not taking: Reported on 04/30/2020)  . [DISCONTINUED] dexamethasone (DECADRON) 0.1 % ophthalmic solution INSTILL 1 DROP LEY D PRN (Patient not taking: Reported on 04/30/2020)  . [  DISCONTINUED] doxycycline (VIBRA-TABS) 100 MG tablet Take 1 tablet (100 mg total) by mouth 2 (two) times daily. (Patient not taking: Reported on 04/30/2020)   No facility-administered encounter medications on file as of 06/20/2020.    Allergies (verified) Codeine   History: Past Medical History:  Diagnosis Date  . Asthma   . Diabetes mellitus without complication ([place])   . Gout   . Hyperlipidemia   . Hypertension   . Iron deficiency anemia 04/20/2018  . PONV (postoperative nausea and vomiting)   . Shortness of breath  dyspnea   . Sleep apnea    Past Surgical History:  Procedure Laterality Date  . APPENDECTOMY    . CHOLECYSTECTOMY  1977  . COLONOSCOPY WITH PROPOFOL N/A 01/21/2016   Procedure: COLONOSCOPY WITH PROPOFOL;  Surgeon: Lucilla Lame, MD;  Location: [facility] ENDOSCOPY;  Service: Endoscopy;  Laterality: N/A;  . DILATION AND CURETTAGE OF UTERUS     Due to Amenorrhea  . ESOPHAGOGASTRODUODENOSCOPY (EGD) WITH PROPOFOL N/A 08/16/2018   Procedure: ESOPHAGOGASTRODUODENOSCOPY (EGD) WITH PROPOFOL;  Surgeon: Lucilla Lame, MD;  Location: [facility] ENDOSCOPY;  Service: Endoscopy;  Laterality: N/A;  . [place]  . HEMORROIDECTOMY    . HERNIA REPAIR  2011  . Temporal Area Excision Biopsy     For Birth Mark Changes, Negative Pathology  . TONSILLECTOMY AND ADENOIDECTOMY     Family History  Problem Relation Age of Onset  . Cancer Mother        brain tumor  . Kidney disease Mother   . Hypertension Mother   . Heart disease Father   . COPD Father   . Hypertension Sister   . Arthritis Sister   . Cancer Sister   . Heart murmur Sister   . GER disease Sister   . Lupus Sister   . Diabetes Sister   . Arthritis Sister   . Osteopenia Sister   . Heart disease Sister   . Hypertension Sister   . Breast cancer Maternal Aunt 70  . Leukemia Maternal Aunt    Social History   Socioeconomic History  . Marital status: Divorced    Spouse name: Not on file  . Number of children: 0  . Years of education: Not on file  . Highest education level: 12th grade  Occupational History  . Occupation: Retired  Tobacco Use  . Smoking status: Former Smoker    Packs/day: 2.00    Years: 15.00    Pack years: 30.00    Types: Cigarettes    Quit date: 1983    Years since quitting: 38.6  . Smokeless tobacco: Never Used  . Tobacco comment: smoking cessation materials not required  Vaping Use  . Vaping Use: Never used  Substance and Sexual Activity  . Alcohol use: Not Currently    Alcohol/week: 0.0 standard drinks  .  Drug use: No  . Sexual activity: Not Currently  Other Topics Concern  . Not on file  Social History Narrative   Pt lives alone.    Social Determinants of Health   Financial Resource Strain: High Risk  . Difficulty of Paying Living Expenses: Hard  Food Insecurity: No Food Insecurity  . Worried About Charity fundraiser in the Last Year: Never true  . Ran Out of Food in the Last Year: Never true  Transportation Needs: No Transportation Needs  . Lack of Transportation (Medical): No  . Lack of Transportation (Non-Medical): No  Physical Activity: Inactive  . Days of Exercise per Week: 0 days  .  Minutes of Exercise per Session: 0 min  Stress: No Stress Concern Present  . Feeling of Stress : Only a little  Social Connections: Unknown  . Frequency of Communication with Friends and Family: Not on file  . Frequency of Social Gatherings with Friends and Family: Not on file  . Attends Religious Services: Not on file  . Active Member of Clubs or Organizations: Not on file  . Attends Archivist Meetings: Not on file  . Marital Status: Divorced    Tobacco Counseling Counseling given: Not Answered Comment: smoking cessation materials not required   Clinical Intake:  Pre-visit preparation completed: Yes  Pain : 0-10 Pain Score: 3  Pain Type: Chronic pain Pain Location: Knee Pain Orientation: Right, Left Pain Descriptors / Indicators: Aching, Discomfort, Sore Pain Onset: More than a month ago Pain Frequency: Constant     BMI - recorded: 50.37 Nutritional Status: BMI > 30  Obese Nutritional Risks: None Diabetes: Yes CBG done?: No Did pt. bring in CBG monitor from home?: No  How often do you need to have someone help you when you read instructions, pamphlets, or other written materials from your doctor or pharmacy?: 1 - Never  Nutrition Risk Assessment:  Has the patient had any N/V/D within the last 2 months?  No  Does the patient have any non-healing wounds?  No    Has the patient had any unintentional weight loss or weight gain?  No   Diabetes:  Is the patient diabetic?  Yes  If diabetic, was a CBG obtained today?  No  Did the patient bring in their glucometer from home?  No  How often do you monitor your CBG's? daily.   Financial Strains and Diabetes Management:  Are you having any financial strains with the device, your supplies or your medication? Yes . Receives assistance from [place] Does the patient want to be seen by Chronic Care Management for management of their diabetes?  Yes  already enrolled Would the patient like to be referred to a Nutritionist or for Diabetic Management?  No   Diabetic Exams:  Diabetic Eye Exam: Completed 06/04/20 negative retinopathy.   Diabetic Foot Exam: Completed 04/14/19.   Interpreter Needed?: No  Information entered by :: Clemetine Marker LPN   Activities of Daily Living In your present state of health, do you have any difficulty performing the following activities: 06/20/2020 04/12/2020  Hearing? N N  Comment declines hearing aids -  Vision? N Y  Difficulty concentrating or making decisions? N N  Walking or climbing stairs? Y Y  Dressing or bathing? N N  Doing errands, shopping? N Y  Conservation officer, nature and eating ? N -  Using the Toilet? N -  In the past six months, have you accidently leaked urine? Y -  Comment wears pads for protection -  Do you have problems with loss of bowel control? N -  Managing your Medications? N -  Managing your Finances? N -  Housekeeping or managing your Housekeeping? N -  Some recent data might be hidden    Patient Care Team: Steele Sizer, MD as PCP - General (Family Medicine) Bryson Ha, OD as Consulting Physician (Optometry) Sharlet Salina, MD as Consulting Physician (Physical Medicine and Rehabilitation) Diamond Nickel, DO as Consulting Physician (Sports Medicine) Verdia Kuba, [facility] (Pharmacist)  Indicate any recent Medical Services you may have  received from other than [facility] providers in the past year (date may be approximate).     Assessment:  This is a routine wellness examination for Amber Avery.  Hearing/Vision screen  Hearing Screening   125Hz  250Hz  500Hz  1000Hz  2000Hz  3000Hz  4000Hz  6000Hz  8000Hz   Right ear:           Left ear:           Comments: Pt denies hearing difficulty  Vision Screening Comments: Annual vision screenings with Dr. Kerin Ransom   Dietary issues and exercise activities discussed: Current Exercise Habits: The patient does not participate in regular exercise at present, Exercise limited by: orthopedic condition(s);respiratory conditions(s)  Goals    . Chronic Care Management     CARE PLAN ENTRY (see longitudinal plan of care for additional care plan information)  Current Barriers:  . Chronic Disease Management support, education, and care coordination needs related to Hypertension, Hyperlipidemia, and Diabetes   Hyperlipidemia Lab Results  Component Value Date/Time   LDLCALC 67 12/14/2019 10:49 AM   . Pharmacist Clinical Goal(s): o Over the next 90 days, patient will work with PharmD and providers to maintain LDL goal < 70 . Current regimen:  o Lipitor 40 mg daily . Interventions: o None . Patient self care activities - Over the next 90 days, patient will: o Continue to take atorvastatin daily  Diabetes Lab Results  Component Value Date/Time   HGBA1C 7.0 (A) 04/12/2020 10:54 AM   HGBA1C 6.6 (A) 12/14/2019 10:05 AM   HGBA1C 6.0 04/14/2019 10:33 AM   HGBA1C 6.3 12/08/2018 11:22 AM   . Pharmacist Clinical Goal(s): o Over the next 90 days, patient will work with PharmD and providers to achieve A1c goal <7% . Current regimen:  o Trulicity 6.2GB inject weekly . Interventions: o Recommended restarting metformin 580m twice daily with meals . Patient self care activities - Over the next 90 days, patient will: o Check blood sugar once daily, document, and provide at future appointments o Contact  provider with any episodes of hypoglycemia  Chronic Pain . Pharmacist Clinical Goal(s) o Over the next 90 days, patient will work with PharmD and providers to decrease pain level . Current regimen:  o Voltaren gel 1 - 3 times daily as needed o Gabapentin 3039mmorning 30093mm sometimes 600m27m bedtime o Acetaminophen 1000mg94mee times daily . Interventions: o Take gabapentin more regularly if possible without daytime sleepiness . Patient self care activities - Over the next 90 days, patient will: o Increase mobility exercises even while sitting  Medication management . Pharmacist Clinical Goal(s): o Over the next 90 days, patient will work with PharmD and providers to maintain optimal medication adherence . Current pharmacy: [facility] . Interventions o Comprehensive medication review performed. o Continue current medication management strategy . Patient self care activities - Over the next 90 days, patient will: o Focus on medication adherence by continuing current practices o Take medications as prescribed o Report any questions or concerns to PharmD and/or provider(s)  Initial goal documentation       Depression Screen PHQ 2/9 Scores 06/20/2020 04/12/2020 04/12/2020 12/14/2019 08/14/2019 06/20/2019 04/14/2019  PHQ - 2 Score 0 0 0 0 0 0 0  PHQ- 9 Score - 0 0 0 0 - 3    Fall Risk Fall Risk  06/20/2020 04/12/2020 08/14/2019 06/20/2019 04/14/2019  Falls in the past year? 0 0 0 0 0  Number falls in past yr: 0 0 0 0 0  Injury with Fall? 0 0 0 0 0  Risk for fall due to : Impaired mobility;Impaired balance/gait - - - -  Risk for fall due  to: Comment - - - - -  Follow up Falls prevention discussed - - - -    Any stairs in or around the home? Yes  If so, are there any without handrails? No  Home free of loose throw rugs in walkways, pet beds, electrical cords, etc? Yes  Adequate lighting in your home to reduce risk of falls? Yes   ASSISTIVE DEVICES UTILIZED TO PREVENT FALLS:  Life  alert? No  Use of a cane, walker or w/c? Yes  Grab bars in the bathroom? Yes  Shower chair or bench in shower? Yes  Elevated toilet seat or a handicapped toilet? Yes   TIMED UP AND GO:  Was the test performed? Yes .  Length of time to ambulate 10 feet: 8 sec.   Gait slow and steady with assistive device  Cognitive Function:     6CIT Screen 06/20/2020 06/20/2019 06/17/2018 06/14/2017  What Year? 0 points 0 points 0 points 0 points  What month? 0 points 0 points 0 points 0 points  What time? 0 points 0 points 0 points 0 points  Count back from 20 0 points 0 points 0 points 0 points  Months in reverse 0 points 0 points 0 points 4 points  Repeat phrase 0 points 0 points 2 points 4 points  Total Score 0 0 2 8    Immunizations Immunization History  Administered Date(s) Administered  . Fluad Quad(high Dose 65+) 06/20/2020  . Influenza, High Dose Seasonal PF 06/19/2015, 06/29/2016, 06/14/2017, 06/17/2018, 06/22/2019  . Influenza-Unspecified 06/17/2011, 07/11/2019  . PFIZER SARS-COV-2 Vaccination 12/11/2019, 01/01/2020  . Pneumococcal Conjugate-13 11/24/2011, 11/23/2013, 04/09/2017  . Pneumococcal Polysaccharide-23 08/30/2009, 08/30/2009  . Td 08/30/2009  . Tdap 08/30/2009  . Zoster 02/12/2011  . Zoster Recombinat (Shingrix) 07/11/2019, 09/21/2019    TDAP status: Due, Education has been provided regarding the importance of this vaccine. Advised may receive this vaccine at local pharmacy or Health Dept. Aware to provide a copy of the vaccination record if obtained from local pharmacy or Health Dept. Verbalized acceptance and understanding.   Flu Vaccine status: Completed at today's visit   Pneumococcal vaccine status: Up to date   Covid-19 vaccine status: Completed vaccines  Qualifies for Shingles Vaccine? Yes   Zostavax completed Yes   Shingrix Completed?: Yes  Screening Tests Health Maintenance  Topic Date Due  . FOOT EXAM  04/13/2020  . TETANUS/TDAP  12/13/2020 (Originally  08/31/2019)  . MAMMOGRAM  08/15/2020  . HEMOGLOBIN A1C  10/12/2020  . OPHTHALMOLOGY EXAM  06/11/2021  . COLONOSCOPY  01/20/2026  . INFLUENZA VACCINE  Completed  . DEXA SCAN  Completed  . COVID-19 Vaccine  Completed  . Hepatitis C Screening  Completed  . PNA vac Low Risk Adult  Completed    Health Maintenance  Health Maintenance Due  Topic Date Due  . FOOT EXAM  04/13/2020    Colorectal cancer screening: Completed 01/21/16. Repeat every 10 years   Mammogram status: Completed 08/16/19. Repeat every year   Bone Density status: Completed 08/16/19. Results reflect: Bone density results: OSTEOPENIA. Repeat every 2 years.  Lung Cancer Screening: (Low Dose CT Chest recommended if Age 13-80 years, 30 pack-year currently smoking OR have quit w/in 15years.) does not qualify.   Additional Screening:  Hepatitis C Screening: does qualify; Completed 07/14/12.   Vision Screening: Recommended annual ophthalmology exams for early detection of glaucoma and other disorders of the eye. Is the patient up to date with their annual eye exam?  Yes  Who is  the provider or what is the name of the office in which the patient attends annual eye exams? [facility] Screening: Recommended annual dental exams for proper oral hygiene  Community Resource Referral / Chronic Care Management: CRR required this visit?  No   CCM required this visit?  No      Plan:     I have personally reviewed and noted the following in the patient's chart:   . Medical and social history . Use of alcohol, tobacco or illicit drugs  . Current medications and supplements . Functional ability and status . Nutritional status . Physical activity . Advanced directives . List of other physicians . Hospitalizations, surgeries, and ER visits in previous 12 months . Vitals . Screenings to include cognitive, depression, and falls . Referrals and appointments  In addition, I have reviewed and discussed with  patient certain preventive protocols, quality metrics, and best practice recommendations. A written personalized care plan for preventive services as well as general preventive health recommendations were provided to patient.     Clemetine Marker, LPN   10/22/863   Nurse Notes: pt c.o pain in waist area on left side when

## 2020-06-20 NOTE — Chronic Care Management (AMB) (Signed)
Chronic Care Management Pharmacy  Name: Amber Avery  MRN: [id number] DOB: 10/27/1945  Chief Complaint/ HPI  Amber Avery,  74 y.o. , female presents for their Follow-Up CCM visit with the clinical pharmacist face-to-face.  PCP : Steele Sizer, MD  Their chronic conditions include: HLD, DM, Chronic Pain  Office Visits: NA  Consult Visit: NA  Medications: Outpatient Encounter Medications as of 06/20/2020  Medication Sig Note  . acetaminophen (TYLENOL) 500 MG tablet Take 1 tablet (500 mg total) by mouth every 6 (six) hours as needed. (Patient taking differently: Take 1,000 mg by mouth every 8 (eight) hours. )   . albuterol (ACCUNEB) 1.25 MG/3ML nebulizer solution Take 3 mLs (1.25 mg total) by nebulization 2 (two) times daily. (Patient not taking: Reported on 04/30/2020) 10/20/2018: Patient has not needed to use nebulizer  . allopurinol (ZYLOPRIM) 300 MG tablet Take 1 tablet (300 mg total) by mouth daily.   [name] Kitchen atorvastatin (LIPITOR) 40 MG tablet Daily for cholesterol   . benazepril (LOTENSIN) 20 MG tablet Take 1 tablet (20 mg total) by mouth daily.   . Blood Glucose Monitoring Suppl (TRUE METRIX AIR GLUCOSE METER) w/Device KIT 1 each by Does not apply route 1 day or 1 dose. Checks Fasting Blood Sugar Twice daily Diabetes mellitus with renal manifestations, controlled ([place])      Code: E11.29   . budesonide (PULMICORT) 0.25 MG/2ML nebulizer solution Take 2 mLs (0.25 mg total) by nebulization 2 (two) times daily. (Patient not taking: Reported on 04/30/2020) 10/20/2018: Patient has not needed   . dexamethasone (DECADRON) 0.1 % ophthalmic solution INSTILL 1 DROP LEY D PRN (Patient not taking: Reported on 04/30/2020)   . diclofenac Sodium (VOLTAREN) 1 % GEL Apply 4 g topically 4 (four) times daily.   [name] Kitchen doxycycline (VIBRA-TABS) 100 MG tablet Take 1 tablet (100 mg total) by mouth 2 (two) times daily. (Patient not taking: Reported on 04/30/2020)   . Dulaglutide (TRULICITY) 1.5 JI/9.6VE SOPN Inject  1.5 mg into the skin once a week.   . furosemide (LASIX) 40 MG tablet Take 1 tablet (40 mg total) by mouth daily.   [name] Kitchen gabapentin (NEURONTIN) 300 MG capsule TAKE 1 CAPSULE BY MOUTH IN THE MORNING AND 1 IN THE EVENING AND 2 AT NIGHT   . glucose blood test strip Use as instructed   . Insulin Pen Needle 32G X 6 MM MISC 1 each by Does not apply route 4 (four) times daily. 10/20/2018: Trouble with copay  . Lancets (ONETOUCH ULTRASOFT) lancets Use as instructed   . potassium chloride SA (KLOR-CON) 20 MEQ tablet Take 1 tablet (20 mEq total) by mouth daily.   [name] Kitchen triamcinolone ointment (KENALOG) 0.1 % Use BID prn   . Vitamin D, Cholecalciferol, 25 MCG (1000 UT) TABS Take 1 capsule by mouth daily.    No facility-administered encounter medications on file as of 06/20/2020.      Financial Resource Strain: High Risk  . Difficulty of Paying Living Expenses: Hard    Current Diagnosis/Assessment:  Goals Addressed   None     Diabetes   Recent Relevant Labs: Lab Results  Component Value Date/Time   HGBA1C 7.0 (A) 04/12/2020 10:54 AM   HGBA1C 6.6 (A) 12/14/2019 10:05 AM   HGBA1C 6.0 04/14/2019 10:33 AM   HGBA1C 6.3 12/08/2018 11:22 AM   MICROALBUR 0.3 12/14/2019 10:49 AM   MICROALBUR 1.2 04/14/2019 11:25 AM   MICROALBUR 20 04/11/2018 10:53 AM   MICROALBUR 20 08/09/2017 09:57 AM  Patient has failed these meds in past: Victoza (cost) Patient is currently controlled on the following medications: Trulicity, metformin  Last diabetic Foot exam:  Lab Results  Component Value Date/Time   HMDIABEYEEXA No Retinopathy 06/11/2020 12:00 AM    Last diabetic Eye exam: No results found for: HMDIABFOOTEX   We discussed:  Still getting Trulicity? 6 whole boxes Still willing to try metformin again? Yes, already started back (update list) Taking gabapentin more regularly? No, makes her sleepy  Stress eating What about chewable aspirin, ask pharmacists. A: No, too risky Cataracts 10/27 left eye, 11/17  right eye  Plan  Continue current medications  Hyperlipidemia   LDL goal < 70  Lipid Panel     Component Value Date/Time   CHOL 141 12/14/2019 1049   CHOL 123 07/31/2016 0910   TRIG 141 12/14/2019 1049   HDL 52 12/14/2019 1049   HDL 45 07/31/2016 0910   LDLCALC 67 12/14/2019 1049    Hepatic Function Latest Ref Rng & Units 12/14/2019 12/08/2018 04/11/2018  Total Protein 6.1 - 8.1 g/dL 6.6 6.7 6.9  Albumin 3.6 - 5.1 g/dL - - -  AST 10 - 35 U/L 22 27 39(H)  ALT 6 - 29 U/L 22 26 33(H)  Alk Phosphatase 33 - 130 U/L - - -  Total Bilirubin 0.2 - 1.2 mg/dL 0.7 0.6 0.5     The 10-year ASCVD risk score Mikey Bussing DC Jr., et al., 2013) is: 25.5%   Values used to calculate the score:     Age: 50 years     Sex: Female     Is Non-Hispanic African American: No     Diabetic: Yes     Tobacco smoker: No     Systolic Blood Pressure: 795 mmHg     Is BP treated: Yes     HDL Cholesterol: 52 mg/dL     Total Cholesterol: 141 mg/dL   Patient has failed these meds in past: NA Patient is currently controlled on the following medications:  . Lipitor 108m daily  We discussed:  At goal Denies myalgias  Plan  Continue current medications  Medication Management   [place] Uses pill box? Yes Pt endorses 100% compliance  We discussed:  GoodRx still working? Yes, had detailed list showing each medication cost   Plan  Continue current medication management strategy  Follow up: 3 month phone visit  TMilus Height PharmD, [phone]

## 2020-06-20 NOTE — Patient Instructions (Addendum)
Ms. Benedicto , Thank you for taking time to come for your Medicare Wellness Visit. I appreciate your ongoing commitment to your health goals. Please review the following plan we discussed and let me know if I can assist you in the future.   Screening recommendations/referrals: Colonoscopy: done 01/21/16 Mammogram: done 08/16/19. Please call [phone] to schedule your mammogram.  Bone Density: done 08/16/19 Recommended yearly ophthalmology/optometry visit for glaucoma screening and checkup Recommended yearly dental visit for hygiene and checkup  Vaccinations: Influenza vaccine: done today Pneumococcal vaccine: done 04/09/17 Tdap vaccine: DUE Shingles vaccine: done 07/11/19 & 09/21/19   Covid-19:done 12/11/19 & 01/01/20  Conditions/risks identified: Recommend increasing physical activity as tolerated  Next appointment: Follow up in one year for your annual wellness visit    Preventive Care 54 Years and Older, Female Preventive care refers to lifestyle choices and visits with your health care provider that can promote health and wellness. What does preventive care include?  A yearly physical exam. This is also called an annual well check.  Dental exams once or twice a year.  Routine eye exams. Ask your health care provider how often you should have your eyes checked.  Personal lifestyle choices, including:  Daily care of your teeth and gums.  Regular physical activity.  Eating a healthy diet.  Avoiding tobacco and drug use.  Limiting alcohol use.  Practicing safe sex.  Taking low-dose aspirin every day.  Taking vitamin and mineral supplements as recommended by your health care provider. What happens during an annual well check? The services and screenings done by your health care provider during your annual well check will depend on your age, overall health, lifestyle risk factors, and family history of disease. Counseling  Your health care provider may ask you questions  about your:  Alcohol use.  Tobacco use.  Drug use.  Emotional well-being.  Home and relationship well-being.  Sexual activity.  Eating habits.  History of falls.  Memory and ability to understand (cognition).  Work and work Statistician.  Reproductive health. Screening  You may have the following tests or measurements:  Height, weight, and BMI.  Blood pressure.  Lipid and cholesterol levels. These may be checked every 5 years, or more frequently if you are over 41 years old.  Skin check.  Lung cancer screening. You may have this screening every year starting at age 68 if you have a 30-pack-year history of smoking and currently smoke or have quit within the past 15 years.  Fecal occult blood test (FOBT) of the stool. You may have this test every year starting at age 45.  Flexible sigmoidoscopy or colonoscopy. You may have a sigmoidoscopy every 5 years or a colonoscopy every 10 years starting at age 57.  Hepatitis C blood test.  Hepatitis B blood test.  Sexually transmitted disease (STD) testing.  Diabetes screening. This is done by checking your blood sugar (glucose) after you have not eaten for a while (fasting). You may have this done every 1-3 years.  Bone density scan. This is done to screen for osteoporosis. You may have this done starting at age 6.  Mammogram. This may be done every 1-2 years. Talk to your health care provider about how often you should have regular mammograms. Talk with your health care provider about your test results, treatment options, and if necessary, the need for more tests. Vaccines  Your health care provider may recommend certain vaccines, such as:  Influenza vaccine. This is recommended every year.  Tetanus, diphtheria, and  acellular pertussis (Tdap, Td) vaccine. You may need a Td booster every 10 years.  Zoster vaccine. You may need this after age 72.  Pneumococcal 13-valent conjugate (PCV13) vaccine. One dose is  recommended after age 48.  Pneumococcal polysaccharide (PPSV23) vaccine. One dose is recommended after age 17. Talk to your health care provider about which screenings and vaccines you need and how often you need them. This information is not intended to replace advice given to you by your health care provider. Make sure you discuss any questions you have with your health care provider. Document Released: 11/01/2015 Document Revised: 06/24/2016 Document Reviewed: 08/06/2015 Elsevier Interactive Patient Education  2017 [place] Prevention in the Home Falls can cause injuries. They can happen to people of all ages. There are many things you can do to make your home safe and to help prevent falls. What can I do on the outside of my home?  Regularly fix the edges of walkways and driveways and fix any cracks.  Remove anything that might make you trip as you walk through a door, such as a raised step or threshold.  Trim any bushes or trees on the path to your home.  Use bright outdoor lighting.  Clear any walking paths of anything that might make someone trip, such as rocks or tools.  Regularly check to see if handrails are loose or broken. Make sure that both sides of any steps have handrails.  Any raised decks and porches should have guardrails on the edges.  Have any leaves, snow, or ice cleared regularly.  Use sand or salt on walking paths during winter.  Clean up any spills in your garage right away. This includes oil or grease spills. What can I do in the bathroom?  Use night lights.  Install grab bars by the toilet and in the tub and shower. Do not use towel bars as grab bars.  Use non-skid mats or decals in the tub or shower.  If you need to sit down in the shower, use a plastic, non-slip stool.  Keep the floor dry. Clean up any water that spills on the floor as soon as it happens.  Remove soap buildup in the tub or shower regularly.  Attach bath mats  securely with double-sided non-slip rug tape.  Do not have throw rugs and other things on the floor that can make you trip. What can I do in the bedroom?  Use night lights.  Make sure that you have a light by your bed that is easy to reach.  Do not use any sheets or blankets that are too big for your bed. They should not hang down onto the floor.  Have a firm chair that has side arms. You can use this for support while you get dressed.  Do not have throw rugs and other things on the floor that can make you trip. What can I do in the kitchen?  Clean up any spills right away.  Avoid walking on wet floors.  Keep items that you use a lot in easy-to-reach places.  If you need to reach something above you, use a strong step stool that has a grab bar.  Keep electrical cords out of the way.  Do not use floor polish or wax that makes floors slippery. If you must use wax, use non-skid floor wax.  Do not have throw rugs and other things on the floor that can make you trip. What can I do with my stairs?  Do not leave any items on the stairs.  Make sure that there are handrails on both sides of the stairs and use them. Fix handrails that are broken or loose. Make sure that handrails are as long as the stairways.  Check any carpeting to make sure that it is firmly attached to the stairs. Fix any carpet that is loose or worn.  Avoid having throw rugs at the top or bottom of the stairs. If you do have throw rugs, attach them to the floor with carpet tape.  Make sure that you have a light switch at the top of the stairs and the bottom of the stairs. If you do not have them, ask someone to add them for you. What else can I do to help prevent falls?  Wear shoes that:  Do not have high heels.  Have rubber bottoms.  Are comfortable and fit you well.  Are closed at the toe. Do not wear sandals.  If you use a stepladder:  Make sure that it is fully opened. Do not climb a closed  stepladder.  Make sure that both sides of the stepladder are locked into place.  Ask someone to hold it for you, if possible.  Clearly mark and make sure that you can see:  Any grab bars or handrails.  First and last steps.  Where the edge of each step is.  Use tools that help you move around (mobility aids) if they are needed. These include:  Canes.  Walkers.  Scooters.  Crutches.  Turn on the lights when you go into a dark area. Replace any light bulbs as soon as they burn out.  Set up your furniture so you have a clear path. Avoid moving your furniture around.  If any of your floors are uneven, fix them.  If there are any pets around you, be aware of where they are.  Review your medicines with your doctor. Some medicines can make you feel dizzy. This can increase your chance of falling. Ask your doctor what other things that you can do to help prevent falls. This information is not intended to replace advice given to you by your health care provider. Make sure you discuss any questions you have with your health care provider. Document Released: 08/01/2009 Document Revised: 03/12/2016 Document Reviewed: 11/09/2014 Elsevier Interactive Patient Education  2017 [place].  Influenza (Flu) Vaccine (Inactivated or Recombinant): What You Need to Know 1. Why get vaccinated? Influenza vaccine can prevent influenza (flu). Flu is a contagious disease that spreads around the [place] every year, usually between October and May. Anyone can get the flu, but it is more dangerous for some people. Infants and young children, people 49 years of age and older, pregnant women, and people with certain health conditions or a weakened immune system are at greatest risk of flu complications. Pneumonia, bronchitis, sinus infections and ear infections are examples of flu-related complications. If you have a medical condition, such as heart disease, cancer or diabetes, flu can make it  worse. Flu can cause fever and chills, sore throat, muscle aches, fatigue, cough, headache, and runny or stuffy nose. Some people may have vomiting and diarrhea, though this is more common in children than adults. Each year thousands of people in the [place] die from flu, and many more are hospitalized. Flu vaccine prevents millions of illnesses and flu-related visits to the doctor each year. 2. Influenza vaccine CDC recommends everyone 71 months of age and older get vaccinated every  flu season. Children 6 months through 25 years of age may need 2 doses during a single flu season. Everyone else needs only 1 dose each flu season. It takes about 2 weeks for protection to develop after vaccination. There are many flu viruses, and they are always changing. Each year a new flu vaccine is made to protect against three or four viruses that are likely to cause disease in the upcoming flu season. Even when the vaccine doesn't exactly match these viruses, it may still provide some protection. Influenza vaccine does not cause flu. Influenza vaccine may be given at the same time as other vaccines. 3. Talk with your health care provider Tell your vaccine provider if the person getting the vaccine:  Has had an allergic reaction after a previous dose of influenza vaccine, or has any severe, life-threatening allergies.  Has ever had Guillain-Barr Syndrome (also called GBS). In some cases, your health care provider may decide to postpone influenza vaccination to a future visit. People with minor illnesses, such as a cold, may be vaccinated. People who are moderately or severely ill should usually wait until they recover before getting influenza vaccine. Your health care provider can give you more information. 4. Risks of a vaccine reaction  Soreness, redness, and swelling where shot is given, fever, muscle aches, and headache can happen after influenza vaccine.  There may be a very small increased risk of  Guillain-Barr Syndrome (GBS) after inactivated influenza vaccine (the flu shot). Young children who get the flu shot along with pneumococcal vaccine (PCV13), and/or DTaP vaccine at the same time might be slightly more likely to have a seizure caused by fever. Tell your health care provider if a child who is getting flu vaccine has ever had a seizure. People sometimes faint after medical procedures, including vaccination. Tell your provider if you feel dizzy or have vision changes or ringing in the ears. As with any medicine, there is a very remote chance of a vaccine causing a severe allergic reaction, other serious injury, or death. 5. What if there is a serious problem? An allergic reaction could occur after the vaccinated person leaves the clinic. If you see signs of a severe allergic reaction (hives, swelling of the face and throat, difficulty breathing, a fast heartbeat, dizziness, or weakness), call 9-1-1 and get the person to the nearest hospital. For other signs that concern you, call your health care provider. Adverse reactions should be reported to the Vaccine Adverse Event Reporting System (VAERS). Your health care provider will usually file this report, or you can do it yourself. Visit the VAERS website at www.vaers.SamedayNews.es or call 5010868201.VAERS is only for reporting reactions, and VAERS staff do not give medical advice. 6. The National Vaccine Injury Compensation Program The [employer] Vaccine Injury Compensation Program (VICP) is a federal program that was created to compensate people who may have been injured by certain vaccines. Visit the VICP website at GoldCloset.com.ee or call 867-242-0321 to learn about the program and about filing a claim. There is a time limit to file a claim for compensation. 7. How can I learn more?  Ask your healthcare provider.  Call your local or state health department.  Contact the Centers for Disease Control and Prevention  (CDC): ? Call 215 825 5631 (1-800-CDC-INFO) or ? Visit CDC's https://gibson.com/ Vaccine Information Statement (Interim) Inactivated Influenza Vaccine (06/02/2018) This information is not intended to replace advice given to you by your health care provider. Make sure you discuss any questions you have with your health  care provider. Document Revised: 01/24/2019 Document Reviewed: 06/06/2018 Elsevier Patient Education  [place].

## 2020-06-23 NOTE — Patient Instructions (Addendum)
Visit Information  Goals Addressed            This Visit's Progress   . Chronic Care Management       CARE PLAN ENTRY (see longitudinal plan of care for additional care plan information)  Current Barriers:  . Chronic Disease Management support, education, and care coordination needs related to Hypertension, Hyperlipidemia, and Diabetes   Hyperlipidemia Lab Results  Component Value Date/Time   LDLCALC 67 12/14/2019 10:49 AM   . Pharmacist Clinical Goal(s): o Over the next 90 days, patient will work with PharmD and providers to maintain LDL goal < 70 . Current regimen:  o Lipitor 40 mg daily . Interventions: o None . Patient self care activities - Over the next 90 days, patient will: o Continue to take atorvastatin daily  Diabetes Lab Results  Component Value Date/Time   HGBA1C 7.0 (A) 04/12/2020 10:54 AM   HGBA1C 6.6 (A) 12/14/2019 10:05 AM   HGBA1C 6.0 04/14/2019 10:33 AM   HGBA1C 6.3 12/08/2018 11:22 AM   . Pharmacist Clinical Goal(s): o Over the next 90 days, patient will work with PharmD and providers to achieve A1c goal <7% . Current regimen:  o Trulicity 1.5mg  inject weekly o Metformin 500mg  twice daily . Interventions: o None . Patient self care activities - Over the next 90 days, patient will: o Check blood sugar once daily, document, and provide at future appointments o Contact provider with any episodes of hypoglycemia  Chronic Pain . Pharmacist Clinical Goal(s) o Over the next 90 days, patient will work with PharmD and providers to decrease pain level . Current regimen:  o Voltaren gel 1 - 3 times daily as needed o Gabapentin 300mg  morning 300mg  pm sometimes 600mg  at bedtime o Acetaminophen 1000mg  three times daily . Interventions: o Take gabapentin more regularly if possible without daytime sleepiness . Patient self care activities - Over the next 90 days, patient will: o Increase mobility exercises even while sitting  Medication  management . Pharmacist Clinical Goal(s): o Over the next 90 days, patient will work with PharmD and providers to maintain optimal medication adherence . Current pharmacy: [facility] . Interventions o Comprehensive medication review performed. o Continue current medication management strategy . Patient self care activities - Over the next 90 days, patient will: o Focus on medication adherence by continuing current practices o Take medications as prescribed o Report any questions or concerns to PharmD and/or provider(s)  Initial goal documentation        Print copy of patient instructions provided.   Telephone follow up appointment with pharmacy team member scheduled for: 3 months  Milus Height, PharmD, [place], [facility] [phone]  Diabetic Nephropathy  Diabetic nephropathy is kidney disease that is caused by diabetes (diabetes mellitus). Kidneys are organs that filter and clean blood and get rid of body waste products and extra fluid. Diabetes can cause gradual kidney damage over many years. Diabetic nephropathy that continues to get worse can lead to kidney failure. What are the causes? This condition is caused by kidney damage from diabetes that is not well controlled with treatment. Having high blood sugar (glucose) for a long time because of diabetes can damage blood vessels in the kidneys and cause them to thicken and become scarred. Those changes prevent the kidneys from functioning normally. What increases the risk? This condition is more likely to develop in people with diabetes who:  Have had diabetes for many years.  Have high blood pressure.  Have high  blood glucose levels over a long period of time.  Have a family history of kidney disease.  Have a history of tobacco use.  Have certain genes that are passed from parent to child (inherited). What are the signs or symptoms? This condition may not cause symptoms at first.  If you do have symptoms, they may include:  Swelling of your hands, feet, or ankles.  Weakness.  Poor appetite.  Nausea.  Confusion.  Tiredness (fatigue).  Trouble sleeping.  Dry, itchy skin. If nephropathy leads to kidney failure, symptoms may include:  Vomiting.  Shortness of breath.  Jerky movements that you cannot control (seizure).  Coma. How is this diagnosed? It is important to diagnose this condition before symptoms develop. You may be screened for diabetic nephropathy at a routine health care visit. Screening tests may include:  Urine tests. These may be done every year.  Urine collection over a 24-hour period to measure kidney function.  Blood tests to measure blood glucose levels and kidney function.  Regular blood pressure monitoring. If your health care provider suspects diabetic nephropathy, he or she may:  Review your medical history and symptoms.  Do a physical exam.  Do an ultrasound of your kidneys.  Perform a procedure to take a sample of kidney tissue for testing (biopsy). How is this treated? The goal of treatment is to prevent or slow down any damage to your kidneys by managing your diabetes. To do this, it is important to control:  Your blood pressure. ? Your target blood pressure may vary depending on your medical conditions, your age, and other factors. ? To help control blood pressure, you may be prescribed medicines to lower your blood pressure (ACE inhibitors) or to help your body get rid of excess fluid (diuretics).  Your A1c (hemoglobin A1c) level. Generally, the goal of treatment is to maintain an A1c level of less than 7%.  Your blood glucose level.  Your blood lipids. If you have high cholesterol, you may need to take lipid-lowering drugs, such as statins. Other treatments may include:  Medicines, including insulin injections.  Lifestyle changes, such as losing weight, quitting smoking, or making changes to your diet. If  your disease progresses to end-stage kidney failure, treatment may include:  Dialysis. This is a procedure to filter your blood with a machine.  Kidney transplant. Follow these instructions at home: Eating and drinking  Eat healthy foods, and eat healthy snacks between meals. Follow instructions from your health care provider about eating and drinking restrictions.  Limit your sodium (salt), protein, or fluid intake as directed.  If you drink alcohol: ? Limit how much you use to:  0-1 drink a day for nonpregnant women.  0-2 drinks a day for men. ? Be aware of how much alcohol is in your drink. In the [place] one drink equals one 12 oz bottle of beer (355 mL), one 5 oz glass of wine (148 mL), or one 1 oz glass of hard liquor (44 mL). Lifestyle  Maintain a healthy weight. Work with your health care provider to lose weight, if needed.  Do not use any products that contain nicotine or tobacco, such as cigarettes, e-cigarettes, and chewing tobacco. If you need help quitting, ask your health care provider.  Be physically active every day. Ask your health care provider what type of exercise is best for you.  Work with your health care provider to manage your blood pressure. General instructions      Follow your diabetes management plan  as directed. ? Check your blood glucose levels as directed by your health care provider. ? Keep your blood glucose in your target range as directed by your health care provider. ? Have your A1c level checked two or more times a year, or as often as told by your health care provider.  Measure your blood pressure regularly at home, as told by your health care provider.  Take over-the-counter and prescription medicines only as told by your health care provider. These include insulin and supplements.  Keep all follow-up visits and routine visits as told by your health care provider. This is important. Make sure you get screening tests as directed. Where  to find more information American Diabetes Association: www.diabetes.org Contact a health care provider if:  You have trouble keeping your blood glucose in your goal range.  Your blood glucose level is higher than 240 mg/dL (13.3 mmol/L) for 2 days in a row.  You have swelling in your hands, ankles, or feet.  You feel weak, tired, or dizzy.  You have sudden muscle tightening (spasms).  You have nausea or vomiting.  You feel tired all the time. Get help right away if:  You are very sleepy.  You faint.  You have: ? A seizure. ? Severe, painful muscle spasms. ? Shortness of breath. ? Chest pain. Summary  Diabetic nephropathy is kidney disease that is caused by diabetes (diabetes mellitus).  Keep your blood sugar (glucose) in your target range as directed by your health care provider.  Work with your health care provider to manage your blood pressure.  Keep all follow-up visits and routine visits as told by your health care provider. This is important. Make sure you get screening tests as directed. This information is not intended to replace advice given to you by your health care provider. Make sure you discuss any questions you have with your health care provider. Document Revised: 03/20/2019 Document Reviewed: 03/20/2019 Elsevier Patient Education  [place].

## 2020-07-01 ENCOUNTER — Other Ambulatory Visit: Payer: Self-pay | Admitting: Family Medicine

## 2020-07-01 DIAGNOSIS — I10 Essential (primary) hypertension: Secondary | ICD-10-CM

## 2020-07-03 ENCOUNTER — Other Ambulatory Visit: Payer: Self-pay | Admitting: Family Medicine

## 2020-07-17 DIAGNOSIS — J449 Chronic obstructive pulmonary disease, unspecified: Secondary | ICD-10-CM | POA: Diagnosis not present

## 2020-08-01 DIAGNOSIS — H2512 Age-related nuclear cataract, left eye: Secondary | ICD-10-CM | POA: Diagnosis not present

## 2020-08-01 DIAGNOSIS — E119 Type 2 diabetes mellitus without complications: Secondary | ICD-10-CM | POA: Diagnosis not present

## 2020-08-04 ENCOUNTER — Other Ambulatory Visit: Payer: Self-pay | Admitting: Family Medicine

## 2020-08-04 DIAGNOSIS — I10 Essential (primary) hypertension: Secondary | ICD-10-CM

## 2020-08-04 DIAGNOSIS — N1831 Chronic kidney disease, stage 3a: Secondary | ICD-10-CM

## 2020-08-04 NOTE — Telephone Encounter (Signed)
Requested Prescriptions  Pending Prescriptions Disp Refills  . benazepril (LOTENSIN) 20 MG tablet [Pharmacy Med Name: Benazepril HCl 20 MG Oral Tablet] 90 tablet 0    Sig: Take 1 tablet by mouth once daily     Cardiovascular:  ACE Inhibitors Failed - 08/04/2020 10:08 AM      Failed - Cr in normal range and within 180 days    Creat  Date Value Ref Range Status  12/14/2019 1.16 (H) 0.60 - 0.93 mg/dL Final    Comment:    For patients >73 years of age, the reference limit for Creatinine is approximately 13% higher for people identified as African-American. .    Creatinine, Urine  Date Value Ref Range Status  12/14/2019 23 20 - 275 mg/dL Final         Failed - K in normal range and within 180 days    Potassium  Date Value Ref Range Status  12/14/2019 4.8 3.5 - 5.3 mmol/L Final         Passed - Patient is not pregnant      Passed - Last BP in normal range    BP Readings from Last 1 Encounters:  06/20/20 118/62         Passed - Valid encounter within last 6 months    Recent Outpatient Visits          3 months ago Controlled type 2 diabetes mellitus with stage 3 chronic kidney disease, without long-term current use of insulin ([place])   [facility] [place], Drue Stager, MD   7 months ago Controlled type 2 diabetes mellitus with stage 3 chronic kidney disease, without long-term current use of insulin [facility])   [facility] [place], Drue Stager, MD   11 months ago Controlled type 2 diabetes mellitus with stage 3 chronic kidney disease, without long-term current use of insulin [facility])   [facility] [place], Drue Stager, MD   1 year ago Controlled type 2 diabetes mellitus with stage 3 chronic kidney disease, without long-term current use of insulin [facility])   [facility] [place], Drue Stager, MD   1 year ago Controlled type 2 diabetes mellitus with stage 2 chronic kidney disease, unspecified whether long term insulin use [facility])    [facility] Steele Sizer, MD      Future Appointments            In 1 week Steele Sizer, MD [facility], [place]   In 10 months  [facility], [facility]

## 2020-08-07 ENCOUNTER — Other Ambulatory Visit: Payer: Self-pay

## 2020-08-07 ENCOUNTER — Encounter: Payer: Self-pay | Admitting: Ophthalmology

## 2020-08-09 ENCOUNTER — Telehealth: Payer: Self-pay | Admitting: Pharmacist

## 2020-08-12 ENCOUNTER — Encounter: Payer: Self-pay | Admitting: Family Medicine

## 2020-08-12 ENCOUNTER — Ambulatory Visit (INDEPENDENT_AMBULATORY_CARE_PROVIDER_SITE_OTHER): Payer: Medicare HMO | Admitting: Family Medicine

## 2020-08-12 ENCOUNTER — Other Ambulatory Visit: Payer: Self-pay

## 2020-08-12 ENCOUNTER — Other Ambulatory Visit
Admission: RE | Admit: 2020-08-12 | Discharge: 2020-08-12 | Disposition: A | Discharge status: Home / Self Care | Payer: Medicare HMO | Source: Ambulatory Visit | Attending: Ophthalmology | Admitting: Ophthalmology

## 2020-08-12 VITALS — BP 100/70 | HR 95 | Temp 97.8°F | Resp 16 | Ht 62.0 in | Wt 270.4 lb

## 2020-08-12 DIAGNOSIS — J4520 Mild intermittent asthma, uncomplicated: Secondary | ICD-10-CM | POA: Diagnosis not present

## 2020-08-12 DIAGNOSIS — D692 Other nonthrombocytopenic purpura: Secondary | ICD-10-CM | POA: Diagnosis not present

## 2020-08-12 DIAGNOSIS — M1990 Unspecified osteoarthritis, unspecified site: Secondary | ICD-10-CM | POA: Diagnosis not present

## 2020-08-12 DIAGNOSIS — Z23 Encounter for immunization: Secondary | ICD-10-CM | POA: Diagnosis not present

## 2020-08-12 DIAGNOSIS — N1831 Chronic kidney disease, stage 3a: Secondary | ICD-10-CM

## 2020-08-12 DIAGNOSIS — E785 Hyperlipidemia, unspecified: Secondary | ICD-10-CM

## 2020-08-12 DIAGNOSIS — R2681 Unsteadiness on feet: Secondary | ICD-10-CM

## 2020-08-12 DIAGNOSIS — N183 Chronic kidney disease, stage 3 unspecified: Secondary | ICD-10-CM | POA: Diagnosis not present

## 2020-08-12 DIAGNOSIS — E1122 Type 2 diabetes mellitus with diabetic chronic kidney disease: Secondary | ICD-10-CM

## 2020-08-12 DIAGNOSIS — Z20822 Contact with and (suspected) exposure to covid-19: Secondary | ICD-10-CM | POA: Insufficient documentation

## 2020-08-12 DIAGNOSIS — M170 Bilateral primary osteoarthritis of knee: Secondary | ICD-10-CM

## 2020-08-12 DIAGNOSIS — I10 Essential (primary) hypertension: Secondary | ICD-10-CM

## 2020-08-12 DIAGNOSIS — E538 Deficiency of other specified B group vitamins: Secondary | ICD-10-CM

## 2020-08-12 DIAGNOSIS — W540XXA Bitten by dog, initial encounter: Secondary | ICD-10-CM | POA: Diagnosis not present

## 2020-08-12 DIAGNOSIS — Z01812 Encounter for preprocedural laboratory examination: Secondary | ICD-10-CM | POA: Diagnosis present

## 2020-08-12 DIAGNOSIS — M109 Gout, unspecified: Secondary | ICD-10-CM | POA: Diagnosis not present

## 2020-08-12 DIAGNOSIS — R600 Localized edema: Secondary | ICD-10-CM

## 2020-08-12 LAB — SARS CORONAVIRUS 2 (TAT 6-24 HRS): SARS Coronavirus 2: NEGATIVE

## 2020-08-12 LAB — POCT GLYCOSYLATED HEMOGLOBIN (HGB A1C): Hemoglobin A1C: 6.8 % — AB (ref 4.0–5.6)

## 2020-08-12 MED ORDER — ALBUTEROL SULFATE HFA 108 (90 BASE) MCG/ACT IN AERS: 2.0000 | INHALATION_SPRAY | Freq: Four times a day (QID) | RESPIRATORY_TRACT | 0 refills | Status: DC | PRN

## 2020-08-12 NOTE — Progress Notes (Signed)
Name: Amber Avery   MRN: [id number]    DOB: 08-30-1946   Date:08/12/2020       Progress Note  Subjective  Chief Complaint  Chief Complaint  Patient presents with  . Diabetes  . Hypertension  . Dyslipidemia    HPI  Dyslipidemia: taking statin therapy, no side effects, no chest painor myalgia. Last LDL 67, HDL was at goal at 52   Dog bite: happened one week ago, at home, her own pet, vaccinated, nipped at her finger, she is due for Tdap and will receive it today   AsthmaMild intermittent: she states she is doing much better since she started showering daily, she has a cough daily, but only during her shower, no wheezing or SOB.She uses albuterol very seldom, we will send a rescue inhaler to place in her purse .    DM with renal manifestation: HgbA1C on her last visit was  7 % today is down to 6.8 % , back on a healthier diet. She is on Trulicity, no polyphagia, polyuria (stable because of diuretic) or polydipsia . Continue medication. She has dyslipidemia and is doing well on statin therapy . She has some burning sensation on her legs during the night, on gabapentin and it is helping.   Obesity:weight is stable, still trying to be active, she still has a FitBit and trying to get 2-3 thousand per day, she likes to bake but using sugar substitute  OSA: she wear machine every night, she uses oxygen with CPAP and is compliant   Right thyroid nodule; seen by Endo at [facility], biopsy done and negative.Unchanged   OA : shesaw Ortho, Dr. Claretha Cooper he advised her not to have surgery because of her weight, she had one round of hyaluronic injection without help and it was too expensive. She is still taking Tylenol Shestill has intermittent right kneeeffusion when more active, no redness or increase in warmth. She has a hoveround, uses walker and sometimes cane.She is still trying to keep herself mobile    Iron deficiency anemia:last level was better, under the care of  Dr. Marlis Edelson had 5 iron infusions, normal HCT 11/2019    Trigger Thumb: right side, she states not currently causing problems, she will think about itShe also has ganglion cyst   Senile purpura: she states bruising is better now, stable   Urinary incontinence: she states symptoms are getting worse, she states she has been wearing a pad sometimes a Depend, cannot make in the time to the bathroom, she has OA and she avoids getting up and when she needs to go she does not have enough time to make it. Discussed schedule her bathroom breaks  Patient Active Problem List   Diagnosis Date Noted  . Cellulitis of right ankle 04/12/2020  . BMI 50.0-59.9, adult ([place]) 02/06/2020  . History of gastritis 08/14/2019  . Angiodysplasia of stomach and duodenum   . Iron deficiency anemia 04/20/2018  . Anemia, unspecified 04/12/2017  . Lichen sclerosus of female genitalia 06/29/2016  . Primary osteoarthritis of both knees 04/07/2016  . Special screening for malignant neoplasms, colon   . Right thyroid nodule 06/26/2015  . Asthma, intermittent 04/03/2015  . Benign essential HTN 04/03/2015  . Carpal tunnel syndrome 04/03/2015  . Chronic kidney disease (CKD), stage II (mild) 04/03/2015  . Controlled gout 04/03/2015  . Arteriosclerosis of coronary artery 04/03/2015  . Diabetes mellitus with renal manifestations, controlled ([place]) 04/03/2015  . Dyslipidemia 04/03/2015  . Edema extremities 04/03/2015  . Elevated sedimentation rate  04/03/2015  . Knee pain 04/03/2015  . Lumbar radiculitis 04/03/2015  . Obstructive apnea 04/03/2015  . Lumbosacral spondylosis 04/03/2015  . Osteoarthritis, chronic 04/03/2015  . Psoriasis 04/03/2015  . Vitamin D deficiency 04/03/2015  . Varicose veins 04/03/2015  . Goiter 12/23/2014  . Degeneration of intervertebral disc of lumbar region 08/23/2014  . Corns and callosity 06/11/2009    Past Surgical History:  Procedure Laterality Date  . APPENDECTOMY    .  CHOLECYSTECTOMY  1977  . COLONOSCOPY WITH PROPOFOL N/A 01/21/2016   Procedure: COLONOSCOPY WITH PROPOFOL;  Surgeon: Lucilla Lame, MD;  Location: [facility] ENDOSCOPY;  Service: Endoscopy;  Laterality: N/A;  . DILATION AND CURETTAGE OF UTERUS     Due to Amenorrhea  . ESOPHAGOGASTRODUODENOSCOPY (EGD) WITH PROPOFOL N/A 08/16/2018   Procedure: ESOPHAGOGASTRODUODENOSCOPY (EGD) WITH PROPOFOL;  Surgeon: Lucilla Lame, MD;  Location: [facility] ENDOSCOPY;  Service: Endoscopy;  Laterality: N/A;  . [place]  . HEMORROIDECTOMY    . HERNIA REPAIR  2011  . Temporal Area Excision Biopsy     For Birth Mark Changes, Negative Pathology  . TONSILLECTOMY AND ADENOIDECTOMY      Family History  Problem Relation Age of Onset  . Cancer Mother        brain tumor  . Kidney disease Mother   . Hypertension Mother   . Heart disease Father   . COPD Father   . Hypertension Sister   . Arthritis Sister   . Cancer Sister   . Heart murmur Sister   . GER disease Sister   . Lupus Sister   . Diabetes Sister   . Arthritis Sister   . Osteopenia Sister   . Heart disease Sister   . Hypertension Sister   . Breast cancer Maternal Aunt 70  . Leukemia Maternal Aunt     Social History   Tobacco Use  . Smoking status: Former Smoker    Packs/day: 2.00    Years: 15.00    Pack years: 30.00    Types: Cigarettes    Quit date: 1983    Years since quitting: 38.8  . Smokeless tobacco: Never Used  . Tobacco comment: smoking cessation materials not required  Substance Use Topics  . Alcohol use: Not Currently    Alcohol/week: 0.0 standard drinks     Current Outpatient Medications:  .  acetaminophen (TYLENOL) 500 MG tablet, Take 1 tablet (500 mg total) by mouth every 6 (six) hours as needed. (Patient taking differently: Take 1,000 mg by mouth every 8 (eight) hours. ), Disp: 480 tablet, Rfl: 0 .  albuterol (ACCUNEB) 1.25 MG/3ML nebulizer solution, Take 3 mLs (1.25 mg total) by nebulization 2 (two) times daily., Disp:  75 mL, Rfl: 2 .  allopurinol (ZYLOPRIM) 300 MG tablet, Take 1 tablet (300 mg total) by mouth daily., Disp: 90 tablet, Rfl: 3 .  atorvastatin (LIPITOR) 40 MG tablet, Daily for cholesterol, Disp: 90 tablet, Rfl: 3 .  benazepril (LOTENSIN) 20 MG tablet, Take 1 tablet by mouth once daily, Disp: 90 tablet, Rfl: 0 .  Blood Glucose Monitoring Suppl (TRUE METRIX AIR GLUCOSE METER) w/Device KIT, 1 each by Does not apply route 1 day or 1 dose. Checks Fasting Blood Sugar Twice daily Diabetes mellitus with renal manifestations, controlled ([place])      Code: E11.29, Disp: 1 kit, Rfl: 0 .  diclofenac Sodium (VOLTAREN) 1 % GEL, Apply 4 g topically 4 (four) times daily., Disp: 100 g, Rfl: 5 .  Dulaglutide (TRULICITY) 1.5 YB/6.3SL SOPN, Inject 1.5  mg into the skin once a week., Disp: 12 pen, Rfl: 3 .  furosemide (LASIX) 40 MG tablet, Take 1 tablet (40 mg total) by mouth daily., Disp: 90 tablet, Rfl: 3 .  gabapentin (NEURONTIN) 300 MG capsule, TAKE 1 CAPSULE BY MOUTH IN THE MORNING AND 1 IN THE EVENING AND 2 AT NIGHT, Disp: 360 capsule, Rfl: 3 .  glucose blood test strip, Use as instructed, Disp: 100 each, Rfl: 12 .  Insulin Pen Needle 32G X 6 MM MISC, 1 each by Does not apply route 4 (four) times daily., Disp: 200 each, Rfl: 2 .  Lancets (ONETOUCH ULTRASOFT) lancets, Use as instructed, Disp: 100 each, Rfl: 12 .  potassium chloride SA (KLOR-CON) 20 MEQ tablet, Take 1 tablet by mouth once daily, Disp: 90 tablet, Rfl: 0 .  Triamcinolone Acetonide (TRIAMCINOLONE 0.1 % CREAM : EUCERIN) CREA, Apply 1 application topically., Disp: , Rfl:  .  triamcinolone ointment (KENALOG) 0.1 %, Use BID prn, Disp: 90 g, Rfl: 1 .  Vitamin D, Cholecalciferol, 25 MCG (1000 UT) TABS, Take 1 capsule by mouth daily., Disp: 90 tablet, Rfl: 1 .  albuterol (VENTOLIN HFA) 108 (90 Base) MCG/ACT inhaler, Inhale 2 puffs into the lungs every 6 (six) hours as needed for wheezing or shortness of breath., Disp: 8 g, Rfl: 0 .  budesonide (PULMICORT) 0.25  MG/2ML nebulizer solution, Take 2 mLs (0.25 mg total) by nebulization 2 (two) times daily. (Patient not taking: Reported on 04/30/2020), Disp: 60 mL, Rfl: 2  Allergies  Allergen Reactions  . Codeine Hives and Nausea And Vomiting    I personally reviewed active problem list, medication list, allergies, family history, social history, health maintenance with the patient/caregiver today.   ROS  Constitutional: Negative for fever or weight change.  Respiratory: Negative for cough and shortness of breath.   Cardiovascular: Negative for chest pain or palpitations.  Gastrointestinal: Negative for abdominal pain, no bowel changes.  Musculoskeletal: Negative for gait problem or joint swelling.  Skin: Negative for rash.  Neurological: Negative for dizziness or headache.  No other specific complaints in a complete review of systems (except as listed in HPI above).  Objective  Vitals:   08/12/20 1048  BP: 100/70  Pulse: 95  Resp: 16  Temp: 97.8 F (36.6 C)  TempSrc: Oral  SpO2: 97%  Weight: 270 lb 6.4 oz (122.7 kg)  Height: _0  (1.575 m)    Body mass index is 49.46 kg/m.  Physical Exam  Constitutional: Patient appears well-developed and well-nourished. Obese  No distress.  HEENT: head atraumatic, normocephalic, pupils equal and reactive to light,  neck supple Cardiovascular: Normal rate, regular rhythm and normal heart sounds.  No murmur heard. Trace BLE edema. Pulmonary/Chest: Effort normal and breath sounds normal. No respiratory distress. Abdominal: Soft.  There is no tenderness. Psychiatric: Patient has a normal mood and affect. behavior is normal. Judgment and thought content normal.  Recent Results (from the past 2160 hour(s))  HM DIABETES EYE EXAM     Status: None   Collection Time: 06/04/20 12:00 AM  Result Value Ref Range   HM Diabetic Eye Exam No Retinopathy No Retinopathy  HM DIABETES EYE EXAM     Status: None   Collection Time: 06/11/20 12:00 AM  Result Value  Ref Range   HM Diabetic Eye Exam No Retinopathy No Retinopathy    Comment: [place] [facility]  POCT HgB A1C     Status: Abnormal   Collection Time: 08/12/20 10:59 AM  Result Value Ref Range  Hemoglobin A1C 6.8 (A) 4.0 - 5.6 %   HbA1c POC (<> result, manual entry)     HbA1c, POC (prediabetic range)     HbA1c, POC (controlled diabetic range)      Diabetic Foot Exam: Diabetic Foot Exam - Simple   Simple Foot Form Diabetic Foot exam was performed with the following findings: Yes 08/12/2020 11:16 AM  Visual Inspection See comments: Yes Sensation Testing Intact to touch and monofilament testing bilaterally: Yes Pulse Check Posterior Tibialis and Dorsalis pulse intact bilaterally: Yes Comments Thick toenails      PHQ2/9: Depression screen [facility] 2/9 08/12/2020 06/20/2020 04/12/2020 04/12/2020 12/14/2019  Decreased Interest 0 0 0 0 0  Down, Depressed, Hopeless 0 0 0 0 0  PHQ - 2 Score 0 0 0 0 0  Altered sleeping - - 0 0 0  Tired, decreased energy - - 0 0 0  Change in appetite - - 0 0 0  Feeling bad or failure about yourself  - - 0 0 0  Trouble concentrating - - 0 0 0  Moving slowly or fidgety/restless - - 0 0 0  Suicidal thoughts - - 0 0 0  PHQ-9 Score - - 0 0 0  Difficult doing work/chores - - - - -  Some recent data might be hidden    phq 9 is negative  Fall Risk: Fall Risk  08/12/2020 06/20/2020 04/12/2020 08/14/2019 06/20/2019  Falls in the past year? 0 0 0 0 0  Number falls in past yr: 0 0 0 0 0  Injury with Fall? 0 0 0 0 0  Risk for fall due to : - Impaired mobility;Impaired balance/gait - - -  Risk for fall due to: Comment - - - - -  Follow up - Falls prevention discussed - - -     Functional Status Survey: Is the patient deaf or have difficulty hearing?: No Does the patient have difficulty seeing, even when wearing glasses/contacts?: Yes Does the patient have difficulty concentrating, remembering, or making decisions?: No Does the patient have difficulty walking or climbing  stairs?: Yes Does the patient have difficulty dressing or bathing?: No Does the patient have difficulty doing errands alone such as visiting a doctor's office or shopping?: No    Assessment & Plan  1. Controlled type 2 diabetes mellitus with stage 3 chronic kidney disease, without long-term current use of insulin (HCC)  - POCT HgB A1C  2. Dog bite, initial encounter  - Tdap vaccine greater than or equal to 7yo IM  3. Dyslipidemia   4. B12 deficiency   5. Benign essential HTN   6. Need for Tdap vaccination  Today   7. Gait instability   8. Asthma, mild intermittent, well-controlled  - albuterol (VENTOLIN HFA) 108 (90 Base) MCG/ACT inhaler; Inhale 2 puffs into the lungs every 6 (six) hours as needed for wheezing or shortness of breath.  Dispense: 8 g; Refill: 0  9. Osteoarthritis, chronic   10. Senile purpura (HCC)  Stable   11. Bilateral primary osteoarthritis of knee   12. Stage 3a chronic kidney disease ([place])   13. Controlled gout   14. Morbid obesity with body mass index (BMI) of 40.0 or higher (HCC)  Discussed with the patient the risk posed by an increased BMI. Discussed importance of portion control, calorie counting and at least 150 minutes of physical activity weekly. Avoid sweet beverages and drink more water. Eat at least 6 servings of fruit and vegetables daily   15. Bilateral edema  of lower extremity  Stable

## 2020-08-12 NOTE — Progress Notes (Signed)
Chronic Care Management Pharmacy Assistant   Name: Amber Avery  MRN: [id number] DOB: Sep 22, 1946  Reason for Encounter: Medication Review  Patient Questions:  1.  Have you seen any other providers since your last visit? No  2.  Any changes in your medicines or health? No   Amber Avery,  74 y.o. , female presents for their Follow-Up CCM visit with the clinical pharmacist via telephone.  PCP : Steele Sizer, MD  Allergies:   Allergies  Allergen Reactions  . Codeine Hives and Nausea And Vomiting    Medications: Outpatient Encounter Medications as of 08/09/2020  Medication Sig Note  . acetaminophen (TYLENOL) 500 MG tablet Take 1 tablet (500 mg total) by mouth every 6 (six) hours as needed. (Patient taking differently: Take 1,000 mg by mouth every 8 (eight) hours. )   . albuterol (ACCUNEB) 1.25 MG/3ML nebulizer solution Take 3 mLs (1.25 mg total) by nebulization 2 (two) times daily. 10/20/2018: Patient has not needed to use nebulizer  . allopurinol (ZYLOPRIM) 300 MG tablet Take 1 tablet (300 mg total) by mouth daily.   [name] Kitchen atorvastatin (LIPITOR) 40 MG tablet Daily for cholesterol   . benazepril (LOTENSIN) 20 MG tablet Take 1 tablet by mouth once daily   . Blood Glucose Monitoring Suppl (TRUE METRIX AIR GLUCOSE METER) w/Device KIT 1 each by Does not apply route 1 day or 1 dose. Checks Fasting Blood Sugar Twice daily Diabetes mellitus with renal manifestations, controlled ([place])      Code: E11.29 06/20/2020: Pt using one touch verio meter  . budesonide (PULMICORT) 0.25 MG/2ML nebulizer solution Take 2 mLs (0.25 mg total) by nebulization 2 (two) times daily. (Patient not taking: Reported on 04/30/2020) 10/20/2018: Patient has not needed   . diclofenac Sodium (VOLTAREN) 1 % GEL Apply 4 g topically 4 (four) times daily.   . Dulaglutide (TRULICITY) 1.5 BW/6.2MB SOPN Inject 1.5 mg into the skin once a week.   . furosemide (LASIX) 40 MG tablet Take 1 tablet (40 mg total) by mouth daily.   [name] Kitchen  gabapentin (NEURONTIN) 300 MG capsule TAKE 1 CAPSULE BY MOUTH IN THE MORNING AND 1 IN THE EVENING AND 2 AT NIGHT   . glucose blood test strip Use as instructed 06/20/2020: One touch verio  . Insulin Pen Needle 32G X 6 MM MISC 1 each by Does not apply route 4 (four) times daily. 10/20/2018: Trouble with copay  . Lancets (ONETOUCH ULTRASOFT) lancets Use as instructed   . potassium chloride SA (KLOR-CON) 20 MEQ tablet Take 1 tablet by mouth once daily   . triamcinolone ointment (KENALOG) 0.1 % Use BID prn   . Vitamin D, Cholecalciferol, 25 MCG (1000 UT) TABS Take 1 capsule by mouth daily.   . [DISCONTINUED] potassium chloride SA (KLOR-CON) 20 MEQ tablet Take 1 tablet (20 mEq total) by mouth daily.   . [DISCONTINUED] vitamin B-12 (CYANOCOBALAMIN) 500 MCG tablet Take 500 mcg by mouth daily.    No facility-administered encounter medications on file as of 08/09/2020.    Current Diagnosis: Patient Active Problem List   Diagnosis Date Noted  . Cellulitis of right ankle 04/12/2020  . BMI 50.0-59.9, adult ([place]) 02/06/2020  . History of gastritis 08/14/2019  . Angiodysplasia of stomach and duodenum   . Iron deficiency anemia 04/20/2018  . Anemia, unspecified 04/12/2017  . Lichen sclerosus of female genitalia 06/29/2016  . Primary osteoarthritis of both knees 04/07/2016  . Special screening for malignant neoplasms, colon   . Right thyroid nodule  06/26/2015  . Asthma, intermittent 04/03/2015  . Benign essential HTN 04/03/2015  . Carpal tunnel syndrome 04/03/2015  . Chronic kidney disease (CKD), stage II (mild) 04/03/2015  . Controlled gout 04/03/2015  . Arteriosclerosis of coronary artery 04/03/2015  . Diabetes mellitus with renal manifestations, controlled ([place]) 04/03/2015  . Dyslipidemia 04/03/2015  . Edema extremities 04/03/2015  . Elevated sedimentation rate 04/03/2015  . Knee pain 04/03/2015  . Lumbar radiculitis 04/03/2015  . Obstructive apnea 04/03/2015  . Lumbosacral spondylosis  04/03/2015  . Osteoarthritis, chronic 04/03/2015  . Psoriasis 04/03/2015  . Vitamin D deficiency 04/03/2015  . Varicose veins 04/03/2015  . Goiter 12/23/2014  . Degeneration of intervertebral disc of lumbar region 08/23/2014  . Corns and callosity 06/11/2009    Goals Addressed   None     Follow-Up:  Medication Cost Review    Ran patients current prescriptions through medicare.gov to compare current pharmacy against [facility].  Pt is switching to [employer] plan at start of year so included them under insurance as well. [employer] considers [facility] standard pharmacy not a preferred pharmacy. Switch is not financially beneficial to patient. Pt made aware of findings. Explained difference in benefits to patient and what each finding means. Extensive timing given to in-depth explanation of how to maximize saving with drug benefits. te

## 2020-08-13 NOTE — Discharge Instructions (Signed)

## 2020-08-14 ENCOUNTER — Ambulatory Visit
Admission: RE | Admit: 2020-08-14 | Discharge: 2020-08-14 | Disposition: A | Discharge status: Home / Self Care | Payer: Medicare HMO | Attending: Ophthalmology | Admitting: Ophthalmology

## 2020-08-14 ENCOUNTER — Encounter
Admission: RE | Disposition: A | Discharge status: Home / Self Care | Payer: Self-pay | Source: Home / Self Care | Attending: Ophthalmology

## 2020-08-14 ENCOUNTER — Ambulatory Visit: Payer: Medicare HMO | Admitting: Anesthesiology

## 2020-08-14 ENCOUNTER — Encounter: Payer: Self-pay | Admitting: Ophthalmology

## 2020-08-14 ENCOUNTER — Other Ambulatory Visit: Payer: Self-pay

## 2020-08-14 DIAGNOSIS — E1136 Type 2 diabetes mellitus with diabetic cataract: Secondary | ICD-10-CM | POA: Insufficient documentation

## 2020-08-14 DIAGNOSIS — Z87891 Personal history of nicotine dependence: Secondary | ICD-10-CM | POA: Insufficient documentation

## 2020-08-14 DIAGNOSIS — H2512 Age-related nuclear cataract, left eye: Secondary | ICD-10-CM | POA: Insufficient documentation

## 2020-08-14 DIAGNOSIS — H25812 Combined forms of age-related cataract, left eye: Secondary | ICD-10-CM | POA: Diagnosis not present

## 2020-08-14 DIAGNOSIS — Z885 Allergy status to narcotic agent status: Secondary | ICD-10-CM | POA: Diagnosis not present

## 2020-08-14 HISTORY — PX: CATARACT EXTRACTION W/PHACO: SHX586

## 2020-08-14 LAB — GLUCOSE, CAPILLARY
Glucose-Capillary: 124 mg/dL — ABNORMAL HIGH (ref 70–99)
Glucose-Capillary: 113 mg/dL — ABNORMAL HIGH (ref 70–99)

## 2020-08-14 SURGERY — CATARACT EXTRACTION PHACO AND INTRAOCULAR LENS PLACEMENT (IOC)
Anesthesia: Monitor Anesthesia Care | Site: Eye | Laterality: Left

## 2020-08-14 MED ORDER — ARMC OPHTHALMIC DILATING DROPS
1.0000 "application " | OPHTHALMIC | Status: DC | PRN
  Administered 2020-08-14 (×3): 1 "application " via OPHTHALMIC

## 2020-08-14 MED ORDER — NA HYALUR & NA CHOND-NA HYALUR 0.4-0.35 ML IO KIT
PACK | INTRAOCULAR | Status: DC | PRN
  Administered 2020-08-14: 1 mL via INTRAOCULAR

## 2020-08-14 MED ORDER — MIDAZOLAM HCL 2 MG/2ML IJ SOLN
INTRAMUSCULAR | Status: DC | PRN
  Administered 2020-08-14: 2 mg via INTRAVENOUS

## 2020-08-14 MED ORDER — CEFUROXIME OPHTHALMIC INJECTION 1 MG/0.1 ML
INJECTION | OPHTHALMIC | Status: DC | PRN
  Administered 2020-08-14: 0.1 mL via INTRACAMERAL

## 2020-08-14 MED ORDER — TETRACAINE HCL 0.5 % OP SOLN
1.0000 [drp] | OPHTHALMIC | Status: DC | PRN
  Administered 2020-08-14 (×3): 1 [drp] via OPHTHALMIC

## 2020-08-14 MED ORDER — BRIMONIDINE TARTRATE-TIMOLOL 0.2-0.5 % OP SOLN
OPHTHALMIC | Status: DC | PRN
  Administered 2020-08-14: 1 [drp] via OPHTHALMIC

## 2020-08-14 MED ORDER — FENTANYL CITRATE (PF) 100 MCG/2ML IJ SOLN
INTRAMUSCULAR | Status: DC | PRN
  Administered 2020-08-14 (×2): 50 ug via INTRAVENOUS

## 2020-08-14 MED ORDER — LACTATED RINGERS IV SOLN: INTRAVENOUS | Status: DC

## 2020-08-14 MED ORDER — MOXIFLOXACIN HCL 0.5 % OP SOLN
1.0000 [drp] | OPHTHALMIC | Status: DC | PRN
  Administered 2020-08-14 (×3): 1 [drp] via OPHTHALMIC

## 2020-08-14 MED ORDER — LIDOCAINE HCL (PF) 2 % IJ SOLN
INTRAOCULAR | Status: DC | PRN
  Administered 2020-08-14: 2 mL

## 2020-08-14 MED ORDER — ACETAMINOPHEN 160 MG/5ML PO SOLN: 325.0000 mg | ORAL | Status: DC | PRN

## 2020-08-14 MED ORDER — EPINEPHRINE PF 1 MG/ML IJ SOLN
INTRAOCULAR | Status: DC | PRN
  Administered 2020-08-14: 70 mL via OPHTHALMIC

## 2020-08-14 MED ORDER — ACETAMINOPHEN 325 MG PO TABS: 325.0000 mg | ORAL_TABLET | ORAL | Status: DC | PRN

## 2020-08-14 SURGICAL SUPPLY — 21 items
CANNULA ANT/CHMB 27GA (MISCELLANEOUS) ×2 IMPLANT
GLOVE SURG LX 7.5 STRW (GLOVE) ×1
GLOVE SURG LX STRL 7.5 STRW (GLOVE) ×1
GLOVE SURG TRIUMPH 8.0 PF LTX (GLOVE) ×2
GOWN STRL REUS W/ TWL LRG LVL3 (GOWN DISPOSABLE) ×2
GOWN STRL REUS W/TWL LRG LVL3 (GOWN DISPOSABLE) ×4
LENS IOL DIOP 23.5 (Intraocular Lens) ×2 IMPLANT
MARKER SKIN DUAL TIP RULER LAB (MISCELLANEOUS) ×2
NDL CAPSULORHEX 25GA (NEEDLE) ×1 IMPLANT
NDL FILTER BLUNT 18X1 1/2 (NEEDLE) ×2 IMPLANT
NEEDLE CAPSULORHEX 25GA (NEEDLE) ×2
NEEDLE FILTER BLUNT 18X 1/2SAF (NEEDLE) ×2
NEEDLE FILTER BLUNT 18X1 1/2 (NEEDLE) ×2
PACK CATARACT BRASINGTON (MISCELLANEOUS) ×2
PACK EYE AFTER SURG (MISCELLANEOUS) ×2
PACK OPTHALMIC (MISCELLANEOUS) ×2
SOLUTION OPHTHALMIC SALT (MISCELLANEOUS) ×2
SYR 3ML LL SCALE MARK (SYRINGE) ×4
SYR TB 1ML LUER SLIP (SYRINGE) ×2
WATER STERILE IRR 250ML POUR (IV SOLUTION) ×2
WIPE NON LINTING 3.25X3.25 (MISCELLANEOUS) ×2

## 2020-08-14 NOTE — Anesthesia Postprocedure Evaluation (Signed)
Anesthesia Post Note  Patient: Amber Ridolfi Avery  Procedure(s) Performed: CATARACT EXTRACTION PHACO AND INTRAOCULAR LENS PLACEMENT (IOC) LEFT DIABETIC 3.56 00:59.6 6.0% (Left Eye)     Patient location during evaluation: PACU Anesthesia Type: MAC Level of consciousness: awake and alert Pain management: pain level controlled Vital Signs Assessment: post-procedure vital signs reviewed and stable Respiratory status: spontaneous breathing, nonlabored ventilation, respiratory function stable and patient connected to nasal cannula oxygen Cardiovascular status: stable and blood pressure returned to baseline Postop Assessment: no apparent nausea or vomiting Anesthetic complications: no   No complications documented.  Wanda Plump Nashalie Sallis

## 2020-08-14 NOTE — Anesthesia Preprocedure Evaluation (Signed)
Anesthesia Evaluation    History of Anesthesia Complications (+) PONV and history of anesthetic complications  Airway Mallampati: II  TM Distance: >3 FB Neck ROM: Limited    Dental   Pulmonary former smoker,    Pulmonary exam normal        Cardiovascular hypertension, Normal cardiovascular exam     Neuro/Psych    GI/Hepatic   Endo/Other  diabetes  Renal/GU CRFRenal disease     Musculoskeletal  (+) Arthritis ,   Abdominal   Peds  Hematology   Anesthesia Other Findings   Reproductive/Obstetrics                             Anesthesia Physical Anesthesia Plan  ASA: II  Anesthesia Plan: MAC   Post-op Pain Management:    Induction: Intravenous  PONV Risk Score and Plan:   Airway Management Planned: Nasal Cannula and Natural Airway  Additional Equipment:   Intra-op Plan:   Post-operative Plan:   Informed Consent: I have reviewed the patients History and Physical, chart, labs and discussed the procedure including the risks, benefits and alternatives for the proposed anesthesia with the patient or authorized representative who has indicated his/her understanding and acceptance.       Plan Discussed with:   Anesthesia Plan Comments:         Anesthesia Quick Evaluation

## 2020-08-14 NOTE — Anesthesia Procedure Notes (Signed)
Procedure Name: MAC Date/Time: 08/14/2020 7:49 AM Performed by: Jeannene Patella, CRNA Pre-anesthesia Checklist: Patient identified, Emergency Drugs available, Suction available, Timeout performed and Patient being monitored Patient Re-evaluated:Patient Re-evaluated prior to induction Oxygen Delivery Method: Nasal cannula Placement Confirmation: positive ETCO2

## 2020-08-14 NOTE — Op Note (Signed)
OPERATIVE NOTE  Amber Avery [id number] 08/14/2020   PREOPERATIVE DIAGNOSIS:  Nuclear sclerotic cataract left eye. H25.12   POSTOPERATIVE DIAGNOSIS:    Nuclear sclerotic cataract left eye.     PROCEDURE:  Phacoemusification with posterior chamber intraocular lens placement of the left eye  Ultrasound time: Procedure(s) with comments: CATARACT EXTRACTION PHACO AND INTRAOCULAR LENS PLACEMENT (IOC) LEFT DIABETIC 3.56 00:59.6 6.0% (Left) - Diabetic  LENS:   Implant Name Type Inv. Item Serial No. Manufacturer Lot No. LRB No. Used Action  LENS IOL DIOP 23.5 - [id number] Intraocular Lens LENS IOL DIOP 23.5 [id number] [id number]   Left 1 Implanted      SURGEON:  Wyonia Hough, MD   ANESTHESIA:  Topical with tetracaine drops and 2% Xylocaine jelly, augmented with 1% preservative-free intracameral lidocaine.    COMPLICATIONS:  None.   DESCRIPTION OF PROCEDURE:  The patient was identified in the holding room and transported to the operating room and placed in the supine position under the operating microscope.  The left eye was identified as the operative eye and it was prepped and draped in the usual sterile ophthalmic fashion.   A 1 millimeter clear-corneal paracentesis was made at the 1:30 position.  0.5 ml of preservative-free 1% lidocaine was injected into the anterior chamber.  The anterior chamber was filled with Viscoat viscoelastic.  A 2.4 millimeter keratome was used to make a near-clear corneal incision at the 10:30 position.  .  A curvilinear capsulorrhexis was made with a cystotome and capsulorrhexis forceps.  Balanced salt solution was used to hydrodissect and hydrodelineate the nucleus.   Phacoemulsification was then used in stop and chop fashion to remove the lens nucleus and epinucleus.  The remaining cortex was then removed using the irrigation and aspiration handpiece. Provisc was then placed into the capsular bag to distend it for lens placement.  A lens was then  injected into the capsular bag.  The remaining viscoelastic was aspirated.   Wounds were hydrated with balanced salt solution.  The anterior chamber was inflated to a physiologic pressure with balanced salt solution.  No wound leaks were noted. Cefuroxime 0.1 ml of a 10mg /ml solution was injected into the anterior chamber for a dose of 1 mg of intracameral antibiotic at the completion of the case.   Timolol and Brimonidine drops were applied to the eye.  The patient was taken to the recovery room in stable condition without complications of anesthesia or surgery.  Amber Avery 08/14/2020, 8:05 AM

## 2020-08-14 NOTE — Transfer of Care (Signed)
Immediate Anesthesia Transfer of Care Note  Patient: Amber Avery  Procedure(s) Performed: CATARACT EXTRACTION PHACO AND INTRAOCULAR LENS PLACEMENT (IOC) LEFT DIABETIC 3.56 00:59.6 6.0% (Left Eye)  Patient Location: PACU  Anesthesia Type: MAC  Level of Consciousness: awake, alert  and patient cooperative  Airway and Oxygen Therapy: Patient Spontanous Breathing and Patient connected to supplemental oxygen  Post-op Assessment: Post-op Vital signs reviewed, Patient's Cardiovascular Status Stable, Respiratory Function Stable, Patent Airway and No signs of Nausea or vomiting  Post-op Vital Signs: Reviewed and stable  Complications: No complications documented.

## 2020-08-14 NOTE — H&P (Signed)

## 2020-08-15 ENCOUNTER — Encounter: Payer: Self-pay | Admitting: Ophthalmology

## 2020-08-16 DIAGNOSIS — J449 Chronic obstructive pulmonary disease, unspecified: Secondary | ICD-10-CM | POA: Diagnosis not present

## 2020-08-22 ENCOUNTER — Telehealth: Payer: Self-pay

## 2020-08-22 NOTE — Progress Notes (Signed)
  Chronic Care Management Pharmacy Assistant   Name: Amber Avery  MRN: [id number] DOB: 04/24/1946  Reason for Encounter: Medication review  Patient Questions:  1.  Have you seen any other providers since your last visit? Yes, Amber Avery   2.  Any changes in your medicines or health? Yes, Cataract extraction   Amber Avery,  74 y.o. , female presents for their Follow-Up CCM visit with the clinical pharmacist via telephone.  PCP : Avery, Krichna, MD  Allergies:   Allergies  Allergen Reactions  . Codeine Hives and Nausea And Vomiting    Medications: Outpatient Encounter Medications as of 08/22/2020  Medication Sig Note  . acetaminophen (TYLENOL) 500 MG tablet Take 1 tablet (500 mg total) by mouth every 6 (six) hours as needed. (Patient taking differently: Take 1,000 mg by mouth every 8 (eight) hours. )   . albuterol (ACCUNEB) 1.25 MG/3ML nebulizer solution Take 3 mLs (1.25 mg total) by nebulization 2 (two) times daily. 10/20/2018: Patient has not needed to use nebulizer  . albuterol (VENTOLIN HFA) 108 (90 Base) MCG/ACT inhaler Inhale 2 puffs into the lungs every 6 (six) hours as needed for wheezing or shortness of breath. (Patient not taking: Reported on 08/14/2020)   . allopurinol (ZYLOPRIM) 300 MG tablet Take 1 tablet (300 mg total) by mouth daily.   . atorvastatin (LIPITOR) 40 MG tablet Daily for cholesterol   . benazepril (LOTENSIN) 20 MG tablet Take 1 tablet by mouth once daily   . Blood Glucose Monitoring Suppl (TRUE METRIX AIR GLUCOSE METER) w/Device KIT 1 each by Does not apply route 1 day or 1 dose. Checks Fasting Blood Sugar Twice daily Diabetes mellitus with renal manifestations, controlled (HCC)      Code: E11.29 06/20/2020: Pt using one touch verio meter  . budesonide (PULMICORT) 0.25 MG/2ML nebulizer solution Take 2 mLs (0.25 mg total) by nebulization 2 (two) times daily. (Patient not taking: Reported on 04/30/2020) 10/20/2018: Patient has not  needed   . diclofenac Sodium (VOLTAREN) 1 % GEL Apply 4 g topically 4 (four) times daily.   . Dulaglutide (TRULICITY) 1.5 MG/0.5ML SOPN Inject 1.5 mg into the skin once a week.   . furosemide (LASIX) 40 MG tablet Take 1 tablet (40 mg total) by mouth daily.   . gabapentin (NEURONTIN) 300 MG capsule TAKE 1 CAPSULE BY MOUTH IN THE MORNING AND 1 IN THE EVENING AND 2 AT NIGHT   . glucose blood test strip Use as instructed 06/20/2020: One touch verio  . Insulin Pen Needle 32G X 6 MM MISC 1 each by Does not apply route 4 (four) times daily. 10/20/2018: Trouble with copay  . Lancets (ONETOUCH ULTRASOFT) lancets Use as instructed   . potassium chloride SA (KLOR-CON) 20 MEQ tablet Take 1 tablet by mouth once daily   . Triamcinolone Acetonide (TRIAMCINOLONE 0.1 % CREAM : EUCERIN) CREA Apply 1 application topically.   . triamcinolone ointment (KENALOG) 0.1 % Use BID prn   . Vitamin D, Cholecalciferol, 25 MCG (1000 UT) TABS Take 1 capsule by mouth daily.   . [DISCONTINUED] potassium chloride SA (KLOR-CON) 20 MEQ tablet Take 1 tablet (20 mEq total) by mouth daily.    No facility-administered encounter medications on file as of 08/22/2020.    Current Diagnosis: Patient Assistance Coordination   Follow-Up:  Patient Assistance Coordination Mailed application for Trulicity patient assistance to patient. Patient to complete and return it and any needed supporting documents for so that Avery, Krichna, MD can   provide prescription for application. Patient may contact me if any questions, phone number provided.

## 2020-08-23 DIAGNOSIS — I10 Essential (primary) hypertension: Secondary | ICD-10-CM | POA: Diagnosis not present

## 2020-08-23 DIAGNOSIS — H2511 Age-related nuclear cataract, right eye: Secondary | ICD-10-CM | POA: Diagnosis not present

## 2020-08-27 ENCOUNTER — Encounter: Payer: Self-pay | Admitting: Ophthalmology

## 2020-08-29 ENCOUNTER — Other Ambulatory Visit: Payer: Self-pay | Admitting: Family Medicine

## 2020-08-29 DIAGNOSIS — R600 Localized edema: Secondary | ICD-10-CM

## 2020-08-29 NOTE — Telephone Encounter (Signed)
Requested medication (s) are due for refill today: Yes  Requested medication (s) are on the active medication list: Yes  Last refill:  08/14/19  Future visit scheduled: Yes  Notes to clinic:  Unable to refill per protocol, expired Rx     Requested Prescriptions  Pending Prescriptions Disp Refills   furosemide (LASIX) 40 MG tablet [Pharmacy Med Name: Furosemide 40 MG Oral Tablet] 90 tablet 0    Sig: Take 1 tablet by mouth once daily      Cardiovascular:  Diuretics - Loop Failed - 08/29/2020 11:50 AM      Failed - Cr in normal range and within 360 days    Creat  Date Value Ref Range Status  12/14/2019 1.16 (H) 0.60 - 0.93 mg/dL Final    Comment:    For patients >28 years of age, the reference limit for Creatinine is approximately 13% higher for people identified as African-American. .    Creatinine, Urine  Date Value Ref Range Status  12/14/2019 23 20 - 275 mg/dL Final          Passed - K in normal range and within 360 days    Potassium  Date Value Ref Range Status  12/14/2019 4.8 3.5 - 5.3 mmol/L Final          Passed - Ca in normal range and within 360 days    Calcium  Date Value Ref Range Status  12/14/2019 9.3 8.6 - 10.4 mg/dL Final          Passed - Na in normal range and within 360 days    Sodium  Date Value Ref Range Status  12/14/2019 140 135 - 146 mmol/L Final  07/31/2016 142 134 - 144 mmol/L Final          Passed - Last BP in normal range    BP Readings from Last 1 Encounters:  08/14/20 (!) 102/52          Passed - Valid encounter within last 6 months    Recent Outpatient Visits           2 weeks ago Controlled type 2 diabetes mellitus with stage 3 chronic kidney disease, without long-term current use of insulin ([place])   [facility] [place] [place], Drue Stager, MD   4 months ago Controlled type 2 diabetes mellitus with stage 3 chronic kidney disease, without long-term current use of insulin [facility])   [facility]  [place], Drue Stager, MD   8 months ago Controlled type 2 diabetes mellitus with stage 3 chronic kidney disease, without long-term current use of insulin [facility])   [facility] [place], Drue Stager, MD   1 year ago Controlled type 2 diabetes mellitus with stage 3 chronic kidney disease, without long-term current use of insulin [facility])   [facility] [place], Drue Stager, MD   1 year ago Controlled type 2 diabetes mellitus with stage 3 chronic kidney disease, without long-term current use of insulin [facility])   [facility] Steele Sizer, MD       Future Appointments             In 3 months Ancil Boozer, Drue Stager, MD [facility], [place]   In 9 months  [facility], [facility]

## 2020-09-02 ENCOUNTER — Other Ambulatory Visit: Payer: Self-pay

## 2020-09-02 ENCOUNTER — Other Ambulatory Visit
Admission: RE | Admit: 2020-09-02 | Discharge: 2020-09-02 | Disposition: A | Discharge status: Home / Self Care | Payer: Medicare HMO | Source: Ambulatory Visit | Attending: Ophthalmology | Admitting: Ophthalmology

## 2020-09-02 ENCOUNTER — Telehealth: Payer: Self-pay

## 2020-09-02 DIAGNOSIS — Z01812 Encounter for preprocedural laboratory examination: Secondary | ICD-10-CM | POA: Diagnosis present

## 2020-09-02 DIAGNOSIS — Z20822 Contact with and (suspected) exposure to covid-19: Secondary | ICD-10-CM | POA: Diagnosis not present

## 2020-09-02 NOTE — Progress Notes (Signed)
Pt called to request that when sending in her PAP renewal she be removed from autofill. Appropriate box checked on renewal paper.

## 2020-09-02 NOTE — Discharge Instructions (Signed)

## 2020-09-03 ENCOUNTER — Other Ambulatory Visit: Payer: Self-pay | Admitting: Family Medicine

## 2020-09-03 DIAGNOSIS — R69 Illness, unspecified: Secondary | ICD-10-CM | POA: Diagnosis not present

## 2020-09-03 LAB — SARS CORONAVIRUS 2 (TAT 6-24 HRS): SARS Coronavirus 2: NEGATIVE

## 2020-09-03 NOTE — Telephone Encounter (Signed)
Requested medication (s) are due for refill today: Yes  Requested medication (s) are on the active medication list: Yes  Last refill:  08/14/19  Future visit scheduled: Yes  Notes to clinic:  Prescription has expired.    Requested Prescriptions  Pending Prescriptions Disp Refills   ONETOUCH VERIO test strip [Pharmacy Med Name: OneTouch Verio In Vitro Strip] 26 each 0    Sig: USE AS DIRECTED      Endocrinology: Diabetes - Testing Supplies Passed - 09/03/2020 10:57 AM      Passed - Valid encounter within last 12 months    Recent Outpatient Visits           3 weeks ago Controlled type 2 diabetes mellitus with stage 3 chronic kidney disease, without long-term current use of insulin ([place])   [facility] [place], Drue Stager, MD   4 months ago Controlled type 2 diabetes mellitus with stage 3 chronic kidney disease, without long-term current use of insulin [facility])   [facility] [place], Drue Stager, MD   8 months ago Controlled type 2 diabetes mellitus with stage 3 chronic kidney disease, without long-term current use of insulin [facility])   [facility] [place], Drue Stager, MD   1 year ago Controlled type 2 diabetes mellitus with stage 3 chronic kidney disease, without long-term current use of insulin [facility])   [facility] [place], Drue Stager, MD   1 year ago Controlled type 2 diabetes mellitus with stage 3 chronic kidney disease, without long-term current use of insulin [facility])   [facility] Steele Sizer, MD       Future Appointments             In 1 week Steele Sizer, MD [facility], [place]   In 3 months Steele Sizer, MD [facility], [place]   In 9 months  [facility], [facility]

## 2020-09-04 ENCOUNTER — Encounter
Admission: RE | Disposition: A | Discharge status: Home / Self Care | Payer: Self-pay | Source: Home / Self Care | Attending: Ophthalmology

## 2020-09-04 ENCOUNTER — Ambulatory Visit
Admission: RE | Admit: 2020-09-04 | Discharge: 2020-09-04 | Disposition: A | Discharge status: Home / Self Care | Payer: Medicare HMO | Attending: Ophthalmology | Admitting: Ophthalmology

## 2020-09-04 ENCOUNTER — Ambulatory Visit: Payer: Medicare HMO | Admitting: Anesthesiology

## 2020-09-04 DIAGNOSIS — H2511 Age-related nuclear cataract, right eye: Secondary | ICD-10-CM | POA: Insufficient documentation

## 2020-09-04 DIAGNOSIS — Z79899 Other long term (current) drug therapy: Secondary | ICD-10-CM | POA: Insufficient documentation

## 2020-09-04 DIAGNOSIS — Z885 Allergy status to narcotic agent status: Secondary | ICD-10-CM | POA: Diagnosis not present

## 2020-09-04 DIAGNOSIS — E1136 Type 2 diabetes mellitus with diabetic cataract: Secondary | ICD-10-CM | POA: Diagnosis present

## 2020-09-04 DIAGNOSIS — Z87891 Personal history of nicotine dependence: Secondary | ICD-10-CM | POA: Insufficient documentation

## 2020-09-04 DIAGNOSIS — H25811 Combined forms of age-related cataract, right eye: Secondary | ICD-10-CM | POA: Diagnosis not present

## 2020-09-04 HISTORY — PX: CATARACT EXTRACTION W/PHACO: SHX586

## 2020-09-04 LAB — GLUCOSE, CAPILLARY
Glucose-Capillary: 122 mg/dL — ABNORMAL HIGH (ref 70–99)
Glucose-Capillary: 121 mg/dL — ABNORMAL HIGH (ref 70–99)

## 2020-09-04 SURGERY — CATARACT EXTRACTION PHACO AND INTRAOCULAR LENS PLACEMENT (IOC)
Anesthesia: Monitor Anesthesia Care | Site: Eye | Laterality: Right

## 2020-09-04 MED ORDER — ACETAMINOPHEN 325 MG PO TABS: 325.0000 mg | ORAL_TABLET | Freq: Once | ORAL | Status: DC

## 2020-09-04 MED ORDER — LIDOCAINE HCL (PF) 2 % IJ SOLN
INTRAOCULAR | Status: DC | PRN
  Administered 2020-09-04: 1 mL

## 2020-09-04 MED ORDER — LACTATED RINGERS IV SOLN: INTRAVENOUS | Status: DC

## 2020-09-04 MED ORDER — ACETAMINOPHEN 160 MG/5ML PO SOLN: 325.0000 mg | Freq: Once | ORAL | Status: DC

## 2020-09-04 MED ORDER — FENTANYL CITRATE (PF) 100 MCG/2ML IJ SOLN
INTRAMUSCULAR | Status: DC | PRN
  Administered 2020-09-04: 50 ug via INTRAVENOUS

## 2020-09-04 MED ORDER — NA HYALUR & NA CHOND-NA HYALUR 0.4-0.35 ML IO KIT
PACK | INTRAOCULAR | Status: DC | PRN
  Administered 2020-09-04: 1 mL via INTRAOCULAR

## 2020-09-04 MED ORDER — MOXIFLOXACIN HCL 0.5 % OP SOLN
1.0000 [drp] | OPHTHALMIC | Status: DC | PRN
  Administered 2020-09-04 (×3): 1 [drp] via OPHTHALMIC

## 2020-09-04 MED ORDER — ARMC OPHTHALMIC DILATING DROPS
1.0000 "application " | OPHTHALMIC | Status: DC | PRN
  Administered 2020-09-04 (×3): 1 "application " via OPHTHALMIC

## 2020-09-04 MED ORDER — BRIMONIDINE TARTRATE-TIMOLOL 0.2-0.5 % OP SOLN
OPHTHALMIC | Status: DC | PRN
  Administered 2020-09-04: 1 [drp] via OPHTHALMIC

## 2020-09-04 MED ORDER — CEFUROXIME OPHTHALMIC INJECTION 1 MG/0.1 ML
INJECTION | OPHTHALMIC | Status: DC | PRN
  Administered 2020-09-04: 0.1 mL via INTRACAMERAL

## 2020-09-04 MED ORDER — EPINEPHRINE PF 1 MG/ML IJ SOLN
INTRAOCULAR | Status: DC | PRN
  Administered 2020-09-04: 75 mL via OPHTHALMIC

## 2020-09-04 MED ORDER — TETRACAINE HCL 0.5 % OP SOLN
1.0000 [drp] | OPHTHALMIC | Status: DC | PRN
  Administered 2020-09-04 (×3): 1 [drp] via OPHTHALMIC

## 2020-09-04 MED ORDER — MIDAZOLAM HCL 2 MG/2ML IJ SOLN
INTRAMUSCULAR | Status: DC | PRN
  Administered 2020-09-04: 2 mg via INTRAVENOUS

## 2020-09-04 SURGICAL SUPPLY — 26 items
CANNULA ANT/CHMB 27GA (MISCELLANEOUS) ×2 IMPLANT
GLOVE SURG LX 7.5 STRW (GLOVE) ×2
GLOVE SURG LX STRL 7.5 STRW (GLOVE) ×2
GLOVE SURG TRIUMPH 8.0 PF LTX (GLOVE) ×2
GOWN STRL REUS W/ TWL LRG LVL3 (GOWN DISPOSABLE) ×2
GOWN STRL REUS W/TWL LRG LVL3 (GOWN DISPOSABLE) ×4
LENS IOL DIOP 22.0 (Intraocular Lens) ×2 IMPLANT
MARKER SKIN DUAL TIP RULER LAB (MISCELLANEOUS) ×2
NDL CAPSULORHEX 25GA (NEEDLE) ×1 IMPLANT
NDL FILTER BLUNT 18X1 1/2 (NEEDLE) ×2 IMPLANT
NDL RETROBULBAR .5 NSTRL (NEEDLE)
NEEDLE CAPSULORHEX 25GA (NEEDLE) ×2
NEEDLE FILTER BLUNT 18X 1/2SAF (NEEDLE) ×2
NEEDLE FILTER BLUNT 18X1 1/2 (NEEDLE) ×2
PACK CATARACT BRASINGTON (MISCELLANEOUS) ×2
PACK EYE AFTER SURG (MISCELLANEOUS) ×2
PACK OPTHALMIC (MISCELLANEOUS) ×2
RING MALYGIN 7.0 (MISCELLANEOUS)
SOLUTION OPHTHALMIC SALT (MISCELLANEOUS) ×2
SUT ETHILON 10-0 CS-B-6CS-B-6 (SUTURE)
SUT VICRYL  9 0 (SUTURE)
SUT VICRYL 9 0 (SUTURE)
SYR 3ML LL SCALE MARK (SYRINGE) ×4
SYR TB 1ML LUER SLIP (SYRINGE) ×2
WATER STERILE IRR 250ML POUR (IV SOLUTION) ×2
WIPE NON LINTING 3.25X3.25 (MISCELLANEOUS) ×2

## 2020-09-04 NOTE — H&P (Signed)

## 2020-09-04 NOTE — Anesthesia Postprocedure Evaluation (Signed)
Anesthesia Post Note  Patient: Amber Avery  Procedure(s) Performed: CATARACT EXTRACTION PHACO AND INTRAOCULAR LENS PLACEMENT (IOC) RIGHT DIABETIC (Right Eye)     Patient location during evaluation: PACU Anesthesia Type: MAC Level of consciousness: awake and alert and oriented Pain management: satisfactory to patient Vital Signs Assessment: post-procedure vital signs reviewed and stable Respiratory status: spontaneous breathing, nonlabored ventilation and respiratory function stable Cardiovascular status: blood pressure returned to baseline and stable Postop Assessment: Adequate PO intake and No signs of nausea or vomiting Anesthetic complications: no   No complications documented.  Raliegh Ip

## 2020-09-04 NOTE — Anesthesia Procedure Notes (Signed)
Procedure Name: MAC Date/Time: 09/04/2020 8:16 AM Performed by: Silvana Newness, CRNA Pre-anesthesia Checklist: Patient identified, Emergency Drugs available, Suction available, Patient being monitored and Timeout performed Patient Re-evaluated:Patient Re-evaluated prior to induction Oxygen Delivery Method: Nasal cannula Placement Confirmation: positive ETCO2

## 2020-09-04 NOTE — Transfer of Care (Signed)
Immediate Anesthesia Transfer of Care Note  Patient: Amber Avery  Procedure(s) Performed: CATARACT EXTRACTION PHACO AND INTRAOCULAR LENS PLACEMENT (IOC) RIGHT DIABETIC (Right Eye)  Patient Location: PACU  Anesthesia Type: MAC  Level of Consciousness: awake, alert  and patient cooperative  Airway and Oxygen Therapy: Patient Spontanous Breathing and Patient connected to supplemental oxygen  Post-op Assessment: Post-op Vital signs reviewed, Patient's Cardiovascular Status Stable, Respiratory Function Stable, Patent Airway and No signs of Nausea or vomiting  Post-op Vital Signs: Reviewed and stable  Complications: No complications documented.

## 2020-09-04 NOTE — Op Note (Signed)
LOCATION:  [place]   PREOPERATIVE DIAGNOSIS:    Nuclear sclerotic cataract right eye. H25.11   POSTOPERATIVE DIAGNOSIS:  Nuclear sclerotic cataract right eye.     PROCEDURE:  Phacoemusification with posterior chamber intraocular lens placement of the right eye   ULTRASOUND TIME: Procedure(s) with comments: CATARACT EXTRACTION PHACO AND INTRAOCULAR LENS PLACEMENT (IOC) RIGHT DIABETIC (Right) - 4.72 1:03.2 7.4%  LENS:   Implant Name Type Inv. Item Serial No. Manufacturer Lot No. LRB No. Used Action  LENS IOL DIOP 22.0 - [id number] Intraocular Lens LENS IOL DIOP 22.0 [id number] [id number]   Right 1 Implanted         SURGEON:  Wyonia Hough, MD   ANESTHESIA:  Topical with tetracaine drops and 2% Xylocaine jelly, augmented with 1% preservative-free intracameral lidocaine.    COMPLICATIONS:  None.   DESCRIPTION OF PROCEDURE:  The patient was identified in the holding room and transported to the operating room and placed in the supine position under the operating microscope.  The right eye was identified as the operative eye and it was prepped and draped in the usual sterile ophthalmic fashion.   A 1 millimeter clear-corneal paracentesis was made at the 12:00 position.  0.5 ml of preservative-free 1% lidocaine was injected into the anterior chamber. The anterior chamber was filled with Viscoat viscoelastic.  A 2.4 millimeter keratome was used to make a near-clear corneal incision at the 9:00 position.  A curvilinear capsulorrhexis was made with a cystotome and capsulorrhexis forceps.  Balanced salt solution was used to hydrodissect and hydrodelineate the nucleus.   Phacoemulsification was then used in stop and chop fashion to remove the lens nucleus and epinucleus.  The remaining cortex was then removed using the irrigation and aspiration handpiece. Provisc was then placed into the capsular bag to distend it for lens placement.  A lens was then injected into the capsular  bag.  The remaining viscoelastic was aspirated.   Wounds were hydrated with balanced salt solution.  The anterior chamber was inflated to a physiologic pressure with balanced salt solution.  No wound leaks were noted. Cefuroxime 0.1 ml of a 10mg /ml solution was injected into the anterior chamber for a dose of 1 mg of intracameral antibiotic at the completion of the case.   Timolol and Brimonidine drops were applied to the eye.  The patient was taken to the recovery room in stable condition without complications of anesthesia or surgery.   Vianne Grieshop 09/04/2020, 8:32 AM

## 2020-09-04 NOTE — Anesthesia Preprocedure Evaluation (Signed)
Anesthesia Evaluation  Patient identified by MRN, date of birth, ID band Patient awake    Reviewed: Allergy & Precautions, H&P , NPO status , Patient's Chart, lab work & pertinent test results  History of Anesthesia Complications (+) PONV and history of anesthetic complications  Airway Mallampati: II  TM Distance: >3 FB Neck ROM: full    Dental  (+) Missing   Pulmonary sleep apnea, Continuous Positive Airway Pressure Ventilation and Oxygen sleep apnea , COPD, former smoker,    Pulmonary exam normal breath sounds clear to auscultation       Cardiovascular hypertension, + CAD  Normal cardiovascular exam Rhythm:regular Rate:Normal     Neuro/Psych    GI/Hepatic   Endo/Other  diabetes  Renal/GU CRFRenal disease     Musculoskeletal  (+) Arthritis ,   Abdominal   Peds  Hematology   Anesthesia Other Findings   Reproductive/Obstetrics                             Anesthesia Physical  Anesthesia Plan  ASA: III  Anesthesia Plan: MAC   Post-op Pain Management:    Induction: Intravenous  PONV Risk Score and Plan: 3 and Treatment may vary due to age or medical condition, TIVA and Midazolam  Airway Management Planned: Nasal Cannula and Natural Airway  Additional Equipment:   Intra-op Plan:   Post-operative Plan:   Informed Consent: I have reviewed the patients History and Physical, chart, labs and discussed the procedure including the risks, benefits and alternatives for the proposed anesthesia with the patient or authorized representative who has indicated his/her understanding and acceptance.     Dental Advisory Given  Plan Discussed with: CRNA  Anesthesia Plan Comments:         Anesthesia Quick Evaluation

## 2020-09-05 ENCOUNTER — Encounter: Payer: Self-pay | Admitting: Ophthalmology

## 2020-09-09 NOTE — Progress Notes (Signed)
Name: Amber Avery   MRN: [id number]    DOB: [birth year]   Date:09/10/2020       Progress Note  Subjective  Chief Complaint  Follow up   HPI  Breast pain: she states she called to schedule a mammogram but because she has noticed pain on left breast she was advised to be seen and to be evaluated She states usually notices when she wakes up in the morning and has been laying on right lateral decubitus, pain goes from her back to the anterior chest, described a dull sensation, intermittent, not burning or tingling. She has also noticed sharp pain under the left nipple when watching TV. She states her dog jumps usually on her left breast. Symptoms started two months and is intermittent , about a couple of times a week. No nipple discharge , no rashes on breast  HTN: taking benazepril, and bp has been towards low end of normal, she denies dizziness or palpitation, we will adjust dose   Patient Active Problem List   Diagnosis Date Noted  . Cellulitis of right ankle 04/12/2020  . BMI 50.0-59.9, adult ([place]) 02/06/2020  . History of gastritis 08/14/2019  . Angiodysplasia of stomach and duodenum   . Iron deficiency anemia 04/20/2018  . Anemia, unspecified 04/12/2017  . Lichen sclerosus of female genitalia 06/29/2016  . Primary osteoarthritis of both knees 04/07/2016  . Special screening for malignant neoplasms, colon   . Right thyroid nodule 06/26/2015  . Asthma, intermittent 04/03/2015  . Benign essential HTN 04/03/2015  . Carpal tunnel syndrome 04/03/2015  . Chronic kidney disease (CKD), stage II (mild) 04/03/2015  . Controlled gout 04/03/2015  . Arteriosclerosis of coronary artery 04/03/2015  . Diabetes mellitus with renal manifestations, controlled ([place]) 04/03/2015  . Dyslipidemia 04/03/2015  . Edema extremities 04/03/2015  . Elevated sedimentation rate 04/03/2015  . Knee pain 04/03/2015  . Lumbar radiculitis 04/03/2015  . Obstructive apnea 04/03/2015  . Lumbosacral spondylosis  04/03/2015  . Osteoarthritis, chronic 04/03/2015  . Psoriasis 04/03/2015  . Vitamin D deficiency 04/03/2015  . Varicose veins 04/03/2015  . Goiter 12/23/2014  . Degeneration of intervertebral disc of lumbar region 08/23/2014  . Corns and callosity 06/11/2009    Past Surgical History:  Procedure Laterality Date  . APPENDECTOMY    . CATARACT EXTRACTION W/PHACO Left 08/14/2020   Procedure: CATARACT EXTRACTION PHACO AND INTRAOCULAR LENS PLACEMENT ([facility]) LEFT DIABETIC 3.56 00:59.6 6.0%;  Surgeon: Leandrew Koyanagi, MD;  Location: [place];  Service: Ophthalmology;  Laterality: Left;  Diabetic  . CATARACT EXTRACTION W/PHACO Right 09/04/2020   Procedure: CATARACT EXTRACTION PHACO AND INTRAOCULAR LENS PLACEMENT ([place]) RIGHT DIABETIC;  Surgeon: Leandrew Koyanagi, MD;  Location: [place];  Service: Ophthalmology;  Laterality: Right;  4.72 1:03.2 7.4%  . CHOLECYSTECTOMY  1977  . COLONOSCOPY WITH PROPOFOL N/A 01/21/2016   Procedure: COLONOSCOPY WITH PROPOFOL;  Surgeon: Lucilla Lame, MD;  Location: [facility] ENDOSCOPY;  Service: Endoscopy;  Laterality: N/A;  . DILATION AND CURETTAGE OF UTERUS     Due to Amenorrhea  . ESOPHAGOGASTRODUODENOSCOPY (EGD) WITH PROPOFOL N/A 08/16/2018   Procedure: ESOPHAGOGASTRODUODENOSCOPY (EGD) WITH PROPOFOL;  Surgeon: Lucilla Lame, MD;  Location: [facility] END[facility]COPY;  Service: Endoscopy;  Laterality: N/A;  . [place]  . HEMORROIDECTOMY    . HERNIA REPAIR  2011  . Temporal Area Excision Biopsy     For Birth Mark Changes, Negative Pathology  . TONSILLECTOMY AND ADENOIDECTOMY      Family History  Problem Relation Age of Onset  .  Cancer Mother        brain tumor  . Kidney disease Mother   . Hypertension Mother   . Heart disease Father   . COPD Father   . Hypertension Sister   . Arthritis Sister   . Cancer Sister   . Heart murmur Sister   . GER disease Sister   . Lupus Sister   . Diabetes Sister   . Arthritis Sister   .  Osteopenia Sister   . Heart disease Sister   . Hypertension Sister   . Breast cancer Maternal Aunt 70  . Leukemia Maternal Aunt     Social History   Tobacco Use  . Smoking status: Former Smoker    Packs/day: 2.00    Years: 15.00    Pack years: 30.00    Types: Cigarettes    Quit date: 1983    Years since quitting: 38.9  . Smokeless tobacco: Never Used  . Tobacco comment: smoking cessation materials not required  Substance Use Topics  . Alcohol use: Not Currently    Alcohol/week: 0.0 standard drinks     Current Outpatient Medications:  .  acetaminophen (TYLENOL) 500 MG tablet, Take 1 tablet (500 mg total) by mouth every 6 (six) hours as needed. (Patient taking differently: Take 1,000 mg by mouth every 8 (eight) hours. ), Disp: 480 tablet, Rfl: 0 .  albuterol (ACCUNEB) 1.25 MG/3ML nebulizer solution, Take 3 mLs (1.25 mg total) by nebulization 2 (two) times daily., Disp: 75 mL, Rfl: 2 .  allopurinol (ZYLOPRIM) 300 MG tablet, Take 1 tablet (300 mg total) by mouth daily., Disp: 90 tablet, Rfl: 3 .  atorvastatin (LIPITOR) 40 MG tablet, Daily for cholesterol, Disp: 90 tablet, Rfl: 3 .  benazepril (LOTENSIN) 20 MG tablet, Take 1 tablet by mouth once daily, Disp: 90 tablet, Rfl: 0 .  Blood Glucose Monitoring Suppl (TRUE METRIX AIR GLUCOSE METER) w/Device KIT, 1 each by Does not apply route 1 day or 1 dose. Checks Fasting Blood Sugar Twice daily Diabetes mellitus with renal manifestations, controlled ([place])      Code: E11.29, Disp: 1 kit, Rfl: 0 .  diclofenac Sodium (VOLTAREN) 1 % GEL, Apply 4 g topically 4 (four) times daily., Disp: 100 g, Rfl: 5 .  Dulaglutide (TRULICITY) 1.5 NL/9.7QB SOPN, Inject 1.5 mg into the skin once a week., Disp: 12 pen, Rfl: 3 .  furosemide (LASIX) 40 MG tablet, Take 1 tablet by mouth once daily, Disp: 90 tablet, Rfl: 0 .  gabapentin (NEURONTIN) 300 MG capsule, TAKE 1 CAPSULE BY MOUTH IN THE MORNING AND 1 IN THE EVENING AND 2 AT NIGHT, Disp: 360 capsule, Rfl: 3 .   Insulin Pen Needle 32G X 6 MM MISC, 1 each by Does not apply route 4 (four) times daily., Disp: 200 each, Rfl: 2 .  Lancets (ONETOUCH ULTRASOFT) lancets, Use as instructed, Disp: 100 each, Rfl: 12 .  ONETOUCH VERIO test strip, USE AS DIRECTED, Disp: 50 each, Rfl: 0 .  potassium chloride SA (KLOR-CON) 20 MEQ tablet, Take 1 tablet by mouth once daily, Disp: 90 tablet, Rfl: 0 .  triamcinolone (KENALOG) 0.1 %, Apply topically., Disp: , Rfl:  .  Triamcinolone Acetonide (TRIAMCINOLONE 0.1 % CREAM : EUCERIN) CREA, Apply 1 application topically., Disp: , Rfl:  .  triamcinolone ointment (KENALOG) 0.1 %, Use BID prn, Disp: 90 g, Rfl: 1 .  Vitamin D, Cholecalciferol, 25 MCG (1000 UT) TABS, Take 1 capsule by mouth daily., Disp: 90 tablet, Rfl: 1 .  albuterol (VENTOLIN  HFA) 108 (90 Base) MCG/ACT inhaler, Inhale 2 puffs into the lungs every 6 (six) hours as needed for wheezing or shortness of breath. (Patient not taking: Reported on 08/14/2020), Disp: 8 g, Rfl: 0 .  budesonide (PULMICORT) 0.25 MG/2ML nebulizer solution, Take 2 mLs (0.25 mg total) by nebulization 2 (two) times daily. (Patient not taking: Reported on 04/30/2020), Disp: 60 mL, Rfl: 2  Allergies  Allergen Reactions  . Codeine Hives and Nausea And Vomiting    I personally reviewed active problem list, medication list, allergies, family history, social history, health maintenance with the patient/caregiver today.   ROS  Ten systems reviewed and is negative except as mentioned in HPI   Objective  Vitals:   09/10/20 1210  BP: 108/70  Pulse: 83  Resp: 18  Temp: 97.6 F (36.4 C)  TempSrc: Oral  SpO2: 98%  Weight: 268 lb 3.2 oz (121.7 kg)  Height: 5' 2" (1.575 m)    Body mass index is 49.05 kg/m.  Physical Exam  Constitutional: Patient appears well-developed and well-nourished. Obese  No distress.  HEENT: head atraumatic, normocephalic, pupils equal and reactive to light, neck supple Cardiovascular: Normal rate, regular rhythm and  normal heart sounds.  No murmur heard. Trace BLE edema. Pulmonary/Chest: Effort normal and breath sounds normal. No respiratory distress. Abdominal: Soft.  There is no tenderness. Breast ; lump that is tender at 9 o'clock about 2 inches from the nipple, inverted nipples bilaterally, no discharge, some pain on lower rib cage on left, no lumps Psychiatric: Patient has a normal mood and affect. behavior is normal. Judgment and thought content normal.  Recent Results (from the past 2160 hour(s))  POCT HgB A1C     Status: Abnormal   Collection Time: 08/12/20 10:59 AM  Result Value Ref Range   Hemoglobin A1C 6.8 (A) 4.0 - 5.6 %   HbA1c POC (<> result, manual entry)     HbA1c, POC (prediabetic range)     HbA1c, POC (controlled diabetic range)    SARS CORONAVIRUS 2 (TAT 6-24 HRS) Nasopharyngeal Nasopharyngeal Swab     Status: None   Collection Time: 08/12/20  1:33 PM   Specimen: Nasopharyngeal Swab  Result Value Ref Range   SARS Coronavirus 2 NEGATIVE NEGATIVE    Comment: (NOTE) SARS-CoV-2 target nucleic acids are NOT DETECTED.  The SARS-CoV-2 RNA is generally detectable in upper and lower respiratory specimens during the acute phase of infection. Negative results do not preclude SARS-CoV-2 infection, do not rule out co-infections with other pathogens, and should not be used as the sole basis for treatment or other patient management decisions. Negative results must be combined with clinical observations, patient history, and epidemiological information. The expected result is Negative.  Fact Sheet for Patients: SugarRoll.be  Fact Sheet for Healthcare Providers: https://www.woods-mathews.com/  This test is not yet approved or cleared by the [place] FDA and  has been authorized for detection and/or diagnosis of SARS-CoV-2 by FDA under an Emergency Use Authorization (EUA). This EUA will remain  in effect (meaning this test can be used) for  the duration of the COVID-19 declaration under Se ction 564(b)(1) of the Act, 21 U.S.C. section 360bbb-3(b)(1), unless the authorization is terminated or revoked sooner.  Performed at [facility], [place] [address] [place], [place] [address]   Glucose, capillary     Status: Abnormal   Collection Time: 08/14/20  6:39 AM  Result Value Ref Range   Glucose-Capillary 124 (H) 70 - 99 mg/dL    Comment:  Glucose reference range applies only to samples taken after fasting for at least 8 hours.  Glucose, capillary     Status: Abnormal   Collection Time: 08/14/20  8:07 AM  Result Value Ref Range   Glucose-Capillary 113 (H) 70 - 99 mg/dL    Comment: Glucose reference range applies only to samples taken after fasting for at least 8 hours.  SARS CORONAVIRUS 2 (TAT 6-24 HRS) Nasopharyngeal Nasopharyngeal Swab     Status: None   Collection Time: 09/02/20  2:12 PM   Specimen: Nasopharyngeal Swab  Result Value Ref Range   SARS Coronavirus 2 NEGATIVE NEGATIVE    Comment: (NOTE) SARS-CoV-2 target nucleic acids are NOT DETECTED.  The SARS-CoV-2 RNA is generally detectable in upper and lower respiratory specimens during the acute phase of infection. Negative results do not preclude SARS-CoV-2 infection, do not rule out co-infections with other pathogens, and should not be used as the sole basis for treatment or other patient management decisions. Negative results must be combined with clinical observations, patient history, and epidemiological information. The expected result is Negative.  Fact Sheet for Patients: SugarRoll.be  Fact Sheet for Healthcare Providers: https://www.woods-mathews.com/  This test is not yet approved or cleared by the [place] FDA and  has been authorized for detection and/or diagnosis of SARS-CoV-2 by FDA under an Emergency Use Authorization (EUA). This EUA will remain  in effect (meaning this test can be used) for the  duration of the COVID-19 declaration under Se ction 564(b)(1) of the Act, 21 U.S.C. section 360bbb-3(b)(1), unless the authorization is terminated or revoked sooner.  Performed at [facility], [place] [address], [place], [place] [address]   Glucose, capillary     Status: Abnormal   Collection Time: 09/04/20  7:07 AM  Result Value Ref Range   Glucose-Capillary 122 (H) 70 - 99 mg/dL    Comment: Glucose reference range applies only to samples taken after fasting for at least 8 hours.  Glucose, capillary     Status: Abnormal   Collection Time: 09/04/20  8:37 AM  Result Value Ref Range   Glucose-Capillary 121 (H) 70 - 99 mg/dL    Comment: Glucose reference range applies only to samples taken after fasting for at least 8 hours.      PHQ2/9: Depression screen [facility] 2/9 09/10/2020 08/12/2020 06/20/2020 04/12/2020 04/12/2020  Decreased Interest 0 0 0 0 0  Down, Depressed, Hopeless 0 0 0 0 0  PHQ - 2 Score 0 0 0 0 0  Altered sleeping 0 - - 0 0  Tired, decreased energy 1 - - 0 0  Change in appetite 0 - - 0 0  Feeling bad or failure about yourself  0 - - 0 0  Trouble concentrating 0 - - 0 0  Moving slowly or fidgety/restless 0 - - 0 0  Suicidal thoughts 0 - - 0 0  PHQ-9 Score 1 - - 0 0  Difficult doing work/chores Not difficult at all - - - -  Some recent data might be hidden    phq 9 is negative   Fall Risk: Fall Risk  08/12/2020 06/20/2020 04/12/2020 08/14/2019 06/20/2019  Falls in the past year? 0 0 0 0 0  Number falls in past yr: 0 0 0 0 0  Injury with Fall? 0 0 0 0 0  Risk for fall due to : - Impaired mobility;Impaired balance/gait - - -  Risk for fall due to: Comment - - - - -  Follow up -  Falls prevention discussed - - -     Functional Status Survey: Is the patient deaf or have difficulty hearing?: No Does the patient have difficulty seeing, even when wearing glasses/contacts?: No Does the patient have difficulty concentrating, remembering, or making decisions?: No Does  the patient have difficulty walking or climbing stairs?: Yes Does the patient have difficulty dressing or bathing?: No Does the patient have difficulty doing errands alone such as visiting a doctor's office or shopping?: No    Assessment & Plan  1. Benign essential HTN  - benazepril (LOTENSIN) 10 MG tablet; Take 1 tablet (10 mg total) by mouth daily.  Dispense: 90 tablet; Refill: 0  2. Stage 3a chronic kidney disease (HCC)  - benazepril (LOTENSIN) 10 MG tablet; Take 1 tablet (10 mg total) by mouth daily.  Dispense: 90 tablet; Refill: 0  3. Breast pain, left  - benazepril (LOTENSIN) 10 MG tablet; Take 1 tablet (10 mg total) by mouth daily.  Dispense: 90 tablet; Refill: 0 - MM Digital Diagnostic Bilat; Future - US BREAST LTD UNI LEFT INC AXILLA; Future  4. Breast cancer screening by mammogram  - MM Digital Diagnostic Bilat; Future - US BREAST LTD UNI LEFT INC AXILLA; Future

## 2020-09-10 ENCOUNTER — Ambulatory Visit (INDEPENDENT_AMBULATORY_CARE_PROVIDER_SITE_OTHER): Payer: Medicare HMO | Admitting: Family Medicine

## 2020-09-10 ENCOUNTER — Other Ambulatory Visit: Payer: Self-pay

## 2020-09-10 ENCOUNTER — Encounter: Payer: Self-pay | Admitting: Family Medicine

## 2020-09-10 VITALS — BP 108/70 | HR 83 | Temp 97.6°F | Resp 18 | Ht 62.0 in | Wt 268.2 lb

## 2020-09-10 DIAGNOSIS — N644 Mastodynia: Secondary | ICD-10-CM

## 2020-09-10 DIAGNOSIS — I10 Essential (primary) hypertension: Secondary | ICD-10-CM | POA: Diagnosis not present

## 2020-09-10 DIAGNOSIS — Z1231 Encounter for screening mammogram for malignant neoplasm of breast: Secondary | ICD-10-CM | POA: Diagnosis not present

## 2020-09-10 DIAGNOSIS — N1831 Chronic kidney disease, stage 3a: Secondary | ICD-10-CM | POA: Diagnosis not present

## 2020-09-10 MED ORDER — BENAZEPRIL HCL 10 MG PO TABS: 10.0000 mg | ORAL_TABLET | Freq: Every day | ORAL | 0 refills | Status: DC

## 2020-09-16 DIAGNOSIS — J449 Chronic obstructive pulmonary disease, unspecified: Secondary | ICD-10-CM | POA: Diagnosis not present

## 2020-09-18 ENCOUNTER — Telehealth: Payer: Self-pay

## 2020-09-18 NOTE — Progress Notes (Signed)
Chronic Care Management Pharmacy Assistant   Name: Amber Avery  MRN: [id number] DOB: 1946-09-16  Reason for Encounter: Medication Management  Patient Questions:  1.  Have you seen any other providers since your last visit? Yes, Leandrew Koyanagi (opthamology) and Steele Sizer (PCP)  2.  Any changes in your medicines or health? No   Amber Avery,  74 y.o. , female  PCP : Steele Sizer, MD  Allergies:   Allergies  Allergen Reactions  . Codeine Hives and Nausea And Vomiting    Medications: Outpatient Encounter Medications as of 09/18/2020  Medication Sig Note  . acetaminophen (TYLENOL) 500 MG tablet Take 1 tablet (500 mg total) by mouth every 6 (six) hours as needed. (Patient taking differently: Take 1,000 mg by mouth every 8 (eight) hours. )   . albuterol (ACCUNEB) 1.25 MG/3ML nebulizer solution Take 3 mLs (1.25 mg total) by nebulization 2 (two) times daily. 10/20/2018: Patient has not needed to use nebulizer  . albuterol (VENTOLIN HFA) 108 (90 Base) MCG/ACT inhaler Inhale 2 puffs into the lungs every 6 (six) hours as needed for wheezing or shortness of breath. (Patient not taking: Reported on 08/14/2020)   . allopurinol (ZYLOPRIM) 300 MG tablet Take 1 tablet (300 mg total) by mouth daily.   [name] Kitchen atorvastatin (LIPITOR) 40 MG tablet Daily for cholesterol   . benazepril (LOTENSIN) 10 MG tablet Take 1 tablet (10 mg total) by mouth daily.   . Blood Glucose Monitoring Suppl (TRUE METRIX AIR GLUCOSE METER) w/Device KIT 1 each by Does not apply route 1 day or 1 dose. Checks Fasting Blood Sugar Twice daily Diabetes mellitus with renal manifestations, controlled ([place])      Code: E11.29 06/20/2020: Pt using one touch verio meter  . budesonide (PULMICORT) 0.25 MG/2ML nebulizer solution Take 2 mLs (0.25 mg total) by nebulization 2 (two) times daily. (Patient not taking: Reported on 04/30/2020) 10/20/2018: Patient has not needed   . diclofenac Sodium (VOLTAREN) 1 % GEL Apply 4 g topically 4  (four) times daily.   . Dulaglutide (TRULICITY) 1.5 JT/7.0VX SOPN Inject 1.5 mg into the skin once a week.   . furosemide (LASIX) 40 MG tablet Take 1 tablet by mouth once daily   . gabapentin (NEURONTIN) 300 MG capsule TAKE 1 CAPSULE BY MOUTH IN THE MORNING AND 1 IN THE EVENING AND 2 AT NIGHT   . Insulin Pen Needle 32G X 6 MM MISC 1 each by Does not apply route 4 (four) times daily. 10/20/2018: Trouble with copay  . Lancets (ONETOUCH ULTRASOFT) lancets Use as instructed   . ONETOUCH VERIO test strip USE AS DIRECTED   . potassium chloride SA (KLOR-CON) 20 MEQ tablet Take 1 tablet by mouth once daily   . triamcinolone (KENALOG) 0.1 % Apply topically.   . Triamcinolone Acetonide (TRIAMCINOLONE 0.1 % CREAM : EUCERIN) CREA Apply 1 application topically.   . triamcinolone ointment (KENALOG) 0.1 % Use BID prn   . Vitamin D, Cholecalciferol, 25 MCG (1000 UT) TABS Take 1 capsule by mouth daily.   . [DISCONTINUED] potassium chloride SA (KLOR-CON) 20 MEQ tablet Take 1 tablet (20 mEq total) by mouth daily.    No facility-administered encounter medications on file as of 09/18/2020.    Current Diagnosis: Patient Assistance Coordination   Follow-Up:  Pharmacy Review Mailed application for Trulicity patient assistance to patient. Patient to complete and return it and any needed supporting documents to office so that Steele Sizer, MD can provide prescription for application. Patient may  contact me if any questions, phone number provided.  Doristine Counter, [place], [place] Pharmacist Assistant [phone]

## 2020-09-28 ENCOUNTER — Other Ambulatory Visit: Payer: Self-pay | Admitting: Family Medicine

## 2020-09-28 DIAGNOSIS — I10 Essential (primary) hypertension: Secondary | ICD-10-CM

## 2020-09-28 NOTE — Telephone Encounter (Signed)
Requested Prescriptions  Pending Prescriptions Disp Refills  . potassium chloride SA (KLOR-CON) 20 MEQ tablet [Pharmacy Med Name: Potassium Chloride Crys ER 20 MEQ Oral Tablet Extended Release] 90 tablet 0    Sig: Take 1 tablet by mouth once daily     Endocrinology:  Minerals - Potassium Supplementation Failed - 09/28/2020 12:21 PM      Failed - Cr in normal range and within 360 days    Creat  Date Value Ref Range Status  12/14/2019 1.16 (H) 0.60 - 0.93 mg/dL Final    Comment:    For patients >71 years of age, the reference limit for Creatinine is approximately 13% higher for people identified as African-American. .    Creatinine, Urine  Date Value Ref Range Status  12/14/2019 23 20 - 275 mg/dL Final         Passed - K in normal range and within 360 days    Potassium  Date Value Ref Range Status  12/14/2019 4.8 3.5 - 5.3 mmol/L Final         Passed - Valid encounter within last 12 months    Recent Outpatient Visits          2 weeks ago Benign essential HTN   [facility], Drue Stager, MD   1 month ago Controlled type 2 diabetes mellitus with stage 3 chronic kidney disease, without long-term current use of insulin [facility])   [facility] [place], Drue Stager, MD   5 months ago Controlled type 2 diabetes mellitus with stage 3 chronic kidney disease, without long-term current use of insulin [facility])   [facility] [place], Drue Stager, MD   9 months ago Controlled type 2 diabetes mellitus with stage 3 chronic kidney disease, without long-term current use of insulin [facility])   [facility] [place], Drue Stager, MD   1 year ago Controlled type 2 diabetes mellitus with stage 3 chronic kidney disease, without long-term current use of insulin [facility])   [facility] Steele Sizer, MD      Future Appointments            In 2 months Ancil Boozer, Drue Stager, MD [facility], [facility]   In 8 months  [facility], [facility]           '

## 2020-10-03 ENCOUNTER — Ambulatory Visit: Payer: Medicare HMO

## 2020-10-03 DIAGNOSIS — E1122 Type 2 diabetes mellitus with diabetic chronic kidney disease: Secondary | ICD-10-CM

## 2020-10-03 DIAGNOSIS — E1169 Type 2 diabetes mellitus with other specified complication: Secondary | ICD-10-CM

## 2020-10-03 DIAGNOSIS — E785 Hyperlipidemia, unspecified: Secondary | ICD-10-CM

## 2020-10-03 NOTE — Patient Instructions (Signed)
Visit Information It was great speaking with you today!  Please let me know if you have any questions about our visit. Goals Addressed            This Visit's Progress   . Chronic Care Management       CARE PLAN ENTRY (see longitudinal plan of care for additional care plan information)  Current Barriers:  . Chronic Disease Management support, education, and care coordination needs related to Hypertension, Hyperlipidemia, Diabetes, Asthma, Chronic Kidney Disease, Osteoarthritis, Gout, and Chronic Pain   Hyperlipidemia Lab Results  Component Value Date/Time   LDLCALC 67 12/14/2019 10:49 AM   . Pharmacist Clinical Goal(s): o Over the next 90 days, patient will work with PharmD and providers to maintain LDL goal < 70 . Current regimen:  o Lipitor 40 mg daily . Interventions: o None . Patient self care activities - Over the next 90 days, patient will: o Continue to take atorvastatin daily  Diabetes Lab Results  Component Value Date/Time   HGBA1C 7.0 (A) 04/12/2020 10:54 AM   HGBA1C 6.6 (A) 12/14/2019 10:05 AM   HGBA1C 6.0 04/14/2019 10:33 AM   HGBA1C 6.3 12/08/2018 11:22 AM   . Pharmacist Clinical Goal(s): o Over the next 90 days, patient will work with PharmD and providers to maintain A1c goal <7% . Current regimen:  o Trulicity 1.5mg  inject weekly . Interventions: o None . Patient self care activities - Over the next 90 days, patient will: o Check blood sugar once daily, document, and provide at future appointments o Contact provider with any episodes of hypoglycemia  Medication management . Pharmacist Clinical Goal(s): o Over the next 90 days, patient will work with PharmD and providers to maintain optimal medication adherence . Current pharmacy: [facility] . Interventions o Comprehensive medication review performed. o Continue current medication management strategy . Patient self care activities - Over the next 90 days, patient will: o Focus on medication adherence  by continuing current practices o Take medications as prescribed o Report any questions or concerns to PharmD and/or provider(s)       The patient verbalized understanding of instructions, educational materials, and care plan provided today and declined offer to receive copy of patient instructions, educational materials, and care plan.   Telephone follow up appointment with pharmacy team member scheduled for: 04/03/21 at 10:30 AM  [facility] [phone]

## 2020-10-03 NOTE — Chronic Care Management (AMB) (Signed)
Chronic Care Management Pharmacy  Name: Amber Avery  MRN: [id number] DOB: [birth year]  Chief Complaint/ HPI  Amber Avery,  74 y.o. , female presents for their Follow-Up CCM visit with the clinical pharmacist via telephone.  PCP : Steele Sizer, MD  Their chronic conditions include: Hypertension, Hyperlipidemia, Diabetes, Asthma, Chronic Kidney Disease, Osteoarthritis, Gout, and Chronic Pain  Office Visits:  09/10/20: Patient presented to Dr. Ancil Boozer for follow-up. Benazepril decreased to 10 mg daily.  08/12/20: Patient presented to Dr. Ancil Boozer for follow-up. A1c stable at 6.8%. Albuterol inhaler + Neb refilled. Vitamin B12 stopped.   Consult Visit:  09/04/20: Patient admitted for cataract extraction procedure of right eye. 08/14/20: Patient admitted for cataract extraction procedure of right eye.  Medications: Outpatient Encounter Medications as of 10/03/2020  Medication Sig Note  . acetaminophen (TYLENOL) 500 MG tablet Take 1 tablet (500 mg total) by mouth every 6 (six) hours as needed. (Patient taking differently: Take 1,000 mg by mouth every 8 (eight) hours. )   . albuterol (ACCUNEB) 1.25 MG/3ML nebulizer solution Take 3 mLs (1.25 mg total) by nebulization 2 (two) times daily. 10/20/2018: Patient has not needed to use nebulizer  . albuterol (VENTOLIN HFA) 108 (90 Base) MCG/ACT inhaler Inhale 2 puffs into the lungs every 6 (six) hours as needed for wheezing or shortness of breath. (Patient not taking: Reported on 08/14/2020)   . allopurinol (ZYLOPRIM) 300 MG tablet Take 1 tablet (300 mg total) by mouth daily.   [name] Kitchen atorvastatin (LIPITOR) 40 MG tablet Daily for cholesterol   . benazepril (LOTENSIN) 10 MG tablet Take 1 tablet (10 mg total) by mouth daily.   . Blood Glucose Monitoring Suppl (TRUE METRIX AIR GLUCOSE METER) w/Device KIT 1 each by Does not apply route 1 day or 1 dose. Checks Fasting Blood Sugar Twice daily Diabetes mellitus with renal manifestations, controlled  ([place])      Code: E11.29 06/20/2020: Pt using one touch verio meter  . budesonide (PULMICORT) 0.25 MG/2ML nebulizer solution Take 2 mLs (0.25 mg total) by nebulization 2 (two) times daily. (Patient not taking: Reported on 04/30/2020) 10/20/2018: Patient has not needed   . diclofenac Sodium (VOLTAREN) 1 % GEL Apply 4 g topically 4 (four) times daily.   . Dulaglutide (TRULICITY) 1.5 OH/7.2BM SOPN Inject 1.5 mg into the skin once a week.   . furosemide (LASIX) 40 MG tablet Take 1 tablet by mouth once daily   . gabapentin (NEURONTIN) 300 MG capsule TAKE 1 CAPSULE BY MOUTH IN THE MORNING AND 1 IN THE EVENING AND 2 AT NIGHT   . Insulin Pen Needle 32G X 6 MM MISC 1 each by Does not apply route 4 (four) times daily. 10/20/2018: Trouble with copay  . Lancets (ONETOUCH ULTRASOFT) lancets Use as instructed   . ONETOUCH VERIO test strip USE AS DIRECTED   . potassium chloride SA (KLOR-CON) 20 MEQ tablet Take 1 tablet by mouth once daily   . triamcinolone (KENALOG) 0.1 % Apply topically.   . Triamcinolone Acetonide (TRIAMCINOLONE 0.1 % CREAM : EUCERIN) CREA Apply 1 application topically.   . triamcinolone ointment (KENALOG) 0.1 % Use BID prn   . Vitamin D, Cholecalciferol, 25 MCG (1000 UT) TABS Take 1 capsule by mouth daily.   . [DISCONTINUED] potassium chloride SA (KLOR-CON) 20 MEQ tablet Take 1 tablet (20 mEq total) by mouth daily.    No facility-administered encounter medications on file as of 10/03/2020.   Current Diagnosis/Assessment:  SDOH Interventions   Flowsheet Row Most Recent  Value  SDOH Interventions   Financial Strain Interventions Intervention Not Indicated  Transportation Interventions Intervention Not Indicated     Goals Addressed            This Visit's Progress   . Chronic Care Management       CARE PLAN ENTRY (see longitudinal plan of care for additional care plan information)  Current Barriers:  . Chronic Disease Management support, education, and care coordination needs related  to Hypertension, Hyperlipidemia, Diabetes, Asthma, Chronic Kidney Disease, Osteoarthritis, Gout, and Chronic Pain   Hyperlipidemia Lab Results  Component Value Date/Time   LDLCALC 67 12/14/2019 10:49 AM   . Pharmacist Clinical Goal(s): o Over the next 90 days, patient will work with PharmD and providers to maintain LDL goal < 70 . Current regimen:  o Lipitor 40 mg daily . Interventions: o None . Patient self care activities - Over the next 90 days, patient will: o Continue to take atorvastatin daily  Diabetes Lab Results  Component Value Date/Time   HGBA1C 7.0 (A) 04/12/2020 10:54 AM   HGBA1C 6.6 (A) 12/14/2019 10:05 AM   HGBA1C 6.0 04/14/2019 10:33 AM   HGBA1C 6.3 12/08/2018 11:22 AM   . Pharmacist Clinical Goal(s): o Over the next 90 days, patient will work with PharmD and providers to maintain A1c goal <7% . Current regimen:  o Trulicity 0.2DX inject weekly . Interventions: o None . Patient self care activities - Over the next 90 days, patient will: o Check blood sugar once daily, document, and provide at future appointments o Contact provider with any episodes of hypoglycemia  Medication management . Pharmacist Clinical Goal(s): o Over the next 90 days, patient will work with PharmD and providers to maintain optimal medication adherence . Current pharmacy: [facility] . Interventions o Comprehensive medication review performed. o Continue current medication management strategy . Patient self care activities - Over the next 90 days, patient will: o Focus on medication adherence by continuing current practices o Take medications as prescribed o Report any questions or concerns to PharmD and/or provider(s)      Hypertension   BP goal is:  <130/80  Office blood pressures are  BP Readings from Last 3 Encounters:  09/10/20 108/70  09/04/20 (!) 96/56  08/14/20 (!) 102/52   Lab Results  Component Value Date   CREATININE 1.16 (H) 12/14/2019   BUN 43 (H) 12/14/2019    GFRNONAA 47 (L) 12/14/2019   GFRAA 54 (L) 12/14/2019   NA 140 12/14/2019   K 4.8 12/14/2019   CALCIUM 9.3 12/14/2019   CO2 24 12/14/2019   Patient checks BP at home when feeling symptomatic Patient home BP readings are ranging: NA  Patient has failed these meds in the past: NA Patient is currently controlled on the following medications:  . Benazepril 10 mg daily (still finishing up supply of 20 mg tablets, taking 1/2 tablet daily) . Furosemide 40 mg daily   We discussed diet and exercise extensively. Patient reports she feels much better since decreasing dose of benazepril, less dizziness overall. She has been busy and has not checked her BP recently.  Plan  Continue current medications  Increase monitoring to once weekly  Hyperlipidemia   LDL goal < 70  Last lipids Lab Results  Component Value Date   CHOL 141 12/14/2019   HDL 52 12/14/2019   LDLCALC 67 12/14/2019   TRIG 141 12/14/2019   CHOLHDL 2.7 12/14/2019   Hepatic Function Latest Ref Rng & Units 12/14/2019 12/08/2018 04/11/2018  Total  Protein 6.1 - 8.1 g/dL 6.6 6.7 6.9  Albumin 3.6 - 5.1 g/dL - - -  AST 10 - 35 U/L 22 27 39(H)  ALT 6 - 29 U/L 22 26 33(H)  Alk Phosphatase 33 - 130 U/L - - -  Total Bilirubin 0.2 - 1.2 mg/dL 0.7 0.6 0.5     The 10-year ASCVD risk score Mikey Bussing DC Jr., et al., 2013) is: 21.7%   Values used to calculate the score:     Age: 86 years     Sex: Female     Is Non-Hispanic African American: No     Diabetic: Yes     Tobacco smoker: No     Systolic Blood Pressure: 161 mmHg     Is BP treated: Yes     HDL Cholesterol: 52 mg/dL     Total Cholesterol: 141 mg/dL   Patient has failed these meds in past: NA Patient is currently controlled on the following medications:  . Atorvastatin 40 mg daily   We discussed:  diet and exercise extensively  Plan  Continue current medications  Diabetes   A1c goal <7%  Recent Relevant Labs: Lab Results  Component Value Date/Time   HGBA1C 6.8  (A) 08/12/2020 10:59 AM   HGBA1C 7.0 (A) 04/12/2020 10:54 AM   HGBA1C 6.0 04/14/2019 10:33 AM   HGBA1C 6.3 12/08/2018 11:22 AM   MICROALBUR 0.3 12/14/2019 10:49 AM   MICROALBUR 1.2 04/14/2019 11:25 AM   MICROALBUR 20 04/11/2018 10:53 AM   MICROALBUR 20 08/09/2017 09:57 AM    Last diabetic Eye exam:  Lab Results  Component Value Date/Time   HMDIABEYEEXA No Retinopathy 06/11/2020 12:00 AM    Last diabetic Foot exam: No results found for: HMDIABFOOTEX   Checking BG: Weekly  Recent FBG Readings: NA Recent pre-meal BG readings: NA Recent 2hr PP BG readings:  NA Recent HS BG readings: NA  Patient has failed these meds in past: NA Patient is currently controlled on the following medications: . Trulicity 1.5 mg weekly  We discussed: diet and exercise extensively. Patient has not been routinely monitoring her blood sugars as she has been busy during the holiday season. She has been baking a lot and eating homemade banana bread and cheese bread, but she states she gifts most of her baked goods away to others.  PAP for Trulicity 0960 submitted to [facility], will call to confirm patient enrollment. Patient does not want automatic refills.   Plan  Continue current medications  Hyperlipidemia   LDL goal < 70  Lipid Panel     Component Value Date/Time   CHOL 141 12/14/2019 1049   CHOL 123 07/31/2016 0910   TRIG 141 12/14/2019 1049   HDL 52 12/14/2019 1049   HDL 45 07/31/2016 0910   LDLCALC 67 12/14/2019 1049    Hepatic Function Latest Ref Rng & Units 12/14/2019 12/08/2018 04/11/2018  Total Protein 6.1 - 8.1 g/dL 6.6 6.7 6.9  Albumin 3.6 - 5.1 g/dL - - -  AST 10 - 35 U/L 22 27 39(H)  ALT 6 - 29 U/L 22 26 33(H)  Alk Phosphatase 33 - 130 U/L - - -  Total Bilirubin 0.2 - 1.2 mg/dL 0.7 0.6 0.5     The 10-year ASCVD risk score Mikey Bussing DC Jr., et al., 2013) is: 21.7%   Values used to calculate the score:     Age: 39 years     Sex: Female     Is Non-Hispanic African American: No  Diabetic: Yes     Tobacco smoker: No     Systolic Blood Pressure: 578 mmHg     Is BP treated: Yes     HDL Cholesterol: 52 mg/dL     Total Cholesterol: 141 mg/dL   Patient has failed these meds in past: NA Patient is currently controlled on the following medications:  . Lipitor 27m daily  We discussed:  At goal Denies myalgias  Plan  Continue current medications  Medication Management   [place] U[place] box? Yes Pt endorses 100% compliance  We discussed:  Plan  Continue current medication management strategy  Follow up: 3 month phone visit  [phone]

## 2020-10-10 DIAGNOSIS — Z961 Presence of intraocular lens: Secondary | ICD-10-CM | POA: Diagnosis not present

## 2020-10-16 DIAGNOSIS — J449 Chronic obstructive pulmonary disease, unspecified: Secondary | ICD-10-CM | POA: Diagnosis not present

## 2020-10-30 ENCOUNTER — Telehealth: Payer: Self-pay

## 2020-10-30 NOTE — Chronic Care Management (AMB) (Signed)
Chronic Care Management Pharmacy Assistant   Name: Amber Avery  MRN: [id number] DOB: 1946-08-08  Reason for Encounter: Patient Assistance Coordinaton  PCP : Steele Sizer, MD  10/30/2020- Patient called to inform that she has received more boxes of her patient assistance medication Trulicity from [employer]. Patient states she already had 5 boxes with 4 pens in each box and today she received another box with 4 pens. Patient would like to have automatic refills taken off of her applications. She is concerned about having too many medications on hand that could expire. Informed patient to double check on pens for expiration dates before using and we will contact [employer] to remove automatic refills from application, she would like to call them when she is down to 1 box of 4 pens before getting anymore shipment. Patient aware I will inform Junius Argyle, CPP and call [employer].  11/12/2020- Patient called stating another shipment of medications were sent to PCP office today and she does not need that medication. Office is asking her to pick up and she does not have anywhere to put. Patient aware I am calling [employer] again to see why samples were sent.  [employer], spoke with representative, she informed me that patient had 2 applications on file, one that was going to her home just expired, but new application has been checked to go to providers office and that is where medications were sent for the new year, informed representative that patient is not in need of these samples at this time and would like for medication to be returned. Representative was able to generate a return label and sent to the office, when the office receives UPS will pick up medications. Also requested for auto refills to stop, patient will call when refills needed. Unfortunately per representative, we have to wait until next new order to stop auto refills and that is not until April 14,2022. Reminder placed  to call [facility] April 10th to stop auto refill and change delivery location.   11/13/2020- Called office to update medication shipment return label being sent. Left message with Santiago Glad, she will inform nurses and they will contact me if they need a shipping container box. Junius Argyle, CPP notified.    Allergies:   Allergies  Allergen Reactions  . Codeine Hives and Nausea And Vomiting    Medications: Outpatient Encounter Medications as of 10/30/2020  Medication Sig Note  . acetaminophen (TYLENOL) 500 MG tablet Take 1 tablet (500 mg total) by mouth every 6 (six) hours as needed. (Patient taking differently: Take 1,000 mg by mouth every 8 (eight) hours. )   . albuterol (ACCUNEB) 1.25 MG/3ML nebulizer solution Take 3 mLs (1.25 mg total) by nebulization 2 (two) times daily. 10/20/2018: Patient has not needed to use nebulizer  . albuterol (VENTOLIN HFA) 108 (90 Base) MCG/ACT inhaler Inhale 2 puffs into the lungs every 6 (six) hours as needed for wheezing or shortness of breath. (Patient not taking: Reported on 08/14/2020)   . allopurinol (ZYLOPRIM) 300 MG tablet Take 1 tablet (300 mg total) by mouth daily.   [name] Kitchen atorvastatin (LIPITOR) 40 MG tablet Daily for cholesterol   . benazepril (LOTENSIN) 10 MG tablet Take 1 tablet (10 mg total) by mouth daily.   . Blood Glucose Monitoring Suppl (TRUE METRIX AIR GLUCOSE METER) w/Device KIT 1 each by Does not apply route 1 day or 1 dose. Checks Fasting Blood Sugar Twice daily Diabetes mellitus with renal manifestations, controlled ([place])  Code: E11.29 06/20/2020: Pt using one touch verio meter  . budesonide (PULMICORT) 0.25 MG/2ML nebulizer solution Take 2 mLs (0.25 mg total) by nebulization 2 (two) times daily. (Patient not taking: Reported on 04/30/2020) 10/20/2018: Patient has not needed   . diclofenac Sodium (VOLTAREN) 1 % GEL Apply 4 g topically 4 (four) times daily.   . Dulaglutide (TRULICITY) 1.5 VW/9.7XY SOPN Inject 1.5 mg into the skin once a week.   .  furosemide (LASIX) 40 MG tablet Take 1 tablet by mouth once daily   . gabapentin (NEURONTIN) 300 MG capsule TAKE 1 CAPSULE BY MOUTH IN THE MORNING AND 1 IN THE EVENING AND 2 AT NIGHT   . Insulin Pen Needle 32G X 6 MM MISC 1 each by Does not apply route 4 (four) times daily. 10/20/2018: Trouble with copay  . Lancets (ONETOUCH ULTRASOFT) lancets Use as instructed   . ONETOUCH VERIO test strip USE AS DIRECTED   . potassium chloride SA (KLOR-CON) 20 MEQ tablet Take 1 tablet by mouth once daily   . triamcinolone (KENALOG) 0.1 % Apply topically.   . Triamcinolone Acetonide (TRIAMCINOLONE 0.1 % CREAM : EUCERIN) CREA Apply 1 application topically.   . triamcinolone ointment (KENALOG) 0.1 % Use BID prn   . Vitamin D, Cholecalciferol, 25 MCG (1000 UT) TABS Take 1 capsule by mouth daily.    No facility-administered encounter medications on file as of 10/30/2020.    Current Diagnosis: Patient Active Problem List   Diagnosis Date Noted  . Cellulitis of right ankle 04/12/2020  . BMI 50.0-59.9, adult ([place]) 02/06/2020  . History of gastritis 08/14/2019  . Angiodysplasia of stomach and duodenum   . Iron deficiency anemia 04/20/2018  . Anemia, unspecified 04/12/2017  . Lichen sclerosus of female genitalia 06/29/2016  . Primary osteoarthritis of both knees 04/07/2016  . Special screening for malignant neoplasms, colon   . Right thyroid nodule 06/26/2015  . Asthma, intermittent 04/03/2015  . Benign essential HTN 04/03/2015  . Carpal tunnel syndrome 04/03/2015  . Chronic kidney disease (CKD), stage II (mild) 04/03/2015  . Controlled gout 04/03/2015  . Arteriosclerosis of coronary artery 04/03/2015  . Diabetes mellitus with renal manifestations, controlled ([place]) 04/03/2015  . Dyslipidemia 04/03/2015  . Edema extremities 04/03/2015  . Elevated sedimentation rate 04/03/2015  . Knee pain 04/03/2015  . Lumbar radiculitis 04/03/2015  . Obstructive apnea 04/03/2015  . Lumbosacral spondylosis 04/03/2015  .  Osteoarthritis, chronic 04/03/2015  . Psoriasis 04/03/2015  . Vitamin D deficiency 04/03/2015  . Varicose veins 04/03/2015  . Goiter 12/23/2014  . Degeneration of intervertebral disc of lumbar region 08/23/2014  . Corns and callosity 06/11/2009     Follow-Up:  Patient [place] to follow up and make sure patient does not have 2 applications on file. Per representative, patient only has 1 and not sure why she had 1 going to her home and 1 going to office. She also mentioned that [employer] are for good until the end of each calendar year for Medicare part D patients. Junius Argyle, CPP aware.  [name], [place] Pharmacist Assistant [phone]

## 2020-11-12 ENCOUNTER — Other Ambulatory Visit: Payer: Self-pay | Admitting: Family Medicine

## 2020-11-12 DIAGNOSIS — R600 Localized edema: Secondary | ICD-10-CM

## 2020-11-13 ENCOUNTER — Telehealth: Payer: Self-pay

## 2020-11-13 NOTE — Telephone Encounter (Signed)
Copied from [place] [phone]. Topic: General - Other >> Nov 13, 2020 10:49 AM Leward Quan A wrote: Reason for CRM: Patient pharmacist Felicity Coyer called to inform that there will be a label coming from [facility] to return extra medication that arrived for patient. Please call Felicity Coyer if in need of storage container to return the medication to [facility] care. She is working on having medication delivered to patients home.  Ph# [phone]

## 2020-11-16 DIAGNOSIS — J449 Chronic obstructive pulmonary disease, unspecified: Secondary | ICD-10-CM | POA: Diagnosis not present

## 2020-11-21 ENCOUNTER — Telehealth: Payer: Self-pay

## 2020-11-21 NOTE — Telephone Encounter (Signed)
Copied from [place] [phone]. Topic: General - Inquiry >> Nov 21, 2020  9:48 AM Gillis Ends D wrote: Reason for CRM: Allie from [facility] care and she needs a new prescription or recent clinical notes where she came in. She needs to get authorization from her insurance for the equipment. She can be reached at [phone] ext [phone]

## 2020-11-21 NOTE — Telephone Encounter (Signed)
Tried calling number- it just kept ringing then disconnected. Will try again later.

## 2020-11-22 NOTE — Telephone Encounter (Signed)
2nd attempt, call still would no go through.

## 2020-11-26 ENCOUNTER — Telehealth: Payer: Self-pay

## 2020-12-05 ENCOUNTER — Telehealth: Payer: Self-pay

## 2020-12-05 NOTE — Progress Notes (Signed)
Chronic Care Management Pharmacy Assistant   Name: Amber Avery  MRN: [id number] DOB: 1946-06-29  Reason for Encounter:Hypertension Disease State  Call. Patient Questions:  1.  Have you seen any other providers since your last visit? No  2.  Any changes in your medicines or health? No   PCP : Steele Sizer, MD  Allergies:   Allergies  Allergen Reactions  . Codeine Hives and Nausea And Vomiting    Medications: Outpatient Encounter Medications as of 12/05/2020  Medication Sig Note  . acetaminophen (TYLENOL) 500 MG tablet Take 1 tablet (500 mg total) by mouth every 6 (six) hours as needed. (Patient taking differently: Take 1,000 mg by mouth every 8 (eight) hours. )   . albuterol (ACCUNEB) 1.25 MG/3ML nebulizer solution Take 3 mLs (1.25 mg total) by nebulization 2 (two) times daily. 10/20/2018: Patient has not needed to use nebulizer  . albuterol (VENTOLIN HFA) 108 (90 Base) MCG/ACT inhaler Inhale 2 puffs into the lungs every 6 (six) hours as needed for wheezing or shortness of breath. (Patient not taking: Reported on 08/14/2020)   . allopurinol (ZYLOPRIM) 300 MG tablet Take 1 tablet (300 mg total) by mouth daily.   [name] Kitchen atorvastatin (LIPITOR) 40 MG tablet Daily for cholesterol   . benazepril (LOTENSIN) 10 MG tablet Take 1 tablet (10 mg total) by mouth daily.   . Blood Glucose Monitoring Suppl (TRUE METRIX AIR GLUCOSE METER) w/Device KIT 1 each by Does not apply route 1 day or 1 dose. Checks Fasting Blood Sugar Twice daily Diabetes mellitus with renal manifestations, controlled ([place])      Code: E11.29 06/20/2020: Pt using one touch verio meter  . budesonide (PULMICORT) 0.25 MG/2ML nebulizer solution Take 2 mLs (0.25 mg total) by nebulization 2 (two) times daily. (Patient not taking: Reported on 04/30/2020) 10/20/2018: Patient has not needed   . diclofenac Sodium (VOLTAREN) 1 % GEL Apply 4 g topically 4 (four) times daily.   . Dulaglutide (TRULICITY) 1.5 SL/3.7DS SOPN Inject 1.5 mg into the  skin once a week.   . furosemide (LASIX) 40 MG tablet Take 1 tablet by mouth once daily   . gabapentin (NEURONTIN) 300 MG capsule TAKE 1 CAPSULE BY MOUTH IN THE MORNING AND 1 IN THE EVENING AND 2 AT NIGHT   . Insulin Pen Needle 32G X 6 MM MISC 1 each by Does not apply route 4 (four) times daily. 10/20/2018: Trouble with copay  . Lancets (ONETOUCH ULTRASOFT) lancets Use as instructed   . ONETOUCH VERIO test strip USE AS DIRECTED   . potassium chloride SA (KLOR-CON) 20 MEQ tablet Take 1 tablet by mouth once daily   . triamcinolone (KENALOG) 0.1 % Apply topically.   . Triamcinolone Acetonide (TRIAMCINOLONE 0.1 % CREAM : EUCERIN) CREA Apply 1 application topically.   . triamcinolone ointment (KENALOG) 0.1 % Use BID prn   . Vitamin D, Cholecalciferol, 25 MCG (1000 UT) TABS Take 1 capsule by mouth daily.    No facility-administered encounter medications on file as of 12/05/2020.    Current Diagnosis: Patient Active Problem List   Diagnosis Date Noted  . Cellulitis of right ankle 04/12/2020  . BMI 50.0-59.9, adult ([place]) 02/06/2020  . History of gastritis 08/14/2019  . Angiodysplasia of stomach and duodenum   . Iron deficiency anemia 04/20/2018  . Anemia, unspecified 04/12/2017  . Lichen sclerosus of female genitalia 06/29/2016  . Primary osteoarthritis of both knees 04/07/2016  . Special screening for malignant neoplasms, colon   . Right thyroid  nodule 06/26/2015  . Asthma, intermittent 04/03/2015  . Benign essential HTN 04/03/2015  . Carpal tunnel syndrome 04/03/2015  . Chronic kidney disease (CKD), stage II (mild) 04/03/2015  . Controlled gout 04/03/2015  . Arteriosclerosis of coronary artery 04/03/2015  . Diabetes mellitus with renal manifestations, controlled ([place]) 04/03/2015  . Dyslipidemia 04/03/2015  . Edema extremities 04/03/2015  . Elevated sedimentation rate 04/03/2015  . Knee pain 04/03/2015  . Lumbar radiculitis 04/03/2015  . Obstructive apnea 04/03/2015  . Lumbosacral  spondylosis 04/03/2015  . Osteoarthritis, chronic 04/03/2015  . Psoriasis 04/03/2015  . Vitamin D deficiency 04/03/2015  . Varicose veins 04/03/2015  . Goiter 12/23/2014  . Degeneration of intervertebral disc of lumbar region 08/23/2014  . Corns and callosity 06/11/2009    Goals Addressed   None    Reviewed chart prior to disease state call. Spoke with patient regarding BP  Recent Office Vitals: BP Readings from Last 3 Encounters:  09/10/20 108/70  09/04/20 (!) 96/56  08/14/20 (!) 102/52   Pulse Readings from Last 3 Encounters:  09/10/20 83  09/04/20 65  08/14/20 73    Wt Readings from Last 3 Encounters:  09/10/20 268 lb 3.2 oz (121.7 kg)  09/04/20 267 lb (121.1 kg)  08/14/20 270 lb (122.5 kg)     Kidney Function Lab Results  Component Value Date/Time   CREATININE 1.16 (H) 12/14/2019 10:49 AM   CREATININE 1.06 (H) 12/08/2018 01:04 PM   GFRNONAA 47 (L) 12/14/2019 10:49 AM   GFRAA 54 (L) 12/14/2019 10:49 AM    BMP Latest Ref Rng & Units 12/14/2019 12/08/2018 04/11/2018  Glucose 65 - 99 mg/dL 119(H) 96 105(H)  BUN 7 - 25 mg/dL 43(H) 40(H) 42(H)  Creatinine 0.60 - 0.93 mg/dL 1.16(H) 1.06(H) 1.17(H)  BUN/Creat Ratio 6 - 22 (calc) 37(H) 38(H) 36(H)  Sodium 135 - 146 mmol/L 140 140 141  Potassium 3.5 - 5.3 mmol/L 4.8 4.7 4.9  Chloride 98 - 110 mmol/L 106 105 105  CO2 20 - 32 mmol/L 24 23 26   Calcium 8.6 - 10.4 mg/dL 9.3 9.7 9.6    . Current antihypertensive regimen:   Benazepril 10 mg daily (still finishing up supply of 20 mg tablets, taking 1/2 tablet daily)   Furosemide 40 mg daily   . How often are you checking your Blood Pressure? daily . Current home BP readings:  o Patient states her blood pressure ranges around 117/77. On 12/05/2020 it was 113/70. [name] Kitchen What recent interventions/DTPs have been made by any provider to improve Blood Pressure control since last CPP Visit: None ID . Any recent hospitalizations or ED visits since last visit with CPP? No . What  diet changes have been made to improve Blood Pressure Control?  o None ID . What exercise is being done to improve your Blood Pressure Control?  o Patient states she trying  to exercise as much as she can but she has a hard time finding the time to exercise.  Adherence Review: Is the patient currently on ACE/ARB medication? Yes Does the patient have >5 day gap between last estimated fill dates? Yes   Maryjean Ka  Follow-Up:  Pharmacist Review   [place] Clinical Pharmacist Assistant [phone]

## 2020-12-10 ENCOUNTER — Other Ambulatory Visit: Payer: Self-pay | Admitting: Family Medicine

## 2020-12-10 DIAGNOSIS — N6320 Unspecified lump in the left breast, unspecified quadrant: Secondary | ICD-10-CM

## 2020-12-10 NOTE — Telephone Encounter (Signed)
Pt.notified

## 2020-12-11 NOTE — Progress Notes (Signed)
Name: Amber Avery   MRN: [id number]    DOB: April 19, 1946   Date:12/13/2020       Progress Note  Subjective  Chief Complaint  Follow up   HPI    Breast pain: she states pain not as severe, but still has a pulling sensation  She states usually notices when she wakes up in the morning and has been laying on right lateral decubitus, pain goes from her back to the anterior chest, described a dull sensation, intermittent, not burning or tingling. She has also noticed sharp pain under the left nipple when watching TV. She states her dog jumps usually on her left breast. Symptoms started two months and is intermittent , about a couple of times a week. No nipple discharge , no rashes on breast. She will have studies done next week.   HTN: taking benazepril, and bp has been towards low end of normal, she denies chest pain or  Palpitation,, she gets dizzy when she stoops down.  She is now on benazepril down from 20 mg to 10 mg per day and we will go down to 5 mg, she can cut down dose to 2.5 mg if bp remains below 120/70  Dyslipidemia: taking statin therapy, no side effects, no chest painor myalgia. Last LDL 67, HDL was at goal at 52 , time to recheck labs   AsthmaMild intermittent: she states she has been doing well, no wheezing, cough , mild SOB with activity but likely multifactorial    DM with renal manifestation: HgbA1C has been stable at  6.8 % , back on a healthier diet. She is on Trulicity, no polyphagia, polyuria (stable because of diuretic) denies  polydipsia .She has dyslipidemia and is doing well on statin therapy , CKI and is on low dose ACE, also has neuropathy that is stable on gabapentin   Obesity:she lost more weight since last visit , she has a FitBit , she has been getting 4 thousand steps daily   OSA: she wear machine every night, she uses oxygen with CPAP and is compliant , she needs a new machine   OA : shesaw Ortho, Dr. Claretha Cooper he advised her not to have  surgery because of her weight, she had one round of hyaluronic injection without help and it was too expensive. She is still taking Tylenol Shestill has intermittent right kneeeffusion when more active, no redness or increase in warmth. She has a hoveround, uses walker and sometimes cane.She is still trying to keep herself mobile    Iron deficiency anemia:last level was better, under the care of Dr. Marlis Edelson had 5 iron infusions,normal HCT 11/2019, she has not been back since 2019, Dr. Allen Norris - GI in 2020    Senile purpura: stable   Urinary incontinence: she states symptoms stable, just has to rush in am, sometimes has an accident - cannot make it to the bathroom occasionally when she gets up in am's   Patient Active Problem List   Diagnosis Date Noted  . Gait instability 12/13/2020  . Stage 3a chronic kidney disease ([place]) 12/13/2020  . Cellulitis of right ankle 04/12/2020  . Morbid obesity with BMI of 45.0-49.9, adult ([place]) 02/06/2020  . History of gastritis 08/14/2019  . Angiodysplasia of stomach and duodenum   . Iron deficiency anemia 04/20/2018  . Anemia, unspecified 04/12/2017  . Lichen sclerosus of female genitalia 06/29/2016  . Primary osteoarthritis of both knees 04/07/2016  . Special screening for malignant neoplasms, colon   . Right thyroid nodule  06/26/2015  . Asthma, intermittent 04/03/2015  . Benign essential HTN 04/03/2015  . Carpal tunnel syndrome 04/03/2015  . Chronic kidney disease (CKD), stage II (mild) 04/03/2015  . Controlled gout 04/03/2015  . Arteriosclerosis of coronary artery 04/03/2015  . Diabetes mellitus with renal manifestations, controlled ([place]) 04/03/2015  . Dyslipidemia 04/03/2015  . Edema extremities 04/03/2015  . Elevated sedimentation rate 04/03/2015  . Knee pain 04/03/2015  . Lumbar radiculitis 04/03/2015  . Obstructive apnea 04/03/2015  . Lumbosacral spondylosis 04/03/2015  . Osteoarthritis, chronic 04/03/2015  . Psoriasis 04/03/2015   . Vitamin D deficiency 04/03/2015  . Varicose veins 04/03/2015  . Goiter 12/23/2014  . Degeneration of intervertebral disc of lumbar region 08/23/2014  . Corns and callosity 06/11/2009    Past Surgical History:  Procedure Laterality Date  . APPENDECTOMY    . CATARACT EXTRACTION W/PHACO Left 08/14/2020   Procedure: CATARACT EXTRACTION PHACO AND INTRAOCULAR LENS PLACEMENT ([facility]) LEFT DIABETIC 3.56 00:59.6 6.0%;  Surgeon: Leandrew Koyanagi, MD;  Location: [place];  Service: Ophthalmology;  Laterality: Left;  Diabetic  . CATARACT EXTRACTION W/PHACO Right 09/04/2020   Procedure: CATARACT EXTRACTION PHACO AND INTRAOCULAR LENS PLACEMENT ([place]) RIGHT DIABETIC;  Surgeon: Leandrew Koyanagi, MD;  Location: [place];  Service: Ophthalmology;  Laterality: Right;  4.72 1:03.2 7.4%  . CHOLECYSTECTOMY  1977  . COLONOSCOPY WITH PROPOFOL N/A 01/21/2016   Procedure: COLONOSCOPY WITH PROPOFOL;  Surgeon: Lucilla Lame, MD;  Location: [facility] ENDOSCOPY;  Service: Endoscopy;  Laterality: N/A;  . DILATION AND CURETTAGE OF UTERUS     Due to Amenorrhea  . ESOPHAGOGASTRODUODENOSCOPY (EGD) WITH PROPOFOL N/A 08/16/2018   Procedure: ESOPHAGOGASTRODUODENOSCOPY (EGD) WITH PROPOFOL;  Surgeon: Lucilla Lame, MD;  Location: [facility] ENDOSCOPY;  Service: Endoscopy;  Laterality: N/A;  . [place]  . HEMORROIDECTOMY    . HERNIA REPAIR  2011  . Temporal Area Excision Biopsy     For Birth Mark Changes, Negative Pathology  . TONSILLECTOMY AND ADENOIDECTOMY      Family History  Problem Relation Age of Onset  . Cancer Mother        brain tumor  . Kidney disease Mother   . Hypertension Mother   . Heart disease Father   . COPD Father   . Hypertension Sister   . Arthritis Sister   . Cancer Sister   . Heart murmur Sister   . GER disease Sister   . Lupus Sister   . Diabetes Sister   . Arthritis Sister   . Osteopenia Sister   . Heart disease Sister   . Hypertension Sister   . Breast  cancer Maternal Aunt 70  . Leukemia Maternal Aunt     Social History   Tobacco Use  . Smoking status: Former Smoker    Packs/day: 2.00    Years: 15.00    Pack years: 30.00    Types: Cigarettes    Quit date: 1983    Years since quitting: 39.1  . Smokeless tobacco: Never Used  . Tobacco comment: smoking cessation materials not required  Substance Use Topics  . Alcohol use: Not Currently    Alcohol/week: 0.0 standard drinks     Current Outpatient Medications:  .  acetaminophen (TYLENOL) 500 MG tablet, Take 1 tablet (500 mg total) by mouth every 6 (six) hours as needed. (Patient taking differently: Take 1,000 mg by mouth every 8 (eight) hours.), Disp: 480 tablet, Rfl: 0 .  allopurinol (ZYLOPRIM) 300 MG tablet, Take 1 tablet (300 mg total) by mouth daily.,  Disp: 90 tablet, Rfl: 3 .  atorvastatin (LIPITOR) 40 MG tablet, Daily for cholesterol, Disp: 90 tablet, Rfl: 3 .  Blood Glucose Monitoring Suppl (TRUE METRIX AIR GLUCOSE METER) w/Device KIT, 1 each by Does not apply route 1 day or 1 dose. Checks Fasting Blood Sugar Twice daily Diabetes mellitus with renal manifestations, controlled ([place])      Code: E11.29, Disp: 1 kit, Rfl: 0 .  diclofenac Sodium (VOLTAREN) 1 % GEL, Apply 4 g topically 4 (four) times daily., Disp: 100 g, Rfl: 5 .  Dulaglutide (TRULICITY) 1.5 VE/7.2CN SOPN, Inject 1.5 mg into the skin once a week., Disp: 12 pen, Rfl: 3 .  furosemide (LASIX) 40 MG tablet, Take 1 tablet by mouth once daily, Disp: 90 tablet, Rfl: 1 .  gabapentin (NEURONTIN) 300 MG capsule, TAKE 1 CAPSULE BY MOUTH IN THE MORNING AND 1 IN THE EVENING AND 2 AT NIGHT, Disp: 360 capsule, Rfl: 3 .  Insulin Pen Needle 32G X 6 MM MISC, 1 each by Does not apply route 4 (four) times daily., Disp: 200 each, Rfl: 2 .  Lancets (ONETOUCH ULTRASOFT) lancets, Use as instructed, Disp: 100 each, Rfl: 12 .  ONETOUCH VERIO test strip, USE AS DIRECTED, Disp: 50 each, Rfl: 0 .  potassium chloride SA (KLOR-CON) 20 MEQ tablet,  Take 1 tablet by mouth once daily, Disp: 90 tablet, Rfl: 0 .  triamcinolone (KENALOG) 0.1 %, Apply topically., Disp: , Rfl:  .  Triamcinolone Acetonide (TRIAMCINOLONE 0.1 % CREAM : EUCERIN) CREA, Apply 1 application topically., Disp: , Rfl:  .  triamcinolone ointment (KENALOG) 0.1 %, Use BID prn, Disp: 90 g, Rfl: 1 .  Vitamin D, Cholecalciferol, 25 MCG (1000 UT) TABS, Take 1 capsule by mouth daily., Disp: 90 tablet, Rfl: 1 .  albuterol (ACCUNEB) 1.25 MG/3ML nebulizer solution, Take 3 mLs (1.25 mg total) by nebulization 2 (two) times daily. (Patient not taking: Reported on 12/13/2020), Disp: 75 mL, Rfl: 2 .  albuterol (VENTOLIN HFA) 108 (90 Base) MCG/ACT inhaler, Inhale 2 puffs into the lungs every 6 (six) hours as needed for wheezing or shortness of breath. (Patient not taking: No sig reported), Disp: 8 g, Rfl: 0 .  benazepril (LOTENSIN) 5 MG tablet, Take 1 tablet (5 mg total) by mouth daily., Disp: 90 tablet, Rfl: 0 .  budesonide (PULMICORT) 0.25 MG/2ML nebulizer solution, Take 2 mLs (0.25 mg total) by nebulization 2 (two) times daily. (Patient not taking: Reported on 12/13/2020), Disp: 60 mL, Rfl: 2  Allergies  Allergen Reactions  . Codeine Hives and Nausea And Vomiting    I personally reviewed active problem list, medication list, allergies, family history, social history, health maintenance with the patient/caregiver today.   ROS  Constitutional: Negative for fever or significant weight change.  Respiratory: Negative for cough, positive for intermittent shortness of breath.   Cardiovascular: Negative for chest pain or palpitations.  Gastrointestinal: Negative for abdominal pain, no bowel changes.  Musculoskeletal: Negative for gait problem or joint swelling.  Skin: Negative for rash.  Neurological: Negative for dizziness or headache.  No other specific complaints in a complete review of systems (except as listed in HPI above).  Objective  Vitals:   12/13/20 1037  BP: 116/72   Pulse: 93  Resp: 18  Temp: 97.7 F (36.5 C)  TempSrc: Oral  SpO2: 96%  Weight: 265 lb 9.6 oz (120.5 kg)  Height: 5' 2"  (1.575 m)    Body mass index is 48.58 kg/m.  Physical Exam  Constitutional: Patient appears well-developed  and well-nourished. Obese  No distress.  HEENT: head atraumatic, normocephalic, pupils equal and reactive to light,  neck supple Cardiovascular: Normal rate, regular rhythm and normal heart sounds.  No murmur heard. No BLE edema. Pulmonary/Chest: Effort normal and breath sounds normal. No respiratory distress. Abdominal: Soft.  There is no tenderness. Muscular skeletal: crepitus with extension of both knees  Psychiatric: Patient has a normal mood and affect. behavior is normal. Judgment and thought content normal.  Recent Results (from the past 2160 hour(s))  POCT HgB A1C     Status: Abnormal   Collection Time: 12/13/20 10:44 AM  Result Value Ref Range   Hemoglobin A1C 6.8 (A) 4.0 - 5.6 %   HbA1c POC (<> result, manual entry)     HbA1c, POC (prediabetic range)     HbA1c, POC (controlled diabetic range)       PHQ2/9: Depression screen [facility] 2/9 12/13/2020 09/10/2020 08/12/2020 06/20/2020 04/12/2020  Decreased Interest 0 0 0 0 0  Down, Depressed, Hopeless 0 0 0 0 0  PHQ - 2 Score 0 0 0 0 0  Altered sleeping - 0 - - 0  Tired, decreased energy - 1 - - 0  Change in appetite - 0 - - 0  Feeling bad or failure about yourself  - 0 - - 0  Trouble concentrating - 0 - - 0  Moving slowly or fidgety/restless - 0 - - 0  Suicidal thoughts - 0 - - 0  PHQ-9 Score - 1 - - 0  Difficult doing work/chores - Not difficult at all - - -  Some recent data might be hidden    phq 9 is negative   Fall Risk: Fall Risk  12/13/2020 08/12/2020 06/20/2020 04/12/2020 08/14/2019  Falls in the past year? 0 0 0 0 0  Number falls in past yr: 0 0 0 0 0  Injury with Fall? 0 0 0 0 0  Risk for fall due to : - - Impaired mobility;Impaired balance/gait - -  Risk for fall due to: Comment -  - - - -  Follow up - - Falls prevention discussed - -     Functional Status Survey: Is the patient deaf or have difficulty hearing?: No Does the patient have difficulty seeing, even when wearing glasses/contacts?: No Does the patient have difficulty concentrating, remembering, or making decisions?: No Does the patient have difficulty walking or climbing stairs?: Yes Does the patient have difficulty dressing or bathing?: No Does the patient have difficulty doing errands alone such as visiting a doctor's office or shopping?: No    Assessment & Plan  1. Controlled type 2 diabetes mellitus with stage 3 chronic kidney disease, without long-term current use of insulin (HCC)  - POCT HgB A1C  2. Benign essential HTN  - benazepril (LOTENSIN) 5 MG tablet; Take 1 tablet (5 mg total) by mouth daily.  Dispense: 90 tablet; Refill: 0  3. Stage 3a chronic kidney disease (HCC)  - benazepril (LOTENSIN) 5 MG tablet; Take 1 tablet (5 mg total) by mouth daily.  Dispense: 90 tablet; Refill: 0  4. Osteoarthritis, chronic  Hands and knees   5. Senile purpura (HCC)  Stable   6. B12 deficiency  Continue supplementation   7. Asthma, mild intermittent, well-controlled  Stable   8. Dyslipidemia  On statin therapy   9. Gait instability   10. Morbid obesity with BMI of 45.0-49.9, adult [facility])  Discussed with the patient the risk posed by an increased BMI. Discussed importance of portion  control, calorie counting and at least 150 minutes of physical activity weekly. Avoid sweet beverages and drink more water. Eat at least 6 servings of fruit and vegetables daily   11. Controlled gout  On allopurinol   12. Hyperlipidemia associated with type 2 diabetes mellitus ([place])   13. Obstructive apnea

## 2020-12-13 ENCOUNTER — Other Ambulatory Visit: Payer: Self-pay

## 2020-12-13 ENCOUNTER — Ambulatory Visit (INDEPENDENT_AMBULATORY_CARE_PROVIDER_SITE_OTHER): Payer: Medicare Other | Admitting: Family Medicine

## 2020-12-13 ENCOUNTER — Encounter: Payer: Self-pay | Admitting: Family Medicine

## 2020-12-13 VITALS — BP 116/72 | HR 93 | Temp 97.7°F | Resp 18 | Ht 62.0 in | Wt 265.6 lb

## 2020-12-13 DIAGNOSIS — J4520 Mild intermittent asthma, uncomplicated: Secondary | ICD-10-CM | POA: Diagnosis not present

## 2020-12-13 DIAGNOSIS — M109 Gout, unspecified: Secondary | ICD-10-CM | POA: Diagnosis not present

## 2020-12-13 DIAGNOSIS — N1831 Chronic kidney disease, stage 3a: Secondary | ICD-10-CM | POA: Diagnosis not present

## 2020-12-13 DIAGNOSIS — R2681 Unsteadiness on feet: Secondary | ICD-10-CM | POA: Diagnosis not present

## 2020-12-13 DIAGNOSIS — G4733 Obstructive sleep apnea (adult) (pediatric): Secondary | ICD-10-CM

## 2020-12-13 DIAGNOSIS — Z862 Personal history of diseases of the blood and blood-forming organs and certain disorders involving the immune mechanism: Secondary | ICD-10-CM

## 2020-12-13 DIAGNOSIS — Z6841 Body Mass Index (BMI) 40.0 and over, adult: Secondary | ICD-10-CM

## 2020-12-13 DIAGNOSIS — E1122 Type 2 diabetes mellitus with diabetic chronic kidney disease: Secondary | ICD-10-CM

## 2020-12-13 DIAGNOSIS — E1169 Type 2 diabetes mellitus with other specified complication: Secondary | ICD-10-CM

## 2020-12-13 DIAGNOSIS — E785 Hyperlipidemia, unspecified: Secondary | ICD-10-CM

## 2020-12-13 DIAGNOSIS — E538 Deficiency of other specified B group vitamins: Secondary | ICD-10-CM

## 2020-12-13 DIAGNOSIS — N183 Chronic kidney disease, stage 3 unspecified: Secondary | ICD-10-CM | POA: Diagnosis not present

## 2020-12-13 DIAGNOSIS — M1990 Unspecified osteoarthritis, unspecified site: Secondary | ICD-10-CM | POA: Diagnosis not present

## 2020-12-13 DIAGNOSIS — I10 Essential (primary) hypertension: Secondary | ICD-10-CM

## 2020-12-13 DIAGNOSIS — D692 Other nonthrombocytopenic purpura: Secondary | ICD-10-CM

## 2020-12-13 DIAGNOSIS — E559 Vitamin D deficiency, unspecified: Secondary | ICD-10-CM

## 2020-12-13 LAB — POCT GLYCOSYLATED HEMOGLOBIN (HGB A1C): Hemoglobin A1C: 6.8 % — AB (ref 4.0–5.6)

## 2020-12-13 MED ORDER — BENAZEPRIL HCL 5 MG PO TABS: 5.0000 mg | ORAL_TABLET | Freq: Every day | ORAL | 0 refills | Status: DC

## 2020-12-14 LAB — COMPLETE METABOLIC PANEL WITHOUT GFR
AG Ratio: 1.5 (calc) (ref 1.0–2.5)
ALT: 28 U/L (ref 6–29)
AST: 34 U/L (ref 10–35)
Albumin: 4.3 g/dL (ref 3.6–5.1)
Alkaline phosphatase (APISO): 89 U/L (ref 37–153)
BUN/Creatinine Ratio: 23 (calc) — ABNORMAL HIGH (ref 6–22)
BUN: 27 mg/dL — ABNORMAL HIGH (ref 7–25)
CO2: 28 mmol/L (ref 20–32)
Calcium: 10.3 mg/dL (ref 8.6–10.4)
Chloride: 105 mmol/L (ref 98–110)
Creat: 1.18 mg/dL — ABNORMAL HIGH (ref 0.60–0.93)
GFR, Est African American: 53 mL/min/{1.73_m2} — ABNORMAL LOW
GFR, Est Non African American: 46 mL/min/{1.73_m2} — ABNORMAL LOW
Globulin: 2.8 g/dL (ref 1.9–3.7)
Glucose, Bld: 110 mg/dL — ABNORMAL HIGH (ref 65–99)
Potassium: 5.2 mmol/L (ref 3.5–5.3)
Sodium: 142 mmol/L (ref 135–146)
Total Bilirubin: 0.7 mg/dL (ref 0.2–1.2)
Total Protein: 7.1 g/dL (ref 6.1–8.1)

## 2020-12-14 LAB — IRON,TIBC AND FERRITIN PANEL
%SAT: 17 % (ref 16–45)
Ferritin: 67 ng/mL (ref 16–288)
Iron: 70 ug/dL (ref 45–160)
TIBC: 409 ug/dL (ref 250–450)

## 2020-12-14 LAB — CBC WITH DIFFERENTIAL/PLATELET
Absolute Monocytes: 799 {cells}/uL (ref 200–950)
Basophils Absolute: 49 {cells}/uL (ref 0–200)
Basophils Relative: 0.8 %
Eosinophils Absolute: 220 {cells}/uL (ref 15–500)
Eosinophils Relative: 3.6 %
HCT: 38.9 % (ref 35.0–45.0)
Hemoglobin: 13 g/dL (ref 11.7–15.5)
Lymphs Abs: 1360 {cells}/uL (ref 850–3900)
MCH: 32.2 pg (ref 27.0–33.0)
MCHC: 33.4 g/dL (ref 32.0–36.0)
MCV: 96.3 fL (ref 80.0–100.0)
MPV: 11.1 fL (ref 7.5–12.5)
Monocytes Relative: 13.1 %
Neutro Abs: 3672 {cells}/uL (ref 1500–7800)
Neutrophils Relative %: 60.2 %
Platelets: 208 10*3/uL (ref 140–400)
RBC: 4.04 Million/uL (ref 3.80–5.10)
RDW: 13.1 % (ref 11.0–15.0)
Total Lymphocyte: 22.3 %
WBC: 6.1 10*3/uL (ref 3.8–10.8)

## 2020-12-14 LAB — VITAMIN D 25 HYDROXY (VIT D DEFICIENCY, FRACTURES): Vit D, 25-Hydroxy: 39 ng/mL (ref 30–100)

## 2020-12-14 LAB — MICROALBUMIN / CREATININE URINE RATIO
Creatinine, Urine: 10 mg/dL — ABNORMAL LOW (ref 20–275)
Microalb, Ur: <0.2 mg/dL

## 2020-12-14 LAB — LIPID PANEL
Cholesterol: 133 mg/dL
HDL: 52 mg/dL
LDL Cholesterol (Calc): 60 mg/dL
Non-HDL Cholesterol (Calc): 81 mg/dL
Total CHOL/HDL Ratio: 2.6 (calc)
Triglycerides: 130 mg/dL

## 2020-12-14 LAB — VITAMIN B12: Vitamin B-12: >2000 pg/mL — ABNORMAL HIGH (ref 200–1100)

## 2020-12-16 DIAGNOSIS — J449 Chronic obstructive pulmonary disease, unspecified: Secondary | ICD-10-CM | POA: Diagnosis not present

## 2020-12-18 ENCOUNTER — Ambulatory Visit
Admission: RE | Admit: 2020-12-18 | Discharge: 2020-12-18 | Disposition: A | Discharge status: Home / Self Care | Payer: Medicare Other | Source: Ambulatory Visit | Attending: Family Medicine | Admitting: Family Medicine

## 2020-12-18 ENCOUNTER — Other Ambulatory Visit: Payer: Self-pay

## 2020-12-18 DIAGNOSIS — N6325 Unspecified lump in the left breast, overlapping quadrants: Secondary | ICD-10-CM | POA: Diagnosis present

## 2020-12-18 DIAGNOSIS — R928 Other abnormal and inconclusive findings on diagnostic imaging of breast: Secondary | ICD-10-CM | POA: Diagnosis not present

## 2020-12-18 DIAGNOSIS — N6489 Other specified disorders of breast: Secondary | ICD-10-CM | POA: Diagnosis not present

## 2020-12-20 ENCOUNTER — Other Ambulatory Visit: Payer: Self-pay | Admitting: Family Medicine

## 2020-12-20 DIAGNOSIS — L409 Psoriasis, unspecified: Secondary | ICD-10-CM

## 2020-12-25 DIAGNOSIS — I10 Essential (primary) hypertension: Secondary | ICD-10-CM | POA: Diagnosis not present

## 2020-12-25 DIAGNOSIS — F5101 Primary insomnia: Secondary | ICD-10-CM | POA: Diagnosis not present

## 2020-12-25 DIAGNOSIS — G4733 Obstructive sleep apnea (adult) (pediatric): Secondary | ICD-10-CM | POA: Diagnosis not present

## 2021-01-05 ENCOUNTER — Other Ambulatory Visit: Payer: Self-pay | Admitting: Family Medicine

## 2021-01-05 DIAGNOSIS — I10 Essential (primary) hypertension: Secondary | ICD-10-CM

## 2021-01-05 NOTE — Telephone Encounter (Signed)
Requested Prescriptions  Pending Prescriptions Disp Refills  . potassium chloride SA (KLOR-CON) 20 MEQ tablet [Pharmacy Med Name: Potassium Chloride Crys ER 20 MEQ Oral Tablet Extended Release] 90 tablet 0    Sig: Take 1 tablet by mouth once daily     Endocrinology:  Minerals - Potassium Supplementation Failed - 01/05/2021 12:08 PM      Failed - Cr in normal range and within 360 days    Creat  Date Value Ref Range Status  12/13/2020 1.18 (H) 0.60 - 0.93 mg/dL Final    Comment:    For patients >64 years of age, the reference limit for Creatinine is approximately 13% higher for people identified as African-American. .    Creatinine, Urine  Date Value Ref Range Status  12/13/2020 10 (L) 20 - 275 mg/dL Final         Passed - K in normal range and within 360 days    Potassium  Date Value Ref Range Status  12/13/2020 5.2 3.5 - 5.3 mmol/L Final         Passed - Valid encounter within last 12 months    Recent Outpatient Visits          3 weeks ago Controlled type 2 diabetes mellitus with stage 3 chronic kidney disease, without long-term current use of insulin [facility])   [facility] [place], Drue Stager, MD   3 months ago Benign essential HTN   [facility] [place], Drue Stager, MD   4 months ago Controlled type 2 diabetes mellitus with stage 3 chronic kidney disease, without long-term current use of insulin [facility])   [facility] [place], Drue Stager, MD   8 months ago Controlled type 2 diabetes mellitus with stage 3 chronic kidney disease, without long-term current use of insulin [facility])   [facility] [place], Drue Stager, MD   1 year ago Controlled type 2 diabetes mellitus with stage 3 chronic kidney disease, without long-term current use of insulin [facility])   [facility] Steele Sizer, MD      Future Appointments            In 3 months Ancil Boozer, Drue Stager, MD [facility], [place]   In 5 months   [place]

## 2021-01-14 DIAGNOSIS — J449 Chronic obstructive pulmonary disease, unspecified: Secondary | ICD-10-CM | POA: Diagnosis not present

## 2021-01-27 ENCOUNTER — Other Ambulatory Visit: Payer: Self-pay | Admitting: Family Medicine

## 2021-01-27 DIAGNOSIS — M1990 Unspecified osteoarthritis, unspecified site: Secondary | ICD-10-CM

## 2021-02-06 ENCOUNTER — Telehealth: Payer: Self-pay

## 2021-02-06 NOTE — Progress Notes (Signed)
Chronic Care Management Pharmacy Assistant   Name: Amber Avery  MRN: [id number] DOB: 07/17/1946  Reason for Encounter:Diabetes Disease State Call.   Recent office visits:  12/13/2020 Dr.Sowles MD Decrease Benazepril 10 mg to 5 mg ,she can cut down dose to 2.5 mg if bp remains below 120/70  Recent consult visits:  No recent [facility] visits:  None in previous 6 months  Medications: Outpatient Encounter Medications as of 02/06/2021  Medication Sig Note  . acetaminophen (TYLENOL) 500 MG tablet Take 1 tablet (500 mg total) by mouth every 6 (six) hours as needed. (Patient taking differently: Take 1,000 mg by mouth every 8 (eight) hours.)   . albuterol (ACCUNEB) 1.25 MG/3ML nebulizer solution Take 3 mLs (1.25 mg total) by nebulization 2 (two) times daily. (Patient not taking: Reported on 12/13/2020) 10/20/2018: Patient has not needed to use nebulizer  . albuterol (VENTOLIN HFA) 108 (90 Base) MCG/ACT inhaler Inhale 2 puffs into the lungs every 6 (six) hours as needed for wheezing or shortness of breath. (Patient not taking: No sig reported)   . allopurinol (ZYLOPRIM) 300 MG tablet Take 1 tablet (300 mg total) by mouth daily.   [name] Kitchen atorvastatin (LIPITOR) 40 MG tablet Daily for cholesterol   . benazepril (LOTENSIN) 5 MG tablet Take 1 tablet (5 mg total) by mouth daily.   . Blood Glucose Monitoring Suppl (TRUE METRIX AIR GLUCOSE METER) w/Device KIT 1 each by Does not apply route 1 day or 1 dose. Checks Fasting Blood Sugar Twice daily Diabetes mellitus with renal manifestations, controlled ([place])      Code: E11.29 06/20/2020: Pt using one touch verio meter  . budesonide (PULMICORT) 0.25 MG/2ML nebulizer solution Take 2 mLs (0.25 mg total) by nebulization 2 (two) times daily. (Patient not taking: Reported on 12/13/2020) 10/20/2018: Patient has not needed   . diclofenac Sodium (VOLTAREN) 1 % GEL APPLY 4 GRAMS TOPICALLY 4 TIMES DAILY AS DIRECTED   . Dulaglutide (TRULICITY) 1.5 XN/2.3FT SOPN  Inject 1.5 mg into the skin once a week.   . furosemide (LASIX) 40 MG tablet Take 1 tablet by mouth once daily   . gabapentin (NEURONTIN) 300 MG capsule TAKE 1 CAPSULE BY MOUTH IN THE MORNING AND 1 IN THE EVENING AND 2 AT NIGHT   . Lancets (ONETOUCH ULTRASOFT) lancets Use as instructed   . ONETOUCH VERIO test strip USE AS DIRECTED   . potassium chloride SA (KLOR-CON) 20 MEQ tablet Take 1 tablet by mouth once daily   . triamcinolone (KENALOG) 0.1 % Apply topically.   . Triamcinolone Acetonide (TRIAMCINOLONE 0.1 % CREAM : EUCERIN) CREA Apply 1 application topically.   . triamcinolone ointment (KENALOG) 0.1 % APPLY TOPICALLY TWICE DAILY AS NEEDED   . Vitamin D, Cholecalciferol, 25 MCG (1000 UT) TABS Take 1 capsule by mouth daily.    No facility-administered encounter medications on file as of 02/06/2021.    Star Rating Drugs: Atorvastatin 40 mg last filled on 12/22/2020 for 90 day supply Benazepril 5 mg - (benazepril 20 mg last filled on 08/04/2020 for 90 day supply) - Patient states she has not gotten the new prescriptions because it was decrease to 5 mg and she is finishing up what she has now. Trulicity 1.5 mg (no filled indicated) Patient receives Trulicity from [employer].  Recent Relevant Labs: Lab Results  Component Value Date/Time   HGBA1C 6.8 (A) 12/13/2020 10:44 AM   HGBA1C 6.8 (A) 08/12/2020 10:59 AM   HGBA1C 6.0 04/14/2019 10:33 AM  HGBA1C 6.3 12/08/2018 11:22 AM   MICROALBUR <0.2 12/13/2020 11:44 AM   MICROALBUR 0.3 12/14/2019 10:49 AM   MICROALBUR 20 04/11/2018 10:53 AM   MICROALBUR 20 08/09/2017 09:57 AM    Kidney Function Lab Results  Component Value Date/Time   CREATININE 1.18 (H) 12/13/2020 11:44 AM   CREATININE 1.16 (H) 12/14/2019 10:49 AM   GFRNONAA 46 (L) 12/13/2020 11:44 AM   GFRAA 53 (L) 12/13/2020 11:44 AM    . Current antihyperglycemic regimen:  ? Trulicity 6.0AV inject weekly . What recent interventions/DTPs have been made to improve glycemic control:   o Patient states [place] still has her on auto fill and patient has abundance of supply.Patient reports she has 10 boxes that has 4 pens. . Have there been any recent hospitalizations or ED visits since last visit with CPP? No . Patient denies hypoglycemic symptoms, including Pale, Sweaty, Shaky, Hungry, Nervous/irritable and Vision changes . Patient reports hyperglycemic symptoms, including polyuria  o Patient states she drinks a lot of water through out the day. . How often are you checking your blood sugar?  o Patient states she has not check her blood sugar this week because she been busy with cleaning. . What are your blood sugars ranging?  o Patient states her blood sugars has been around 124,130,159,and no higher then 160. [name] Kitchen During the week, how often does your blood glucose drop below 70? Never . Are you checking your feet daily/regularly?   Patient denies numbness,pain or tingling sensations in her feet.  Adherence Review: Is the patient currently on a STATIN medication? Yes Is the patient currently on ACE/ARB medication? Yes Does the patient have >5 day gap between last estimated fill dates? No  Patient stated she receive another shipment of medications were sent to PCP office. Patient aware I am calling [facility] to request auto refills to be stop.Patient states she will call [facility] when she needs a refill. Per [facility], he is unable to stop the auto refill at this time due to no order is open, and all orders has been completed.Per [facility], he will put a note to request the auto refill to be stop ,and it should take care of the issue. I informed [facility] per patient she will contact them for further refill.  Patient states her blood pressure has been good . On 02/06/2021 it was 119/74 with a pulse of 76.  [facility] Pharmacist Assistant [phone]

## 2021-02-14 DIAGNOSIS — J449 Chronic obstructive pulmonary disease, unspecified: Secondary | ICD-10-CM | POA: Diagnosis not present

## 2021-03-03 ENCOUNTER — Other Ambulatory Visit: Payer: Self-pay | Admitting: Family Medicine

## 2021-03-03 DIAGNOSIS — N1831 Chronic kidney disease, stage 3a: Secondary | ICD-10-CM

## 2021-03-03 DIAGNOSIS — I10 Essential (primary) hypertension: Secondary | ICD-10-CM

## 2021-03-03 MED ORDER — BENAZEPRIL HCL 5 MG PO TABS: 5.0000 mg | ORAL_TABLET | Freq: Every day | ORAL | 0 refills | Status: DC

## 2021-03-03 NOTE — Telephone Encounter (Signed)
Requested Prescriptions  Pending Prescriptions Disp Refills  . benazepril (LOTENSIN) 5 MG tablet 90 tablet 0    Sig: Take 1 tablet (5 mg total) by mouth daily.     Cardiovascular:  ACE Inhibitors Failed - 03/03/2021  5:10 PM      Failed - Cr in normal range and within 180 days    Creat  Date Value Ref Range Status  12/13/2020 1.18 (H) 0.60 - 0.93 mg/dL Final    Comment:    For patients >74 years of age, the reference limit for Creatinine is approximately 13% higher for people identified as African-American. .    Creatinine, Urine  Date Value Ref Range Status  12/13/2020 10 (L) 20 - 275 mg/dL Final         Passed - K in normal range and within 180 days    Potassium  Date Value Ref Range Status  12/13/2020 5.2 3.5 - 5.3 mmol/L Final         Passed - Patient is not pregnant      Passed - Last BP in normal range    BP Readings from Last 1 Encounters:  12/13/20 116/72         Passed - Valid encounter within last 6 months    Recent Outpatient Visits          2 months ago Controlled type 2 diabetes mellitus with stage 3 chronic kidney disease, without long-term current use of insulin [facility])   [facility] [place], Drue Stager, MD   5 months ago Benign essential HTN   [facility] [place], Drue Stager, MD   6 months ago Controlled type 2 diabetes mellitus with stage 3 chronic kidney disease, without long-term current use of insulin [facility])   [facility] [place], Drue Stager, MD   10 months ago Controlled type 2 diabetes mellitus with stage 3 chronic kidney disease, without long-term current use of insulin [facility])   [facility] [place], Drue Stager, MD   1 year ago Controlled type 2 diabetes mellitus with stage 3 chronic kidney disease, without long-term current use of insulin [facility])   [facility] Steele Sizer, MD      Future Appointments            In 1 month Ancil Boozer, Drue Stager, MD [facility], [place]   In 3 months  [place]

## 2021-03-03 NOTE — Telephone Encounter (Signed)
Medication: benazepril (LOTENSIN) 5 MG tablet [[id number]]   Has the patient contacted their pharmacy? YES (Agent: If no, request that the patient contact the pharmacy for the refill.) (Agent: If yes, when and what did the pharmacy advise?)  Preferred Pharmacy (with phone number or street name): [facility] [place], [place]) [place] [address]  Phone:  [phone] Fax:  [phone]   Agent: Please be advised that RX refills may take up to 3 business days. We ask that you follow-up with your pharmacy.

## 2021-03-10 ENCOUNTER — Telehealth: Payer: Self-pay | Admitting: Family Medicine

## 2021-03-10 NOTE — Telephone Encounter (Signed)
[place] called and spoke to [place], [facility] about the refill requested. Advised it was sent on 03/03/21 #90/0 refills. She says it was never picked up, so she will get it ready for the patient.

## 2021-03-10 NOTE — Telephone Encounter (Signed)
Copied from [place] [id number]. Topic: Quick Communication - Rx Refill/Question >> Mar 10, 2021 10:49 AM Yvette Rack wrote: Medication: benazepril (LOTENSIN) 5 MG tablet  Has the patient contacted their pharmacy? yes - Pt stated she was told the Rx has not been received  Preferred Pharmacy (with phone number or street name): [facility] [place]),  - [place]  Phone: [phone]  Fax: [phone]  Agent: Please be advised that RX refills may take up to 3 business days. We ask that you follow-up with your pharmacy.

## 2021-03-16 DIAGNOSIS — J449 Chronic obstructive pulmonary disease, unspecified: Secondary | ICD-10-CM | POA: Diagnosis not present

## 2021-03-19 ENCOUNTER — Other Ambulatory Visit: Payer: Self-pay | Admitting: Family Medicine

## 2021-03-19 DIAGNOSIS — M1990 Unspecified osteoarthritis, unspecified site: Secondary | ICD-10-CM

## 2021-03-30 ENCOUNTER — Other Ambulatory Visit: Payer: Self-pay | Admitting: Family Medicine

## 2021-03-30 DIAGNOSIS — I10 Essential (primary) hypertension: Secondary | ICD-10-CM

## 2021-03-30 NOTE — Telephone Encounter (Signed)
Requested Prescriptions  Pending Prescriptions Disp Refills  . KLOR-CON M20 20 MEQ tablet [Pharmacy Med Name: [place]-Con M20 20 MEQ Oral Tablet Extended Release] 90 tablet 0    Sig: Take 1 tablet by mouth once daily     Endocrinology:  Minerals - Potassium Supplementation Failed - 03/30/2021  8:43 AM      Failed - Cr in normal range and within 360 days    Creat  Date Value Ref Range Status  12/13/2020 1.18 (H) 0.60 - 0.93 mg/dL Final    Comment:    For patients >30 years of age, the reference limit for Creatinine is approximately 13% higher for people identified as African-American. .    Creatinine, Urine  Date Value Ref Range Status  12/13/2020 10 (L) 20 - 275 mg/dL Final         Passed - K in normal range and within 360 days    Potassium  Date Value Ref Range Status  12/13/2020 5.2 3.5 - 5.3 mmol/L Final         Passed - Valid encounter within last 12 months    Recent Outpatient Visits          3 months ago Controlled type 2 diabetes mellitus with stage 3 chronic kidney disease, without long-term current use of insulin [facility])   [facility] [place], Drue Stager, MD   6 months ago Benign essential HTN   [facility] [place], Drue Stager, MD   7 months ago Controlled type 2 diabetes mellitus with stage 3 chronic kidney disease, without long-term current use of insulin [facility])   [facility] [place], Drue Stager, MD   11 months ago Controlled type 2 diabetes mellitus with stage 3 chronic kidney disease, without long-term current use of insulin [facility])   [facility] [place], Drue Stager, MD   1 year ago Controlled type 2 diabetes mellitus with stage 3 chronic kidney disease, without long-term current use of insulin [facility])   [facility] Steele Sizer, MD      Future Appointments            In 2 weeks Ancil Boozer, Drue Stager, MD [facility], [place]   In 2 months  [facility],  [place]           . ONETOUCH VERIO test strip [employer] Med Name: OneTouch Verio In Vitro Strip] 59 each 1    Sig: USE AS DIRECTED     Endocrinology: Diabetes - Testing Supplies Passed - 03/30/2021  8:43 AM      Passed - Valid encounter within last 12 months    Recent Outpatient Visits          3 months ago Controlled type 2 diabetes mellitus with stage 3 chronic kidney disease, without long-term current use of insulin [facility])   [facility] [place], Drue Stager, MD   6 months ago Benign essential HTN   [facility] [place], Drue Stager, MD   7 months ago Controlled type 2 diabetes mellitus with stage 3 chronic kidney disease, without long-term current use of insulin [facility])   [facility] Steele Sizer, MD   11 months ago Controlled type 2 diabetes mellitus with stage 3 chronic kidney disease, without long-term current use of insulin [facility])   [facility] [place], Drue Stager, MD   1 year ago Controlled type 2 diabetes mellitus with stage 3 chronic kidney disease, without long-term current use of insulin ([place])  [facility] Steele Sizer, MD      Future Appointments            In 2 weeks Ancil Boozer, Drue Stager, MD [facility], [facility]   In 2 months  [place]

## 2021-04-02 ENCOUNTER — Telehealth: Payer: Self-pay

## 2021-04-02 NOTE — Chronic Care Management (AMB) (Signed)
    Chronic Care Management Pharmacy Assistant   Name: Amber Avery  MRN: [id number] DOB: 01-Nov-1945  04/02/2021- Patient called to remind of appointment with Junius Argyle, CPP on 04/03/2021 @ 1030.  Patient aware of appointment date, time, and type of appointment (either telephone or in person). Patient aware to have/bring all medications, supplements, blood pressure and/or blood sugar logs to visit.  Questions: Have you had any recent office visit or specialist visit outside of [place]? No  Are there any concerns you would like to discuss during your office visit? Nothing she can think of. She is having hip pain and needs an injection but they have not been able to get her an appointment at this time she is waiting on the office to contact her for that appointment.  Are you having any problems obtaining your medications? No  If patient has any PAP medications ask if they are having any problems getting their PAP medication or refill? Trulicity 1.5 mg  Star Rating Drug: 03/18/2021 Atorvastatin 40 mg 90-Day supply with [place]  03/10/2021 Benazepril 5 mg 90-Day supply with [place]  Trulicity 1.5 mg (patient receives this medication through PAP program she reports she has plenty available)  Any gaps in medications fill history? None   [name], [place] Pharmacist Assistant [phone]

## 2021-04-03 ENCOUNTER — Ambulatory Visit (INDEPENDENT_AMBULATORY_CARE_PROVIDER_SITE_OTHER): Payer: Medicare Other

## 2021-04-03 DIAGNOSIS — I152 Hypertension secondary to endocrine disorders: Secondary | ICD-10-CM | POA: Diagnosis not present

## 2021-04-03 DIAGNOSIS — E1122 Type 2 diabetes mellitus with diabetic chronic kidney disease: Secondary | ICD-10-CM

## 2021-04-03 DIAGNOSIS — E1159 Type 2 diabetes mellitus with other circulatory complications: Secondary | ICD-10-CM

## 2021-04-03 DIAGNOSIS — N183 Chronic kidney disease, stage 3 unspecified: Secondary | ICD-10-CM

## 2021-04-03 DIAGNOSIS — E785 Hyperlipidemia, unspecified: Secondary | ICD-10-CM

## 2021-04-03 DIAGNOSIS — E1169 Type 2 diabetes mellitus with other specified complication: Secondary | ICD-10-CM | POA: Diagnosis not present

## 2021-04-03 NOTE — Progress Notes (Signed)
Chronic Care Management Pharmacy Note  04/04/2021 Name:  Amber Avery MRN:  [id number] DOB:  Dec 28, 1945  Summary: Patient doing well with her Trulicity, has lost a few more pounds since her last visit. She does report she has been having significant pain which has significantly limited her daily activities. She is hoping her upcoming doctor's visit will help provide relief.  Recommendations/Changes made from today's visit: Continue Current Medications  Plan: CPP Follow-up in 5 months    Subjective: Amber Avery is an 75 y.o. year old female who is a primary patient of Steele Sizer, MD.  The CCM team was consulted for assistance with disease management and care coordination needs.    Engaged with patient by telephone for follow up visit in response to provider referral for pharmacy case management and/or care coordination services.   Consent to Services:  The patient was given information about Chronic Care Management services, agreed to services, and gave verbal consent prior to initiation of services.  Please see initial visit note for detailed documentation.   Patient Care Team: Steele Sizer, MD as PCP - General (Family Medicine) Bryson Ha, OD as Consulting Physician (Optometry) Sharlet Salina, MD as Consulting Physician (Physical Medicine and Rehabilitation) Diamond Nickel, DO as Consulting Physician (Sports Medicine) Germaine Pomfret, [facility] as Pharmacist (Pharmacist)  Recent office visits: 12/13/20: Patient presented to Dr. Ancil Boozer for follow-up. Benazperil decreased to 5 mg daily.   Recent consult visits: None in previous 6 months  Hospital visits: None in previous 6 months  Objective:  Lab Results  Component Value Date   CREATININE 1.18 (H) 12/13/2020   BUN 27 (H) 12/13/2020   GFRNONAA 46 (L) 12/13/2020   GFRAA 53 (L) 12/13/2020   NA 142 12/13/2020   K 5.2 12/13/2020   CALCIUM 10.3 12/13/2020   CO2 28 12/13/2020   GLUCOSE 110 (H)  12/13/2020    Lab Results  Component Value Date/Time   HGBA1C 6.8 (A) 12/13/2020 10:44 AM   HGBA1C 6.8 (A) 08/12/2020 10:59 AM   HGBA1C 6.0 04/14/2019 10:33 AM   HGBA1C 6.3 12/08/2018 11:22 AM   MICROALBUR <0.2 12/13/2020 11:44 AM   MICROALBUR 0.3 12/14/2019 10:49 AM   MICROALBUR 20 04/11/2018 10:53 AM   MICROALBUR 20 08/09/2017 09:57 AM    Last diabetic Eye exam:  Lab Results  Component Value Date/Time   HMDIABEYEEXA No Retinopathy 06/11/2020 12:00 AM    Last diabetic Foot exam: No results found for: HMDIABFOOTEX   Lab Results  Component Value Date   CHOL 133 12/13/2020   HDL 52 12/13/2020   LDLCALC 60 12/13/2020   TRIG 130 12/13/2020   CHOLHDL 2.6 12/13/2020    Hepatic Function Latest Ref Rng & Units 12/13/2020 12/14/2019 12/08/2018  Total Protein 6.1 - 8.1 g/dL 7.1 6.6 6.7  Albumin 3.6 - 5.1 g/dL - - -  AST 10 - 35 U/L 34 22 27  ALT 6 - 29 U/L _0 Alk Phosphatase 33 - 130 U/L - - -  Total Bilirubin 0.2 - 1.2 mg/dL 0.7 0.7 0.6    Lab Results  Component Value Date/Time   TSH 4.400 12/09/2015 10:10 AM   TSH 4.380 09/18/2015 11:39 AM    CBC Latest Ref Rng & Units 12/13/2020 12/14/2019 12/08/2018  WBC 3.8 - 10.8 Thousand/uL 6.1 6.7 8.2  Hemoglobin 11.7 - 15.5 g/dL 13.0 12.3 11.7  Hematocrit 35.0 - 45.0 % 38.9 36.7 34.7(L)  Platelets 140 - 400 Thousand/uL 208 177 225  Lab Results  Component Value Date/Time   VD25OH 39 12/13/2020 11:44 AM   VD25OH 26 (L) 12/14/2019 10:49 AM    Clinical ASCVD: No  The 10-year ASCVD risk score Mikey Bussing DC Jr., et al., 2013) is: 24.4%   Values used to calculate the score:     Age: 61 years     Sex: Female     Is Non-Hispanic African American: No     Diabetic: Yes     Tobacco smoker: No     Systolic Blood Pressure: 014 mmHg     Is BP treated: Yes     HDL Cholesterol: 52 mg/dL     Total Cholesterol: 133 mg/dL    Depression screen [facility] 2/9 12/13/2020 09/10/2020 08/12/2020  Decreased Interest 0 0 0  Down, Depressed, Hopeless  0 0 0  PHQ - 2 Score 0 0 0  Altered sleeping - 0 -  Tired, decreased energy - 1 -  Change in appetite - 0 -  Feeling bad or failure about yourself  - 0 -  Trouble concentrating - 0 -  Moving slowly or fidgety/restless - 0 -  Suicidal thoughts - 0 -  PHQ-9 Score - 1 -  Difficult doing work/chores - Not difficult at all -  Some recent data might be hidden    Social History   Tobacco Use  Smoking Status Former   Packs/day: 2.00   Years: 15.00   Pack years: 30.00   Types: Cigarettes   Quit date: 1983   Years since quitting: 39.4  Smokeless Tobacco Never  Tobacco Comments   smoking cessation materials not required   BP Readings from Last 3 Encounters:  12/13/20 116/72  09/10/20 108/70  09/04/20 (!) 96/56   Pulse Readings from Last 3 Encounters:  12/13/20 93  09/10/20 83  09/04/20 65   Wt Readings from Last 3 Encounters:  12/13/20 265 lb 9.6 oz (120.5 kg)  09/10/20 268 lb 3.2 oz (121.7 kg)  09/04/20 267 lb (121.1 kg)   BMI Readings from Last 3 Encounters:  12/13/20 48.58 kg/m  09/10/20 49.05 kg/m  09/04/20 48.83 kg/m    Assessment/Interventions: Review of patient past medical history, allergies, medications, health status, including review of consultants reports, laboratory and other test data, was performed as part of comprehensive evaluation and provision of chronic care management services.   SDOH:  (Social Determinants of Health) assessments and interventions performed: Yes  SDOH Screenings   Alcohol Screen: Low Risk    Last Alcohol Screening Score (AUDIT): 0  Depression (PHQ2-9): Low Risk    PHQ-2 Score: 0  Financial Resource Strain: Low Risk    Difficulty of Paying Living Expenses: Not hard at all  Food Insecurity: No Food Insecurity   Worried About Charity fundraiser in the Last Year: Never true   Ran Out of Food in the Last Year: Never true  Housing: Low Risk    Last Housing Risk Score: 0  Physical Activity: Inactive   Days of Exercise per Week:  0 days   Minutes of Exercise per Session: 0 min  Social Connections: Unknown   Frequency of Communication with Friends and Family: Not on file   Frequency of Social Gatherings with Friends and Family: Not on file   Attends Religious Services: Not on file   Active Member of Clubs or Organizations: Not on file   Attends Archivist Meetings: Not on file   Marital Status: Divorced  Stress: No Stress Concern Present   Feeling  of Stress : Only a little  Tobacco Use: Medium Risk   Smoking Tobacco Use: Former   Smokeless Tobacco Use: Never  Transportation Needs: No Data processing manager (Medical): No   Lack of Transportation (Non-Medical): No    CCM Care Plan  Allergies  Allergen Reactions   Codeine Hives and Nausea And Vomiting    Medications Reviewed Today     Reviewed by Royal Hawthorn, CMA (Certified Medical Assistant) on 12/13/20 at 31  Med List Status: <None>   Medication Order Taking? Sig Documenting Provider Last Dose Status Informant  acetaminophen (TYLENOL) 500 MG tablet [id number] Yes Take 1 tablet (500 mg total) by mouth every 6 (six) hours as needed.  Patient taking differently: Take 1,000 mg by mouth every 8 (eight) hours.   Steele Sizer, MD Taking Active   albuterol (ACCUNEB) 1.25 MG/3ML nebulizer solution [id number] No Take 3 mLs (1.25 mg total) by nebulization 2 (two) times daily.  Patient not taking: Reported on 12/13/2020   Steele Sizer, MD Not Taking Active            Med Note [facility], Garnetta Buddy Oct 20, 2018  1:02 PM) Patient has not needed to use nebulizer  albuterol (VENTOLIN HFA) 108 (90 Base) MCG/ACT inhaler [id number] No Inhale 2 puffs into the lungs every 6 (six) hours as needed for wheezing or shortness of breath.  Patient not taking: No sig reported   Steele Sizer, MD Not Taking Active   allopurinol (ZYLOPRIM) 300 MG tablet [id number] Yes Take 1 tablet (300 mg total) by mouth daily. Steele Sizer, MD Taking  Active   atorvastatin (LIPITOR) 40 MG tablet [id number] Yes Daily for cholesterol Steele Sizer, MD Taking Active   benazepril (LOTENSIN) 10 MG tablet [id number] Yes Take 1 tablet (10 mg total) by mouth daily. Steele Sizer, MD Taking Active   Blood Glucose Monitoring Suppl (TRUE METRIX AIR GLUCOSE METER) w/Device KIT [id number] Yes 1 each by Does not apply route 1 day or 1 dose. Checks Fasting Blood Sugar Twice daily Diabetes mellitus with renal manifestations, controlled ([place])      Code: E11.29 Steele Sizer, MD Taking Active            Med Note [facility] Jun 20, 2020  2:49 PM) Pt using one touch verio meter  budesonide (PULMICORT) 0.25 MG/2ML nebulizer solution [id number] No Take 2 mLs (0.25 mg total) by nebulization 2 (two) times daily.  Patient not taking: Reported on 12/13/2020   Steele Sizer, MD Not Taking Active            Med Note [facility], Garn[place] Buddy Oct 20, 2018  1:02 PM) Patient has not needed   diclofenac Sodium (VOLTAREN) 1 % GEL [id number] Yes Apply 4 g topically 4 (four) times daily. Steele Sizer, MD Taking Active   Dulaglutide (TRULICITY) 1.5 WH/6.7RF SOPN [id number] Yes Inject 1.5 mg into the skin once a week. Steele Sizer, MD Taking Active   furosemide (LASIX) 40 MG tablet [id number] Yes Take 1 tablet by mouth once daily Steele Sizer, MD Taking Active   gabapentin (NEURONTIN) 300 MG capsule [id number] Yes TAKE 1 CAPSULE BY MOUTH IN THE MORNING AND 1 IN THE EVENING AND 2 AT Tonny Bollman, MD Taking Active   Insulin Pen Needle 32G X 6 MM MISC [id number] Yes 1 each by Does not apply route 4 (four) times daily. Steele Sizer, MD Taking Consider Medication Status and Discontinue (No  longer needed (for PRN medications))            Med Note (HEDRICK, JULIE E   Thu Oct 20, 2018 11:28 AM) Trouble with copay  Lancets [facility] ULTRASOFT) lancets [id number] Yes Use as instructed Steele Sizer, MD Taking Active   [facility] VERIO test strip [id number] Yes USE  AS DIRECTED Steele Sizer, MD Taking Active   potassium chloride SA (KLOR-CON) 20 MEQ tablet [id number] Yes Take 1 tablet by mouth once daily Steele Sizer, MD Taking Active   triamcinolone (KENALOG) 0.1 % [id number] Yes Apply topically. [provider] Taking Active   Triamcinolone Acetonide (TRIAMCINOLONE 0.1 % CREAM : EUCERIN) CREA [id number] Yes Apply 1 application topically. [provider] Taking Active   triamcinolone ointment (KENALOG) 0.1 % [id number] Yes Use BID prn Steele Sizer, MD Taking Active   Vitamin D, Cholecalciferol, 25 MCG (1000 UT) TABS [id number] Yes Take 1 capsule by mouth daily. Steele Sizer, MD Taking Active             Patient Active Problem List   Diagnosis Date Noted   Gait instability 12/13/2020   Stage 3a chronic kidney disease ([place]) 12/13/2020   Cellulitis of right ankle 04/12/2020   Morbid obesity with BMI of 45.0-49.9, adult ([place]) 02/06/2020   History of gastritis 08/14/2019   Angiodysplasia of stomach and duodenum    Iron deficiency anemia 04/20/2018   Anemia, unspecified 69/48/5462   Lichen sclerosus of female genitalia 06/29/2016   Primary osteoarthritis of both knees 04/07/2016   Special screening for malignant neoplasms, colon    Right thyroid nodule 06/26/2015   Asthma, intermittent 04/03/2015   Benign essential HTN 04/03/2015   Carpal tunnel syndrome 04/03/2015   Chronic kidney disease (CKD), stage II (mild) 04/03/2015   Controlled gout 04/03/2015   Arteriosclerosis of coronary artery 04/03/2015   Diabetes mellitus with renal manifestations, controlled ([place]) 04/03/2015   Dyslipidemia 04/03/2015   Edema extremities 04/03/2015   Elevated sedimentation rate 04/03/2015   Knee pain 04/03/2015   Lumbar radiculitis 04/03/2015   Obstructive apnea 04/03/2015   Lumbosacral spondylosis 04/03/2015   Osteoarthritis, chronic 04/03/2015   Psoriasis 04/03/2015   Vitamin D deficiency 04/03/2015   Varicose veins 04/03/2015    Goiter 12/23/2014   Degeneration of intervertebral disc of lumbar region 08/23/2014   Corns and callosity 06/11/2009    Immunization History  Administered Date(s) Administered   Fluad Quad(high Dose 65+) 06/20/2020   Influenza, High Dose Seasonal PF 06/19/2015, 06/29/2016, 06/14/2017, 06/17/2018, 06/22/2019   Influenza-Unspecified 06/17/2011, 07/11/2019   PFIZER(Purple Top)SARS-COV-2 Vaccination 12/11/2019, 01/01/2020, 07/16/2020   Pneumococcal Conjugate-13 11/24/2011, 11/23/2013, 04/09/2017   Pneumococcal Polysaccharide-23 08/30/2009, 08/30/2009   Td 08/30/2009   Tdap 08/30/2009, 08/12/2020   Zoster Recombinat (Shingrix) 07/11/2019, 09/21/2019   Zoster, Live 02/12/2011    Conditions to be addressed/monitored:  Hypertension, Hyperlipidemia, Diabetes, Asthma, Chronic Kidney Disease, Osteoarthritis, Gout, and Chronic Pain  Care Plan : General Pharmacy (Adult)  Updates made by Germaine Pomfret, RPH since 04/04/2021 12:00 AM     Problem: Hypertension, Hyperlipidemia, Diabetes, Asthma, Chronic Kidney Disease, Osteoarthritis, Gout, and Chronic Pain   Priority: High     Long-Range Goal: Patient-Specific Goal   Start Date: 04/03/2021  Expected End Date: 10/04/2021  This Visit's Progress: On track  Priority: High  Note:   Current Barriers:  Unable to independently afford treatment regimen  Pharmacist Clinical Goal(s):  Patient will verbalize ability to afford treatment regimen maintain control of diabetes as evidenced by A1c less than 8%  through collaboration  with PharmD and provider.   Interventions: 1:1 collaboration with Steele Sizer, MD regarding development and update of comprehensive plan of care as evidenced by provider attestation and co-signature Inter-disciplinary care team collaboration (see longitudinal plan of care) Comprehensive medication review performed; medication list updated in electronic medical record  Hypertension (BP goal  <140/90) -Controlled -Current treatment: Furosemide 40 mg daily  Benazepril 5 mg daily  -Medications previously tried: NA  -Current home readings: 114/71, 131/72, 108/77, 111/69, 110/68  -Denies hypotensive/hypertensive symptoms -Counseled to monitor BP at home weekly, document, and provide log at future appointments -Recommended to continue current medication  Hyperlipidemia: (LDL goal < 100) -Controlled -Current treatment: Atorvastatin 40 mg daily  -Medications previously tried: NA  -Recommended to continue current medication  Diabetes (A1c goal <8%) -Controlled -Current medications: Trulicity 1.5 mg weekly  -Medications previously tried: NA  -Reports she continues to get Trulicity through [employer] and is tolerating well. She has lost a significant amount of weight and is currently down to 162 pounds.  -Current home glucose readings fasting glucose: 127, 135, 132, 143, 147  post prandial glucose: NA -Denies hypoglycemic/hyperglycemic symptoms -Counseled to check feet daily and get yearly eye exams -Recommended to continue current medication  Asthma (Goal: control symptoms and prevent exacerbations) -Controlled -Current treatment  Ventolin HFA  Albuterol Nebulizer twice daily  Budesonide nebulizer twice daily  -Medications previously tried: NA  -Exacerbations requiring treatment in last 6 months: NA -Patient reports consistent use of maintenance inhaler -Frequency of rescue inhaler use: Sporadic, varies based on activity -Counseled on Benefits of consistent maintenance inhaler use -Recommended to continue current medication  Chronic Pain (Goal: Maintain adequate pain control) -Not ideally controlled -Current treatment  Diclofenac 1 % gel three times daily  Gabapentin 500 mg 1 tablet AM, 1 PM, 2 HS  Acetaminophen 500 mg every 6 hours as needed  -Medications previously tried: NA -Patient with worsening hip pain recently. Tries to limit gabapentin when possible.   -Counseled to limit APAP to 3000 mg daily   -Recommended to continue current medication  Chronic Kidney Disease Stage 3a  -All medications assessed for renal dosing and appropriateness in chronic kidney disease. -Recommended to continue current medication   Patient Goals/Self-Care Activities Patient will:  - check glucose daily, document, and provide at future appointments  Follow Up Plan: Telephone follow up appointment with care management team member scheduled for:  09/04/2021 at 10:00 AM      Medication Assistance:  Trulicity obtained through [employer] medication assistance program.  Enrollment ends Dec 2022  Compliance/Adherence/Medication fill history: Care Gaps: Covid Booster  Star-Rating Drugs: 03/18/2021 Atorvastatin 40 mg 90-Day supply with [facility] 03/10/2021 Benazepril 5 mg 90-Day supply with [facility]  Patient's preferred pharmacy is:  [facility] [address] ([address]),  - [place])  [address] Phone: [phone] Fax: [place] [phone], [place] [address] Phone: [phone] Fax: [phone]  Uses pill box? Yes Pt endorses 100% compliance  We discussed: Current pharmacy is preferred with insurance plan and patient is satisfied with pharmacy services Patient decided to: Continue current medication management strategy  Care Plan and Follow Up Patient Decision:  Patient agrees to Care Plan and Follow-up.  Plan: Telephone follow up appointment with care management team member scheduled for:  09/04/2021 at 10:00 AM  Doristine Section, [facility] [phone]

## 2021-04-04 NOTE — Patient Instructions (Signed)
Visit Information It was great speaking with you today!  Please let me know if you have any questions about our visit.   Goals Addressed             This Visit's Progress    Monitor and Manage My Blood Sugar-Diabetes Type 2       Timeframe:  Long-Range Goal Priority:  High Start Date: 04/04/2021                            Expected End Date: 10/04/2021                      Follow Up Date 06/17/2021    - check blood sugar at prescribed times - check blood sugar if I feel it is too high or too low - enter blood sugar readings and medication or insulin into daily log    Why is this important?   Checking your blood sugar at home helps to keep it from getting very high or very low.  Writing the results in a diary or log helps the doctor know how to care for you.  Your blood sugar log should have the time, date and the results.  Also, write down the amount of insulin or other medicine that you take.  Other information, like what you ate, exercise done and how you were feeling, will also be helpful.     Notes:          Patient Care Plan: General Pharmacy (Adult)     Problem Identified: Hypertension, Hyperlipidemia, Diabetes, Asthma, Chronic Kidney Disease, Osteoarthritis, Gout, and Chronic Pain   Priority: High     Long-Range Goal: Patient-Specific Goal   Start Date: 04/03/2021  Expected End Date: 10/04/2021  This Visit's Progress: On track  Priority: High  Note:   Current Barriers:  Unable to independently afford treatment regimen  Pharmacist Clinical Goal(s):  Patient will verbalize ability to afford treatment regimen maintain control of diabetes as evidenced by A1c less than 8%  through collaboration with PharmD and provider.   Interventions: 1:1 collaboration with Steele Sizer, MD regarding development and update of comprehensive plan of care as evidenced by provider attestation and co-signature Inter-disciplinary care team collaboration (see longitudinal plan of  care) Comprehensive medication review performed; medication list updated in electronic medical record  Hypertension (BP goal <140/90) -Controlled -Current treatment: Furosemide 40 mg daily  Benazepril 5 mg daily  -Medications previously tried: NA  -Current home readings: 114/71, 131/72, 108/77, 111/69, 110/68  -Denies hypotensive/hypertensive symptoms -Counseled to monitor BP at home weekly, document, and provide log at future appointments -Recommended to continue current medication  Hyperlipidemia: (LDL goal < 100) -Controlled -Current treatment: Atorvastatin 40 mg daily  -Medications previously tried: NA  -Recommended to continue current medication  Diabetes (A1c goal <8%) -Controlled -Current medications: Trulicity 1.5 mg weekly  -Medications previously tried: NA  -Reports she continues to get Trulicity through [employer] and is tolerating well. She has lost a significant amount of weight and is currently down to 162 pounds.  -Current home glucose readings fasting glucose: 127, 135, 132, 143, 147  post prandial glucose: NA -Denies hypoglycemic/hyperglycemic symptoms -Counseled to check feet daily and get yearly eye exams -Recommended to continue current medication  Asthma (Goal: control symptoms and prevent exacerbations) -Controlled -Current treatment  Ventolin HFA  Albuterol Nebulizer twice daily  Budesonide nebulizer twice daily  -Medications previously tried: NA  -Exacerbations requiring treatment  in last 6 months: NA -Patient reports consistent use of maintenance inhaler -Frequency of rescue inhaler use: Sporadic, varies based on activity -Counseled on Benefits of consistent maintenance inhaler use -Recommended to continue current medication  Chronic Pain (Goal: Maintain adequate pain control) -Not ideally controlled -Current treatment  Diclofenac 1 % gel three times daily  Gabapentin 500 mg 1 tablet AM, 1 PM, 2 HS  Acetaminophen 500 mg every 6 hours as  needed  -Medications previously tried: NA -Patient with worsening hip pain recently. Tries to limit gabapentin when possible.  -Counseled to limit APAP to 3000 mg daily   -Recommended to continue current medication  Chronic Kidney Disease Stage 3a  -All medications assessed for renal dosing and appropriateness in chronic kidney disease. -Recommended to continue current medication   Patient Goals/Self-Care Activities Patient will:  - check glucose daily, document, and provide at future appointments  Follow Up Plan: Telephone follow up appointment with care management team member scheduled for:  09/04/2021 at 10:00 AM      Patient agreed to services and verbal consent obtained.   The patient verbalized understanding of instructions, educational materials, and care plan provided today and declined offer to receive copy of patient instructions, educational materials, and care plan.   Doristine Section, [place] [facility] [phone]

## 2021-04-11 NOTE — Progress Notes (Signed)
Name: Amber Avery   MRN: [id number]    DOB: 1945/12/09   Date:04/14/2021       Progress Note  Subjective  Chief Complaint  Follow Up  HPI  Breast pain: she states pain not as severe, but still has a pulling sensation  She states usually notices when she wakes up in the morning and has been laying on right lateral decubitus, pain goes from her back to the anterior chest, described a dull sensation, intermittent, not burning or tingling. She has also noticed sharp pain under the left nipple when watching TV. She states her dog jumps usually on her left breast. Symptoms started about 5 months ago, normal mammogram and [place] , she states symptoms are not as severe.   HTN: taking benazepril, and bp has been towards low end of normal, she denies chest pain or  Palpitation,, she gets dizzy when she stoops down.  She is now on benazepril down from 20 mg to 10 mg per day and we will go down to 5 mg, she can cut down dose to 2.5 mg if bp remains below 120/70  Dyslipidemia: taking statin therapy, no side effects, no chest pain or myalgia. Last LDL was at goal and discussed with patient    Asthma Mild intermittent: she states she has been doing well, no wheezing, cough , mild SOB with activity but likely multifactorial,hse does not use pulmicort      DM with renal manifestation:  HgbA1C has been stable and down to 6 % today  back on a healthier diet. She is on Trulicity, no polyphagia, polyuria (stable because of diuretic) denies  polydipsia . She has dyslipidemia and is doing well on statin therapy , CKI and is on low dose ACE, also has neuropathy that is stable on gabapentin    Obesity: she lost more weight since last visit , she has a FitBit , she has been getting 4 thousand steps daily     OSA: she wear machine every night, she uses oxygen with CPAP and is compliant , she needs a new hose, a longer one     OA : she saw Ortho , Dr. Candelaria Stagers, and he advised her not to have surgery because of her  weight, she had one round of hyaluronic injection without help and it was too expensive . She is still taking Tylenol She still has intermittent right knee effusion when more active, no redness or increase in warmth. She has a hoveround, uses walker and sometimes cane. She states her back is the worse symptom today     Iron deficiency anemia: last level was better, under the care of Dr. Tasia Catchings and had 5 iron infusions,  normal HCT 11/2019 , she has not been back since 2019, Dr. Allen Norris - GI in 2020       Senile purpura: stable , reassurance given    Left sciatica: she is having a lot of pain over the past two months, shooting down to her left foot now, having difficult walking, would like to see Dr. Phyllis Ginger again,  Patient Active Problem List   Diagnosis Date Noted   Gait instability 12/13/2020   Stage 3a chronic kidney disease ([place]) 12/13/2020   Cellulitis of right ankle 04/12/2020   Morbid obesity with BMI of 45.0-49.9, adult ([place]) 02/06/2020   History of gastritis 08/14/2019   Angiodysplasia of stomach and duodenum    Iron deficiency anemia 04/20/2018   Anemia, unspecified 56/43/3295   Lichen sclerosus of female genitalia  06/29/2016   Primary osteoarthritis of both knees 04/07/2016   Special screening for malignant neoplasms, colon    Right thyroid nodule 06/26/2015   Asthma, intermittent 04/03/2015   Benign essential HTN 04/03/2015   Carpal tunnel syndrome 04/03/2015   Chronic kidney disease (CKD), stage II (mild) 04/03/2015   Controlled gout 04/03/2015   Arteriosclerosis of coronary artery 04/03/2015   Diabetes mellitus with renal manifestations, controlled ([place]) 04/03/2015   Dyslipidemia 04/03/2015   Edema extremities 04/03/2015   Elevated sedimentation rate 04/03/2015   Knee pain 04/03/2015   Lumbar radiculitis 04/03/2015   Obstructive apnea 04/03/2015   Lumbosacral spondylosis 04/03/2015   Osteoarthritis, chronic 04/03/2015   Psoriasis 04/03/2015   Vitamin D deficiency  04/03/2015   Varicose veins 04/03/2015   Goiter 12/23/2014   Degeneration of intervertebral disc of lumbar region 08/23/2014   Corns and callosity 06/11/2009    Past Surgical History:  Procedure Laterality Date   APPENDECTOMY     CATARACT EXTRACTION W/PHACO Left 08/14/2020   Procedure: CATARACT EXTRACTION PHACO AND INTRAOCULAR LENS PLACEMENT ([facility]) LEFT DIABETIC 3.56 00:59.6 6.0%;  Surgeon: Leandrew Koyanagi, MD;  Location: [facility];  Service: Ophthalmology;  Laterality: Left;  Diabetic   CATARACT EXTRACTION W/PHACO Right 09/04/2020   Procedure: CATARACT EXTRACTION PHACO AND INTRAOCULAR LENS PLACEMENT ([place]) RIGHT DIABETIC;  Surgeon: Leandrew Koyanagi, MD;  Location: [facility];  Service: Ophthalmology;  Laterality: Right;  4.72 1:03.2 7.4%   CHOLECYSTECTOMY  1977   COLONOSCOPY WITH PROPOFOL N/A 01/21/2016   Procedure: COLONOSCOPY WITH PROPOFOL;  Surgeon: Lucilla Lame, MD;  Location: [facility];  Service: Endoscopy;  Laterality: N/A;   DILATION AND CURETTAGE OF UTERUS     Due to Amenorrhea   ESOPHAGOGASTRODUODENOSCOPY (EGD) WITH PROPOFOL N/A 08/16/2018   Procedure: ESOPHAGOGASTRODUODENOSCOPY (EGD) WITH PROPOFOL;  Surgeon: Lucilla Lame, MD;  Location: [facility];  Service: Endoscopy;  Laterality: N/A;   [place]     HERNIA REPAIR  2011   Temporal Area Excision Biopsy     For Birth Mark Changes, Negative Pathology   TONSILLECTOMY AND ADENOIDECTOMY      Family History  Problem Relation Age of Onset   Cancer Mother        brain tumor   Kidney disease Mother    Hypertension Mother    Heart disease Father    COPD Father    Hypertension Sister    Arthritis Sister    Cancer Sister    Heart murmur Sister    GER disease Sister    Lupus Sister    Diabetes Sister    Arthritis Sister    Osteopenia Sister    Heart disease Sister    Hypertension Sister    Breast cancer Maternal Aunt 45   Leukemia Maternal Aunt      Social History   Tobacco Use   Smoking status: Former    Packs/day: 2.00    Years: 15.00    Pack years: 30.00    Types: Cigarettes    Quit date: 1983    Years since quitting: 39.5   Smokeless tobacco: Never   Tobacco comments:    smoking cessation materials not required  Substance Use Topics   Alcohol use: Not Currently    Alcohol/week: 0.0 standard drinks     Current Outpatient Medications:    acetaminophen (TYLENOL) 500 MG tablet, Take 1 tablet (500 mg total) by mouth every 6 (six) hours as needed. (Patient taking differently: Take 1,000 mg by mouth every  8 (eight) hours.), Disp: 480 tablet, Rfl: 0   albuterol (ACCUNEB) 1.25 MG/3ML nebulizer solution, Take 3 mLs (1.25 mg total) by nebulization 2 (two) times daily., Disp: 75 mL, Rfl: 2   albuterol (VENTOLIN HFA) 108 (90 Base) MCG/ACT inhaler, Inhale 2 puffs into the lungs every 6 (six) hours as needed for wheezing or shortness of breath., Disp: 8 g, Rfl: 0   allopurinol (ZYLOPRIM) 300 MG tablet, Take 1 tablet (300 mg total) by mouth daily., Disp: 90 tablet, Rfl: 3   atorvastatin (LIPITOR) 40 MG tablet, Daily for cholesterol, Disp: 90 tablet, Rfl: 3   benazepril (LOTENSIN) 5 MG tablet, Take 1 tablet (5 mg total) by mouth daily., Disp: 90 tablet, Rfl: 0   Blood Glucose Monitoring Suppl (TRUE METRIX AIR GLUCOSE METER) w/Device KIT, 1 each by Does not apply route 1 day or 1 dose. Checks Fasting Blood Sugar Twice daily Diabetes mellitus with renal manifestations, controlled ([place])      Code: E11.29, Disp: 1 kit, Rfl: 0   budesonide (PULMICORT) 0.25 MG/2ML nebulizer solution, Take 2 mLs (0.25 mg total) by nebulization 2 (two) times daily., Disp: 60 mL, Rfl: 2   diclofenac Sodium (VOLTAREN) 1 % GEL, APPLY 4 GRAMS TOPICALLY 4 TIMES DAILY AS DIRECTED, Disp: 100 g, Rfl: 0   Dulaglutide (TRULICITY) 1.5 AL/9.3XT SOPN, Inject 1.5 mg into the skin once a week., Disp: 12 pen, Rfl: 3   furosemide (LASIX) 40 MG tablet, Take 1 tablet by mouth once  daily, Disp: 90 tablet, Rfl: 1   gabapentin (NEURONTIN) 300 MG capsule, TAKE 1 CAPSULE BY MOUTH IN THE MORNING AND 1 IN THE EVENING AND 2 AT NIGHT, Disp: 360 capsule, Rfl: 3   KLOR-CON M20 20 MEQ tablet, Take 1 tablet by mouth once daily, Disp: 90 tablet, Rfl: 0   Lancets (ONETOUCH ULTRASOFT) lancets, Use as instructed, Disp: 100 each, Rfl: 12   ONETOUCH VERIO test strip, USE AS DIRECTED, Disp: 50 each, Rfl: 1   triamcinolone (KENALOG) 0.1 %, Apply topically., Disp: , Rfl:    triamcinolone ointment (KENALOG) 0.1 %, APPLY TOPICALLY TWICE DAILY AS NEEDED, Disp: 90 g, Rfl: 0   Vitamin D, Cholecalciferol, 25 MCG (1000 UT) TABS, Take 1 capsule by mouth daily., Disp: 90 tablet, Rfl: 1   Triamcinolone Acetonide (TRIAMCINOLONE 0.1 % CREAM : EUCERIN) CREA, Apply 1 application topically. (Patient not taking: Reported on 04/14/2021), Disp: , Rfl:   Allergies  Allergen Reactions   Codeine Hives and Nausea And Vomiting    I personally reviewed active problem list, medication list, allergies, family history, social history, health maintenance with the patient/caregiver today.   ROS  Constitutional: Negative for fever , positive for mild  weight change.  Respiratory: Negative for cough and shortness of breath.   Cardiovascular: Negative for chest pain or palpitations.  Gastrointestinal: Negative for abdominal pain, no bowel changes.  Musculoskeletal: positive for gait problem but no joint swelling.  Skin: Negative for rash.  Neurological: Negative for dizziness or headache.  No other specific complaints in a complete review of systems (except as listed in HPI above).   Objective  Vitals:   04/14/21 1106  BP: 122/70  Pulse: 84  Resp: 16  Temp: 98.1 F (36.7 C)  TempSrc: Oral  SpO2: 94%  Weight: 260 lb (117.9 kg)  Height: 5' 2"  (1.575 m)    Body mass index is 47.55 kg/m.  Physical Exam  Constitutional: Patient appears well-developed and well-nourished. Obese  No distress.  HEENT: head  atraumatic, normocephalic,  pupils equal and reactive to light, neck supple Cardiovascular: Normal rate, regular rhythm and normal heart sounds.  No murmur heard. Trace  BLE edema. Pulmonary/Chest: Effort normal and breath sounds normal. No respiratory distress. Abdominal: Soft.  There is no tenderness. Psychiatric: Patient has a normal mood and affect. behavior is normal. Judgment and thought content normal.  Muscular skeletal: sitting on wheelchair, mild effusion right knee, pain during palpation of lumbar spine, negative straight leg raise   Recent Results (from the past 2160 hour(s))  POCT HgB A1C     Status: Abnormal   Collection Time: 04/14/21 11:12 AM  Result Value Ref Range   Hemoglobin A1C 6.0 (A) 4.0 - 5.6 %   HbA1c POC (<> result, manual entry)     HbA1c, POC (prediabetic range)     HbA1c, POC (controlled diabetic range)       PHQ2/9: Depression screen [facility] 2/9 04/14/2021 12/13/2020 09/10/2020 08/12/2020 06/20/2020  Decreased Interest 0 0 0 0 0  Down, Depressed, Hopeless 0 0 0 0 0  PHQ - 2 Score 0 0 0 0 0  Altered sleeping - - 0 - -  Tired, decreased energy - - 1 - -  Change in appetite - - 0 - -  Feeling bad or failure about yourself  - - 0 - -  Trouble concentrating - - 0 - -  Moving slowly or fidgety/restless - - 0 - -  Suicidal thoughts - - 0 - -  PHQ-9 Score - - 1 - -  Difficult doing work/chores - - Not difficult at all - -  Some recent data might be hidden    phq 9 is negative   Fall Risk: Fall Risk  04/14/2021 12/13/2020 08/12/2020 06/20/2020 04/12/2020  Falls in the past year? 0 0 0 0 0  Number falls in past yr: 0 0 0 0 0  Injury with Fall? 0 0 0 0 0  Risk for fall due to : - - - Impaired mobility;Impaired balance/gait -  Risk for fall due to: Comment - - - - -  Follow up - - - Falls prevention discussed -     Functional Status Survey: Is the patient deaf or have difficulty hearing?: No Does the patient have difficulty seeing, even when wearing  glasses/contacts?: Yes Does the patient have difficulty concentrating, remembering, or making decisions?: No Does the patient have difficulty walking or climbing stairs?: Yes Does the patient have difficulty dressing or bathing?: No Does the patient have difficulty doing errands alone such as visiting a doctor's office or shopping?: Yes    Assessment & Plan  1. Controlled type 2 diabetes mellitus with stage 3 chronic kidney disease, without long-term current use of insulin (HCC)  - POCT HgB A1C  2. Senile purpura (HCC)  Stable  3. Benign essential HTN  - benazepril (LOTENSIN) 5 MG tablet; Take 1 tablet (5 mg total) by mouth daily.  Dispense: 90 tablet; Refill: 3 - potassium chloride SA (KLOR-CON M20) 20 MEQ tablet; Take 1 tablet (20 mEq total) by mouth daily.  Dispense: 90 tablet; Refill: 3  4. Hypertension associated with type 2 diabetes mellitus (HCC)   5. Stage 3a chronic kidney disease (HCC)  - benazepril (LOTENSIN) 5 MG tablet; Take 1 tablet (5 mg total) by mouth daily.  Dispense: 90 tablet; Refill: 3  6. B12 deficiency   7. Hyperlipidemia associated with type 2 diabetes mellitus ([place])   8. Osteoarthritis, chronic   9. Asthma, mild intermittent, well-controlled   10.  Gait instability   11. Vitamin D deficiency   12. Morbid obesity with BMI of 45.0-49.9, adult [facility])  Discussed with the patient the risk posed by an increased BMI. Discussed importance of portion control, calorie counting and at least 150 minutes of physical activity weekly. Avoid sweet beverages and drink more water. Eat at least 6 servings of fruit and vegetables daily    13. Controlled gout  - allopurinol (ZYLOPRIM) 300 MG tablet; Take 1 tablet (300 mg total) by mouth daily.  Dispense: 90 tablet; Refill: 3  14. Obstructive apnea   15. Left sided sciatica  Referral Dr. Phyllis Ginger Medrol dose pack   16. Dyslipidemia  - atorvastatin (LIPITOR) 40 MG tablet; Daily for cholesterol   Dispense: 90 tablet; Refill: 3  17. Bilateral edema of lower extremity  - furosemide (LASIX) 40 MG tablet; Take 1 tablet (40 mg total) by mouth daily.  Dispense: 90 tablet; Refill: 3  18. Lumbar back pain with radiculopathy affecting left lower extremity  - gabapentin (NEURONTIN) 300 MG capsule; TAKE 1 CAPSULE BY MOUTH IN THE MORNING AND 1 IN THE EVENING AND 2 AT NIGHT  Dispense: 360 capsule; Refill: 3

## 2021-04-14 ENCOUNTER — Other Ambulatory Visit: Payer: Self-pay

## 2021-04-14 ENCOUNTER — Encounter: Payer: Self-pay | Admitting: Family Medicine

## 2021-04-14 ENCOUNTER — Ambulatory Visit (INDEPENDENT_AMBULATORY_CARE_PROVIDER_SITE_OTHER): Payer: Medicare Other | Admitting: Family Medicine

## 2021-04-14 VITALS — BP 122/70 | HR 84 | Temp 98.1°F | Resp 16 | Ht 62.0 in | Wt 260.0 lb

## 2021-04-14 DIAGNOSIS — E1122 Type 2 diabetes mellitus with diabetic chronic kidney disease: Secondary | ICD-10-CM

## 2021-04-14 DIAGNOSIS — E1159 Type 2 diabetes mellitus with other circulatory complications: Secondary | ICD-10-CM | POA: Diagnosis not present

## 2021-04-14 DIAGNOSIS — N1831 Chronic kidney disease, stage 3a: Secondary | ICD-10-CM | POA: Diagnosis not present

## 2021-04-14 DIAGNOSIS — E538 Deficiency of other specified B group vitamins: Secondary | ICD-10-CM

## 2021-04-14 DIAGNOSIS — D692 Other nonthrombocytopenic purpura: Secondary | ICD-10-CM | POA: Diagnosis not present

## 2021-04-14 DIAGNOSIS — G4733 Obstructive sleep apnea (adult) (pediatric): Secondary | ICD-10-CM

## 2021-04-14 DIAGNOSIS — I152 Hypertension secondary to endocrine disorders: Secondary | ICD-10-CM

## 2021-04-14 DIAGNOSIS — N183 Chronic kidney disease, stage 3 unspecified: Secondary | ICD-10-CM | POA: Diagnosis not present

## 2021-04-14 DIAGNOSIS — E559 Vitamin D deficiency, unspecified: Secondary | ICD-10-CM | POA: Diagnosis not present

## 2021-04-14 DIAGNOSIS — J4520 Mild intermittent asthma, uncomplicated: Secondary | ICD-10-CM

## 2021-04-14 DIAGNOSIS — R600 Localized edema: Secondary | ICD-10-CM

## 2021-04-14 DIAGNOSIS — M5416 Radiculopathy, lumbar region: Secondary | ICD-10-CM

## 2021-04-14 DIAGNOSIS — E1169 Type 2 diabetes mellitus with other specified complication: Secondary | ICD-10-CM

## 2021-04-14 DIAGNOSIS — M5432 Sciatica, left side: Secondary | ICD-10-CM

## 2021-04-14 DIAGNOSIS — E785 Hyperlipidemia, unspecified: Secondary | ICD-10-CM

## 2021-04-14 DIAGNOSIS — I10 Essential (primary) hypertension: Secondary | ICD-10-CM

## 2021-04-14 DIAGNOSIS — R2681 Unsteadiness on feet: Secondary | ICD-10-CM | POA: Diagnosis not present

## 2021-04-14 DIAGNOSIS — M109 Gout, unspecified: Secondary | ICD-10-CM

## 2021-04-14 DIAGNOSIS — M1990 Unspecified osteoarthritis, unspecified site: Secondary | ICD-10-CM | POA: Diagnosis not present

## 2021-04-14 DIAGNOSIS — Z6841 Body Mass Index (BMI) 40.0 and over, adult: Secondary | ICD-10-CM

## 2021-04-14 LAB — POCT GLYCOSYLATED HEMOGLOBIN (HGB A1C): Hemoglobin A1C: 6 % — AB (ref 4.0–5.6)

## 2021-04-14 MED ORDER — BENAZEPRIL HCL 5 MG PO TABS: 5.0000 mg | ORAL_TABLET | Freq: Every day | ORAL | 3 refills | Status: DC

## 2021-04-14 MED ORDER — GABAPENTIN 300 MG PO CAPS: ORAL_CAPSULE | ORAL | 3 refills | Status: DC

## 2021-04-14 MED ORDER — POTASSIUM CHLORIDE CRYS ER 20 MEQ PO TBCR: 20.0000 meq | EXTENDED_RELEASE_TABLET | Freq: Every day | ORAL | 3 refills | Status: DC

## 2021-04-14 MED ORDER — ATORVASTATIN CALCIUM 40 MG PO TABS: ORAL_TABLET | ORAL | 3 refills | Status: DC

## 2021-04-14 MED ORDER — METHYLPREDNISOLONE 4 MG PO TBPK: ORAL_TABLET | ORAL | 0 refills | Status: DC

## 2021-04-14 MED ORDER — ALLOPURINOL 300 MG PO TABS: 300.0000 mg | ORAL_TABLET | Freq: Every day | ORAL | 3 refills | Status: DC

## 2021-04-14 MED ORDER — FUROSEMIDE 40 MG PO TABS: 40.0000 mg | ORAL_TABLET | Freq: Every day | ORAL | 3 refills | Status: DC

## 2021-04-16 DIAGNOSIS — J449 Chronic obstructive pulmonary disease, unspecified: Secondary | ICD-10-CM | POA: Diagnosis not present

## 2021-04-17 ENCOUNTER — Telehealth: Payer: Self-pay

## 2021-04-17 NOTE — Progress Notes (Signed)
    Chronic Care Management Pharmacy Assistant   Name: Amber Avery  MRN: [id number] DOB: Feb 14, 1946  Reason for Encounter: Medication Review/Adherence Review    Recent office visits:  04/14/2021 Dr.Sowles MD (PCP) Decrease Benazepri from 5 mg 2.$[place]'5mg'UiGmq$ .   Recent consult visits:  No recent [facility] visits:  None in previous 6 months  Medications: Outpatient Encounter Medications as of 04/17/2021  Medication Sig Note   acetaminophen (TYLENOL) 500 MG tablet Take 1 tablet (500 mg total) by mouth every 6 (six) hours as needed. (Patient taking differently: Take 1,000 mg by mouth every 8 (eight) hours.)    albuterol (ACCUNEB) 1.25 MG/3ML nebulizer solution Take 3 mLs (1.25 mg total) by nebulization 2 (two) times daily. 10/20/2018: Patient has not needed to use nebulizer   albuterol (VENTOLIN HFA) 108 (90 Base) MCG/ACT inhaler Inhale 2 puffs into the lungs every 6 (six) hours as needed for wheezing or shortness of breath.    allopurinol (ZYLOPRIM) 300 MG tablet Take 1 tablet (300 mg total) by mouth daily.    atorvastatin (LIPITOR) 40 MG tablet Daily for cholesterol    benazepril (LOTENSIN) 5 MG tablet Take 1 tablet (5 mg total) by mouth daily.    Blood Glucose Monitoring Suppl (TRUE METRIX AIR GLUCOSE METER) w/Device KIT 1 each by Does not apply route 1 day or 1 dose. Checks Fasting Blood Sugar Twice daily Diabetes mellitus with renal manifestations, controlled ([place])      Code: E11.29 06/20/2020: Pt using one touch verio meter   diclofenac Sodium (VOLTAREN) 1 % GEL APPLY 4 GRAMS TOPICALLY 4 TIMES DAILY AS DIRECTED    Dulaglutide (TRULICITY) 1.5 QD/6.4RC SOPN Inject 1.5 mg into the skin once a week.    furosemide (LASIX) 40 MG tablet Take 1 tablet (40 mg total) by mouth daily.    gabapentin (NEURONTIN) 300 MG capsule TAKE 1 CAPSULE BY MOUTH IN THE MORNING AND 1 IN THE EVENING AND 2 AT NIGHT    Lancets (ONETOUCH ULTRASOFT) lancets Use as instructed    methylPREDNISolone (MEDROL  DOSEPAK) 4 MG TBPK tablet Take as directed    ONETOUCH VERIO test strip USE AS DIRECTED    potassium chloride SA (KLOR-CON M20) 20 MEQ tablet Take 1 tablet (20 mEq total) by mouth daily.    triamcinolone (KENALOG) 0.1 % Apply topically.    triamcinolone ointment (KENALOG) 0.1 % APPLY TOPICALLY TWICE DAILY AS NEEDED    Vitamin D, Cholecalciferol, 25 MCG (1000 UT) TABS Take 1 capsule by mouth daily.    No facility-administered encounter medications on file as of 04/17/2021.    Care Gaps: Per patient chart, patient is not due for any care gap.  Star Rating Drugs: 03/18/2021 Atorvastatin 40 mg 90-Day supply with [facility] [place] [facility] 03/10/2021 Benazepril 5 mg 90-Day supply with [place] Trulicity 1.5 mg (patient receives this medication through PAP program she reports she has plenty available)  [place] Pharmacist Assistant [phone]

## 2021-04-24 ENCOUNTER — Telehealth: Payer: Self-pay | Admitting: Family Medicine

## 2021-04-24 DIAGNOSIS — I10 Essential (primary) hypertension: Secondary | ICD-10-CM

## 2021-04-24 DIAGNOSIS — E785 Hyperlipidemia, unspecified: Secondary | ICD-10-CM

## 2021-04-24 DIAGNOSIS — R600 Localized edema: Secondary | ICD-10-CM

## 2021-04-24 DIAGNOSIS — M109 Gout, unspecified: Secondary | ICD-10-CM

## 2021-04-24 DIAGNOSIS — N1831 Chronic kidney disease, stage 3a: Secondary | ICD-10-CM

## 2021-04-24 DIAGNOSIS — M5416 Radiculopathy, lumbar region: Secondary | ICD-10-CM

## 2021-04-24 NOTE — Telephone Encounter (Signed)
Pt checking status on her medications that [facility]. As of today she has not received or heard anything from them. Please check into this for her and give her a call if they do not have any prescriptions on file.

## 2021-04-24 NOTE — Telephone Encounter (Signed)
Pt called [facility] and they did not receive her prescriptions. Please fax to [phone] or electronically to [facility]  [place] [phone]

## 2021-04-24 NOTE — Telephone Encounter (Signed)
[place] calling [employer] is calling because the pt states that Dr. Ancil Boozer sent in 21 Pas for the pt. [employer] looked in Cover My Meds and does not see any Pa. Please advise [phone]

## 2021-04-25 NOTE — Telephone Encounter (Signed)
Left vm sowles sent on 6/27

## 2021-05-01 NOTE — Telephone Encounter (Signed)
Pt is calling to ask if her prescriptions can be sent to [facility] [phone]- directly to pharmacy.

## 2021-05-02 ENCOUNTER — Other Ambulatory Visit: Payer: Self-pay | Admitting: Family Medicine

## 2021-05-02 DIAGNOSIS — I10 Essential (primary) hypertension: Secondary | ICD-10-CM

## 2021-05-02 DIAGNOSIS — N1831 Chronic kidney disease, stage 3a: Secondary | ICD-10-CM

## 2021-05-02 DIAGNOSIS — R600 Localized edema: Secondary | ICD-10-CM

## 2021-05-02 DIAGNOSIS — M109 Gout, unspecified: Secondary | ICD-10-CM

## 2021-05-02 DIAGNOSIS — M5416 Radiculopathy, lumbar region: Secondary | ICD-10-CM

## 2021-05-02 DIAGNOSIS — E785 Hyperlipidemia, unspecified: Secondary | ICD-10-CM

## 2021-05-02 MED ORDER — FUROSEMIDE 40 MG PO TABS: 40.0000 mg | ORAL_TABLET | Freq: Every day | ORAL | 3 refills | Status: DC

## 2021-05-02 MED ORDER — ATORVASTATIN CALCIUM 40 MG PO TABS: ORAL_TABLET | ORAL | 3 refills | Status: DC

## 2021-05-02 MED ORDER — BENAZEPRIL HCL 5 MG PO TABS: 5.0000 mg | ORAL_TABLET | Freq: Every day | ORAL | 3 refills | Status: DC

## 2021-05-02 MED ORDER — ALLOPURINOL 300 MG PO TABS: 300.0000 mg | ORAL_TABLET | Freq: Every day | ORAL | 3 refills | Status: DC

## 2021-05-02 MED ORDER — GABAPENTIN 300 MG PO CAPS: ORAL_CAPSULE | ORAL | 3 refills | Status: DC

## 2021-05-02 MED ORDER — POTASSIUM CHLORIDE CRYS ER 20 MEQ PO TBCR: 20.0000 meq | EXTENDED_RELEASE_TABLET | Freq: Every day | ORAL | 3 refills | Status: DC

## 2021-05-02 NOTE — Telephone Encounter (Signed)
She states they did not receive any, please resend?

## 2021-05-02 NOTE — Addendum Note (Signed)
Addended by: Docia Furl on: 05/02/2021 04:33 PM   Modules accepted: Orders

## 2021-05-05 DIAGNOSIS — M5416 Radiculopathy, lumbar region: Secondary | ICD-10-CM | POA: Diagnosis not present

## 2021-05-05 DIAGNOSIS — M545 Low back pain, unspecified: Secondary | ICD-10-CM | POA: Diagnosis not present

## 2021-05-05 DIAGNOSIS — M62830 Muscle spasm of back: Secondary | ICD-10-CM | POA: Diagnosis not present

## 2021-05-05 DIAGNOSIS — M5136 Other intervertebral disc degeneration, lumbar region: Secondary | ICD-10-CM | POA: Diagnosis not present

## 2021-05-09 ENCOUNTER — Other Ambulatory Visit: Payer: Self-pay | Admitting: Family Medicine

## 2021-05-09 DIAGNOSIS — M1990 Unspecified osteoarthritis, unspecified site: Secondary | ICD-10-CM

## 2021-05-09 MED ORDER — DICLOFENAC SODIUM 1 % EX GEL: CUTANEOUS | 0 refills | Status: DC

## 2021-05-09 NOTE — Telephone Encounter (Signed)
Copied from [place] [id number]. Topic: Quick Communication - Rx Refill/Question >> May 09, 2021  1:26 PM Leward Quan A wrote: Medication: diclofenac Sodium (VOLTAREN) 1 % GEL  Has the patient contacted their pharmacy? Yes.   (Agent: If no, request that the patient contact the pharmacy for the refill.) (Agent: If yes, when and what did the pharmacy advise?)  Preferred Pharmacy (with phone number or street name): [facility] [place], [place]  Phone:  [phone] Fax:  [phone]     Agent: Please be advised that RX refills may take up to 3 business days. We ask that you follow-up with your pharmacy.

## 2021-05-16 DIAGNOSIS — M48062 Spinal stenosis, lumbar region with neurogenic claudication: Secondary | ICD-10-CM | POA: Diagnosis not present

## 2021-05-16 DIAGNOSIS — J449 Chronic obstructive pulmonary disease, unspecified: Secondary | ICD-10-CM | POA: Diagnosis not present

## 2021-05-16 DIAGNOSIS — M5416 Radiculopathy, lumbar region: Secondary | ICD-10-CM | POA: Diagnosis not present

## 2021-05-23 ENCOUNTER — Telehealth: Payer: Self-pay

## 2021-05-23 NOTE — Progress Notes (Signed)
Chronic Care Management Pharmacy Assistant   Name: Amber Avery  MRN: [id number] DOB: 06/26/1946  Reason for Encounter: Diabetes Disease State Call   Recent office visits:  04/14/2021 Steele Sizer, MD (PCP Office Visit) for Follow-up- Per note Benazepril will be reduced to 5 mg daily and if bp remains 120/70 patient instructed she can cut the dose to 2.5 mg. Patient started on Methylprednisolone 4 mg; Discontinued Budesonide 0.25 mg; Referral for Pain Clinic placed.  Recent consult visits:  05/05/2021 [facility] and Loree Fee Meeler provider (Physical Medicine and Rehab) according to the note I do not have the appropriate security to download this document.  Hospital visits:  None in previous 6 months  Medications: Outpatient Encounter Medications as of 05/23/2021  Medication Sig Note   acetaminophen (TYLENOL) 500 MG tablet Take 1 tablet (500 mg total) by mouth every 6 (six) hours as needed. (Patient taking differently: Take 1,000 mg by mouth every 8 (eight) hours.)    albuterol (ACCUNEB) 1.25 MG/3ML nebulizer solution Take 3 mLs (1.25 mg total) by nebulization 2 (two) times daily. 10/20/2018: Patient has not needed to use nebulizer   albuterol (VENTOLIN HFA) 108 (90 Base) MCG/ACT inhaler Inhale 2 puffs into the lungs every 6 (six) hours as needed for wheezing or shortness of breath.    allopurinol (ZYLOPRIM) 300 MG tablet Take 1 tablet (300 mg total) by mouth daily.    atorvastatin (LIPITOR) 40 MG tablet Daily for cholesterol    benazepril (LOTENSIN) 5 MG tablet Take 1 tablet (5 mg total) by mouth daily.    Blood Glucose Monitoring Suppl (TRUE METRIX AIR GLUCOSE METER) w/Device KIT 1 each by Does not apply route 1 day or 1 dose. Checks Fasting Blood Sugar Twice daily Diabetes mellitus with renal manifestations, controlled ([place])      Code: E11.29 06/20/2020: Pt using one touch verio meter   diclofenac Sodium (VOLTAREN) 1 % GEL APPLY 4 GRAMS TOPICALLY 4 TIMES DAILY AS  DIRECTED    Dulaglutide (TRULICITY) 1.5 XI/3.3AS SOPN Inject 1.5 mg into the skin once a week.    furosemide (LASIX) 40 MG tablet Take 1 tablet (40 mg total) by mouth daily.    gabapentin (NEURONTIN) 300 MG capsule TAKE 1 CAPSULE BY MOUTH IN THE MORNING AND 1 IN THE EVENING AND 2 AT NIGHT    Lancets (ONETOUCH ULTRASOFT) lancets Use as instructed    ONETOUCH VERIO test strip USE AS DIRECTED    potassium chloride SA (KLOR-CON M20) 20 MEQ tablet Take 1 tablet (20 mEq total) by mouth daily.    triamcinolone (KENALOG) 0.1 % Apply topically.    triamcinolone ointment (KENALOG) 0.1 % APPLY TOPICALLY TWICE DAILY AS NEEDED    Vitamin D, Cholecalciferol, 25 MCG (1000 UT) TABS Take 1 capsule by mouth daily.    No facility-administered encounter medications on file as of 05/23/2021.   Care Gaps: INFLUENZA VACCINE  Star Rating Drugs: Atorvastatin 40 mg last filled on  05/06/2021 for a 90-Day supply with [facility] Benazepril 5 mg last filled on  05/06/2021 for a 90-Day supply with [facility]   Recent Office Vitals: BP Readings from Last 3 Encounters:  04/14/21 122/70  12/13/20 116/72  09/10/20 108/70   Pulse Readings from Last 3 Encounters:  04/14/21 84  12/13/20 93  09/10/20 83    Wt Readings from Last 3 Encounters:  04/14/21 260 lb (117.9 kg)  12/13/20 265 lb 9.6 oz (120.5 kg)  09/10/20 268 lb 3.2 oz (121.7  kg)     Kidney Function Lab Results  Component Value Date/Time   CREATININE 1.18 (H) 12/13/2020 11:44 AM   CREATININE 1.16 (H) 12/14/2019 10:49 AM   GFRNONAA 46 (L) 12/13/2020 11:44 AM   GFRAA 53 (L) 12/13/2020 11:44 AM    BMP Latest Ref Rng & Units 12/13/2020 12/14/2019 12/08/2018  Glucose 65 - 99 mg/dL 110(H) 119(H) 96  BUN 7 - 25 mg/dL 27(H) 43(H) 40(H)  Creatinine 0.60 - 0.93 mg/dL 1.18(H) 1.16(H) 1.06(H)  BUN/Creat Ratio 6 - 22 (calc) 23(H) 37(H) 38(H)  Sodium 135 - 146 mmol/L 142 140 140  Potassium 3.5 - 5.3 mmol/L 5.2 4.8 4.7  Chloride 98 - 110  mmol/L 105 106 105  CO2 20 - 32 mmol/L _0 Calcium 8.6 - 10.4 mg/dL 10.3 9.3 9.7   Recent Relevant Labs: Lab Results  Component Value Date/Time   HGBA1C 6.0 (A) 04/14/2021 11:12 AM   HGBA1C 6.8 (A) 12/13/2020 10:44 AM   HGBA1C 6.0 04/14/2019 10:33 AM   HGBA1C 6.3 12/08/2018 11:22 AM   MICROALBUR <0.2 12/13/2020 11:44 AM   MICROALBUR 0.3 12/14/2019 10:49 AM   MICROALBUR 20 04/11/2018 10:53 AM   MICROALBUR 20 08/09/2017 09:57 AM    Kidney Function Lab Results  Component Value Date/Time   CREATININE 1.18 (H) 12/13/2020 11:44 AM   CREATININE 1.16 (H) 12/14/2019 10:49 AM   GFRNONAA 46 (L) 12/13/2020 11:44 AM   GFRAA 53 (L) 12/13/2020 11:44 AM   Reviewed chart prior to disease state call. Spoke with patient regarding BP  Current antihyperglycemic regimen:  Trulicity 1.5 mg inject weekly  What recent interventions/DTPs have been made to improve glycemic control:  None ID  Have there been any recent hospitalizations or ED visits since last visit with CPP? No  Patient denies hypoglycemic symptoms, including Pale, Sweaty, Shaky, Hungry, Nervous/irritable, and Vision changes  Patient denies hyperglycemic symptoms, including blurry vision, excessive thirst, fatigue, polyuria, and weakness  How often are you checking your blood sugar? once daily (Patient stated that she has not been taking her Blood sugars lately as she has recently moved with her sister and just hasn't gotten back into the routine of things but she stated she will start remembering to take it.  What are your blood sugars ranging?  No numbers given today due to the patient hasn't taken it in a while.  During the week, how often does your blood glucose drop below 70? Never  Are you checking your feet daily/regularly? Patient denies any issues with her feet at this time.  Adherence Review: Is the patient currently on a STATIN medication? Yes Is the patient currently on ACE/ARB medication? Yes Does the patient  have >5 day gap between last estimated fill dates? No  Patient reports that she has recently moved in with her sister, and she is doing much better. She stated that she is eating healthier. She eats salads most of the time, has been limiting her sugar and carb intake and she has not been eating any salt. Patient stated the only issue she is currently experiencing is her hip and knee pain. Patient reported that she did receive injections a few weeks ago but she still is in a significant amount of pain. Patient stated that the injections helped very little. She does have a hard time walking and moving around and she stated she has a follow-up appointment regarding this in a few weeks. Patient stated she currently is taking all her medications daily as prescribed,  and currently does not need any refills on anything. Patient provided with my phone number of [phone] for future communication and was advised she can also call me any time.   Patient has an upcoming appointment with Junius Argyle, CPP on 09/04/2021 @ 1000  AWV scheduled for 06/24/2021 @ [place], CPA/CMA Catering manager Phone: [phone]

## 2021-06-06 DIAGNOSIS — M5136 Other intervertebral disc degeneration, lumbar region: Secondary | ICD-10-CM | POA: Diagnosis not present

## 2021-06-06 DIAGNOSIS — M5416 Radiculopathy, lumbar region: Secondary | ICD-10-CM | POA: Diagnosis not present

## 2021-06-06 DIAGNOSIS — M48062 Spinal stenosis, lumbar region with neurogenic claudication: Secondary | ICD-10-CM | POA: Diagnosis not present

## 2021-06-11 DIAGNOSIS — M5416 Radiculopathy, lumbar region: Secondary | ICD-10-CM | POA: Diagnosis not present

## 2021-06-11 DIAGNOSIS — M48062 Spinal stenosis, lumbar region with neurogenic claudication: Secondary | ICD-10-CM | POA: Diagnosis not present

## 2021-06-16 DIAGNOSIS — J449 Chronic obstructive pulmonary disease, unspecified: Secondary | ICD-10-CM | POA: Diagnosis not present

## 2021-06-24 ENCOUNTER — Ambulatory Visit (INDEPENDENT_AMBULATORY_CARE_PROVIDER_SITE_OTHER): Payer: Medicare Other

## 2021-06-24 ENCOUNTER — Other Ambulatory Visit: Payer: Self-pay

## 2021-06-24 VITALS — BP 118/64 | HR 93 | Temp 98.2°F | Resp 16 | Ht 62.0 in | Wt 258.5 lb

## 2021-06-24 DIAGNOSIS — Z23 Encounter for immunization: Secondary | ICD-10-CM

## 2021-06-24 DIAGNOSIS — Z0000 Encounter for general adult medical examination without abnormal findings: Secondary | ICD-10-CM | POA: Diagnosis not present

## 2021-06-24 DIAGNOSIS — Z780 Asymptomatic menopausal state: Secondary | ICD-10-CM | POA: Diagnosis not present

## 2021-06-24 DIAGNOSIS — M1990 Unspecified osteoarthritis, unspecified site: Secondary | ICD-10-CM

## 2021-06-24 MED ORDER — ONETOUCH VERIO VI STRP: ORAL_STRIP | 1 refills | Status: DC

## 2021-06-24 MED ORDER — DICLOFENAC SODIUM 1 % EX GEL: CUTANEOUS | 0 refills | Status: DC

## 2021-06-24 NOTE — Progress Notes (Signed)
Subjective:   Amber Avery is a 75 y.o. female who presents for Medicare Annual (Subsequent) preventive examination.  Review of Systems     Cardiac Risk Factors include: advanced age (>17mn, >>62women);diabetes mellitus;dyslipidemia;hypertension;obesity (BMI >30kg/m2);sedentary lifestyle     Objective:    Today's Vitals   06/24/21 1537 06/24/21 1540  BP: 118/64   Pulse: 93   Resp: 16   Temp: 98.2 F (36.8 C)   TempSrc: Oral   SpO2: 97%   Weight: 258 lb 8 oz (117.3 kg)   Height: 5' 2"  (1.575 m)   PainSc:  6    Body mass index is 47.28 kg/m.  Advanced Directives 06/24/2021 09/04/2020 08/14/2020 06/20/2019 09/01/2018 08/16/2018 06/17/2018  Does Patient Have a Medical Advance Directive? Yes Yes Yes Yes Yes Yes Yes  Type of AParamedicof [place]Living will [place]South DennisLiving will [place]Living will [place] PlainsLiving will - HPisgahLiving will [place]ound LakeLiving will  Does patient want to make changes to medical advance directive? - No - Patient declined No - Patient declined - - - -  Copy of [place] Chart? Yes - validated most recent copy scanned in chart (See row information) Yes - validated most recent copy scanned in chart (See row information) Yes - validated most recent copy scanned in chart (See row information) Yes - validated most recent copy scanned in chart (See row information) - Yes Yes  Would patient like information on creating a medical advance directive? - - - - - - Yes (MAU/Ambulatory/Procedural Areas - Information given)    Current Medications (verified) Outpatient Encounter Medications as of 06/24/2021  Medication Sig   acetaminophen (TYLENOL) 500 MG tablet Take 1 tablet (500 mg total) by mouth every 6 (six) hours as needed. (Patient taking differently: Take 1,000 mg by mouth every 8 (eight) hours.)   allopurinol  (ZYLOPRIM) 300 MG tablet Take 1 tablet (300 mg total) by mouth daily.   atorvastatin (LIPITOR) 40 MG tablet Daily for cholesterol   benazepril (LOTENSIN) 5 MG tablet Take 1 tablet (5 mg total) by mouth daily.   diclofenac Sodium (VOLTAREN) 1 % GEL APPLY 4 GRAMS TOPICALLY 4 TIMES DAILY AS DIRECTED   Dulaglutide (TRULICITY) 1.5 MHW/3.8UESOPN Inject 1.5 mg into the skin once a week.   furosemide (LASIX) 40 MG tablet Take 1 tablet (40 mg total) by mouth daily.   gabapentin (NEURONTIN) 300 MG capsule TAKE 1 CAPSULE BY MOUTH IN THE MORNING AND 1 IN THE EVENING AND 2 AT NIGHT   Lancets (ONETOUCH ULTRASOFT) lancets Use as instructed   ONETOUCH VERIO test strip USE AS DIRECTED   potassium chloride SA (KLOR-CON M20) 20 MEQ tablet Take 1 tablet (20 mEq total) by mouth daily.   triamcinolone ointment (KENALOG) 0.1 % APPLY TOPICALLY TWICE DAILY AS NEEDED   vitamin B-12 (CYANOCOBALAMIN) 500 MCG tablet Take 500 mcg by mouth daily.   Vitamin D, Cholecalciferol, 25 MCG (1000 UT) TABS Take 1 capsule by mouth daily.   albuterol (ACCUNEB) 1.25 MG/3ML nebulizer solution Take 3 mLs (1.25 mg total) by nebulization 2 (two) times daily. (Patient not taking: Reported on 06/24/2021)   albuterol (VENTOLIN HFA) 108 (90 Base) MCG/ACT inhaler Inhale 2 puffs into the lungs every 6 (six) hours as needed for wheezing or shortness of breath. (Patient not taking: Reported on 06/24/2021)   triamcinolone (KENALOG) 0.1 % Apply topically. (Patient not taking: Reported on 06/24/2021)   [  DISCONTINUED] Blood Glucose Monitoring Suppl (TRUE METRIX AIR GLUCOSE METER) w/Device KIT 1 each by Does not apply route 1 day or 1 dose. Checks Fasting Blood Sugar Twice daily Diabetes mellitus with renal manifestations, controlled ([place])      Code: E11.29   No facility-administered encounter medications on file as of 06/24/2021.    Allergies (verified) Codeine   History: Past Medical History:  Diagnosis Date   Asthma    Diabetes mellitus without  complication ([place])    Gout    Hyperlipidemia    Hypertension    Iron deficiency anemia 04/20/2018   PONV (postoperative nausea and vomiting)    after hemorroid surgery   Shortness of breath dyspnea    Sleep apnea    Past Surgical History:  Procedure Laterality Date   APPENDECTOMY     CATARACT EXTRACTION W/PHACO Left 08/14/2020   Procedure: CATARACT EXTRACTION PHACO AND INTRAOCULAR LENS PLACEMENT ([facility]) LEFT DIABETIC 3.56 00:59.6 6.0%;  Surgeon: Leandrew Koyanagi, MD;  Location: [facility];  Service: Ophthalmology;  Laterality: Left;  Diabetic   CATARACT EXTRACTION W/PHACO Right 09/04/2020   Procedure: CATARACT EXTRACTION PHACO AND INTRAOCULAR LENS PLACEMENT ([place]) RIGHT DIABETIC;  Surgeon: Leandrew Koyanagi, MD;  Location: [facility];  Service: Ophthalmology;  Laterality: Right;  4.72 1:03.2 7.4%   CHOLECYSTECTOMY  1977   COLONOSCOPY WITH PROPOFOL N/A 01/21/2016   Procedure: COLONOSCOPY WITH PROPOFOL;  Surgeon: Lucilla Lame, MD;  Location: [facility];  Service: Endoscopy;  Laterality: N/A;   DILATION AND CURETTAGE OF UTERUS     Due to Amenorrhea   ESOPHAGOGASTRODUODENOSCOPY (EGD) WITH PROPOFOL N/A 08/16/2018   Procedure: ESOPHAGOGASTRODUODENOSCOPY (EGD) WITH PROPOFOL;  Surgeon: Lucilla Lame, MD;  Location: [facility];  Service: Endoscopy;  Laterality: N/A;   [place]     HERNIA REPAIR  2011   Temporal Area Excision Biopsy     For Birth Mark Changes, Negative Pathology   TONSILLECTOMY AND ADENOIDECTOMY     Family History  Problem Relation Age of Onset   Cancer Mother        brain tumor   Kidney disease Mother    Hypertension Mother    Heart disease Father    COPD Father    Hypertension Sister    Arthritis Sister    Cancer Sister    Heart murmur Sister    GER disease Sister    Lupus Sister    Diabetes Sister    Arthritis Sister    Osteopenia Sister    Heart disease Sister    Hypertension Sister    Breast  cancer Maternal Aunt 20   Leukemia Maternal Aunt    Social History   Socioeconomic History   Marital status: Divorced    Spouse name: Not on file   Number of children: 0   Years of education: Not on file   Highest education level: 12th grade  Occupational History   Occupation: Retired  Tobacco Use   Smoking status: Former    Packs/day: 2.00    Years: 15.00    Pack years: 30.00    Types: Cigarettes    Quit date: 1983    Years since quitting: 39.7   Smokeless tobacco: Never   Tobacco comments:    smoking cessation materials not required  Vaping Use   Vaping Use: Never used  Substance and Sexual Activity   Alcohol use: Not Currently    Alcohol/week: 0.0 standard drinks   Drug use: No   Sexual activity: Not  Currently  Other Topics Concern   Not on file  Social History Narrative   Patient just moved in with her youngest sister and a roommate    Social Determinants of Radio broadcast assistant Strain: Low Risk    Difficulty of Paying Living Expenses: Not hard at all  Food Insecurity: No Food Insecurity   Worried About Charity fundraiser in the Last Year: Never true   Arboriculturist in the Last Year: Never true  Transportation Needs: No Transportation Needs   Lack of Transportation (Medical): No   Lack of Transportation (Non-Medical): No  Physical Activity: Inactive   Days of Exercise per Week: 0 days   Minutes of Exercise per Session: 0 min  Stress: No Stress Concern Present   Feeling of Stress : Only a little  Social Connections: Socially Isolated   Frequency of Communication with Friends and Family: More than three times a week   Frequency of Social Gatherings with Friends and Family: More than three times a week   Attends Religious Services: Never   Marine scientist or Organizations: No   Attends Music therapist: Never   Marital Status: Divorced    Tobacco Counseling Counseling given: Not Answered Tobacco comments: smoking cessation  materials not required   Clinical Intake:  Pre-visit preparation completed: Yes  Pain : 0-10 Pain Score: 6  Pain Type: Chronic pain Pain Location: Leg Pain Orientation: Left Pain Descriptors / Indicators: Aching, Sore, Dull Pain Onset: More than a month ago Pain Frequency: Constant     BMI - recorded: 47.28 Nutritional Status: BMI > 30  Obese Nutritional Risks: None Diabetes: Yes CBG done?: No Did pt. bring in CBG monitor from home?: No  How often do you need to have someone help you when you read instructions, pamphlets, or other written materials from your doctor or pharmacy?: 1 - Never  Nutrition Risk Assessment:  Has the patient had any N/V/D within the last 2 months?  No  Does the patient have any non-healing wounds?  No  Has the patient had any unintentional weight loss or weight gain?  No   Diabetes:  Is the patient diabetic?  Yes  If diabetic, was a CBG obtained today?  No  Did the patient bring in their glucometer from home?  No  How often do you monitor your CBG's? Every other day.   Financial Strains and Diabetes Management:  Are you having any financial strains with the device, your supplies or your medication? No .  Does the patient want to be seen by Chronic Care Management for management of their diabetes?  No  Would the patient like to be referred to a Nutritionist or for Diabetic Management?  No   Diabetic Exams:  Diabetic Eye Exam: Completed 06/11/20 negative retinopathy. Pt aware to schedule appt.   Diabetic Foot Exam: Completed 08/12/20.   Interpreter Needed?: No  Information entered by :: Clemetine Marker LPN   Activities of Daily Living In your present state of health, do you have any difficulty performing the following activities: 06/24/2021 04/14/2021  Hearing? N N  Vision? N Y  Difficulty concentrating or making decisions? N N  Walking or climbing stairs? Y Y  Dressing or bathing? N N  Doing errands, shopping? N Y  Conservation officer, nature and  eating ? N -  Using the Toilet? N -  In the past six months, have you accidently leaked urine? Y -  Comment wears pads/depends if  needed -  Do you have problems with loss of bowel control? N -  Managing your Medications? N -  Managing your Finances? N -  Housekeeping or managing your Housekeeping? N -  Some recent data might be hidden    Patient Care Team: Steele Sizer, MD as PCP - General (Family Medicine) Bryson Ha, OD as Consulting Physician (Optometry) Sharlet Salina, MD as Consulting Physician (Physical Medicine and Rehabilitation) Germaine Pomfret, [facility] as Pharmacist (Pharmacist)  Indicate any recent Medical Services you may have received from other than [facility] providers in the past year (date may be approximate).     Assessment:   This is a routine wellness examination for Mieka.  Hearing/Vision screen Hearing Screening - Comments:: Pt denies hearing difficulty Vision Screening - Comments:: Annual vision screenings with [facility]in [facility]  Dietary issues and exercise activities discussed: Current Exercise Habits: The patient does not participate in regular exercise at present, Exercise limited by: orthopedic condition(s)   Goals Addressed   None    Depression Screen PHQ 2/9 Scores 06/24/2021 04/14/2021 12/13/2020 09/10/2020 08/12/2020 06/20/2020 04/12/2020  PHQ - 2 Score 0 0 0 0 0 0 0  PHQ- 9 Score - - - 1 - - 0    Fall Risk Fall Risk  06/24/2021 04/14/2021 12/13/2020 08/12/2020 06/20/2020  Falls in the past year? 0 0 0 0 0  Number falls in past yr: 0 0 0 0 0  Injury with Fall? 0 0 0 0 0  Risk for fall due to : Impaired balance/gait - - - Impaired mobility;Impaired balance/gait  Risk for fall due to: Comment - - - - -  Follow up Falls prevention discussed - - - Falls prevention discussed    FALL RISK PREVENTION PERTAINING TO THE HOME:  Any stairs in or around the home? No  If so, are there any without handrails? No  Home free of loose throw rugs in  walkways, pet beds, electrical cords, etc? Yes  Adequate lighting in your home to reduce risk of falls? Yes   ASSISTIVE DEVICES UTILIZED TO PREVENT FALLS:  Life alert? No  Use of a cane, walker or w/c? Yes  Grab bars in the bathroom? Yes  Shower chair or bench in shower? Yes  Elevated toilet seat or a handicapped toilet? Yes   TIMED UP AND GO:  Was the test performed? Yes .  Length of time to ambulate 10 feet: 7 sec.   Gait slow and steady with assistive device  Cognitive Function: Normal cognitive status assessed by direct observation by this Nurse Health Advisor. No abnormalities found.       6CIT Screen 06/20/2020 06/20/2019 06/17/2018 06/14/2017  What Year? 0 points 0 points 0 points 0 points  What month? 0 points 0 points 0 points 0 points  What time? 0 points 0 points 0 points 0 points  Count back from 20 0 points 0 points 0 points 0 points  Months in reverse 0 points 0 points 0 points 4 points  Repeat phrase 0 points 0 points 2 points 4 points  Total Score 0 0 2 8    Immunizations Immunization History  Administered Date(s) Administered   Fluad Quad(high Dose 65+) 06/20/2020, 06/24/2021   Influenza, High Dose Seasonal PF 06/19/2015, 06/29/2016, 06/14/2017, 06/17/2018, 06/22/2019   Influenza-Unspecified 06/17/2011, 07/11/2019   PFIZER(Purple Top)SARS-COV-2 Vaccination 12/11/2019, 01/01/2020, 07/16/2020, 02/03/2021   Pneumococcal Conjugate-13 11/24/2011, 11/23/2013, 04/09/2017   Pneumococcal Polysaccharide-23 08/30/2009, 08/30/2009   Td 08/30/2009   Tdap 08/30/2009, 08/12/2020  Zoster Recombinat (Shingrix) 07/11/2019, 09/21/2019   Zoster, Live 02/12/2011    TDAP status: Up to date  Flu Vaccine status: Completed at today's visit  Pneumococcal vaccine status: Up to date  Covid-19 vaccine status: Completed vaccines  Qualifies for Shingles Vaccine? Yes   Zostavax completed Yes   Shingrix Completed?: Yes  Screening Tests Health Maintenance  Topic Date Due    COVID-19 Vaccine (5 - Booster for Pfizer series) 06/05/2021   OPHTHALMOLOGY EXAM  06/11/2021   FOOT EXAM  08/12/2021   HEMOGLOBIN A1C  10/14/2021   MAMMOGRAM  12/18/2021   COLONOSCOPY (Pts 45-37yr Insurance coverage will need to be confirmed)  01/20/2026   TETANUS/TDAP  08/12/2030   INFLUENZA VACCINE  Completed   DEXA SCAN  Completed   Hepatitis C Screening  Completed   PNA vac Low Risk Adult  Completed   Zoster Vaccines- Shingrix  Completed   HPV VACCINES  Aged Out    Health Maintenance  Health Maintenance Due  Topic Date Due   COVID-19 Vaccine (5 - Booster for [place]) 06/05/2021   OPHTHALMOLOGY EXAM  06/11/2021    Colorectal cancer screening: Type of screening: Colonoscopy. Completed 01/21/16. Repeat every 10 years  Mammogram status: Completed 12/18/20. Repeat every year  Bone Density status: Completed 08/16/19. Results reflect: Bone density results: OSTEOPENIA. Repeat every 2 years.  Lung Cancer Screening: (Low Dose CT Chest recommended if Age 74-80years, 30 pack-year currently smoking OR have quit w/in 15years.) does not qualify.   Additional Screening:  Hepatitis C Screening: does qualify; Completed 07/14/12  Vision Screening: Recommended annual ophthalmology exams for early detection of glaucoma and other disorders of the eye. Is the patient up to date with their annual eye exam?  Yes  Who is the provider or what is the name of the office in which the patient attends annual eye exams? Dr. RKerin Ransom   Dental Screening: Recommended annual dental exams for proper oral hygiene  Community Resource Referral / Chronic Care Management: CRR required this visit?  No   CCM required this visit?  No      Plan:     I have personally reviewed and noted the following in the patient's chart:   Medical and social history Use of alcohol, tobacco or illicit drugs  Current medications and supplements including opioid prescriptions.  Functional ability and  status Nutritional status Physical activity Advanced directives List of other physicians Hospitalizations, surgeries, and ER visits in previous 12 months Vitals Screenings to include cognitive, depression, and falls Referrals and appointments  In addition, I have reviewed and discussed with patient certain preventive protocols, quality metrics, and best practice recommendations. A written personalized care plan for preventive services as well as general preventive health recommendations were provided to patient.     KClemetine Marker LPN   [id number]  Nurse Notes: pt state she needs refills for one touch verio test strips and diclofenac gel; both sent to [facility]. Thank you!

## 2021-06-24 NOTE — Patient Instructions (Signed)
[name] , Thank you for taking time to come for your Medicare Wellness Visit. I appreciate your ongoing commitment to your health goals. Please review the following plan we discussed and let me know if I can assist you in the future.   Screening recommendations/referrals: Colonoscopy: done 01/21/16. Repeat 01/2026 Mammogram: done 12/18/20 Bone Density: done 08/16/19 Recommended yearly ophthalmology/optometry visit for glaucoma screening and checkup Recommended yearly dental visit for hygiene and checkup  Vaccinations: Influenza vaccine: done today Pneumococcal vaccine: done 04/09/17 Tdap vaccine: done 08/12/20 Shingles vaccine: done 07/11/19 & 09/21/19 Covid-19: done 12/11/19, 01/01/20, 07/16/20 & 02/03/21    Conditions/risks identified: Recommend continuing fall prevention in the home  Next appointment: Follow up in one year for your annual wellness visit    Preventive Care 74 Years and Older, Female Preventive care refers to lifestyle choices and visits with your health care provider that can promote health and wellness. What does preventive care include? A yearly physical exam. This is also called an annual well check. Dental exams once or twice a year. Routine eye exams. Ask your health care provider how often you should have your eyes checked. Personal lifestyle choices, including: Daily care of your teeth and gums. Regular physical activity. Eating a healthy diet. Avoiding tobacco and drug use. Limiting alcohol use. Practicing safe sex. Taking low-dose aspirin every day. Taking vitamin and mineral supplements as recommended by your health care provider. What happens during an annual well check? The services and screenings done by your health care provider during your annual well check will depend on your age, overall health, lifestyle risk factors, and family history of disease. Counseling  Your health care provider may ask you questions about your: Alcohol use. Tobacco  use. Drug use. Emotional well-being. Home and relationship well-being. Sexual activity. Eating habits. History of falls. Memory and ability to understand (cognition). Work and work Statistician. Reproductive health. Screening  You may have the following tests or measurements: Height, weight, and BMI. Blood pressure. Lipid and cholesterol levels. These may be checked every 5 years, or more frequently if you are over 74 years old. Skin check. Lung cancer screening. You may have this screening every year starting at age 74 if you have a 30-pack-year history of smoking and currently smoke or have quit within the past 15 years. Fecal occult blood test (FOBT) of the stool. You may have this test every year starting at age 74 Flexible sigmoidoscopy or colonoscopy. You may have a sigmoidoscopy every 5 years or a colonoscopy every 10 years starting at age 74 Hepatitis C blood test. Hepatitis B blood test. Sexually transmitted disease (STD) testing. Diabetes screening. This is done by checking your blood sugar (glucose) after you have not eaten for a while (fasting). You may have this done every 1-3 years. Bone density scan. This is done to screen for osteoporosis. You may have this done starting at age 74. Mammogram. This may be done every 1-2 years. Talk to your health care provider about how often you should have regular mammograms. Talk with your health care provider about your test results, treatment options, and if necessary, the need for more tests. Vaccines  Your health care provider may recommend certain vaccines, such as: Influenza vaccine. This is recommended every year. Tetanus, diphtheria, and acellular pertussis (Tdap, Td) vaccine. You may need a Td booster every 10 years. Zoster vaccine. You may need this after age 74. Pneumococcal 13-valent conjugate (PCV13) vaccine. One dose is recommended after age 74. Pneumococcal polysaccharide (PPSV23) vaccine.  One dose is recommended  after age 74. Talk to your health care provider about which screenings and vaccines you need and how often you need them. This information is not intended to replace advice given to you by your health care provider. Make sure you discuss any questions you have with your health care provider. Document Released: 11/01/2015 Document Revised: 06/24/2016 Document Reviewed: 08/06/2015 Elsevier Interactive Patient Education  2017 [place] Prevention in the Home Falls can cause injuries. They can happen to people of all ages. There are many things you can do to make your home safe and to help prevent falls. What can I do on the outside of my home? Regularly fix the edges of walkways and driveways and fix any cracks. Remove anything that might make you trip as you walk through a door, such as a raised step or threshold. Trim any bushes or trees on the path to your home. Use bright outdoor lighting. Clear any walking paths of anything that might make someone trip, such as rocks or tools. Regularly check to see if handrails are loose or broken. Make sure that both sides of any steps have handrails. Any raised decks and porches should have guardrails on the edges. Have any leaves, snow, or ice cleared regularly. Use sand or salt on walking paths during winter. Clean up any spills in your garage right away. This includes oil or grease spills. What can I do in the bathroom? Use night lights. Install grab bars by the toilet and in the tub and shower. Do not use towel bars as grab bars. Use non-skid mats or decals in the tub or shower. If you need to sit down in the shower, use a plastic, non-slip stool. Keep the floor dry. Clean up any water that spills on the floor as soon as it happens. Remove soap buildup in the tub or shower regularly. Attach bath mats securely with double-sided non-slip rug tape. Do not have throw rugs and other things on the floor that can make you trip. What can I do  in the bedroom? Use night lights. Make sure that you have a light by your bed that is easy to reach. Do not use any sheets or blankets that are too big for your bed. They should not hang down onto the floor. Have a firm chair that has side arms. You can use this for support while you get dressed. Do not have throw rugs and other things on the floor that can make you trip. What can I do in the kitchen? Clean up any spills right away. Avoid walking on wet floors. Keep items that you use a lot in easy-to-reach places. If you need to reach something above you, use a strong step stool that has a grab bar. Keep electrical cords out of the way. Do not use floor polish or wax that makes floors slippery. If you must use wax, use non-skid floor wax. Do not have throw rugs and other things on the floor that can make you trip. What can I do with my stairs? Do not leave any items on the stairs. Make sure that there are handrails on both sides of the stairs and use them. Fix handrails that are broken or loose. Make sure that handrails are as long as the stairways. Check any carpeting to make sure that it is firmly attached to the stairs. Fix any carpet that is loose or worn. Avoid having throw rugs at the top or bottom of  the stairs. If you do have throw rugs, attach them to the floor with carpet tape. Make sure that you have a light switch at the top of the stairs and the bottom of the stairs. If you do not have them, ask someone to add them for you. What else can I do to help prevent falls? Wear shoes that: Do not have high heels. Have rubber bottoms. Are comfortable and fit you well. Are closed at the toe. Do not wear sandals. If you use a stepladder: Make sure that it is fully opened. Do not climb a closed stepladder. Make sure that both sides of the stepladder are locked into place. Ask someone to hold it for you, if possible. Clearly mark and make sure that you can see: Any grab bars or  handrails. First and last steps. Where the edge of each step is. Use tools that help you move around (mobility aids) if they are needed. These include: Canes. Walkers. Scooters. Crutches. Turn on the lights when you go into a dark area. Replace any light bulbs as soon as they burn out. Set up your furniture so you have a clear path. Avoid moving your furniture around. If any of your floors are uneven, fix them. If there are any pets around you, be aware of where they are. Review your medicines with your doctor. Some medicines can make you feel dizzy. This can increase your chance of falling. Ask your doctor what other things that you can do to help prevent falls. This information is not intended to replace advice given to you by your health care provider. Make sure you discuss any questions you have with your health care provider. Document Released: 08/01/2009 Document Revised: 03/12/2016 Document Reviewed: 11/09/2014 Elsevier Interactive Patient Education  2017 [employer].

## 2021-06-25 ENCOUNTER — Other Ambulatory Visit: Payer: Self-pay | Admitting: Family Medicine

## 2021-06-25 DIAGNOSIS — M1990 Unspecified osteoarthritis, unspecified site: Secondary | ICD-10-CM

## 2021-06-25 MED ORDER — DICLOFENAC SODIUM 1 % EX GEL: CUTANEOUS | 0 refills | Status: DC

## 2021-06-25 MED ORDER — ONETOUCH VERIO VI STRP: ORAL_STRIP | 1 refills | Status: DC

## 2021-06-25 NOTE — Telephone Encounter (Signed)
Copied from [place] [id number]. Topic: Quick Communication - Rx Refill/Question >> Jun 25, 2021  9:01 AM Tessa Lerner A wrote: Medication: diclofenac Sodium (VOLTAREN) 1 % GEL [id number]   glucose blood (ONETOUCH VERIO) test strip [id number]   Has the patient contacted their pharmacy? Yes.   (Agent: If no, request that the patient contact the pharmacy for the refill.) (Agent: If yes, when and what did the pharmacy advise?)  Preferred Pharmacy (with phone number or street name): [facility] [place], [place]  Phone:  [phone] Fax:  [phone]  Agent: Please be advised that RX refills may take up to 3 business days. We ask that you follow-up with your pharmacy.

## 2021-06-25 NOTE — Telephone Encounter (Signed)
Valid encounter, future visit in 1 month . Patient requesting medication to be sent to different pharmacy . Mail order

## 2021-06-25 NOTE — Telephone Encounter (Signed)
Medication refill requests sent to different pharmacy than patient requesting. Contacted patient and she would like medications sent to [facility] mail order [employer] .

## 2021-07-04 ENCOUNTER — Other Ambulatory Visit: Payer: Self-pay | Admitting: Family Medicine

## 2021-07-04 DIAGNOSIS — G8929 Other chronic pain: Secondary | ICD-10-CM

## 2021-07-04 DIAGNOSIS — M5416 Radiculopathy, lumbar region: Secondary | ICD-10-CM | POA: Diagnosis not present

## 2021-07-04 DIAGNOSIS — M5442 Lumbago with sciatica, left side: Secondary | ICD-10-CM

## 2021-07-04 DIAGNOSIS — M5136 Other intervertebral disc degeneration, lumbar region: Secondary | ICD-10-CM | POA: Diagnosis not present

## 2021-07-04 DIAGNOSIS — M48062 Spinal stenosis, lumbar region with neurogenic claudication: Secondary | ICD-10-CM | POA: Diagnosis not present

## 2021-07-14 ENCOUNTER — Other Ambulatory Visit: Payer: Self-pay

## 2021-07-14 ENCOUNTER — Ambulatory Visit
Admission: RE | Admit: 2021-07-14 | Discharge: 2021-07-14 | Disposition: A | Discharge status: Home / Self Care | Payer: Medicare Other | Source: Ambulatory Visit | Attending: Family Medicine | Admitting: Family Medicine

## 2021-07-14 DIAGNOSIS — M4316 Spondylolisthesis, lumbar region: Secondary | ICD-10-CM | POA: Diagnosis not present

## 2021-07-14 DIAGNOSIS — M5442 Lumbago with sciatica, left side: Secondary | ICD-10-CM | POA: Diagnosis present

## 2021-07-14 DIAGNOSIS — M47816 Spondylosis without myelopathy or radiculopathy, lumbar region: Secondary | ICD-10-CM | POA: Diagnosis not present

## 2021-07-14 DIAGNOSIS — M5126 Other intervertebral disc displacement, lumbar region: Secondary | ICD-10-CM | POA: Diagnosis not present

## 2021-07-14 DIAGNOSIS — G8929 Other chronic pain: Secondary | ICD-10-CM | POA: Insufficient documentation

## 2021-07-17 DIAGNOSIS — J449 Chronic obstructive pulmonary disease, unspecified: Secondary | ICD-10-CM | POA: Diagnosis not present

## 2021-08-12 ENCOUNTER — Telehealth: Payer: Self-pay

## 2021-08-12 NOTE — Progress Notes (Signed)
    Chronic Care Management Pharmacy Assistant   Name: Amber Avery  MRN: [id number] DOB: 07/19/1946  Patient Assistance Renewal Application  Patient is currently on patient assistance with [facility] for her Trulicity medication. [employer] request that a renewal application for this medication be sent in to the by 10/18/2021 in order for the patient to receive the medication for the 2023 year.  I will start the renewal process for this patient today. Once the application has been started I will e-mail it to [facility] for printing and providing to patient so she can complete her portion of the application. Once she has completed her portion she will be informed to turn the application into the clinic so [facility] can fax it over to [facility] @ [phone] for processing.  Spoke with the patient and she advises she has an appointment at the clinic on 08/15/2021. I will e-mail the application to the [facility] for him to print and leave at the front desk for the patient. I informed the patient that this application has to be turned in prior to 10/18/2021 in order to be approved for the 2023 year. Patient verbalized understanding.   Application e-mailed to [facility].  Medications: Outpatient Encounter Medications as of 08/12/2021  Medication Sig Note   acetaminophen (TYLENOL) 500 MG tablet Take 1 tablet (500 mg total) by mouth every 6 (six) hours as needed. (Patient taking differently: Take 1,000 mg by mouth every 8 (eight) hours.)    albuterol (ACCUNEB) 1.25 MG/3ML nebulizer solution Take 3 mLs (1.25 mg total) by nebulization 2 (two) times daily. (Patient not taking: Reported on 06/24/2021) 10/20/2018: Patient has not needed to use nebulizer   albuterol (VENTOLIN HFA) 108 (90 Base) MCG/ACT inhaler Inhale 2 puffs into the lungs every 6 (six) hours as needed for wheezing or shortness of breath. (Patient not taking: Reported on 06/24/2021)    allopurinol (ZYLOPRIM) 300 MG tablet Take 1 tablet (300 mg total) by  mouth daily.    atorvastatin (LIPITOR) 40 MG tablet Daily for cholesterol    benazepril (LOTENSIN) 5 MG tablet Take 1 tablet (5 mg total) by mouth daily.    diclofenac Sodium (VOLTAREN) 1 % GEL APPLY 4 GRAMS TOPICALLY 4 TIMES DAILY AS DIRECTED    Dulaglutide (TRULICITY) 1.5 PF/7.9KW SOPN Inject 1.5 mg into the skin once a week.    furosemide (LASIX) 40 MG tablet Take 1 tablet (40 mg total) by mouth daily.    gabapentin (NEURONTIN) 300 MG capsule TAKE 1 CAPSULE BY MOUTH IN THE MORNING AND 1 IN THE EVENING AND 2 AT NIGHT    glucose blood (ONETOUCH VERIO) test strip USE AS DIRECTED    Lancets (ONETOUCH ULTRASOFT) lancets Use as instructed    potassium chloride SA (KLOR-CON M20) 20 MEQ tablet Take 1 tablet (20 mEq total) by mouth daily.    triamcinolone (KENALOG) 0.1 % Apply topically. (Patient not taking: Reported on 06/24/2021)    triamcinolone ointment (KENALOG) 0.1 % APPLY TOPICALLY TWICE DAILY AS NEEDED    vitamin B-12 (CYANOCOBALAMIN) 500 MCG tablet Take 500 mcg by mouth daily.    Vitamin D, Cholecalciferol, 25 MCG (1000 UT) TABS Take 1 capsule by mouth daily.    No facility-administered encounter medications on file as of 08/12/2021.   Lynann Bologna, CPA/CMA Clinical Pharmacist Assistant Phone: [phone]

## 2021-08-14 NOTE — Progress Notes (Signed)
Name: Amber Avery   MRN: [id number]    DOB: November 28, 1945   Date:08/15/2021       Progress Note  Subjective  Chief Complaint  Follow up   HPI  Breast pain: improved since she started to wear sports bra and sleeping weraing it also   HTN:   She is now on benazepril 5 mg, used to be on 20 mg but it was causing dizziness. Denies chest pain or palpitation   Dyslipidemia: taking statin therapy, no side effects, no chest pain or myalgia. Last LDL was at goal and discussed with patient , recheck it yearly    Asthma Mild intermittent: she states she has been doing well, no wheezing, cough , mild SOB with activity but likely multifactorial,  DMII: . She is on Trulicity, no polyphagia, polyuria (stable because of diuretic) denies  polydipsia . She has dyslipidemia and is doing well on statin therapy , CKI and is on low dose ACE, also has neuropathy that is stable on gabapentin. A1C today was at goal, last LDL also at goal     Obesity: weigth is stable since last visit. She is under stress at home, planning on moving out of her sisters house to an apartment     OSA: she wear machine every night, she uses oxygen with CPAP and is compliant.     OA : she saw Ortho , Dr. Candelaria Stagers, and he advised her not to have surgery because of her weight, she had one round of hyaluronic injection without help and it was too expensive . She is still taking Tylenol She still has intermittent right knee effusion when more active, no redness or increase in warmth. She has a hoveround, uses walker and sometimes cane. She states her back is the worse symptom today     Iron deficiency anemia: last level was better, under the care of Dr. Tasia Catchings and had 5 iron infusions,  normal HCT  02/22 and ferritin also improved , last visit with GI was with Dr. Allen Norris - GI in 2020       Senile purpura: stable , reassurance given . Unchanged    Left sciatica: she is having a lot of pain over the past two months, shooting down to her left  foot now, having difficult walking, she has a hoover round and is seeing Dr. Nickola Major again   Patient Active Problem List   Diagnosis Date Noted   Gait instability 12/13/2020   Stage 3a chronic kidney disease ([place]) 12/13/2020   Cellulitis of right ankle 04/12/2020   Morbid obesity with BMI of 45.0-49.9, adult ([place]) 02/06/2020   History of gastritis 08/14/2019   Angiodysplasia of stomach and duodenum    Iron deficiency anemia 04/20/2018   Anemia, unspecified 39/76/7341   Lichen sclerosus of female genitalia 06/29/2016   Primary osteoarthritis of both knees 04/07/2016   Special screening for malignant neoplasms, colon    Right thyroid nodule 06/26/2015   Asthma, intermittent 04/03/2015   Benign essential HTN 04/03/2015   Carpal tunnel syndrome 04/03/2015   Chronic kidney disease (CKD), stage II (mild) 04/03/2015   Controlled gout 04/03/2015   Arteriosclerosis of coronary artery 04/03/2015   Diabetes mellitus with renal manifestations, controlled ([place]) 04/03/2015   Dyslipidemia 04/03/2015   Edema extremities 04/03/2015   Elevated sedimentation rate 04/03/2015   Knee pain 04/03/2015   Lumbar radiculitis 04/03/2015   Obstructive apnea 04/03/2015   Lumbosacral spondylosis 04/03/2015   Osteoarthritis, chronic 04/03/2015   Psoriasis 04/03/2015   Vitamin  D deficiency 04/03/2015   Varicose veins 04/03/2015   Goiter 12/23/2014   Degeneration of intervertebral disc of lumbar region 08/23/2014   Corns and callosity 06/11/2009    Past Surgical History:  Procedure Laterality Date   APPENDECTOMY     CATARACT EXTRACTION W/PHACO Left 08/14/2020   Procedure: CATARACT EXTRACTION PHACO AND INTRAOCULAR LENS PLACEMENT (IOC) LEFT DIABETIC 3.56 00:59.6 6.0%;  Surgeon: Leandrew Koyanagi, MD;  Location: [place];  Service: Ophthalmology;  Laterality: Left;  Diabetic   CATARACT EXTRACTION W/PHACO Right 09/04/2020   Procedure: CATARACT EXTRACTION PHACO AND INTRAOCULAR LENS PLACEMENT  ([place]) RIGHT DIABETIC;  Surgeon: Leandrew Koyanagi, MD;  Location: [place];  Service: Ophthalmology;  Laterality: Right;  4.72 1:03.2 7.4%   CHOLECYSTECTOMY  1977   COLONOSCOPY WITH PROPOFOL N/A 01/21/2016   Procedure: COLONOSCOPY WITH PROPOFOL;  Surgeon: Lucilla Lame, MD;  Location: [facility];  Service: Endoscopy;  Laterality: N/A;   DILATION AND CURETTAGE OF UTERUS     Due to Amenorrhea   ESOPHAGOGASTRODUODENOSCOPY (EGD) WITH PROPOFOL N/A 08/16/2018   Procedure: ESOPHAGOGASTRODUODENOSCOPY (EGD) WITH PROPOFOL;  Surgeon: Lucilla Lame, MD;  Location: [facility];  Service: Endoscopy;  Laterality: N/A;   [place]     HERNIA REPAIR  2011   Temporal Area Excision Biopsy     For Birth Mark Changes, Negative Pathology   TONSILLECTOMY AND ADENOIDECTOMY      Family History  Problem Relation Age of Onset   Cancer Mother        brain tumor   Kidney disease Mother    Hypertension Mother    Heart disease Father    COPD Father    Hypertension Sister    Arthritis Sister    Cancer Sister    Heart murmur Sister    GER disease Sister    Lupus Sister    Diabetes Sister    Arthritis Sister    Osteopenia Sister    Heart disease Sister    Hypertension Sister    Breast cancer Maternal Aunt 57   Leukemia Maternal Aunt     Social History   Tobacco Use   Smoking status: Former    Packs/day: 2.00    Years: 15.00    Pack years: 30.00    Types: Cigarettes    Quit date: 1983    Years since quitting: 39.8   Smokeless tobacco: Never   Tobacco comments:    smoking cessation materials not required  Substance Use Topics   Alcohol use: Not Currently    Alcohol/week: 0.0 standard drinks     Current Outpatient Medications:    acetaminophen (TYLENOL) 500 MG tablet, Take 1 tablet (500 mg total) by mouth every 6 (six) hours as needed. (Patient taking differently: Take 1,000 mg by mouth every 8 (eight) hours.), Disp: 480 tablet, Rfl: 0    allopurinol (ZYLOPRIM) 300 MG tablet, Take 1 tablet (300 mg total) by mouth daily., Disp: 90 tablet, Rfl: 3   atorvastatin (LIPITOR) 40 MG tablet, Daily for cholesterol, Disp: 90 tablet, Rfl: 3   benazepril (LOTENSIN) 5 MG tablet, Take 1 tablet (5 mg total) by mouth daily., Disp: 90 tablet, Rfl: 3   Dulaglutide (TRULICITY) 1.5 YH/0.6CB SOPN, Inject 1.5 mg into the skin once a week., Disp: 12 pen, Rfl: 3   furosemide (LASIX) 40 MG tablet, Take 1 tablet (40 mg total) by mouth daily., Disp: 90 tablet, Rfl: 3   gabapentin (NEURONTIN) 300 MG capsule, TAKE 1 CAPSULE BY MOUTH IN  THE MORNING AND 1 IN THE EVENING AND 2 AT NIGHT, Disp: 360 capsule, Rfl: 3   glucose blood (ONETOUCH VERIO) test strip, USE AS DIRECTED, Disp: 50 each, Rfl: 1   Lancets (ONETOUCH ULTRASOFT) lancets, Use as instructed, Disp: 100 each, Rfl: 12   potassium chloride SA (KLOR-CON M20) 20 MEQ tablet, Take 1 tablet (20 mEq total) by mouth daily., Disp: 90 tablet, Rfl: 3   triamcinolone ointment (KENALOG) 0.1 %, APPLY TOPICALLY TWICE DAILY AS NEEDED, Disp: 90 g, Rfl: 0   vitamin B-12 (CYANOCOBALAMIN) 500 MCG tablet, Take 500 mcg by mouth daily., Disp: , Rfl:    Vitamin D, Cholecalciferol, 25 MCG (1000 UT) TABS, Take 1 capsule by mouth daily., Disp: 90 tablet, Rfl: 1   albuterol (ACCUNEB) 1.25 MG/3ML nebulizer solution, Take 3 mLs (1.25 mg total) by nebulization 2 (two) times daily. (Patient not taking: No sig reported), Disp: 75 mL, Rfl: 2   albuterol (VENTOLIN HFA) 108 (90 Base) MCG/ACT inhaler, Inhale 2 puffs into the lungs every 6 (six) hours as needed for wheezing or shortness of breath. (Patient not taking: No sig reported), Disp: 8 g, Rfl: 0   diclofenac Sodium (VOLTAREN) 1 % GEL, APPLY 4 GRAMS TOPICALLY 4 TIMES DAILY AS DIRECTED, Disp: 300 g, Rfl: 3   triamcinolone (KENALOG) 0.1 %, Apply topically. (Patient not taking: No sig reported), Disp: , Rfl:   Allergies  Allergen Reactions   Codeine Hives and Nausea And Vomiting    I  personally reviewed active problem list, medication list, allergies, family history, social history, health maintenance with the patient/caregiver today.   ROS  Constitutional: Negative for fever or weight change.  Respiratory: Negative for cough and shortness of breath.   Cardiovascular: Negative for chest pain or palpitations.  Gastrointestinal: Negative for abdominal pain, no bowel changes.  Musculoskeletal: positive for gait problem but no  joint swelling.  Skin: Negative for rash. She has a tender spot on vulva area  Neurological: Negative for dizziness or headache.  No other specific complaints in a complete review of systems (except as listed in HPI above).   Objective  Vitals:   08/15/21 1205  BP: 134/72  Pulse: 90  Resp: 16  Temp: 97.9 F (36.6 C)  TempSrc: Oral  SpO2: 94%  Weight: 260 lb 12.8 oz (118.3 kg)  Height: 5\' 2"  (1.575 m)    Body mass index is 47.7 kg/m.  Physical Exam  Constitutional: Patient appears well-developed and well-nourished. Obese  No distress.  HEENT: head atraumatic, normocephalic, pupils equal and reactive to light,  neck supple Cardiovascular: Normal rate, regular rhythm and normal heart sounds.  No murmur heard. Trace  BLE edema. Pulmonary/Chest: Effort normal and breath sounds normal. No respiratory distress. Abdominal: Soft.  There is no tenderness. Vulva exam: ulceration on left labia majora, tender to touch Psychiatric: Patient has a normal mood and affect. behavior is normal. Judgment and thought content normal.   Recent Results (from the past 2160 hour(s))  POCT HgB A1C     Status: Abnormal   Collection Time: 08/15/21 12:13 PM  Result Value Ref Range   Hemoglobin A1C 6.3 (A) 4.0 - 5.6 %   HbA1c POC (<> result, manual entry)     HbA1c, POC (prediabetic range)     HbA1c, POC (controlled diabetic range)      Diabetic Foot Exam: Diabetic Foot Exam - Simple   Simple Foot Form Diabetic Foot exam was performed with the following  findings: Yes 08/15/2021 12:24 PM  Visual  Inspection No deformities, no ulcerations, no other skin breakdown bilaterally: Yes Sensation Testing Intact to touch and monofilament testing bilaterally: Yes Pulse Check Posterior Tibialis and Dorsalis pulse intact bilaterally: Yes Comments      PHQ2/9: Depression screen [facility] 2/9 08/15/2021 06/24/2021 04/14/2021 12/13/2020 09/10/2020  Decreased Interest 0 0 0 0 0  Down, Depressed, Hopeless 0 0 0 0 0  PHQ - 2 Score 0 0 0 0 0  Altered sleeping - - - - 0  Tired, decreased energy - - - - 1  Change in appetite - - - - 0  Feeling bad or failure about yourself  - - - - 0  Trouble concentrating - - - - 0  Moving slowly or fidgety/restless - - - - 0  Suicidal thoughts - - - - 0  PHQ-9 Score - - - - 1  Difficult doing work/chores - - - - Not difficult at all  Some recent data might be hidden    phq 9 is negative   Fall Risk: Fall Risk  08/15/2021 06/24/2021 04/14/2021 12/13/2020 08/12/2020  Falls in the past year? 0 0 0 0 0  Number falls in past yr: - 0 0 0 0  Injury with Fall? - 0 0 0 0  Risk for fall due to : - Impaired balance/gait - - -  Risk for fall due to: Comment - - - - -  Follow up Falls prevention discussed Falls prevention discussed - - -     Functional Status Survey: Is the patient deaf or have difficulty hearing?: No Does the patient have difficulty seeing, even when wearing glasses/contacts?: No Does the patient have difficulty concentrating, remembering, or making decisions?: No Does the patient have difficulty walking or climbing stairs?: Yes Does the patient have difficulty dressing or bathing?: No Does the patient have difficulty doing errands alone such as visiting a doctor's office or shopping?: No    Assessment & Plan  1. Controlled type 2 diabetes mellitus with stage 3 chronic kidney disease, without long-term current use of insulin (HCC)  - POCT HgB A1C  2. Stage 3a chronic kidney disease ([place])   3. Morbid  obesity with BMI of 45.0-49.9, adult [facility])  Discussed with the patient the risk posed by an increased BMI. Discussed importance of portion control, calorie counting and at least 150 minutes of physical activity weekly. Avoid sweet beverages and drink more water. Eat at least 6 servings of fruit and vegetables daily    4. Senile purpura (HCC)  Stable - reassurance given   5. Hypertension associated with type 2 diabetes mellitus (HCC)  Continue low dose ACE  6. Bilateral edema of lower extremity  Stable   7. Benign essential HTN  At goal now   8. Lumbar back pain with radiculopathy affecting left lower extremity  Seeing Dr. Mancel Parsons   9. Dyslipidemia   10. B12 deficiency   11. Gait instability  Using a rolling walker , she has a hoover round but needs to install a lift to her car  12. Controlled gout   13. Osteoarthritis, chronic  - diclofenac Sodium (VOLTAREN) 1 % GEL; APPLY 4 GRAMS TOPICALLY 4 TIMES DAILY AS DIRECTED  Dispense: 300 g; Refill: 3  14. Obstructive apnea   15. Ulceration, vulva  - Ambulatory referral to Obstetrics / Gynecology

## 2021-08-15 ENCOUNTER — Encounter: Payer: Self-pay | Admitting: Family Medicine

## 2021-08-15 ENCOUNTER — Ambulatory Visit (INDEPENDENT_AMBULATORY_CARE_PROVIDER_SITE_OTHER): Payer: Medicare Other | Admitting: Family Medicine

## 2021-08-15 ENCOUNTER — Other Ambulatory Visit: Payer: Self-pay

## 2021-08-15 VITALS — BP 134/72 | HR 90 | Temp 97.9°F | Resp 16 | Ht 62.0 in | Wt 260.8 lb

## 2021-08-15 DIAGNOSIS — N1831 Chronic kidney disease, stage 3a: Secondary | ICD-10-CM

## 2021-08-15 DIAGNOSIS — M109 Gout, unspecified: Secondary | ICD-10-CM | POA: Diagnosis not present

## 2021-08-15 DIAGNOSIS — E1159 Type 2 diabetes mellitus with other circulatory complications: Secondary | ICD-10-CM | POA: Diagnosis not present

## 2021-08-15 DIAGNOSIS — E538 Deficiency of other specified B group vitamins: Secondary | ICD-10-CM

## 2021-08-15 DIAGNOSIS — E785 Hyperlipidemia, unspecified: Secondary | ICD-10-CM | POA: Diagnosis not present

## 2021-08-15 DIAGNOSIS — D692 Other nonthrombocytopenic purpura: Secondary | ICD-10-CM

## 2021-08-15 DIAGNOSIS — N766 Ulceration of vulva: Secondary | ICD-10-CM

## 2021-08-15 DIAGNOSIS — G4733 Obstructive sleep apnea (adult) (pediatric): Secondary | ICD-10-CM

## 2021-08-15 DIAGNOSIS — R2681 Unsteadiness on feet: Secondary | ICD-10-CM | POA: Diagnosis not present

## 2021-08-15 DIAGNOSIS — M5416 Radiculopathy, lumbar region: Secondary | ICD-10-CM | POA: Diagnosis not present

## 2021-08-15 DIAGNOSIS — E1122 Type 2 diabetes mellitus with diabetic chronic kidney disease: Secondary | ICD-10-CM

## 2021-08-15 DIAGNOSIS — R600 Localized edema: Secondary | ICD-10-CM

## 2021-08-15 DIAGNOSIS — N183 Chronic kidney disease, stage 3 unspecified: Secondary | ICD-10-CM | POA: Diagnosis not present

## 2021-08-15 DIAGNOSIS — I152 Hypertension secondary to endocrine disorders: Secondary | ICD-10-CM

## 2021-08-15 DIAGNOSIS — I10 Essential (primary) hypertension: Secondary | ICD-10-CM

## 2021-08-15 DIAGNOSIS — M1990 Unspecified osteoarthritis, unspecified site: Secondary | ICD-10-CM

## 2021-08-15 DIAGNOSIS — Z6841 Body Mass Index (BMI) 40.0 and over, adult: Secondary | ICD-10-CM

## 2021-08-15 LAB — POCT GLYCOSYLATED HEMOGLOBIN (HGB A1C): Hemoglobin A1C: 6.3 % — AB (ref 4.0–5.6)

## 2021-08-15 MED ORDER — DICLOFENAC SODIUM 1 % EX GEL: CUTANEOUS | 3 refills | Status: DC

## 2021-08-16 DIAGNOSIS — J449 Chronic obstructive pulmonary disease, unspecified: Secondary | ICD-10-CM | POA: Diagnosis not present

## 2021-08-18 ENCOUNTER — Encounter: Payer: Self-pay | Admitting: Oncology

## 2021-08-22 ENCOUNTER — Encounter: Payer: Self-pay | Admitting: Obstetrics and Gynecology

## 2021-08-22 ENCOUNTER — Other Ambulatory Visit: Payer: Self-pay

## 2021-08-22 ENCOUNTER — Other Ambulatory Visit (HOSPITAL_COMMUNITY)
Admission: RE | Admit: 2021-08-22 | Discharge: 2021-08-22 | Disposition: A | Discharge status: Home / Self Care | Payer: Medicare Other | Source: Ambulatory Visit | Attending: Obstetrics and Gynecology | Admitting: Obstetrics and Gynecology

## 2021-08-22 ENCOUNTER — Ambulatory Visit: Payer: Medicare Other | Admitting: Obstetrics and Gynecology

## 2021-08-22 VITALS — BP 155/74 | Ht 62.0 in | Wt 263.0 lb

## 2021-08-22 DIAGNOSIS — L280 Lichen simplex chronicus: Secondary | ICD-10-CM | POA: Diagnosis not present

## 2021-08-22 DIAGNOSIS — N766 Ulceration of vulva: Secondary | ICD-10-CM | POA: Diagnosis present

## 2021-08-22 DIAGNOSIS — N904 Leukoplakia of vulva: Secondary | ICD-10-CM | POA: Diagnosis not present

## 2021-08-22 MED ORDER — CLOBETASOL PROPIONATE 0.05 % EX OINT: TOPICAL_OINTMENT | CUTANEOUS | 5 refills | Status: DC

## 2021-08-22 NOTE — Progress Notes (Signed)
Patient ID: Amber Avery, female   DOB: 1946-09-03, 75 y.o.   MRN: [id number]  Reason for Consult: Gynecologic Exam   Referred by Steele Sizer, MD  Subjective:     HPI:  Amber Avery is a 75 y.o. female she is here  today for consultation regarding a nonhealing vuvlar ulcer. It has been present for about 2.5 month. Nothing seems to improve the ulcer despite trying topical ointment.  Past Medical History:  Diagnosis Date   Asthma    Diabetes mellitus without complication ([place])    Gout    Hyperlipidemia    Hypertension    Iron deficiency anemia 04/20/2018   PONV (postoperative nausea and vomiting)    after hemorroid surgery   Shortness of breath dyspnea    Sleep apnea    Family History  Problem Relation Age of Onset   Cancer Mother        brain tumor   Kidney disease Mother    Hypertension Mother    Heart disease Father    COPD Father    Hypertension Sister    Arthritis Sister    Cancer Sister    Heart murmur Sister    GER disease Sister    Lupus Sister    Diabetes Sister    Arthritis Sister    Osteopenia Sister    Heart disease Sister    Hypertension Sister    Breast cancer Maternal Aunt 62   Leukemia Maternal Aunt    Past Surgical History:  Procedure Laterality Date   APPENDECTOMY     CATARACT EXTRACTION W/PHACO Left 08/14/2020   Procedure: CATARACT EXTRACTION PHACO AND INTRAOCULAR LENS PLACEMENT (IOC) LEFT DIABETIC 3.56 00:59.6 6.0%;  Surgeon: Leandrew Koyanagi, MD;  Location: [place];  Service: Ophthalmology;  Laterality: Left;  Diabetic   CATARACT EXTRACTION W/PHACO Right 09/04/2020   Procedure: CATARACT EXTRACTION PHACO AND INTRAOCULAR LENS PLACEMENT ([place]) RIGHT DIABETIC;  Surgeon: Leandrew Koyanagi, MD;  Location: [place];  Service: Ophthalmology;  Laterality: Right;  4.72 1:03.2 7.4%   CHOLECYSTECTOMY  1977   COLONOSCOPY WITH PROPOFOL N/A 01/21/2016   Procedure: COLONOSCOPY WITH PROPOFOL;  Surgeon: Lucilla Lame, MD;   Location: [facility];  Service: Endoscopy;  Laterality: N/A;   DILATION AND CURETTAGE OF UTERUS     Due to Amenorrhea   ESOPHAGOGASTRODUODENOSCOPY (EGD) WITH PROPOFOL N/A 08/16/2018   Procedure: ESOPHAGOGASTRODUODENOSCOPY (EGD) WITH PROPOFOL;  Surgeon: Lucilla Lame, MD;  Location: [facility];  Service: Endoscopy;  Laterality: N/A;   [place]     HERNIA REPAIR  2011   Temporal Area Excision Biopsy     For Birth Mark Changes, Negative Pathology   TONSILLECTOMY AND ADENOIDECTOMY      Short Social History:  Social History   Tobacco Use   Smoking status: Former    Packs/day: 2.00    Years: 15.00    Pack years: 30.00    Types: Cigarettes    Quit date: 1983    Years since quitting: 39.8   Smokeless tobacco: Never   Tobacco comments:    smoking cessation materials not required  Substance Use Topics   Alcohol use: Not Currently    Alcohol/week: 0.0 standard drinks    Allergies  Allergen Reactions   Codeine Hives and Nausea And Vomiting    Current Outpatient Medications  Medication Sig Dispense Refill   acetaminophen (TYLENOL) 500 MG tablet Take 1 tablet (500 mg total) by mouth every 6 (six) hours as needed. (  Patient taking differently: Take 1,000 mg by mouth every 8 (eight) hours.) 480 tablet 0   albuterol (ACCUNEB) 1.25 MG/3ML nebulizer solution Take 3 mLs (1.25 mg total) by nebulization 2 (two) times daily. 75 mL 2   albuterol (VENTOLIN HFA) 108 (90 Base) MCG/ACT inhaler Inhale 2 puffs into the lungs every 6 (six) hours as needed for wheezing or shortness of breath. 8 g 0   allopurinol (ZYLOPRIM) 300 MG tablet Take 1 tablet (300 mg total) by mouth daily. 90 tablet 3   atorvastatin (LIPITOR) 40 MG tablet Daily for cholesterol 90 tablet 3   benazepril (LOTENSIN) 5 MG tablet Take 1 tablet (5 mg total) by mouth daily. 90 tablet 3   diclofenac Sodium (VOLTAREN) 1 % GEL APPLY 4 GRAMS TOPICALLY 4 TIMES DAILY AS DIRECTED 300 g 3   Dulaglutide  (TRULICITY) 1.5 TJ/0.3ES SOPN Inject 1.5 mg into the skin once a week. 12 pen 3   furosemide (LASIX) 40 MG tablet Take 1 tablet (40 mg total) by mouth daily. 90 tablet 3   gabapentin (NEURONTIN) 300 MG capsule TAKE 1 CAPSULE BY MOUTH IN THE MORNING AND 1 IN THE EVENING AND 2 AT NIGHT 360 capsule 3   glucose blood (ONETOUCH VERIO) test strip USE AS DIRECTED 50 each 1   Lancets (ONETOUCH ULTRASOFT) lancets Use as instructed 100 each 12   potassium chloride SA (KLOR-CON M20) 20 MEQ tablet Take 1 tablet (20 mEq total) by mouth daily. 90 tablet 3   triamcinolone (KENALOG) 0.1 % Apply topically.     triamcinolone ointment (KENALOG) 0.1 % APPLY TOPICALLY TWICE DAILY AS NEEDED 90 g 0   vitamin B-12 (CYANOCOBALAMIN) 500 MCG tablet Take 500 mcg by mouth daily.     Vitamin D, Cholecalciferol, 25 MCG (1000 UT) TABS Take 1 capsule by mouth daily. 90 tablet 1   No current facility-administered medications for this visit.    Review of Systems  Constitutional: Negative for chills, fatigue, fever and unexpected weight change.  HENT: Negative for trouble swallowing.  Eyes: Negative for loss of vision.  Respiratory: Negative for cough, shortness of breath and wheezing.  Cardiovascular: Negative for chest pain, leg swelling, palpitations and syncope.  GI: Negative for abdominal pain, blood in stool, diarrhea, nausea and vomiting.  GU: Negative for difficulty urinating, dysuria, frequency and hematuria.  Musculoskeletal: Negative for back pain, leg pain and joint pain.  Skin: Negative for rash.  Neurological: Negative for dizziness, headaches, light-headedness, numbness and seizures.  Psychiatric: Negative for behavioral problem, confusion, depressed mood and sleep disturbance.       Objective:  Objective   Vitals:   08/22/21 1431  BP: (!) 155/74  Weight: 263 lb (119.3 kg)  Height: 5\' 2"  (1.575 m)   Body mass index is 48.1 kg/m.  Physical Exam Vitals and nursing note reviewed. Exam conducted with  a chaperone present.  Constitutional:      Appearance: Normal appearance. She is well-developed.  HENT:     Head: Normocephalic and atraumatic.  Eyes:     Extraocular Movements: Extraocular movements intact.     Pupils: Pupils are equal, round, and reactive to light.  Cardiovascular:     Rate and Rhythm: Normal rate and regular rhythm.  Pulmonary:     Effort: Pulmonary effort is normal. No respiratory distress.     Breath sounds: Normal breath sounds.  Abdominal:     General: Abdomen is flat.     Palpations: Abdomen is soft.  Genitourinary:   Musculoskeletal:  General: No signs of injury.  Skin:    General: Skin is warm and dry.  Neurological:     Mental Status: She is alert and oriented to person, place, and time.  Psychiatric:        Behavior: Behavior normal.        Thought Content: Thought content normal.        Judgment: Judgment normal.   VULVAR BIOPSY NOTE The indications for vulvar biopsy (rule out neoplasia, establish lichen sclerosus diagnosis) were reviewed.   Risks of the biopsy including pain, bleeding, infection, inadequate specimen, scarring and need for additional procedures  were discussed. The patient stated understanding and agreed to undergo procedure today. Consent was signed,  time out performed.   The patient's vulva was prepped with Betadine. 1% lidocaine was injected into area of concern. A 3 -mm punch biopsy was done, biopsy tissue was picked up with sterile forceps and sterile scissors were used to excise the lesion.  Small bleeding was noted and hemostasis was achieved using silver nitrate sticks.  The patient tolerated the procedure well. Post-procedure instructions  (pelvic rest for one week) were given to the patient. The patient is to call with heavy bleeding, fever greater than 100.4, foul smelling vaginal discharge or other concerns.    Assessment/Plan:     75 yo with nonhealing ulcer, vulvar biopsy today Suspect lichen, will start  clobetasol ointment.   More than 30 minutes were spent face to face with the patient in the room, reviewing the medical record, labs and images, and coordinating care for the patient. The plan of management was discussed in detail and counseling was provided.      Adrian Prows MD [facility] 08/22/2021 2:37 PM

## 2021-08-28 ENCOUNTER — Encounter: Payer: Self-pay | Admitting: Obstetrics and Gynecology

## 2021-08-28 ENCOUNTER — Other Ambulatory Visit: Payer: Self-pay

## 2021-08-28 ENCOUNTER — Ambulatory Visit: Payer: Medicare Other | Admitting: Obstetrics and Gynecology

## 2021-08-28 VITALS — BP 128/70 | Ht 62.0 in | Wt 263.0 lb

## 2021-08-28 DIAGNOSIS — L089 Local infection of the skin and subcutaneous tissue, unspecified: Secondary | ICD-10-CM | POA: Diagnosis not present

## 2021-08-28 DIAGNOSIS — T148XXA Other injury of unspecified body region, initial encounter: Secondary | ICD-10-CM

## 2021-08-28 MED ORDER — AMOXICILLIN-POT CLAVULANATE 875-125 MG PO TABS: 1.0000 | ORAL_TABLET | Freq: Two times a day (BID) | ORAL | 0 refills | Status: DC

## 2021-08-28 NOTE — Progress Notes (Signed)
Patient ID: Amber Avery, female   DOB: 12-Oct-1946, 75 y.o.   MRN: [id number]  Reason for Consult: Gynecologic Exam and Follow-up   Referred by Steele Sizer, MD  Subjective:     HPI:  Amber Avery is a 75 y.o. female. She id following up after a vulavr biopsy. Iopsy result is not yet available. She has been applying clobetasol and washing her vulva twice a day.   Gynecological History  No LMP recorded. Patient is postmenopausal.  Past Medical History:  Diagnosis Date   Asthma    Diabetes mellitus without complication ([place])    Gout    Hyperlipidemia    Hypertension    Iron deficiency anemia 04/20/2018   PONV (postoperative nausea and vomiting)    after hemorroid surgery   Shortness of breath dyspnea    Sleep apnea    Family History  Problem Relation Age of Onset   Cancer Mother        brain tumor   Kidney disease Mother    Hypertension Mother    Heart disease Father    COPD Father    Hypertension Sister    Arthritis Sister    Cancer Sister    Heart murmur Sister    GER disease Sister    Lupus Sister    Diabetes Sister    Arthritis Sister    Osteopenia Sister    Heart disease Sister    Hypertension Sister    Breast cancer Maternal Aunt 36   Leukemia Maternal Aunt    Past Surgical History:  Procedure Laterality Date   APPENDECTOMY     CATARACT EXTRACTION W/PHACO Left 08/14/2020   Procedure: CATARACT EXTRACTION PHACO AND INTRAOCULAR LENS PLACEMENT (IOC) LEFT DIABETIC 3.56 00:59.6 6.0%;  Surgeon: Leandrew Koyanagi, MD;  Location: [facility];  Service: Ophthalmology;  Laterality: Left;  Diabetic   CATARACT EXTRACTION W/PHACO Right 09/04/2020   Procedure: CATARACT EXTRACTION PHACO AND INTRAOCULAR LENS PLACEMENT ([place]) RIGHT DIABETIC;  Surgeon: Leandrew Koyanagi, MD;  Location: [facility];  Service: Ophthalmology;  Laterality: Right;  4.72 1:03.2 7.4%   CHOLECYSTECTOMY  1977   COLONOSCOPY WITH PROPOFOL N/A 01/21/2016   Procedure:  COLONOSCOPY WITH PROPOFOL;  Surgeon: Lucilla Lame, MD;  Location: [facility];  Service: Endoscopy;  Laterality: N/A;   DILATION AND CURETTAGE OF UTERUS     Due to Amenorrhea   ESOPHAGOGASTRODUODENOSCOPY (EGD) WITH PROPOFOL N/A 08/16/2018   Procedure: ESOPHAGOGASTRODUODENOSCOPY (EGD) WITH PROPOFOL;  Surgeon: Lucilla Lame, MD;  Location: [facility];  Service: Endoscopy;  Laterality: N/A;   [place]     HERNIA REPAIR  2011   Temporal Area Excision Biopsy     For Birth Mark Changes, Negative Pathology   TONSILLECTOMY AND ADENOIDECTOMY      Short Social History:  Social History   Tobacco Use   Smoking status: Former    Packs/day: 2.00    Years: 15.00    Pack years: 30.00    Types: Cigarettes    Quit date: 1983    Years since quitting: 39.8   Smokeless tobacco: Never   Tobacco comments:    smoking cessation materials not required  Substance Use Topics   Alcohol use: Not Currently    Alcohol/week: 0.0 standard drinks    Allergies  Allergen Reactions   Codeine Hives and Nausea And Vomiting    Current Outpatient Medications  Medication Sig Dispense Refill   acetaminophen (TYLENOL) 500 MG tablet Take 1 tablet (500 mg  total) by mouth every 6 (six) hours as needed. (Patient taking differently: Take 1,000 mg by mouth every 8 (eight) hours.) 480 tablet 0   albuterol (VENTOLIN HFA) 108 (90 Base) MCG/ACT inhaler Inhale 2 puffs into the lungs every 6 (six) hours as needed for wheezing or shortness of breath. 8 g 0   allopurinol (ZYLOPRIM) 300 MG tablet Take 1 tablet (300 mg total) by mouth daily. 90 tablet 3   amoxicillin-clavulanate (AUGMENTIN) 875-125 MG tablet Take 1 tablet by mouth 2 (two) times daily for 10 days. 20 tablet 0   atorvastatin (LIPITOR) 40 MG tablet Daily for cholesterol 90 tablet 3   benazepril (LOTENSIN) 5 MG tablet Take 1 tablet (5 mg total) by mouth daily. 90 tablet 3   clobetasol ointment (TEMOVATE) 0.05 % Apply to affected  area every night for 4 weeks, then every other day for 4 weeks and then twice a week for 4 weeks or until resolution. 30 g 5   diclofenac Sodium (VOLTAREN) 1 % GEL APPLY 4 GRAMS TOPICALLY 4 TIMES DAILY AS DIRECTED 300 g 3   Dulaglutide (TRULICITY) 1.5 LJ/4.4BE SOPN Inject 1.5 mg into the skin once a week. 12 pen 3   furosemide (LASIX) 40 MG tablet Take 1 tablet (40 mg total) by mouth daily. 90 tablet 3   gabapentin (NEURONTIN) 300 MG capsule TAKE 1 CAPSULE BY MOUTH IN THE MORNING AND 1 IN THE EVENING AND 2 AT NIGHT 360 capsule 3   glucose blood (ONETOUCH VERIO) test strip USE AS DIRECTED 50 each 1   Lancets (ONETOUCH ULTRASOFT) lancets Use as instructed 100 each 12   potassium chloride SA (KLOR-CON M20) 20 MEQ tablet Take 1 tablet (20 mEq total) by mouth daily. 90 tablet 3   triamcinolone (KENALOG) 0.1 % Apply topically.     triamcinolone ointment (KENALOG) 0.1 % APPLY TOPICALLY TWICE DAILY AS NEEDED 90 g 0   vitamin B-12 (CYANOCOBALAMIN) 500 MCG tablet Take 500 mcg by mouth daily.     Vitamin D, Cholecalciferol, 25 MCG (1000 UT) TABS Take 1 capsule by mouth daily. 90 tablet 1   albuterol (ACCUNEB) 1.25 MG/3ML nebulizer solution Take 3 mLs (1.25 mg total) by nebulization 2 (two) times daily. 75 mL 2   No current facility-administered medications for this visit.    REVIEW OF SYSTEMS      Objective:  Objective   Vitals:   08/28/21 1521  BP: 128/70  Weight: 263 lb (119.3 kg)  Height: 5\' 2"  (1.575 m)   Body mass index is 48.1 kg/m.  Physical Exam Genitourinary:    Comments: Vulvar biopsy base purulent and tender to the touch.   Assessment/Plan:    75 yo s/p vulvar biopsy of ulcerated lesion Vulvar biopsy with purulent base suggestive of infection  Will treat with Augment Continue with twice a day cleaning.  Follow up in 1 week. Notify us of any worsening symptoms  More than 10 minutes were spent face to face with the patient in the room, reviewing the medical record, labs and  images, and coordinating care for the patient. The plan of management was discussed in detail and counseling was provided.     Adrian Prows MD [facility], [facility] 08/28/2021 4:08 PM

## 2021-08-29 ENCOUNTER — Other Ambulatory Visit: Payer: Self-pay | Admitting: Obstetrics and Gynecology

## 2021-08-29 ENCOUNTER — Other Ambulatory Visit: Payer: Self-pay

## 2021-08-29 DIAGNOSIS — L089 Local infection of the skin and subcutaneous tissue, unspecified: Secondary | ICD-10-CM

## 2021-08-29 DIAGNOSIS — T148XXA Other injury of unspecified body region, initial encounter: Secondary | ICD-10-CM

## 2021-08-29 LAB — SURGICAL PATHOLOGY

## 2021-08-29 MED ORDER — AMOXICILLIN-POT CLAVULANATE 875-125 MG PO TABS: 1.0000 | ORAL_TABLET | Freq: Two times a day (BID) | ORAL | 0 refills | Status: AC

## 2021-08-29 MED ORDER — AMOXICILLIN-POT CLAVULANATE 875-125 MG PO TABS: 1.0000 | ORAL_TABLET | Freq: Two times a day (BID) | ORAL | 0 refills | Status: DC

## 2021-08-29 NOTE — Telephone Encounter (Signed)
Pt calling for rx to be sent to [facility] in [place] it has been sent to them.

## 2021-09-01 ENCOUNTER — Other Ambulatory Visit: Payer: Self-pay

## 2021-09-01 DIAGNOSIS — N182 Chronic kidney disease, stage 2 (mild): Secondary | ICD-10-CM | POA: Diagnosis not present

## 2021-09-01 DIAGNOSIS — M1990 Unspecified osteoarthritis, unspecified site: Secondary | ICD-10-CM | POA: Diagnosis not present

## 2021-09-01 DIAGNOSIS — I2510 Atherosclerotic heart disease of native coronary artery without angina pectoris: Secondary | ICD-10-CM | POA: Diagnosis not present

## 2021-09-01 DIAGNOSIS — R600 Localized edema: Secondary | ICD-10-CM | POA: Diagnosis not present

## 2021-09-01 DIAGNOSIS — L409 Psoriasis, unspecified: Secondary | ICD-10-CM

## 2021-09-01 MED ORDER — TRIAMCINOLONE ACETONIDE 0.1 % EX OINT: TOPICAL_OINTMENT | CUTANEOUS | 0 refills | Status: DC

## 2021-09-02 ENCOUNTER — Telehealth: Payer: Self-pay

## 2021-09-02 NOTE — Progress Notes (Signed)
    Chronic Care Management Pharmacy Assistant   Name: Amber Avery  MRN: [id number] DOB: 08-Sep-1946  Patient called to be reminded of her telephone appointment with Junius Argyle, CPP on 11/16 @ 1430  Patient aware of appointment date, time, and type of appointment (either telephone or in person). Patient aware to have/bring all medications, supplements, blood pressure and/or blood sugar logs to visit.  Questions: Are there any concerns you would like to discuss during your office visit? No  Are you having any problems obtaining your medications? (Whether it pharmacy issues or cost) No  Star Rating Drug: Atorvastatin 40 mg last filled on 05/06/2021 for a 90-Day supply with [facility] Benazepril 5 mg last filled on 08/26/2021 for a 90-Day supply with [facility]  Trulicity 1.5 mg patient receives medication through [employer]   Any gaps in medications fill history? Yes  Care Gaps: PNA Vaccine Diabetic Eye Exam   Lynann Bologna, CPA/CMA Clinical Pharmacist Assistant Phone: [phone]

## 2021-09-03 ENCOUNTER — Ambulatory Visit (INDEPENDENT_AMBULATORY_CARE_PROVIDER_SITE_OTHER): Payer: Medicare Other

## 2021-09-03 DIAGNOSIS — I152 Hypertension secondary to endocrine disorders: Secondary | ICD-10-CM

## 2021-09-03 DIAGNOSIS — N1831 Chronic kidney disease, stage 3a: Secondary | ICD-10-CM

## 2021-09-03 DIAGNOSIS — E1159 Type 2 diabetes mellitus with other circulatory complications: Secondary | ICD-10-CM

## 2021-09-03 DIAGNOSIS — E1122 Type 2 diabetes mellitus with diabetic chronic kidney disease: Secondary | ICD-10-CM

## 2021-09-03 DIAGNOSIS — N183 Chronic kidney disease, stage 3 unspecified: Secondary | ICD-10-CM

## 2021-09-03 NOTE — Progress Notes (Signed)
Chronic Care Management Pharmacy Note  09/03/2021 Name:  Amber Avery MRN:  [id number] DOB:  Jan 26, 1946  Summary: Patient presents for CCM follow-up. Patient with significant concerns regarding living situation, potentially having to give up her dog. She reports some grief over this, but reports she is handling her emotions well. PHQ-9 was 3.  Recommendations/Changes made from today's visit: Continue Current Medications Renew PAP for Trulicity  Plan: CPP Follow-up in 6 months   Subjective: Amber Avery is an 75 y.o. year old female who is a primary patient of Steele Sizer, MD.  The CCM team was consulted for assistance with disease management and care coordination needs.    Engaged with patient by telephone for follow up visit in response to provider referral for pharmacy case management and/or care coordination services.   Consent to Services:  The patient was given information about Chronic Care Management services, agreed to services, and gave verbal consent prior to initiation of services.  Please see initial visit note for detailed documentation.   Patient Care Team: Steele Sizer, MD as PCP - General (Family Medicine) Bryson Ha, OD as Consulting Physician (Optometry) Sharlet Salina, MD as Consulting Physician (Physical Medicine and Rehabilitation) Germaine Pomfret, [facility] as Pharmacist (Pharmacist)  Recent office visits: 08/15/21: Patient presented to Dr. Ancil Boozer for follow-up. A1c stable 6.3%.  12/13/20: Patient presented to Dr. Ancil Boozer for follow-up. Benazperil decreased to 5 mg daily.   Recent consult visits: None in previous 6 months  Hospital visits: None in previous 6 months  Objective:  Lab Results  Component Value Date   CREATININE 1.18 (H) 12/13/2020   BUN 27 (H) 12/13/2020   GFRNONAA 46 (L) 12/13/2020   GFRAA 53 (L) 12/13/2020   NA 142 12/13/2020   K 5.2 12/13/2020   CALCIUM 10.3 12/13/2020   CO2 28 12/13/2020   GLUCOSE 110 (H)  12/13/2020    Lab Results  Component Value Date/Time   HGBA1C 6.3 (A) 08/15/2021 12:13 PM   HGBA1C 6.0 (A) 04/14/2021 11:12 AM   HGBA1C 6.0 04/14/2019 10:33 AM   HGBA1C 6.3 12/08/2018 11:22 AM   MICROALBUR <0.2 12/13/2020 11:44 AM   MICROALBUR 0.3 12/14/2019 10:49 AM   MICROALBUR 20 04/11/2018 10:53 AM   MICROALBUR 20 08/09/2017 09:57 AM    Last diabetic Eye exam:  Lab Results  Component Value Date/Time   HMDIABEYEEXA No Retinopathy 06/11/2020 12:00 AM    Last diabetic Foot exam: No results found for: HMDIABFOOTEX   Lab Results  Component Value Date   CHOL 133 12/13/2020   HDL 52 12/13/2020   LDLCALC 60 12/13/2020   TRIG 130 12/13/2020   CHOLHDL 2.6 12/13/2020    Hepatic Function Latest Ref Rng & Units 12/13/2020 12/14/2019 12/08/2018  Total Protein 6.1 - 8.1 g/dL 7.1 6.6 6.7  Albumin 3.6 - 5.1 g/dL - - -  AST 10 - 35 U/L 34 22 27  ALT 6 - 29 U/L _0 Alk Phosphatase 33 - 130 U/L - - -  Total Bilirubin 0.2 - 1.2 mg/dL 0.7 0.7 0.6    Lab Results  Component Value Date/Time   TSH 4.400 12/09/2015 10:10 AM   TSH 4.380 09/18/2015 11:39 AM    CBC Latest Ref Rng & Units 12/13/2020 12/14/2019 12/08/2018  WBC 3.8 - 10.8 Thousand/uL 6.1 6.7 8.2  Hemoglobin 11.7 - 15.5 g/dL 13.0 12.3 11.7  Hematocrit 35.0 - 45.0 % 38.9 36.7 34.7(L)  Platelets 140 - 400 Thousand/uL 208 177 225  Lab Results  Component Value Date/Time   VD25OH 39 12/13/2020 11:44 AM   VD25OH 26 (L) 12/14/2019 10:49 AM    Clinical ASCVD: No  The 10-year ASCVD risk score (Arnett DK, et al., 2019) is: 32.1%   Values used to calculate the score:     Age: 8 years     Sex: Female     Is Non-Hispanic African American: No     Diabetic: Yes     Tobacco smoker: No     Systolic Blood Pressure: 836 mmHg     Is BP treated: Yes     HDL Cholesterol: 52 mg/dL     Total Cholesterol: 133 mg/dL    Depression screen [facility] 2/9 08/22/2021 08/15/2021 06/24/2021  Decreased Interest 0 0 0  Down, Depressed, Hopeless 0 0  0  PHQ - 2 Score 0 0 0  Altered sleeping 0 - -  Tired, decreased energy 1 - -  Change in appetite 0 - -  Feeling bad or failure about yourself  0 - -  Trouble concentrating 0 - -  Moving slowly or fidgety/restless 1 - -  Suicidal thoughts 0 - -  PHQ-9 Score 2 - -  Difficult doing work/chores Not difficult at all - -  Some recent data might be hidden    Social History   Tobacco Use  Smoking Status Former   Packs/day: 2.00   Years: 15.00   Pack years: 30.00   Types: Cigarettes   Quit date: 1983   Years since quitting: 39.9  Smokeless Tobacco Never  Tobacco Comments   smoking cessation materials not required   BP Readings from Last 3 Encounters:  08/28/21 128/70  08/22/21 (!) 155/74  08/15/21 134/72   Pulse Readings from Last 3 Encounters:  08/15/21 90  06/24/21 93  04/14/21 84   Wt Readings from Last 3 Encounters:  08/28/21 263 lb (119.3 kg)  08/22/21 263 lb (119.3 kg)  08/15/21 260 lb 12.8 oz (118.3 kg)   BMI Readings from Last 3 Encounters:  08/28/21 48.10 kg/m  08/22/21 48.10 kg/m  08/15/21 47.70 kg/m    Assessment/Interventions: Review of patient past medical history, allergies, medications, health status, including review of consultants reports, laboratory and other test data, was performed as part of comprehensive evaluation and provision of chronic care management services.   SDOH:  (Social Determinants of Health) assessments and interventions performed: Yes  SDOH Screenings   Alcohol Screen: Low Risk    Last Alcohol Screening Score (AUDIT): 0  Depression (PHQ2-9): Low Risk    PHQ-2 Score: 2  Financial Resource Strain: Low Risk    Difficulty of Paying Living Expenses: Not hard at all  Food Insecurity: No Food Insecurity   Worried About Charity fundraiser in the Last Year: Never true   Ran Out of Food in the Last Year: Never true  Housing: Low Risk    Last Housing Risk Score: 0  Physical Activity: Inactive   Days of Exercise per Week: 0 days    Minutes of Exercise per Session: 0 min  Social Connections: Socially Isolated   Frequency of Communication with Friends and Family: More than three times a week   Frequency of Social Gatherings with Friends and Family: More than three times a week   Attends Religious Services: Never   Marine scientist or Organizations: No   Attends Archivist Meetings: Never   Marital Status: Divorced  Stress: No Stress Concern Present   Feeling of Stress :  Only a little  Tobacco Use: Medium Risk   Smoking Tobacco Use: Former   Smokeless Tobacco Use: Never   Passive Exposure: Not on file  Transportation Needs: No Transportation Needs   Lack of Transportation (Medical): No   Lack of Transportation (Non-Medical): No    CCM Care Plan  Allergies  Allergen Reactions   Codeine Hives and Nausea And Vomiting    Medications Reviewed Today     Reviewed by Homero Fellers, MD (Physician) on 08/28/21 at 1608  Med List Status: <None>   Medication Order Taking? Sig Documenting Provider Last Dose Status Informant  acetaminophen (TYLENOL) 500 MG tablet [id number] Yes Take 1 tablet (500 mg total) by mouth every 6 (six) hours as needed.  Patient taking differently: Take 1,000 mg by mouth every 8 (eight) hours.   Steele Sizer, MD Taking Active   albuterol (ACCUNEB) 1.25 MG/3ML nebulizer solution [id number]  Take 3 mLs (1.25 mg total) by nebulization 2 (two) times daily. Steele Sizer, MD  Active            Med Note Kary Kos, Garnetta Buddy Oct 20, 2018  1:02 PM) Patient has not needed to use nebulizer  albuterol (VENTOLIN HFA) 108 (90 Base) MCG/ACT inhaler [id number] Yes Inhale 2 puffs into the lungs every 6 (six) hours as needed for wheezing or shortness of breath. Steele Sizer, MD Taking Active   allopurinol (ZYLOPRIM) 300 MG tablet [id number] Yes Take 1 tablet (300 mg total) by mouth daily. Steele Sizer, MD Taking Active   amoxicillin-clavulanate (AUGMENTIN) 875-125 MG tablet  [id number] Yes Take 1 tablet by mouth 2 (two) times daily for 10 days. Homero Fellers, MD  Active   atorvastatin (LIPITOR) 40 MG tablet [id number] Yes Daily for cholesterol Steele Sizer, MD Taking Active   benazepril (LOTENSIN) 5 MG tablet [id number] Yes Take 1 tablet (5 mg total) by mouth daily. Steele Sizer, MD Taking Active   clobetasol ointment (TEMOVATE) 0.05 % [id number] Yes Apply to affected area every night for 4 weeks, then every other day for 4 weeks and then twice a week for 4 weeks or until resolution. Homero Fellers, MD Taking Active   diclofenac Sodium (VOLTAREN) 1 % GEL [id number] Yes APPLY 4 GRAMS TOPICALLY 4 TIMES DAILY AS DIRECTED Sowles, Drue Stager, MD Taking Active   Dulaglutide (TRULICITY) 1.5 BH/4.1PF SOPN [id number] Yes Inject 1.5 mg into the skin once a week. Steele Sizer, MD Taking Active   furosemide (LASIX) 40 MG tablet [id number] Yes Take 1 tablet (40 mg total) by mouth daily. Steele Sizer, MD Taking Active   gabapentin (NEURONTIN) 300 MG capsule [id number] Yes TAKE 1 CAPSULE BY MOUTH IN THE MORNING AND 1 IN THE EVENING AND 2 AT Tonny Bollman, MD Taking Active   glucose blood [facility] VERIO) test strip [id number] Yes USE AS DIRECTED Steele Sizer, MD Taking Active   Lancets [facility] ULTRASOFT) lancets [id number] Yes Use as instructed Steele Sizer, MD Taking Active   potassium chloride SA (KLOR-CON M20) 20 MEQ tablet [id number] Yes Take 1 tablet (20 mEq total) by mouth daily. Steele Sizer, MD Taking Active   triamcinolone (KENALOG) 0.1 % [id number] Yes Apply topically. [provider] Taking Active   triamcinolone ointment (KENALOG) 0.1 % [id number] Yes APPLY TOPICALLY TWICE DAILY AS NEEDED Ancil Boozer, Drue Stager, MD Taking Active   vitamin B-12 (CYANOCOBALAMIN) 500 MCG tablet [id number] Yes Take 500 mcg by mouth daily. [provider] Taking Active   Vitamin D, Cholecalciferol, 25 MCG (1000 UT) TABS [id number]  Yes Take 1 capsule by mouth  daily. Steele Sizer, MD Taking Active             Patient Active Problem List   Diagnosis Date Noted   Gait instability 12/13/2020   Stage 3a chronic kidney disease ([place]) 12/13/2020   Cellulitis of right ankle 04/12/2020   Morbid obesity with BMI of 45.0-49.9, adult ([place]) 02/06/2020   History of gastritis 08/14/2019   Angiodysplasia of stomach and duodenum    Iron deficiency anemia 04/20/2018   Anemia, unspecified 44/62/8638   Lichen sclerosus of female genitalia 06/29/2016   Primary osteoarthritis of both knees 04/07/2016   Special screening for malignant neoplasms, colon    Right thyroid nodule 06/26/2015   Asthma, intermittent 04/03/2015   Benign essential HTN 04/03/2015   Carpal tunnel syndrome 04/03/2015   Chronic kidney disease (CKD), stage II (mild) 04/03/2015   Controlled gout 04/03/2015   Arteriosclerosis of coronary artery 04/03/2015   Diabetes mellitus with renal manifestations, controlled ([place]) 04/03/2015   Dyslipidemia 04/03/2015   Edema extremities 04/03/2015   Elevated sedimentation rate 04/03/2015   Knee pain 04/03/2015   Lumbar radiculitis 04/03/2015   Obstructive apnea 04/03/2015   Lumbosacral spondylosis 04/03/2015   Osteoarthritis, chronic 04/03/2015   Psoriasis 04/03/2015   Vitamin D deficiency 04/03/2015   Varicose veins 04/03/2015   Goiter 12/23/2014   Degeneration of intervertebral disc of lumbar region 08/23/2014   Corns and callosity 06/11/2009    Immunization History  Administered Date(s) Administered   Fluad Quad(high Dose 65+) 06/20/2020, 06/24/2021   Influenza, High Dose Seasonal PF 06/19/2015, 06/29/2016, 06/14/2017, 06/17/2018, 06/22/2019   Influenza-Unspecified 06/17/2011, 07/11/2019   PFIZER(Purple Top)SARS-COV-2 Vaccination 12/11/2019, 01/01/2020, 07/16/2020, 02/03/2021   Pneumococcal Conjugate-13 11/24/2011, 11/23/2013, 04/09/2017   Pneumococcal Polysaccharide-23 08/30/2009, 08/30/2009   Td 08/30/2009   Tdap 08/30/2009,  08/12/2020   Zoster Recombinat (Shingrix) 07/11/2019, 09/21/2019   Zoster, Live 02/12/2011    Conditions to be addressed/monitored:  Hypertension, Hyperlipidemia, Diabetes, Asthma, Chronic Kidney Disease, Osteoarthritis, Gout, and Chronic Pain  There are no care plans that you recently modified to display for this patient.    Medication Assistance:  Trulicity obtained through [employer] medication assistance program.  Enrollment ends Dec 2022  Compliance/Adherence/Medication fill history: Care Gaps: Covid Booster  Star-Rating Drugs: 03/18/2021 Atorvastatin 40 mg 90-Day supply with [facility] 03/10/2021 Benazepril 5 mg 90-Day supply with [facility]  Patient's preferred pharmacy is:  [facility] [address]), [place] - [place] [address] Phone: [phone] Fax: [phone]  [facility] ([place]) [place], [place] [address] Phone: [phone] Fax: [phone]  [id number], [place] - [place] [address] [place] [address] Phone: [phone] Fax: [phone]  Uses pill box? Yes Pt endorses 100% compliance  We discussed: Current pharmacy is preferred with insurance plan and patient is satisfied with pharmacy services Patient decided to: Continue current medication management strategy  Care Plan and Follow Up Patient Decision:  Patient agrees to Care Plan and Follow-up.  Plan: Telephone follow up appointment with care management team member scheduled for:  03/04/2022 at 3:00 PM  Malva Limes, [place] Pharmacist Practitioner  [facility] [phone]

## 2021-09-04 ENCOUNTER — Telehealth: Payer: Self-pay

## 2021-09-08 ENCOUNTER — Encounter: Payer: Self-pay | Admitting: Obstetrics and Gynecology

## 2021-09-08 ENCOUNTER — Ambulatory Visit: Payer: Medicare Other | Admitting: Obstetrics and Gynecology

## 2021-09-08 ENCOUNTER — Other Ambulatory Visit: Payer: Self-pay

## 2021-09-08 VITALS — BP 118/70 | Ht 62.0 in | Wt 261.8 lb

## 2021-09-08 DIAGNOSIS — N904 Leukoplakia of vulva: Secondary | ICD-10-CM | POA: Diagnosis not present

## 2021-09-08 NOTE — Progress Notes (Signed)
Patient ID: Amber Avery, female   DOB: 08/03/1946, 75 y.o.   MRN: [id number]  Reason for Consult: No chief complaint on file.   Referred by Steele Sizer, MD  Subjective:     HPI:  Amber Avery is a 75 y.o. female. She is following up for vulvar biopsy site monitoring. Reports it has been feeling better. She is washing her bottom tice a day and applying clobetasol ointment. She notes an improvement in her pain. She has been taking her oral antibioitc.   Gynecological History  No LMP recorded. Patient is postmenopausal.  Past Medical History:  Diagnosis Date   Asthma    Diabetes mellitus without complication ([place])    Gout    Hyperlipidemia    Hypertension    Iron deficiency anemia 04/20/2018   PONV (postoperative nausea and vomiting)    after hemorroid surgery   Shortness of breath dyspnea    Sleep apnea    Family History  Problem Relation Age of Onset   Cancer Mother        brain tumor   Kidney disease Mother    Hypertension Mother    Heart disease Father    COPD Father    Hypertension Sister    Arthritis Sister    Cancer Sister    Heart murmur Sister    GER disease Sister    Lupus Sister    Diabetes Sister    Arthritis Sister    Osteopenia Sister    Heart disease Sister    Hypertension Sister    Breast cancer Maternal Aunt 42   Leukemia Maternal Aunt    Past Surgical History:  Procedure Laterality Date   APPENDECTOMY     CATARACT EXTRACTION W/PHACO Left 08/14/2020   Procedure: CATARACT EXTRACTION PHACO AND INTRAOCULAR LENS PLACEMENT (IOC) LEFT DIABETIC 3.56 00:59.6 6.0%;  Surgeon: Leandrew Koyanagi, MD;  Location: [facility];  Service: Ophthalmology;  Laterality: Left;  Diabetic   CATARACT EXTRACTION W/PHACO Right 09/04/2020   Procedure: CATARACT EXTRACTION PHACO AND INTRAOCULAR LENS PLACEMENT ([place]) RIGHT DIABETIC;  Surgeon: Leandrew Koyanagi, MD;  Location: [facility];  Service: Ophthalmology;  Laterality: Right;   4.72 1:03.2 7.4%   CHOLECYSTECTOMY  1977   COLONOSCOPY WITH PROPOFOL N/A 01/21/2016   Procedure: COLONOSCOPY WITH PROPOFOL;  Surgeon: Lucilla Lame, MD;  Location: [facility];  Service: Endoscopy;  Laterality: N/A;   DILATION AND CURETTAGE OF UTERUS     Due to Amenorrhea   ESOPHAGOGASTRODUODENOSCOPY (EGD) WITH PROPOFOL N/A 08/16/2018   Procedure: ESOPHAGOGASTRODUODENOSCOPY (EGD) WITH PROPOFOL;  Surgeon: Lucilla Lame, MD;  Location: [facility];  Service: Endoscopy;  Laterality: N/A;   [place]     HERNIA REPAIR  2011   Temporal Area Excision Biopsy     For Birth Mark Changes, Negative Pathology   TONSILLECTOMY AND ADENOIDECTOMY      Short Social History:  Social History   Tobacco Use   Smoking status: Former    Packs/day: 2.00    Years: 15.00    Pack years: 30.00    Types: Cigarettes    Quit date: 1983    Years since quitting: 39.9   Smokeless tobacco: Never   Tobacco comments:    smoking cessation materials not required  Substance Use Topics   Alcohol use: Not Currently    Alcohol/week: 0.0 standard drinks    Allergies  Allergen Reactions   Codeine Hives and Nausea And Vomiting    Current Outpatient Medications  Medication Sig Dispense Refill   acetaminophen (TYLENOL) 500 MG tablet Take 1 tablet (500 mg total) by mouth every 6 (six) hours as needed. (Patient taking differently: Take 1,000 mg by mouth every 8 (eight) hours.) 480 tablet 0   albuterol (VENTOLIN HFA) 108 (90 Base) MCG/ACT inhaler Inhale 2 puffs into the lungs every 6 (six) hours as needed for wheezing or shortness of breath. 8 g 0   allopurinol (ZYLOPRIM) 300 MG tablet Take 1 tablet (300 mg total) by mouth daily. 90 tablet 3   amoxicillin-clavulanate (AUGMENTIN) 875-125 MG tablet Take 1 tablet by mouth 2 (two) times daily for 14 days. 28 tablet 0   atorvastatin (LIPITOR) 40 MG tablet Daily for cholesterol 90 tablet 3   benazepril (LOTENSIN) 5 MG tablet Take 1 tablet  (5 mg total) by mouth daily. 90 tablet 3   clobetasol ointment (TEMOVATE) 0.05 % Apply to affected area every night for 4 weeks, then every other day for 4 weeks and then twice a week for 4 weeks or until resolution. 30 g 5   diclofenac Sodium (VOLTAREN) 1 % GEL APPLY 4 GRAMS TOPICALLY 4 TIMES DAILY AS DIRECTED 300 g 3   Dulaglutide (TRULICITY) 1.5 ZO/1.0RU SOPN Inject 1.5 mg into the skin once a week. 12 pen 3   furosemide (LASIX) 40 MG tablet Take 1 tablet (40 mg total) by mouth daily. 90 tablet 3   gabapentin (NEURONTIN) 300 MG capsule TAKE 1 CAPSULE BY MOUTH IN THE MORNING AND 1 IN THE EVENING AND 2 AT NIGHT 360 capsule 3   glucose blood (ONETOUCH VERIO) test strip USE AS DIRECTED 50 each 1   Lancets (ONETOUCH ULTRASOFT) lancets Use as instructed 100 each 12   potassium chloride SA (KLOR-CON M20) 20 MEQ tablet Take 1 tablet (20 mEq total) by mouth daily. 90 tablet 3   triamcinolone (KENALOG) 0.1 % Apply topically.     triamcinolone ointment (KENALOG) 0.1 % APPLY TOPICALLY TWICE DAILY AS NEEDED 90 g 0   vitamin B-12 (CYANOCOBALAMIN) 500 MCG tablet Take 500 mcg by mouth daily.     Vitamin D, Cholecalciferol, 25 MCG (1000 UT) TABS Take 1 capsule by mouth daily. 90 tablet 1   No current facility-administered medications for this visit.    REVIEW OF SYSTEMS      Objective:  Objective   Vitals:   09/08/21 1621  BP: 118/70  Weight: 261 lb 12.8 oz (118.8 kg)  Height: 5\' 2"  (1.575 m)   Body mass index is 47.88 kg/m.  Physical Exam Vitals and nursing note reviewed. Exam conducted with a chaperone present.  Constitutional:      Appearance: Normal appearance. She is well-developed.  HENT:     Head: Normocephalic and atraumatic.  Eyes:     Extraocular Movements: Extraocular movements intact.     Pupils: Pupils are equal, round, and reactive to light.  Cardiovascular:     Rate and Rhythm: Normal rate and regular rhythm.  Pulmonary:     Effort: Pulmonary effort is normal. No  respiratory distress.     Breath sounds: Normal breath sounds.  Abdominal:     General: Abdomen is flat.     Palpations: Abdomen is soft.  Genitourinary:   Musculoskeletal:        General: No signs of injury.  Skin:    General: Skin is warm and dry.  Neurological:     Mental Status: She is alert and oriented to person, place, and time.  Psychiatric:  Behavior: Behavior normal.        Thought Content: Thought content normal.        Judgment: Judgment normal.    Assessment/Plan:     75 yo s/p vulvar biopsy with lichen sclerosis Continue current cleaning and steroid application regimen. Discussed follow up in 2 weeks, but her preference is 3 weeks. She feels like she has had improvement overall.  More than 5 minutes were spent face to face with the patient in the room, reviewing the medical record, labs and images, and coordinating care for the patient. The plan of management was discussed in detail and counseling was provided.     Adrian Prows MD [facility], [facility] 09/08/2021 5:08 PM

## 2021-09-16 DIAGNOSIS — J449 Chronic obstructive pulmonary disease, unspecified: Secondary | ICD-10-CM | POA: Diagnosis not present

## 2021-09-16 DIAGNOSIS — M48062 Spinal stenosis, lumbar region with neurogenic claudication: Secondary | ICD-10-CM | POA: Diagnosis not present

## 2021-09-16 DIAGNOSIS — M5416 Radiculopathy, lumbar region: Secondary | ICD-10-CM | POA: Diagnosis not present

## 2021-09-17 ENCOUNTER — Telehealth: Payer: Self-pay | Admitting: Obstetrics and Gynecology

## 2021-09-17 DIAGNOSIS — E1159 Type 2 diabetes mellitus with other circulatory complications: Secondary | ICD-10-CM | POA: Diagnosis not present

## 2021-09-17 DIAGNOSIS — I152 Hypertension secondary to endocrine disorders: Secondary | ICD-10-CM

## 2021-09-17 DIAGNOSIS — Z87891 Personal history of nicotine dependence: Secondary | ICD-10-CM

## 2021-09-17 DIAGNOSIS — E1122 Type 2 diabetes mellitus with diabetic chronic kidney disease: Secondary | ICD-10-CM | POA: Diagnosis not present

## 2021-09-17 DIAGNOSIS — Z7985 Long-term (current) use of injectable non-insulin antidiabetic drugs: Secondary | ICD-10-CM | POA: Diagnosis not present

## 2021-09-17 DIAGNOSIS — N1831 Chronic kidney disease, stage 3a: Secondary | ICD-10-CM

## 2021-09-17 NOTE — Patient Instructions (Signed)
Visit Information It was great speaking with you today!  Please let me know if you have any questions about our visit.   Goals Addressed             This Visit's Progress    Monitor and Manage My Blood Sugar-Diabetes Type 2       Timeframe:  Long-Range Goal Priority:  High Start Date: 04/04/2021                            Expected End Date: 10/04/2022                      Follow Up within 90 days    - check blood sugar at prescribed times - check blood sugar if I feel it is too high or too low - enter blood sugar readings and medication or insulin into daily log    Why is this important?   Checking your blood sugar at home helps to keep it from getting very high or very low.  Writing the results in a diary or log helps the doctor know how to care for you.  Your blood sugar log should have the time, date and the results.  Also, write down the amount of insulin or other medicine that you take.  Other information, like what you ate, exercise done and how you were feeling, will also be helpful.     Notes:         Patient Care Plan: General Pharmacy (Adult)     Problem Identified: Hypertension, Hyperlipidemia, Diabetes, Asthma, Chronic Kidney Disease, Osteoarthritis, Gout, and Chronic Pain   Priority: High     Long-Range Goal: Patient-Specific Goal   Start Date: 04/03/2021  Expected End Date: 09/17/2022  This Visit's Progress: On track  Recent Progress: On track  Priority: High  Note:   Current Barriers:  Unable to independently afford treatment regimen  Pharmacist Clinical Goal(s):  Patient will verbalize ability to afford treatment regimen maintain control of diabetes as evidenced by A1c less than 8%  through collaboration with PharmD and provider.   Interventions: 1:1 collaboration with Steele Sizer, MD regarding development and update of comprehensive plan of care as evidenced by provider attestation and co-signature Inter-disciplinary care team collaboration  (see longitudinal plan of care) Comprehensive medication review performed; medication list updated in electronic medical record  Hypertension (BP goal <140/90) -Controlled -Current treatment: Furosemide 40 mg daily  Benazepril 5 mg daily  -Medications previously tried: NA  -Current home readings: 114/71, 131/72, 108/77, 111/69, 110/68  -Denies hypotensive/hypertensive symptoms -Counseled to monitor BP at home weekly, document, and provide log at future appointments -Recommended to continue current medication  Hyperlipidemia: (LDL goal < 100) -Controlled -Current treatment: Atorvastatin 40 mg daily  -Medications previously tried: NA  -Recommended to continue current medication  Diabetes (A1c goal <8%) -Controlled -Current medications: Trulicity 1.5 mg weekly  -Medications previously tried: NA  -Current home glucose readings fasting glucose: Has not been monitoring at home. post prandial glucose: NA -Denies hypoglycemic/hyperglycemic symptoms -Recommended to continue current medication  Asthma (Goal: control symptoms and prevent exacerbations) -Controlled -Current treatment  Ventolin HFA  Albuterol Nebulizer twice daily  Budesonide nebulizer twice daily  -Medications previously tried: NA  -Exacerbations requiring treatment in last 6 months: NA -Patient reports consistent use of maintenance inhaler -Frequency of rescue inhaler use: Sporadic, varies based on activity -Counseled on Benefits of consistent maintenance inhaler use -Recommended to continue current  medication  Chronic Pain (Goal: Maintain adequate pain control) -Not ideally controlled -Current treatment  Diclofenac 1 % gel three times daily  Gabapentin 500 mg 1 tablet AM, 1 PM, 2 HS  Acetaminophen 500 mg every 6 hours as needed  -Medications previously tried: NA -Patient with worsening hip pain recently. Tries to limit gabapentin when possible.  -Counseled to limit APAP to 3000 mg daily   -Recommended to  continue current medication  Chronic Kidney Disease Stage 3a  -All medications assessed for renal dosing and appropriateness in chronic kidney disease. -Recommended to continue current medication  Patient Goals/Self-Care Activities Patient will:  - check glucose daily, document, and provide at future appointments  Follow Up Plan: Telephone follow up appointment with care management team member scheduled for:  03/04/2022 at 3:00 PM    Patient agreed to services and verbal consent obtained.   Patient verbalizes understanding of instructions provided today and agrees to view in [place].   Malva Limes, [place] Pharmacist Practitioner  [facility] [phone]

## 2021-09-17 NOTE — Telephone Encounter (Signed)
This pt has called in needing a refill for a antibiotic.  She  brought the bottle in to [place] on Tuesday, Nov. 29th.

## 2021-09-18 MED ORDER — AMOXICILLIN-POT CLAVULANATE 875-125 MG PO TABS: 1.0000 | ORAL_TABLET | Freq: Two times a day (BID) | ORAL | 0 refills | Status: AC

## 2021-09-18 NOTE — Telephone Encounter (Signed)
Spoke to patient : She was told med Amox-Clav has been called in and that's she need to have an appt for a f/u pertaining to the need for this medication.Patient state she has made the appt already.

## 2021-10-01 DIAGNOSIS — H524 Presbyopia: Secondary | ICD-10-CM | POA: Diagnosis not present

## 2021-10-01 DIAGNOSIS — E119 Type 2 diabetes mellitus without complications: Secondary | ICD-10-CM | POA: Diagnosis not present

## 2021-10-01 DIAGNOSIS — H1713 Central corneal opacity, bilateral: Secondary | ICD-10-CM | POA: Diagnosis not present

## 2021-10-01 DIAGNOSIS — Z0100 Encounter for examination of eyes and vision without abnormal findings: Secondary | ICD-10-CM | POA: Diagnosis not present

## 2021-10-01 LAB — HM DIABETES EYE EXAM

## 2021-10-02 ENCOUNTER — Encounter: Payer: Self-pay | Admitting: Obstetrics and Gynecology

## 2021-10-02 ENCOUNTER — Ambulatory Visit: Payer: Medicare Other | Admitting: Obstetrics and Gynecology

## 2021-10-02 ENCOUNTER — Other Ambulatory Visit: Payer: Self-pay

## 2021-10-02 VITALS — BP 128/70 | Ht 62.0 in | Wt 261.7 lb

## 2021-10-02 DIAGNOSIS — N904 Leukoplakia of vulva: Secondary | ICD-10-CM

## 2021-10-02 NOTE — Progress Notes (Signed)
Patient ID: Amber Avery, female   DOB: Feb 21, 1946, 75 y.o.   MRN: [id number]  Reason for Consult: Follow-up   Referred by Steele Sizer, MD  Subjective:     HPI:  Amber Avery is a 75 y.o. female she is following up for healing of a left labial ulcer. Biopsy previously did not indicate cancerous lesion. Lichen sclerosus chronicus.   Gynecological History  No LMP recorded. Patient is postmenopausal.  Past Medical History:  Diagnosis Date   Asthma    Diabetes mellitus without complication ([place])    Gout    Hyperlipidemia    Hypertension    Iron deficiency anemia 04/20/2018   PONV (postoperative nausea and vomiting)    after hemorroid surgery   Shortness of breath dyspnea    Sleep apnea    Family History  Problem Relation Age of Onset   Cancer Mother        brain tumor   Kidney disease Mother    Hypertension Mother    Heart disease Father    COPD Father    Hypertension Sister    Arthritis Sister    Cancer Sister    Heart murmur Sister    GER disease Sister    Lupus Sister    Diabetes Sister    Arthritis Sister    Osteopenia Sister    Heart disease Sister    Hypertension Sister    Breast cancer Maternal Aunt 69   Leukemia Maternal Aunt    Past Surgical History:  Procedure Laterality Date   APPENDECTOMY     CATARACT EXTRACTION W/PHACO Left 08/14/2020   Procedure: CATARACT EXTRACTION PHACO AND INTRAOCULAR LENS PLACEMENT (IOC) LEFT DIABETIC 3.56 00:59.6 6.0%;  Surgeon: Leandrew Koyanagi, MD;  Location: [place];  Service: Ophthalmology;  Laterality: Left;  Diabetic   CATARACT EXTRACTION W/PHACO Right 09/04/2020   Procedure: CATARACT EXTRACTION PHACO AND INTRAOCULAR LENS PLACEMENT ([place]) RIGHT DIABETIC;  Surgeon: Leandrew Koyanagi, MD;  Location: [place];  Service: Ophthalmology;  Laterality: Right;  4.72 1:03.2 7.4%   CHOLECYSTECTOMY  1977   COLONOSCOPY WITH PROPOFOL N/A 01/21/2016   Procedure: COLONOSCOPY WITH PROPOFOL;   Surgeon: Lucilla Lame, MD;  Location: [facility];  Service: Endoscopy;  Laterality: N/A;   DILATION AND CURETTAGE OF UTERUS     Due to Amenorrhea   ESOPHAGOGASTRODUODENOSCOPY (EGD) WITH PROPOFOL N/A 08/16/2018   Procedure: ESOPHAGOGASTRODUODENOSCOPY (EGD) WITH PROPOFOL;  Surgeon: Lucilla Lame, MD;  Location: [facility];  Service: Endoscopy;  Laterality: N/A;   [place]     HERNIA REPAIR  2011   Temporal Area Excision Biopsy     For Birth Mark Changes, Negative Pathology   TONSILLECTOMY AND ADENOIDECTOMY      Short Social History:  Social History   Tobacco Use   Smoking status: Former    Packs/day: 2.00    Years: 15.00    Pack years: 30.00    Types: Cigarettes    Quit date: 1983    Years since quitting: 40.0   Smokeless tobacco: Never   Tobacco comments:    smoking cessation materials not required  Substance Use Topics   Alcohol use: Not Currently    Alcohol/week: 0.0 standard drinks    Allergies  Allergen Reactions   Codeine Hives and Nausea And Vomiting    Current Outpatient Medications  Medication Sig Dispense Refill   acetaminophen (TYLENOL) 500 MG tablet Take 1 tablet (500 mg total) by mouth every 6 (six) hours  as needed. (Patient taking differently: Take 1,000 mg by mouth every 8 (eight) hours.) 480 tablet 0   albuterol (VENTOLIN HFA) 108 (90 Base) MCG/ACT inhaler Inhale 2 puffs into the lungs every 6 (six) hours as needed for wheezing or shortness of breath. 8 g 0   allopurinol (ZYLOPRIM) 300 MG tablet Take 1 tablet (300 mg total) by mouth daily. 90 tablet 3   atorvastatin (LIPITOR) 40 MG tablet Daily for cholesterol 90 tablet 3   benazepril (LOTENSIN) 5 MG tablet Take 1 tablet (5 mg total) by mouth daily. 90 tablet 3   clobetasol ointment (TEMOVATE) 0.05 % Apply to affected area every night for 4 weeks, then every other day for 4 weeks and then twice a week for 4 weeks or until resolution. 30 g 5   diclofenac Sodium  (VOLTAREN) 1 % GEL APPLY 4 GRAMS TOPICALLY 4 TIMES DAILY AS DIRECTED 300 g 3   Dulaglutide (TRULICITY) 1.5 TI/4.5YK SOPN Inject 1.5 mg into the skin once a week. 12 pen 3   furosemide (LASIX) 40 MG tablet Take 1 tablet (40 mg total) by mouth daily. 90 tablet 3   gabapentin (NEURONTIN) 300 MG capsule TAKE 1 CAPSULE BY MOUTH IN THE MORNING AND 1 IN THE EVENING AND 2 AT NIGHT 360 capsule 3   glucose blood (ONETOUCH VERIO) test strip USE AS DIRECTED 50 each 1   Lancets (ONETOUCH ULTRASOFT) lancets Use as instructed 100 each 12   potassium chloride SA (KLOR-CON M20) 20 MEQ tablet Take 1 tablet (20 mEq total) by mouth daily. 90 tablet 3   triamcinolone (KENALOG) 0.1 % Apply topically.     triamcinolone ointment (KENALOG) 0.1 % APPLY TOPICALLY TWICE DAILY AS NEEDED 90 g 0   vitamin B-12 (CYANOCOBALAMIN) 500 MCG tablet Take 500 mcg by mouth daily.     Vitamin D, Cholecalciferol, 25 MCG (1000 UT) TABS Take 1 capsule by mouth daily. 90 tablet 1   No current facility-administered medications for this visit.    Review of Systems  Constitutional: Negative for chills, fatigue, fever and unexpected weight change.  HENT: Negative for trouble swallowing.  Eyes: Negative for loss of vision.  Respiratory: Negative for cough, shortness of breath and wheezing.  Cardiovascular: Negative for chest pain, leg swelling, palpitations and syncope.  GI: Negative for abdominal pain, blood in stool, diarrhea, nausea and vomiting.  GU: Negative for difficulty urinating, dysuria, frequency and hematuria.  Musculoskeletal: Negative for back pain, leg pain and joint pain.  Skin: Negative for rash.  Neurological: Negative for dizziness, headaches, light-headedness, numbness and seizures.  Psychiatric: Negative for behavioral problem, confusion, depressed mood and sleep disturbance.       Objective:  Objective   Vitals:   10/02/21 1509  BP: 128/70  Weight: 261 lb 11.2 oz (118.7 kg)  Height: 5\' 2"  (1.575 m)   Body  mass index is 47.87 kg/m.  Physical Exam Vitals and nursing note reviewed. Exam conducted with a chaperone present.  Constitutional:      Appearance: Normal appearance. She is well-developed.  HENT:     Head: Normocephalic and atraumatic.  Eyes:     Extraocular Movements: Extraocular movements intact.     Pupils: Pupils are equal, round, and reactive to light.  Cardiovascular:     Rate and Rhythm: Normal rate and regular rhythm.  Pulmonary:     Effort: Pulmonary effort is normal. No respiratory distress.     Breath sounds: Normal breath sounds.  Abdominal:     General: Abdomen  is flat.     Palpations: Abdomen is soft.  Genitourinary:    Comments: External: vulvar ulcer appears to be healing Musculoskeletal:        General: No signs of injury.  Skin:    General: Skin is warm and dry.  Neurological:     Mental Status: She is alert and oriented to person, place, and time.  Psychiatric:        Behavior: Behavior normal.        Thought Content: Thought content normal.        Judgment: Judgment normal.    Assessment/Plan:     Healing vulvar ulcer- continue perineal care and daily clobetasol in PM, taking second round of antibiotic. Neosporin in AM  Follow up next week.  More than 10 minutes were spent face to face with the patient in the room, reviewing the medical record, labs and images, and coordinating care for the patient. The plan of management was discussed in detail and counseling was provided.    Adrian Prows MD, [facility], [facility] 11/11/2021 10:29 AM

## 2021-10-03 ENCOUNTER — Ambulatory Visit: Payer: Self-pay

## 2021-10-03 NOTE — Telephone Encounter (Signed)
°  Chief Complaint: Left shin injury Symptoms: knot on L shin that size of small egg, swelling, redness, warmth Frequency: over a month ago but knot just came up Saturday Pertinent Negatives: Patient denies fever or other symptoms Disposition: [] ED /[] Urgent Care (no appt availability in office) / [x] Appointment(In office/virtual)/ []  [facility]/ [] Home Care/ [] Refused Recommended Disposition  Additional Notes: called and spoke with Lenna Sciara, FC who was able to schedule appt for 10/07/21 at 1020 with Dr. Ancil Boozer. Advised pt if area gets worse before then can go to UC to be seen. Pt states she will wait for that appt. Also encouraged to use heat/ice therapy until appt. Pt verbalized understanding.     Summary: left shin discomfort   The patient has concerns with a knot on their left shin   The patient shares that they feel heat radiating from it   The patient shares that they hit their leg on their car door roughly a month ago   Please contact     Reason for Disposition  [1] After 2 weeks AND [2] still painful or swollen  Answer Assessment - Initial Assessment Questions 1. MECHANISM: "How did the injury happen?" (e.g., twisting injury, direct blow)      Hit L shin on car door  2. ONSET: "When did the injury happen?" (Minutes or hours ago)      A month ago 3. LOCATION: "Where is the injury located?"      L shin 4. APPEARANCE of INJURY: "What does the injury look like?"  (e.g., deformity of leg)     Knot on shin size of small egg, pinkish red 5. SEVERITY: "Can you put weight on that leg?" "Can you walk?"      yes 6. SIZE: For cuts, bruises, or swelling, ask: "How large is it?" (e.g., inches or centimeters)      Size of small egg 7. PAIN: "Is there pain?" If Yes, ask: "How bad is the pain?"   "What does it keep you from doing?" (e.g., Scale 1-10; or mild, moderate, severe)   -  NONE: (0): no pain   -  MILD (1-3): doesn't interfere with normal activities    -  MODERATE  (4-7): interferes with normal activities (e.g., work or school) or awakens from sleep, limping    -  SEVERE (8-10): excruciating pain, unable to do any normal activities, unable to walk     discomfort 9. OTHER SYMPTOMS: "Do you have any other symptoms?"      No  Protocols used: Leg Injury-A-AH

## 2021-10-06 NOTE — Progress Notes (Signed)
Name: Amber Avery   MRN: [id number]    DOB: September 29, 1946   Date:10/07/2021       Progress Note  Subjective  Chief Complaint  Knot on Leg  HPI  Know on left lower leg: she states she bumped her leg a few weeks ago while at home, no pain or bruising initially but was applying lotion a couple of days ago and noticed a knot on the area. Slightly tender to touch. A little red. No fever or chills.   Patient Active Problem List   Diagnosis Date Noted   Gait instability 12/13/2020   Stage 3a chronic kidney disease ([place]) 12/13/2020   Cellulitis of right ankle 04/12/2020   Morbid obesity with BMI of 45.0-49.9, adult ([place]) 02/06/2020   History of gastritis 08/14/2019   Angiodysplasia of stomach and duodenum    Iron deficiency anemia 04/20/2018   Anemia, unspecified 66/44/0347   Lichen sclerosus of female genitalia 06/29/2016   Primary osteoarthritis of both knees 04/07/2016   Special screening for malignant neoplasms, colon    Right thyroid nodule 06/26/2015   Asthma, intermittent 04/03/2015   Benign essential HTN 04/03/2015   Carpal tunnel syndrome 04/03/2015   Chronic kidney disease (CKD), stage II (mild) 04/03/2015   Controlled gout 04/03/2015   Arteriosclerosis of coronary artery 04/03/2015   Diabetes mellitus with renal manifestations, controlled ([place]) 04/03/2015   Dyslipidemia 04/03/2015   Edema extremities 04/03/2015   Elevated sedimentation rate 04/03/2015   Knee pain 04/03/2015   Lumbar radiculitis 04/03/2015   Obstructive apnea 04/03/2015   Lumbosacral spondylosis 04/03/2015   Osteoarthritis, chronic 04/03/2015   Psoriasis 04/03/2015   Vitamin D deficiency 04/03/2015   Varicose veins 04/03/2015   Goiter 12/23/2014   Degeneration of intervertebral disc of lumbar region 08/23/2014   Corns and callosity 06/11/2009    Past Surgical History:  Procedure Laterality Date   APPENDECTOMY     CATARACT EXTRACTION W/PHACO Left 08/14/2020   Procedure: CATARACT EXTRACTION PHACO  AND INTRAOCULAR LENS PLACEMENT (IOC) LEFT DIABETIC 3.56 00:59.6 6.0%;  Surgeon: Leandrew Koyanagi, MD;  Location: [facility];  Service: Ophthalmology;  Laterality: Left;  Diabetic   CATARACT EXTRACTION W/PHACO Right 09/04/2020   Procedure: CATARACT EXTRACTION PHACO AND INTRAOCULAR LENS PLACEMENT ([place]) RIGHT DIABETIC;  Surgeon: Leandrew Koyanagi, MD;  Location: [facility];  Service: Ophthalmology;  Laterality: Right;  4.72 1:03.2 7.4%   CHOLECYSTECTOMY  1977   COLONOSCOPY WITH PROPOFOL N/A 01/21/2016   Procedure: COLONOSCOPY WITH PROPOFOL;  Surgeon: Lucilla Lame, MD;  Location: [facility];  Service: Endoscopy;  Laterality: N/A;   DILATION AND CURETTAGE OF UTERUS     Due to Amenorrhea   ESOPHAGOGASTRODUODENOSCOPY (EGD) WITH PROPOFOL N/A 08/16/2018   Procedure: ESOPHAGOGASTRODUODENOSCOPY (EGD) WITH PROPOFOL;  Surgeon: Lucilla Lame, MD;  Location: [facility];  Service: Endoscopy;  Laterality: N/A;   [place]     HERNIA REPAIR  2011   Temporal Area Excision Biopsy     For Birth Mark Changes, Negative Pathology   TONSILLECTOMY AND ADENOIDECTOMY      Family History  Problem Relation Age of Onset   Cancer Mother        brain tumor   Kidney disease Mother    Hypertension Mother    Heart disease Father    COPD Father    Hypertension Sister    Arthritis Sister    Cancer Sister    Heart murmur Sister    GER disease Sister    Lupus Sister  Diabetes Sister    Arthritis Sister    Osteopenia Sister    Heart disease Sister    Hypertension Sister    Breast cancer Maternal Aunt 70   Leukemia Maternal Aunt     Social History   Tobacco Use   Smoking status: Former    Packs/day: 2.00    Years: 15.00    Pack years: 30.00    Types: Cigarettes    Quit date: 1983    Years since quitting: 39.9   Smokeless tobacco: Never   Tobacco comments:    smoking cessation materials not required  Substance Use Topics   Alcohol use: Not  Currently    Alcohol/week: 0.0 standard drinks     Current Outpatient Medications:    acetaminophen (TYLENOL) 500 MG tablet, Take 1 tablet (500 mg total) by mouth every 6 (six) hours as needed. (Patient taking differently: Take 1,000 mg by mouth every 8 (eight) hours.), Disp: 480 tablet, Rfl: 0   albuterol (VENTOLIN HFA) 108 (90 Base) MCG/ACT inhaler, Inhale 2 puffs into the lungs every 6 (six) hours as needed for wheezing or shortness of breath., Disp: 8 g, Rfl: 0   allopurinol (ZYLOPRIM) 300 MG tablet, Take 1 tablet (300 mg total) by mouth daily., Disp: 90 tablet, Rfl: 3   atorvastatin (LIPITOR) 40 MG tablet, Daily for cholesterol, Disp: 90 tablet, Rfl: 3   benazepril (LOTENSIN) 5 MG tablet, Take 1 tablet (5 mg total) by mouth daily., Disp: 90 tablet, Rfl: 3   clobetasol ointment (TEMOVATE) 0.05 %, Apply to affected area every night for 4 weeks, then every other day for 4 weeks and then twice a week for 4 weeks or until resolution., Disp: 30 g, Rfl: 5   diclofenac Sodium (VOLTAREN) 1 % GEL, APPLY 4 GRAMS TOPICALLY 4 TIMES DAILY AS DIRECTED, Disp: 300 g, Rfl: 3   Dulaglutide (TRULICITY) 1.5 PP/2.9JJ SOPN, Inject 1.5 mg into the skin once a week., Disp: 12 pen, Rfl: 3   furosemide (LASIX) 40 MG tablet, Take 1 tablet (40 mg total) by mouth daily., Disp: 90 tablet, Rfl: 3   gabapentin (NEURONTIN) 300 MG capsule, TAKE 1 CAPSULE BY MOUTH IN THE MORNING AND 1 IN THE EVENING AND 2 AT NIGHT, Disp: 360 capsule, Rfl: 3   glucose blood (ONETOUCH VERIO) test strip, USE AS DIRECTED, Disp: 50 each, Rfl: 1   Lancets (ONETOUCH ULTRASOFT) lancets, Use as instructed, Disp: 100 each, Rfl: 12   potassium chloride SA (KLOR-CON M20) 20 MEQ tablet, Take 1 tablet (20 mEq total) by mouth daily., Disp: 90 tablet, Rfl: 3   triamcinolone (KENALOG) 0.1 %, Apply topically., Disp: , Rfl:    triamcinolone ointment (KENALOG) 0.1 %, APPLY TOPICALLY TWICE DAILY AS NEEDED, Disp: 90 g, Rfl: 0   vitamin B-12 (CYANOCOBALAMIN) 500  MCG tablet, Take 500 mcg by mouth daily., Disp: , Rfl:    Vitamin D, Cholecalciferol, 25 MCG (1000 UT) TABS, Take 1 capsule by mouth daily., Disp: 90 tablet, Rfl: 1  Allergies  Allergen Reactions   Codeine Hives and Nausea And Vomiting    I personally reviewed active problem list, medication list, allergies, family history, social history, health maintenance with the patient/caregiver today.   ROS  Ten systems reviewed and is negative except as mentioned in HPI   Objective  Vitals:   10/07/21 1011  BP: 126/66  Pulse: 84  Resp: 16  Temp: 97.6 F (36.4 C)  TempSrc: Oral  SpO2: 98%  Weight: 259 lb 14.4 oz (117.9 kg)  Height: 5\' 2"  (1.575 m)    Body mass index is 47.54 kg/m.  Physical Exam  Constitutional: Patient appears well-developed and well-nourished. Obese  No distress.  HEENT: head atraumatic, normocephalic, pupils equal and reactive to light, neck supple Cardiovascular: Normal rate, regular rhythm and normal heart sounds.  No murmur heard. No BLE edema. Pulmonary/Chest: Effort normal and breath sounds normal. No respiratory distress. Abdominal: Soft.  There is no tenderness. Lower extremity edema: area of tenderness, mild erythema and induration on left lower anterior tibia, about 4X4.5 cm in size. Psychiatric: Patient has a normal mood and affect. behavior is normal. Judgment and thought content normal.   Recent Results (from the past 2160 hour(s))  POCT HgB A1C     Status: Abnormal   Collection Time: 08/15/21 12:13 PM  Result Value Ref Range   Hemoglobin A1C 6.3 (A) 4.0 - 5.6 %   HbA1c POC (<> result, manual entry)     HbA1c, POC (prediabetic range)     HbA1c, POC (controlled diabetic range)    Surgical pathology     Status: None   Collection Time: 08/22/21  2:57 PM  Result Value Ref Range   SURGICAL PATHOLOGY      SURGICAL PATHOLOGY CASE: [id number] PATIENT: Chandrika Bencosme Surgical Pathology Report     Clinical History: Vulvar ulcer  (nt)   FINAL MICROSCOPIC DIAGNOSIS:  A. 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  PAS stain is NEGATIVE for fungal elements. There are rare eosinophils in the ulcer which raises the possibility of an irritant/contact dermatitis.  Dr. Cristina Gong (dermatopathologist) reviewed the case and agrees with the above diagnosis.   GROSS DESCRIPTION:  Received in formalin is a tan, soft tissue fragment that is submitted in toto.  Size: 0.6 cm, 1 block submitted.  [facility] 08/25/2021)   Final Diagnosis performed by Thressa Sheller, MD.   Electronically signed 08/29/2021 Technical and / or Professional components performed at [facility]. [facility], [place] [address], [place], [place] [address].  Immunohistochemistry Technical component (if applica ble) was performed at [facility] [address], [place], [place],  [address].   IMMUNOHISTOCHEMISTRY DISCLAIMER (if applicable): Some of these immunohistochemical stains may have been developed and the performance characteristics determine by [facility]. Some may not have been cleared or approved by the U.S. Food and Drug Administration. The FDA has determined that such clearance or approval is not necessary. This test is used for clinical purposes. It should not be regarded as investigational or for research. This laboratory is certified under the [facility] (CLIA-88) as qualified to perform high complexity clinical laboratory testing.  The controls stained appropriately.      PHQ2/9: Depression screen [facility] 2/9 10/07/2021 09/03/2021 08/22/2021 08/15/2021 06/24/2021  Decreased Interest 0 0 0 0 0  Down, Depressed, Hopeless 0 2 0 0 0  PHQ - 2 Score 0 2 0 0 0  Altered sleeping 0 1 0 - -  Tired, decreased energy 0 0 1 - -  Change in appetite 0 0 0 - -  Feeling bad  or failure about yourself  0 0 0 - -  Trouble concentrating 0 0 0 - -  Moving slowly or fidgety/restless 0 0 1 - -  Suicidal thoughts 0 0 0 - -  PHQ-9 Score 0 3 2 - -  Difficult doing work/chores Not difficult at all -  Not difficult at all - -  Some recent data might be hidden    phq 9 is negative    Fall Risk: Fall Risk  10/07/2021 08/15/2021 06/24/2021 04/14/2021 12/13/2020  Falls in the past year? 0 0 0 0 0  Number falls in past yr: 0 - 0 0 0  Injury with Fall? 0 - 0 0 0  Risk for fall due to : No Fall Risks - Impaired balance/gait - -  Risk for fall due to: Comment - - - - -  Follow up Falls prevention discussed Falls prevention discussed Falls prevention discussed - -     Assessment & Plan  1. Lower leg mass, left  - [place] LT LOWER EXTREM LTD SOFT TISSUE NON VASCULAR; Future

## 2021-10-07 ENCOUNTER — Encounter: Payer: Self-pay | Admitting: Family Medicine

## 2021-10-07 ENCOUNTER — Other Ambulatory Visit: Payer: Self-pay

## 2021-10-07 ENCOUNTER — Ambulatory Visit (INDEPENDENT_AMBULATORY_CARE_PROVIDER_SITE_OTHER): Payer: Medicare Other | Admitting: Family Medicine

## 2021-10-07 VITALS — BP 126/66 | HR 84 | Temp 97.6°F | Resp 16 | Ht 62.0 in | Wt 259.9 lb

## 2021-10-07 DIAGNOSIS — R2242 Localized swelling, mass and lump, left lower limb: Secondary | ICD-10-CM | POA: Diagnosis not present

## 2021-10-14 ENCOUNTER — Other Ambulatory Visit: Payer: Self-pay

## 2021-10-14 ENCOUNTER — Ambulatory Visit
Admission: RE | Admit: 2021-10-14 | Discharge: 2021-10-14 | Disposition: A | Discharge status: Home / Self Care | Payer: Medicare Other | Source: Ambulatory Visit | Attending: Family Medicine | Admitting: Family Medicine

## 2021-10-14 ENCOUNTER — Ambulatory Visit: Payer: Medicare Other

## 2021-10-14 DIAGNOSIS — M5416 Radiculopathy, lumbar region: Secondary | ICD-10-CM | POA: Diagnosis not present

## 2021-10-14 DIAGNOSIS — R2242 Localized swelling, mass and lump, left lower limb: Secondary | ICD-10-CM | POA: Diagnosis present

## 2021-10-14 DIAGNOSIS — M7062 Trochanteric bursitis, left hip: Secondary | ICD-10-CM | POA: Diagnosis not present

## 2021-10-14 DIAGNOSIS — M48062 Spinal stenosis, lumbar region with neurogenic claudication: Secondary | ICD-10-CM | POA: Diagnosis not present

## 2021-10-14 DIAGNOSIS — M5136 Other intervertebral disc degeneration, lumbar region: Secondary | ICD-10-CM | POA: Diagnosis not present

## 2021-10-16 DIAGNOSIS — J449 Chronic obstructive pulmonary disease, unspecified: Secondary | ICD-10-CM | POA: Diagnosis not present

## 2021-10-21 DIAGNOSIS — M7062 Trochanteric bursitis, left hip: Secondary | ICD-10-CM | POA: Diagnosis not present

## 2021-11-07 ENCOUNTER — Ambulatory Visit: Payer: Medicare Other | Admitting: Obstetrics and Gynecology

## 2021-11-07 ENCOUNTER — Encounter: Payer: Self-pay | Admitting: Obstetrics and Gynecology

## 2021-11-07 ENCOUNTER — Other Ambulatory Visit: Payer: Self-pay

## 2021-11-07 VITALS — BP 120/74 | Ht 62.0 in | Wt 254.4 lb

## 2021-11-07 DIAGNOSIS — L280 Lichen simplex chronicus: Secondary | ICD-10-CM | POA: Diagnosis not present

## 2021-11-07 NOTE — Progress Notes (Signed)
Patient ID: Amber Avery, female   DOB: 23-Dec-1945, 76 y.o.   MRN: [id number]  Reason for Consult: Gynecologic Exam and Follow-up   Referred by Steele Sizer, MD  Subjective:     HPI:  Amber Avery is a 76 y.o. female. She presents today for follow-up regarding lichen sclerosis and nonhealing vulvar ulcer.  She reports that her bottom has been feeling worse.  She is applying clobetasol ointment only to the side of the ulcer.  She has pain with sitting.  She avoids itching her bottom. She has been working to keep the area clean.  She has urinary incontinence.  She wears an incontinence pad and recently switched to a different type of adult diapers.  Gynecological History  No LMP recorded. Patient is postmenopausal.  Past Medical History:  Diagnosis Date   Asthma    Diabetes mellitus without complication ([place])    Gout    Hyperlipidemia    Hypertension    Iron deficiency anemia 04/20/2018   PONV (postoperative nausea and vomiting)    after hemorroid surgery   Shortness of breath dyspnea    Sleep apnea    Family History  Problem Relation Age of Onset   Cancer Mother        brain tumor   Kidney disease Mother    Hypertension Mother    Heart disease Father    COPD Father    Hypertension Sister    Arthritis Sister    Cancer Sister    Heart murmur Sister    GER disease Sister    Lupus Sister    Diabetes Sister    Arthritis Sister    Osteopenia Sister    Heart disease Sister    Hypertension Sister    Breast cancer Maternal Aunt 19   Leukemia Maternal Aunt    Past Surgical History:  Procedure Laterality Date   APPENDECTOMY     CATARACT EXTRACTION W/PHACO Left 08/14/2020   Procedure: CATARACT EXTRACTION PHACO AND INTRAOCULAR LENS PLACEMENT (IOC) LEFT DIABETIC 3.56 00:59.6 6.0%;  Surgeon: Leandrew Koyanagi, MD;  Location: [facility];  Service: Ophthalmology;  Laterality: Left;  Diabetic   CATARACT EXTRACTION W/PHACO Right 09/04/2020   Procedure:  CATARACT EXTRACTION PHACO AND INTRAOCULAR LENS PLACEMENT ([place]) RIGHT DIABETIC;  Surgeon: Leandrew Koyanagi, MD;  Location: [facility];  Service: Ophthalmology;  Laterality: Right;  4.72 1:03.2 7.4%   CHOLECYSTECTOMY  1977   COLONOSCOPY WITH PROPOFOL N/A 01/21/2016   Procedure: COLONOSCOPY WITH PROPOFOL;  Surgeon: Lucilla Lame, MD;  Location: [facility];  Service: Endoscopy;  Laterality: N/A;   DILATION AND CURETTAGE OF UTERUS     Due to Amenorrhea   ESOPHAGOGASTRODUODENOSCOPY (EGD) WITH PROPOFOL N/A 08/16/2018   Procedure: ESOPHAGOGASTRODUODENOSCOPY (EGD) WITH PROPOFOL;  Surgeon: Lucilla Lame, MD;  Location: [facility];  Service: Endoscopy;  Laterality: N/A;   HEMORRHOID BANDING  1980   HEMORROIDECTOMY     HERNIA REPAIR  2011   Temporal Area Excision Biopsy     For Birth Mark Changes, Negative Pathology   TONSILLECTOMY AND ADENOIDECTOMY      Short Social History:  Social History   Tobacco Use   Smoking status: Former    Packs/day: 2.00    Years: 15.00    Pack years: 30.00    Types: Cigarettes    Quit date: 1983    Years since quitting: 40.0   Smokeless tobacco: Never   Tobacco comments:    smoking cessation materials not required  Substance Use Topics  Alcohol use: Not Currently    Alcohol/week: 0.0 standard drinks    Allergies  Allergen Reactions   Codeine Hives and Nausea And Vomiting    Current Outpatient Medications  Medication Sig Dispense Refill   acetaminophen (TYLENOL) 500 MG tablet Take 1 tablet (500 mg total) by mouth every 6 (six) hours as needed. (Patient taking differently: Take 1,000 mg by mouth every 8 (eight) hours.) 480 tablet 0   albuterol (VENTOLIN HFA) 108 (90 Base) MCG/ACT inhaler Inhale 2 puffs into the lungs every 6 (six) hours as needed for wheezing or shortness of breath. 8 g 0   allopurinol (ZYLOPRIM) 300 MG tablet Take 1 tablet (300 mg total) by mouth daily. 90 tablet 3   atorvastatin (LIPITOR) 40 MG tablet Daily for  cholesterol 90 tablet 3   benazepril (LOTENSIN) 5 MG tablet Take 1 tablet (5 mg total) by mouth daily. 90 tablet 3   clobetasol ointment (TEMOVATE) 0.05 % Apply to affected area every night for 4 weeks, then every other day for 4 weeks and then twice a week for 4 weeks or until resolution. 30 g 5   diclofenac Sodium (VOLTAREN) 1 % GEL APPLY 4 GRAMS TOPICALLY 4 TIMES DAILY AS DIRECTED 300 g 3   Dulaglutide (TRULICITY) 1.5 VO/1.6WV SOPN Inject 1.5 mg into the skin once a week. 12 pen 3   furosemide (LASIX) 40 MG tablet Take 1 tablet (40 mg total) by mouth daily. 90 tablet 3   gabapentin (NEURONTIN) 300 MG capsule TAKE 1 CAPSULE BY MOUTH IN THE MORNING AND 1 IN THE EVENING AND 2 AT NIGHT 360 capsule 3   glucose blood (ONETOUCH VERIO) test strip USE AS DIRECTED 50 each 1   Lancets (ONETOUCH ULTRASOFT) lancets Use as instructed 100 each 12   potassium chloride SA (KLOR-CON M20) 20 MEQ tablet Take 1 tablet (20 mEq total) by mouth daily. 90 tablet 3   triamcinolone (KENALOG) 0.1 % Apply topically.     triamcinolone ointment (KENALOG) 0.1 % APPLY TOPICALLY TWICE DAILY AS NEEDED 90 g 0   vitamin B-12 (CYANOCOBALAMIN) 500 MCG tablet Take 500 mcg by mouth daily.     Vitamin D, Cholecalciferol, 25 MCG (1000 UT) TABS Take 1 capsule by mouth daily. 90 tablet 1   No current facility-administered medications for this visit.    Review of Systems  Constitutional: Negative for chills, fatigue, fever and unexpected weight change.  HENT: Negative for trouble swallowing.  Eyes: Negative for loss of vision.  Respiratory: Negative for cough, shortness of breath and wheezing.  Cardiovascular: Negative for chest pain, leg swelling, palpitations and syncope.  GI: Negative for abdominal pain, blood in stool, diarrhea, nausea and vomiting.  GU: Negative for difficulty urinating, dysuria, frequency and hematuria.  Musculoskeletal: Negative for back pain, leg pain and joint pain.  Skin: Negative for rash.   Neurological: Negative for dizziness, headaches, light-headedness, numbness and seizures.  Psychiatric: Negative for behavioral problem, confusion, depressed mood and sleep disturbance.       Objective:  Objective   Vitals:   11/07/21 1053  BP: 120/74  Weight: 254 lb 6.4 oz (115.4 kg)  Height: 5\' 2"  (1.575 m)   Body mass index is 46.53 kg/m.  Physical Exam Vitals and nursing note reviewed. Exam conducted with a chaperone present.  Constitutional:      Appearance: Normal appearance. She is well-developed.  HENT:     Head: Normocephalic and atraumatic.  Eyes:     Extraocular Movements: Extraocular movements intact.  Pupils: Pupils are equal, round, and reactive to light.  Cardiovascular:     Rate and Rhythm: Normal rate and regular rhythm.  Pulmonary:     Effort: Pulmonary effort is normal. No respiratory distress.     Breath sounds: Normal breath sounds.  Abdominal:     General: Abdomen is flat.     Palpations: Abdomen is soft.  Genitourinary:   Musculoskeletal:        General: No signs of injury.  Skin:    General: Skin is warm and dry.  Neurological:     Mental Status: She is alert and oriented to person, place, and time.  Psychiatric:        Behavior: Behavior normal.        Thought Content: Thought content normal.        Judgment: Judgment normal.    Assessment/Plan:     Bottom has not been healing well.  Groin is more erythematous than previously. Ulcerative lesion is still open and present. Poor healing likely secondary to age and diabetic status.  Recommended applying Neosporin to the ulcerative lesion. Applying clobetasol to area between butt cheeks. Switching back to prior brand of incontinence pads. Using cornstarch as needed for pattern to keep bottom dry.  More than 15 minutes were spent face to face with the patient in the room, reviewing the medical record, labs and images, and coordinating care for the patient. The plan of management was  discussed in detail and counseling was provided.    Adrian Prows MD [facility] 11/07/2021 11:20 AM

## 2021-11-14 DIAGNOSIS — R2689 Other abnormalities of gait and mobility: Secondary | ICD-10-CM | POA: Diagnosis not present

## 2021-11-14 DIAGNOSIS — M79605 Pain in left leg: Secondary | ICD-10-CM | POA: Diagnosis not present

## 2021-11-14 DIAGNOSIS — M799 Soft tissue disorder, unspecified: Secondary | ICD-10-CM | POA: Diagnosis not present

## 2021-11-14 DIAGNOSIS — M545 Low back pain, unspecified: Secondary | ICD-10-CM | POA: Diagnosis not present

## 2021-11-16 DIAGNOSIS — J449 Chronic obstructive pulmonary disease, unspecified: Secondary | ICD-10-CM | POA: Diagnosis not present

## 2021-11-19 ENCOUNTER — Ambulatory Visit: Payer: Medicare Other | Admitting: Obstetrics and Gynecology

## 2021-11-19 DIAGNOSIS — R2689 Other abnormalities of gait and mobility: Secondary | ICD-10-CM | POA: Diagnosis not present

## 2021-11-19 DIAGNOSIS — M79605 Pain in left leg: Secondary | ICD-10-CM | POA: Diagnosis not present

## 2021-11-19 DIAGNOSIS — M545 Low back pain, unspecified: Secondary | ICD-10-CM | POA: Diagnosis not present

## 2021-11-19 DIAGNOSIS — M799 Soft tissue disorder, unspecified: Secondary | ICD-10-CM | POA: Diagnosis not present

## 2021-11-21 DIAGNOSIS — M799 Soft tissue disorder, unspecified: Secondary | ICD-10-CM | POA: Diagnosis not present

## 2021-11-21 DIAGNOSIS — R2689 Other abnormalities of gait and mobility: Secondary | ICD-10-CM | POA: Diagnosis not present

## 2021-11-21 DIAGNOSIS — M545 Low back pain, unspecified: Secondary | ICD-10-CM | POA: Diagnosis not present

## 2021-11-21 DIAGNOSIS — M79605 Pain in left leg: Secondary | ICD-10-CM | POA: Diagnosis not present

## 2021-11-26 DIAGNOSIS — M799 Soft tissue disorder, unspecified: Secondary | ICD-10-CM | POA: Diagnosis not present

## 2021-11-26 DIAGNOSIS — M79605 Pain in left leg: Secondary | ICD-10-CM | POA: Diagnosis not present

## 2021-11-26 DIAGNOSIS — M545 Low back pain, unspecified: Secondary | ICD-10-CM | POA: Diagnosis not present

## 2021-11-26 DIAGNOSIS — R2689 Other abnormalities of gait and mobility: Secondary | ICD-10-CM | POA: Diagnosis not present

## 2021-11-28 DIAGNOSIS — R2689 Other abnormalities of gait and mobility: Secondary | ICD-10-CM | POA: Diagnosis not present

## 2021-11-28 DIAGNOSIS — M799 Soft tissue disorder, unspecified: Secondary | ICD-10-CM | POA: Diagnosis not present

## 2021-11-28 DIAGNOSIS — M79605 Pain in left leg: Secondary | ICD-10-CM | POA: Diagnosis not present

## 2021-11-28 DIAGNOSIS — M545 Low back pain, unspecified: Secondary | ICD-10-CM | POA: Diagnosis not present

## 2021-12-16 DIAGNOSIS — J449 Chronic obstructive pulmonary disease, unspecified: Secondary | ICD-10-CM | POA: Diagnosis not present

## 2021-12-16 NOTE — Progress Notes (Signed)
Name: Amber Avery   MRN: [id number]    DOB: Mar 07, 1946   Date:12/17/2021       Progress Note  Subjective  Chief Complaint  Follow up   HPI  HTN:   She is now on benazepril 5 mg,. No chest pain, palpitation or change in exercise tolerance   Dyslipidemia: taking statin therapy, no side effects, no chest pain or myalgia. Last LDL was at goal and discussed with patient ,we will recheck level   Asthma Mild intermittent: she states she has been doing well, no wheezing, cough , mild SOB with activity but likely multifactorial. Unchanged   DMII: . She is on Trulicity, no polyphagia, polyuria (stable because of diuretic) denies  polydipsia . She has dyslipidemia and is doing well on statin therapy , CKI stage III she is on low dose ACE, also has neuropathy that is stable on gabapentin. We will send her to the lab to get A1C done   Morbid obesity: BMI over 40 , weight is down 2 lbs since October 2022, she states cooking more often at home, healthy options.     OSA: she wear machine every night, she uses oxygen with CPAP and is compliant.     OA : she saw Ortho , Dr. Candelaria Stagers, and he advised her not to have surgery because of her weight, she had one round of hyaluronic injection without help and it was too expensive . She is still taking Tylenol She still has intermittent right knee effusion when more active, no redness or increase in warmth. She has a hoveround, uses walker and sometimes cane. Back continues to be the worse pain     Iron deficiency anemia: last level was better, under the care of Dr. Tasia Catchings and had 5 iron infusions,  normal HCT  02/22 and ferritin also improved , last visit with GI was with Dr. Allen Norris - GI in 2020 We will recheck labs       Senile purpura: stable , reassurance given . Stable    DDD lumbar spine and radiculitis: seen by Psyatrist , seeing Whitney FNP, she has MRI lumbar spine, steroid injection for trochanteric bursitis did not work, she had 5 sessions but caused her to  feel very achy. She is now doing the exercises at home because it helped a little with pain during the day but still very painful at night . Pain affects her ability to get comfortable in bed and fall asleep She is taking Gabapentin    Patient Active Problem List   Diagnosis Date Noted   Gait instability 12/13/2020   Stage 3a chronic kidney disease ([place]) 12/13/2020   Cellulitis of right ankle 04/12/2020   Morbid obesity with BMI of 45.0-49.9, adult ([place]) 02/06/2020   History of gastritis 08/14/2019   Angiodysplasia of stomach and duodenum    Iron deficiency anemia 04/20/2018   Anemia, unspecified 51/88/4166   Lichen sclerosus of female genitalia 06/29/2016   Primary osteoarthritis of both knees 04/07/2016   Special screening for malignant neoplasms, colon    Right thyroid nodule 06/26/2015   Asthma, intermittent 04/03/2015   Benign essential HTN 04/03/2015   Carpal tunnel syndrome 04/03/2015   Chronic kidney disease (CKD), stage II (mild) 04/03/2015   Controlled gout 04/03/2015   Arteriosclerosis of coronary artery 04/03/2015   Diabetes mellitus with renal manifestations, controlled ([place]) 04/03/2015   Dyslipidemia 04/03/2015   Edema extremities 04/03/2015   Elevated sedimentation rate 04/03/2015   Knee pain 04/03/2015   Lumbar radiculitis 04/03/2015  Obstructive apnea 04/03/2015   Lumbosacral spondylosis 04/03/2015   Osteoarthritis, chronic 04/03/2015   Psoriasis 04/03/2015   Vitamin D deficiency 04/03/2015   Varicose veins 04/03/2015   Goiter 12/23/2014   Degeneration of intervertebral disc of lumbar region 08/23/2014   Corns and callosity 06/11/2009    Past Surgical History:  Procedure Laterality Date   APPENDECTOMY     CATARACT EXTRACTION W/PHACO Left 08/14/2020   Procedure: CATARACT EXTRACTION PHACO AND INTRAOCULAR LENS PLACEMENT ([facility]) LEFT DIABETIC 3.56 00:59.6 6.0%;  Surgeon: Leandrew Koyanagi, MD;  Location: [place];  Service: Ophthalmology;   Laterality: Left;  Diabetic   CATARACT EXTRACTION W/PHACO Right 09/04/2020   Procedure: CATARACT EXTRACTION PHACO AND INTRAOCULAR LENS PLACEMENT ([place]) RIGHT DIABETIC;  Surgeon: Leandrew Koyanagi, MD;  Location: [place];  Service: Ophthalmology;  Laterality: Right;  4.72 1:03.2 7.4%   CHOLECYSTECTOMY  1977   COLONOSCOPY WITH PROPOFOL N/A 01/21/2016   Procedure: COLONOSCOPY WITH PROPOFOL;  Surgeon: Lucilla Lame, MD;  Location: [facility];  Service: Endoscopy;  Laterality: N/A;   DILATION AND CURETTAGE OF UTERUS     Due to Amenorrhea   ESOPHAGOGASTRODUODENOSCOPY (EGD) WITH PROPOFOL N/A 08/16/2018   Procedure: ESOPHAGOGASTRODUODENOSCOPY (EGD) WITH PROPOFOL;  Surgeon: Lucilla Lame, MD;  Location: [facility];  Service: Endoscopy;  Laterality: N/A;   [place]     HERNIA REPAIR  2011   Temporal Area Excision Biopsy     For Birth Mark Changes, Negative Pathology   TONSILLECTOMY AND ADENOIDECTOMY      Family History  Problem Relation Age of Onset   Cancer Mother        brain tumor   Kidney disease Mother    Hypertension Mother    Heart disease Father    COPD Father    Hypertension Sister    Arthritis Sister    Cancer Sister    Heart murmur Sister    GER disease Sister    Lupus Sister    Diabetes Sister    Arthritis Sister    Osteopenia Sister    Heart disease Sister    Hypertension Sister    Breast cancer Maternal Aunt 85   Leukemia Maternal Aunt     Social History   Tobacco Use   Smoking status: Former    Packs/day: 2.00    Years: 15.00    Pack years: 30.00    Types: Cigarettes    Quit date: 1983    Years since quitting: 40.1   Smokeless tobacco: Never   Tobacco comments:    smoking cessation materials not required  Substance Use Topics   Alcohol use: Not Currently    Alcohol/week: 0.0 standard drinks     Current Outpatient Medications:    acetaminophen (TYLENOL) 500 MG tablet, Take 1 tablet (500 mg total) by  mouth every 6 (six) hours as needed. (Patient taking differently: Take 1,000 mg by mouth every 8 (eight) hours.), Disp: 480 tablet, Rfl: 0   albuterol (VENTOLIN HFA) 108 (90 Base) MCG/ACT inhaler, Inhale 2 puffs into the lungs every 6 (six) hours as needed for wheezing or shortness of breath., Disp: 8 g, Rfl: 0   allopurinol (ZYLOPRIM) 300 MG tablet, Take 1 tablet (300 mg total) by mouth daily., Disp: 90 tablet, Rfl: 3   atorvastatin (LIPITOR) 40 MG tablet, Daily for cholesterol, Disp: 90 tablet, Rfl: 3   benazepril (LOTENSIN) 5 MG tablet, Take 1 tablet (5 mg total) by mouth daily., Disp: 90 tablet, Rfl: 3  clobetasol ointment (TEMOVATE) 0.05 %, Apply to affected area every night for 4 weeks, then every other day for 4 weeks and then twice a week for 4 weeks or until resolution., Disp: 30 g, Rfl: 5   diclofenac Sodium (VOLTAREN) 1 % GEL, APPLY 4 GRAMS TOPICALLY 4 TIMES DAILY AS DIRECTED, Disp: 300 g, Rfl: 3   Dulaglutide (TRULICITY) 1.5 BP/1.0CH SOPN, Inject 1.5 mg into the skin once a week., Disp: 12 pen, Rfl: 3   furosemide (LASIX) 40 MG tablet, Take 1 tablet (40 mg total) by mouth daily., Disp: 90 tablet, Rfl: 3   gabapentin (NEURONTIN) 300 MG capsule, TAKE 1 CAPSULE BY MOUTH IN THE MORNING AND 1 IN THE EVENING AND 2 AT NIGHT, Disp: 360 capsule, Rfl: 3   glucose blood (ONETOUCH VERIO) test strip, USE AS DIRECTED, Disp: 50 each, Rfl: 1   Lancets (ONETOUCH ULTRASOFT) lancets, Use as instructed, Disp: 100 each, Rfl: 12   potassium chloride SA (KLOR-CON M20) 20 MEQ tablet, Take 1 tablet (20 mEq total) by mouth daily., Disp: 90 tablet, Rfl: 3   triamcinolone (KENALOG) 0.1 %, Apply topically., Disp: , Rfl:    triamcinolone ointment (KENALOG) 0.1 %, APPLY TOPICALLY TWICE DAILY AS NEEDED, Disp: 90 g, Rfl: 0   vitamin B-12 (CYANOCOBALAMIN) 500 MCG tablet, Take 500 mcg by mouth daily., Disp: , Rfl:    Vitamin D, Cholecalciferol, 25 MCG (1000 UT) TABS, Take 1 capsule by mouth daily., Disp: 90 tablet, Rfl:  1  Allergies  Allergen Reactions   Codeine Hives and Nausea And Vomiting    I personally reviewed active problem list, medication list, allergies, family history, social history with the patient/caregiver today.   ROS  Constitutional: Negative for fever or weight change.  Respiratory: Negative for cough and shortness of breath.   Cardiovascular: Negative for chest pain or palpitations.  Gastrointestinal: Negative for abdominal pain, no bowel changes.  Musculoskeletal: Negative for gait problem or joint swelling.  Skin: Negative for rash.  Neurological: Negative for dizziness or headache.  No other specific complaints in a complete review of systems (except as listed in HPI above).   Objective  Vitals:   12/17/21 1057  BP: 124/70  Pulse: 78  Resp: 16  Temp: 97.6 F (36.4 C)  TempSrc: Oral  SpO2: 94%  Weight: 258 lb 6.4 oz (117.2 kg)  Height: 5\' 2"  (1.575 m)    Body mass index is 47.26 kg/m.  Physical Exam  Constitutional: Patient appears well-developed and well-nourished. Obese  No distress.  HEENT: head atraumatic, normocephalic, pupils equal and reactive to light, neck supple, Cardiovascular: Normal rate, regular rhythm and normal heart sounds.  No murmur heard. No BLE edema. Pulmonary/Chest: Effort normal and breath sounds normal. No respiratory distress. Abdominal: Soft.  There is no tenderness. Psychiatric: Patient has a normal mood and affect. behavior is normal. Judgment and thought content normal.  Muscular skeletal: pain during palpation of lumbar spine, negative straight leg raise    PHQ2/9: Depression screen [facility] 2/9 12/17/2021 10/07/2021 09/03/2021 08/22/2021 08/15/2021  Decreased Interest 0 0 0 0 0  Down, Depressed, Hopeless 0 0 2 0 0  PHQ - 2 Score 0 0 2 0 0  Altered sleeping 0 0 1 0 -  Tired, decreased energy 0 0 0 1 -  Change in appetite 0 0 0 0 -  Feeling bad or failure about yourself  0 0 0 0 -  Trouble concentrating 0 0 0 0 -  Moving slowly or  fidgety/restless 0 0  0 1 -  Suicidal thoughts 0 0 0 0 -  PHQ-9 Score 0 0 3 2 -  Difficult doing work/chores Not difficult at all Not difficult at all - Not difficult at all -  Some recent data might be hidden    phq 9 is negative   Fall Risk: Fall Risk  12/17/2021 10/07/2021 08/15/2021 06/24/2021 04/14/2021  Falls in the past year? 0 0 0 0 0  Number falls in past yr: 0 0 - 0 0  Injury with Fall? 0 0 - 0 0  Risk for fall due to : No Fall Risks No Fall Risks - Impaired balance/gait -  Risk for fall due to: Comment - - - - -  Follow up Falls prevention discussed Falls prevention discussed Falls prevention discussed Falls prevention discussed -     Functional Status Survey: Is the patient deaf or have difficulty hearing?: No Does the patient have difficulty seeing, even when wearing glasses/contacts?: Yes Does the patient have difficulty concentrating, remembering, or making decisions?: No Does the patient have difficulty walking or climbing stairs?: Yes Does the patient have difficulty dressing or bathing?: No Does the patient have difficulty doing errands alone such as visiting a doctor's office or shopping?: No   Assessment & Plan  1. Hypertension associated with type 2 diabetes mellitus (HCC)  - Microalbumin / creatinine urine ratio - COMPLETE METABOLIC PANEL WITH GFR - Hemoglobin A1c  2. Morbid obesity with BMI of 45.0-49.9, adult [facility])  Discussed with the patient the risk posed by an increased BMI. Discussed importance of portion control, calorie counting and at least 150 minutes of physical activity weekly. Avoid sweet beverages and drink more water. Eat at least 6 servings of fruit and vegetables daily    3. Lumbar back pain with radiculopathy affecting left lower extremity  Keep follow up with Dr. Garen Grams   4. Senile purpura (HCC)  Stable   5. Stage 3a chronic kidney disease (HCC)  - COMPLETE METABOLIC PANEL WITH GFR  6. Benign essential HTN   7. Bilateral  edema of lower extremity   8. B12 deficiency  - CBC with Differential/Platelet - Vitamin B12  9. Dyslipidemia  - Lipid panel  10. Controlled gout  No recent episodes   11. Obstructive apnea   12. Osteoarthritis, chronic   13. Asthma, mild intermittent, well-controlled   14. Vitamin D deficiency  - VITAMIN D 25 Hydroxy (Vit-D Deficiency, Fractures)  15. Left sided sciatica   16. History of iron deficiency anemia  - CBC with Differential/Platelet  17. Greater trochanteric bursitis, left   18. Lichen sclerosus of female genitalia

## 2021-12-17 ENCOUNTER — Other Ambulatory Visit (INDEPENDENT_AMBULATORY_CARE_PROVIDER_SITE_OTHER): Payer: Medicare Other

## 2021-12-17 ENCOUNTER — Encounter: Payer: Self-pay | Admitting: Family Medicine

## 2021-12-17 ENCOUNTER — Ambulatory Visit (INDEPENDENT_AMBULATORY_CARE_PROVIDER_SITE_OTHER): Payer: Medicare Other | Admitting: Family Medicine

## 2021-12-17 VITALS — BP 124/70 | HR 78 | Temp 97.6°F | Resp 16 | Ht 62.0 in | Wt 258.4 lb

## 2021-12-17 DIAGNOSIS — G4733 Obstructive sleep apnea (adult) (pediatric): Secondary | ICD-10-CM

## 2021-12-17 DIAGNOSIS — I10 Essential (primary) hypertension: Secondary | ICD-10-CM

## 2021-12-17 DIAGNOSIS — N1831 Chronic kidney disease, stage 3a: Secondary | ICD-10-CM | POA: Diagnosis not present

## 2021-12-17 DIAGNOSIS — M5432 Sciatica, left side: Secondary | ICD-10-CM

## 2021-12-17 DIAGNOSIS — M109 Gout, unspecified: Secondary | ICD-10-CM | POA: Diagnosis not present

## 2021-12-17 DIAGNOSIS — M7062 Trochanteric bursitis, left hip: Secondary | ICD-10-CM

## 2021-12-17 DIAGNOSIS — E785 Hyperlipidemia, unspecified: Secondary | ICD-10-CM

## 2021-12-17 DIAGNOSIS — E559 Vitamin D deficiency, unspecified: Secondary | ICD-10-CM | POA: Diagnosis not present

## 2021-12-17 DIAGNOSIS — R600 Localized edema: Secondary | ICD-10-CM

## 2021-12-17 DIAGNOSIS — E1159 Type 2 diabetes mellitus with other circulatory complications: Secondary | ICD-10-CM | POA: Diagnosis not present

## 2021-12-17 DIAGNOSIS — J4520 Mild intermittent asthma, uncomplicated: Secondary | ICD-10-CM

## 2021-12-17 DIAGNOSIS — Z862 Personal history of diseases of the blood and blood-forming organs and certain disorders involving the immune mechanism: Secondary | ICD-10-CM | POA: Diagnosis not present

## 2021-12-17 DIAGNOSIS — N904 Leukoplakia of vulva: Secondary | ICD-10-CM

## 2021-12-17 DIAGNOSIS — I152 Hypertension secondary to endocrine disorders: Secondary | ICD-10-CM

## 2021-12-17 DIAGNOSIS — E538 Deficiency of other specified B group vitamins: Secondary | ICD-10-CM

## 2021-12-17 DIAGNOSIS — M1990 Unspecified osteoarthritis, unspecified site: Secondary | ICD-10-CM | POA: Diagnosis not present

## 2021-12-17 DIAGNOSIS — M5416 Radiculopathy, lumbar region: Secondary | ICD-10-CM

## 2021-12-17 DIAGNOSIS — D692 Other nonthrombocytopenic purpura: Secondary | ICD-10-CM

## 2021-12-17 DIAGNOSIS — Z6841 Body Mass Index (BMI) 40.0 and over, adult: Secondary | ICD-10-CM

## 2021-12-18 LAB — COMPLETE METABOLIC PANEL WITHOUT GFR
AG Ratio: 1.8 (calc) (ref 1.0–2.5)
ALT: 19 U/L (ref 6–29)
AST: 24 U/L (ref 10–35)
Albumin: 4.2 g/dL (ref 3.6–5.1)
Alkaline phosphatase (APISO): 82 U/L (ref 37–153)
BUN/Creatinine Ratio: 23 (calc) — ABNORMAL HIGH (ref 6–22)
BUN: 29 mg/dL — ABNORMAL HIGH (ref 7–25)
CO2: 30 mmol/L (ref 20–32)
Calcium: 10 mg/dL (ref 8.6–10.4)
Chloride: 103 mmol/L (ref 98–110)
Creat: 1.28 mg/dL — ABNORMAL HIGH (ref 0.60–1.00)
Globulin: 2.4 g/dL (ref 1.9–3.7)
Glucose, Bld: 115 mg/dL — ABNORMAL HIGH (ref 65–99)
Potassium: 4.9 mmol/L (ref 3.5–5.3)
Sodium: 140 mmol/L (ref 135–146)
Total Bilirubin: 0.7 mg/dL (ref 0.2–1.2)
Total Protein: 6.6 g/dL (ref 6.1–8.1)
eGFR: 44 mL/min/{1.73_m2} — ABNORMAL LOW

## 2021-12-18 LAB — CBC WITH DIFFERENTIAL/PLATELET
Absolute Monocytes: 794 {cells}/uL (ref 200–950)
Basophils Absolute: 62 {cells}/uL (ref 0–200)
Basophils Relative: 0.9 %
Eosinophils Absolute: 248 {cells}/uL (ref 15–500)
Eosinophils Relative: 3.6 %
HCT: 38 % (ref 35.0–45.0)
Hemoglobin: 12.7 g/dL (ref 11.7–15.5)
Lymphs Abs: 1594 {cells}/uL (ref 850–3900)
MCH: 32.5 pg (ref 27.0–33.0)
MCHC: 33.4 g/dL (ref 32.0–36.0)
MCV: 97.2 fL (ref 80.0–100.0)
MPV: 10.7 fL (ref 7.5–12.5)
Monocytes Relative: 11.5 %
Neutro Abs: 4202 {cells}/uL (ref 1500–7800)
Neutrophils Relative %: 60.9 %
Platelets: 220 10*3/uL (ref 140–400)
RBC: 3.91 Million/uL (ref 3.80–5.10)
RDW: 13.6 % (ref 11.0–15.0)
Total Lymphocyte: 23.1 %
WBC: 6.9 10*3/uL (ref 3.8–10.8)

## 2021-12-18 LAB — HEMOGLOBIN A1C
Hgb A1c MFr Bld: 6.6 %{Hb} — ABNORMAL HIGH
Mean Plasma Glucose: 143 mg/dL
eAG (mmol/L): 7.9 mmol/L

## 2021-12-18 LAB — VITAMIN D 25 HYDROXY (VIT D DEFICIENCY, FRACTURES): Vit D, 25-Hydroxy: 37 ng/mL (ref 30–100)

## 2021-12-18 LAB — LIPID PANEL
Cholesterol: 158 mg/dL
HDL: 53 mg/dL
LDL Cholesterol (Calc): 83 mg/dL
Non-HDL Cholesterol (Calc): 105 mg/dL
Total CHOL/HDL Ratio: 3 (calc)
Triglycerides: 128 mg/dL

## 2021-12-18 LAB — MICROALBUMIN / CREATININE URINE RATIO
Creatinine, Urine: 34 mg/dL (ref 20–275)
Microalb Creat Ratio: 41 ug/mg{creat} — ABNORMAL HIGH
Microalb, Ur: 1.4 mg/dL

## 2021-12-18 LAB — VITAMIN B12: Vitamin B-12: 1050 pg/mL (ref 200–1100)

## 2021-12-21 ENCOUNTER — Encounter: Payer: Self-pay | Admitting: Emergency Medicine

## 2021-12-21 ENCOUNTER — Emergency Department: Payer: Medicare Other

## 2021-12-21 ENCOUNTER — Other Ambulatory Visit: Payer: Self-pay

## 2021-12-21 ENCOUNTER — Emergency Department
Admission: EM | Admit: 2021-12-21 | Discharge: 2021-12-21 | Disposition: A | Discharge status: Home / Self Care | Payer: Medicare Other | Attending: Emergency Medicine | Admitting: Emergency Medicine

## 2021-12-21 DIAGNOSIS — M549 Dorsalgia, unspecified: Secondary | ICD-10-CM | POA: Diagnosis not present

## 2021-12-21 DIAGNOSIS — Z131 Encounter for screening for diabetes mellitus: Secondary | ICD-10-CM | POA: Insufficient documentation

## 2021-12-21 DIAGNOSIS — M4316 Spondylolisthesis, lumbar region: Secondary | ICD-10-CM | POA: Diagnosis not present

## 2021-12-21 DIAGNOSIS — M545 Low back pain, unspecified: Secondary | ICD-10-CM | POA: Diagnosis not present

## 2021-12-21 DIAGNOSIS — R52 Pain, unspecified: Secondary | ICD-10-CM | POA: Diagnosis not present

## 2021-12-21 DIAGNOSIS — I959 Hypotension, unspecified: Secondary | ICD-10-CM | POA: Diagnosis not present

## 2021-12-21 DIAGNOSIS — M5442 Lumbago with sciatica, left side: Secondary | ICD-10-CM | POA: Insufficient documentation

## 2021-12-21 LAB — CBG MONITORING, ED: Glucose-Capillary: 135 mg/dL — ABNORMAL HIGH (ref 70–99)

## 2021-12-21 MED ORDER — METHOCARBAMOL 500 MG PO TABS: 500.0000 mg | ORAL_TABLET | Freq: Four times a day (QID) | ORAL | 0 refills | Status: DC | PRN

## 2021-12-21 MED ORDER — ORPHENADRINE CITRATE 30 MG/ML IJ SOLN
60.0000 mg | Freq: Two times a day (BID) | INTRAMUSCULAR | Status: DC
  Administered 2021-12-21: 60 mg via INTRAMUSCULAR
  Filled 2021-12-21: qty 2

## 2021-12-21 MED ORDER — LIDOCAINE 5 % EX PTCH: 1.0000 | MEDICATED_PATCH | Freq: Two times a day (BID) | CUTANEOUS | 0 refills | Status: DC

## 2021-12-21 MED ORDER — LIDOCAINE 5 % EX PTCH
1.0000 | MEDICATED_PATCH | CUTANEOUS | Status: DC
  Administered 2021-12-21: 1 via TRANSDERMAL
  Filled 2021-12-21: qty 1

## 2021-12-21 MED ORDER — DEXAMETHASONE SODIUM PHOSPHATE 10 MG/ML IJ SOLN
10.0000 mg | Freq: Once | INTRAMUSCULAR | Status: AC
  Administered 2021-12-21: 10 mg via INTRAMUSCULAR
  Filled 2021-12-21: qty 1

## 2021-12-21 MED ORDER — ACETAMINOPHEN 325 MG PO TABS
650.0000 mg | ORAL_TABLET | Freq: Once | ORAL | Status: AC
  Administered 2021-12-21: 650 mg via ORAL
  Filled 2021-12-21: qty 2

## 2021-12-21 MED ORDER — KETOROLAC TROMETHAMINE 30 MG/ML IJ SOLN
30.0000 mg | Freq: Once | INTRAMUSCULAR | Status: AC
  Administered 2021-12-21: 30 mg via INTRAMUSCULAR
  Filled 2021-12-21: qty 1

## 2021-12-21 MED ORDER — PREDNISONE 20 MG PO TABS: ORAL_TABLET | ORAL | 0 refills | Status: DC

## 2021-12-21 NOTE — ED Notes (Signed)
Provided snacks and water for pt. ?

## 2021-12-21 NOTE — ED Provider Notes (Signed)
? ?[facility] ?Provider Note ? ? ? Event Date/Time  ? First MD Initiated Contact with Patient 12/21/21 [phone]   ?  (approximate) ? ? ?History  ? ?Back Pain ? ? ?HPI ? ?Amber Avery is a 76 y.o. female is brought to the ED via EMS with complaint of low back pain with radiation down her left lower extremity.  Patient has a history of sciatica and is currently being seen by physical therapy and also Dr. Raelyn Mora [facility] at [facility].  Patient has had facet joint injections and also is currently taking gabapentin and Tylenol.  Patient reports that this morning she was trying to get up from the toilet when she began having severe sciatic pain.  She denies any injury or fall.  Patient denies any new symptoms such as incontinence of bowel or bladder or symptoms to suggest cauda equina. ?  ? ? ?Physical Exam  ? ?Triage Vital Signs: ?ED Triage Vitals  ?Enc Vitals Group  ?   BP 12/21/21 0928 130/68  ?   Pulse Rate 12/21/21 0928 91  ?   Resp 12/21/21 0928 18  ?   Temp 12/21/21 0928 97.7 ?F (36.5 ?C)  ?   Temp Source 12/21/21 0928 Oral  ?   SpO2 12/21/21 0928 95 %  ?   Weight 12/21/21 0932 258 lb 6.1 oz (117.2 kg)  ?   Height 12/21/21 0932 '5\' 2"'$  (1.575 m)  ?   Head Circumference --   ?   Peak Flow --   ?   Pain Score 12/21/21 0932 10  ?   Pain Loc --   ?   Pain Edu? --   ?   Excl. in [place]? --   ? ? ?Most recent vital signs: ?Vitals:  ? 12/21/21 0928 12/21/21 1200  ?BP: 130/68 132/70  ?Pulse: 91 88  ?Resp: 18 18  ?Temp: 97.7 ?F (36.5 ?C)   ?SpO2: 95% 96%  ? ? ? ?General: Awake, no distress.  ?CV:  Good peripheral perfusion.  ?Resp:  Normal effort.  ?Abd:  No distention.  Soft, nontender, bowel sounds normoactive x4 quadrants. ?Other:  Marked tenderness on palpation of the left SI joint area and left paravertebral muscles in the L5-S1 region.  Range of motion is extremely difficult as patient cannot tolerate any movement.  ? ?ED Results / Procedures / Treatments  ? ?Labs ?(all labs ordered are listed, but  only abnormal results are displayed) ?Labs Reviewed  ?CBG MONITORING, ED - Abnormal; Notable for the following components:  ?    Result Value  ? Glucose-Capillary 135 (*)   ? All other components within normal limits  ? ? ? ? ?RADIOLOGY ? ?Lumbar spine x-rays were reviewed and negative for compression fracture.  Radiology report is positive for multilevel degenerative changes. ? ? ?PROCEDURES: ? ?Critical Care performed:  ? ?Procedures ? ? ?MEDICATIONS ORDERED IN ED: ?Medications  ?orphenadrine (NORFLEX) injection 60 mg (60 mg Intramuscular Given 12/21/21 1012)  ?lidocaine (LIDODERM) 5 % 1 patch (1 patch Transdermal Patch Applied 12/21/21 1508)  ?ketorolac (TORADOL) 30 MG/ML injection 30 mg (30 mg Intramuscular Given 12/21/21 1008)  ?dexamethasone (DECADRON) injection 10 mg (10 mg Intramuscular Given 12/21/21 1116)  ?acetaminophen (TYLENOL) tablet 650 mg (650 mg Oral Given 12/21/21 1254)  ? ? ? ?IMPRESSION / MDM / ASSESSMENT AND PLAN / ED COURSE  ?I reviewed the triage vital signs and the nursing notes. ? ? ?Differential diagnosis includes, but is not limited to, low  back pain with left leg sciatica, compression fracture, muscle strain, chronic back pain. ? ?76 year old female presents to the ED via EMS after she was unable to get off the toilet this morning due to an acute exacerbation of her low back pain with left leg sciatica.  Patient denies any new injuries.  She states that she did make her bed yesterday do laundry and multiple other task around the home which aggravated her back.  She states that it was not until she went to the bathroom that she realized how bad it was.  A neighbor heard her hollering for help and called her sister who called EMS.  Patient was given Toradol 30 mg IM, Norflex 60 mg IM, Decadron 10 mg and Tylenol p.o.  Patient was sent for x-ray to exclude compression fracture exacerbating her pain.  This was negative and showed degenerative changes.  A Lidoderm patch was applied and patient was  ambulated in the hallway and actually went to the bathroom with some assistance and came back with the assistance of a walker.  Patient states that she has 2 walkers at home 1 that is exactly like the one that she used while she was in the ED.  Patient has an appointment with Dr.Chasnis this Tuesday at [facility].  Nonfasting glucose was 135 while in the ED and patient states that her blood sugars are normally in this range.  Patient was discharged with prescription for prednisone for 40 mg daily for the next 5 days and methocarbamol as needed for muscle spasms.  She also has prescription for Lidoderm patches and will continue taking Tylenol and gabapentin as prescribed by her doctor.  She is encouraged to use ice or heat to her back as needed for discomfort and also continue using 2 pillows under her knees when she is lying in bed. ? ? ? ?  ? ? ?FINAL CLINICAL IMPRESSION(S) / ED DIAGNOSES  ? ?Final diagnoses:  ?Left-sided low back pain with left-sided sciatica, unspecified chronicity  ? ? ? ?Rx / DC Orders  ? ?ED Discharge Orders   ? ?      Ordered  ?  predniSONE (DELTASONE) 20 MG tablet       ? 12/21/21 1456  ?  methocarbamol (ROBAXIN) 500 MG tablet  Every 6 hours PRN       ? 12/21/21 1456  ?  lidocaine (LIDODERM) 5 %  Every 12 hours       ? 12/21/21 1458  ? ?  ?  ? ?  ? ? ? ?Note:  This document was prepared using Dragon voice recognition software and may include unintentional dictation errors. ?  ?Johnn Hai, PA-C ?12/21/21 1615 ? ?  ?Lucrezia Starch, MD ?12/21/21 1641 ? ?

## 2021-12-21 NOTE — ED Notes (Signed)
Pt was ambulated in hallway with walker with several ED staff helping. Pt being discharged. ?

## 2021-12-21 NOTE — Discharge Instructions (Addendum)
Keep your appointment with DR. Chasnis on Tuesday for your back pain.  Continue with prednisone 2 tablets once a day and also the methocarbamol is every 6 hours if needed for muscle spasms.  Be aware that the methocarbamol could cause drowsiness and increase your risk for falling.  Also a prescription for the Lidoderm patch was sent to the pharmacy to see if this helps temporarily with your back pain.  This will not cause drowsiness and should give you some relief until you are able to see Dr. Sharlet Salina.   ?

## 2021-12-21 NOTE — ED Triage Notes (Addendum)
Presents via EMS from home with back pain  states she developed pain when trying to get up from toilet  hx of sciatica in past  states pain is moving into left leg ? ?

## 2021-12-23 ENCOUNTER — Other Ambulatory Visit: Payer: Self-pay | Admitting: Family Medicine

## 2021-12-23 ENCOUNTER — Telehealth: Payer: Self-pay

## 2021-12-23 DIAGNOSIS — M5416 Radiculopathy, lumbar region: Secondary | ICD-10-CM | POA: Diagnosis not present

## 2021-12-23 DIAGNOSIS — M5136 Other intervertebral disc degeneration, lumbar region: Secondary | ICD-10-CM | POA: Diagnosis not present

## 2021-12-23 NOTE — Progress Notes (Signed)
? ? ?Chronic Care Management ?Pharmacy Assistant  ? ?Name: Amber Avery  MRN: [id number] DOB: 1946-03-22 ? ?Reason for Encounter: Diabetes Disease State Call/ER Follow-up ?  ?Recent office visits:  ?12/17/2021 Steele Sizer, MD (PCP Office Visit) for Follow-up- No medication changes noted, lab orders placed, patient  to follow-up in 4 months ? ?10/07/2021 Steele Sizer, MD (PCP Office Visit) for Mass- No medication changes noted, [place] LT [place] LTD Soft Tissue Non Vascular, no follow-up noted ? ?Recent consult visits:  ?11/07/2021 Adrian Prows, MD (OB-GYN) for Follow-up- No medication changes noted, No orders placed, patient to follow-up in 1 week ? ?10/14/2021 Allene Dillon, NP (Physical Medicine) - No medication changes noted, Order placed for PT, order placed for Left Trochanteric Bursa Injection under Fluoro, patient to follow-up in 10 weeks ? ?10/02/2021 Adrian Prows, MD (OB-GYN)  for Lichen sclerosus of female genitalia- No Medication changes noted, No orders placed, No follow-up noted ? ?09/08/2021 Adrian Prows, MD (OB-GYN) for Lichen sclerosus of female genitalia- No Medication changes noted, No orders placed, patient to follow-up in 3 weeks ? ?Hospital visits:  ?Medication Reconciliation was completed by comparing discharge summary, patient?s EMR and Pharmacy list, and upon discussion with patient. ? ?Admitted to the hospital on 12/21/2021 due to Left-sided low back pain . Discharge date was 12/21/2021. Discharged from [facility] Emergency Department.   ? ?New?Medications Started at [facility] Discharge:?? ?-Started  ?lidocaine 5 % Place 1 patch onto the skin every 12 (twelve) hours. Remove & Discard patch within 12 hours or as directed by MD ?methocarbamol 500 MG tablet Take 1 tablet (500 mg total) by mouth every 6 (six) hours as needed. ?predniSONE 20 MG tablet Take 2 tablet once a day for 5 days ? ?Medication Changes at Hospital Discharge: ?-Changed None  ID ? ?Medications Discontinued at Hospital Discharge: ?-Stopped None ID ? ?Medications that remain the same after Hospital Discharge:??  ?-All other medications will remain the same.   ? ?Medications: ?Outpatient Encounter Medications as of 12/23/2021  ?Medication Sig  ? acetaminophen (TYLENOL) 500 MG tablet Take 1 tablet (500 mg total) by mouth every 6 (six) hours as needed. (Patient taking differently: Take 1,000 mg by mouth every 8 (eight) hours.)  ? albuterol (VENTOLIN HFA) 108 (90 Base) MCG/ACT inhaler Inhale 2 puffs into the lungs every 6 (six) hours as needed for wheezing or shortness of breath.  ? allopurinol (ZYLOPRIM) 300 MG tablet Take 1 tablet (300 mg total) by mouth daily.  ? atorvastatin (LIPITOR) 40 MG tablet Daily for cholesterol  ? benazepril (LOTENSIN) 5 MG tablet Take 1 tablet (5 mg total) by mouth daily.  ? clobetasol ointment (TEMOVATE) 0.05 % Apply to affected area every night for 4 weeks, then every other day for 4 weeks and then twice a week for 4 weeks or until resolution.  ? diclofenac Sodium (VOLTAREN) 1 % GEL APPLY 4 GRAMS TOPICALLY 4 TIMES DAILY AS DIRECTED  ? Dulaglutide (TRULICITY) 1.5 NU/2.7OZ SOPN Inject 1.5 mg into the skin once a week.  ? furosemide (LASIX) 40 MG tablet Take 1 tablet (40 mg total) by mouth daily.  ? gabapentin (NEURONTIN) 300 MG capsule TAKE 1 CAPSULE BY MOUTH IN THE MORNING AND 1 IN THE EVENING AND 2 AT NIGHT  ? glucose blood (ONETOUCH VERIO) test strip USE AS DIRECTED  ? Lancets (ONETOUCH ULTRASOFT) lancets Use as instructed  ? lidocaine (LIDODERM) 5 % Place 1 patch onto the skin every 12 (twelve) hours. Remove & Discard patch within  12 hours or as directed by MD  ? methocarbamol (ROBAXIN) 500 MG tablet Take 1 tablet (500 mg total) by mouth every 6 (six) hours as needed.  ? potassium chloride SA (KLOR-CON M20) 20 MEQ tablet Take 1 tablet (20 mEq total) by mouth daily.  ? predniSONE (DELTASONE) 20 MG tablet Take 2 tablet once a day for 5 days  ? triamcinolone  (KENALOG) 0.1 % Apply topically.  ? triamcinolone ointment (KENALOG) 0.1 % APPLY TOPICALLY TWICE DAILY AS NEEDED  ? vitamin B-12 (CYANOCOBALAMIN) 500 MCG tablet Take 500 mcg by mouth daily.  ? Vitamin D, Cholecalciferol, 25 MCG (1000 UT) TABS Take 1 capsule by mouth daily.  ? ?No facility-administered encounter medications on file as of 12/23/2021.  ? ?Recent Relevant Labs: ?Lab Results  ?Component Value Date/Time  ? HGBA1C 6.6 (H) 12/17/2021 11:39 AM  ? HGBA1C 6.3 (A) 08/15/2021 12:13 PM  ? HGBA1C 6.0 (A) 04/14/2021 11:12 AM  ? HGBA1C 6.0 04/14/2019 10:33 AM  ? HGBA1C 6.3 12/08/2018 11:22 AM  ? MICROALBUR 1.4 12/17/2021 11:39 AM  ? MICROALBUR <0.2 12/13/2020 11:44 AM  ? MICROALBUR 20 04/11/2018 10:53 AM  ? MICROALBUR 20 08/09/2017 09:57 AM  ?  ?Kidney Function ?Lab Results  ?Component Value Date/Time  ? CREATININE 1.28 (H) 12/17/2021 11:39 AM  ? CREATININE 1.18 (H) 12/13/2020 11:44 AM  ? GFRNONAA 46 (L) 12/13/2020 11:44 AM  ? GFRAA 53 (L) 12/13/2020 11:44 AM  ?Care Gaps: ?Diabetic Eye Exam ?Mammogram ?  ?Star Rating Drugs: ?Atorvastatin 40 mg last filled on  11/25/2021 for a 90-Day supply with [facility] ?Benazepril 5 mg last filled on  11/10/2021 for a 90-Day supply with [facility]  ?Trulicity 1.5 mg- Patient receives this medication via Patient Assistance Program with [employer] ? ?Current antihyperglycemic regimen:  ?Trulicity 1.5 mg inject weekly ? ?What recent interventions/DTPs have been made to improve glycemic control:  ?None ID ?Have there been any recent hospitalizations or ED visits since last visit with CPP? Yes ? ?Patient denies hypoglycemic symptoms, including Pale, Sweaty, Shaky, Hungry, Nervous/irritable, and Vision changes ? ?Patient denies hyperglycemic symptoms, including blurry vision, excessive thirst, fatigue, polyuria, and weakness ?How often are you checking your blood sugar? once daily ? ?What are your blood sugars ranging?  ?Patient stated that due to  her recent ER visit she has not checked her numbers in a few days, but she reports her numbers being normal ? ?During the week, how often does your blood glucose drop below 70? Never ?Are you checking your feet daily/regularly? Yes ? ?Adherence Review: ?Is the patient currently on a STATIN medication? Yes ?Is the patient currently on ACE/ARB medication? Yes ?Does the patient have >5 day gap between last estimated fill dates? No ? ?Patient reports that she is doing okay. She reports that she had to go to the ER recently due to lower back pain. 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 She reports that she was hurting on Sunday morning, she got up to go to the bathroom and could not get off of her toilet afterwards due to the pain. She stated she heard her upstairs neighbor in his bathroom and screamed for help. She stated the neighbor actually heard her and contacted her sister who was able to get to her home for assistance. Patient stated she followed up with her Physical Medicine provider today who ordered her to have a MRI done tomorrow.  Patient was started on Prednisone so I educated her to make sure she is checking her blood sugars as this medication can elevate her blood sugar numbers. ? ?Patient verbalized understanding. She has no current concerns or issues. Patient was encouraged to give me a call if she needs anything. ? ?Patient has a telephone appointment with Junius Argyle, CPP on 03/04/2022 @ 1500 ? ?Lynann Bologna, CPA/CMA ?Clinical Pharmacist Assistant ?Phone: [phone]  ? ?  ? ?Lynann Bologna, CPA/CMA ?Clinical Pharmacist Assistant ?Phone: [phone]  ? ? ?

## 2021-12-23 NOTE — Telephone Encounter (Signed)
Copied from [place] [phone] [phone]. Topic: General - Other ?>> Dec 23, 2021 12:15 PM Amber Avery wrote: ?Reason for CRM: Pt called to let Dr. 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

## 2021-12-24 ENCOUNTER — Ambulatory Visit
Admission: RE | Admit: 2021-12-24 | Discharge: 2021-12-24 | Disposition: A | Discharge status: Home / Self Care | Payer: Medicare Other | Source: Ambulatory Visit | Attending: Family Medicine | Admitting: Family Medicine

## 2021-12-24 ENCOUNTER — Other Ambulatory Visit: Payer: Self-pay

## 2021-12-24 DIAGNOSIS — M5416 Radiculopathy, lumbar region: Secondary | ICD-10-CM | POA: Diagnosis present

## 2021-12-24 DIAGNOSIS — M4316 Spondylolisthesis, lumbar region: Secondary | ICD-10-CM | POA: Diagnosis not present

## 2021-12-24 DIAGNOSIS — M47816 Spondylosis without myelopathy or radiculopathy, lumbar region: Secondary | ICD-10-CM | POA: Diagnosis not present

## 2021-12-24 DIAGNOSIS — M5126 Other intervertebral disc displacement, lumbar region: Secondary | ICD-10-CM | POA: Diagnosis not present

## 2021-12-24 DIAGNOSIS — M5137 Other intervertebral disc degeneration, lumbosacral region: Secondary | ICD-10-CM | POA: Diagnosis not present

## 2021-12-30 DIAGNOSIS — M48062 Spinal stenosis, lumbar region with neurogenic claudication: Secondary | ICD-10-CM | POA: Diagnosis not present

## 2021-12-30 DIAGNOSIS — M5136 Other intervertebral disc degeneration, lumbar region: Secondary | ICD-10-CM | POA: Diagnosis not present

## 2021-12-30 DIAGNOSIS — M62830 Muscle spasm of back: Secondary | ICD-10-CM | POA: Diagnosis not present

## 2021-12-30 DIAGNOSIS — M5416 Radiculopathy, lumbar region: Secondary | ICD-10-CM | POA: Diagnosis not present

## 2022-01-07 DIAGNOSIS — M5416 Radiculopathy, lumbar region: Secondary | ICD-10-CM | POA: Diagnosis not present

## 2022-01-07 DIAGNOSIS — M48062 Spinal stenosis, lumbar region with neurogenic claudication: Secondary | ICD-10-CM | POA: Diagnosis not present

## 2022-01-14 DIAGNOSIS — J449 Chronic obstructive pulmonary disease, unspecified: Secondary | ICD-10-CM | POA: Diagnosis not present

## 2022-01-15 NOTE — Progress Notes (Addendum)
Needed to evaluate patient's diabetes status prior to treating her with steroids.  This was the reason for the glucose fingerstick while in the emergency department. ?

## 2022-01-28 DIAGNOSIS — M5416 Radiculopathy, lumbar region: Secondary | ICD-10-CM | POA: Diagnosis not present

## 2022-01-28 DIAGNOSIS — M5136 Other intervertebral disc degeneration, lumbar region: Secondary | ICD-10-CM | POA: Diagnosis not present

## 2022-01-28 DIAGNOSIS — M48062 Spinal stenosis, lumbar region with neurogenic claudication: Secondary | ICD-10-CM | POA: Diagnosis not present

## 2022-02-02 ENCOUNTER — Other Ambulatory Visit: Payer: Self-pay

## 2022-02-02 ENCOUNTER — Telehealth: Payer: Self-pay | Admitting: Family Medicine

## 2022-02-02 DIAGNOSIS — G4733 Obstructive sleep apnea (adult) (pediatric): Secondary | ICD-10-CM

## 2022-02-02 NOTE — Telephone Encounter (Signed)
Completed.

## 2022-02-02 NOTE — Telephone Encounter (Signed)
Copied from [place] [id number]. Topic: General - Other ?>> Feb 02, 2022  1:14 PM Leward Quan A wrote: ?Reason for CRM: Patient called in to inform Dr Ancil Boozer that she need an Rx sent to [place] for a new C PAP machine with face mask and all accessories needed ASAP please asking that it is faxed over to Fax# [phone] Any questions please call patient at Ph# [phone] ?

## 2022-02-04 ENCOUNTER — Telehealth: Payer: Self-pay

## 2022-02-04 NOTE — Progress Notes (Signed)
? ? ?Chronic Care Management ?Pharmacy Assistant  ? ?Name: Amber Avery  MRN: [id number] DOB: [birth year] ? ?Reason for Encounter: Diabetes Disease State Call ?  ?Recent office visits:  ?None ID ? ?Recent consult visits:  ?01/28/2022 Juanda Crumble Chasnis, DO (Physical Medicine) for Follow-up- Started: Tramadol 50 mg 1/2 to 1 tablet twice daily, No orders placed, Patient to follow-up in 6 weeks ? ?12/30/2021 [place], DO (Physical Medicine) Unable to view document ? ?Hospital visits:  ?None in previous 6 months ? ?Medications: ?Outpatient Encounter Medications as of 02/04/2022  ?Medication Sig  ? acetaminophen (TYLENOL) 500 MG tablet Take 1 tablet (500 mg total) by mouth every 6 (six) hours as needed. (Patient taking differently: Take 1,000 mg by mouth every 8 (eight) hours.)  ? albuterol (VENTOLIN HFA) 108 (90 Base) MCG/ACT inhaler Inhale 2 puffs into the lungs every 6 (six) hours as needed for wheezing or shortness of breath.  ? allopurinol (ZYLOPRIM) 300 MG tablet Take 1 tablet (300 mg total) by mouth daily.  ? atorvastatin (LIPITOR) 40 MG tablet Daily for cholesterol  ? benazepril (LOTENSIN) 5 MG tablet Take 1 tablet (5 mg total) by mouth daily.  ? clobetasol ointment (TEMOVATE) 0.05 % Apply to affected area every night for 4 weeks, then every other day for 4 weeks and then twice a week for 4 weeks or until resolution.  ? diclofenac Sodium (VOLTAREN) 1 % GEL APPLY 4 GRAMS TOPICALLY 4 TIMES DAILY AS DIRECTED  ? Dulaglutide (TRULICITY) 1.5 YI/5.0YD SOPN Inject 1.5 mg into the skin once a week.  ? furosemide (LASIX) 40 MG tablet Take 1 tablet (40 mg total) by mouth daily.  ? gabapentin (NEURONTIN) 300 MG capsule TAKE 1 CAPSULE BY MOUTH IN THE MORNING AND 1 IN THE EVENING AND 2 AT NIGHT  ? glucose blood (ONETOUCH VERIO) test strip USE AS DIRECTED  ? Lancets (ONETOUCH ULTRASOFT) lancets Use as instructed  ? lidocaine (LIDODERM) 5 % Place 1 patch onto the skin every 12 (twelve) hours. Remove &  Discard patch within 12 hours or as directed by MD  ? methocarbamol (ROBAXIN) 500 MG tablet Take 1 tablet (500 mg total) by mouth every 6 (six) hours as needed.  ? potassium chloride SA (KLOR-CON M20) 20 MEQ tablet Take 1 tablet (20 mEq total) by mouth daily.  ? predniSONE (DELTASONE) 20 MG tablet Take 2 tablet once a day for 5 days  ? triamcinolone (KENALOG) 0.1 % Apply topically.  ? triamcinolone ointment (KENALOG) 0.1 % APPLY TOPICALLY TWICE DAILY AS NEEDED  ? vitamin B-12 (CYANOCOBALAMIN) 500 MCG tablet Take 500 mcg by mouth daily.  ? Vitamin D, Cholecalciferol, 25 MCG (1000 UT) TABS Take 1 capsule by mouth daily.  ? ?No facility-administered encounter medications on file as of 02/04/2022.  ? ?Care Gaps: ?Diabetic Eye Exam ?Mammogram ? ?Star Rating Drugs: ?Atorvastatin 40 mg last filled on  11/25/2021 for a 90-Day supply with [facility] ?Benazepril 5 mg last filled on  11/10/2021 for a 90-Day supply with [facility]  ?Trulicity 1.5 mg- Patient receives this medication via Patient Assistance Program with [employer] ? ?SUBJECTIVE:Recent Relevant Labs: ?Lab Results  ?Component Value Date/Time  ? HGBA1C 6.6 (H) 12/17/2021 11:39 AM  ? HGBA1C 6.3 (A) 08/15/2021 12:13 PM  ? HGBA1C 6.0 (A) 04/14/2021 11:12 AM  ? HGBA1C 6.0 04/14/2019 10:33 AM  ? HGBA1C 6.3 12/08/2018 11:22 AM  ? MICROALBUR 1.4 12/17/2021 11:39 AM  ? MICROALBUR <0.2 12/13/2020 11:44 AM  ?  MICROALBUR 20 04/11/2018 10:53 AM  ? MICROALBUR 20 08/09/2017 09:57 AM  ?  ?Kidney Function ?Lab Results  ?Component Value Date/Time  ? CREATININE 1.28 (H) 12/17/2021 11:39 AM  ? CREATININE 1.18 (H) 12/13/2020 11:44 AM  ? GFRNONAA 46 (L) 12/13/2020 11:44 AM  ? GFRAA 53 (L) 12/13/2020 11:44 AM  ? ?Current antihyperglycemic regimen:  ?Trulicity 1.5 mg inject weekly ? ?What recent interventions/DTPs have been made to improve glycemic control:  ?None ID ? ?Have there been any recent hospitalizations or ED visits since last visit with CPP?  No ? ?Patient denies hypoglycemic symptoms, including Pale, Sweaty, Shaky, Hungry, Nervous/irritable, and Vision changes ?Patient denies hyperglycemic symptoms, including blurry vision, excessive thirst, fatigue, polyuria, and weakness ?How often are you checking your blood sugar? Patient does not check daily ? ?What are your blood sugars ranging?  ?Fasting: 04/19 126 ? ?During the week, how often does your blood glucose drop below 70? Never ?Are you checking your feet daily/regularly? Yes ? ?Adherence Review: ?Is the patient currently on a STATIN medication? Yes ?Is the patient currently on ACE/ARB medication? No ?Does the patient have >5 day gap between last estimated fill dates? No ? ?I spoke to the patient and she reports that she is doing much better. Patient does state that her ankles and feet are swollen as of yesterday. She reports that she did get a little too hot yesterday, and thinks that may have caused her swelling. She reports that this morning they are still swollen, but denies any pain. Patient reports that she is taking her Furosemide, and she has now put on her air conditioner on. ? ?Patient also stated she is almost out of her Trulicity so I advised her that I would give them a call to reorder for her. She also has a new address so she would like me to make sure they have her updated address as well.  ? ?Patient stated she has not started the Tramadol at this time because they only sent her a 7 day supply. Patient was educated that since the Tramadol is a controlled substance the rule for the insurance that when it's a new prescription they will only authorize a 7-Day supply to start with. I encouraged her to go ahead an take the medication as needed and when she is down to 3-Days of medication she should give her provider a call if needed.  Patient verbalized understanding. ? ?Patient stated that since her move and the issues with her back she is now having to use her walker daily. She reports  that she does have a call button in case of an emergency. Patient denies any falls, and she stated she is having a safety bar installed in her bathroom. ? ?Patient has no other concerns or issues at this time. I informed the patient that if there are any issues with ordering her Trulicity I would give her a call back. ? ?I contacted [employer] and I was able to update the patients address with the pharmacy, and order her a refill of her Trulicity. The representative advised they would get her order prepared for shipment to her new address. ? ?Patient has a telephone appointment with Junius Argyle, CPP on 03/04/2022 '@1500'$  ? ?Lynann Bologna, CPA/CMA ?Clinical Pharmacist Assistant ?Phone: [phone]  ? ?

## 2022-02-14 DIAGNOSIS — J449 Chronic obstructive pulmonary disease, unspecified: Secondary | ICD-10-CM | POA: Diagnosis not present

## 2022-02-16 NOTE — Telephone Encounter (Signed)
Spoke to [place] and she is going to call [place] back ?

## 2022-02-16 NOTE — Telephone Encounter (Signed)
Amber Avery with [employer] calling to ask WHY does pt need a new Cpap when she already has one? ?Pt has sleep study done 08/24/2018. ? ?Cb [phone] ? ?[place] called the pt but no answer. ?

## 2022-03-04 ENCOUNTER — Ambulatory Visit (INDEPENDENT_AMBULATORY_CARE_PROVIDER_SITE_OTHER): Payer: Medicare Other

## 2022-03-04 DIAGNOSIS — E785 Hyperlipidemia, unspecified: Secondary | ICD-10-CM

## 2022-03-04 DIAGNOSIS — E1122 Type 2 diabetes mellitus with diabetic chronic kidney disease: Secondary | ICD-10-CM

## 2022-03-04 NOTE — Progress Notes (Signed)
Chronic Care Management Pharmacy Note  03/09/2022 Name:  Amber Avery MRN:  [id number] DOB:  December 14, 1945  Summary: Patient presents for CCM follow-up.   Recommendations/Changes made from today's visit: Continue Current Medications  Plan: CPP Follow-up in 6 months   Subjective: Amber Avery is an 76 y.o. year old female who is a primary patient of Steele Sizer, MD.  The CCM team was consulted for assistance with disease management and care coordination needs.    Engaged with patient by telephone for follow up visit in response to provider referral for pharmacy case management and/or care coordination services.   Consent to Services:  The patient was given information about Chronic Care Management services, agreed to services, and gave verbal consent prior to initiation of services.  Please see initial visit note for detailed documentation.   Patient Care Team: Steele Sizer, MD as PCP - General (Family Medicine) Bryson Ha, OD as Consulting Physician (Optometry) Sharlet Salina, MD as Consulting Physician (Physical Medicine and Rehabilitation) Germaine Pomfret, [facility] as Pharmacist (Pharmacist)  Recent office visits: 12/17/21: Patient presented to Dr. Ancil Boozer for follow-up.   Recent consult visits: 01/28/22: Patient presented to Allene Dillon, NP (Physical Medicine)  Hospital visits: 12/21/21: Patient presented to ED for back pain.   Objective:  Lab Results  Component Value Date   CREATININE 1.28 (H) 12/17/2021   BUN 29 (H) 12/17/2021   GFRNONAA 46 (L) 12/13/2020   GFRAA 53 (L) 12/13/2020   NA 140 12/17/2021   K 4.9 12/17/2021   CALCIUM 10.0 12/17/2021   CO2 30 12/17/2021   GLUCOSE 115 (H) 12/17/2021    Lab Results  Component Value Date/Time   HGBA1C 6.6 (H) 12/17/2021 11:39 AM   HGBA1C 6.3 (A) 08/15/2021 12:13 PM   HGBA1C 6.0 (A) 04/14/2021 11:12 AM   HGBA1C 6.0 04/14/2019 10:33 AM   HGBA1C 6.3 12/08/2018 11:22 AM   MICROALBUR 1.4 12/17/2021  11:39 AM   MICROALBUR <0.2 12/13/2020 11:44 AM   MICROALBUR 20 04/11/2018 10:53 AM   MICROALBUR 20 08/09/2017 09:57 AM    Last diabetic Eye exam:  Lab Results  Component Value Date/Time   HMDIABEYEEXA No Retinopathy 06/11/2020 12:00 AM    Last diabetic Foot exam: No results found for: HMDIABFOOTEX   Lab Results  Component Value Date   CHOL 158 12/17/2021   HDL 53 12/17/2021   LDLCALC 83 12/17/2021   TRIG 128 12/17/2021   CHOLHDL 3.0 12/17/2021       Latest Ref Rng & Units 12/17/2021   11:39 AM 12/13/2020   11:44 AM 12/14/2019   10:49 AM  Hepatic Function  Total Protein 6.1 - 8.1 g/dL 6.6   7.1   6.6    AST 10 - 35 U/L 24   34   22    ALT 6 - 29 U/L '19   28   22    '$ Total Bilirubin 0.2 - 1.2 mg/dL 0.7   0.7   0.7      Lab Results  Component Value Date/Time   TSH 4.400 12/09/2015 10:10 AM   TSH 4.380 09/18/2015 11:39 AM       Latest Ref Rng & Units 12/17/2021   11:39 AM 12/13/2020   11:44 AM 12/14/2019   10:49 AM  CBC  WBC 3.8 - 10.8 Thousand/uL 6.9   6.1   6.7    Hemoglobin 11.7 - 15.5 g/dL 12.7   13.0   12.3    Hematocrit 35.0 - 45.0 % 38.0  38.9   36.7    Platelets 140 - 400 Thousand/uL 220   208   177      Lab Results  Component Value Date/Time   VD25OH 37 12/17/2021 11:39 AM   VD25OH 39 12/13/2020 11:44 AM    Clinical ASCVD: No  The 10-year ASCVD risk score (Arnett DK, et al., 2019) is: 38.7%   Values used to calculate the score:     Age: 35 years     Sex: Female     Is Non-Hispanic African American: No     Diabetic: Yes     Tobacco smoker: No     Systolic Blood Pressure: 185 mmHg     Is BP treated: Yes     HDL Cholesterol: 53 mg/dL     Total Cholesterol: 158 mg/dL       12/17/2021   10:48 AM 10/07/2021    9:57 AM 09/03/2021    2:38 PM  Depression screen PHQ 2/9  Decreased Interest 0 0 0  Down, Depressed, Hopeless 0 0 2  PHQ - 2 Score 0 0 2  Altered sleeping 0 0 1  Tired, decreased energy 0 0 0  Change in appetite 0 0 0  Feeling bad or  failure about yourself  0 0 0  Trouble concentrating 0 0 0  Moving slowly or fidgety/restless 0 0 0  Suicidal thoughts 0 0 0  PHQ-9 Score 0 0 3  Difficult doing work/chores Not difficult at all Not difficult at all     Social History   Tobacco Use  Smoking Status Former   Packs/day: 2.00   Years: 15.00   Pack years: 30.00   Types: Cigarettes   Quit date: 1983   Years since quitting: 40.4  Smokeless Tobacco Never  Tobacco Comments   smoking cessation materials not required   BP Readings from Last 3 Encounters:  12/21/21 132/70  12/17/21 124/70  11/07/21 120/74   Pulse Readings from Last 3 Encounters:  12/21/21 88  12/17/21 78  10/07/21 84   Wt Readings from Last 3 Encounters:  12/21/21 258 lb 6.1 oz (117.2 kg)  12/17/21 258 lb 6.4 oz (117.2 kg)  11/07/21 254 lb 6.4 oz (115.4 kg)   BMI Readings from Last 3 Encounters:  12/21/21 47.26 kg/m  12/17/21 47.26 kg/m  11/07/21 46.53 kg/m    Assessment/Interventions: Review of patient past medical history, allergies, medications, health status, including review of consultants reports, laboratory and other test data, was performed as part of comprehensive evaluation and provision of chronic care management services.   SDOH:  (Social Determinants of Health) assessments and interventions performed: Yes  SDOH Screenings   Alcohol Screen: Low Risk    Last Alcohol Screening Score (AUDIT): 0  Depression (PHQ2-9): Low Risk    PHQ-2 Score: 0  Financial Resource Strain: Low Risk    Difficulty of Paying Living Expenses: Not hard at all  Food Insecurity: No Food Insecurity   Worried About Charity fundraiser in the Last Year: Never true   Ran Out of Food in the Last Year: Never true  Housing: Low Risk    Last Housing Risk Score: 0  Physical Activity: Inactive   Days of Exercise per Week: 0 days   Minutes of Exercise per Session: 0 min  Social Connections: Socially Isolated   Frequency of Communication with Friends and  Family: More than three times a week   Frequency of Social Gatherings with Friends and Family: More than three times a  week   Attends Religious Services: Never   Active Member of Clubs or Organizations: No   Attends Archivist Meetings: Never   Marital Status: Divorced  Stress: No Stress Concern Present   Feeling of Stress : Only a little  Tobacco Use: Medium Risk   Smoking Tobacco Use: Former   Smokeless Tobacco Use: Never   Passive Exposure: Not on file  Transportation Needs: No Transportation Needs   Lack of Transportation (Medical): No   Lack of Transportation (Non-Medical): No    CCM Care Plan  Allergies  Allergen Reactions   Codeine Hives and Nausea And Vomiting    Medications Reviewed Today     Reviewed by Homero Fellers, MD (Physician) on 01/29/22 at 1207  Med List Status: <None>   Medication Order Taking? Sig Documenting Provider Last Dose Status Informant  acetaminophen (TYLENOL) 500 MG tablet [id number] Yes Take 1 tablet (500 mg total) by mouth every 6 (six) hours as needed.  Patient taking differently: Take 1,000 mg by mouth every 8 (eight) hours.   Steele Sizer, MD Taking Active   albuterol (VENTOLIN HFA) 108 (90 Base) MCG/ACT inhaler [id number] Yes Inhale 2 puffs into the lungs every 6 (six) hours as needed for wheezing or shortness of breath. Steele Sizer, MD Taking Active   allopurinol (ZYLOPRIM) 300 MG tablet [id number] Yes Take 1 tablet (300 mg total) by mouth daily. Steele Sizer, MD Taking Active   atorvastatin (LIPITOR) 40 MG tablet [id number] Yes Daily for cholesterol Steele Sizer, MD Taking Active   benazepril (LOTENSIN) 5 MG tablet [id number] Yes Take 1 tablet (5 mg total) by mouth daily. Steele Sizer, MD Taking Active   clobetasol ointment (TEMOVATE) 0.05 % [id number]  Apply to affected area every night for 4 weeks, then every other day for 4 weeks and then twice a week for 4 weeks or until resolution. Homero Fellers, MD   Active   diclofenac Sodium (VOLTAREN) 1 % GEL [id number] Yes APPLY 4 GRAMS TOPICALLY 4 TIMES DAILY AS DIRECTED Sowles, Drue Stager, MD Taking Active   Dulaglutide (TRULICITY) 1.5 TG/6.2IR SOPN [id number] Yes Inject 1.5 mg into the skin once a week. Steele Sizer, MD Taking Active   furosemide (LASIX) 40 MG tablet [id number] Yes Take 1 tablet (40 mg total) by mouth daily. Steele Sizer, MD Taking Active   gabapentin (NEURONTIN) 300 MG capsule [id number] Yes TAKE 1 CAPSULE BY MOUTH IN THE MORNING AND 1 IN THE EVENING AND 2 AT Tonny Bollman, MD Taking Active   glucose blood [facility] VERIO) test strip [id number] Yes USE AS DIRECTED Steele Sizer, MD Taking Active   Lancets [facility] ULTRASOFT) lancets [id number] Yes Use as instructed Steele Sizer, MD Taking Active   lidocaine (LIDODERM) 5 % [id number]  Place 1 patch onto the skin every 12 (twelve) hours. Remove & Discard patch within 12 hours or as directed by MD Johnn Hai, PA-C  Active   methocarbamol (ROBAXIN) 500 MG tablet [id number]  Take 1 tablet (500 mg total) by mouth every 6 (six) hours as needed. Johnn Hai, PA-C  Active   potassium chloride SA (KLOR-CON M20) 20 MEQ tablet [id number] Yes Take 1 tablet (20 mEq total) by mouth daily. Steele Sizer, MD Taking Active   predniSONE (DELTASONE) 20 MG tablet [id number]  Take 2 tablet once a day for 5 days Johnn Hai, PA-C  Active   triamcinolone (KENALOG) 0.1 % [id number] Yes Apply topically. [provider] Taking Active   triamcinolone ointment (KENALOG) 0.1 %  [id number]  APPLY TOPICALLY TWICE DAILY AS NEEDED Steele Sizer, MD  Active   vitamin B-12 (CYANOCOBALAMIN) 500 MCG tablet [id number] Yes Take 500 mcg by mouth daily. [provider] Taking Active   Vitamin D, Cholecalciferol, 25 MCG (1000 UT) TABS [id number] Yes Take 1 capsule by mouth daily. Steele Sizer, MD Taking Active             Patient Active Problem List   Diagnosis Date Noted    Gait instability 12/13/2020   Stage 3a chronic kidney disease ([place]) 12/13/2020   Cellulitis of right ankle 04/12/2020   Morbid obesity with BMI of 45.0-49.9, adult ([place]) 02/06/2020   History of gastritis 08/14/2019   Angiodysplasia of stomach and duodenum    Iron deficiency anemia 04/20/2018   Anemia, unspecified 75/64/3329   Lichen sclerosus of female genitalia 06/29/2016   Primary osteoarthritis of both knees 04/07/2016   Special screening for malignant neoplasms, colon    Right thyroid nodule 06/26/2015   Asthma, intermittent 04/03/2015   Benign essential HTN 04/03/2015   Carpal tunnel syndrome 04/03/2015   Chronic kidney disease (CKD), stage II (mild) 04/03/2015   Controlled gout 04/03/2015   Arteriosclerosis of coronary artery 04/03/2015   Diabetes mellitus with renal manifestations, controlled ([place]) 04/03/2015   Dyslipidemia 04/03/2015   Edema extremities 04/03/2015   Elevated sedimentation rate 04/03/2015   Knee pain 04/03/2015   Lumbar radiculitis 04/03/2015   Obstructive apnea 04/03/2015   Lumbosacral spondylosis 04/03/2015   Osteoarthritis, chronic 04/03/2015   Psoriasis 04/03/2015   Vitamin D deficiency 04/03/2015   Varicose veins 04/03/2015   Goiter 12/23/2014   Degeneration of intervertebral disc of lumbar region 08/23/2014   Corns and callosity 06/11/2009    Immunization History  Administered Date(s) Administered   Fluad Quad(high Dose 65+) 06/20/2020, 06/24/2021   Influenza, High Dose Seasonal PF 06/19/2015, 06/29/2016, 06/14/2017, 06/17/2018, 06/22/2019   Influenza-Unspecified 06/17/2011, 07/11/2019   PFIZER(Purple Top)SARS-COV-2 Vaccination 12/11/2019, 01/01/2020, 07/16/2020, 02/03/2021, 07/03/2021   Pneumococcal Conjugate-13 11/24/2011, 11/23/2013, 04/09/2017   Pneumococcal Polysaccharide-23 08/30/2009, 08/30/2009   Td 08/30/2009   Tdap 08/30/2009, 08/12/2020   Zoster Recombinat (Shingrix) 07/11/2019, 09/21/2019   Zoster, Live 02/12/2011     Conditions to be addressed/monitored:  Hypertension, Hyperlipidemia, Diabetes, Asthma, Chronic Kidney Disease, Osteoarthritis, Gout, and Chronic Pain  Care Plan : General Pharmacy (Adult)  Updates made by Germaine Pomfret, RPH since 03/09/2022 12:00 AM     Problem: Hypertension, Hyperlipidemia, Diabetes, Asthma, Chronic Kidney Disease, Osteoarthritis, Gout, and Chronic Pain   Priority: High     Long-Range Goal: Patient-Specific Goal   Start Date: 04/03/2021  Expected End Date: 09/17/2022  This Visit's Progress: On track  Recent Progress: On track  Priority: High  Note:   Current Barriers:  Unable to independently afford treatment regimen  Pharmacist Clinical Goal(s):  Patient will verbalize ability to afford treatment regimen maintain control of diabetes as evidenced by A1c less than 8%  through collaboration with PharmD and provider.   Interventions: 1:1 collaboration with Steele Sizer, MD regarding development and update of comprehensive plan of care as evidenced by provider attestation and co-signature Inter-disciplinary care team collaboration (see longitudinal plan of care) Comprehensive medication review performed; medication list updated in electronic medical record  Hypertension (BP goal <140/90) -Controlled -Current treatment: Furosemide 40 mg daily  Benazepril 5 mg daily  -Medications previously tried: NA  -Current home readings: has not been monitoring regularly  -Denies hypotensive/hypertensive symptoms -Counseled to monitor BP at home weekly, document, and provide log at future appointments -  Recommended to continue current medication  Hyperlipidemia: (LDL goal < 100) -Controlled -Current treatment: Atorvastatin 40 mg daily: Appropriate, Effective, Safe, Accessible   -Medications previously tried: NA  -Recommended to continue current medication  Diabetes (A1c goal <8%) -Controlled -Current medications: Trulicity 1.5 mg weekly: Appropriate,  Effective, Safe, Accessible  -Medications previously tried: NA  -Current home glucose readings fasting glucose: 125 post prandial glucose: NA -Denies hypoglycemic/hyperglycemic symptoms -Recommended to continue current medication  Asthma (Goal: control symptoms and prevent exacerbations) -Controlled -Current treatment  Ventolin HFA  Albuterol Nebulizer twice daily  Budesonide nebulizer twice daily  -Medications previously tried: NA  -Exacerbations requiring treatment in last 6 months: NA -Patient reports consistent use of maintenance inhaler -Frequency of rescue inhaler use: Sporadic, varies based on activity -Counseled on Benefits of consistent maintenance inhaler use -Recommended to continue current medication  Chronic Pain (Goal: Maintain adequate pain control) -Not ideally controlled -Current treatment  Diclofenac 1 % gel three times daily  Gabapentin 500 mg 1 tablet AM, 1 PM, 2 HS  Acetaminophen 500 mg every 6 hours as needed  -Medications previously tried: NA -Patient with worsening hip pain recently. Tries to limit gabapentin when possible.  -Counseled to limit APAP to 3000 mg daily   -Recommended to continue current medication  Chronic Kidney Disease Stage 3a  -All medications assessed for renal dosing and appropriateness in chronic kidney disease. -Recommended to continue current medication  Patient Goals/Self-Care Activities Patient will:  - check glucose daily, document, and provide at future appointments  Follow Up Plan: Telephone follow up appointment with care management team member scheduled for:  08/19/2022 at 3:00 PM    Medication Assistance:  Trulicity obtained through [employer] medication assistance program.  Enrollment ends Dec 2023  Compliance/Adherence/Medication fill history: Care Gaps: Covid Booster  Star-Rating Drugs: Atorvastatin 40 mg last filled on  11/25/2021 for a 90-Day supply with [facility] Benazepril 5 mg last filled  on  01/26/2022 for a 90-Day supply with [facility]   Patient's preferred pharmacy is:  [facility] [address]), [place] - [place]) [place] [address] Phone: [phone] Fax: [phone]  [facility] ([place]) [place], [place] [address] Phone: [phone] Fax: [phone]   Uses pill box? Yes Pt endorses 100% compliance  We discussed: Current pharmacy is preferred with insurance plan and patient is satisfied with pharmacy services Patient decided to: Continue current medication management strategy  Care Plan and Follow Up Patient Decision:  Patient agrees to Care Plan and Follow-up.  Plan: Telephone follow up appointment with care management team member scheduled for:  08/19/2022 at 3:00 PM  Malva Limes, [place] Pharmacist Practitioner  [facility] [phone]

## 2022-03-09 NOTE — Patient Instructions (Signed)
Visit Information It was great speaking with you today!  Please let me know if you have any questions about our visit.   Goals Addressed   None     Patient Care Plan: General Pharmacy (Adult)     Problem Identified: Hypertension, Hyperlipidemia, Diabetes, Asthma, Chronic Kidney Disease, Osteoarthritis, Gout, and Chronic Pain   Priority: High     Long-Range Goal: Patient-Specific Goal   Start Date: 04/03/2021  Expected End Date: 09/17/2022  This Visit's Progress: On track  Recent Progress: On track  Priority: High  Note:   Current Barriers:  Unable to independently afford treatment regimen  Pharmacist Clinical Goal(s):  Patient will verbalize ability to afford treatment regimen maintain control of diabetes as evidenced by A1c less than 8%  through collaboration with PharmD and provider.   Interventions: 1:1 collaboration with Steele Sizer, MD regarding development and update of comprehensive plan of care as evidenced by provider attestation and co-signature Inter-disciplinary care team collaboration (see longitudinal plan of care) Comprehensive medication review performed; medication list updated in electronic medical record  Hypertension (BP goal <140/90) -Controlled -Current treatment: Furosemide 40 mg daily  Benazepril 5 mg daily  -Medications previously tried: NA  -Current home readings: has not been monitoring regularly  -Denies hypotensive/hypertensive symptoms -Counseled to monitor BP at home weekly, document, and provide log at future appointments -Recommended to continue current medication  Hyperlipidemia: (LDL goal < 100) -Controlled -Current treatment: Atorvastatin 40 mg daily: Appropriate, Effective, Safe, Accessible   -Medications previously tried: NA  -Recommended to continue current medication  Diabetes (A1c goal <8%) -Controlled -Current medications: Trulicity 1.5 mg weekly: Appropriate, Effective, Safe, Accessible  -Medications previously  tried: NA  -Current home glucose readings fasting glucose: 125 post prandial glucose: NA -Denies hypoglycemic/hyperglycemic symptoms -Recommended to continue current medication  Asthma (Goal: control symptoms and prevent exacerbations) -Controlled -Current treatment  Ventolin HFA  Albuterol Nebulizer twice daily  Budesonide nebulizer twice daily  -Medications previously tried: NA  -Exacerbations requiring treatment in last 6 months: NA -Patient reports consistent use of maintenance inhaler -Frequency of rescue inhaler use: Sporadic, varies based on activity -Counseled on Benefits of consistent maintenance inhaler use -Recommended to continue current medication  Chronic Pain (Goal: Maintain adequate pain control) -Not ideally controlled -Current treatment  Diclofenac 1 % gel three times daily  Gabapentin 500 mg 1 tablet AM, 1 PM, 2 HS  Acetaminophen 500 mg every 6 hours as needed  -Medications previously tried: NA -Patient with worsening hip pain recently. Tries to limit gabapentin when possible.  -Counseled to limit APAP to 3000 mg daily   -Recommended to continue current medication  Chronic Kidney Disease Stage 3a  -All medications assessed for renal dosing and appropriateness in chronic kidney disease. -Recommended to continue current medication  Patient Goals/Self-Care Activities Patient will:  - check glucose daily, document, and provide at future appointments  Follow Up Plan: Telephone follow up appointment with care management team member scheduled for:  08/19/2022 at 3:00 PM      Patient agreed to services and verbal consent obtained.   Patient verbalizes understanding of instructions and care plan provided today and agrees to view in [place]. Active MyChart status and patient understanding of how to access instructions and care plan via MyChart confirmed with patient.     Malva Limes, [place] Pharmacist Practitioner  [facility] [phone]

## 2022-03-13 DIAGNOSIS — F5101 Primary insomnia: Secondary | ICD-10-CM | POA: Diagnosis not present

## 2022-03-13 DIAGNOSIS — I10 Essential (primary) hypertension: Secondary | ICD-10-CM | POA: Diagnosis not present

## 2022-03-13 DIAGNOSIS — Z79899 Other long term (current) drug therapy: Secondary | ICD-10-CM | POA: Diagnosis not present

## 2022-03-13 DIAGNOSIS — G4733 Obstructive sleep apnea (adult) (pediatric): Secondary | ICD-10-CM | POA: Diagnosis not present

## 2022-03-13 DIAGNOSIS — M5136 Other intervertebral disc degeneration, lumbar region: Secondary | ICD-10-CM | POA: Diagnosis not present

## 2022-03-13 DIAGNOSIS — M5416 Radiculopathy, lumbar region: Secondary | ICD-10-CM | POA: Diagnosis not present

## 2022-03-16 DIAGNOSIS — J449 Chronic obstructive pulmonary disease, unspecified: Secondary | ICD-10-CM | POA: Diagnosis not present

## 2022-03-18 ENCOUNTER — Other Ambulatory Visit: Payer: Self-pay | Admitting: Family Medicine

## 2022-03-18 DIAGNOSIS — I129 Hypertensive chronic kidney disease with stage 1 through stage 4 chronic kidney disease, or unspecified chronic kidney disease: Secondary | ICD-10-CM

## 2022-03-18 DIAGNOSIS — Z794 Long term (current) use of insulin: Secondary | ICD-10-CM | POA: Diagnosis not present

## 2022-03-18 DIAGNOSIS — E1122 Type 2 diabetes mellitus with diabetic chronic kidney disease: Secondary | ICD-10-CM | POA: Diagnosis not present

## 2022-03-18 DIAGNOSIS — N1831 Chronic kidney disease, stage 3a: Secondary | ICD-10-CM | POA: Diagnosis not present

## 2022-03-18 DIAGNOSIS — E785 Hyperlipidemia, unspecified: Secondary | ICD-10-CM

## 2022-03-18 DIAGNOSIS — L409 Psoriasis, unspecified: Secondary | ICD-10-CM

## 2022-03-18 DIAGNOSIS — Z7984 Long term (current) use of oral hypoglycemic drugs: Secondary | ICD-10-CM

## 2022-03-18 DIAGNOSIS — Z87891 Personal history of nicotine dependence: Secondary | ICD-10-CM | POA: Diagnosis not present

## 2022-03-18 DIAGNOSIS — J45909 Unspecified asthma, uncomplicated: Secondary | ICD-10-CM | POA: Diagnosis not present

## 2022-03-19 ENCOUNTER — Other Ambulatory Visit: Payer: Self-pay

## 2022-03-19 DIAGNOSIS — L409 Psoriasis, unspecified: Secondary | ICD-10-CM

## 2022-04-06 DIAGNOSIS — F5101 Primary insomnia: Secondary | ICD-10-CM | POA: Diagnosis not present

## 2022-04-06 DIAGNOSIS — I10 Essential (primary) hypertension: Secondary | ICD-10-CM | POA: Diagnosis not present

## 2022-04-06 DIAGNOSIS — G4733 Obstructive sleep apnea (adult) (pediatric): Secondary | ICD-10-CM | POA: Diagnosis not present

## 2022-04-13 ENCOUNTER — Other Ambulatory Visit: Payer: Self-pay | Admitting: Family Medicine

## 2022-04-13 DIAGNOSIS — M109 Gout, unspecified: Secondary | ICD-10-CM

## 2022-04-13 DIAGNOSIS — N1831 Chronic kidney disease, stage 3a: Secondary | ICD-10-CM

## 2022-04-13 DIAGNOSIS — M5416 Radiculopathy, lumbar region: Secondary | ICD-10-CM

## 2022-04-13 DIAGNOSIS — I10 Essential (primary) hypertension: Secondary | ICD-10-CM

## 2022-04-13 DIAGNOSIS — R600 Localized edema: Secondary | ICD-10-CM

## 2022-04-16 DIAGNOSIS — J449 Chronic obstructive pulmonary disease, unspecified: Secondary | ICD-10-CM | POA: Diagnosis not present

## 2022-04-20 NOTE — Progress Notes (Signed)
Name: Amber Avery   MRN: [id number]    DOB: January 25, 1946   Date:04/22/2022       Progress Note  Subjective  Chief Complaint  Follow Up  HPI  HTN:   She is now on benazepril 5 mg,. No chest pain, palpitation, dizziness  or change in exercise tolerance   Dyslipidemia: taking statin therapy, no side effects, no chest pain or myalgia. Last LDL was up from 60 to 83, she states only taking every other day because she was having cramps. Explained goal is below 70, she will try cutting in half and take every day    Asthma Mild intermittent: she states she has been doing well, no wheezing, cough , mild SOB with activity but likely multifactorial. Stable.   DMII: . She is on Trulicity, no polyphagia, polyuria (stable because of diuretic) denies  polydipsia . She has dyslipidemia but last LDL was not at goal, she told me not as compliant with statin therapy, CKI stage III she is on low dose ACE, also has neuropathy that is stable on gabapentin.   Morbid obesity: BMI over 40 , weight is up again, she has OA, back pain, discussed importance of weight loss     OSA: she wear machine every night, she uses oxygen with CPAP and is compliant, she brought a print out, doing well .     OA : she saw Ortho , Dr. Candelaria Stagers, and he advised her not to have surgery because of her weight, she had one round of hyaluronic injection without help and it was too expensive . She is still taking Tylenol She still has intermittent right knee effusion when more active, no redness or increase in warmth. She has a hoveround, uses walker and sometimes cane. Pain level is from 3 to 9/10 depending on activity level     Iron deficiency anemia: last level was better, under the care of Dr. Tasia Catchings and had 5 iron infusions in the past but CBC has been back to normal, last checked 03/23  last visit with GI was with Dr. Allen Norris - GI in 2020       Senile purpura: stable , reassurance given    DDD lumbar spine and radiculitis: seen by Psyatrist ,  seeing Whitney FNP, she has MRI lumbar spine, steroid injection for trochanteric bursitis did not work, she also had PT . She is still taking Gabapentin and takes Tramadol every night but only prn during the day when the pain is intense   B12 deficiency: taking supplements   Controlled gout: taking allopurinol   Patient Active Problem List   Diagnosis Date Noted   Gait instability 12/13/2020   Stage 3a chronic kidney disease ([place]) 12/13/2020   Cellulitis of right ankle 04/12/2020   Morbid obesity with BMI of 45.0-49.9, adult ([place]) 02/06/2020   History of gastritis 08/14/2019   Angiodysplasia of stomach and duodenum    Iron deficiency anemia 04/20/2018   Anemia, unspecified 40/07/2724   Lichen sclerosus of female genitalia 06/29/2016   Primary osteoarthritis of both knees 04/07/2016   Special screening for malignant neoplasms, colon    Right thyroid nodule 06/26/2015   Asthma, intermittent 04/03/2015   Benign essential HTN 04/03/2015   Carpal tunnel syndrome 04/03/2015   Chronic kidney disease (CKD), stage II (mild) 04/03/2015   Controlled gout 04/03/2015   Arteriosclerosis of coronary artery 04/03/2015   Diabetes mellitus with renal manifestations, controlled ([place]) 04/03/2015   Dyslipidemia 04/03/2015   Edema extremities 04/03/2015   Elevated  sedimentation rate 04/03/2015   Knee pain 04/03/2015   Lumbar radiculitis 04/03/2015   Obstructive apnea 04/03/2015   Lumbosacral spondylosis 04/03/2015   Osteoarthritis, chronic 04/03/2015   Psoriasis 04/03/2015   Vitamin D deficiency 04/03/2015   Varicose veins 04/03/2015   Goiter 12/23/2014   Degeneration of intervertebral disc of lumbar region 08/23/2014   Corns and callosity 06/11/2009    Past Surgical History:  Procedure Laterality Date   APPENDECTOMY     CATARACT EXTRACTION W/PHACO Left 08/14/2020   Procedure: CATARACT EXTRACTION PHACO AND INTRAOCULAR LENS PLACEMENT ([facility]) LEFT DIABETIC 3.56 00:59.6 6.0%;  Surgeon:  Leandrew Koyanagi, MD;  Location: [place];  Service: Ophthalmology;  Laterality: Left;  Diabetic   CATARACT EXTRACTION W/PHACO Right 09/04/2020   Procedure: CATARACT EXTRACTION PHACO AND INTRAOCULAR LENS PLACEMENT ([place]) RIGHT DIABETIC;  Surgeon: Leandrew Koyanagi, MD;  Location: [place];  Service: Ophthalmology;  Laterality: Right;  4.72 1:03.2 7.4%   CHOLECYSTECTOMY  1977   COLONOSCOPY WITH PROPOFOL N/A 01/21/2016   Procedure: COLONOSCOPY WITH PROPOFOL;  Surgeon: Lucilla Lame, MD;  Location: [facility];  Service: Endoscopy;  Laterality: N/A;   DILATION AND CURETTAGE OF UTERUS     Due to Amenorrhea   ESOPHAGOGASTRODUODENOSCOPY (EGD) WITH PROPOFOL N/A 08/16/2018   Procedure: ESOPHAGOGASTRODUODENOSCOPY (EGD) WITH PROPOFOL;  Surgeon: Lucilla Lame, MD;  Location: [facility];  Service: Endoscopy;  Laterality: N/A;   [place]     HERNIA REPAIR  2011   Temporal Area Excision Biopsy     For Birth Mark Changes, Negative Pathology   TONSILLECTOMY AND ADENOIDECTOMY      Family History  Problem Relation Age of Onset   Cancer Mother        brain tumor   Kidney disease Mother    Hypertension Mother    Heart disease Father    COPD Father    Hypertension Sister    Arthritis Sister    Cancer Sister    Heart murmur Sister    GER disease Sister    Lupus Sister    Diabetes Sister    Arthritis Sister    Osteopenia Sister    Heart disease Sister    Hypertension Sister    Breast cancer Maternal Aunt 18   Leukemia Maternal Aunt     Social History   Tobacco Use   Smoking status: Former    Packs/day: 2.00    Years: 15.00    Total pack years: 30.00    Types: Cigarettes    Quit date: 1983    Years since quitting: 40.5   Smokeless tobacco: Never   Tobacco comments:    smoking cessation materials not required  Substance Use Topics   Alcohol use: Not Currently    Alcohol/week: 0.0 standard drinks of alcohol      Current Outpatient Medications:    acetaminophen (TYLENOL) 500 MG tablet, Take 1 tablet (500 mg total) by mouth every 6 (six) hours as needed. (Patient taking differently: Take 1,000 mg by mouth every 8 (eight) hours.), Disp: 480 tablet, Rfl: 0   albuterol (VENTOLIN HFA) 108 (90 Base) MCG/ACT inhaler, Inhale 2 puffs into the lungs every 6 (six) hours as needed for wheezing or shortness of breath., Disp: 8 g, Rfl: 0   allopurinol (ZYLOPRIM) 300 MG tablet, TAKE 1 TABLET BY MOUTH DAILY, Disp: 90 tablet, Rfl: 3   benazepril (LOTENSIN) 5 MG tablet, TAKE 1 TABLET BY MOUTH DAILY, Disp: 90 tablet, Rfl: 3   diclofenac Sodium (VOLTAREN)  1 % GEL, APPLY 4 GRAMS TOPICALLY 4 TIMES DAILY AS DIRECTED, Disp: 300 g, Rfl: 3   Dulaglutide (TRULICITY) 1.5 XB/3.5HG SOPN, Inject 1.5 mg into the skin once a week., Disp: 12 pen, Rfl: 3   furosemide (LASIX) 40 MG tablet, TAKE 1 TABLET BY MOUTH DAILY, Disp: 90 tablet, Rfl: 3   gabapentin (NEURONTIN) 300 MG capsule, TAKE 1 CAPSULE BY MOUTH IN THE MORNING, 1 CAPSULE IN THE EVENING AND 2 CAPSULES AT NIGHT, Disp: 360 capsule, Rfl: 3   glucose blood (ONETOUCH VERIO) test strip, USE AS DIRECTED, Disp: 50 each, Rfl: 1   Lancets (ONETOUCH ULTRASOFT) lancets, Use as instructed, Disp: 100 each, Rfl: 12   methocarbamol (ROBAXIN) 500 MG tablet, Take 1 tablet (500 mg total) by mouth every 6 (six) hours as needed., Disp: 15 tablet, Rfl: 0   potassium chloride SA (KLOR-CON M) 20 MEQ tablet, TAKE 1 TABLET BY MOUTH DAILY, Disp: 90 tablet, Rfl: 3   traMADol (ULTRAM) 50 MG tablet, Take 25-50 mg by mouth 2 (two) times daily as needed., Disp: , Rfl:    triamcinolone ointment (KENALOG) 0.1 %, APPLY TOPICALLY TWICE DAILY AS NEEDED. GENERIC EQUIVALENT FOR KENALOG, Disp: 90 g, Rfl: 0   vitamin B-12 (CYANOCOBALAMIN) 500 MCG tablet, Take 500 mcg by mouth daily., Disp: , Rfl:    Vitamin D, Cholecalciferol, 25 MCG (1000 UT) TABS, Take 1 capsule by mouth daily., Disp: 90 tablet, Rfl: 1    atorvastatin (LIPITOR) 20 MG tablet, Take 1 tablet (20 mg total) by mouth daily. Daily for cholesterol, Disp: 90 tablet, Rfl: 0  Allergies  Allergen Reactions   Codeine Hives and Nausea And Vomiting    I personally reviewed active problem list, medication list, allergies, family history, social history, health maintenance with the patient/caregiver today.   ROS  Constitutional: Negative for fever , positive for mild  weight change.  Respiratory: Negative for cough and shortness of breath.   Cardiovascular: Negative for chest pain or palpitations.  Gastrointestinal: Negative for abdominal pain, no bowel changes.  Musculoskeletal: positive  for gait problem and intermittent  joint swelling.  Skin: Negative for rash.  Neurological: Negative for dizziness or headache.  No other specific complaints in a complete review of systems (except as listed in HPI above).   Objective  Vitals:   04/22/22 0949  BP: 122/76  Pulse: 85  Resp: 16  SpO2: 95%  Weight: 266 lb (120.7 kg)  Height: '5\' 2"'$  (1.575 m)    Body mass index is 48.65 kg/m.  Physical Exam  Constitutional: Patient appears well-developed and well-nourished. Obese  No distress.  HEENT: head atraumatic, normocephalic, pupils equal and reactive to light,, neck supple Cardiovascular: Normal rate, regular rhythm and normal heart sounds.  No murmur heard. No BLE edema. Pulmonary/Chest: Effort normal and breath sounds normal. No respiratory distress. Abdominal: Soft.  There is no tenderness. Muscular skeletal: using walker, walks slowly  Psychiatric: Patient has a normal mood and affect. behavior is normal. Judgment and thought content normal.   Recent Results (from the past 2160 hour(s))  POCT HgB A1C     Status: Abnormal   Collection Time: 04/22/22  9:59 AM  Result Value Ref Range   Hemoglobin A1C 6.4 (A) 4.0 - 5.6 %   HbA1c POC (<> result, manual entry)     HbA1c, POC (prediabetic range)     HbA1c, POC (controlled diabetic  range)        PHQ2/9:    04/22/2022    9:58 AM 12/17/2021  10:48 AM 10/07/2021    9:57 AM 09/03/2021    2:38 PM 08/22/2021    2:33 PM  Depression screen PHQ 2/9  Decreased Interest 0 0 0 0 0  Down, Depressed, Hopeless 0 0 0 2 0  PHQ - 2 Score 0 0 0 2 0  Altered sleeping 0 0 0 1 0  Tired, decreased energy 0 0 0 0 1  Change in appetite 0 0 0 0 0  Feeling bad or failure about yourself  0 0 0 0 0  Trouble concentrating 0 0 0 0 0  Moving slowly or fidgety/restless 0 0 0 0 1  Suicidal thoughts 0 0 0 0 0  PHQ-9 Score 0 0 0 3 2  Difficult doing work/chores  Not difficult at all Not difficult at all  Not difficult at all    phq 9 is negative   Fall Risk:    04/22/2022    9:58 AM 12/17/2021   10:48 AM 10/07/2021    9:57 AM 08/15/2021   12:07 PM 06/24/2021    3:57 PM  Fall Risk   Falls in the past year?  0 0 0 0  Number falls in past yr: 0 0 0  0  Injury with Fall? 0 0 0  0  Risk for fall due to : Impaired balance/gait No Fall Risks No Fall Risks  Impaired balance/gait  Follow up Falls prevention discussed Falls prevention discussed Falls prevention discussed Falls prevention discussed Falls prevention discussed      Functional Status Survey: Is the patient deaf or have difficulty hearing?: No Does the patient have difficulty seeing, even when wearing glasses/contacts?: Yes Does the patient have difficulty concentrating, remembering, or making decisions?: No Does the patient have difficulty walking or climbing stairs?: Yes Does the patient have difficulty dressing or bathing?: No Does the patient have difficulty doing errands alone such as visiting a doctor's office or shopping?: No    Assessment & Plan  1. Controlled type 2 diabetes mellitus with stage 3 chronic kidney disease, without long-term current use of insulin (HCC)  - POCT HgB A1C  2. Stage 3a chronic kidney disease ([place])  Reviewed labs  3. Hyperlipidemia associated with type 2 diabetes mellitus ([place])  She  will try to take half pill daily instead of every other day of atorvastatin 40 mg  4. Senile purpura (HCC)  Stable   5. Obstructive apnea  Compliant   6. Breast cancer screening by mammogram  - MM 3D SCREEN BREAST BILATERAL; Future  7. Benign essential HTN  At goal   8. Dyslipidemia  - atorvastatin (LIPITOR) 20 MG tablet; Take 1 tablet (20 mg total) by mouth daily. Daily for cholesterol  Dispense: 90 tablet; Refill: 0  9. B12 deficiency  Continue supplementation   10. Asthma, mild intermittent, well-controlled   11. Vitamin D deficiency  Continue supplements  12. Controlled gout   13. Osteoarthritis, chronic   14. Bilateral edema of lower extremity  Worse this time of the year, discussed elevating legs   15. History of iron deficiency anemia   16. Gait instability   stable

## 2022-04-22 ENCOUNTER — Encounter: Payer: Self-pay | Admitting: Family Medicine

## 2022-04-22 ENCOUNTER — Ambulatory Visit (INDEPENDENT_AMBULATORY_CARE_PROVIDER_SITE_OTHER): Payer: Medicare Other | Admitting: Family Medicine

## 2022-04-22 VITALS — BP 122/76 | HR 85 | Resp 16 | Ht 62.0 in | Wt 266.0 lb

## 2022-04-22 DIAGNOSIS — E785 Hyperlipidemia, unspecified: Secondary | ICD-10-CM

## 2022-04-22 DIAGNOSIS — M1990 Unspecified osteoarthritis, unspecified site: Secondary | ICD-10-CM

## 2022-04-22 DIAGNOSIS — E538 Deficiency of other specified B group vitamins: Secondary | ICD-10-CM

## 2022-04-22 DIAGNOSIS — E559 Vitamin D deficiency, unspecified: Secondary | ICD-10-CM

## 2022-04-22 DIAGNOSIS — Z1231 Encounter for screening mammogram for malignant neoplasm of breast: Secondary | ICD-10-CM

## 2022-04-22 DIAGNOSIS — N183 Chronic kidney disease, stage 3 unspecified: Secondary | ICD-10-CM

## 2022-04-22 DIAGNOSIS — I10 Essential (primary) hypertension: Secondary | ICD-10-CM

## 2022-04-22 DIAGNOSIS — R2681 Unsteadiness on feet: Secondary | ICD-10-CM

## 2022-04-22 DIAGNOSIS — N1831 Chronic kidney disease, stage 3a: Secondary | ICD-10-CM | POA: Diagnosis not present

## 2022-04-22 DIAGNOSIS — Z862 Personal history of diseases of the blood and blood-forming organs and certain disorders involving the immune mechanism: Secondary | ICD-10-CM

## 2022-04-22 DIAGNOSIS — E1122 Type 2 diabetes mellitus with diabetic chronic kidney disease: Secondary | ICD-10-CM

## 2022-04-22 DIAGNOSIS — D692 Other nonthrombocytopenic purpura: Secondary | ICD-10-CM

## 2022-04-22 DIAGNOSIS — E1169 Type 2 diabetes mellitus with other specified complication: Secondary | ICD-10-CM | POA: Diagnosis not present

## 2022-04-22 DIAGNOSIS — M109 Gout, unspecified: Secondary | ICD-10-CM | POA: Diagnosis not present

## 2022-04-22 DIAGNOSIS — R600 Localized edema: Secondary | ICD-10-CM

## 2022-04-22 DIAGNOSIS — G4733 Obstructive sleep apnea (adult) (pediatric): Secondary | ICD-10-CM

## 2022-04-22 DIAGNOSIS — J4520 Mild intermittent asthma, uncomplicated: Secondary | ICD-10-CM | POA: Diagnosis not present

## 2022-04-22 LAB — POCT GLYCOSYLATED HEMOGLOBIN (HGB A1C): Hemoglobin A1C: 6.4 % — AB (ref 4.0–5.6)

## 2022-04-22 MED ORDER — ATORVASTATIN CALCIUM 20 MG PO TABS: 20.0000 mg | ORAL_TABLET | Freq: Every day | ORAL | 0 refills | Status: DC

## 2022-05-16 DIAGNOSIS — J449 Chronic obstructive pulmonary disease, unspecified: Secondary | ICD-10-CM | POA: Diagnosis not present

## 2022-05-19 ENCOUNTER — Ambulatory Visit
Admission: RE | Admit: 2022-05-19 | Discharge: 2022-05-19 | Disposition: A | Discharge status: Home / Self Care | Payer: Medicare Other | Source: Ambulatory Visit | Attending: Family Medicine | Admitting: Family Medicine

## 2022-05-19 DIAGNOSIS — M85851 Other specified disorders of bone density and structure, right thigh: Secondary | ICD-10-CM | POA: Diagnosis not present

## 2022-05-19 DIAGNOSIS — Z1231 Encounter for screening mammogram for malignant neoplasm of breast: Secondary | ICD-10-CM | POA: Insufficient documentation

## 2022-05-19 DIAGNOSIS — Z780 Asymptomatic menopausal state: Secondary | ICD-10-CM | POA: Insufficient documentation

## 2022-05-26 ENCOUNTER — Other Ambulatory Visit: Payer: Self-pay | Admitting: Family Medicine

## 2022-05-26 ENCOUNTER — Other Ambulatory Visit: Payer: Self-pay | Admitting: Internal Medicine

## 2022-05-26 DIAGNOSIS — L409 Psoriasis, unspecified: Secondary | ICD-10-CM

## 2022-05-26 DIAGNOSIS — M1990 Unspecified osteoarthritis, unspecified site: Secondary | ICD-10-CM

## 2022-05-26 MED ORDER — DICLOFENAC SODIUM 1 % EX GEL: CUTANEOUS | 1 refills | Status: DC

## 2022-05-26 NOTE — Telephone Encounter (Signed)
Rx did not go through re sent

## 2022-05-26 NOTE — Telephone Encounter (Signed)
Requested medication (s) are due for refill today - yes  Requested medication (s) are on the active medication list -yes  Future visit scheduled -yes  Last refill: 03/19/22 90g  Notes to clinic: non delegated Rx  Requested Prescriptions  Pending Prescriptions Disp Refills   triamcinolone ointment (KENALOG) 0.1 % [Pharmacy Med Name: TRIAMCINOLONE 0.1% OINTMENT 30GM] 90 g 0    Sig: APPLY TOPICALLY TWICE DAILY AS NEEDED. GENERIC EQUIVALENT FOR KENALOG     Not Delegated - Dermatology:  Corticosteroids Failed - 05/26/2022  2:44 PM      Failed - This refill cannot be delegated      Passed - Valid encounter within last 12 months    Recent Outpatient Visits           1 month ago Controlled type 2 diabetes mellitus with stage 3 chronic kidney disease, without long-term current use of insulin [facility])   [facility] [place], Drue Stager, MD   5 months ago Hypertension associated with type 2 diabetes mellitus [facility])   [facility] [place], Drue Stager, MD   7 months ago Lower leg mass, left   [facility] [place], Drue Stager, MD   9 months ago Controlled type 2 diabetes mellitus with stage 3 chronic kidney disease, without long-term current use of insulin [facility])   [facility] [place], Drue Stager, MD   1 year ago Controlled type 2 diabetes mellitus with stage 3 chronic kidney disease, without long-term current use of insulin [facility])   [facility] Steele Sizer, MD       Future Appointments             In 3 weeks Steele Sizer, MD [facility], [place]   In 1 month  [place]   In 1 month Steele Sizer, MD [place]   In 2 months Steele Sizer, MD [facility], [facility]               Requested Prescriptions  Pending Prescriptions Disp Refills   triamcinolone ointment (KENALOG) 0.1 % [Pharmacy Med Name: TRIAMCINOLONE  0.1% OINTMENT 30GM] 90 g 0    Sig: APPLY TOPICALLY TWICE DAILY AS NEEDED. GENERIC EQUIVALENT FOR KENALOG     Not Delegated - Dermatology:  Corticosteroids Failed - 05/26/2022  2:44 PM      Failed - This refill cannot be delegated      Passed - Valid encounter within last 12 months    Recent Outpatient Visits           1 month ago Controlled type 2 diabetes mellitus with stage 3 chronic kidney disease, without long-term current use of insulin [facility])   [facility] [place], Drue Stager, MD   5 months ago Hypertension associated with type 2 diabetes mellitus [facility])   [facility] [place], Drue Stager, MD   7 months ago Lower leg mass, left   [facility] [place], Drue Stager, MD   9 months ago Controlled type 2 diabetes mellitus with stage 3 chronic kidney disease, without long-term current use of insulin [facility])   [facility] [place], Drue Stager, MD   1 year ago Controlled type 2 diabetes mellitus with stage 3 chronic kidney disease, without long-term current use of insulin [facility])   [facility] Steele Sizer, MD       Future Appointments  In 3 weeks Steele Sizer, MD [facility], [facility]   In 1 month  [facility], [place]   In 1 month Steele Sizer, MD [facility], [place]   In 2 months Steele Sizer, MD [place]

## 2022-05-26 NOTE — Addendum Note (Signed)
Addended by: Docia Furl on: 05/26/2022 04:32 PM   Modules accepted: Orders

## 2022-06-16 DIAGNOSIS — J449 Chronic obstructive pulmonary disease, unspecified: Secondary | ICD-10-CM | POA: Diagnosis not present

## 2022-06-17 NOTE — Progress Notes (Unsigned)
Name: Amber Avery   MRN: [id number]    DOB: 03/11/1946   Date:06/17/2022       Progress Note  Subjective  Chief Complaint  Follow Up  I connected with  Amber Avery  on 06/17/22 at  7:40 AM EDT by a video enabled telemedicine application and verified that I am speaking with the correct person using two identifiers.  I discussed the limitations of evaluation and management by telemedicine and the availability of in person appointments. The patient expressed understanding and agreed to proceed with the virtual visit  Staff also discussed with the patient that there may be a patient responsible charge related to this service. Patient Location: at home  Provider Location: [facility] Additional Individuals present: alone   HPI      OSA: she has a new CPAP machine, she likes the fact that the humidified oxygen is warm, she is usually sleeping 7-8 hours . She did not like the full face mask, she is using a nose device since April. She is unable to sleep without CPAP . She usually wakes up feeling rested, no headaches in am's . She did not sleep well last night because she knew that she had to get up early for the appointment this morning     She wakes up during the night due to pain, still saying Dr. Phyllis Ginger , she is currently taking half tramadol BID and seems to help with pain   Patient Active Problem List   Diagnosis Date Noted   Gait instability 12/13/2020   Stage 3a chronic kidney disease ([place]) 12/13/2020   Cellulitis of right ankle 04/12/2020   Morbid obesity with BMI of 45.0-49.9, adult ([place]) 02/06/2020   History of gastritis 08/14/2019   Angiodysplasia of stomach and duodenum    Iron deficiency anemia 04/20/2018   Anemia, unspecified 85/27/7824   Lichen sclerosus of female genitalia 06/29/2016   Primary osteoarthritis of both knees 04/07/2016   Special screening for malignant neoplasms, colon    Right thyroid nodule 06/26/2015   Asthma, intermittent 04/03/2015   Benign  essential HTN 04/03/2015   Carpal tunnel syndrome 04/03/2015   Chronic kidney disease (CKD), stage II (mild) 04/03/2015   Controlled gout 04/03/2015   Arteriosclerosis of coronary artery 04/03/2015   Diabetes mellitus with renal manifestations, controlled ([place]) 04/03/2015   Dyslipidemia 04/03/2015   Edema extremities 04/03/2015   Elevated sedimentation rate 04/03/2015   Knee pain 04/03/2015   Lumbar radiculitis 04/03/2015   Obstructive apnea 04/03/2015   Lumbosacral spondylosis 04/03/2015   Osteoarthritis, chronic 04/03/2015   Psoriasis 04/03/2015   Vitamin D deficiency 04/03/2015   Varicose veins 04/03/2015   Goiter 12/23/2014   Degeneration of intervertebral disc of lumbar region 08/23/2014   Corns and callosity 06/11/2009    Past Surgical History:  Procedure Laterality Date   APPENDECTOMY     CATARACT EXTRACTION W/PHACO Left 08/14/2020   Procedure: CATARACT EXTRACTION PHACO AND INTRAOCULAR LENS PLACEMENT ([facility]) LEFT DIABETIC 3.56 00:59.6 6.0%;  Surgeon: Leandrew Koyanagi, MD;  Location: [place];  Service: Ophthalmology;  Laterality: Left;  Diabetic   CATARACT EXTRACTION W/PHACO Right 09/04/2020   Procedure: CATARACT EXTRACTION PHACO AND INTRAOCULAR LENS PLACEMENT ([place]) RIGHT DIABETIC;  Surgeon: Leandrew Koyanagi, MD;  Location: [place];  Service: Ophthalmology;  Laterality: Right;  4.72 1:03.2 7.4%   CHOLECYSTECTOMY  1977   COLONOSCOPY WITH PROPOFOL N/A 01/21/2016   Procedure: COLONOSCOPY WITH PROPOFOL;  Surgeon: Lucilla Lame, MD;  Location: [facility];  Service: Endoscopy;  Laterality: N/A;   DILATION AND CURETTAGE OF UTERUS     Due to Amenorrhea   ESOPHAGOGASTRODUODENOSCOPY (EGD) WITH PROPOFOL N/A 08/16/2018   Procedure: ESOPHAGOGASTRODUODENOSCOPY (EGD) WITH PROPOFOL;  Surgeon: Lucilla Lame, MD;  Location: [facility];  Service: Endoscopy;  Laterality: N/A;   [place]     HERNIA REPAIR  2011   Temporal  Area Excision Biopsy     For Birth Mark Changes, Negative Pathology   TONSILLECTOMY AND ADENOIDECTOMY      Family History  Problem Relation Age of Onset   Cancer Mother        brain tumor   Kidney disease Mother    Hypertension Mother    Heart disease Father    COPD Father    Hypertension Sister    Arthritis Sister    Cancer Sister    Heart murmur Sister    GER disease Sister    Lupus Sister    Diabetes Sister    Arthritis Sister    Osteopenia Sister    Heart disease Sister    Hypertension Sister    Breast cancer Maternal Aunt 40   Leukemia Maternal Aunt     Social History   Socioeconomic History   Marital status: Divorced    Spouse name: Not on file   Number of children: 0   Years of education: Not on file   Highest education level: 12th grade  Occupational History   Occupation: Retired  Tobacco Use   Smoking status: Former    Packs/day: 2.00    Years: 15.00    Total pack years: 30.00    Types: Cigarettes    Quit date: 1983    Years since quitting: 40.6   Smokeless tobacco: Never   Tobacco comments:    smoking cessation materials not required  Vaping Use   Vaping Use: Never used  Substance and Sexual Activity   Alcohol use: Not Currently    Alcohol/week: 0.0 standard drinks of alcohol   Drug use: No   Sexual activity: Not Currently  Other Topics Concern   Not on file  Social History Narrative   Patient just moved in with her youngest sister and a roommate    Social Determinants of Health   Financial Resource Strain: Low Risk  (09/17/2021)   Overall Financial Resource Strain (CARDIA)    Difficulty of Paying Living Expenses: Not hard at all  Food Insecurity: No Food Insecurity (06/24/2021)   Hunger Vital Sign    Worried About Running Out of Food in the Last Year: Never true    Ran Out of Food in the Last Year: Never true  Transportation Needs: No Transportation Needs (06/24/2021)   PRAPARE - Hydrologist (Medical): No     Lack of Transportation (Non-Medical): No  Physical Activity: Inactive (06/24/2021)   Exercise Vital Sign    Days of Exercise per Week: 0 days    Minutes of Exercise per Session: 0 min  Stress: No Stress Concern Present (06/24/2021)   [place]    Feeling of Stress : Only a little  Social Connections: Socially Isolated (06/24/2021)   Social Connection and Isolation Panel [NHANES]    Frequency of Communication with Friends and Family: More than three times a week    Frequency of Social Gatherings with Friends and Family: More than three times a week    Attends Religious Services: Never    Active  Member of Clubs or Organizations: No    Attends Archivist Meetings: Never    Marital Status: Divorced  Human resources officer Violence: Not At Risk (06/24/2021)   Humiliation, Afraid, Rape, and Kick questionnaire    Fear of Current or Ex-Partner: No    Emotionally Abused: No    Physically Abused: No    Sexually Abused: No     Current Outpatient Medications:    acetaminophen (TYLENOL) 500 MG tablet, Take 1 tablet (500 mg total) by mouth every 6 (six) hours as needed. (Patient taking differently: Take 1,000 mg by mouth every 8 (eight) hours.), Disp: 480 tablet, Rfl: 0   albuterol (VENTOLIN HFA) 108 (90 Base) MCG/ACT inhaler, Inhale 2 puffs into the lungs every 6 (six) hours as needed for wheezing or shortness of breath., Disp: 8 g, Rfl: 0   allopurinol (ZYLOPRIM) 300 MG tablet, TAKE 1 TABLET BY MOUTH DAILY, Disp: 90 tablet, Rfl: 3   atorvastatin (LIPITOR) 20 MG tablet, Take 1 tablet (20 mg total) by mouth daily. Daily for cholesterol, Disp: 90 tablet, Rfl: 0   benazepril (LOTENSIN) 5 MG tablet, TAKE 1 TABLET BY MOUTH DAILY, Disp: 90 tablet, Rfl: 3   diclofenac Sodium (VOLTAREN) 1 % GEL, APPLY 4 GRAMS TOPICALLY FOUR TIMES DAILY AS DIRECTED GENERIC EQUIVALENT FOR VOL.TAREN, Disp: 300 g, Rfl: 1   Dulaglutide (TRULICITY) 1.5 YI/5.0YD  SOPN, Inject 1.5 mg into the skin once a week., Disp: 12 pen, Rfl: 3   furosemide (LASIX) 40 MG tablet, TAKE 1 TABLET BY MOUTH DAILY, Disp: 90 tablet, Rfl: 3   gabapentin (NEURONTIN) 300 MG capsule, TAKE 1 CAPSULE BY MOUTH IN THE MORNING, 1 CAPSULE IN THE EVENING AND 2 CAPSULES AT NIGHT, Disp: 360 capsule, Rfl: 3   glucose blood (ONETOUCH VERIO) test strip, USE AS DIRECTED, Disp: 50 each, Rfl: 1   Lancets (ONETOUCH ULTRASOFT) lancets, Use as instructed, Disp: 100 each, Rfl: 12   methocarbamol (ROBAXIN) 500 MG tablet, Take 1 tablet (500 mg total) by mouth every 6 (six) hours as needed., Disp: 15 tablet, Rfl: 0   potassium chloride SA (KLOR-CON M) 20 MEQ tablet, TAKE 1 TABLET BY MOUTH DAILY, Disp: 90 tablet, Rfl: 3   traMADol (ULTRAM) 50 MG tablet, Take 25-50 mg by mouth 2 (two) times daily as needed., Disp: , Rfl:    triamcinolone ointment (KENALOG) 0.1 %, APPLY TOPICALLY TWICE DAILY AS NEEDED. GENERIC EQUIVALENT FOR KENALOG, Disp: 90 g, Rfl: 0   vitamin B-12 (CYANOCOBALAMIN) 500 MCG tablet, Take 500 mcg by mouth daily., Disp: , Rfl:    Vitamin D, Cholecalciferol, 25 MCG (1000 UT) TABS, Take 1 capsule by mouth daily., Disp: 90 tablet, Rfl: 1  Allergies  Allergen Reactions   Codeine Hives and Nausea And Vomiting    I personally reviewed active problem list, medication list, allergies, family history, social history, health maintenance with the patient/caregiver today.   ROS  Ten systems reviewed and is negative except as mentioned in HPI   Objective  Virtual encounter, vitals not obtained.  There is no height or weight on file to calculate BMI.  Physical Exam  Awake, alert and oriented   PHQ2/9:    04/22/2022    9:58 AM 12/17/2021   10:48 AM 10/07/2021    9:57 AM 09/03/2021    2:38 PM 08/22/2021    2:33 PM  Depression screen PHQ 2/9  Decreased Interest 0 0 0 0 0  Down, Depressed, Hopeless 0 0 0 2 0  PHQ - 2 Score  0 0 0 2 0  Altered sleeping 0 0 0 1 0  Tired, decreased energy 0  0 0 0 1  Change in appetite 0 0 0 0 0  Feeling bad or failure about yourself  0 0 0 0 0  Trouble concentrating 0 0 0 0 0  Moving slowly or fidgety/restless 0 0 0 0 1  Suicidal thoughts 0 0 0 0 0  PHQ-9 Score 0 0 0 3 2  Difficult doing work/chores  Not difficult at all Not difficult at all  Not difficult at all   PHQ-2/9 Result is negative.    Fall Risk:    04/22/2022    9:58 AM 12/17/2021   10:48 AM 10/07/2021    9:57 AM 08/15/2021   12:07 PM 06/24/2021    3:57 PM  Fall Risk   Falls in the past year?  0 0 0 0  Number falls in past yr: 0 0 0  0  Injury with Fall? 0 0 0  0  Risk for fall due to : Impaired balance/gait No Fall Risks No Fall Risks  Impaired balance/gait  Follow up Falls prevention discussed Falls prevention discussed Falls prevention discussed Falls prevention discussed Falls prevention discussed     Assessment & Plan  1. OSA on CPAP  Continue CPAP,she likes the older tubes better   2. Greater trochanteric bursitis, left  Under the care of Dr. Phyllis Ginger   3. Osteoarthritis, chronic   Continue medication   I discussed the assessment and treatment plan with the patient. The patient was provided an opportunity to ask questions and all were answered. The patient agreed with the plan and demonstrated an understanding of the instructions.  The patient was advised to call back or seek an in-person evaluation if the symptoms worsen or if the condition fails to improve as anticipated.  I provided 15 minutes of non-face-to-face time during this encounter.

## 2022-06-18 ENCOUNTER — Telehealth: Payer: Medicare Other | Admitting: Family Medicine

## 2022-06-18 ENCOUNTER — Telehealth (INDEPENDENT_AMBULATORY_CARE_PROVIDER_SITE_OTHER): Payer: Medicare Other | Admitting: Family Medicine

## 2022-06-18 ENCOUNTER — Encounter: Payer: Self-pay | Admitting: Family Medicine

## 2022-06-18 VITALS — BP 140/83

## 2022-06-18 DIAGNOSIS — M1990 Unspecified osteoarthritis, unspecified site: Secondary | ICD-10-CM | POA: Diagnosis not present

## 2022-06-18 DIAGNOSIS — M7062 Trochanteric bursitis, left hip: Secondary | ICD-10-CM | POA: Insufficient documentation

## 2022-06-18 DIAGNOSIS — Z9989 Dependence on other enabling machines and devices: Secondary | ICD-10-CM | POA: Diagnosis not present

## 2022-06-18 DIAGNOSIS — G4733 Obstructive sleep apnea (adult) (pediatric): Secondary | ICD-10-CM | POA: Diagnosis not present

## 2022-06-25 ENCOUNTER — Ambulatory Visit (INDEPENDENT_AMBULATORY_CARE_PROVIDER_SITE_OTHER): Payer: Medicare Other

## 2022-06-25 VITALS — BP 143/82 | Wt 266.0 lb

## 2022-06-25 DIAGNOSIS — Z0000 Encounter for general adult medical examination without abnormal findings: Secondary | ICD-10-CM

## 2022-06-25 NOTE — Progress Notes (Signed)
Subjective:  I connected with  Amber Avery on 06/25/22 by a audio enabled telemedicine application and verified that I am speaking with the correct person using two identifiers.  Patient Location: Home  Provider Location: Office/Clinic  I discussed the limitations of evaluation and management by telemedicine. The patient expressed understanding and agreed to proceed.  Amber Avery is a 76 y.o. female who presents for Medicare Annual (Subsequent) preventive examination.  Review of Systems    Defer to PCP       Objective:    There were no vitals filed for this visit. There is no height or weight on file to calculate BMI.     12/21/2021    9:29 AM 06/24/2021    3:56 PM 09/04/2020    7:06 AM 08/14/2020    6:34 AM 06/20/2019    3:51 PM 09/01/2018    1:19 PM 08/16/2018   10:50 AM  Advanced Directives  Does Patient Have a Medical Advance Directive? No Yes Yes Yes Yes Yes Yes  Type of Social research officer, government;Living will [place];Living will [place];Living will [place];Living will  [place];Living will  Does patient want to make changes to medical advance directive?   No - Patient declined No - Patient declined     Copy of [place] in Chart?  Yes - validated most recent copy scanned in chart (See row information) Yes - validated most recent copy scanned in chart (See row information) Yes - validated most recent copy scanned in chart (See row information) Yes - validated most recent copy scanned in chart (See row information)  Yes  Would patient like information on creating a medical advance directive? No - Patient declined          Current Medications (verified) Outpatient Encounter Medications as of 06/25/2022  Medication Sig   acetaminophen (TYLENOL) 500 MG tablet Take 1 tablet (500 mg total) by mouth every 6 (six) hours as needed. (Patient taking  differently: Take 1,000 mg by mouth every 8 (eight) hours.)   albuterol (VENTOLIN HFA) 108 (90 Base) MCG/ACT inhaler Inhale 2 puffs into the lungs every 6 (six) hours as needed for wheezing or shortness of breath.   allopurinol (ZYLOPRIM) 300 MG tablet TAKE 1 TABLET BY MOUTH DAILY   atorvastatin (LIPITOR) 20 MG tablet Take 1 tablet (20 mg total) by mouth daily. Daily for cholesterol   benazepril (LOTENSIN) 5 MG tablet TAKE 1 TABLET BY MOUTH DAILY   diclofenac Sodium (VOLTAREN) 1 % GEL APPLY 4 GRAMS TOPICALLY FOUR TIMES DAILY AS DIRECTED GENERIC EQUIVALENT FOR VOL.TAREN   Dulaglutide (TRULICITY) 1.5 NO/6.7EH SOPN Inject 1.5 mg into the skin once a week.   furosemide (LASIX) 40 MG tablet TAKE 1 TABLET BY MOUTH DAILY   gabapentin (NEURONTIN) 300 MG capsule TAKE 1 CAPSULE BY MOUTH IN THE MORNING, 1 CAPSULE IN THE EVENING AND 2 CAPSULES AT NIGHT   glucose blood (ONETOUCH VERIO) test strip USE AS DIRECTED   Lancets (ONETOUCH ULTRASOFT) lancets Use as instructed   methocarbamol (ROBAXIN) 500 MG tablet Take 1 tablet (500 mg total) by mouth every 6 (six) hours as needed.   potassium chloride SA (KLOR-CON M) 20 MEQ tablet TAKE 1 TABLET BY MOUTH DAILY   traMADol (ULTRAM) 50 MG tablet Take 25-50 mg by mouth 2 (two) times daily as needed.   triamcinolone ointment (KENALOG) 0.1 % APPLY TOPICALLY TWICE DAILY AS NEEDED. GENERIC  EQUIVALENT FOR KENALOG   vitamin B-12 (CYANOCOBALAMIN) 500 MCG tablet Take 500 mcg by mouth daily.   Vitamin D, Cholecalciferol, 25 MCG (1000 UT) TABS Take 1 capsule by mouth daily.   No facility-administered encounter medications on file as of 06/25/2022.    Allergies (verified) Codeine   History: Past Medical History:  Diagnosis Date   Asthma    Diabetes mellitus without complication ([place])    Gout    Hyperlipidemia    Hypertension    Iron deficiency anemia 04/20/2018   PONV (postoperative nausea and vomiting)    after hemorroid surgery   Shortness of breath dyspnea    Sleep  apnea    Past Surgical History:  Procedure Laterality Date   APPENDECTOMY     CATARACT EXTRACTION W/PHACO Left 08/14/2020   Procedure: CATARACT EXTRACTION PHACO AND INTRAOCULAR LENS PLACEMENT (I[facility]) LEFT DIABETIC 3.56 00:59.6 6.0%;  Surgeon: Leandrew Koyanagi, MD;  Location: [facility];  Service: Ophthalmology;  Laterality: Left;  Diabetic   CATARACT EXTRACTION W/PHACO Right 09/04/2020   Procedure: CATARACT EXTRACTION PHACO AND INTRAOCULAR LENS PLACEMENT ([place]) RIGHT DIABETIC;  Surgeon: Leandrew Koyanagi, MD;  Location: [facility];  Service: Ophthalmology;  Laterality: Right;  4.72 1:03.2 7.4%   CHOLECYSTECTOMY  1977   COLONOSCOPY WITH PROPOFOL N/A 01/21/2016   Procedure: COLONOSCOPY WITH PROPOFOL;  Surgeon: Lucilla Lame, MD;  Location: [facility];  Service: Endoscopy;  Laterality: N/A;   DILATION AND CURETTAGE OF UTERUS     Due to Amenorrhea   ESOPHAGOGASTRODUODENOSCOPY (EGD) WITH PROPOFOL N/A 08/16/2018   Procedure: ESOPHAGOGASTRODUODENOSCOPY (EGD) WITH PROPOFOL;  Surgeon: Lucilla Lame, MD;  Location: [facility];  Service: Endoscopy;  Laterality: N/A;   [place]     HERNIA REPAIR  2011   Temporal Area Excision Biopsy     For Birth Mark Changes, Negative Pathology   TONSILLECTOMY AND ADENOIDECTOMY     Family History  Problem Relation Age of Onset   Cancer Mother        brain tumor   Kidney disease Mother    Hypertension Mother    Heart disease Father    COPD Father    Hypertension Sister    Arthritis Sister    Cancer Sister    Heart murmur Sister    GER disease Sister    Lupus Sister    Diabetes Sister    Arthritis Sister    Osteopenia Sister    Heart disease Sister    Hypertension Sister    Breast cancer Maternal Aunt 73   Leukemia Maternal Aunt    Social History   Socioeconomic History   Marital status: Divorced    Spouse name: Not on file   Number of children: 0   Years of education: Not on file    Highest education level: 12th grade  Occupational History   Occupation: Retired  Tobacco Use   Smoking status: Former    Packs/day: 2.00    Years: 15.00    Total pack years: 30.00    Types: Cigarettes    Quit date: 1983    Years since quitting: 40.7   Smokeless tobacco: Never   Tobacco comments:    smoking cessation materials not required  Vaping Use   Vaping Use: Never used  Substance and Sexual Activity   Alcohol use: Not Currently    Alcohol/week: 0.0 standard drinks of alcohol   Drug use: No   Sexual activity: Not Currently  Other Topics Concern   Not  on file  Social History Narrative   Patient just moved in with her youngest sister and a roommate    Social Determinants of Health   Financial Resource Strain: Low Risk  (09/17/2021)   Overall Financial Resource Strain (CARDIA)    Difficulty of Paying Living Expenses: Not hard at all  Food Insecurity: No Food Insecurity (06/24/2021)   Hunger Vital Sign    Worried About Running Out of Food in the Last Year: Never true    [place] in the Last Year: Never true  Transportation Needs: No Transportation Needs (06/24/2021)   PRAPARE - Hydrologist (Medical): No    Lack of Transportation (Non-Medical): No  Physical Activity: Inactive (06/24/2021)   Exercise Vital Sign    Days of Exercise per Week: 0 days    Minutes of Exercise per Session: 0 min  Stress: No Stress Concern Present (06/24/2021)   [place]    Feeling of Stress : Only a little  Social Connections: Socially Isolated (06/24/2021)   Social Connection and Isolation Panel [NHANES]    Frequency of Communication with Friends and Family: More than three times a week    Frequency of Social Gatherings with Friends and Family: More than three times a week    Attends Religious Services: Never    Marine scientist or Organizations: No    Attends Archivist Meetings:  Never    Marital Status: Divorced    Tobacco Counseling Counseling given: Not Answered Tobacco comments: smoking cessation materials not required   Clinical Intake:                 Diabetic?Yes         Activities of Daily Living    06/18/2022    7:44 AM 04/22/2022    9:58 AM  In your present state of health, do you have any difficulty performing the following activities:  Hearing? 0 0  Vision? 1 1  Difficulty concentrating or making decisions? 0 0  Walking or climbing stairs? 1 1  Dressing or bathing? 0 0  Doing errands, shopping? 0 0    Patient Care Team: Steele Sizer, MD as PCP - General (Family Medicine) Bryson Ha, OD as Consulting Physician (Optometry) Sharlet Salina, MD as Consulting Physician (Physical Medicine and Rehabilitation) Germaine Pomfret, [facility] as Pharmacist (Pharmacist)  Indicate any recent Medical Services you may have received from other than [facility] providers in the past year (date may be approximate).     Assessment:   This is a routine wellness examination for Amber Avery.  Hearing/Vision screen No results found.  Dietary issues and exercise activities discussed:     Goals Addressed   None   Depression Screen    06/18/2022    7:44 AM 04/22/2022    9:58 AM 12/17/2021   10:48 AM 10/07/2021    9:57 AM 09/03/2021    2:38 PM 08/22/2021    2:33 PM 08/15/2021   12:07 PM  PHQ 2/9 Scores  PHQ - 2 Score 0 0 0 0 2 0 0  PHQ- 9 Score 2 0 0 0 3 2     Fall Risk    06/18/2022    7:43 AM 04/22/2022    9:58 AM 12/17/2021   10:48 AM 10/07/2021    9:57 AM 08/15/2021   12:07 PM  Fall Risk   Falls in the past year? 0  0 0 0  Number falls in past yr: 0 0 0 0   Injury with Fall? 0 0 0 0   Risk for fall due to : No Fall Risks Impaired balance/gait No Fall Risks No Fall Risks   Follow up Falls prevention discussed Falls prevention discussed Falls prevention discussed Falls prevention discussed Falls prevention discussed    FALL RISK  PREVENTION PERTAINING TO THE HOME:  Any stairs in or around the home? No  If so, are there any without handrails? Yes  Home free of loose throw rugs in walkways, pet beds, electrical cords, etc? No  Adequate lighting in your home to reduce risk of falls? Yes   ASSISTIVE DEVICES UTILIZED TO PREVENT FALLS:  Life alert? Yes  Use of a cane, walker or w/c? Yes  Grab bars in the bathroom? Yes  Shower chair or bench in shower? Yes  Elevated toilet seat or a handicapped toilet? Yes   TIMED UP AND GO:  Was the test performed? No .  Length of time to ambulate 10 feet: N/A sec.     Cognitive Function:        06/20/2020    3:00 PM 06/20/2019    3:59 PM 06/17/2018   10:41 AM 06/14/2017    9:52 AM  6CIT Screen  What Year? 0 points 0 points 0 points 0 points  What month? 0 points 0 points 0 points 0 points  What time? 0 points 0 points 0 points 0 points  Count back from 20 0 points 0 points 0 points 0 points  Months in reverse 0 points 0 points 0 points 4 points  Repeat phrase 0 points 0 points 2 points 4 points  Total Score 0 points 0 points 2 points 8 points    Immunizations Immunization History  Administered Date(s) Administered   Fluad Quad(high Dose 65+) 06/20/2020, 06/24/2021   Influenza, High Dose Seasonal PF 06/19/2015, 06/29/2016, 06/14/2017, 06/17/2018, 06/22/2019   Influenza-Unspecified 06/17/2011, 07/11/2019   PFIZER(Purple Top)SARS-COV-2 Vaccination 12/11/2019, 01/01/2020, 07/16/2020, 02/03/2021   Pfizer Covid-19 Vaccine Bivalent Booster 54yr & up 07/03/2021   Pneumococcal Conjugate-13 11/24/2011, 11/23/2013, 04/09/2017   Pneumococcal Polysaccharide-23 08/30/2009, 08/30/2009   Td 08/30/2009   Tdap 08/30/2009, 08/12/2020   Zoster Recombinat (Shingrix) 07/11/2019, 09/21/2019   Zoster, Live 02/12/2011    TDAP status: Up to date  Flu Vaccine status: Due, Education has been provided regarding the importance of this vaccine. Advised may receive this vaccine at local  pharmacy or Health Dept. Aware to provide a copy of the vaccination record if obtained from local pharmacy or Health Dept. Verbalized acceptance and understanding.  Pneumococcal vaccine status: Due, Education has been provided regarding the importance of this vaccine. Advised may receive this vaccine at local pharmacy or Health Dept. Aware to provide a copy of the vaccination record if obtained from local pharmacy or Health Dept. Verbalized acceptance and understanding.  Covid-19 vaccine status: Completed vaccines  Qualifies for Shingles Vaccine? No   Zostavax completed No   Shingrix Completed?: Yes  Screening Tests Health Maintenance  Topic Date Due   COVID-19 Vaccine (6 - Pfizer risk series) 08/28/2021   INFLUENZA VACCINE  05/19/2022   Pneumonia Vaccine 75 Years old (3 - PPSV23 or PCV20) 12/16/2022 (Originally 04/09/2018)   FOOT EXAM  08/15/2022   OPHTHALMOLOGY EXAM  10/01/2022   HEMOGLOBIN A1C  10/23/2022   MAMMOGRAM  05/20/2023   COLONOSCOPY (Pts 45-425yrInsurance coverage will need to be confirmed)  01/20/2026   TETANUS/TDAP  08/12/2030   DEXA SCAN  Completed   Hepatitis C Screening  Completed   Zoster Vaccines- Shingrix  Completed   HPV VACCINES  Aged Out    Health Maintenance  Health Maintenance Due  Topic Date Due   COVID-19 Vaccine (6 - Pfizer risk series) 08/28/2021   INFLUENZA VACCINE  05/19/2022    Colorectal cancer screening: Type of screening: Colonoscopy. Completed 01/21/2016. Repeat every 10 years  Mammogram status: Completed 05/19/2022. Repeat every year  Bone Density status: Completed 05/19/2022. Results reflect: Bone density results: OSTEOPENIA. Repeat every 2 years.  Lung Cancer Screening: (Low Dose CT Chest recommended if Age 57-80 years, 30 pack-year currently smoking OR have quit w/in 15years.) does not qualify.   Lung Cancer Screening Referral: N/A  Additional Screening:  Hepatitis C Screening: does not qualify; Completed 07/14/2012  Vision  Screening: Recommended annual ophthalmology exams for early detection of glaucoma and other disorders of the eye. Is the patient up to date with their annual eye exam?  Yes  Who is the provider or what is the name of the office in which the patient attends annual eye exams? Dr[facility]Ritter at [facility] [place]  If pt is not established with a provider, would they like to be referred to a provider to establish care? No .   Dental Screening: Recommended annual dental exams for proper oral hygiene  Community Resource Referral / Chronic Care Management: CRR required this visit?  No   CCM required this visit?  No      Plan:     I have personally reviewed and noted the following in the patient's chart:   Medical and social history Use of alcohol, tobacco or illicit drugs  Current medications and supplements including opioid prescriptions. Patient is not currently taking opioid prescriptions. Functional ability and status Nutritional status Physical activity Advanced directives List of other physicians Hospitalizations, surgeries, and ER visits in previous 12 months Vitals Screenings to include cognitive, depression, and falls Referrals and appointments  In addition, I have reviewed and discussed with patient certain preventive protocols, quality metrics, and best practice recommendations. A written personalized care plan for preventive services as well as general preventive health recommendations were provided to patient.     Amber Avery, [place]   06/25/2022   Nurse Notes: Non face to face 35 minutes spent.   Amber Avery , Thank you for taking time to come for your Medicare Wellness Visit. I appreciate your ongoing commitment to your health goals. Please review the following plan we discussed and let me know if I can assist you in the future.   These are the goals we discussed:  Goals      Monitor and Manage My Blood Sugar-Diabetes Type 2     Timeframe:  Long-Range Goal Priority:   High Start Date: 04/04/2021                            Expected End Date: 10/04/2022                      Follow Up within 90 days    - check blood sugar at prescribed times - check blood sugar if I feel it is too high or too low - enter blood sugar readings and medication or insulin into daily log    Why is this important?   Checking your blood sugar at home helps to keep it from getting very high or very low.  Writing the results in  a diary or log helps the doctor know how to care for you.  Your blood sugar log should have the time, date and the results.  Also, write down the amount of insulin or other medicine that you take.  Other information, like what you ate, exercise done and how you were feeling, will also be helpful.     Notes:         This is a list of the screening recommended for you and due dates:  Health Maintenance  Topic Date Due   COVID-19 Vaccine (6 - Pfizer risk series) 08/28/2021   Flu Shot  05/19/2022   Pneumonia Vaccine (3 - PPSV23 or PCV20) 12/16/2022*   Complete foot exam   08/15/2022   Eye exam for diabetics  10/01/2022   Hemoglobin A1C  10/23/2022   Mammogram  05/20/2023   Colon Cancer Screening  01/20/2026   Tetanus Vaccine  08/12/2030   DEXA scan (bone density measurement)  Completed   Hepatitis C Screening: USPSTF Recommendation to screen - Ages 27-79 yo.  Completed   Zoster (Shingles) Vaccine  Completed   HPV Vaccine  Aged Out  *Topic was postponed. The date shown is not the original due date.

## 2022-07-03 ENCOUNTER — Telehealth: Payer: Medicare Other | Admitting: Family Medicine

## 2022-07-06 NOTE — Patient Instructions (Signed)
Preventive Care 65 Years and Older, Female Preventive care refers to lifestyle choices and visits with your health care provider that can promote health and wellness. Preventive care visits are also called wellness exams. What can I expect for my preventive care visit? Counseling Your health care provider may ask you questions about your: Medical history, including: Past medical problems. Family medical history. Pregnancy and menstrual history. History of falls. Current health, including: Memory and ability to understand (cognition). Emotional well-being. Home life and relationship well-being. Sexual activity and sexual health. Lifestyle, including: Alcohol, nicotine or tobacco, and drug use. Access to firearms. Diet, exercise, and sleep habits. Work and work environment. Sunscreen use. Safety issues such as seatbelt and bike helmet use. Physical exam Your health care provider will check your: Height and weight. These may be used to calculate your BMI (body mass index). BMI is a measurement that tells if you are at a healthy weight. Waist circumference. This measures the distance around your waistline. This measurement also tells if you are at a healthy weight and may help predict your risk of certain diseases, such as type 2 diabetes and high blood pressure. Heart rate and blood pressure. Body temperature. Skin for abnormal spots. What immunizations do I need?  Vaccines are usually given at various ages, according to a schedule. Your health care provider will recommend vaccines for you based on your age, medical history, and lifestyle or other factors, such as travel or where you work. What tests do I need? Screening Your health care provider may recommend screening tests for certain conditions. This may include: Lipid and cholesterol levels. Hepatitis C test. Hepatitis B test. HIV (human immunodeficiency virus) test. STI (sexually transmitted infection) testing, if you are at  risk. Lung cancer screening. Colorectal cancer screening. Diabetes screening. This is done by checking your blood sugar (glucose) after you have not eaten for a while (fasting). Mammogram. Talk with your health care provider about how often you should have regular mammograms. BRCA-related cancer screening. This may be done if you have a family history of breast, ovarian, tubal, or peritoneal cancers. Bone density scan. This is done to screen for osteoporosis. Talk with your health care provider about your test results, treatment options, and if necessary, the need for more tests. Follow these instructions at home: Eating and drinking  Eat a diet that includes fresh fruits and vegetables, whole grains, lean protein, and low-fat dairy products. Limit your intake of foods with high amounts of sugar, saturated fats, and salt. Take vitamin and mineral supplements as recommended by your health care provider. Do not drink alcohol if your health care provider tells you not to drink. If you drink alcohol: Limit how much you have to 0-1 drink a day. Know how much alcohol is in your drink. In the [place] one drink equals one 12 oz bottle of beer (355 mL), one 5 oz glass of wine (148 mL), or one 1 oz glass of hard liquor (44 mL). Lifestyle Brush your teeth every morning and night with fluoride toothpaste. Floss one time each day. Exercise for at least 30 minutes 5 or more days each week. Do not use any products that contain nicotine or tobacco. These products include cigarettes, chewing tobacco, and vaping devices, such as e-cigarettes. If you need help quitting, ask your health care provider. Do not use drugs. If you are sexually active, practice safe sex. Use a condom or other form of protection in order to prevent STIs. Take aspirin only as told by   your health care provider. Make sure that you understand how much to take and what form to take. Work with your health care provider to find out whether it  is safe and beneficial for you to take aspirin daily. Ask your health care provider if you need to take a cholesterol-lowering medicine (statin). Find healthy ways to manage stress, such as: Meditation, yoga, or listening to music. Journaling. Talking to a trusted person. Spending time with friends and family. Minimize exposure to UV radiation to reduce your risk of skin cancer. Safety Always wear your seat belt while driving or riding in a vehicle. Do not drive: If you have been drinking alcohol. Do not ride with someone who has been drinking. When you are tired or distracted. While texting. If you have been using any mind-altering substances or drugs. Wear a helmet and other protective equipment during sports activities. If you have firearms in your house, make sure you follow all gun safety procedures. What's next? Visit your health care provider once a year for an annual wellness visit. Ask your health care provider how often you should have your eyes and teeth checked. Stay up to date on all vaccines. This information is not intended to replace advice given to you by your health care provider. Make sure you discuss any questions you have with your health care provider. Document Revised: 04/02/2021 Document Reviewed: 04/02/2021 Elsevier Patient Education  2023 [employer]  

## 2022-07-06 NOTE — Progress Notes (Signed)
Name: Amber Avery   MRN: [id number]    DOB: [birth year]   Date:07/07/2022       Progress Note  Subjective  Chief Complaint  Annual Exam  HPI  Patient presents for annual CPE.  Diet: cutting down on sugar intake, cutting down on salt  Exercise: discussed 150 minutes per week.   Last Eye Exam: 07/2021 Last Dental Exam: not in a while, advised to go back   [place] from 06/24/2021 in [facility]  AUDIT-C Score 0      Depression: Phq 9 is  negative    07/07/2022    1:04 PM 06/25/2022    9:53 AM 06/18/2022    7:44 AM 04/22/2022    9:58 AM 12/17/2021   10:48 AM  Depression screen PHQ 2/9  Decreased Interest 0 0 0 0 0  Down, Depressed, Hopeless 0 0 0 0 0  PHQ - 2 Score 0 0 0 0 0  Altered sleeping 2 1 1  0 0  Tired, decreased energy 1 0 0 0 0  Change in appetite 0 1 1 0 0  Feeling bad or failure about yourself  0 0 0 0 0  Trouble concentrating 0 0 0 0 0  Moving slowly or fidgety/restless 0 0 0 0 0  Suicidal thoughts 0 0 0 0 0  PHQ-9 Score 3 2 2  0 0  Difficult doing work/chores     Not difficult at all   Hypertension: BP Readings from Last 3 Encounters:  07/07/22 132/74  06/25/22 (!) 143/82  06/18/22 (!) 140/83   Obesity: Wt Readings from Last 3 Encounters:  07/07/22 267 lb (121.1 kg)  06/25/22 266 lb (120.7 kg)  04/22/22 266 lb (120.7 kg)   BMI Readings from Last 3 Encounters:  07/07/22 50.45 kg/m  06/25/22 48.65 kg/m  04/22/22 48.65 kg/m     Vaccines:   HPV: N/A Tdap: up to date Shingrix: up to date Pneumonia: discussed PCV 20  Flu: today  COVID-19: she will have a booster in Oct  RSV: up to date     Hep C Screening: 07/14/12 STD testing and prevention (HIV/chl/gon/syphilis): N/A Intimate partner violence: negative screen  Sexual History : not sexually active Menstrual History/LMP/Abnormal Bleeding: post-menopausal two weeks ago  Discussed importance of follow up if any post-menopausal bleeding: she had some  pink discharge followed by spotting two weeks ago, no pelvic pain  Incontinence Symptoms: urinary frequency and overflow incontinence   Breast cancer:  - Last Mammogram: 05/19/22 - BRCA gene screening: N/A  Osteoporosis Prevention : Discussed high calcium and vitamin D supplementation, weight bearing exercises Bone density: 05/19/22   Cervical cancer screening: N/A  Skin cancer: Discussed monitoring for atypical lesions  Colorectal cancer: 01/21/16   Lung cancer:  Low Dose CT Chest recommended if Age 75-80 years, 20 pack-year currently smoking OR have quit w/in 15years. Patient does not qualify for screen   ECG: 03/18/11, stress test done by Dr. Clayborn Bigness  in 2016   Advanced Care Planning: A voluntary discussion about advance care planning including the explanation and discussion of advance directives.  Discussed health care proxy and Living will, and the patient was able to identify a health care proxy as her sister Marjorie Smolder .  Patient has a living will and Power of attorney of health care and we have copies in her chart   Lipids: Lab Results  Component Value Date   CHOL 158 12/17/2021   CHOL 133 12/13/2020  CHOL 141 12/14/2019   Lab Results  Component Value Date   HDL 53 12/17/2021   HDL 52 12/13/2020   HDL 52 12/14/2019   Lab Results  Component Value Date   LDLCALC 83 12/17/2021   LDLCALC 60 12/13/2020   LDLCALC 67 12/14/2019   Lab Results  Component Value Date   TRIG 128 12/17/2021   TRIG 130 12/13/2020   TRIG 141 12/14/2019   Lab Results  Component Value Date   CHOLHDL 3.0 12/17/2021   CHOLHDL 2.6 12/13/2020   CHOLHDL 2.7 12/14/2019   No results found for: "LDLDIRECT"  Glucose: Glucose, Bld  Date Value Ref Range Status  12/17/2021 115 (H) 65 - 99 mg/dL Final    Comment:    .            Fasting reference interval . For someone without known diabetes, a glucose value between 100 and 125 mg/dL is consistent with prediabetes and should be confirmed with  a follow-up test. .   12/13/2020 110 (H) 65 - 99 mg/dL Final    Comment:    .            Fasting reference interval . For someone without known diabetes, a glucose value between 100 and 125 mg/dL is consistent with prediabetes and should be confirmed with a follow-up test. .   12/14/2019 119 (H) 65 - 99 mg/dL Final    Comment:    .            Fasting reference interval . For someone without known diabetes, a glucose value between 100 and 125 mg/dL is consistent with prediabetes and should be confirmed with a follow-up test. .    Glucose-Capillary  Date Value Ref Range Status  12/21/2021 135 (H) 70 - 99 mg/dL Final    Comment:    Glucose reference range applies only to samples taken after fasting for at least 8 hours.  09/04/2020 121 (H) 70 - 99 mg/dL Final    Comment:    Glucose reference range applies only to samples taken after fasting for at least 8 hours.  09/04/2020 122 (H) 70 - 99 mg/dL Final    Comment:    Glucose reference range applies only to samples taken after fasting for at least 8 hours.    Patient Active Problem List   Diagnosis Date Noted   Greater trochanteric bursitis, left 06/18/2022   Gait instability 12/13/2020   Stage 3a chronic kidney disease ([place]) 12/13/2020   Cellulitis of right ankle 04/12/2020   Morbid obesity with BMI of 45.0-49.9, adult ([place]) 02/06/2020   History of gastritis 08/14/2019   Angiodysplasia of stomach and duodenum    Iron deficiency anemia 04/20/2018   Anemia, unspecified 11/94/1740   Lichen sclerosus of female genitalia 06/29/2016   Primary osteoarthritis of both knees 04/07/2016   Special screening for malignant neoplasms, colon    Right thyroid nodule 06/26/2015   Asthma, intermittent 04/03/2015   Benign essential HTN 04/03/2015   Carpal tunnel syndrome 04/03/2015   Chronic kidney disease (CKD), stage II (mild) 04/03/2015   Controlled gout 04/03/2015   Arteriosclerosis of coronary artery 04/03/2015   Diabetes  mellitus with renal manifestations, controlled ([place]) 04/03/2015   Dyslipidemia 04/03/2015   Edema extremities 04/03/2015   Elevated sedimentation rate 04/03/2015   Knee pain 04/03/2015   Lumbar radiculitis 04/03/2015   Obstructive apnea 04/03/2015   Lumbosacral spondylosis 04/03/2015   Osteoarthritis, chronic 04/03/2015   Psoriasis 04/03/2015   Vitamin D deficiency 04/03/2015  Varicose veins 04/03/2015   Goiter 12/23/2014   Degeneration of intervertebral disc of lumbar region 08/23/2014   Corns and callosity 06/11/2009    Past Surgical History:  Procedure Laterality Date   APPENDECTOMY     CATARACT EXTRACTION W/PHACO Left 08/14/2020   Procedure: CATARACT EXTRACTION PHACO AND INTRAOCULAR LENS PLACEMENT ([facility]) LEFT DIABETIC 3.56 00:59.6 6.0%;  Surgeon: Leandrew Koyanagi, MD;  Location: [facility];  Service: Ophthalmology;  Laterality: Left;  Diabetic   CATARACT EXTRACTION W/PHACO Right 09/04/2020   Procedure: CATARACT EXTRACTION PHACO AND INTRAOCULAR LENS PLACEMENT ([place]) RIGHT DIABETIC;  Surgeon: Leandrew Koyanagi, MD;  Location: [facility];  Service: Ophthalmology;  Laterality: Right;  4.72 1:03.2 7.4%   CHOLECYSTECTOMY  1977   COLONOSCOPY WITH PROPOFOL N/A 01/21/2016   Procedure: COLONOSCOPY WITH PROPOFOL;  Surgeon: Lucilla Lame, MD;  Location: [facility];  Service: Endoscopy;  Laterality: N/A;   DILATION AND CURETTAGE OF UTERUS     Due to Amenorrhea   ESOPHAGOGASTRODUODENOSCOPY (EGD) WITH PROPOFOL N/A 08/16/2018   Procedure: ESOPHAGOGASTRODUODENOSCOPY (EGD) WITH PROPOFOL;  Surgeon: Lucilla Lame, MD;  Location: [facility];  Service: Endoscopy;  Laterality: N/A;   [place]     HERNIA REPAIR  2011   Temporal Area Excision Biopsy     For Birth Mark Changes, Negative Pathology   TONSILLECTOMY AND ADENOIDECTOMY      Family History  Problem Relation Age of Onset   Cancer Mother        brain tumor   Kidney disease  Mother    Hypertension Mother    Heart disease Father    COPD Father    Hypertension Sister    Arthritis Sister    Cancer Sister    Heart murmur Sister    GER disease Sister    Lupus Sister    Diabetes Sister    Arthritis Sister    Osteopenia Sister    Heart disease Sister    Hypertension Sister    Breast cancer Maternal Aunt 38   Leukemia Maternal Aunt     Social History   Socioeconomic History   Marital status: Divorced    Spouse name: Not on file   Number of children: 0   Years of education: Not on file   Highest education level: 12th grade  Occupational History   Occupation: Retired  Tobacco Use   Smoking status: Former    Packs/day: 2.00    Years: 15.00    Total pack years: 30.00    Types: Cigarettes    Quit date: 1983    Years since quitting: 40.7   Smokeless tobacco: Never   Tobacco comments:    smoking cessation materials not required  Vaping Use   Vaping Use: Never used  Substance and Sexual Activity   Alcohol use: Not Currently    Alcohol/week: 0.0 standard drinks of alcohol   Drug use: No   Sexual activity: Not Currently  Other Topics Concern   Not on file  Social History Narrative   Patient just moved in with her youngest sister and a roommate    Social Determinants of Health   Financial Resource Strain: Low Risk  (07/07/2022)   Overall Financial Resource Strain (CARDIA)    Difficulty of Paying Living Expenses: Not very hard  Food Insecurity: No Food Insecurity (07/07/2022)   Hunger Vital Sign    Worried About Running Out of Food in the Last Year: Never true    Ran Out of Food in the  Last Year: Never true  Transportation Needs: No Transportation Needs (06/24/2021)   PRAPARE - Hydrologist (Medical): No    Lack of Transportation (Non-Medical): No  Physical Activity: Insufficiently Active (07/07/2022)   Exercise Vital Sign    Days of Exercise per Week: 2 days    Minutes of Exercise per Session: 20 min  Stress: No  Stress Concern Present (07/07/2022)   [place]    Feeling of Stress : Not at all  Social Connections: Socially Isolated (07/07/2022)   Social Connection and Isolation Panel [NHANES]    Frequency of Communication with Friends and Family: Once a week    Frequency of Social Gatherings with Friends and Family: Once a week    Attends Religious Services: Never    Marine scientist or Organizations: No    Attends Archivist Meetings: Never    Marital Status: Divorced  Human resources officer Violence: Not At Risk (07/07/2022)   Humiliation, Afraid, Rape, and Kick questionnaire    Fear of Current or Ex-Partner: No    Emotionally Abused: No    Physically Abused: No    Sexually Abused: No     Current Outpatient Medications:    acetaminophen (TYLENOL) 500 MG tablet, Take 1 tablet (500 mg total) by mouth every 6 (six) hours as needed. (Patient taking differently: Take 1,000 mg by mouth every 8 (eight) hours.), Disp: 480 tablet, Rfl: 0   albuterol (VENTOLIN HFA) 108 (90 Base) MCG/ACT inhaler, Inhale 2 puffs into the lungs every 6 (six) hours as needed for wheezing or shortness of breath., Disp: 8 g, Rfl: 0   allopurinol (ZYLOPRIM) 300 MG tablet, TAKE 1 TABLET BY MOUTH DAILY, Disp: 90 tablet, Rfl: 3   atorvastatin (LIPITOR) 20 MG tablet, Take 1 tablet (20 mg total) by mouth daily. Daily for cholesterol, Disp: 90 tablet, Rfl: 0   benazepril (LOTENSIN) 5 MG tablet, TAKE 1 TABLET BY MOUTH DAILY, Disp: 90 tablet, Rfl: 3   diclofenac Sodium (VOLTAREN) 1 % GEL, APPLY 4 GRAMS TOPICALLY FOUR TIMES DAILY AS DIRECTED GENERIC EQUIVALENT FOR VOL.TAREN, Disp: 300 g, Rfl: 1   Dulaglutide (TRULICITY) 1.5 WU/9.8JX SOPN, Inject 1.5 mg into the skin once a week., Disp: 12 pen, Rfl: 3   furosemide (LASIX) 40 MG tablet, TAKE 1 TABLET BY MOUTH DAILY, Disp: 90 tablet, Rfl: 3   gabapentin (NEURONTIN) 300 MG capsule, TAKE 1 CAPSULE BY MOUTH IN THE  MORNING, 1 CAPSULE IN THE EVENING AND 2 CAPSULES AT NIGHT, Disp: 360 capsule, Rfl: 3   glucose blood (ONETOUCH VERIO) test strip, USE AS DIRECTED, Disp: 50 each, Rfl: 1   Lancets (ONETOUCH ULTRASOFT) lancets, Use as instructed, Disp: 100 each, Rfl: 12   methocarbamol (ROBAXIN) 500 MG tablet, Take 1 tablet (500 mg total) by mouth every 6 (six) hours as needed., Disp: 15 tablet, Rfl: 0   potassium chloride SA (KLOR-CON M) 20 MEQ tablet, TAKE 1 TABLET BY MOUTH DAILY, Disp: 90 tablet, Rfl: 3   traMADol (ULTRAM) 50 MG tablet, Take 25-50 mg by mouth 2 (two) times daily as needed., Disp: , Rfl:    triamcinolone ointment (KENALOG) 0.1 %, APPLY TOPICALLY TWICE DAILY AS NEEDED. GENERIC EQUIVALENT FOR KENALOG, Disp: 90 g, Rfl: 0   vitamin B-12 (CYANOCOBALAMIN) 500 MCG tablet, Take 500 mcg by mouth daily., Disp: , Rfl:    Vitamin D, Cholecalciferol, 25 MCG (1000 UT) TABS, Take 1 capsule by mouth daily., Disp: 90  tablet, Rfl: 1  Allergies  Allergen Reactions   Codeine Hives and Nausea And Vomiting     ROS  Constitutional: Negative for fever or weight change.  Respiratory: Negative for cough and shortness of breath.   Cardiovascular: Negative for chest pain or palpitations.  Gastrointestinal: Negative for abdominal pain, no bowel changes.  Musculoskeletal: positive or gait problem and  joint swelling.  Skin: Negative for rash.  Neurological: Negative for dizziness or headache.  No other specific complaints in a complete review of systems (except as listed in HPI above).   Objective  Vitals:   07/07/22 1305  BP: 132/74  Pulse: 94  Resp: 16  SpO2: 96%  Weight: 267 lb (121.1 kg)  Height: 5' 1"  (1.549 m)    Body mass index is 50.45 kg/m.  Physical Exam  Constitutional: Patient appears well-developed and well-nourished. No distress.  HENT: Head: Normocephalic and atraumatic. Ears: B TMs ok, no erythema or effusion; Nose: Nose normal. Mouth/Throat: Oropharynx is clear and moist. No  oropharyngeal exudate.  Eyes: Conjunctivae and EOM are normal. Pupils are equal, round, and reactive to light. No scleral icterus.  Neck: Normal range of motion. Neck supple. No JVD present. No thyromegaly present.  Cardiovascular: Normal rate, regular rhythm and normal heart sounds.  No murmur heard. No BLE edema. Pulmonary/Chest: Effort normal and breath sounds normal. No respiratory distress. Abdominal: Soft. Bowel sounds are normal, no distension. There is no tenderness. no masses Breast: no lumps or masses, no nipple discharge or rashes FEMALE GENITALIA:  Not done  RECTAL: not done  Musculoskeletal: pain during palpation of left trochanteric bursa, using a walker  Neurological: he is alert and oriented to person, place, and time. No cranial nerve deficit. Coordination, balance, strength, speech and gait are normal.  Skin: Skin senile purpura on left arm Psychiatric: Patient has a normal mood and affect. behavior is normal. Judgment and thought content normal.   Recent Results (from the past 2160 hour(s))  POCT HgB A1C     Status: Abnormal   Collection Time: 04/22/22  9:59 AM  Result Value Ref Range   Hemoglobin A1C 6.4 (A) 4.0 - 5.6 %   HbA1c POC (<> result, manual entry)     HbA1c, POC (prediabetic range)     HbA1c, POC (controlled diabetic range)       Fall Risk:    07/07/2022    1:04 PM 06/25/2022    9:53 AM 06/18/2022    7:43 AM 04/22/2022    9:58 AM 12/17/2021   10:48 AM  Fall Risk   Falls in the past year? 0 0 0  0  Number falls in past yr: 0  0 0 0  Injury with Fall? 0  0 0 0  Risk for fall due to : No Fall Risks  No Fall Risks Impaired balance/gait No Fall Risks  Follow up Falls prevention discussed Education provided;Falls prevention discussed;Falls evaluation completed Falls prevention discussed Falls prevention discussed Falls prevention discussed     Functional Status Survey: Is the patient deaf or have difficulty hearing?: No Does the patient have difficulty  seeing, even when wearing glasses/contacts?: No Does the patient have difficulty concentrating, remembering, or making decisions?: No Does the patient have difficulty walking or climbing stairs?: Yes Does the patient have difficulty dressing or bathing?: No Does the patient have difficulty doing errands alone such as visiting a doctor's office or shopping?: No   Assessment & Plan  1. Well adult exam  Increase physical activity as  tolerated   2. Need for immunization against influenza  - Flu Vaccine QUAD High Dose(Fluad)  3. Postural urinary incontinence  - Ambulatory referral to Urology  4. Post-menopausal bleeding  - Ambulatory referral to Obstetrics / Gynecology    -USPSTF grade A and B recommendations reviewed with patient; age-appropriate recommendations, preventive care, screening tests, etc discussed and encouraged; healthy living encouraged; see AVS for patient education given to patient -Discussed importance of 150 minutes of physical activity weekly, eat two servings of fish weekly, eat one serving of tree nuts ( cashews, pistachios, pecans, almonds.[name] Kitchen) every other day, eat 6 servings of fruit/vegetables daily and drink plenty of water and avoid sweet beverages.   -Reviewed Health Maintenance: Yes.

## 2022-07-07 ENCOUNTER — Encounter: Payer: Self-pay | Admitting: Family Medicine

## 2022-07-07 ENCOUNTER — Ambulatory Visit (INDEPENDENT_AMBULATORY_CARE_PROVIDER_SITE_OTHER): Payer: Medicare Other | Admitting: Family Medicine

## 2022-07-07 ENCOUNTER — Other Ambulatory Visit: Payer: Self-pay

## 2022-07-07 VITALS — BP 132/74 | HR 94 | Resp 16 | Ht 61.0 in | Wt 267.0 lb

## 2022-07-07 DIAGNOSIS — N950 Postmenopausal bleeding: Secondary | ICD-10-CM | POA: Diagnosis not present

## 2022-07-07 DIAGNOSIS — N39492 Postural (urinary) incontinence: Secondary | ICD-10-CM | POA: Diagnosis not present

## 2022-07-07 DIAGNOSIS — Z0000 Encounter for general adult medical examination without abnormal findings: Secondary | ICD-10-CM | POA: Diagnosis not present

## 2022-07-07 DIAGNOSIS — Z23 Encounter for immunization: Secondary | ICD-10-CM

## 2022-07-07 MED ORDER — ONETOUCH ULTRASOFT LANCETS MISC: 12 refills | Status: AC

## 2022-07-07 MED ORDER — ONETOUCH VERIO VI STRP: ORAL_STRIP | 1 refills | Status: DC

## 2022-07-07 MED ORDER — BLOOD PRESSURE KIT: 1.0000 | PACK | Freq: Every day | 0 refills | Status: AC

## 2022-07-13 ENCOUNTER — Encounter: Payer: Self-pay | Admitting: Obstetrics & Gynecology

## 2022-07-13 ENCOUNTER — Ambulatory Visit: Payer: Medicare Other | Admitting: Obstetrics & Gynecology

## 2022-07-13 VITALS — BP 112/62 | Wt 263.6 lb

## 2022-07-13 DIAGNOSIS — B372 Candidiasis of skin and nail: Secondary | ICD-10-CM

## 2022-07-13 DIAGNOSIS — N950 Postmenopausal bleeding: Secondary | ICD-10-CM | POA: Diagnosis not present

## 2022-07-13 MED ORDER — NYSTATIN 100000 UNIT/GM EX CREA: 1.0000 | TOPICAL_CREAM | Freq: Two times a day (BID) | CUTANEOUS | 3 refills | Status: DC

## 2022-07-13 NOTE — Progress Notes (Signed)
Subjective:     Amber Avery is a 76 y.o.  G0P0000 LMP age 89  here for a routine exam.  Current complaints: Pt c/o 1 episode of vaginal bleeding which occurred 3 weeks ago.   Not associated with pain or cramping. Personal health questionnaire reviewed: yes.   Gynecologic History No LMP recorded. Patient is postmenopausal. Contraception: post menopausal status Last Pap: 03-2011. Results were: normal Last mammogram: 05-2022. Results were: normal  Obstetric History OB History  No obstetric history on file.     The following portions of the patient's history were reviewed and updated as appropriate: allergies, current medications, past family history, past medical history, past social history, past surgical history, and problem list.  Review of Systems Pertinent items noted in HPI and remainder of comprehensive ROS otherwise negative.    Objective:    BP 112/62   Wt 263 lb 9.6 oz (119.6 kg)   BMI 49.81 kg/m  General appearance: alert, cooperative, no distress, and morbidly obese Breasts: normal appearance, no masses or tenderness Abdomen: normal findings: no organomegaly, soft, non-tender, and obese Pelvic: cervix normal in appearance, external genitalia normal, no adnexal masses or tenderness, no cervical motion tenderness, uterus normal size, shape, and consistency, and vagina normal without discharge Extremities: edema 1+ bilat Skin: Skin color, texture, turgor normal. No rashes or lesions    Assessment:  PMB-post menopausal bleeding.    Plan:    Follow up in: 4 weeks. F/u after completing pelvic US

## 2022-07-17 DIAGNOSIS — J449 Chronic obstructive pulmonary disease, unspecified: Secondary | ICD-10-CM | POA: Diagnosis not present

## 2022-07-22 ENCOUNTER — Ambulatory Visit: Payer: Medicare Other | Admitting: Urology

## 2022-07-22 VITALS — BP 145/73 | HR 91 | Ht 61.0 in | Wt 265.8 lb

## 2022-07-22 DIAGNOSIS — N3941 Urge incontinence: Secondary | ICD-10-CM | POA: Diagnosis not present

## 2022-07-22 DIAGNOSIS — R32 Unspecified urinary incontinence: Secondary | ICD-10-CM | POA: Diagnosis not present

## 2022-07-22 LAB — BLADDER SCAN AMB NON-IMAGING: Scan Result: 31

## 2022-07-22 MED ORDER — OXYBUTYNIN CHLORIDE ER 15 MG PO TB24: 15.0000 mg | ORAL_TABLET | Freq: Every day | ORAL | 11 refills | Status: DC

## 2022-07-22 NOTE — Progress Notes (Signed)
07/22/2022 3:03 PM   Amber Avery [birth year] [id number]  Referring provider: Steele Sizer, MD [address] [place],  [place] [address]  Chief Complaint  Patient presents with   New Patient (Initial Visit)   Urinary Incontinence    HPI: 76 year old female who presents today for further evaluation of urinary incontinence.  She has multiple risk factors for incontinence including morbid obesity, diabetes, lower extremity edema on Lasix, sleep apnea amongst others.  She reports that she has a longstanding history of urgency frequency and over the past 6 months, worsening urge incontinence.  She gets up 4 times at night to urinate which is really bothersome to her.  She normally wakes up because of hip or knee pain but when she does, she does not get up and go ahead and go to the bathroom, she will have an episode of urge incontinence when her feet hit the floor the next time she wakes up.  This is causing fatigue.  She also has daytime urgency frequency and incontinence.  She occasionally has leakage with laughing coughing and sneezing but only if she has a full bladder at that time.  She is seeing an OB/GYN for history of what sounds like endometrial polyps.  She had a D&C back in 2017.  She is undergoing a pelvic ultrasound in the near future for further work-up for this.  She otherwise has no signs or symptoms of pelvic organ prolapse.  She denies any history of constipation or diarrhea.  Primarily drinks water or watered-down tea.  She drinks a cup and a half of coffee in the morning.  Once a month she will have a diet [facility].  She is never tried any OAB medications.  Results for orders placed or performed in visit on 07/22/22  Bladder Scan (Post Void Residual) in office  Result Value Ref Range   Scan Result 31 ml      PMH: Past Medical History:  Diagnosis Date   Asthma    Diabetes mellitus without complication ([place])    Gout    Hyperlipidemia     Hypertension    Iron deficiency anemia 04/20/2018   PONV (postoperative nausea and vomiting)    after hemorroid surgery   Shortness of breath dyspnea    Sleep apnea     Surgical History: Past Surgical History:  Procedure Laterality Date   APPENDECTOMY     CATARACT EXTRACTION W/PHACO Left 08/14/2020   Procedure: CATARACT EXTRACTION PHACO AND INTRAOCULAR LENS PLACEMENT ([facility]) LEFT DIABETIC 3.56 00:59.6 6.0%;  Surgeon: Leandrew Koyanagi, MD;  Location: [facility];  Service: Ophthalmology;  Laterality: Left;  Diabetic   CATARACT EXTRACTION W/PHACO Right 09/04/2020   Procedure: CATARACT EXTRACTION PHACO AND INTRAOCULAR LENS PLACEMENT ([place]) RIGHT DIABETIC;  Surgeon: Leandrew Koyanagi, MD;  Location: [facility];  Service: Ophthalmology;  Laterality: Right;  4.72 1:03.2 7.4%   CHOLECYSTECTOMY  1977   COLONOSCOPY WITH PROPOFOL N/A 01/21/2016   Procedure: COLONOSCOPY WITH PROPOFOL;  Surgeon: Lucilla Lame, MD;  Location: [facility];  Service: Endoscopy;  Laterality: N/A;   DILATION AND CURETTAGE OF UTERUS     Due to Amenorrhea   ESOPHAGOGASTRODUODENOSCOPY (EGD) WITH PROPOFOL N/A 08/16/2018   Procedure: ESOPHAGOGASTRODUODENOSCOPY (EGD) WITH PROPOFOL;  Surgeon: Lucilla Lame, MD;  Location: [facility];  Service: Endoscopy;  Laterality: N/A;   HEMORRHOID BANDING  1980   HEMORROIDECTOMY     HERNIA REPAIR  2011   Temporal Area Excision Biopsy     For Birth [place]  Changes, Negative Pathology   TONSILLECTOMY AND ADENOIDECTOMY      Home Medications:  Allergies as of 07/22/2022       Reactions   Codeine Hives, Nausea And Vomiting        Medication List        Accurate as of July 22, 2022  3:03 PM. If you have any questions, ask your nurse or doctor.          acetaminophen 500 MG tablet Commonly known as: TYLENOL Take 1 tablet (500 mg total) by mouth every 6 (six) hours as needed. What changed:  how much to take when to take this   albuterol 108 (90 Base)  MCG/ACT inhaler Commonly known as: VENTOLIN HFA Inhale 2 puffs into the lungs every 6 (six) hours as needed for wheezing or shortness of breath.   allopurinol 300 MG tablet Commonly known as: ZYLOPRIM TAKE 1 TABLET BY MOUTH DAILY   atorvastatin 20 MG tablet Commonly known as: LIPITOR Take 1 tablet (20 mg total) by mouth daily. Daily for cholesterol   benazepril 5 MG tablet Commonly known as: LOTENSIN TAKE 1 TABLET BY MOUTH DAILY   Blood Pressure Kit 1 each by Does not apply route daily.   diclofenac Sodium 1 % Gel Commonly known as: VOLTAREN APPLY 4 GRAMS TOPICALLY FOUR TIMES DAILY AS DIRECTED GENERIC EQUIVALENT FOR VOL.TAREN   furosemide 40 MG tablet Commonly known as: LASIX TAKE 1 TABLET BY MOUTH DAILY   gabapentin 300 MG capsule Commonly known as: NEURONTIN TAKE 1 CAPSULE BY MOUTH IN THE MORNING, 1 CAPSULE IN THE EVENING AND 2 CAPSULES AT NIGHT   methocarbamol 500 MG tablet Commonly known as: Robaxin Take 1 tablet (500 mg total) by mouth every 6 (six) hours as needed.   nystatin cream Commonly known as: MYCOSTATIN Apply 1 Application topically 2 (two) times daily.   onetouch ultrasoft lancets Use as instructed   OneTouch Verio test strip Generic drug: glucose blood USE AS DIRECTED   oxybutynin 15 MG 24 hr tablet Commonly known as: DITROPAN XL Take 1 tablet (15 mg total) by mouth daily.   potassium chloride SA 20 MEQ tablet Commonly known as: KLOR-CON M TAKE 1 TABLET BY MOUTH DAILY   traMADol 50 MG tablet Commonly known as: ULTRAM Take 25-50 mg by mouth 2 (two) times daily as needed.   triamcinolone ointment 0.1 % Commonly known as: KENALOG APPLY TOPICALLY TWICE DAILY AS NEEDED. GENERIC EQUIVALENT FOR KENALOG   Trulicity 1.5 YY/4.8GN Sopn Generic drug: Dulaglutide Inject 1.5 mg into the skin once a week.   vitamin B-12 500 MCG tablet Commonly known as: CYANOCOBALAMIN Take 500 mcg by mouth daily.   Vitamin D (Cholecalciferol) 25 MCG (1000 UT)  Tabs Take 1 capsule by mouth daily.        Allergies:  Allergies  Allergen Reactions   Codeine Hives and Nausea And Vomiting    Family History: Family History  Problem Relation Age of Onset   Cancer Mother        brain tumor   Kidney disease Mother    Hypertension Mother    Heart disease Father    COPD Father    Hypertension Sister    Arthritis Sister    Cancer Sister    Heart murmur Sister    GER disease Sister    Lupus Sister    Diabetes Sister    Arthritis Sister    Osteopenia Sister    Heart disease Sister    Hypertension Sister  Breast cancer Maternal Aunt 25   Leukemia Maternal Aunt     Social History:  reports that she quit smoking about 40 years ago. Her smoking use included cigarettes. She has a 30.00 pack-year smoking history. She has never used smokeless tobacco. She reports that she does not currently use alcohol. She reports that she does not use drugs.   Physical Exam: BP (!) 145/73   Pulse 91   Ht 5' 1"  (1.549 m)   Wt 265 lb 12.8 oz (120.6 kg)   BMI 50.22 kg/m   Constitutional:  Alert and oriented, No acute distress. HEENT: [place] AT, moist mucus membranes.  Trachea midline, no masses. Cardiovascular: +LE edema Respiratory: Normal respiratory effort, no increased work of breathing. GI: Abd obese Skin: No rashes, bruises or suspicious lesions. Neurologic: Grossly intact, no focal deficits, moving all 4 extremities. Psychiatric: Normal mood and affect.  Laboratory Data: Lab Results  Component Value Date   WBC 6.9 12/17/2021   HGB 12.7 12/17/2021   HCT 38.0 12/17/2021   MCV 97.2 12/17/2021   PLT 220 12/17/2021    Lab Results  Component Value Date   CREATININE 1.28 (H) 12/17/2021    Lab Results  Component Value Date   HGBA1C 6.4 (A) 04/22/2022    Urinalysis UA today is negative  Pertinent Imaging: Results for orders placed or performed in visit on 07/22/22  Bladder Scan (Post Void Residual) in office  Result Value Ref Range    Scan Result 31 ml     Assessment & Plan:    1. Urge incontinence Severe urge incontinence, worsening  Urinalysis today is negative and she is emptying her bladder well, no evidence of underlying pathology  She has multiple risk factors for this  We discussed the pathway for urge incontinence which primarily includes behavioral intervention followed by medications followed by second and third line therapies.  She would like to try oxybutynin we will start with a higher dose 15 mg XL.  We discussed possible side effects including dry eyes, dry mouth and constipation. - Urinalysis, Complete - Bladder Scan (Post Void Residual) in office   Return in about 6 weeks (around 09/02/2022) for PVR on PA schedule.  Hollice Espy, MD  [facility] [address], [place], [place] [address] [phone]

## 2022-07-23 ENCOUNTER — Other Ambulatory Visit: Payer: Self-pay

## 2022-07-23 ENCOUNTER — Ambulatory Visit
Admission: RE | Admit: 2022-07-23 | Discharge: 2022-07-23 | Disposition: A | Discharge status: Home / Self Care | Payer: Medicare Other | Source: Ambulatory Visit | Attending: Obstetrics & Gynecology | Admitting: Obstetrics & Gynecology

## 2022-07-23 DIAGNOSIS — N854 Malposition of uterus: Secondary | ICD-10-CM | POA: Diagnosis not present

## 2022-07-23 DIAGNOSIS — N950 Postmenopausal bleeding: Secondary | ICD-10-CM | POA: Diagnosis present

## 2022-07-23 DIAGNOSIS — R9389 Abnormal findings on diagnostic imaging of other specified body structures: Secondary | ICD-10-CM | POA: Diagnosis not present

## 2022-07-23 DIAGNOSIS — E785 Hyperlipidemia, unspecified: Secondary | ICD-10-CM

## 2022-07-23 DIAGNOSIS — D259 Leiomyoma of uterus, unspecified: Secondary | ICD-10-CM | POA: Diagnosis not present

## 2022-07-23 LAB — URINALYSIS, COMPLETE
Bilirubin, UA: NEGATIVE
Glucose, UA: NEGATIVE
Ketones, UA: NEGATIVE
Leukocytes,UA: NEGATIVE
Nitrite, UA: NEGATIVE
Protein,UA: NEGATIVE
RBC, UA: NEGATIVE
Specific Gravity, UA: <1.005 — ABNORMAL LOW (ref 1.005–1.030)
Urobilinogen, Ur: 0.2 mg/dL (ref 0.2–1.0)
pH, UA: 5.5 (ref 5.0–7.5)

## 2022-07-23 LAB — MICROSCOPIC EXAMINATION

## 2022-07-24 ENCOUNTER — Telehealth: Payer: Self-pay

## 2022-07-24 MED ORDER — ATORVASTATIN CALCIUM 20 MG PO TABS: 20.0000 mg | ORAL_TABLET | Freq: Every day | ORAL | 0 refills | Status: DC

## 2022-07-24 NOTE — Telephone Encounter (Signed)
Radiology called to have the Ultrasound results flagged. Amber Avery is not in the office anymore. Can you look at these results.

## 2022-07-24 NOTE — Telephone Encounter (Signed)
Called pt and left voice message to return call

## 2022-07-24 NOTE — Telephone Encounter (Signed)
I have reviewed it. Please inform patient that the lining of her uterus was thickened which is not normal for someone in menopause. She will need to come in and have an endometrial biopsy performed.  Can schedule with MDs in the next several weeks.

## 2022-07-24 NOTE — Telephone Encounter (Signed)
Pt aware.

## 2022-08-12 ENCOUNTER — Ambulatory Visit: Payer: Medicare Other | Admitting: Obstetrics and Gynecology

## 2022-08-12 ENCOUNTER — Encounter: Payer: Self-pay | Admitting: Obstetrics and Gynecology

## 2022-08-12 ENCOUNTER — Other Ambulatory Visit (HOSPITAL_COMMUNITY)
Admission: RE | Admit: 2022-08-12 | Discharge: 2022-08-12 | Disposition: A | Discharge status: Home / Self Care | Payer: Medicare Other | Source: Ambulatory Visit | Attending: Obstetrics and Gynecology | Admitting: Obstetrics and Gynecology

## 2022-08-12 VITALS — BP 116/50 | HR 79 | Resp 16 | Ht 61.0 in | Wt 267.0 lb

## 2022-08-12 DIAGNOSIS — N841 Polyp of cervix uteri: Secondary | ICD-10-CM | POA: Diagnosis present

## 2022-08-12 DIAGNOSIS — N950 Postmenopausal bleeding: Secondary | ICD-10-CM

## 2022-08-12 DIAGNOSIS — N904 Leukoplakia of vulva: Secondary | ICD-10-CM | POA: Diagnosis not present

## 2022-08-12 DIAGNOSIS — R9389 Abnormal findings on diagnostic imaging of other specified body structures: Secondary | ICD-10-CM

## 2022-08-12 DIAGNOSIS — N858 Other specified noninflammatory disorders of uterus: Secondary | ICD-10-CM | POA: Diagnosis not present

## 2022-08-12 DIAGNOSIS — D259 Leiomyoma of uterus, unspecified: Secondary | ICD-10-CM

## 2022-08-12 NOTE — Progress Notes (Signed)
GYNECOLOGY PROGRESS NOTE  Subjective:    Patient ID: Amber Avery, female    DOB: 06-26-1946, 76 y.o.   MRN: [id number]  HPI  Patient is a 77 y.o. P0 female who presents for follow up after ultrasound for PMB.  Last seen by Dr. Langley Gauss on 07/13/2022. At that time, patient complained of 1 episode of PMB in early September that lasted 2 days, noticed spotting when wiping on first day, followed by light bleeding (old brown blood).  Denies any further issues since that time.  She is not sexually active.   The following portions of the patient's history were reviewed and updated as appropriate:  She  has a past medical history of Asthma, Diabetes mellitus without complication ([place]), Gout, Hyperlipidemia, Hypertension, Iron deficiency anemia (04/20/2018), PONV (postoperative nausea and vomiting), Shortness of breath dyspnea, and Sleep apnea.  She  has a past surgical history that includes Cholecystectomy (1977); Hemorrhoid banding (1980); Hernia repair (2011); Appendectomy; Tonsillectomy and adenoidectomy; Dilation and curettage of uterus; Hemorroidectomy; Temporal Area Excision Biopsy; Colonoscopy with propofol (N/A, 01/21/2016); Esophagogastroduodenoscopy (egd) with propofol (N/A, 08/16/2018); Cataract extraction w/PHACO (Left, 08/14/2020); and Cataract extraction w/PHACO (Right, 09/04/2020).  Her family history includes Arthritis in her sister and sister; Breast cancer (age of onset: 18) in her maternal aunt; COPD in her father; Cancer in her mother and sister; Diabetes in her sister; GER disease in her sister; Heart disease in her father and sister; Heart murmur in her sister; Hypertension in her mother, sister, and sister; Kidney disease in her mother; Leukemia in her maternal aunt; Lupus in her sister; Osteopenia in her sister.  She  reports that she quit smoking about 40 years ago. Her smoking use included cigarettes. She has a 30.00 pack-year smoking history. She has never used smokeless  tobacco. She reports that she does not currently use alcohol. She reports that she does not use drugs.   Current Outpatient Medications on File Prior to Visit  Medication Sig Dispense Refill   acetaminophen (TYLENOL) 500 MG tablet Take 1 tablet (500 mg total) by mouth every 6 (six) hours as needed. (Patient taking differently: Take 1,000 mg by mouth every 8 (eight) hours.) 480 tablet 0   albuterol (VENTOLIN HFA) 108 (90 Base) MCG/ACT inhaler Inhale 2 puffs into the lungs every 6 (six) hours as needed for wheezing or shortness of breath. 8 g 0   allopurinol (ZYLOPRIM) 300 MG tablet TAKE 1 TABLET BY MOUTH DAILY 90 tablet 3   atorvastatin (LIPITOR) 20 MG tablet Take 1 tablet (20 mg total) by mouth daily. Daily for cholesterol 90 tablet 0   benazepril (LOTENSIN) 5 MG tablet TAKE 1 TABLET BY MOUTH DAILY 90 tablet 3   Blood Pressure KIT 1 each by Does not apply route daily. 1 kit 0   diclofenac Sodium (VOLTAREN) 1 % GEL APPLY 4 GRAMS TOPICALLY FOUR TIMES DAILY AS DIRECTED GENERIC EQUIVALENT FOR VOL.TAREN 300 g 1   Dulaglutide (TRULICITY) 1.5 PJ/8.2NK SOPN Inject 1.5 mg into the skin once a week. 12 pen 3   furosemide (LASIX) 40 MG tablet TAKE 1 TABLET BY MOUTH DAILY 90 tablet 3   gabapentin (NEURONTIN) 300 MG capsule TAKE 1 CAPSULE BY MOUTH IN THE MORNING, 1 CAPSULE IN THE EVENING AND 2 CAPSULES AT NIGHT 360 capsule 3   glucose blood (ONETOUCH VERIO) test strip USE AS DIRECTED 50 each 1   Lancets (ONETOUCH ULTRASOFT) lancets Use as instructed 100 each 12   methocarbamol (ROBAXIN) 500 MG tablet Take  1 tablet (500 mg total) by mouth every 6 (six) hours as needed. 15 tablet 0   nystatin cream (MYCOSTATIN) Apply 1 Application topically 2 (two) times daily. 60 g 3   oxybutynin (DITROPAN XL) 15 MG 24 hr tablet Take 1 tablet (15 mg total) by mouth daily. 30 tablet 11   potassium chloride SA (KLOR-CON M) 20 MEQ tablet TAKE 1 TABLET BY MOUTH DAILY 90 tablet 3   traMADol (ULTRAM) 50 MG tablet Take 25-50 mg by  mouth 2 (two) times daily as needed.     triamcinolone ointment (KENALOG) 0.1 % APPLY TOPICALLY TWICE DAILY AS NEEDED. GENERIC EQUIVALENT FOR KENALOG 90 g 0   vitamin B-12 (CYANOCOBALAMIN) 500 MCG tablet Take 500 mcg by mouth daily.     Vitamin D, Cholecalciferol, 25 MCG (1000 UT) TABS Take 1 capsule by mouth daily. 90 tablet 1   No current facility-administered medications on file prior to visit.   She is allergic to codeine..  Review of Systems Pertinent items noted in HPI and remainder of comprehensive ROS otherwise negative.   Objective:   Blood pressure (!) 116/50, pulse 79, resp. rate 16, height _0  (1.549 m), weight 267 lb (121.1 kg). Body mass index is 50.45 kg/m. General appearance: alert and no distress, morbidly obese Abdomen: soft, non-tender; bowel sounds normal; no masses,  no organomegaly Pelvic: external genitalia with lichenification of the inner labia majora and minora bilaterally.  Rectovaginal septum normal.  Vagina without discharge, mildly atrophic, lengthy canal.  Cervix with pink-red fleshy papillary projection from the external os with stalk.   Uterus and adnexae difficult to palpate due to body habitus.   Extremities: extremities normal, atraumatic, no cyanosis or edema Neurologic: Grossly normal    Imaging:  US PELVIC COMPLETE WITH TRANSVAGINAL CLINICAL DATA:  Postmenopausal bleeding  EXAM: TRANSABDOMINAL AND TRANSVAGINAL ULTRASOUND OF PELVIS  TECHNIQUE: Both transabdominal and transvaginal ultrasound examinations of the pelvis were performed. Transabdominal technique was performed for global imaging of the pelvis including uterus, ovaries, adnexal regions, and pelvic cul-de-sac. It was necessary to proceed with endovaginal exam following the transabdominal exam to visualize the uterus, endometrium, and ovaries.  COMPARISON:  None Available.  FINDINGS: Uterus  Measurements: 6.6 x 4.0 x 6.0 cm = volume: 83 mL. Anteverted. Anterior wall  intramural leiomyoma 4.2 x 3.6 x 4.7 cm. This is partially calcified with significant shadowing, obscuring portion of the uterine fundus, mass likely extending submucosal.  Endometrium  Thickness: 11 mm.  No endometrial fluid or mass  Right ovary  Not visualized, likely obscured by bowel  Left ovary  Not visualized, likely obscured by bowel  Other findings  No free pelvic fluid or adnexal masses.  IMPRESSION: Nonvisualization of ovaries.  4.7 cm diameter partially calcified likely submucosal leiomyoma at anterior upper uterus.  Thickened endometrial complex 11 mm thick; in the setting of post-menopausal bleeding, endometrial sampling is indicated to exclude carcinoma. If results are benign, sonohysterogram should be considered for focal lesion work-up. (: Radiological Reasoning: Algorithmic Workup of Abnormal with Endovaginal Sonography and Sonohysterography. AJR.  These results will be called to the ordering clinician or representative by the Radiologist Assistant, and communication documented in the PACS or [employer].  Electronically Signed   By: Lavonia Dana M.D.   On: 07/23/2022 17:54    Assessment:   1. PMB (postmenopausal bleeding)   2. Thickened endometrium   3. Uterine leiomyoma, unspecified location   4. Endocervical polyp   5. Lichen sclerosus of female genitalia  Plan:   -  Discussed etiologies of postmenopausal bleeding, concern about precancerous/hyperplasia or cancerous etiology (5 to 10% percent of cases). Also discussed role of unopposed estrogen exposure in leading to thickened or proliferative endometrium; and its possible correlation with endometrial hyperplasia/carcinoma.  Discussed that obesity is linked to endometrial pathology given that adipose cells produce extra estrogen (estrone) which can cause the endometrium to have a significant amount of estrogen exposure.  However, she was reassured that endometrial atrophy and  endometrial polyps are the most common causes of postmenopausal bleeding.  Uterine bleeding in postmenopausal women is usually light and self-limited. Exclusion of cancer is the main objective; therefore, treatment is usually unnecessary once cancer has been excluded.  The primary goal in the diagnostic evaluation of postmenopausal women with uterine bleeding is to exclude malignancy. Recommend endometrial biopsy based on thickened endometrium. Performed today.  - Uterine fibroids, likely not the cause of her bleeding. Patient notes she has fibroids for many years.  - Lichen sclerosis currently managed with Kenalog ointment prescribed by her PCP.   Endometrial Biopsy Procedure Note  The patient is positioned on the exam table in the dorsal lithotomy position. Bimanual exam confirms uterine position and size. A long Pederson speculum is placed into the vagina (gloved due to redundant vaginal tissue). A single toothed tenaculum is placed onto the anterior lip of the cervix. A suspected endocervical polyp was identified at the external cervical os.  A ring forceps was used to grasp the polyp at the level of the cervical eyes and twisted until released.  It was noted that there was a stalk base protruding within the cervical canal however unable to grasped using [place] forceps.  Next the pipette was placed into the endocervical canal and is advanced to the uterine fundus. Using a piston like technique, with vacuum created by withdrawing the stylus, the endometrial specimen is obtained and transferred to the biopsy container. Minimal bleeding is encountered. The procedure is well tolerated.   Uterine Position:mid    Uterine Length: 6.5 cm   Uterine Specimen: Average   Post procedure instructions are given. The patient is scheduled for follow up appointment in 1 to 2 weeks.    A total of 45 minutes were spent during this encounter, including review of previous progress notes, recent imaging and labs,  face-to-face with time with patient involving procedure, counseling and coordination of care, as well as documentation for current visit.  Rubie Maid, [facility]

## 2022-08-12 NOTE — Addendum Note (Signed)
Addended by: Chilton Greathouse on: 08/12/2022 05:03 PM   Modules accepted: Orders

## 2022-08-13 ENCOUNTER — Encounter: Payer: Self-pay | Admitting: Obstetrics and Gynecology

## 2022-08-14 LAB — SURGICAL PATHOLOGY

## 2022-08-16 DIAGNOSIS — J449 Chronic obstructive pulmonary disease, unspecified: Secondary | ICD-10-CM | POA: Diagnosis not present

## 2022-08-19 ENCOUNTER — Encounter: Payer: Self-pay | Admitting: Family Medicine

## 2022-08-19 ENCOUNTER — Ambulatory Visit (INDEPENDENT_AMBULATORY_CARE_PROVIDER_SITE_OTHER): Payer: Medicare Other | Admitting: Family Medicine

## 2022-08-19 ENCOUNTER — Telehealth: Payer: Medicare Other

## 2022-08-19 VITALS — BP 122/70 | HR 93 | Temp 97.7°F | Resp 18 | Ht 61.0 in | Wt 268.5 lb

## 2022-08-19 DIAGNOSIS — E1169 Type 2 diabetes mellitus with other specified complication: Secondary | ICD-10-CM | POA: Diagnosis not present

## 2022-08-19 DIAGNOSIS — E785 Hyperlipidemia, unspecified: Secondary | ICD-10-CM

## 2022-08-19 DIAGNOSIS — Z862 Personal history of diseases of the blood and blood-forming organs and certain disorders involving the immune mechanism: Secondary | ICD-10-CM

## 2022-08-19 DIAGNOSIS — E1122 Type 2 diabetes mellitus with diabetic chronic kidney disease: Secondary | ICD-10-CM | POA: Diagnosis not present

## 2022-08-19 DIAGNOSIS — N1831 Chronic kidney disease, stage 3a: Secondary | ICD-10-CM | POA: Diagnosis not present

## 2022-08-19 DIAGNOSIS — J4520 Mild intermittent asthma, uncomplicated: Secondary | ICD-10-CM

## 2022-08-19 DIAGNOSIS — D692 Other nonthrombocytopenic purpura: Secondary | ICD-10-CM | POA: Diagnosis not present

## 2022-08-19 DIAGNOSIS — I10 Essential (primary) hypertension: Secondary | ICD-10-CM

## 2022-08-19 DIAGNOSIS — R2681 Unsteadiness on feet: Secondary | ICD-10-CM | POA: Diagnosis not present

## 2022-08-19 DIAGNOSIS — E559 Vitamin D deficiency, unspecified: Secondary | ICD-10-CM

## 2022-08-19 DIAGNOSIS — N950 Postmenopausal bleeding: Secondary | ICD-10-CM

## 2022-08-19 DIAGNOSIS — E538 Deficiency of other specified B group vitamins: Secondary | ICD-10-CM

## 2022-08-19 DIAGNOSIS — N183 Chronic kidney disease, stage 3 unspecified: Secondary | ICD-10-CM | POA: Diagnosis not present

## 2022-08-19 DIAGNOSIS — G4733 Obstructive sleep apnea (adult) (pediatric): Secondary | ICD-10-CM

## 2022-08-19 DIAGNOSIS — Z6841 Body Mass Index (BMI) 40.0 and over, adult: Secondary | ICD-10-CM

## 2022-08-19 DIAGNOSIS — M109 Gout, unspecified: Secondary | ICD-10-CM

## 2022-08-19 LAB — POCT GLYCOSYLATED HEMOGLOBIN (HGB A1C): Hemoglobin A1C: 7 % — AB (ref 4.0–5.6)

## 2022-08-19 MED ORDER — DAPAGLIFLOZIN PROPANEDIOL 10 MG PO TABS: 10.0000 mg | ORAL_TABLET | Freq: Every day | ORAL | 3 refills | Status: DC

## 2022-08-19 MED ORDER — TRULICITY 1.5 MG/0.5ML ~~LOC~~ SOAJ: 1.5000 mg | SUBCUTANEOUS | 3 refills | Status: DC

## 2022-08-19 NOTE — Progress Notes (Signed)
Name: Amber Avery   MRN: [id number]    DOB: 05-16-1946   Date:08/19/2022       Progress Note  Subjective  Chief Complaint  Follow up   HPI  HTN:   She is now on benazepril 5 mg. No chest pain, palpitation, dizziness  or change in exercise tolerance BP is at goal   Dyslipidemia: taking statin therapy, no side effects, no chest pain or myalgia. Last LDL was up from 60 to 83, she is now taking it daily and we will recheck level, tolerating it well .   Asthma Mild intermittent: she states she has been doing well, no wheezing, cough , mild SOB with activity but likely multifactorial. Stable.   DMII: She is on Trulicity, no polyphagia, polyuria (stable because of diuretic) denies  polydipsia . She has dyslipidemia but last LDL was not at goal, she told me not as compliant with statin therapy at the time and we will recheck it today,  CKI stage III she is on low dose ACE but last urine micro elevated and we will add farxiga today, also has neuropathy that is stable on gabapentin.   Morbid obesity: BMI over 40 , weight is up again, she has OA, back pain,weight is trending up, she states she has been stress eating lately     OSA: she wear machine every night, she uses oxygen with CPAP and is compliant, she tried a different mask but she did not like it and is back on the previous type and is doing well     OA : she saw Ortho , Amber Avery, and he advised her not to have surgery because of her weight, she had one round of hyaluronic injection without help and it was too expensive . She is still taking Tylenol She still has intermittent right knee effusion when more active, no redness or increase in warmth. She has a hoveround, uses walker and sometimes cane. Stable    Iron deficiency anemia: last level was better, under the care of Dr. Tasia Avery and had 5 iron infusions in the past but CBC has been back to normal, last checked 03/23  last visit with GI was with Amber Avery - GI in 2020 . We will recheck labs  due to post-menopausal bleeding   Post-menopausal bleeding : she had polyp removal and also endometrial biopsy done end of October 23 and still has some spotting and some pelvic discomfort, she is going to see ob soon       Senile purpura: unchanged, reassurance given    DDD lumbar spine and radiculitis: seen by Psyatrist , seeing Amber Avery, she has MRI lumbar spine, steroid injection for trochanteric bursitis did not work, she also had PT . She is still taking Gabapentin and takes Tramadol every night but only prn during the day when the pain is intense She states pain is worse in the mornings and is 7/10, but good during the day and gest worse in the evening again but coping well   B12 deficiency: taking supplements , last level at goal   Controlled gout: taking allopurinol , uric acid has been at goal, no recent episodes    Patient Active Problem List   Diagnosis Date Noted   Greater trochanteric bursitis, left 06/18/2022   Gait instability 12/13/2020   Stage 3a chronic kidney disease ([place]) 12/13/2020   Cellulitis of right ankle 04/12/2020   Morbid obesity with BMI of 45.0-49.9, adult ([place]) 02/06/2020   History of gastritis  08/14/2019   Angiodysplasia of stomach and duodenum    Iron deficiency anemia 04/20/2018   Anemia, unspecified 44/31/5400   Lichen sclerosus of female genitalia 06/29/2016   Primary osteoarthritis of both knees 04/07/2016   Special screening for malignant neoplasms, colon    Right thyroid nodule 06/26/2015   Asthma, intermittent 04/03/2015   Benign essential HTN 04/03/2015   Carpal tunnel syndrome 04/03/2015   Chronic kidney disease (CKD), stage II (mild) 04/03/2015   Controlled gout 04/03/2015   Arteriosclerosis of coronary artery 04/03/2015   Diabetes mellitus with renal manifestations, controlled ([place]) 04/03/2015   Dyslipidemia 04/03/2015   Edema extremities 04/03/2015   Elevated sedimentation rate 04/03/2015   Knee pain 04/03/2015   Lumbar  radiculitis 04/03/2015   Obstructive apnea 04/03/2015   Lumbosacral spondylosis 04/03/2015   Osteoarthritis, chronic 04/03/2015   Psoriasis 04/03/2015   Vitamin D deficiency 04/03/2015   Varicose veins 04/03/2015   Goiter 12/23/2014   Degeneration of intervertebral disc of lumbar region 08/23/2014   Corns and callosity 06/11/2009    Past Surgical History:  Procedure Laterality Date   APPENDECTOMY     CATARACT EXTRACTION W/PHACO Left 08/14/2020   Procedure: CATARACT EXTRACTION PHACO AND INTRAOCULAR LENS PLACEMENT ([facility]) LEFT DIABETIC 3.56 00:59.6 6.0%;  Surgeon: Amber Koyanagi, MD;  Location: [facility];  Service: Ophthalmology;  Laterality: Left;  Diabetic   CATARACT EXTRACTION W/PHACO Right 09/04/2020   Procedure: CATARACT EXTRACTION PHACO AND INTRAOCULAR LENS PLACEMENT ([place]) RIGHT DIABETIC;  Surgeon: Amber Koyanagi, MD;  Location: [facility];  Service: Ophthalmology;  Laterality: Right;  4.72 1:03.2 7.4%   CHOLECYSTECTOMY  1977   COLONOSCOPY WITH PROPOFOL N/A 01/21/2016   Procedure: COLONOSCOPY WITH PROPOFOL;  Surgeon: Amber Lame, MD;  Location: [facility];  Service: Endoscopy;  Laterality: N/A;   DILATION AND CURETTAGE OF UTERUS     Due to Amenorrhea   ESOPHAGOGASTRODUODENOSCOPY (EGD) WITH PROPOFOL N/A 08/16/2018   Procedure: ESOPHAGOGASTRODUODENOSCOPY (EGD) WITH PROPOFOL;  Surgeon: Amber Lame, MD;  Location: [facility];  Service: Endoscopy;  Laterality: N/A;   [place]     HERNIA REPAIR  2011   Temporal Area Excision Biopsy     For Birth Mark Changes, Negative Pathology   TONSILLECTOMY AND ADENOIDECTOMY      Family History  Problem Relation Age of Onset   Cancer Mother        brain tumor   Kidney disease Mother    Hypertension Mother    Heart disease Father    COPD Father    Hypertension Sister    Arthritis Sister    Cancer Sister    Heart murmur Sister    GER disease Sister    Lupus Sister     Diabetes Sister    Arthritis Sister    Osteopenia Sister    Heart disease Sister    Hypertension Sister    Breast cancer Maternal Aunt 66   Leukemia Maternal Aunt     Social History   Tobacco Use   Smoking status: Former    Packs/day: 2.00    Years: 15.00    Total pack years: 30.00    Types: Cigarettes    Quit date: 1983    Years since quitting: 40.8   Smokeless tobacco: Never   Tobacco comments:    smoking cessation materials not required  Substance Use Topics   Alcohol use: Not Currently    Alcohol/week: 0.0 standard drinks of alcohol     Current Outpatient Medications:  acetaminophen (TYLENOL) 500 MG tablet, Take 1 tablet (500 mg total) by mouth every 6 (six) hours as needed. (Patient taking differently: Take 1,000 mg by mouth every 8 (eight) hours.), Disp: 480 tablet, Rfl: 0   albuterol (VENTOLIN HFA) 108 (90 Base) MCG/ACT inhaler, Inhale 2 puffs into the lungs every 6 (six) hours as needed for wheezing or shortness of breath., Disp: 8 g, Rfl: 0   allopurinol (ZYLOPRIM) 300 MG tablet, TAKE 1 TABLET BY MOUTH DAILY, Disp: 90 tablet, Rfl: 3   atorvastatin (LIPITOR) 20 MG tablet, Take 1 tablet (20 mg total) by mouth daily. Daily for cholesterol, Disp: 90 tablet, Rfl: 0   benazepril (LOTENSIN) 5 MG tablet, TAKE 1 TABLET BY MOUTH DAILY, Disp: 90 tablet, Rfl: 3   Blood Pressure KIT, 1 each by Does not apply route daily., Disp: 1 kit, Rfl: 0   dapagliflozin propanediol (FARXIGA) 10 MG TABS tablet, Take 1 tablet (10 mg total) by mouth daily before breakfast., Disp: 90 tablet, Rfl: 3   diclofenac Sodium (VOLTAREN) 1 % GEL, APPLY 4 GRAMS TOPICALLY FOUR TIMES DAILY AS DIRECTED GENERIC EQUIVALENT FOR VOL.TAREN, Disp: 300 g, Rfl: 1   furosemide (LASIX) 40 MG tablet, TAKE 1 TABLET BY MOUTH DAILY, Disp: 90 tablet, Rfl: 3   gabapentin (NEURONTIN) 300 MG capsule, TAKE 1 CAPSULE BY MOUTH IN THE MORNING, 1 CAPSULE IN THE EVENING AND 2 CAPSULES AT NIGHT, Disp: 360 capsule, Rfl: 3   glucose  blood (ONETOUCH VERIO) test strip, USE AS DIRECTED, Disp: 50 each, Rfl: 1   Lancets (ONETOUCH ULTRASOFT) lancets, Use as instructed, Disp: 100 each, Rfl: 12   methocarbamol (ROBAXIN) 500 MG tablet, Take 1 tablet (500 mg total) by mouth every 6 (six) hours as needed., Disp: 15 tablet, Rfl: 0   nystatin cream (MYCOSTATIN), Apply 1 Application topically 2 (two) times daily., Disp: 60 g, Rfl: 3   oxybutynin (DITROPAN XL) 15 MG 24 hr tablet, Take 1 tablet (15 mg total) by mouth daily., Disp: 30 tablet, Rfl: 11   potassium chloride SA (KLOR-CON M) 20 MEQ tablet, TAKE 1 TABLET BY MOUTH DAILY, Disp: 90 tablet, Rfl: 3   traMADol (ULTRAM) 50 MG tablet, Take 25-50 mg by mouth 2 (two) times daily as needed., Disp: , Rfl:    triamcinolone ointment (KENALOG) 0.1 %, APPLY TOPICALLY TWICE DAILY AS NEEDED. GENERIC EQUIVALENT FOR KENALOG, Disp: 90 g, Rfl: 0   vitamin B-12 (CYANOCOBALAMIN) 500 MCG tablet, Take 500 mcg by mouth daily., Disp: , Rfl:    Vitamin D, Cholecalciferol, 25 MCG (1000 UT) TABS, Take 1 capsule by mouth daily., Disp: 90 tablet, Rfl: 1   Dulaglutide (TRULICITY) 1.5 QQ/2.2LN SOPN, Inject 1.5 mg into the skin once a week., Disp: 6 mL, Rfl: 3  Allergies  Allergen Reactions   Codeine Hives and Nausea And Vomiting    I personally reviewed active problem list, medication list, allergies, family history, social history, health maintenance with the patient/caregiver today.   ROS  Ten systems reviewed and is negative except as mentioned in HPI   Objective  Vitals:   08/19/22 1317  BP: 122/70  Pulse: 93  Resp: 18  Temp: 97.7 F (36.5 C)  TempSrc: Oral  SpO2: 96%  Weight: 268 lb 8 oz (121.8 kg)  Height: _0  (1.549 m)    Body mass index is 50.73 kg/m.  Physical Exam  Constitutional: Patient appears well-developed and well-nourished. Obese  No distress.  HEENT: head atraumatic, normocephalic, pupils equal and reactive to light, neck supple  Cardiovascular: Normal rate, regular  rhythm and normal heart sounds.  No murmur heard. Trace BLE edema. Pulmonary/Chest: Effort normal and breath sounds normal. No respiratory distress. Abdominal: Soft.  There is no tenderness. Psychiatric: Patient has a normal mood and affect. behavior is normal. Judgment and thought content normal.   Recent Results (from the past 2160 hour(s))  Urinalysis, Complete     Status: Abnormal   Collection Time: 07/22/22  2:18 PM  Result Value Ref Range   Specific Gravity, UA <1.005 (L) 1.005 - 1.030   pH, UA 5.5 5.0 - 7.5   Color, UA Yellow Yellow   Appearance Ur Clear Clear   Leukocytes,UA Negative Negative   Protein,UA Negative Negative/Trace   Glucose, UA Negative Negative   Ketones, UA Negative Negative   RBC, UA Negative Negative   Bilirubin, UA Negative Negative   Urobilinogen, Ur 0.2 0.2 - 1.0 mg/dL   Nitrite, UA Negative Negative   Microscopic Examination See below:   Microscopic Examination     Status: Abnormal   Collection Time: 07/22/22  2:18 PM   Urine  Result Value Ref Range   WBC, UA 0-5 0 - 5 /hpf   RBC, Urine 0-2 0 - 2 /hpf   Epithelial Cells (non renal) 0-10 0 - 10 /hpf   Bacteria, UA Moderate (A) None seen/Few  Bladder Scan (Post Void Residual) in office     Status: None   Collection Time: 07/22/22  2:28 PM  Result Value Ref Range   Scan Result 31 ml   Surgical pathology     Status: None   Collection Time: 08/12/22  4:58 PM  Result Value Ref Range   SURGICAL PATHOLOGY      SURGICAL PATHOLOGY CASE: [id number] PATIENT: Perle Bady Surgical Pathology Report     Clinical History: PMB, endocervical polyp (cm)    FINAL MICROSCOPIC DIAGNOSIS:  A. ENDOMETRIUM, BIOPSY: -  Blood and superficial strips of endometrium with tubal metaplasia, negative for atypia/hyperplasia on sections examined (no stromal is present to adequately evaluate for architecture). -  Separate fragment of benign endocervical polyp  B. ENDOCERVIX, POLYPECTOMY: -  Benign  endocervical polyp.  GROSS DESCRIPTION:  A.  Received in formalin are tan, hemorrhagic soft tissue fragments that are entirely submitted. Volume: 1.5 x 1.4 x 0.2 cm. (1 B)   B.  Received in formalin is a tan nodule of soft tissue measuring 1.4 x 0.7 x 0.3 cm.  The specimen is entirely submitted in 1 block. (KW, 08/13/2022)    Final Diagnosis performed by Tilford Pillar DO.   Electronically signed 08/14/2022 Technical component performed at [employer]. [facility], [place] [address], [place], [place] [address].  Professional component performed at [facility], [place] [address], [place], [place] [address].  Immunohistochemistry Technical component (if applicable) was performed at [facility]. [address], [place], [place], [place] [address].   IMMUNOHISTOCHEMISTRY DISCLAIMER (if applicable): Some of these immunohistochemical stains may have been developed and the performance characteristics determine by [facility]. Some may not have been cleared or approved by the U.S. Food and Drug Administration. The FDA has determined that such clearance or approval is not necessary. This test is used for clinical purposes. It should not be regarded as investigational or for research. This laboratory is certified under the [place] (CLIA-88) as qualified to perform high complexity clinical laboratory testing.  The controls stained appropriately.   POCT HgB A1C  Status: Abnormal   Collection Time: 08/19/22  1:24 PM  Result Value Ref Range   Hemoglobin A1C 7.0 (A) 4.0 - 5.6 %   HbA1c POC (<> result, manual entry)     HbA1c, POC (prediabetic range)     HbA1c, POC (controlled diabetic range)      Diabetic Foot Exam: Diabetic Foot Exam - Simple   Simple Foot Form Visual Inspection No deformities, no ulcerations, no other skin breakdown bilaterally: Yes Sensation Testing Intact to touch  and monofilament testing bilaterally: Yes Pulse Check Posterior Tibialis and Dorsalis pulse intact bilaterally: Yes Comments      PHQ2/9:    08/19/2022    1:20 PM 07/07/2022    1:04 PM 06/25/2022    9:53 AM 06/18/2022    7:44 AM 04/22/2022    9:58 AM  Depression screen PHQ 2/9  Decreased Interest 0 0 0 0 0  Down, Depressed, Hopeless 0 0 0 0 0  PHQ - 2 Score 0 0 0 0 0  Altered sleeping _0 0  Tired, decreased energy 1 1 0 0 0  Change in appetite 0 0 1 1 0  Feeling bad or failure about yourself  0 0 0 0 0  Trouble concentrating 0 0 0 0 0  Moving slowly or fidgety/restless 0 0 0 0 0  Suicidal thoughts 0 0 0 0 0  PHQ-9 Score _1 0    phq 9 is negative   Fall Risk:    08/19/2022    1:19 PM 08/12/2022    2:42 PM 07/07/2022    1:04 PM 06/25/2022    9:53 AM 06/18/2022    7:43 AM  Fall Risk   Falls in the past year? 0 0 0 0 0  Number falls in past yr:  0 0  0  Injury with Fall?  0 0  0  Risk for fall due to : No Fall Risks Impaired mobility No Fall Risks  No Fall Risks  Follow up Falls prevention discussed;Education provided;Falls evaluation completed Falls evaluation completed Falls prevention discussed Education provided;Falls prevention discussed;Falls evaluation completed Falls prevention discussed    Assessment & Plan  1. Controlled type 2 diabetes mellitus with stage 3 chronic kidney disease, without long-term current use of insulin (HCC)  - POCT HgB A1C - HM Diabetes Foot Exam - Microalbumin / creatinine urine ratio - Dulaglutide (TRULICITY) 1.5 GB/2.0FE SOPN; Inject 1.5 mg into the skin once a week.  Dispense: 6 mL; Refill: 3 - dapagliflozin propanediol (FARXIGA) 10 MG TABS tablet; Take 1 tablet (10 mg total) by mouth daily before breakfast.  Dispense: 90 tablet; Refill: 3  2. Stage 3a chronic kidney disease (HCC)  - CBC with Differential/Platelet - COMPLETE METABOLIC PANEL WITH GFR - Dulaglutide (TRULICITY) 1.5 OF/1.2RF SOPN; Inject 1.5 mg into the skin once  a week.  Dispense: 6 mL; Refill: 3 - dapagliflozin propanediol (FARXIGA) 10 MG TABS tablet; Take 1 tablet (10 mg total) by mouth daily before breakfast.  Dispense: 90 tablet; Refill: 3  3. Hyperlipidemia associated with type 2 diabetes mellitus ([place])  - Lipid panel  4. Senile purpura ([place])  Reassurance given  5. Benign essential HTN  - CBC with Differential/Platelet - COMPLETE METABOLIC PANEL WITH GFR  6. OSA on CPAP   7. Morbid obesity with BMI of 45.0-49.9, adult [facility])  Discussed with the patient the risk posed by an increased BMI. Discussed importance of portion control, calorie counting and at least 150 minutes  of physical activity weekly. Avoid sweet beverages and drink more water. Eat at least 6 servings of fruit and vegetables daily    8. B12 deficiency  Continue supplements  9. Asthma, mild intermittent, well-controlled   10. Gait instability  Using a walker and stable  11. Controlled gout  Doing well on Allopurinol  12. Vitamin D deficiency  Continue supplements  13. History of iron deficiency anemia  We will recheck labs   - CBC with Differential/Platelet - Iron, TIBC and Ferritin Panel  14. Post-menopausal bleeding  Keep follow up with obgyn  - CBC with Differential/Platelet - Iron, TIBC and Ferritin Panel

## 2022-08-20 LAB — CBC WITH DIFFERENTIAL/PLATELET
Absolute Monocytes: 909 {cells}/uL (ref 200–950)
Basophils Absolute: 54 {cells}/uL (ref 0–200)
Basophils Relative: 0.7 %
Eosinophils Absolute: 293 {cells}/uL (ref 15–500)
Eosinophils Relative: 3.8 %
HCT: 37.1 % (ref 35.0–45.0)
Hemoglobin: 12.8 g/dL (ref 11.7–15.5)
Lymphs Abs: 1740 {cells}/uL (ref 850–3900)
MCH: 33 pg (ref 27.0–33.0)
MCHC: 34.5 g/dL (ref 32.0–36.0)
MCV: 95.6 fL (ref 80.0–100.0)
MPV: 11.5 fL (ref 7.5–12.5)
Monocytes Relative: 11.8 %
Neutro Abs: 4705 {cells}/uL (ref 1500–7800)
Neutrophils Relative %: 61.1 %
Platelets: 215 10*3/uL (ref 140–400)
RBC: 3.88 Million/uL (ref 3.80–5.10)
RDW: 13.5 % (ref 11.0–15.0)
Total Lymphocyte: 22.6 %
WBC: 7.7 10*3/uL (ref 3.8–10.8)

## 2022-08-20 LAB — COMPLETE METABOLIC PANEL WITHOUT GFR
AG Ratio: 1.6 (calc) (ref 1.0–2.5)
ALT: 17 U/L (ref 6–29)
AST: 20 U/L (ref 10–35)
Albumin: 4.1 g/dL (ref 3.6–5.1)
Alkaline phosphatase (APISO): 85 U/L (ref 37–153)
BUN/Creatinine Ratio: 23 (calc) — ABNORMAL HIGH (ref 6–22)
BUN: 30 mg/dL — ABNORMAL HIGH (ref 7–25)
CO2: 32 mmol/L (ref 20–32)
Calcium: 9.9 mg/dL (ref 8.6–10.4)
Chloride: 104 mmol/L (ref 98–110)
Creat: 1.32 mg/dL — ABNORMAL HIGH (ref 0.60–1.00)
Globulin: 2.5 g/dL (ref 1.9–3.7)
Glucose, Bld: 104 mg/dL — ABNORMAL HIGH (ref 65–99)
Potassium: 4.5 mmol/L (ref 3.5–5.3)
Sodium: 145 mmol/L (ref 135–146)
Total Bilirubin: 0.6 mg/dL (ref 0.2–1.2)
Total Protein: 6.6 g/dL (ref 6.1–8.1)
eGFR: 42 mL/min/{1.73_m2} — ABNORMAL LOW

## 2022-08-20 LAB — LIPID PANEL
Cholesterol: 137 mg/dL
HDL: 55 mg/dL
LDL Cholesterol (Calc): 59 mg/dL
Non-HDL Cholesterol (Calc): 82 mg/dL
Total CHOL/HDL Ratio: 2.5 (calc)
Triglycerides: 147 mg/dL

## 2022-08-20 LAB — MICROALBUMIN / CREATININE URINE RATIO
Creatinine, Urine: 20 mg/dL (ref 20–275)
Microalb Creat Ratio: 30 ug/mg{creat} — ABNORMAL HIGH
Microalb, Ur: 0.6 mg/dL

## 2022-08-20 LAB — IRON,TIBC AND FERRITIN PANEL
%SAT: 21 % (ref 16–45)
Ferritin: 52 ng/mL (ref 16–288)
Iron: 84 ug/dL (ref 45–160)
TIBC: 400 ug/dL (ref 250–450)

## 2022-08-24 ENCOUNTER — Telehealth: Payer: Self-pay

## 2022-08-24 NOTE — Progress Notes (Signed)
    Chronic Care Management Pharmacy Assistant   Name: Amber Avery  MRN: [id number] DOB: 31-Mar-1946  Reason for Encounter: Patient Assistance Application for Farxiga   I received a task from Junius Argyle, CPP requesting that I start a patient assistance application for patient's Farxiga 10 mg tablet once daily.  I started the application via the [employer] online website. Unfortunately, the patient was not eligible for  enrollment per the website due to Income criteria not being meet.  CPP notified. Medications: Outpatient Encounter Medications as of 08/24/2022  Medication Sig Note   acetaminophen (TYLENOL) 500 MG tablet Take 1 tablet (500 mg total) by mouth every 6 (six) hours as needed. (Patient taking differently: Take 1,000 mg by mouth every 8 (eight) hours.)    albuterol (VENTOLIN HFA) 108 (90 Base) MCG/ACT inhaler Inhale 2 puffs into the lungs every 6 (six) hours as needed for wheezing or shortness of breath.    allopurinol (ZYLOPRIM) 300 MG tablet TAKE 1 TABLET BY MOUTH DAILY    atorvastatin (LIPITOR) 20 MG tablet Take 1 tablet (20 mg total) by mouth daily. Daily for cholesterol    benazepril (LOTENSIN) 5 MG tablet TAKE 1 TABLET BY MOUTH DAILY    Blood Pressure KIT 1 each by Does not apply route daily.    dapagliflozin propanediol (FARXIGA) 10 MG TABS tablet Take 1 tablet (10 mg total) by mouth daily before breakfast.    diclofenac Sodium (VOLTAREN) 1 % GEL APPLY 4 GRAMS TOPICALLY FOUR TIMES DAILY AS DIRECTED GENERIC EQUIVALENT FOR VOL.TAREN    Dulaglutide (TRULICITY) 1.5 IE/3.3IR SOPN Inject 1.5 mg into the skin once a week.    furosemide (LASIX) 40 MG tablet TAKE 1 TABLET BY MOUTH DAILY    gabapentin (NEURONTIN) 300 MG capsule TAKE 1 CAPSULE BY MOUTH IN THE MORNING, 1 CAPSULE IN THE EVENING AND 2 CAPSULES AT NIGHT    glucose blood (ONETOUCH VERIO) test strip USE AS DIRECTED    Lancets (ONETOUCH ULTRASOFT) lancets Use as instructed    methocarbamol (ROBAXIN) 500 MG tablet Take 1  tablet (500 mg total) by mouth every 6 (six) hours as needed.    nystatin cream (MYCOSTATIN) Apply 1 Application topically 2 (two) times daily.    oxybutynin (DITROPAN XL) 15 MG 24 hr tablet Take 1 tablet (15 mg total) by mouth daily.    potassium chloride SA (KLOR-CON M) 20 MEQ tablet TAKE 1 TABLET BY MOUTH DAILY    traMADol (ULTRAM) 50 MG tablet Take 25-50 mg by mouth 2 (two) times daily as needed. 03/04/2022: Prescribed by Allene Dillon, NP   triamcinolone ointment (KENALOG) 0.1 % APPLY TOPICALLY TWICE DAILY AS NEEDED. GENERIC EQUIVALENT FOR KENALOG    vitamin B-12 (CYANOCOBALAMIN) 500 MCG tablet Take 500 mcg by mouth daily.    Vitamin D, Cholecalciferol, 25 MCG (1000 UT) TABS Take 1 capsule by mouth daily.    No facility-administered encounter medications on file as of 08/24/2022.    Lynann Bologna, CPA/CMA Clinical Pharmacist Assistant Phone: [phone]

## 2022-08-26 ENCOUNTER — Telehealth: Payer: Medicare Other

## 2022-08-26 ENCOUNTER — Telehealth: Payer: Self-pay | Admitting: Obstetrics and Gynecology

## 2022-08-26 NOTE — Progress Notes (Incomplete)
Chronic Care Management Pharmacy Note  08/26/2022 Name:  Amber Avery MRN:  [id number] DOB:  1946-06-03  Summary: Patient presents for CCM follow-up.   Recommendations/Changes made from today's visit: Continue Current Medications  Plan: CPP Follow-up in 6 months   Subjective: Amber Avery is an 76 y.o. year old female who is a primary patient of Steele Sizer, MD.  The CCM team was consulted for assistance with disease management and care coordination needs.    Engaged with patient by telephone for follow up visit in response to provider referral for pharmacy case management and/or care coordination services.   Consent to Services:  The patient was given information about Chronic Care Management services, agreed to services, and gave verbal consent prior to initiation of services.  Please see initial visit note for detailed documentation.   Patient Care Team: Steele Sizer, MD as PCP - General (Family Medicine) Bryson Ha, OD as Consulting Physician (Optometry) Sharlet Salina, MD as Consulting Physician (Physical Medicine and Rehabilitation) Germaine Pomfret, [facility] as Pharmacist (Pharmacist)  Recent office visits: 08/19/22: Patient presented to Dr. Ancil Boozer for follow-up. Farxiga 10 mg daily.  07/07/22: Patient presented to Dr. Ancil Boozer for follow-up.   Recent consult visits: 01/28/22: Patient presented to Allene Dillon, NP (Physical Medicine)  Hospital visits: 12/21/21: Patient presented to ED for back pain.   Objective:  Lab Results  Component Value Date   CREATININE 1.32 (H) 08/19/2022   BUN 30 (H) 08/19/2022   GFRNONAA 46 (L) 12/13/2020   GFRAA 53 (L) 12/13/2020   NA 145 08/19/2022   K 4.5 08/19/2022   CALCIUM 9.9 08/19/2022   CO2 32 08/19/2022   GLUCOSE 104 (H) 08/19/2022    Lab Results  Component Value Date/Time   HGBA1C 7.0 (A) 08/19/2022 01:24 PM   HGBA1C 6.4 (A) 04/22/2022 09:59 AM   HGBA1C 6.6 (H) 12/17/2021 11:39 AM   HGBA1C 6.0  04/14/2019 10:33 AM   HGBA1C 6.3 12/08/2018 11:22 AM   MICROALBUR 0.6 08/19/2022 02:00 PM   MICROALBUR 1.4 12/17/2021 11:39 AM   MICROALBUR 20 04/11/2018 10:53 AM   MICROALBUR 20 08/09/2017 09:57 AM    Last diabetic Eye exam:  Lab Results  Component Value Date/Time   HMDIABEYEEXA No Retinopathy 10/01/2021 12:00 AM    Last diabetic Foot exam: No results found for: "HMDIABFOOTEX"   Lab Results  Component Value Date   CHOL 137 08/19/2022   HDL 55 08/19/2022   LDLCALC 59 08/19/2022   TRIG 147 08/19/2022   CHOLHDL 2.5 08/19/2022       Latest Ref Rng & Units 08/19/2022    2:00 PM 12/17/2021   11:39 AM 12/13/2020   11:44 AM  Hepatic Function  Total Protein 6.1 - 8.1 g/dL 6.6  6.6  7.1   AST 10 - 35 U/L 20  24  34   ALT 6 - 29 U/L _0 Total Bilirubin 0.2 - 1.2 mg/dL 0.6  0.7  0.7     Lab Results  Component Value Date/Time   TSH 4.400 12/09/2015 10:10 AM   TSH 4.380 09/18/2015 11:39 AM       Latest Ref Rng & Units 08/19/2022    2:00 PM 12/17/2021   11:39 AM 12/13/2020   11:44 AM  CBC  WBC 3.8 - 10.8 Thousand/uL 7.7  6.9  6.1   Hemoglobin 11.7 - 15.5 g/dL 12.8  12.7  13.0   Hematocrit 35.0 - 45.0 % 37.1  38.0  38.9   Platelets 140 - 400 Thousand/uL 215  220  208     Lab Results  Component Value Date/Time   VD25OH 37 12/17/2021 11:39 AM   VD25OH 39 12/13/2020 11:44 AM    Clinical ASCVD: No  The 10-year ASCVD risk score (Arnett DK, et al., 2019) is: 33%   Values used to calculate the score:     Age: 25 years     Sex: Female     Is Non-Hispanic African American: No     Diabetic: Yes     Tobacco smoker: No     Systolic Blood Pressure: 119 mmHg     Is BP treated: Yes     HDL Cholesterol: 55 mg/dL     Total Cholesterol: 137 mg/dL       08/19/2022    1:20 PM 07/07/2022    1:04 PM 06/25/2022    9:53 AM  Depression screen PHQ 2/9  Decreased Interest 0 0 0  Down, Depressed, Hopeless 0 0 0  PHQ - 2 Score 0 0 0  Altered sleeping _0 Tired, decreased energy  1 1 0  Change in appetite 0 0 1  Feeling bad or failure about yourself  0 0 0  Trouble concentrating 0 0 0  Moving slowly or fidgety/restless 0 0 0  Suicidal thoughts 0 0 0  PHQ-9 Score _1 Social History   Tobacco Use  Smoking Status Former   Packs/day: 2.00   Years: 15.00   Total pack years: 30.00   Types: Cigarettes   Quit date: 43   Years since quitting: 40.8  Smokeless Tobacco Never  Tobacco Comments   smoking cessation materials not required   BP Readings from Last 3 Encounters:  08/19/22 122/70  08/12/22 (!) 116/50  07/22/22 (!) 145/73   Pulse Readings from Last 3 Encounters:  08/19/22 93  08/12/22 79  07/22/22 91   Wt Readings from Last 3 Encounters:  08/19/22 268 lb 8 oz (121.8 kg)  08/12/22 267 lb (121.1 kg)  07/22/22 265 lb 12.8 oz (120.6 kg)   BMI Readings from Last 3 Encounters:  08/19/22 50.73 kg/m  08/12/22 50.45 kg/m  07/22/22 50.22 kg/m    Assessment/Interventions: Review of patient past medical history, allergies, medications, health status, including review of consultants reports, laboratory and other test data, was performed as part of comprehensive evaluation and provision of chronic care management services.   SDOH:  (Social Determinants of Health) assessments and interventions performed: Yes SDOH Interventions    Flowsheet Row Chronic Care Management from 09/03/2021 in [place] from 06/24/2021 in [place] Management from 10/03/2020 in [place] Management from 04/30/2020 in [place] Interventions -- Intervention Not Indicated -- --  Housing Interventions -- Intervention Not Indicated -- --  Transportation Interventions -- Intervention Not Indicated Intervention Not Indicated --  Financial Strain Interventions Other (Comment)  [PAP] Intervention Not Indicated  Intervention Not Indicated Other (Comment)  [PAP as needed]  Physical Activity Interventions -- Intervention Not Indicated -- --  Stress Interventions -- Intervention Not Indicated -- --  Social Connections Interventions -- Intervention Not Indicated -- --      SDOH Screenings   Food Insecurity: No Food Insecurity (07/07/2022)  Housing: Low Risk  (07/07/2022)  Transportation Needs: No Transportation Needs (06/24/2021)  Utilities: Not At Risk (07/07/2022)  Alcohol Screen: Low Risk  (06/24/2021)  Depression (PHQ2-9): Low Risk  (08/19/2022)  Financial Resource Strain: Low Risk  (07/07/2022)  Physical Activity: Insufficiently Active (07/07/2022)  Social Connections: Socially Isolated (07/07/2022)  Stress: No Stress Concern Present (07/07/2022)  Tobacco Use: Medium Risk (08/19/2022)    CCM Care Plan  Allergies  Allergen Reactions   Codeine Hives and Nausea And Vomiting    Medications Reviewed Today     Reviewed by Steele Sizer, MD (Physician) on 08/19/22 at 1333  Med List Status: <None>   Medication Order Taking? Sig Documenting Provider Last Dose Status Informant  acetaminophen (TYLENOL) 500 MG tablet [id number] Yes Take 1 tablet (500 mg total) by mouth every 6 (six) hours as needed.  Patient taking differently: Take 1,000 mg by mouth every 8 (eight) hours.   Steele Sizer, MD Taking Active   albuterol (VENTOLIN HFA) 108 (90 Base) MCG/ACT inhaler [id number] Yes Inhale 2 puffs into the lungs every 6 (six) hours as needed for wheezing or shortness of breath. Steele Sizer, MD Taking Active   allopurinol (ZYLOPRIM) 300 MG tablet [id number] Yes TAKE 1 TABLET BY MOUTH DAILY Sowles, Drue Stager, MD Taking Active   atorvastatin (LIPITOR) 20 MG tablet [id number] Yes Take 1 tablet (20 mg total) by mouth daily. Daily for cholesterol Steele Sizer, MD Taking Active   benazepril (LOTENSIN) 5 MG tablet [id number] Yes TAKE 1 TABLET BY MOUTH DAILY Steele Sizer, MD Taking Active   Blood Pressure KIT  [id number] Yes 1 each by Does not apply route daily. Steele Sizer, MD Taking Active   diclofenac Sodium (VOLTAREN) 1 % GEL [id number] Yes APPLY 4 GRAMS TOPICALLY FOUR TIMES DAILY AS DIRECTED GENERIC EQUIVALENT FOR VOL.Lavonna Rua, Drue Stager, MD Taking Active   Dulaglutide (TRULICITY) 1.5 FX/9.0[place] Bonney Aid [id number] Yes Inject 1.5 mg into the skin once a week. Steele Sizer, MD Taking Active   furosemide (LASIX) 40 MG tablet [id number] Yes TAKE 1 TABLET BY MOUTH DAILY Sowles, Drue Stager, MD Taking Active   gabapentin (NEURONTIN) 300 MG capsule [id number] Yes TAKE 1 CAPSULE BY MOUTH IN THE MORNING, 1 CAPSULE IN THE EVENING AND 2 CAPSULES AT [place], Drue Stager, MD Taking Active   glucose blood [facility] VERIO) test strip [id number] Yes USE AS DIRECTED Steele Sizer, MD Taking Active   Lancets [facility] ULTRASOFT) lancets [id number] Yes Use as instructed Steele Sizer, MD Taking Active   methocarbamol (ROBAXIN) 500 MG tablet [id number] Yes Take 1 tablet (500 mg total) by mouth every 6 (six) hours as needed. Johnn Hai, PA-C Taking Active   nystatin cream (MYCOSTATIN) [id number] Yes Apply 1 Application topically 2 (two) times daily. Rosario Adie, MD Taking Active   oxybutynin (DITROPAN XL) 15 MG 24 hr tablet [id number] Yes Take 1 tablet (15 mg total) by mouth daily. Hollice Espy, MD Taking Active   potassium chloride SA (KLOR-CON M) 20 MEQ tablet [id number] Yes TAKE 1 TABLET BY MOUTH DAILY Sowles, Drue Stager, MD Taking Active   traMADol (ULTRAM) 50 MG tablet [id number] Yes Take 25-50 mg by mouth 2 (two) times daily as needed. [provider] Taking Active            Med Note Daron Offer A   Wed Mar 04, 2022  3:03 PM)  Prescribed by Allene Dillon, NP  triamcinolone ointment (KENALOG) 0.1 % [id number] Yes APPLY TOPICALLY TWICE DAILY AS NEEDED. GENERIC EQUIVALENT FOR Martha Clan, Drue Stager, MD Taking Active   vitamin B-12 (CYANOCOBALAMIN) 500 MCG tablet [id number] Yes Take 500 mcg  by mouth daily. [provider]  Taking Active   Vitamin D, Cholecalciferol, 25 MCG (1000 UT) TABS [id number] Yes Take 1 capsule by mouth daily. Steele Sizer, MD Taking Active             Patient Active Problem List   Diagnosis Date Noted   Greater trochanteric bursitis, left 06/18/2022   Gait instability 12/13/2020   Stage 3a chronic kidney disease ([place]) 12/13/2020   Cellulitis of right ankle 04/12/2020   Morbid obesity with BMI of 45.0-49.9, adult ([place]) 02/06/2020   History of gastritis 08/14/2019   Angiodysplasia of stomach and duodenum    Iron deficiency anemia 04/20/2018   Anemia, unspecified 08/12/8526   Lichen sclerosus of female genitalia 06/29/2016   Primary osteoarthritis of both knees 04/07/2016   Special screening for malignant neoplasms, colon    Right thyroid nodule 06/26/2015   Asthma, intermittent 04/03/2015   Benign essential HTN 04/03/2015   Carpal tunnel syndrome 04/03/2015   Chronic kidney disease (CKD), stage II (mild) 04/03/2015   Controlled gout 04/03/2015   Arteriosclerosis of coronary artery 04/03/2015   Diabetes mellitus with renal manifestations, controlled ([place]) 04/03/2015   Dyslipidemia 04/03/2015   Edema extremities 04/03/2015   Elevated sedimentation rate 04/03/2015   Knee pain 04/03/2015   Lumbar radiculitis 04/03/2015   Obstructive apnea 04/03/2015   Lumbosacral spondylosis 04/03/2015   Osteoarthritis, chronic 04/03/2015   Psoriasis 04/03/2015   Vitamin D deficiency 04/03/2015   Varicose veins 04/03/2015   Goiter 12/23/2014   Degeneration of intervertebral disc of lumbar region 08/23/2014   Corns and callosity 06/11/2009    Immunization History  Administered Date(s) Administered   Fluad Quad(high Dose 65+) 06/20/2020, 06/24/2021, 07/07/2022   Influenza, High Dose Seasonal PF 06/19/2015, 06/29/2016, 06/14/2017, 06/17/2018, 06/22/2019   Influenza-Unspecified 06/17/2011, 07/11/2019   Moderna SARS-COV2 Booster Vaccination  07/21/2022   PFIZER(Purple Top)SARS-COV-2 Vaccination 12/11/2019, 01/01/2020, 07/16/2020, 02/03/2021   Pfizer Covid-19 Vaccine Bivalent Booster 16yr & up 07/03/2021   Pneumococcal Conjugate-13 11/24/2011, 11/23/2013, 04/09/2017   Pneumococcal Polysaccharide-23 08/30/2009, 08/30/2009   Td 08/30/2009   Tdap 08/30/2009, 08/12/2020   Zoster Recombinat (Shingrix) 07/11/2019, 09/21/2019   Zoster, Live 02/12/2011    Conditions to be addressed/monitored:  Hypertension, Hyperlipidemia, Diabetes, Asthma, Chronic Kidney Disease, Osteoarthritis, Gout, and Chronic Pain  There are no care plans that you recently modified to display for this patient.   Medication Assistance:  Trulicity obtained through [employer] assistance program.  Enrollment ends Dec 2023  Compliance/Adherence/Medication fill history: Care Gaps: Covid Booster  Star-Rating Drugs: Atorvastatin 40 mg last filled on  11/25/2021 for a 90-Day supply with [facility] Benazepril 5 mg last filled on  01/26/2022 for a 90-Day supply with [facility]   [facility]:  [facility] [place] [address] [address]Phone: [phone]: [phone] ATedd Sias([place] [address]Phone: [phone]: [phone]  Uses pill box? Yes Pt endorses 100% compliance  We discussed: Current pharmacy is preferred with insurance plan and patient is satisfied with pharmacy services Patient decided to: Continue current medication management strategy  Care Plan and Follow Up Patient Decision:  Patient agrees to Care Plan and Follow-up.  Plan: Telephone follow up appointment with care management team member scheduled for:  08/19/2022 at 3:00 PM  AMalva Limes [place] Pharmacist Practitioner  [facility]  [phone]  Current Barriers:  Unable to independently afford treatment regimen  Pharmacist Clinical Goal(s):  Patient will verbalize ability to afford treatment regimen maintain control of diabetes as evidenced by A1c less than 8%  through collaboration with PharmD and provider.   Interventions: 1:1 collaboration with Steele Sizer, MD regarding development and update of comprehensive plan of care as evidenced by provider attestation and co-signature Inter-disciplinary care team collaboration (see longitudinal plan of care) Comprehensive medication review performed; medication list updated in electronic medical record  Hypertension (BP goal <140/90) -Controlled -Current treatment: Furosemide 40 mg daily  Benazepril 5 mg daily  -Medications previously tried: NA  -Current home readings: has not been monitoring regularly  -Denies hypotensive/hypertensive symptoms -Counseled to monitor BP at home weekly, document, and provide log at future appointments -Recommended to continue current medication  Hyperlipidemia: (LDL goal < 100) -Controlled -Current treatment: Atorvastatin 40 mg daily: Appropriate, Effective, Safe, Accessible   -Medications previously tried: NA  -Recommended to continue current medication  Diabetes (A1c goal <8%) -Controlled -Current medications: Trulicity 1.5 mg weekly: Appropriate, Effective, Safe, Accessible  -Medications previously tried: NA  -Current home glucose readings fasting glucose: 125 post prandial glucose: NA -Denies hypoglycemic/hyperglycemic symptoms -Recommended to continue current medication  Asthma (Goal: control symptoms and prevent exacerbations) -Controlled -Current treatment  Ventolin HFA  Albuterol Nebulizer twice daily  Budesonide nebulizer twice daily  -Medications previously tried: NA  -Exacerbations requiring treatment in last 6 months: NA -Patient reports consistent  use of maintenance inhaler -Frequency of rescue inhaler use: Sporadic, varies based on activity -Counseled on Benefits of consistent maintenance inhaler use -Recommended to continue current medication  Chronic Pain (Goal: Maintain adequate pain control) -Not ideally controlled -Current treatment  Diclofenac 1 % gel three times daily  Gabapentin 500 mg 1 tablet AM, 1 PM, 2 HS  Acetaminophen 500 mg every 6 hours as needed  -Medications previously tried: NA -Patient with worsening hip pain recently. Tries to limit gabapentin when possible.  -Counseled to limit APAP to 3000 mg daily   -Recommended to continue current medication  Chronic Kidney Disease Stage 3a  -All medications assessed for renal dosing and appropriateness in chronic kidney disease. -Recommended to continue current medication  Patient Goals/Self-Care Activities Patient will:  - check glucose daily, document, and provide at future appointments  Follow Up Plan: ***

## 2022-08-26 NOTE — Telephone Encounter (Signed)
-----   Message from Rubie Maid, MD sent at 08/25/2022  7:14 PM EST ----- Regarding: RE: No, she doesn't have to keep her appointment if they already reviewed it. However if she continues to have any further episodes of bleeding then she will need to follow up. ----- Message ----- From: Audree Camel Sent: 08/25/2022   2:27 PM EST To: Rubie Maid, MD  Patient is call to see if she needs her  scheduled appointment for 09/02/22 to go over results. Patient is stating her pcp has review her biopsy results. She is wanting to know if she still needs this appointment or if it can be virtual. Please advise?

## 2022-08-26 NOTE — Telephone Encounter (Signed)
I contacted patient via phone. . I let patient know per what Dr. Marcelline Mates has advised. Patient's appointment has been cancelled per patient's request.

## 2022-08-28 DIAGNOSIS — M5416 Radiculopathy, lumbar region: Secondary | ICD-10-CM | POA: Diagnosis not present

## 2022-08-28 DIAGNOSIS — J069 Acute upper respiratory infection, unspecified: Secondary | ICD-10-CM | POA: Diagnosis not present

## 2022-08-28 DIAGNOSIS — M5136 Other intervertebral disc degeneration, lumbar region: Secondary | ICD-10-CM | POA: Diagnosis not present

## 2022-08-28 DIAGNOSIS — R059 Cough, unspecified: Secondary | ICD-10-CM | POA: Diagnosis not present

## 2022-08-31 ENCOUNTER — Telehealth: Payer: Self-pay | Admitting: Family Medicine

## 2022-08-31 ENCOUNTER — Telehealth: Payer: Self-pay

## 2022-08-31 NOTE — Telephone Encounter (Unsigned)
Copied from [place] [phone]. Topic: General - Other >> Aug 31, 2022 10:30 AM Cyndi Bender wrote: Reason for CRM: Pt requests that the paperwork for Dulaglutide (TRULICITY) 1.5 JU/1.2QU SOPN be sent to her because it is time for it to be renewed.

## 2022-08-31 NOTE — Progress Notes (Signed)
    Chronic Care Management Pharmacy Assistant   Name: Amber Avery  MRN: [id number] DOB: April 21, 1946  Reason for Encounter: 2024 Patient Assistance Renewal Trulicity   Patient is currently on patient assistance through [facility] for her Trulicity.[facility] [employer] requires all Medicare patients to submit a renewal application for the 7846 year before 10/18/2022.  Renewal Application started, and will be mailed to the patient's home address for her to complete. Once completed by the patient she will be instructed to return the application along with her proof of income to [place] at [facility].  Application e-mailed to PTM so she can mail the application to the patient's home address. Patient can contact me directly @ [phone] if she has any questions.    Medications: Outpatient Encounter Medications as of 08/31/2022  Medication Sig Note   acetaminophen (TYLENOL) 500 MG tablet Take 1 tablet (500 mg total) by mouth every 6 (six) hours as needed. (Patient taking differently: Take 1,000 mg by mouth every 8 (eight) hours.)    albuterol (VENTOLIN HFA) 108 (90 Base) MCG/ACT inhaler Inhale 2 puffs into the lungs every 6 (six) hours as needed for wheezing or shortness of breath.    allopurinol (ZYLOPRIM) 300 MG tablet TAKE 1 TABLET BY MOUTH DAILY    atorvastatin (LIPITOR) 20 MG tablet Take 1 tablet (20 mg total) by mouth daily. Daily for cholesterol    benazepril (LOTENSIN) 5 MG tablet TAKE 1 TABLET BY MOUTH DAILY    Blood Pressure KIT 1 each by Does not apply route daily.    dapagliflozin propanediol (FARXIGA) 10 MG TABS tablet Take 1 tablet (10 mg total) by mouth daily before breakfast.    diclofenac Sodium (VOLTAREN) 1 % GEL APPLY 4 GRAMS TOPICALLY FOUR TIMES DAILY AS DIRECTED GENERIC EQUIVALENT FOR VOL.TAREN    Dulaglutide (TRULICITY) 1.5 KG/4.0NU SOPN Inject 1.5 mg into the skin once a week.    furosemide (LASIX) 40 MG tablet TAKE 1 TABLET BY MOUTH DAILY     gabapentin (NEURONTIN) 300 MG capsule TAKE 1 CAPSULE BY MOUTH IN THE MORNING, 1 CAPSULE IN THE EVENING AND 2 CAPSULES AT NIGHT    glucose blood (ONETOUCH VERIO) test strip USE AS DIRECTED    Lancets (ONETOUCH ULTRASOFT) lancets Use as instructed    methocarbamol (ROBAXIN) 500 MG tablet Take 1 tablet (500 mg total) by mouth every 6 (six) hours as needed.    nystatin cream (MYCOSTATIN) Apply 1 Application topically 2 (two) times daily.    oxybutynin (DITROPAN XL) 15 MG 24 hr tablet Take 1 tablet (15 mg total) by mouth daily.    potassium chloride SA (KLOR-CON M) 20 MEQ tablet TAKE 1 TABLET BY MOUTH DAILY    traMADol (ULTRAM) 50 MG tablet Take 25-50 mg by mouth 2 (two) times daily as needed. 03/04/2022: Prescribed by Allene Dillon, NP   triamcinolone ointment (KENALOG) 0.1 % APPLY TOPICALLY TWICE DAILY AS NEEDED. GENERIC EQUIVALENT FOR KENALOG    vitamin B-12 (CYANOCOBALAMIN) 500 MCG tablet Take 500 mcg by mouth daily.    Vitamin D, Cholecalciferol, 25 MCG (1000 UT) TABS Take 1 capsule by mouth daily.    No facility-administered encounter medications on file as of 08/31/2022.   Lynann Bologna, CPA/CMA Clinical Pharmacist Assistant Phone: [phone]

## 2022-09-02 ENCOUNTER — Ambulatory Visit: Payer: Medicare Other | Admitting: Obstetrics and Gynecology

## 2022-09-02 ENCOUNTER — Encounter: Payer: Self-pay | Admitting: Urology

## 2022-09-02 ENCOUNTER — Ambulatory Visit: Payer: Medicare Other | Admitting: Urology

## 2022-09-02 VITALS — BP 150/75 | HR 85 | Ht 61.0 in | Wt 268.0 lb

## 2022-09-02 DIAGNOSIS — N3941 Urge incontinence: Secondary | ICD-10-CM

## 2022-09-02 LAB — BLADDER SCAN AMB NON-IMAGING

## 2022-09-02 NOTE — Progress Notes (Signed)
09/02/2022 3:33 PM   Amber Avery Dec 31, 1945 [id number]  Referring provider: Steele Sizer, MD [address] [place],  [place] [address]  Chief Complaint  Patient presents with   Urinary Incontinence    HPI: 76 year old female with a personal history of urinary urgency frequency urge incontinence and enuresis who presents today for follow-up.  At last visit, we increased her oxybutynin to 15 mg XL.  She reports that she is extremely pleased with the result.  He is not having any issues with dry eyes dry mouth or constipation.  She does have now time to get to the bathroom and has had significantly fewer accidents.  She is very pleased with this medication.  PVR 0   PMH: Past Medical History:  Diagnosis Date   Asthma    Diabetes mellitus without complication ([place])    Gout    Hyperlipidemia    Hypertension    Iron deficiency anemia 04/20/2018   PONV (postoperative nausea and vomiting)    after hemorroid surgery   Shortness of breath dyspnea    Sleep apnea     Surgical History: Past Surgical History:  Procedure Laterality Date   APPENDECTOMY     CATARACT EXTRACTION W/PHACO Left 08/14/2020   Procedure: CATARACT EXTRACTION PHACO AND INTRAOCULAR LENS PLACEMENT ([facility]) LEFT DIABETIC 3.56 00:59.6 6.0%;  Surgeon: Leandrew Koyanagi, MD;  Location: [facility];  Service: Ophthalmology;  Laterality: Left;  Diabetic   CATARACT EXTRACTION W/PHACO Right 09/04/2020   Procedure: CATARACT EXTRACTION PHACO AND INTRAOCULAR LENS PLACEMENT ([place]) RIGHT DIABETIC;  Surgeon: Leandrew Koyanagi, MD;  Location: [facility];  Service: Ophthalmology;  Laterality: Right;  4.72 1:03.2 7.4%   CHOLECYSTECTOMY  1977   COLONOSCOPY WITH PROPOFOL N/A 01/21/2016   Procedure: COLONOSCOPY WITH PROPOFOL;  Surgeon: Lucilla Lame, MD;  Location: [facility];  Service: Endoscopy;  Laterality: N/A;   DILATION AND CURETTAGE OF UTERUS     Due to Amenorrhea    ESOPHAGOGASTRODUODENOSCOPY (EGD) WITH PROPOFOL N/A 08/16/2018   Procedure: ESOPHAGOGASTRODUODENOSCOPY (EGD) WITH PROPOFOL;  Surgeon: Lucilla Lame, MD;  Location: [facility];  Service: Endoscopy;  Laterality: N/A;   HEMORRHOID BANDING  1980   HEMORROIDECTOMY     HERNIA REPAIR  2011   Temporal Area Excision Biopsy     For Birth Mark Changes, Negative Pathology   TONSILLECTOMY AND ADENOIDECTOMY      Home Medications:  Allergies as of 09/02/2022       Reactions   Codeine Hives, Nausea And Vomiting        Medication List        Accurate as of September 02, 2022 11:59 PM. If you have any questions, ask your nurse or doctor.          acetaminophen 500 MG tablet Commonly known as: TYLENOL Take 1 tablet (500 mg total) by mouth every 6 (six) hours as needed. What changed:  how much to take when to take this   albuterol 108 (90 Base) MCG/ACT inhaler Commonly known as: VENTOLIN HFA Inhale 2 puffs into the lungs every 6 (six) hours as needed for wheezing or shortness of breath.   allopurinol 300 MG tablet Commonly known as: ZYLOPRIM TAKE 1 TABLET BY MOUTH DAILY   atorvastatin 20 MG tablet Commonly known as: LIPITOR Take 1 tablet (20 mg total) by mouth daily. Daily for cholesterol   benazepril 5 MG tablet Commonly known as: LOTENSIN TAKE 1 TABLET BY MOUTH DAILY   Blood Pressure Kit 1 each by Does not  apply route daily.   dapagliflozin propanediol 10 MG Tabs tablet Commonly known as: Farxiga Take 1 tablet (10 mg total) by mouth daily before breakfast.   diclofenac Sodium 1 % Gel Commonly known as: VOLTAREN APPLY 4 GRAMS TOPICALLY FOUR TIMES DAILY AS DIRECTED GENERIC EQUIVALENT FOR VOL.TAREN   furosemide 40 MG tablet Commonly known as: LASIX TAKE 1 TABLET BY MOUTH DAILY   gabapentin 300 MG capsule Commonly known as: NEURONTIN TAKE 1 CAPSULE BY MOUTH IN THE MORNING, 1 CAPSULE IN THE EVENING AND 2 CAPSULES AT NIGHT   methocarbamol 500 MG tablet Commonly known  as: Robaxin Take 1 tablet (500 mg total) by mouth every 6 (six) hours as needed.   nystatin cream Commonly known as: MYCOSTATIN Apply 1 Application topically 2 (two) times daily.   onetouch ultrasoft lancets Use as instructed   OneTouch Verio test strip Generic drug: glucose blood USE AS DIRECTED   oxybutynin 15 MG 24 hr tablet Commonly known as: DITROPAN XL Take 1 tablet (15 mg total) by mouth daily.   potassium chloride SA 20 MEQ tablet Commonly known as: KLOR-CON M TAKE 1 TABLET BY MOUTH DAILY   traMADol 50 MG tablet Commonly known as: ULTRAM Take 25-50 mg by mouth 2 (two) times daily as needed.   triamcinolone ointment 0.1 % Commonly known as: KENALOG APPLY TOPICALLY TWICE DAILY AS NEEDED. GENERIC EQUIVALENT FOR KENALOG   Trulicity 1.5 GY/1.7CB Sopn Generic drug: Dulaglutide Inject 1.5 mg into the skin once a week.   vitamin B-12 500 MCG tablet Commonly known as: CYANOCOBALAMIN Take 500 mcg by mouth daily.   Vitamin D (Cholecalciferol) 25 MCG (1000 UT) Tabs Take 1 capsule by mouth daily.        Allergies:  Allergies  Allergen Reactions   Codeine Hives and Nausea And Vomiting    Family History: Family History  Problem Relation Age of Onset   Cancer Mother        brain tumor   Kidney disease Mother    Hypertension Mother    Heart disease Father    COPD Father    Hypertension Sister    Arthritis Sister    Cancer Sister    Heart murmur Sister    GER disease Sister    Lupus Sister    Diabetes Sister    Arthritis Sister    Osteopenia Sister    Heart disease Sister    Hypertension Sister    Breast cancer Maternal Aunt 53   Leukemia Maternal Aunt     Social History:  reports that she quit smoking about 40 years ago. Her smoking use included cigarettes. She has a 30.00 pack-year smoking history. She has never used smokeless tobacco. She reports that she does not currently use alcohol. She reports that she does not use drugs.   Physical  Exam: BP (!) 150/75   Pulse 85   Ht _0  (1.549 m)   Wt 268 lb (121.6 kg)   BMI 50.64 kg/m   Constitutional:  Alert and oriented, No acute distress.  Ambulating with walker. HEENT: [place] AT, moist mucus membranes.  Trachea midline, no masses. Cardiovascular: No clubbing, cyanosis, or edema. Respiratory: Normal respiratory effort, no increased work of breathing. Neurologic: Grossly intact, no focal deficits, moving all 4 extremities. Psychiatric: Normal mood and affect.  Laboratory Data: Lab Results  Component Value Date   WBC 7.7 08/19/2022   HGB 12.8 08/19/2022   HCT 37.1 08/19/2022   MCV 95.6 08/19/2022   PLT 215 08/19/2022  Lab Results  Component Value Date   CREATININE 1.32 (H) 08/19/2022    Lab Results  Component Value Date   HGBA1C 7.0 (A) 08/19/2022    Urinalysis    Component Value Date/Time   APPEARANCEUR Clear 07/22/2022 1418   GLUCOSEU Negative 07/22/2022 1418   BILIRUBINUR Negative 07/22/2022 1418   PROTEINUR Negative 07/22/2022 1418   NITRITE Negative 07/22/2022 1418   LEUKOCYTESUR Negative 07/22/2022 1418    Lab Results  Component Value Date   LABMICR See below: 07/22/2022   WBCUA 0-5 07/22/2022   LABEPIT 0-10 07/22/2022   BACTERIA Moderate (A) 07/22/2022     Assessment & Plan:    1. Urge incontinence Improved doing well on oxybutynin 50 mg XL  PVR is minimal, emptying well  We will maintain this medication, however follow-up annually or sooner as needed - Bladder Scan (Post Void Residual) in office   Return in about 1 year (around 09/03/2023) for PVR with PA, OAB symptom recheck.  Hollice Espy, MD  [facility] [address], [place], [place] [address] [phone]

## 2022-09-03 MED ORDER — OXYBUTYNIN CHLORIDE ER 15 MG PO TB24: 15.0000 mg | ORAL_TABLET | Freq: Every day | ORAL | 3 refills | Status: DC

## 2022-09-16 DIAGNOSIS — J449 Chronic obstructive pulmonary disease, unspecified: Secondary | ICD-10-CM | POA: Diagnosis not present

## 2022-09-18 ENCOUNTER — Telehealth: Payer: Self-pay | Admitting: Family Medicine

## 2022-09-18 NOTE — Telephone Encounter (Signed)
Unsure of what form she is referring to. She stated she will drop off forms for [place] to review on Monday. I also let her know the pill Dr. Ancil Boozer prescribed was the [employer], but she said they needed an rx for that? Will re-assess once we receive the form.

## 2022-09-18 NOTE — Telephone Encounter (Signed)
Copied from [place] [id number]. Topic: General - Other >> Sep 18, 2022  1:47 PM Amber Avery wrote: Reason for CRM: Patient called in asking if she can bring from to the office or mail it for  [place] , also mentioned Dr Ancil Boozer wanted her to take a pill along with her shot. Please call back to discuss

## 2022-09-30 ENCOUNTER — Ambulatory Visit (INDEPENDENT_AMBULATORY_CARE_PROVIDER_SITE_OTHER): Payer: Medicare Other

## 2022-09-30 DIAGNOSIS — E1122 Type 2 diabetes mellitus with diabetic chronic kidney disease: Secondary | ICD-10-CM

## 2022-09-30 DIAGNOSIS — I10 Essential (primary) hypertension: Secondary | ICD-10-CM

## 2022-09-30 DIAGNOSIS — N183 Chronic kidney disease, stage 3 unspecified: Secondary | ICD-10-CM

## 2022-09-30 NOTE — Progress Notes (Signed)
Chronic Care Management Pharmacy Note  10/13/2022 Name:  Amber Avery MRN:  [id number] DOB:  [birth year]  Summary: Patient presents for CCM follow-up.   Recommendations/Changes made from today's visit: Continue Current Medications  Plan: CPP Follow-up in 6 months   Subjective: Amber Avery is an 76 y.o. year old female who is a primary patient of Steele Sizer, MD.  The CCM team was consulted for assistance with disease management and care coordination needs.    Engaged with patient by telephone for follow up visit in response to provider referral for pharmacy case management and/or care coordination services.   Consent to Services:  The patient was given information about Chronic Care Management services, agreed to services, and gave verbal consent prior to initiation of services.  Please see initial visit note for detailed documentation.   Patient Care Team: Steele Sizer, MD as PCP - General (Family Medicine) Bryson Ha, OD as Consulting Physician (Optometry) Sharlet Salina, MD as Consulting Physician (Physical Medicine and Rehabilitation) Germaine Pomfret, [facility] as Pharmacist (Pharmacist)  Recent office visits: 12/17/21: Patient presented to Dr. Ancil Boozer for follow-up.   Recent consult visits: 01/28/22: Patient presented to Allene Dillon, NP (Physical Medicine)  Hospital visits: 12/21/21: Patient presented to ED for back pain.   Objective:  Lab Results  Component Value Date   CREATININE 1.32 (H) 08/19/2022   BUN 30 (H) 08/19/2022   GFRNONAA 46 (L) 12/13/2020   GFRAA 53 (L) 12/13/2020   NA 145 08/19/2022   K 4.5 08/19/2022   CALCIUM 9.9 08/19/2022   CO2 32 08/19/2022   GLUCOSE 104 (H) 08/19/2022    Lab Results  Component Value Date/Time   HGBA1C 7.0 (A) 08/19/2022 01:24 PM   HGBA1C 6.4 (A) 04/22/2022 09:59 AM   HGBA1C 6.6 (H) 12/17/2021 11:39 AM   HGBA1C 6.0 04/14/2019 10:33 AM   HGBA1C 6.3 12/08/2018 11:22 AM   MICROALBUR 0.6 08/19/2022  02:00 PM   MICROALBUR 1.4 12/17/2021 11:39 AM   MICROALBUR 20 04/11/2018 10:53 AM   MICROALBUR 20 08/09/2017 09:57 AM    Last diabetic Eye exam:  Lab Results  Component Value Date/Time   HMDIABEYEEXA No Retinopathy 10/01/2021 12:00 AM    Last diabetic Foot exam: No results found for: "HMDIABFOOTEX"   Lab Results  Component Value Date   CHOL 137 08/19/2022   HDL 55 08/19/2022   LDLCALC 59 08/19/2022   TRIG 147 08/19/2022   CHOLHDL 2.5 08/19/2022       Latest Ref Rng & Units 08/19/2022    2:00 PM 12/17/2021   11:39 AM 12/13/2020   11:44 AM  Hepatic Function  Total Protein 6.1 - 8.1 g/dL 6.6  6.6  7.1   AST 10 - 35 U/L 20  24  34   ALT 6 - 29 U/L _0 Total Bilirubin 0.2 - 1.2 mg/dL 0.6  0.7  0.7     Lab Results  Component Value Date/Time   TSH 4.400 12/09/2015 10:10 AM   TSH 4.380 09/18/2015 11:39 AM       Latest Ref Rng & Units 08/19/2022    2:00 PM 12/17/2021   11:39 AM 12/13/2020   11:44 AM  CBC  WBC 3.8 - 10.8 Thousand/uL 7.7  6.9  6.1   Hemoglobin 11.7 - 15.5 g/dL 12.8  12.7  13.0   Hematocrit 35.0 - 45.0 % 37.1  38.0  38.9   Platelets 140 - 400 Thousand/uL 215  220  208  Lab Results  Component Value Date/Time   VD25OH 37 12/17/2021 11:39 AM   VD25OH 39 12/13/2020 11:44 AM    Clinical ASCVD: No  The 10-year ASCVD risk score (Arnett DK, et al., 2019) is: 33.9%   Values used to calculate the score:     Age: 22 years     Sex: Female     Is Non-Hispanic African American: No     Diabetic: Yes     Tobacco smoker: No     Systolic Blood Pressure: 349 mmHg     Is BP treated: Yes     HDL Cholesterol: 55 mg/dL     Total Cholesterol: 137 mg/dL       08/19/2022    1:20 PM 07/07/2022    1:04 PM 06/25/2022    9:53 AM  Depression screen PHQ 2/9  Decreased Interest 0 0 0  Down, Depressed, Hopeless 0 0 0  PHQ - 2 Score 0 0 0  Altered sleeping _0 Tired, decreased energy 1 1 0  Change in appetite 0 0 1  Feeling bad or failure about yourself  0 0 0   Trouble concentrating 0 0 0  Moving slowly or fidgety/restless 0 0 0  Suicidal thoughts 0 0 0  PHQ-9 Score _1 Social History   Tobacco Use  Smoking Status Former   Packs/day: 2.00   Years: 15.00   Total pack years: 30.00   Types: Cigarettes   Quit date: 72   Years since quitting: 41.0  Smokeless Tobacco Never  Tobacco Comments   smoking cessation materials not required   BP Readings from Last 3 Encounters:  09/02/22 (!) 150/75  08/19/22 122/70  08/12/22 (!) 116/50   Pulse Readings from Last 3 Encounters:  09/02/22 85  08/19/22 93  08/12/22 79   Wt Readings from Last 3 Encounters:  09/02/22 268 lb (121.6 kg)  08/19/22 268 lb 8 oz (121.8 kg)  08/12/22 267 lb (121.1 kg)   BMI Readings from Last 3 Encounters:  09/02/22 50.64 kg/m  08/19/22 50.73 kg/m  08/12/22 50.45 kg/m    Assessment/Interventions: Review of patient past medical history, allergies, medications, health status, including review of consultants reports, laboratory and other test data, was performed as part of comprehensive evaluation and provision of chronic care management services.   SDOH:  (Social Determinants of Health) assessments and interventions performed: Yes SDOH Interventions    Flowsheet Row Chronic Care Management from 09/03/2021 in [place] from 06/24/2021 in [place] Management from 10/03/2020 in [place] Management from 04/30/2020 in [place] Interventions -- Intervention Not Indicated -- --  Housing Interventions -- Intervention Not Indicated -- --  Transportation Interventions -- Intervention Not Indicated Intervention Not Indicated --  Financial Strain Interventions Other (Comment)  [PAP] Intervention Not Indicated Intervention Not Indicated Other (Comment)  [PAP as needed]  Physical Activity  Interventions -- Intervention Not Indicated -- --  Stress Interventions -- Intervention Not Indicated -- --  Social Connections Interventions -- Intervention Not Indicated -- --      SDOH Screenings   Food Insecurity: No Food Insecurity (07/07/2022)  Housing: Low Risk  (07/07/2022)  Transportation Needs: No Transportation Needs (06/24/2021)  Utilities: Not At Risk (07/07/2022)  Alcohol Screen: Low Risk  (06/24/2021)  Depression (PHQ2-9): Low Risk  (08/19/2022)  Financial Resource Strain: Low  Risk  (07/07/2022)  Physical Activity: Insufficiently Active (07/07/2022)  Social Connections: Socially Isolated (07/07/2022)  Stress: No Stress Concern Present (07/07/2022)  Tobacco Use: Medium Risk (09/02/2022)    CCM Care Plan  Allergies  Allergen Reactions   Codeine Hives and Nausea And Vomiting    Medications Reviewed Today     Reviewed by Steele Sizer, MD (Physician) on 08/19/22 at 1333  Med List Status: <None>   Medication Order Taking? Sig Documenting Provider Last Dose Status Informant  acetaminophen (TYLENOL) 500 MG tablet [id number] Yes Take 1 tablet (500 mg total) by mouth every 6 (six) hours as needed.  Patient taking differently: Take 1,000 mg by mouth every 8 (eight) hours.   Steele Sizer, MD Taking Active   albuterol (VENTOLIN HFA) 108 (90 Base) MCG/ACT inhaler [id number] Yes Inhale 2 puffs into the lungs every 6 (six) hours as needed for wheezing or shortness of breath. Steele Sizer, MD Taking Active   allopurinol (ZYLOPRIM) 300 MG tablet [id number] Yes TAKE 1 TABLET BY MOUTH DAILY Sowles, Drue Stager, MD Taking Active   atorvastatin (LIPITOR) 20 MG tablet [id number] Yes Take 1 tablet (20 mg total) by mouth daily. Daily for cholesterol Steele Sizer, MD Taking Active   benazepril (LOTENSIN) 5 MG tablet [id number] Yes TAKE 1 TABLET BY MOUTH DAILY Steele Sizer, MD Taking Active   Blood Pressure KIT [id number] Yes 1 each by Does not apply route daily. Steele Sizer, MD Taking  Active   diclofenac Sodium (VOLTAREN) 1 % GEL [id number] Yes APPLY 4 GRAMS TOPICALLY FOUR TIMES DAILY AS DIRECTED GENERIC EQUIVALENT FOR VOL.Lavonna Rua, Drue Stager, MD Taking Active   Dulaglutide (TRULICITY) 1.5 VQ/2.5ZD [facility] [facility] [id number] Yes Inject 1.5 mg into the skin once a week. Steele Sizer, MD Taking Active   furosemide (LASIX) 40 MG tablet [id number] Yes TAKE 1 TABLET BY MOUTH DAILY Sowles, Drue Stager, MD Taking Active   gabapentin (NEURONTIN) 300 MG capsule [id number] Yes TAKE 1 CAPSULE BY MOUTH IN THE MORNING, 1 CAPSULE IN THE EVENING AND 2 CAPSULES AT [place], Drue Stager, MD Taking Active   glucose blood [facility] VERIO) test strip [id number] Yes USE AS DIRECTED Steele Sizer, MD Taking Active   Lancets [facility] ULTRASOFT) lancets [id number] Yes Use as instructed Steele Sizer, MD Taking Active   methocarbamol (ROBAXIN) 500 MG tablet [id number] Yes Take 1 tablet (500 mg total) by mouth every 6 (six) hours as needed. Johnn Hai, PA-C Taking Active   nystatin cream (MYCOSTATIN) [id number] Yes Apply 1 Application topically 2 (two) times daily. Rosario Adie, MD Taking Active   oxybutynin (DITROPAN XL) 15 MG 24 hr tablet [id number] Yes Take 1 tablet (15 mg total) by mouth daily. Hollice Espy, MD Taking Active   potassium chloride SA (KLOR-CON M) 20 MEQ tablet [id number] Yes TAKE 1 TABLET BY MOUTH DAILY Sowles, Drue Stager, MD Taking Active   traMADol (ULTRAM) 50 MG tablet [id number] Yes Take 25-50 mg by mouth 2 (two) times daily as needed. [provider] Taking Active            Med Note Daron Offer A   Wed Mar 04, 2022  3:03 PM)  Prescribed by Allene Dillon, NP  triamcinolone ointment (KENALOG) 0.1 % [id number] Yes APPLY TOPICALLY TWICE DAILY AS NEEDED. GENERIC EQUIVALENT FOR Martha Clan, Drue Stager, MD Taking Active   vitamin B-12 (CYANOCOBALAMIN) 500 MCG tablet [id number] Yes Take 500 mcg by mouth daily. [provider] Taking Active   Vitamin D,  Cholecalciferol, 25 MCG (1000 UT) TABS [id number] Yes Take 1 capsule by  mouth daily. Steele Sizer, MD Taking Active             Patient Active Problem List   Diagnosis Date Noted   Greater trochanteric bursitis, left 06/18/2022   Gait instability 12/13/2020   Stage 3a chronic kidney disease ([place]) 12/13/2020   Cellulitis of right ankle 04/12/2020   Morbid obesity with BMI of 45.0-49.9, adult ([place]) 02/06/2020   History of gastritis 08/14/2019   Angiodysplasia of stomach and duodenum    Iron deficiency anemia 04/20/2018   Anemia, unspecified 95/63/8756   Lichen sclerosus of female genitalia 06/29/2016   Primary osteoarthritis of both knees 04/07/2016   Special screening for malignant neoplasms, colon    Right thyroid nodule 06/26/2015   Asthma, intermittent 04/03/2015   Benign essential HTN 04/03/2015   Carpal tunnel syndrome 04/03/2015   Chronic kidney disease (CKD), stage II (mild) 04/03/2015   Controlled gout 04/03/2015   Arteriosclerosis of coronary artery 04/03/2015   Diabetes mellitus with renal manifestations, controlled ([place]) 04/03/2015   Dyslipidemia 04/03/2015   Edema extremities 04/03/2015   Elevated sedimentation rate 04/03/2015   Knee pain 04/03/2015   Lumbar radiculitis 04/03/2015   Obstructive apnea 04/03/2015   Lumbosacral spondylosis 04/03/2015   Osteoarthritis, chronic 04/03/2015   Psoriasis 04/03/2015   Vitamin D deficiency 04/03/2015   Varicose veins 04/03/2015   Goiter 12/23/2014   Degeneration of intervertebral disc of lumbar region 08/23/2014   Corns and callosity 06/11/2009    Immunization History  Administered Date(s) Administered   Fluad Quad(high Dose 65+) 06/20/2020, 06/24/2021, 07/07/2022   Influenza, High Dose Seasonal PF 06/19/2015, 06/29/2016, 06/14/2017, 06/17/2018, 06/22/2019   Influenza-Unspecified 06/17/2011, 07/11/2019   Moderna SARS-COV2 Booster Vaccination 07/21/2022   PFIZER(Purple Top)SARS-COV-2 Vaccination 12/11/2019,  01/01/2020, 07/16/2020, 02/03/2021   Pfizer Covid-19 Vaccine Bivalent Booster 41yr & up 07/03/2021   Pneumococcal Conjugate-13 11/24/2011, 11/23/2013, 04/09/2017   Pneumococcal Polysaccharide-23 08/30/2009, 08/30/2009   Td 08/30/2009   Tdap 08/30/2009, 08/12/2020   Zoster Recombinat (Shingrix) 07/11/2019, 09/21/2019   Zoster, Live 02/12/2011    Conditions to be addressed/monitored:  Hypertension, Hyperlipidemia, Diabetes, Asthma, Chronic Kidney Disease, Osteoarthritis, Gout, and Chronic Pain  Care Plan : General Pharmacy (Adult)  Updates made by FGermaine Pomfret RPH since 10/13/2022 12:00 AM     Problem: Hypertension, Hyperlipidemia, Diabetes, Asthma, Chronic Kidney Disease, Osteoarthritis, Gout, and Chronic Pain   Priority: High     Long-Range Goal: Patient-Specific Goal   Start Date: 04/03/2021  Expected End Date: 10/14/2023  This Visit's Progress: On track  Recent Progress: On track  Priority: High  Note:   Current Barriers:  Unable to independently afford treatment regimen  Pharmacist Clinical Goal(s):  Patient will verbalize ability to afford treatment regimen maintain control of diabetes as evidenced by A1c less than 8%  through collaboration with PharmD and provider.   Interventions: 1:1 collaboration with SSteele Sizer MD regarding development and update of comprehensive plan of care as evidenced by provider attestation and co-signature Inter-disciplinary care team collaboration (see longitudinal plan of care) Comprehensive medication review performed; medication list updated in electronic medical record  Hypertension (BP goal <140/90) -Controlled -Current treatment: Furosemide 40 mg daily  Benazepril 5 mg daily  -Medications previously tried: NA  -Current home readings:  130/67 114/59 -Denies hypotensive/hypertensive symptoms -Counseled to monitor BP at home weekly, document, and provide log at future appointments -Recommended to continue current  medication  Hyperlipidemia: (LDL goal < 100) -Controlled -Current treatment: Atorvastatin 40 mg daily: Appropriate, Effective, Safe, Accessible   -Medications previously tried: NA  -  Recommended to continue current medication  Diabetes (A1c goal <8%) -Controlled -Current medications: Trulicity 1.5 mg weekly: Appropriate, Effective, Safe, Accessible  -Medications previously tried: NA  -Current home glucose readings fasting glucose: 140s  post prandial glucose: NA -Denies hypoglycemic/hyperglycemic symptoms -Has not started on Farxiga, still working on PAP.  -Patient reports being mostly inactive throughout the day. She has been eating junk food lately, and gained a few pounds.  -Recommended to continue current medication  Asthma (Goal: control symptoms and prevent exacerbations) -Controlled -Current treatment  Ventolin HFA  Albuterol Nebulizer twice daily  Budesonide nebulizer twice daily  -Medications previously tried: NA  -Exacerbations requiring treatment in last 6 months: NA -Patient reports consistent use of maintenance inhaler -Frequency of rescue inhaler use: Sporadic, varies based on activity -Counseled on Benefits of consistent maintenance inhaler use -Recommended to continue current medication  Chronic Pain (Goal: Maintain adequate pain control) -Not ideally controlled -Current treatment  Diclofenac 1 % gel three times daily  Gabapentin 500 mg 1 tablet AM, 1 PM, 2 HS  Acetaminophen 500 mg every 6 hours as needed  -Medications previously tried: NA -Patient with worsening hip pain recently. Tries to limit gabapentin when possible.  -Counseled to limit APAP to 3000 mg daily   -Recommended to continue current medication  Chronic Kidney Disease Stage 3a  -All medications assessed for renal dosing and appropriateness in chronic kidney disease. -Recommended to continue current medication  Patient Goals/Self-Care Activities Patient will:  - check glucose daily,  document, and provide at future appointments  Follow Up Plan: Telephone follow up appointment with care management team member scheduled for:  03/31/2023 at 3:00 PM     Medication Assistance:  Trulicity obtained through [employer] medication assistance program.  Enrollment ends Dec 2023  Compliance/Adherence/Medication fill history: Care Gaps: Covid Booster  Star-Rating Drugs: Atorvastatin 40 mg last filled on  11/25/2021 for a 90-Day supply with [facility] Benazepril 5 mg last filled on  01/26/2022 for a 90-Day supply with [facility]   Patient's preferred pharmacy is:  [facility] [address] ([address]), [place]) [place] [address] Phone: [phone] Fax: [phone]  [facility] ([place]) [place], [place] [address] Phone: [phone] Fax: [phone]   Uses pill box? Yes Pt endorses 100% compliance  We discussed: Current pharmacy is preferred with insurance plan and patient is satisfied with pharmacy services Patient decided to: Continue current medication management strategy  Care Plan and Follow Up Patient Decision:  Patient agrees to Care Plan and Follow-up.  Plan: Telephone follow up appointment with care management team member scheduled for:  03/31/2023 at 3:00 PM  Malva Limes, [place] Pharmacist Practitioner  [facility] [phone]

## 2022-10-02 DIAGNOSIS — E119 Type 2 diabetes mellitus without complications: Secondary | ICD-10-CM | POA: Diagnosis not present

## 2022-10-02 DIAGNOSIS — H524 Presbyopia: Secondary | ICD-10-CM | POA: Diagnosis not present

## 2022-10-02 DIAGNOSIS — Z961 Presence of intraocular lens: Secondary | ICD-10-CM | POA: Diagnosis not present

## 2022-10-02 DIAGNOSIS — Z0100 Encounter for examination of eyes and vision without abnormal findings: Secondary | ICD-10-CM | POA: Diagnosis not present

## 2022-10-02 DIAGNOSIS — H04123 Dry eye syndrome of bilateral lacrimal glands: Secondary | ICD-10-CM | POA: Diagnosis not present

## 2022-10-05 DIAGNOSIS — M48062 Spinal stenosis, lumbar region with neurogenic claudication: Secondary | ICD-10-CM | POA: Diagnosis not present

## 2022-10-05 DIAGNOSIS — M5416 Radiculopathy, lumbar region: Secondary | ICD-10-CM | POA: Diagnosis not present

## 2022-10-06 ENCOUNTER — Telehealth: Payer: Self-pay

## 2022-10-06 LAB — HM DIABETES EYE EXAM

## 2022-10-06 NOTE — Progress Notes (Signed)
    Chronic Care Management Pharmacy Assistant   Name: Amber Avery  MRN: [id number] DOB: Dec 07, 1945  Reason for Encounter: Patient Assistance Application Follow-up   Junius Argyle, CPP sent a message advising that he sent the patient's actual proof of income over to [employer]. I contacted [employer] and the representative confirmed that the proof of income was received and the application has been placed back in review.   Per the representative the application will take about 3-5 days before it is reviewed for update.  Medications: Outpatient Encounter Medications as of 10/06/2022  Medication Sig Note   acetaminophen (TYLENOL) 500 MG tablet Take 1 tablet (500 mg total) by mouth every 6 (six) hours as needed. (Patient taking differently: Take 1,000 mg by mouth every 8 (eight) hours.)    albuterol (VENTOLIN HFA) 108 (90 Base) MCG/ACT inhaler Inhale 2 puffs into the lungs every 6 (six) hours as needed for wheezing or shortness of breath.    allopurinol (ZYLOPRIM) 300 MG tablet TAKE 1 TABLET BY MOUTH DAILY    atorvastatin (LIPITOR) 20 MG tablet Take 1 tablet (20 mg total) by mouth daily. Daily for cholesterol    benazepril (LOTENSIN) 5 MG tablet TAKE 1 TABLET BY MOUTH DAILY    Blood Pressure KIT 1 each by Does not apply route daily.    dapagliflozin propanediol (FARXIGA) 10 MG TABS tablet Take 1 tablet (10 mg total) by mouth daily before breakfast.    diclofenac Sodium (VOLTAREN) 1 % GEL APPLY 4 GRAMS TOPICALLY FOUR TIMES DAILY AS DIRECTED GENERIC EQUIVALENT FOR VOL.TAREN    Dulaglutide (TRULICITY) 1.5 PT/4.6FK SOPN Inject 1.5 mg into the skin once a week.    furosemide (LASIX) 40 MG tablet TAKE 1 TABLET BY MOUTH DAILY    gabapentin (NEURONTIN) 300 MG capsule TAKE 1 CAPSULE BY MOUTH IN THE MORNING, 1 CAPSULE IN THE EVENING AND 2 CAPSULES AT NIGHT    glucose blood (ONETOUCH VERIO) test strip USE AS DIRECTED    Lancets (ONETOUCH ULTRASOFT) lancets Use as instructed    methocarbamol (ROBAXIN) 500 MG  tablet Take 1 tablet (500 mg total) by mouth every 6 (six) hours as needed.    nystatin cream (MYCOSTATIN) Apply 1 Application topically 2 (two) times daily.    oxybutynin (DITROPAN XL) 15 MG 24 hr tablet Take 1 tablet (15 mg total) by mouth daily.    potassium chloride SA (KLOR-CON M) 20 MEQ tablet TAKE 1 TABLET BY MOUTH DAILY    traMADol (ULTRAM) 50 MG tablet Take 25-50 mg by mouth 2 (two) times daily as needed. 03/04/2022: Prescribed by Allene Dillon, NP   triamcinolone ointment (KENALOG) 0.1 % APPLY TOPICALLY TWICE DAILY AS NEEDED. GENERIC EQUIVALENT FOR KENALOG    vitamin B-12 (CYANOCOBALAMIN) 500 MCG tablet Take 500 mcg by mouth daily.    Vitamin D, Cholecalciferol, 25 MCG (1000 UT) TABS Take 1 capsule by mouth daily.    No facility-administered encounter medications on file as of 10/06/2022.    Lynann Bologna, CPA/CMA Clinical Pharmacist Assistant Phone: [phone]

## 2022-10-13 NOTE — Patient Instructions (Signed)
Visit Information It was great speaking with you today!  Please let me know if you have any questions about our visit.   Goals Addressed             This Visit's Progress    Monitor and Manage My Blood Sugar-Diabetes Type 2   On track    Timeframe:  Long-Range Goal Priority:  High Start Date: 04/04/2021                            Expected End Date: 10/05/2023                      Follow Up within 90 days    - check blood sugar at prescribed times - check blood sugar if I feel it is too high or too low - enter blood sugar readings and medication or insulin into daily log    Why is this important?   Checking your blood sugar at home helps to keep it from getting very high or very low.  Writing the results in a diary or log helps the doctor know how to care for you.  Your blood sugar log should have the time, date and the results.  Also, write down the amount of insulin or other medicine that you take.  Other information, like what you ate, exercise done and how you were feeling, will also be helpful.     Notes:         Patient Care Plan: General Pharmacy (Adult)     Problem Identified: Hypertension, Hyperlipidemia, Diabetes, Asthma, Chronic Kidney Disease, Osteoarthritis, Gout, and Chronic Pain   Priority: High     Long-Range Goal: Patient-Specific Goal   Start Date: 04/03/2021  Expected End Date: 10/14/2023  This Visit's Progress: On track  Recent Progress: On track  Priority: High  Note:   Current Barriers:  Unable to independently afford treatment regimen  Pharmacist Clinical Goal(s):  Patient will verbalize ability to afford treatment regimen maintain control of diabetes as evidenced by A1c less than 8%  through collaboration with PharmD and provider.   Interventions: 1:1 collaboration with Steele Sizer, MD regarding development and update of comprehensive plan of care as evidenced by provider attestation and co-signature Inter-disciplinary care team  collaboration (see longitudinal plan of care) Comprehensive medication review performed; medication list updated in electronic medical record  Hypertension (BP goal <140/90) -Controlled -Current treatment: Furosemide 40 mg daily  Benazepril 5 mg daily  -Medications previously tried: NA  -Current home readings:  130/67 114/59 -Denies hypotensive/hypertensive symptoms -Counseled to monitor BP at home weekly, document, and provide log at future appointments -Recommended to continue current medication  Hyperlipidemia: (LDL goal < 100) -Controlled -Current treatment: Atorvastatin 40 mg daily: Appropriate, Effective, Safe, Accessible   -Medications previously tried: NA  -Recommended to continue current medication  Diabetes (A1c goal <8%) -Controlled -Current medications: Trulicity 1.5 mg weekly: Appropriate, Effective, Safe, Accessible  -Medications previously tried: NA  -Current home glucose readings fasting glucose: 140s  post prandial glucose: NA -Denies hypoglycemic/hyperglycemic symptoms -Has not started on Farxiga, still working on PAP.  -Patient reports being mostly inactive throughout the day. She has been eating junk food lately, and gained a few pounds.  -Recommended to continue current medication  Asthma (Goal: control symptoms and prevent exacerbations) -Controlled -Current treatment  Ventolin HFA  Albuterol Nebulizer twice daily  Budesonide nebulizer twice daily  -Medications previously tried: NA  -Exacerbations requiring treatment  in last 6 months: NA -Patient reports consistent use of maintenance inhaler -Frequency of rescue inhaler use: Sporadic, varies based on activity -Counseled on Benefits of consistent maintenance inhaler use -Recommended to continue current medication  Chronic Pain (Goal: Maintain adequate pain control) -Not ideally controlled -Current treatment  Diclofenac 1 % gel three times daily  Gabapentin 500 mg 1 tablet AM, 1 PM, 2 HS   Acetaminophen 500 mg every 6 hours as needed  -Medications previously tried: NA -Patient with worsening hip pain recently. Tries to limit gabapentin when possible.  -Counseled to limit APAP to 3000 mg daily   -Recommended to continue current medication  Chronic Kidney Disease Stage 3a  -All medications assessed for renal dosing and appropriateness in chronic kidney disease. -Recommended to continue current medication  Patient Goals/Self-Care Activities Patient will:  - check glucose daily, document, and provide at future appointments  Follow Up Plan: Telephone follow up appointment with care management team member scheduled for:  03/31/2023 at 3:00 PM      Patient agreed to services and verbal consent obtained.   Patient verbalizes understanding of instructions and care plan provided today and agrees to view in [place]. Active MyChart status and patient understanding of how to access instructions and care plan via MyChart confirmed with patient.     Malva Limes, [place] Pharmacist Practitioner  [facility] [phone]

## 2022-10-16 ENCOUNTER — Other Ambulatory Visit: Payer: Self-pay | Admitting: Family Medicine

## 2022-10-16 DIAGNOSIS — J449 Chronic obstructive pulmonary disease, unspecified: Secondary | ICD-10-CM | POA: Diagnosis not present

## 2022-10-16 DIAGNOSIS — E785 Hyperlipidemia, unspecified: Secondary | ICD-10-CM

## 2022-10-18 DIAGNOSIS — I10 Essential (primary) hypertension: Secondary | ICD-10-CM

## 2022-10-18 DIAGNOSIS — E1122 Type 2 diabetes mellitus with diabetic chronic kidney disease: Secondary | ICD-10-CM

## 2022-10-18 DIAGNOSIS — N183 Chronic kidney disease, stage 3 unspecified: Secondary | ICD-10-CM

## 2022-10-19 ENCOUNTER — Other Ambulatory Visit: Payer: Self-pay | Admitting: Family Medicine

## 2022-10-19 DIAGNOSIS — L409 Psoriasis, unspecified: Secondary | ICD-10-CM

## 2022-10-19 DIAGNOSIS — E785 Hyperlipidemia, unspecified: Secondary | ICD-10-CM

## 2022-10-21 ENCOUNTER — Telehealth: Payer: Self-pay | Admitting: Family Medicine

## 2022-10-21 NOTE — Telephone Encounter (Signed)
Patient is requesting to speak with Pharmacist Junius Argyle. She received a letter from [place] about her Medicare Part D about supplying her with Dulaglutide (TRULICITY) 1.5 BS/4.9QP SOPN  and Dapagliflozin Propanediol '10MG'$ . She is wanting to know if she should put both medications on her form?  Please advise

## 2022-10-27 ENCOUNTER — Telehealth: Payer: Self-pay

## 2022-10-27 NOTE — Telephone Encounter (Signed)
Unsure what form she is referencing. Will call patient.   Malva Limes, [place] Pharmacist Practitioner  [facility] [phone]

## 2022-10-27 NOTE — Progress Notes (Unsigned)
  Patient states she received a form from [facility] and she is asking if she needs to put [place] on there.Patient states she has not receive any thing for Trulicity and she will use her last pen on Thursday.Patient states the form states that she agreed not to get this medications through Medicare part D since she gets from [facility] and that she is require to inform her insurance.Notified Clinical pharmacist.   Per Clinical pharmacist, she should do as the letter says.  I Inform the patient I will reach out to [employer] to check the status of her shipment.  I reach out to [employer] to check the shipment of Trulicity.  Per [employer], Patient was approved on 10/19/2022 with a end date of 10/19/2023, and it can take 7-10 business days before patient will receive her shipment.Patient should receive her shipment next Wednesday.Notified Clinical pharmacist, and Patient.    Patient verbalized understanding, and ask if the clinical pharmacist was able to send in proof of income to [employer] for them to process her PAP for [place].Notified Clinical pharmacist.  Per Clinical pharmacist, I faxed it in on 09/30/2022.  I reach out to [employer] to check on the status of patient application regarding [place].  Per [employer], Patient was approved with start date of 10/06/2022 and end date of 10/19/2023.Patient first shipment was shipped on 10/26/2022 for 90 day supply, and could take 7 to 10 business to arrive. Patient should receive her shipment next week.   Patient verbalized understanding.  [place] Pharmacist Assistant [phone]

## 2022-10-27 NOTE — Telephone Encounter (Addendum)
Pt calling to speak with Cristie Hem about her Trulicity and a form she received that she has questions about / she asked if she is suppose to include Dapagliflozin Propanediol '10mg'$  tabs on the form she received/ please advise asap   The form has to be sent to her insurance so they know she is getting medications like (Trulicity) form [employer] and pt is not sure if she should include  Dapagliflozin on the form and if that will also come from [place]   Pt has 1 Trulicity shot left for Thursday and Hasn't received refill from [facility] / please advise

## 2022-11-15 IMAGING — MR MR LUMBAR SPINE W/O CM
5 series · 30 of 48 positions shown · non-contrast
Comparison: Lumbar spine radiographs 12/21/2021.  MRI 07/14/2021.

CLINICAL DATA: Chronic low back pain since childhood. Increasing
left-sided back pain over the last 3 days.

EXAM:
MRI LUMBAR SPINE WITHOUT CONTRAST
TECHNIQUE: Multiplanar, multisequence MR imaging of the lumbar spine was
performed. No intravenous contrast was administered.

[Series 5: T2 · sagittal · 4.0mm · 0.81mm/px · 6 of 17 slices shown (1 of 2)]
[im 1/17]
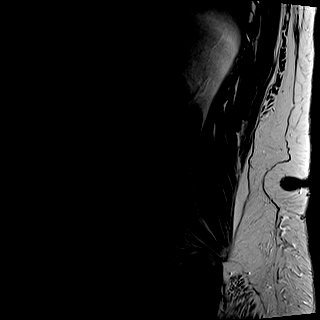
[im 4/17]
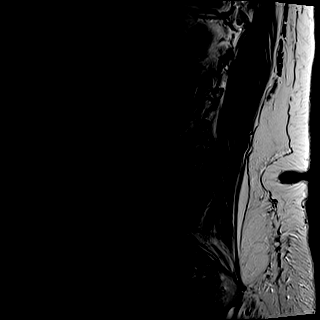
[im 7/17]
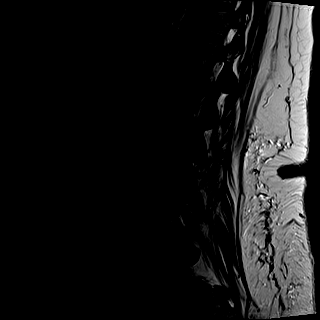
[im 10/17]
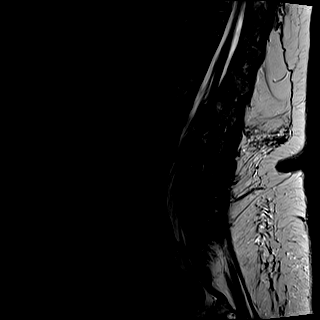
[im 13/17]
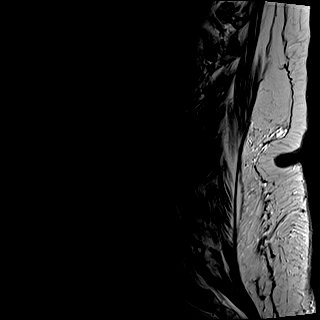
[im 17/17]
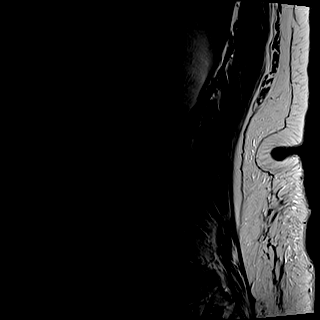

[Series 6: T1 · sagittal · 4.0mm · 0.81mm/px · 7 of 17 slices shown (1 of 2)]
[im 1/17]
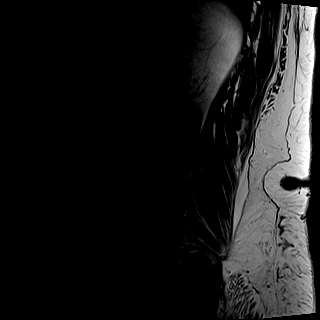
[im 3/17]
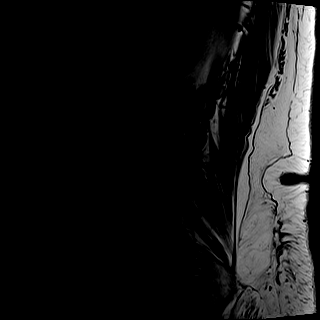
[im 6/17]
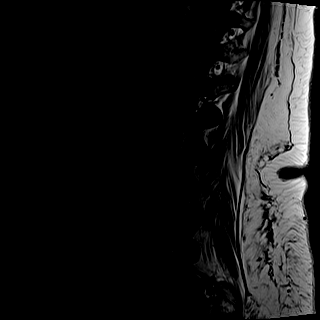
[im 9/17]
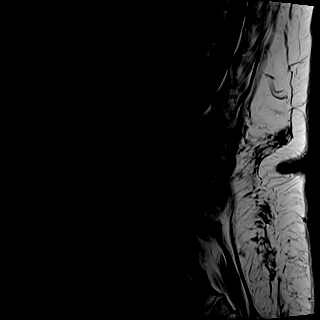
[im 11/17]
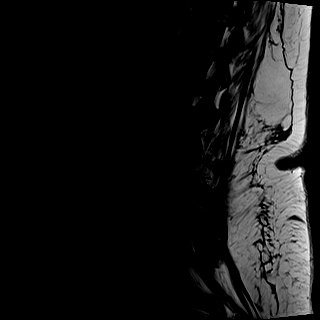
[im 14/17]
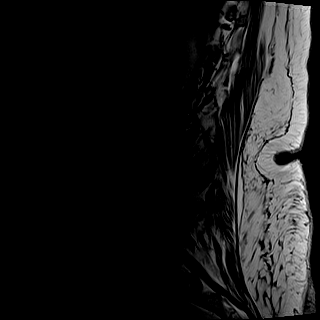
[im 17/17]
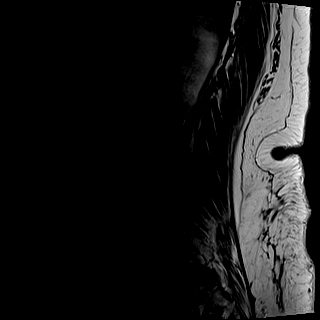

[Series 7: STIR · sagittal · 4.0mm · 0.41mm/px · 1 of 17 slices shown]
[im 1/17]
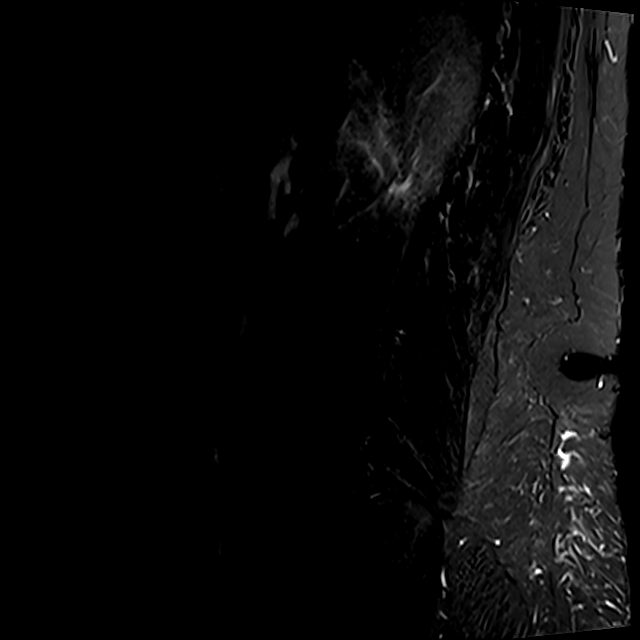

[Series 8: T2 · axial · 4.0mm · 0.78mm/px · z∈[-119,+78]mm · 8 of 35 slices shown (2 of 2)]
[im 1/35]
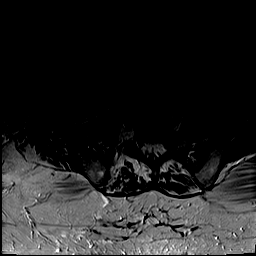
[im 6/35]
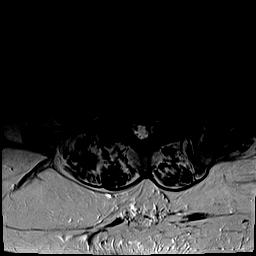
[im 11/35]
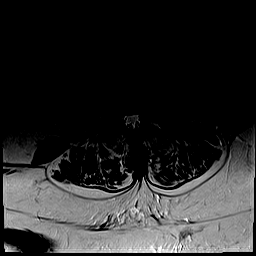
[im 16/35]
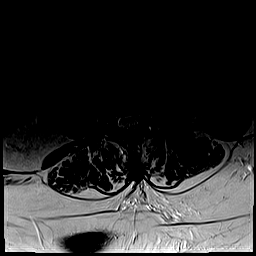
[im 19/35]
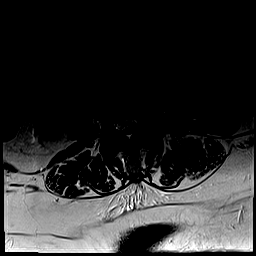
[im 24/35]
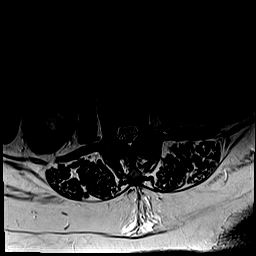
[im 29/35]
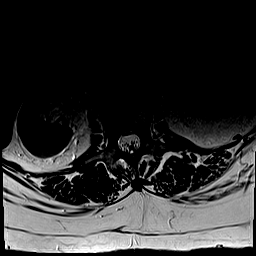
[im 35/35]
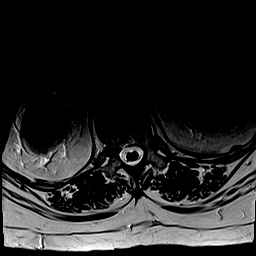

[Series 9: T1 · axial · 4.0mm · 0.39mm/px · z∈[-119,+78]mm · 8 of 35 slices shown (2 of 2)]
[im 1/35]
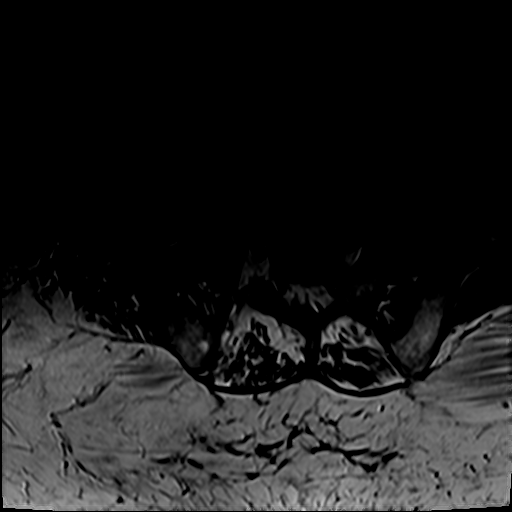
[im 6/35]
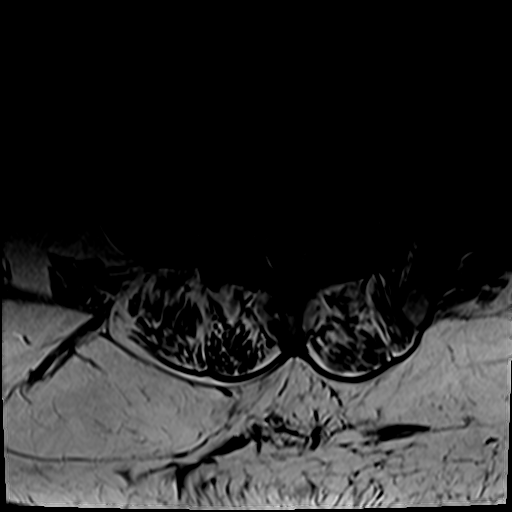
[im 11/35]
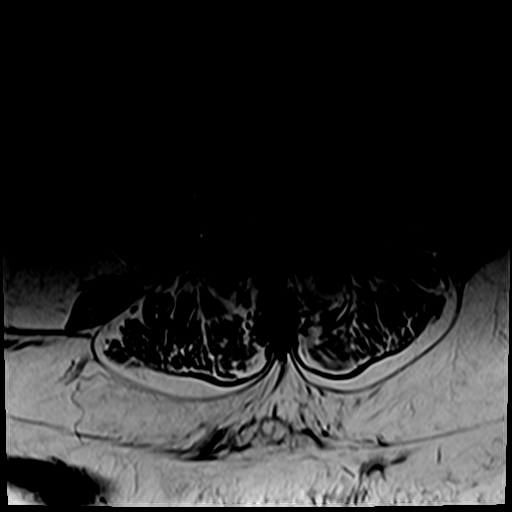
[im 16/35]
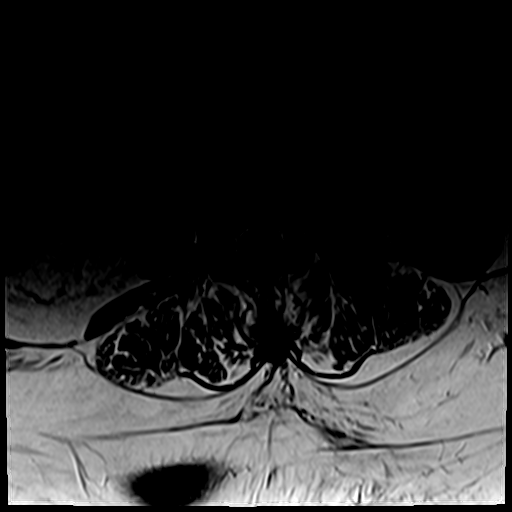
[im 19/35]
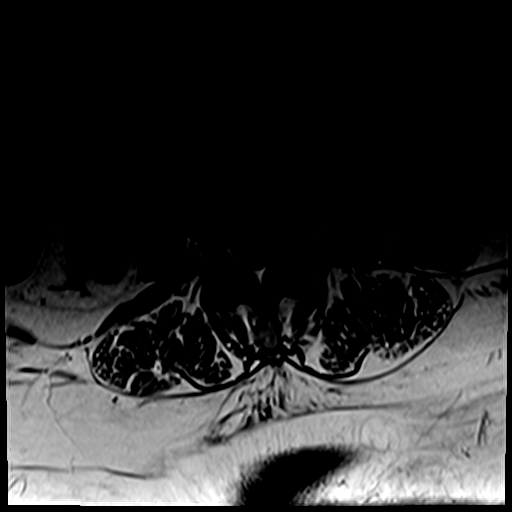
[im 24/35]
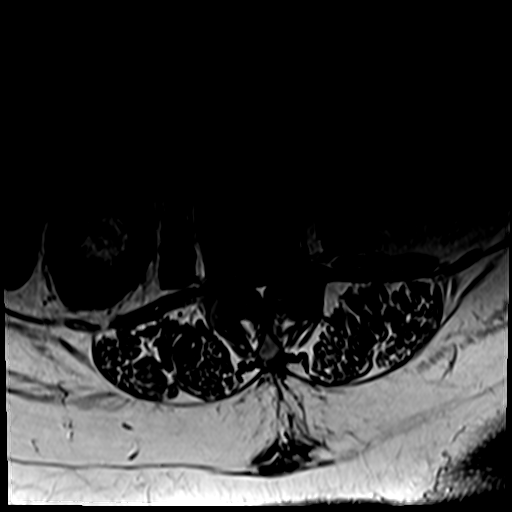
[im 29/35]
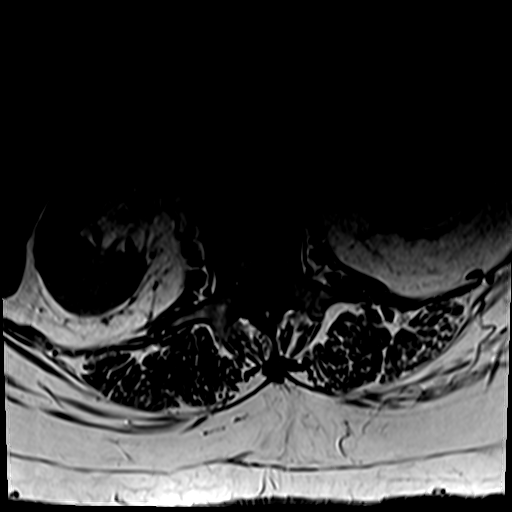
[im 35/35]
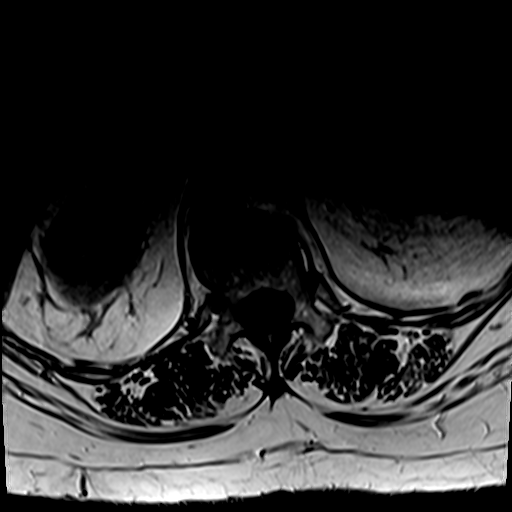

[30 of 48 positions shown; findings below may reference images not displayed]

FINDINGS: Segmentation: Conventional anatomy assumed, with the last open disc
space designated L5-S1.Concordant with previous imaging.

Alignment: Stable grade 1 degenerative anterolisthesis at L4-5 and
mild convex right scoliosis.

Vertebrae: No worrisome osseous lesion, acute fracture or pars
defect. There are hemangiomas within the L1 and L2 vertebral bodies.
The visualized sacroiliac joints appear unremarkable.

Conus medullaris: Extends to the L1 level and appears normal.

Paraspinal and other soft tissues: No significant paraspinal
findings. There is a partially imaged cystic lesion in the left
adnexa which measures up to 2.3 cm on sagittal image [DATE]. Although
incompletely visualized currently, this is unchanged from the most
recent study.

Disc levels:

L1-2: Preserved disc height and hydration. Anterior osteophytes
without spinal stenosis or nerve root encroachment.

L2-3: Mild disc bulging and loss of disc height with anterior
osteophytes. Mild facet and ligamentous hypertrophy. No spinal
stenosis or nerve root encroachment.

L3-4: Mild disc bulging, facet and ligamentous hypertrophy. No
significant spinal stenosis or nerve root encroachment.

L4-5: Advanced facet and ligamentous hypertrophy accounting for the
grade 1 anterolisthesis and contributing to minimal lateral recess
and foraminal narrowing bilaterally. No definite nerve root
encroachment or significant canal stenosis. Stable mild disc
bulging.

L5-S1: Stable chronic degenerative disc disease with annular disc
bulging and endplate osteophytes asymmetric to the right. Mild
bilateral facet hypertrophy. Stable mild to moderate right and
minimal left foraminal narrowing.
IMPRESSION: 1. No acute findings or explanation for the patient's symptoms.
Stable multilevel spondylosis as detailed above, without significant
progression or definite nerve root encroachment.
2. Advanced facet hypertrophy at L4-5 with stable grade 1
anterolisthesis.
3. Partially imaged and grossly stable simple appearing left adnexal
cyst. No follow-up imaging recommended. Note: This recommendation
does not apply to premenarchal patients and to those with increased
risk (genetic, family history, elevated tumor markers or other
high-risk factors) of ovarian cancer. Reference: JACR [DATE];

## 2022-11-16 DIAGNOSIS — J449 Chronic obstructive pulmonary disease, unspecified: Secondary | ICD-10-CM | POA: Diagnosis not present

## 2022-11-19 DIAGNOSIS — K080 Exfoliation of teeth due to systemic causes: Secondary | ICD-10-CM | POA: Diagnosis not present

## 2022-11-21 ENCOUNTER — Encounter: Payer: Self-pay | Admitting: Oncology

## 2022-11-30 DIAGNOSIS — M48062 Spinal stenosis, lumbar region with neurogenic claudication: Secondary | ICD-10-CM | POA: Diagnosis not present

## 2022-11-30 DIAGNOSIS — M25511 Pain in right shoulder: Secondary | ICD-10-CM | POA: Diagnosis not present

## 2022-11-30 DIAGNOSIS — M5136 Other intervertebral disc degeneration, lumbar region: Secondary | ICD-10-CM | POA: Diagnosis not present

## 2022-11-30 DIAGNOSIS — M5416 Radiculopathy, lumbar region: Secondary | ICD-10-CM | POA: Diagnosis not present

## 2022-12-09 DIAGNOSIS — I2510 Atherosclerotic heart disease of native coronary artery without angina pectoris: Secondary | ICD-10-CM | POA: Diagnosis not present

## 2022-12-09 DIAGNOSIS — M170 Bilateral primary osteoarthritis of knee: Secondary | ICD-10-CM | POA: Diagnosis not present

## 2022-12-09 DIAGNOSIS — M48062 Spinal stenosis, lumbar region with neurogenic claudication: Secondary | ICD-10-CM | POA: Diagnosis not present

## 2022-12-09 DIAGNOSIS — G4700 Insomnia, unspecified: Secondary | ICD-10-CM | POA: Diagnosis not present

## 2022-12-09 DIAGNOSIS — M5136 Other intervertebral disc degeneration, lumbar region: Secondary | ICD-10-CM | POA: Diagnosis not present

## 2022-12-09 DIAGNOSIS — Z87891 Personal history of nicotine dependence: Secondary | ICD-10-CM | POA: Diagnosis not present

## 2022-12-09 DIAGNOSIS — Z7985 Long-term (current) use of injectable non-insulin antidiabetic drugs: Secondary | ICD-10-CM | POA: Diagnosis not present

## 2022-12-09 DIAGNOSIS — Z9089 Acquired absence of other organs: Secondary | ICD-10-CM | POA: Diagnosis not present

## 2022-12-09 DIAGNOSIS — E785 Hyperlipidemia, unspecified: Secondary | ICD-10-CM | POA: Diagnosis not present

## 2022-12-09 DIAGNOSIS — N182 Chronic kidney disease, stage 2 (mild): Secondary | ICD-10-CM | POA: Diagnosis not present

## 2022-12-09 DIAGNOSIS — M7062 Trochanteric bursitis, left hip: Secondary | ICD-10-CM | POA: Diagnosis not present

## 2022-12-09 DIAGNOSIS — M25511 Pain in right shoulder: Secondary | ICD-10-CM | POA: Diagnosis not present

## 2022-12-09 DIAGNOSIS — G8929 Other chronic pain: Secondary | ICD-10-CM | POA: Diagnosis not present

## 2022-12-09 DIAGNOSIS — M1A30X0 Chronic gout due to renal impairment, unspecified site, without tophus (tophi): Secondary | ICD-10-CM | POA: Diagnosis not present

## 2022-12-09 DIAGNOSIS — Z7984 Long term (current) use of oral hypoglycemic drugs: Secondary | ICD-10-CM | POA: Diagnosis not present

## 2022-12-09 DIAGNOSIS — M5416 Radiculopathy, lumbar region: Secondary | ICD-10-CM | POA: Diagnosis not present

## 2022-12-09 DIAGNOSIS — Z9049 Acquired absence of other specified parts of digestive tract: Secondary | ICD-10-CM | POA: Diagnosis not present

## 2022-12-09 DIAGNOSIS — M1990 Unspecified osteoarthritis, unspecified site: Secondary | ICD-10-CM | POA: Diagnosis not present

## 2022-12-09 DIAGNOSIS — I129 Hypertensive chronic kidney disease with stage 1 through stage 4 chronic kidney disease, or unspecified chronic kidney disease: Secondary | ICD-10-CM | POA: Diagnosis not present

## 2022-12-09 DIAGNOSIS — Z5982 Transportation insecurity: Secondary | ICD-10-CM | POA: Diagnosis not present

## 2022-12-09 DIAGNOSIS — Z7983 Long term (current) use of bisphosphonates: Secondary | ICD-10-CM | POA: Diagnosis not present

## 2022-12-09 DIAGNOSIS — Z9181 History of falling: Secondary | ICD-10-CM | POA: Diagnosis not present

## 2022-12-09 DIAGNOSIS — E1122 Type 2 diabetes mellitus with diabetic chronic kidney disease: Secondary | ICD-10-CM | POA: Diagnosis not present

## 2022-12-17 DIAGNOSIS — J449 Chronic obstructive pulmonary disease, unspecified: Secondary | ICD-10-CM | POA: Diagnosis not present

## 2022-12-18 DIAGNOSIS — M1A30X0 Chronic gout due to renal impairment, unspecified site, without tophus (tophi): Secondary | ICD-10-CM | POA: Diagnosis not present

## 2022-12-18 DIAGNOSIS — M48062 Spinal stenosis, lumbar region with neurogenic claudication: Secondary | ICD-10-CM | POA: Diagnosis not present

## 2022-12-18 DIAGNOSIS — G4700 Insomnia, unspecified: Secondary | ICD-10-CM | POA: Diagnosis not present

## 2022-12-18 DIAGNOSIS — Z7984 Long term (current) use of oral hypoglycemic drugs: Secondary | ICD-10-CM | POA: Diagnosis not present

## 2022-12-18 DIAGNOSIS — G8929 Other chronic pain: Secondary | ICD-10-CM | POA: Diagnosis not present

## 2022-12-18 DIAGNOSIS — I2510 Atherosclerotic heart disease of native coronary artery without angina pectoris: Secondary | ICD-10-CM | POA: Diagnosis not present

## 2022-12-18 DIAGNOSIS — E785 Hyperlipidemia, unspecified: Secondary | ICD-10-CM | POA: Diagnosis not present

## 2022-12-18 DIAGNOSIS — E1122 Type 2 diabetes mellitus with diabetic chronic kidney disease: Secondary | ICD-10-CM | POA: Diagnosis not present

## 2022-12-18 DIAGNOSIS — M7062 Trochanteric bursitis, left hip: Secondary | ICD-10-CM | POA: Diagnosis not present

## 2022-12-18 DIAGNOSIS — M5136 Other intervertebral disc degeneration, lumbar region: Secondary | ICD-10-CM | POA: Diagnosis not present

## 2022-12-18 DIAGNOSIS — M25511 Pain in right shoulder: Secondary | ICD-10-CM | POA: Diagnosis not present

## 2022-12-18 DIAGNOSIS — M5416 Radiculopathy, lumbar region: Secondary | ICD-10-CM | POA: Diagnosis not present

## 2022-12-18 DIAGNOSIS — M1990 Unspecified osteoarthritis, unspecified site: Secondary | ICD-10-CM | POA: Diagnosis not present

## 2022-12-18 DIAGNOSIS — I129 Hypertensive chronic kidney disease with stage 1 through stage 4 chronic kidney disease, or unspecified chronic kidney disease: Secondary | ICD-10-CM | POA: Diagnosis not present

## 2022-12-18 DIAGNOSIS — N182 Chronic kidney disease, stage 2 (mild): Secondary | ICD-10-CM | POA: Diagnosis not present

## 2022-12-18 DIAGNOSIS — M170 Bilateral primary osteoarthritis of knee: Secondary | ICD-10-CM | POA: Diagnosis not present

## 2022-12-18 NOTE — Progress Notes (Unsigned)
Name: Amber Avery   MRN: [id number]    DOB: June 09, 1946   Date:12/21/2022       Progress Note  Subjective  Chief Complaint  Follow Up  HPI  HTN:   She is now on benazepril 5 mg. No chest pain, palpitation, dizziness  or change in exercise tolerance. BP is controlled   Dyslipidemia: taking statin therapy, no side effects, no chest pain or myalgia. Last LDL was back to normal at 59    Asthma Mild intermittent: she states she has been doing well, no wheezing, cough , mild SOB with activity but likely multifactorial.Stable   DMII: She is on Farxiga and Trulicity 1.5 mg through assistance program , A1C is up today at 8.8 %  no polyphagia, polyuria (stable because of diuretic) denies  polydipsia . She has dyslipidemia and last LDL was at goal., she has   CKI stage III she is on low dose ACE but last urine micro elevated but is on SGL-2 agonist since , takes gabapentin for neuropathy   Morbid obesity: BMI over 40 , weight is down from her last visit, she lost some teeth and is going to have partials places     OSA: she wear machine every night, she uses oxygen with CPAP and is compliant.     OA : she saw Ortho , Dr. Candelaria Stagers, and he advised her not to have surgery because of her weight, she had one round of hyaluronic injection without help and it was too expensive . She is still taking Tylenol She still has intermittent right knee effusion when more active, no redness or increase in warmth. She has a hoveround, uses walker and sometimes cane. Lately she is having worsening pain of left outer hip - she states she did some home PT two days ago and not sure if it flared up.     Iron deficiency anemia: last level was better, under the care of Dr. Tasia Catchings and had 5 iron infusions in the past but CBC has been back to normal, last ferritin was normal at 52 done 08/2022, last visit with GI was with Dr. Allen Norris - GI in 2020 .   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 Reassurance given    DDD lumbar spine and radiculitis: seen by Psyatrist , seeing Whitney FNP, she has MRI lumbar spine, steroid injection for trochanteric bursitis did not work, she also had PT . She is still taking Gabapentin and takes Tramadol every night but only prn during the day when the pain is intense She states pain is worse in the mornings , currently no back pain  B12 deficiency: taking supplements , last level at goal   Controlled gout: taking allopurinol , uric acid has been at goal, no recent episodes   Patient Active Problem List   Diagnosis Date Noted   Greater trochanteric bursitis, left 06/18/2022   Gait instability 12/13/2020   Stage 3a chronic kidney disease ([place]) 12/13/2020   Cellulitis of right ankle 04/12/2020   Morbid obesity with BMI of 45.0-49.9, adult ([place]) 02/06/2020   History of gastritis 08/14/2019   Angiodysplasia of stomach and duodenum    Iron deficiency anemia 04/20/2018   Anemia, unspecified 0000000   Lichen sclerosus of female genitalia 06/29/2016   Primary osteoarthritis of both knees 04/07/2016   Special screening for  malignant neoplasms, colon    Right thyroid nodule 06/26/2015   Asthma, intermittent 04/03/2015   Benign essential HTN 04/03/2015   Carpal tunnel syndrome 04/03/2015   Chronic kidney disease (CKD), stage II (mild) 04/03/2015   Controlled gout 04/03/2015   Arteriosclerosis of coronary artery 04/03/2015   Diabetes mellitus with renal manifestations, controlled ([place]) 04/03/2015   Dyslipidemia 04/03/2015   Edema extremities 04/03/2015   Elevated sedimentation rate 04/03/2015   Knee pain 04/03/2015   Lumbar radiculitis 04/03/2015   Obstructive apnea 04/03/2015   Lumbosacral spondylosis 04/03/2015   Osteoarthritis, chronic 04/03/2015   Psoriasis 04/03/2015   Vitamin D deficiency 04/03/2015   Varicose veins 04/03/2015    Goiter 12/23/2014   Degeneration of intervertebral disc of lumbar region 08/23/2014   Corns and callosity 06/11/2009    Past Surgical History:  Procedure Laterality Date   APPENDECTOMY     CATARACT EXTRACTION W/PHACO Left 08/14/2020   Procedure: CATARACT EXTRACTION PHACO AND INTRAOCULAR LENS PLACEMENT (I[facility]) LEFT DIABETIC 3.56 00:59.6 6.0%;  Surgeon: Leandrew Koyanagi, MD;  Location: [facility];  Service: Ophthalmology;  Laterality: Left;  Diabetic   CATARACT EXTRACTION W/PHACO Right 09/04/2020   Procedure: CATARACT EXTRACTION PHACO AND INTRAOCULAR LENS PLACEMENT ([place]) RIGHT DIABETIC;  Surgeon: Leandrew Koyanagi, MD;  Location: [facility];  Service: Ophthalmology;  Laterality: Right;  4.72 1:03.2 7.4%   CHOLECYSTECTOMY  1977   COLONOSCOPY WITH PROPOFOL N/A 01/21/2016   Procedure: COLONOSCOPY WITH PROPOFOL;  Surgeon: Lucilla Lame, MD;  Location: [facility];  Service: Endoscopy;  Laterality: N/A;   DILATION AND CURETTAGE OF UTERUS     Due to Amenorrhea   ESOPHAGOGASTRODUODENOSCOPY (EGD) WITH PROPOFOL N/A 08/16/2018   Procedure: ESOPHAGOGASTRODUODENOSCOPY (EGD) WITH PROPOFOL;  Surgeon: Lucilla Lame, MD;  Location: [facility];  Service: Endoscopy;  Laterality: N/A;   [place]     HERNIA REPAIR  2011   Temporal Area Excision Biopsy     For Birth Mark Changes, Negative Pathology   TONSILLECTOMY AND ADENOIDECTOMY      Family History  Problem Relation Age of Onset   Cancer Mother        brain tumor   Kidney disease Mother    Hypertension Mother    Heart disease Father    COPD Father    Hypertension Sister    Arthritis Sister    Cancer Sister    Heart murmur Sister    GER disease Sister    Lupus Sister    Diabetes Sister    Arthritis Sister    Osteopenia Sister    Heart disease Sister    Hypertension Sister    Breast cancer Maternal Aunt 68   Leukemia Maternal Aunt     Social History   Tobacco Use   Smoking  status: Former    Packs/day: 2.00    Years: 15.00    Total pack years: 30.00    Types: Cigarettes    Quit date: 1983    Years since quitting: 41.2   Smokeless tobacco: Never   Tobacco comments:    smoking cessation materials not required  Substance Use Topics   Alcohol use: Not Currently    Alcohol/week: 0.0 standard drinks of alcohol     Current Outpatient Medications:    acetaminophen (TYLENOL) 500 MG tablet, Take 1 tablet (500 mg total) by mouth every 6 (six) hours as needed. (Patient taking differently: Take 1,000 mg by mouth every 8 (eight) hours.), Disp: 480 tablet, Rfl: 0   albuterol (  VENTOLIN HFA) 108 (90 Base) MCG/ACT inhaler, Inhale 2 puffs into the lungs every 6 (six) hours as needed for wheezing or shortness of breath., Disp: 8 g, Rfl: 0   allopurinol (ZYLOPRIM) 300 MG tablet, TAKE 1 TABLET BY MOUTH DAILY, Disp: 90 tablet, Rfl: 3   atorvastatin (LIPITOR) 20 MG tablet, TAKE 1 TABLET BY MOUTH DAILY FOR CHOLESTEROL, Disp: 90 tablet, Rfl: 0   benazepril (LOTENSIN) 5 MG tablet, TAKE 1 TABLET BY MOUTH DAILY, Disp: 90 tablet, Rfl: 3   Blood Pressure KIT, 1 each by Does not apply route daily., Disp: 1 kit, Rfl: 0   dapagliflozin propanediol (FARXIGA) 10 MG TABS tablet, Take 1 tablet (10 mg total) by mouth daily before breakfast., Disp: 90 tablet, Rfl: 3   diclofenac Sodium (VOLTAREN) 1 % GEL, APPLY 4 GRAMS TOPICALLY FOUR TIMES DAILY AS DIRECTED GENERIC EQUIVALENT FOR VOL.TAREN, Disp: 300 g, Rfl: 1   Dulaglutide (TRULICITY) 1.5 0000000 SOPN, Inject 1.5 mg into the skin once a week., Disp: 6 mL, Rfl: 3   furosemide (LASIX) 40 MG tablet, TAKE 1 TABLET BY MOUTH DAILY, Disp: 90 tablet, Rfl: 3   gabapentin (NEURONTIN) 300 MG capsule, TAKE 1 CAPSULE BY MOUTH IN THE MORNING, 1 CAPSULE IN THE EVENING AND 2 CAPSULES AT NIGHT, Disp: 360 capsule, Rfl: 3   glucose blood (ONETOUCH VERIO) test strip, USE AS DIRECTED, Disp: 50 each, Rfl: 1   Lancets (ONETOUCH ULTRASOFT) lancets, Use as instructed,  Disp: 100 each, Rfl: 12   methocarbamol (ROBAXIN) 500 MG tablet, Take 1 tablet (500 mg total) by mouth every 6 (six) hours as needed., Disp: 15 tablet, Rfl: 0   nystatin cream (MYCOSTATIN), Apply 1 Application topically 2 (two) times daily., Disp: 60 g, Rfl: 3   oxybutynin (DITROPAN XL) 15 MG 24 hr tablet, Take 1 tablet (15 mg total) by mouth daily., Disp: 90 tablet, Rfl: 3   potassium chloride SA (KLOR-CON M) 20 MEQ tablet, TAKE 1 TABLET BY MOUTH DAILY, Disp: 90 tablet, Rfl: 3   traMADol (ULTRAM) 50 MG tablet, Take 25-50 mg by mouth 2 (two) times daily as needed., Disp: , Rfl:    triamcinolone ointment (KENALOG) 0.1 %, APPLY TOPICALLY TWICE DAILY AS NEEDED. GENERIC EQUIVALENT FOR KENALOG., Disp: 90 g, Rfl: 0   vitamin B-12 (CYANOCOBALAMIN) 500 MCG tablet, Take 500 mcg by mouth daily., Disp: , Rfl:    Vitamin D, Cholecalciferol, 25 MCG (1000 UT) TABS, Take 1 capsule by mouth daily., Disp: 90 tablet, Rfl: 1  Allergies  Allergen Reactions   Codeine Hives and Nausea And Vomiting    I personally reviewed active problem list, medication list, allergies, family history, social history, health maintenance with the patient/caregiver today.   ROS  Constitutional: Negative for fever , positive for mild weight change.  Respiratory: Negative for cough and shortness of breath.   Cardiovascular: Negative for chest pain or palpitations.  Gastrointestinal: Negative for abdominal pain, no bowel changes.  Musculoskeletal:positive  for gait problem and intermittent joint swelling.  Skin: Negative for rash.  Neurological: Negative for dizziness or headache.  No other specific complaints in a complete review of systems (except as listed in HPI above).   Objective  Vitals:   12/21/22 1415  BP: 122/70  Pulse: 92  Resp: 16  Temp: 97.8 F (36.6 C)  TempSrc: Oral  SpO2: 94%  Weight: 263 lb 6.4 oz (119.5 kg)  Height: '5\' 1"'$  (1.549 m)    Body mass index is 49.77 kg/m.  Physical  Exam  Constitutional: Patient appears well-developed and well-nourished. Obese  No distress.  HEENT: head atraumatic, normocephalic, pupils equal and reactive to light, neck supple Cardiovascular: Normal rate, regular rhythm and normal heart sounds.  No murmur heard. Trace  BLE edema. Pulmonary/Chest: Effort normal and breath sounds normal. No respiratory distress. Abdominal: Soft.  There is no tenderness. Psychiatric: Patient has a normal mood and affect. behavior is normal. Judgment and thought content normal.   Recent Results (from the past 2160 hour(s))  HM DIABETES EYE EXAM     Status: None   Collection Time: 10/06/22 12:00 AM  Result Value Ref Range   HM Diabetic Eye Exam No Retinopathy No Retinopathy  POCT HgB A1C     Status: Abnormal   Collection Time: 12/21/22  2:17 PM  Result Value Ref Range   Hemoglobin A1C 8.8 (A) 4.0 - 5.6 %   HbA1c POC (<> result, manual entry)     HbA1c, POC (prediabetic range)     HbA1c, POC (controlled diabetic range)      PHQ2/9:    12/21/2022    2:15 PM 08/19/2022    1:20 PM 07/07/2022    1:04 PM 06/25/2022    9:53 AM 06/18/2022    7:44 AM  Depression screen PHQ 2/9  Decreased Interest 0 0 0 0 0  Down, Depressed, Hopeless 0 0 0 0 0  PHQ - 2 Score 0 0 0 0 0  Altered sleeping 0 '1 2 1 1  '$ Tired, decreased energy 0 1 1 0 0  Change in appetite 0 0 0 1 1  Feeling bad or failure about yourself  0 0 0 0 0  Trouble concentrating 0 0 0 0 0  Moving slowly or fidgety/restless 0 0 0 0 0  Suicidal thoughts 0 0 0 0 0  PHQ-9 Score 0 '2 3 2 2    '$ phq 9 is negative   Fall Risk:    12/21/2022    2:06 PM 08/19/2022    1:19 PM 08/12/2022    2:42 PM 07/07/2022    1:04 PM 06/25/2022    9:53 AM  Fall Risk   Falls in the past year? 0 0 0 0 0  Number falls in past yr:   0 0   Injury with Fall?   0 0   Risk for fall due to : Impaired balance/gait No Fall Risks Impaired mobility No Fall Risks   Follow up Falls prevention discussed;Education provided;Falls  evaluation completed Falls prevention discussed;Education provided;Falls evaluation completed Falls evaluation completed Falls prevention discussed Education provided;Falls prevention discussed;Falls evaluation completed      Functional Status Survey: Is the patient deaf or have difficulty hearing?: No Does the patient have difficulty seeing, even when wearing glasses/contacts?: Yes Does the patient have difficulty concentrating, remembering, or making decisions?: No Does the patient have difficulty walking or climbing stairs?: Yes Does the patient have difficulty dressing or bathing?: Yes Does the patient have difficulty doing errands alone such as visiting a doctor's office or shopping?: No    Assessment & Plan  1. Controlled type 2 diabetes mellitus with stage 3 chronic kidney disease, without long-term current use of insulin (HCC)  - POCT HgB A1C  2. Stage 3a chronic kidney disease (HCC)  Recheck levels  3. Morbid obesity with BMI of 45.0-49.9, adult [facility])  Discussed with the patient the risk posed by an increased BMI. Discussed importance of portion control, calorie counting and at least 150 minutes of physical activity weekly. Avoid sweet beverages  and drink more water. Eat at least 6 servings of fruit and vegetables daily    4. Hypertension associated with type 2 diabetes mellitus (HCC)  BP is at goal   5. Senile purpura ([place])  Reassurance given   6. OSA on CPAP  Continue CPAP  7. B12 deficiency  Continue supplementation  8. Asthma, mild intermittent, well-controlled   9. Controlled gout   10. Gait instability  Using walker today   11. Vitamin D deficiency   12. Greater trochanteric bursitis, left   13. Osteoarthritis, chronic  Stable on medication, getting PT   14. Dyslipidemia

## 2022-12-21 ENCOUNTER — Ambulatory Visit (INDEPENDENT_AMBULATORY_CARE_PROVIDER_SITE_OTHER): Payer: Medicare Other | Admitting: Family Medicine

## 2022-12-21 ENCOUNTER — Encounter: Payer: Self-pay | Admitting: Family Medicine

## 2022-12-21 VITALS — BP 122/70 | HR 92 | Temp 97.8°F | Resp 16 | Ht 61.0 in | Wt 263.4 lb

## 2022-12-21 DIAGNOSIS — E559 Vitamin D deficiency, unspecified: Secondary | ICD-10-CM

## 2022-12-21 DIAGNOSIS — R2681 Unsteadiness on feet: Secondary | ICD-10-CM

## 2022-12-21 DIAGNOSIS — E1159 Type 2 diabetes mellitus with other circulatory complications: Secondary | ICD-10-CM | POA: Diagnosis not present

## 2022-12-21 DIAGNOSIS — E538 Deficiency of other specified B group vitamins: Secondary | ICD-10-CM

## 2022-12-21 DIAGNOSIS — J4520 Mild intermittent asthma, uncomplicated: Secondary | ICD-10-CM | POA: Diagnosis not present

## 2022-12-21 DIAGNOSIS — M1990 Unspecified osteoarthritis, unspecified site: Secondary | ICD-10-CM

## 2022-12-21 DIAGNOSIS — N1831 Chronic kidney disease, stage 3a: Secondary | ICD-10-CM | POA: Diagnosis not present

## 2022-12-21 DIAGNOSIS — Z6841 Body Mass Index (BMI) 40.0 and over, adult: Secondary | ICD-10-CM

## 2022-12-21 DIAGNOSIS — D692 Other nonthrombocytopenic purpura: Secondary | ICD-10-CM | POA: Diagnosis not present

## 2022-12-21 DIAGNOSIS — M7062 Trochanteric bursitis, left hip: Secondary | ICD-10-CM | POA: Diagnosis not present

## 2022-12-21 DIAGNOSIS — G4733 Obstructive sleep apnea (adult) (pediatric): Secondary | ICD-10-CM | POA: Diagnosis not present

## 2022-12-21 DIAGNOSIS — N183 Chronic kidney disease, stage 3 unspecified: Secondary | ICD-10-CM | POA: Diagnosis not present

## 2022-12-21 DIAGNOSIS — M109 Gout, unspecified: Secondary | ICD-10-CM

## 2022-12-21 DIAGNOSIS — E1122 Type 2 diabetes mellitus with diabetic chronic kidney disease: Secondary | ICD-10-CM

## 2022-12-21 DIAGNOSIS — E785 Hyperlipidemia, unspecified: Secondary | ICD-10-CM

## 2022-12-21 DIAGNOSIS — I152 Hypertension secondary to endocrine disorders: Secondary | ICD-10-CM

## 2022-12-21 LAB — POCT GLYCOSYLATED HEMOGLOBIN (HGB A1C): Hemoglobin A1C: 8.8 % — AB (ref 4.0–5.6)

## 2022-12-23 DIAGNOSIS — I2510 Atherosclerotic heart disease of native coronary artery without angina pectoris: Secondary | ICD-10-CM | POA: Diagnosis not present

## 2022-12-23 DIAGNOSIS — M1990 Unspecified osteoarthritis, unspecified site: Secondary | ICD-10-CM | POA: Diagnosis not present

## 2022-12-23 DIAGNOSIS — I129 Hypertensive chronic kidney disease with stage 1 through stage 4 chronic kidney disease, or unspecified chronic kidney disease: Secondary | ICD-10-CM | POA: Diagnosis not present

## 2022-12-23 DIAGNOSIS — M170 Bilateral primary osteoarthritis of knee: Secondary | ICD-10-CM | POA: Diagnosis not present

## 2022-12-23 DIAGNOSIS — M5416 Radiculopathy, lumbar region: Secondary | ICD-10-CM | POA: Diagnosis not present

## 2022-12-23 DIAGNOSIS — E1122 Type 2 diabetes mellitus with diabetic chronic kidney disease: Secondary | ICD-10-CM | POA: Diagnosis not present

## 2022-12-23 DIAGNOSIS — Z7984 Long term (current) use of oral hypoglycemic drugs: Secondary | ICD-10-CM | POA: Diagnosis not present

## 2022-12-23 DIAGNOSIS — M48062 Spinal stenosis, lumbar region with neurogenic claudication: Secondary | ICD-10-CM | POA: Diagnosis not present

## 2022-12-23 DIAGNOSIS — G4700 Insomnia, unspecified: Secondary | ICD-10-CM | POA: Diagnosis not present

## 2022-12-23 DIAGNOSIS — G8929 Other chronic pain: Secondary | ICD-10-CM | POA: Diagnosis not present

## 2022-12-23 DIAGNOSIS — M1A30X0 Chronic gout due to renal impairment, unspecified site, without tophus (tophi): Secondary | ICD-10-CM | POA: Diagnosis not present

## 2022-12-23 DIAGNOSIS — N182 Chronic kidney disease, stage 2 (mild): Secondary | ICD-10-CM | POA: Diagnosis not present

## 2022-12-23 DIAGNOSIS — M25511 Pain in right shoulder: Secondary | ICD-10-CM | POA: Diagnosis not present

## 2022-12-23 DIAGNOSIS — M7062 Trochanteric bursitis, left hip: Secondary | ICD-10-CM | POA: Diagnosis not present

## 2022-12-23 DIAGNOSIS — E785 Hyperlipidemia, unspecified: Secondary | ICD-10-CM | POA: Diagnosis not present

## 2022-12-23 DIAGNOSIS — M5136 Other intervertebral disc degeneration, lumbar region: Secondary | ICD-10-CM | POA: Diagnosis not present

## 2022-12-30 DIAGNOSIS — E1122 Type 2 diabetes mellitus with diabetic chronic kidney disease: Secondary | ICD-10-CM | POA: Diagnosis not present

## 2022-12-30 DIAGNOSIS — M7062 Trochanteric bursitis, left hip: Secondary | ICD-10-CM | POA: Diagnosis not present

## 2022-12-30 DIAGNOSIS — G4700 Insomnia, unspecified: Secondary | ICD-10-CM | POA: Diagnosis not present

## 2022-12-30 DIAGNOSIS — M5136 Other intervertebral disc degeneration, lumbar region: Secondary | ICD-10-CM | POA: Diagnosis not present

## 2022-12-30 DIAGNOSIS — M48062 Spinal stenosis, lumbar region with neurogenic claudication: Secondary | ICD-10-CM | POA: Diagnosis not present

## 2022-12-30 DIAGNOSIS — I2510 Atherosclerotic heart disease of native coronary artery without angina pectoris: Secondary | ICD-10-CM | POA: Diagnosis not present

## 2022-12-30 DIAGNOSIS — E785 Hyperlipidemia, unspecified: Secondary | ICD-10-CM | POA: Diagnosis not present

## 2022-12-30 DIAGNOSIS — G8929 Other chronic pain: Secondary | ICD-10-CM | POA: Diagnosis not present

## 2022-12-30 DIAGNOSIS — M1990 Unspecified osteoarthritis, unspecified site: Secondary | ICD-10-CM | POA: Diagnosis not present

## 2022-12-30 DIAGNOSIS — M170 Bilateral primary osteoarthritis of knee: Secondary | ICD-10-CM | POA: Diagnosis not present

## 2022-12-30 DIAGNOSIS — N182 Chronic kidney disease, stage 2 (mild): Secondary | ICD-10-CM | POA: Diagnosis not present

## 2022-12-30 DIAGNOSIS — I129 Hypertensive chronic kidney disease with stage 1 through stage 4 chronic kidney disease, or unspecified chronic kidney disease: Secondary | ICD-10-CM | POA: Diagnosis not present

## 2022-12-30 DIAGNOSIS — M25511 Pain in right shoulder: Secondary | ICD-10-CM | POA: Diagnosis not present

## 2022-12-30 DIAGNOSIS — M1A30X0 Chronic gout due to renal impairment, unspecified site, without tophus (tophi): Secondary | ICD-10-CM | POA: Diagnosis not present

## 2022-12-30 DIAGNOSIS — Z7984 Long term (current) use of oral hypoglycemic drugs: Secondary | ICD-10-CM | POA: Diagnosis not present

## 2022-12-30 DIAGNOSIS — M5416 Radiculopathy, lumbar region: Secondary | ICD-10-CM | POA: Diagnosis not present

## 2023-01-04 ENCOUNTER — Other Ambulatory Visit: Payer: Self-pay | Admitting: Family Medicine

## 2023-01-04 DIAGNOSIS — M1990 Unspecified osteoarthritis, unspecified site: Secondary | ICD-10-CM

## 2023-01-05 DIAGNOSIS — G4700 Insomnia, unspecified: Secondary | ICD-10-CM | POA: Diagnosis not present

## 2023-01-05 DIAGNOSIS — M5136 Other intervertebral disc degeneration, lumbar region: Secondary | ICD-10-CM | POA: Diagnosis not present

## 2023-01-05 DIAGNOSIS — Z7984 Long term (current) use of oral hypoglycemic drugs: Secondary | ICD-10-CM | POA: Diagnosis not present

## 2023-01-05 DIAGNOSIS — I129 Hypertensive chronic kidney disease with stage 1 through stage 4 chronic kidney disease, or unspecified chronic kidney disease: Secondary | ICD-10-CM | POA: Diagnosis not present

## 2023-01-05 DIAGNOSIS — N182 Chronic kidney disease, stage 2 (mild): Secondary | ICD-10-CM | POA: Diagnosis not present

## 2023-01-05 DIAGNOSIS — M7062 Trochanteric bursitis, left hip: Secondary | ICD-10-CM | POA: Diagnosis not present

## 2023-01-05 DIAGNOSIS — M170 Bilateral primary osteoarthritis of knee: Secondary | ICD-10-CM | POA: Diagnosis not present

## 2023-01-05 DIAGNOSIS — M5416 Radiculopathy, lumbar region: Secondary | ICD-10-CM | POA: Diagnosis not present

## 2023-01-05 DIAGNOSIS — I2510 Atherosclerotic heart disease of native coronary artery without angina pectoris: Secondary | ICD-10-CM | POA: Diagnosis not present

## 2023-01-05 DIAGNOSIS — M48062 Spinal stenosis, lumbar region with neurogenic claudication: Secondary | ICD-10-CM | POA: Diagnosis not present

## 2023-01-05 DIAGNOSIS — M1A30X0 Chronic gout due to renal impairment, unspecified site, without tophus (tophi): Secondary | ICD-10-CM | POA: Diagnosis not present

## 2023-01-05 DIAGNOSIS — M1990 Unspecified osteoarthritis, unspecified site: Secondary | ICD-10-CM | POA: Diagnosis not present

## 2023-01-05 DIAGNOSIS — E785 Hyperlipidemia, unspecified: Secondary | ICD-10-CM | POA: Diagnosis not present

## 2023-01-05 DIAGNOSIS — G8929 Other chronic pain: Secondary | ICD-10-CM | POA: Diagnosis not present

## 2023-01-05 DIAGNOSIS — M25511 Pain in right shoulder: Secondary | ICD-10-CM | POA: Diagnosis not present

## 2023-01-05 DIAGNOSIS — E1122 Type 2 diabetes mellitus with diabetic chronic kidney disease: Secondary | ICD-10-CM | POA: Diagnosis not present

## 2023-01-13 DIAGNOSIS — N182 Chronic kidney disease, stage 2 (mild): Secondary | ICD-10-CM | POA: Diagnosis not present

## 2023-01-13 DIAGNOSIS — M5136 Other intervertebral disc degeneration, lumbar region: Secondary | ICD-10-CM | POA: Diagnosis not present

## 2023-01-13 DIAGNOSIS — Z7984 Long term (current) use of oral hypoglycemic drugs: Secondary | ICD-10-CM | POA: Diagnosis not present

## 2023-01-13 DIAGNOSIS — Z5982 Transportation insecurity: Secondary | ICD-10-CM | POA: Diagnosis not present

## 2023-01-13 DIAGNOSIS — M170 Bilateral primary osteoarthritis of knee: Secondary | ICD-10-CM | POA: Diagnosis not present

## 2023-01-13 DIAGNOSIS — M7062 Trochanteric bursitis, left hip: Secondary | ICD-10-CM | POA: Diagnosis not present

## 2023-01-13 DIAGNOSIS — M48062 Spinal stenosis, lumbar region with neurogenic claudication: Secondary | ICD-10-CM | POA: Diagnosis not present

## 2023-01-13 DIAGNOSIS — I129 Hypertensive chronic kidney disease with stage 1 through stage 4 chronic kidney disease, or unspecified chronic kidney disease: Secondary | ICD-10-CM | POA: Diagnosis not present

## 2023-01-13 DIAGNOSIS — Z7985 Long-term (current) use of injectable non-insulin antidiabetic drugs: Secondary | ICD-10-CM | POA: Diagnosis not present

## 2023-01-13 DIAGNOSIS — M5416 Radiculopathy, lumbar region: Secondary | ICD-10-CM | POA: Diagnosis not present

## 2023-01-13 DIAGNOSIS — Z9049 Acquired absence of other specified parts of digestive tract: Secondary | ICD-10-CM | POA: Diagnosis not present

## 2023-01-13 DIAGNOSIS — Z7983 Long term (current) use of bisphosphonates: Secondary | ICD-10-CM | POA: Diagnosis not present

## 2023-01-13 DIAGNOSIS — I2510 Atherosclerotic heart disease of native coronary artery without angina pectoris: Secondary | ICD-10-CM | POA: Diagnosis not present

## 2023-01-13 DIAGNOSIS — M25511 Pain in right shoulder: Secondary | ICD-10-CM | POA: Diagnosis not present

## 2023-01-13 DIAGNOSIS — M1990 Unspecified osteoarthritis, unspecified site: Secondary | ICD-10-CM | POA: Diagnosis not present

## 2023-01-13 DIAGNOSIS — Z9181 History of falling: Secondary | ICD-10-CM | POA: Diagnosis not present

## 2023-01-13 DIAGNOSIS — M1A30X0 Chronic gout due to renal impairment, unspecified site, without tophus (tophi): Secondary | ICD-10-CM | POA: Diagnosis not present

## 2023-01-13 DIAGNOSIS — G4700 Insomnia, unspecified: Secondary | ICD-10-CM | POA: Diagnosis not present

## 2023-01-13 DIAGNOSIS — Z9089 Acquired absence of other organs: Secondary | ICD-10-CM | POA: Diagnosis not present

## 2023-01-13 DIAGNOSIS — Z87891 Personal history of nicotine dependence: Secondary | ICD-10-CM | POA: Diagnosis not present

## 2023-01-13 DIAGNOSIS — G8929 Other chronic pain: Secondary | ICD-10-CM | POA: Diagnosis not present

## 2023-01-13 DIAGNOSIS — E785 Hyperlipidemia, unspecified: Secondary | ICD-10-CM | POA: Diagnosis not present

## 2023-01-13 DIAGNOSIS — E1122 Type 2 diabetes mellitus with diabetic chronic kidney disease: Secondary | ICD-10-CM | POA: Diagnosis not present

## 2023-01-15 DIAGNOSIS — J449 Chronic obstructive pulmonary disease, unspecified: Secondary | ICD-10-CM | POA: Diagnosis not present

## 2023-01-18 ENCOUNTER — Other Ambulatory Visit: Payer: Self-pay

## 2023-01-18 ENCOUNTER — Telehealth: Payer: Self-pay

## 2023-01-18 DIAGNOSIS — N1831 Chronic kidney disease, stage 3a: Secondary | ICD-10-CM

## 2023-01-18 DIAGNOSIS — E1122 Type 2 diabetes mellitus with diabetic chronic kidney disease: Secondary | ICD-10-CM

## 2023-01-18 MED ORDER — TRULICITY 1.5 MG/0.5ML ~~LOC~~ SOAJ: 3.0000 mg | SUBCUTANEOUS | 3 refills | Status: DC

## 2023-01-18 MED ORDER — TRULICITY 3 MG/0.5ML ~~LOC~~ SOAJ: 3.0000 mg | SUBCUTANEOUS | 3 refills | Status: DC

## 2023-01-18 NOTE — Telephone Encounter (Signed)
Received notice from [facility] that Trulicity 1.5mg  was temporarily unavailable due to supply chain shortage. Dr. Ancil Boozer notified, asked to see if patient was ok changing to 3mg  dosage and she agreed. New prescription sent.

## 2023-01-20 DIAGNOSIS — E1122 Type 2 diabetes mellitus with diabetic chronic kidney disease: Secondary | ICD-10-CM | POA: Diagnosis not present

## 2023-01-20 DIAGNOSIS — M25511 Pain in right shoulder: Secondary | ICD-10-CM | POA: Diagnosis not present

## 2023-01-20 DIAGNOSIS — I2510 Atherosclerotic heart disease of native coronary artery without angina pectoris: Secondary | ICD-10-CM | POA: Diagnosis not present

## 2023-01-20 DIAGNOSIS — G8929 Other chronic pain: Secondary | ICD-10-CM | POA: Diagnosis not present

## 2023-01-20 DIAGNOSIS — M170 Bilateral primary osteoarthritis of knee: Secondary | ICD-10-CM | POA: Diagnosis not present

## 2023-01-20 DIAGNOSIS — M1990 Unspecified osteoarthritis, unspecified site: Secondary | ICD-10-CM | POA: Diagnosis not present

## 2023-01-20 DIAGNOSIS — G4700 Insomnia, unspecified: Secondary | ICD-10-CM | POA: Diagnosis not present

## 2023-01-20 DIAGNOSIS — M7062 Trochanteric bursitis, left hip: Secondary | ICD-10-CM | POA: Diagnosis not present

## 2023-01-20 DIAGNOSIS — M5136 Other intervertebral disc degeneration, lumbar region: Secondary | ICD-10-CM | POA: Diagnosis not present

## 2023-01-20 DIAGNOSIS — Z7984 Long term (current) use of oral hypoglycemic drugs: Secondary | ICD-10-CM | POA: Diagnosis not present

## 2023-01-20 DIAGNOSIS — N182 Chronic kidney disease, stage 2 (mild): Secondary | ICD-10-CM | POA: Diagnosis not present

## 2023-01-20 DIAGNOSIS — I129 Hypertensive chronic kidney disease with stage 1 through stage 4 chronic kidney disease, or unspecified chronic kidney disease: Secondary | ICD-10-CM | POA: Diagnosis not present

## 2023-01-20 DIAGNOSIS — E785 Hyperlipidemia, unspecified: Secondary | ICD-10-CM | POA: Diagnosis not present

## 2023-01-20 DIAGNOSIS — M48062 Spinal stenosis, lumbar region with neurogenic claudication: Secondary | ICD-10-CM | POA: Diagnosis not present

## 2023-01-20 DIAGNOSIS — M5416 Radiculopathy, lumbar region: Secondary | ICD-10-CM | POA: Diagnosis not present

## 2023-01-20 DIAGNOSIS — M1A30X0 Chronic gout due to renal impairment, unspecified site, without tophus (tophi): Secondary | ICD-10-CM | POA: Diagnosis not present

## 2023-02-02 ENCOUNTER — Other Ambulatory Visit: Payer: Self-pay | Admitting: Family Medicine

## 2023-02-02 DIAGNOSIS — E785 Hyperlipidemia, unspecified: Secondary | ICD-10-CM

## 2023-02-15 DIAGNOSIS — J449 Chronic obstructive pulmonary disease, unspecified: Secondary | ICD-10-CM | POA: Diagnosis not present

## 2023-02-22 ENCOUNTER — Telehealth: Payer: Self-pay

## 2023-02-22 DIAGNOSIS — J4520 Mild intermittent asthma, uncomplicated: Secondary | ICD-10-CM

## 2023-02-22 NOTE — Progress Notes (Signed)
Care Management & Coordination Services Pharmacy Team Pharmacy Assistant   Name: Amber Avery  MRN: [id number] DOB: 17-Oct-1946  Reason for Encounter: Hypertension  Contacted patient to discuss hypertension disease state. Spoke with patient on 02/22/2023   Chart Updates: Recent office visits:  12/21/2022 Alba Cory, MD (PCP Office Visit) for Follow-up- No medication changes noted, Lab orders placed, No follow-up noted  Recent consult visits:  11/30/2022 Berneda Rose Chasnis, DO (Physical medicine) - I am unable to view the note  Hospital visits:  None in previous 6 months  Medications: Outpatient Encounter Medications as of 02/22/2023  Medication Sig Note   Dulaglutide (TRULICITY) 3 MG/0.5ML SOPN Inject 3 mg as directed once a week.    acetaminophen (TYLENOL) 500 MG tablet Take 1 tablet (500 mg total) by mouth every 6 (six) hours as needed. (Patient taking differently: Take 1,000 mg by mouth every 8 (eight) hours.)    albuterol (VENTOLIN HFA) 108 (90 Base) MCG/ACT inhaler Inhale 2 puffs into the lungs every 6 (six) hours as needed for wheezing or shortness of breath.    allopurinol (ZYLOPRIM) 300 MG tablet TAKE 1 TABLET BY MOUTH DAILY    atorvastatin (LIPITOR) 20 MG tablet TAKE 1 TABLET BY MOUTH DAILY FOR CHOLESTEROL    benazepril (LOTENSIN) 5 MG tablet TAKE 1 TABLET BY MOUTH DAILY    Blood Pressure KIT 1 each by Does not apply route daily.    dapagliflozin propanediol (FARXIGA) 10 MG TABS tablet Take 1 tablet (10 mg total) by mouth daily before breakfast.    diclofenac Sodium (VOLTAREN) 1 % GEL APPLY 4 GRAMS TOPICALLY FOUR TIMES DAILY AS DIRECTED; GENERIC EQUIVALENT FOR VOLTAREN    furosemide (LASIX) 40 MG tablet TAKE 1 TABLET BY MOUTH DAILY    gabapentin (NEURONTIN) 300 MG capsule TAKE 1 CAPSULE BY MOUTH IN THE MORNING, 1 CAPSULE IN THE EVENING AND 2 CAPSULES AT NIGHT    glucose blood (ONETOUCH VERIO) test strip USE AS DIRECTED    Lancets (ONETOUCH ULTRASOFT) lancets Use  as instructed    methocarbamol (ROBAXIN) 500 MG tablet Take 1 tablet (500 mg total) by mouth every 6 (six) hours as needed.    nystatin cream (MYCOSTATIN) Apply 1 Application topically 2 (two) times daily.    oxybutynin (DITROPAN XL) 15 MG 24 hr tablet Take 1 tablet (15 mg total) by mouth daily.    potassium chloride SA (KLOR-CON M) 20 MEQ tablet TAKE 1 TABLET BY MOUTH DAILY    traMADol (ULTRAM) 50 MG tablet Take 25-50 mg by mouth 2 (two) times daily as needed. 03/04/2022: Prescribed by Burman Freestone, NP   triamcinolone ointment (KENALOG) 0.1 % APPLY TOPICALLY TWICE DAILY AS NEEDED. GENERIC EQUIVALENT FOR KENALOG.    vitamin B-12 (CYANOCOBALAMIN) 500 MCG tablet Take 500 mcg by mouth daily.    Vitamin D, Cholecalciferol, 25 MCG (1000 UT) TABS Take 1 capsule by mouth daily.    No facility-administered encounter medications on file as of 02/22/2023.    Recent Office Vitals: BP Readings from Last 3 Encounters:  12/21/22 122/70  09/02/22 (!) 150/75  08/19/22 122/70   Pulse Readings from Last 3 Encounters:  12/21/22 92  09/02/22 85  08/19/22 93    Wt Readings from Last 3 Encounters:  12/21/22 263 lb 6.4 oz (119.5 kg)  09/02/22 268 lb (121.6 kg)  08/19/22 268 lb 8 oz (121.8 kg)     Kidney Function Lab Results  Component Value Date/Time   CREATININE 1.32 (H) 08/19/2022 02:00 PM   CREATININE  1.28 (H) 12/17/2021 11:39 AM   GFRNONAA 46 (L) 12/13/2020 11:44 AM   GFRAA 53 (L) 12/13/2020 11:44 AM       Latest Ref Rng & Units 08/19/2022    2:00 PM 12/17/2021   11:39 AM 12/13/2020   11:44 AM  BMP  Glucose 65 - 99 mg/dL 161  096  045   BUN 7 - 25 mg/dL 30  29  27    Creatinine 0.60 - 1.00 mg/dL 4.09  8.11  9.14   BUN/Creat Ratio 6 - 22 (calc) 23  23  23    Sodium 135 - 146 mmol/L 145  140  142   Potassium 3.5 - 5.3 mmol/L 4.5  4.9  5.2   Chloride 98 - 110 mmol/L 104  103  105   CO2 20 - 32 mmol/L 32  30  28   Calcium 8.6 - 10.4 mg/dL 9.9  78.2  95.6    Current antihypertensive regimen:   Furosemide 40 mg daily  Benazepril 5 mg daily    Patient verbally confirms she is taking the above medications as directed. Yes  How often are you checking your Blood Pressure? Patient isn't taken her BP at home, but she stated when she was doing physical therapy in the home they would take it a few times a week and it was always normal.  Any readings above 180/100? No Although patient isn't taking her BP she denies any hypertension symptoms  What recent interventions/DTPs have been made by any provider to improve Blood Pressure control since last CPP Visit: None ID  Any recent hospitalizations or ED visits since last visit with CPP? No  What diet changes have been made to improve Blood Pressure Control?  Patient has [employer] wheels delivery her meals daily. Patient hasn't made any new diet changes   Adherence Review: Is the patient currently on ACE/ARB medication? Yes Does the patient have >5 day gap between last estimated fill dates? No  Star Rating Drugs:  Trulicity 3 mg patient receives medication via [employer] patient assistance prorgram Atorvastatin 20 mg last filled on 02/03/2023 for a 90-DS with [facility] Benazepril 5 mg last filled on 02/02/2023 for a 90-DS with [facility] Farxiga 10 mg patient receives medication via [facility] patient assistance program  I spoke with the patient and she reports that she is doing well. Patient denies any ill symptoms at this time. Per patient she is taking all medications as directed with no issues.   Patient recently had her Trulicity increased to 3 mg weekly and stated that she is tolerating the increase well. Per patient she is feeling really well lately and she thinks it may be due to that change in therapy. Patient stated she has not checked her blood sugars recently but denies any hypoglycemic symptoms. Patient reports that she is receiving the medication from [facility]  with no problems, and she just received a new order of her Marcelline Deist from [facility].  Patient does report that she hasn't been sleeping well due to her pain at night sometimes. Patient is currently being prescribed Tramadol for her pain and she is allowed to take 1/2 to 1 whole pill twice daily as needed. Patient reports that she wasn't aware that she could take a whole pill at night prior to bedtime. Patient stated during the day she doesn't really take the Tramadol unless she absolutely needs it. Patient stated she will start taking a whole one at night  to assist with her pain. She does have a follow-up with Pain Management in a few months as well.  Patient has no other concerns or issues at this time.    Adelene Idler, CPA/CMA Clinical Pharmacist Assistant Phone: [phone] .

## 2023-02-23 ENCOUNTER — Other Ambulatory Visit: Payer: Self-pay | Admitting: Family Medicine

## 2023-02-23 DIAGNOSIS — J4520 Mild intermittent asthma, uncomplicated: Secondary | ICD-10-CM

## 2023-02-23 MED ORDER — ALBUTEROL SULFATE HFA 108 (90 BASE) MCG/ACT IN AERS: 2.0000 | INHALATION_SPRAY | Freq: Four times a day (QID) | RESPIRATORY_TRACT | 5 refills | Status: DC | PRN

## 2023-02-23 MED ORDER — ALBUTEROL SULFATE HFA 108 (90 BASE) MCG/ACT IN AERS: 2.0000 | INHALATION_SPRAY | Freq: Four times a day (QID) | RESPIRATORY_TRACT | 5 refills | Status: AC | PRN

## 2023-02-23 NOTE — Telephone Encounter (Signed)
Requested Prescriptions  Pending Prescriptions Disp Refills   albuterol (VENTOLIN HFA) 108 (90 Base) MCG/ACT inhaler 8 g 5    Sig: Inhale 2 puffs into the lungs every 6 (six) hours as needed for wheezing or shortness of breath.     Pulmonology:  Beta Agonists 2 Passed - 02/23/2023  4:52 PM      Passed - Last BP in normal range    BP Readings from Last 1 Encounters:  12/21/22 122/70         Passed - Last Heart Rate in normal range    Pulse Readings from Last 1 Encounters:  12/21/22 92         Passed - Valid encounter within last 12 months    Recent Outpatient Visits           2 months ago Controlled type 2 diabetes mellitus with stage 3 chronic kidney disease, without long-term current use of insulin (HCC)   [place] [facility] Alba Cory, MD   6 months ago Controlled type 2 diabetes mellitus with stage 3 chronic kidney disease, without long-term current use of insulin [facility])   [place] [facility] Alba Cory, MD   7 months ago Well adult exam   [facility] Alba Cory, MD   8 months ago OSA on CPAP   [facility] Alba Cory, MD   10 months ago Controlled type 2 diabetes mellitus with stage 3 chronic kidney disease, without long-term current use of insulin [facility])   [place] [facility] Alba Cory, MD       Future Appointments             In 2 months Carlynn Purl, Danna Hefty, MD [facility]   In 5 months Alba Cory, MD [facility]   In 6 months Vanna Scotland, MD [facility] [place]

## 2023-02-23 NOTE — Telephone Encounter (Signed)
Pt called saying the prescription for her Allbuterol was sent to the [facility] and she needed [facility].  She said it is a lot more expensive at the local pharmacy.  Can that prescription be cancelled and sent to the mail order  CB#  [phone]

## 2023-02-23 NOTE — Addendum Note (Signed)
Addended by: Julious Payer A on: 02/23/2023 02:21 PM   Modules accepted: Orders

## 2023-03-01 ENCOUNTER — Encounter: Payer: Self-pay | Admitting: Oncology

## 2023-03-16 DIAGNOSIS — K080 Exfoliation of teeth due to systemic causes: Secondary | ICD-10-CM | POA: Diagnosis not present

## 2023-03-17 DIAGNOSIS — J449 Chronic obstructive pulmonary disease, unspecified: Secondary | ICD-10-CM | POA: Diagnosis not present

## 2023-03-31 ENCOUNTER — Ambulatory Visit: Payer: Medicare Other

## 2023-03-31 NOTE — Progress Notes (Unsigned)
Care Management & Coordination Services Pharmacy Note  03/31/2023 Name:  Amber Avery MRN:  [id number] DOB:  [birth year]  Summary: ***  Recommendations/Changes made from today's visit: ***  Follow up plan: ***   Patient-Specific Goals:  Subjective: Amber Avery is an 77 y.o. year old female who is a primary patient of Alba Cory, MD.  The care coordination team was consulted for assistance with disease management and care coordination needs.    Engaged with patient by telephone for follow up visit.  Recent office visits: 12/21/22: Patient presented to Dr. Carlynn Purl for follow-up.   Recent consult visits: ***  Hospital visits: {Hospital DC Yes/No:25215}   Objective:  Lab Results  Component Value Date   CREATININE 1.32 (H) 08/19/2022   BUN 30 (H) 08/19/2022   EGFR 42 (L) 08/19/2022   GFRNONAA 46 (L) 12/13/2020   GFRAA 53 (L) 12/13/2020   NA 145 08/19/2022   K 4.5 08/19/2022   CALCIUM 9.9 08/19/2022   CO2 32 08/19/2022   GLUCOSE 104 (H) 08/19/2022    Lab Results  Component Value Date/Time   HGBA1C 8.8 (A) 12/21/2022 02:17 PM   HGBA1C 7.0 (A) 08/19/2022 01:24 PM   HGBA1C 6.6 (H) 12/17/2021 11:39 AM   HGBA1C 6.0 04/14/2019 10:33 AM   HGBA1C 6.3 12/08/2018 11:22 AM   MICROALBUR 0.6 08/19/2022 02:00 PM   MICROALBUR 1.4 12/17/2021 11:39 AM   MICROALBUR 20 04/11/2018 10:53 AM   MICROALBUR 20 08/09/2017 09:57 AM    Last diabetic Eye exam:  Lab Results  Component Value Date/Time   HMDIABEYEEXA No Retinopathy 10/06/2022 12:00 AM    Last diabetic Foot exam: No results found for: "HMDIABFOOTEX"   Lab Results  Component Value Date   CHOL 137 08/19/2022   HDL 55 08/19/2022   LDLCALC 59 08/19/2022   TRIG 147 08/19/2022   CHOLHDL 2.5 08/19/2022       Latest Ref Rng & Units 08/19/2022    2:00 PM 12/17/2021   11:39 AM 12/13/2020   11:44 AM  Hepatic Function  Total Protein 6.1 - 8.1 g/dL 6.6  6.6  7.1   AST 10 - 35 U/L 20  24  34   ALT 6 - 29 U/L 17  19   28    Total Bilirubin 0.2 - 1.2 mg/dL 0.6  0.7  0.7     Lab Results  Component Value Date/Time   TSH 4.400 12/09/2015 10:10 AM   TSH 4.380 09/18/2015 11:39 AM       Latest Ref Rng & Units 08/19/2022    2:00 PM 12/17/2021   11:39 AM 12/13/2020   11:44 AM  CBC  WBC 3.8 - 10.8 Thousand/uL 7.7  6.9  6.1   Hemoglobin 11.7 - 15.5 g/dL 40.9  81.1  91.4   Hematocrit 35.0 - 45.0 % 37.1  38.0  38.9   Platelets 140 - 400 Thousand/uL 215  220  208     Lab Results  Component Value Date/Time   VD25OH 37 12/17/2021 11:39 AM   VD25OH 39 12/13/2020 11:44 AM   VITAMINB12 1,050 12/17/2021 11:39 AM   VITAMINB12 >2,000 (H) 12/13/2020 11:44 AM    Clinical ASCVD: {YES/NO:21197} The 10-year ASCVD risk score (Arnett DK, et al., 2019) is: 33%   Values used to calculate the score:     Age: 89 years     Sex: Female     Is Non-Hispanic African American: No     Diabetic: Yes     Tobacco smoker: No  Systolic Blood Pressure: 122 mmHg     Is BP treated: Yes     HDL Cholesterol: 55 mg/dL     Total Cholesterol: 137 mg/dL    ***Other: ([id number] if Afib, MMRC or CAT for COPD, ACT, DEXA)     12/21/2022    2:15 PM 08/19/2022    1:20 PM 07/07/2022    1:04 PM  Depression screen PHQ 2/9  Decreased Interest 0 0 0  Down, Depressed, Hopeless 0 0 0  PHQ - 2 Score 0 0 0  Altered sleeping 0 1 2  Tired, decreased energy 0 1 1  Change in appetite 0 0 0  Feeling bad or failure about yourself  0 0 0  Trouble concentrating 0 0 0  Moving slowly or fidgety/restless 0 0 0  Suicidal thoughts 0 0 0  PHQ-9 Score 0 2 3     Social History   Tobacco Use  Smoking Status Former   Packs/day: 2.00   Years: 15.00   Additional pack years: 0.00   Total pack years: 30.00   Types: Cigarettes   Quit date: 44   Years since quitting: 41.4  Smokeless Tobacco Never  Tobacco Comments   smoking cessation materials not required   BP Readings from Last 3 Encounters:  12/21/22 122/70  09/02/22 (!) 150/75  08/19/22  122/70   Pulse Readings from Last 3 Encounters:  12/21/22 92  09/02/22 85  08/19/22 93   Wt Readings from Last 3 Encounters:  12/21/22 263 lb 6.4 oz (119.5 kg)  09/02/22 268 lb (121.6 kg)  08/19/22 268 lb 8 oz (121.8 kg)   BMI Readings from Last 3 Encounters:  12/21/22 49.77 kg/m  09/02/22 50.64 kg/m  08/19/22 50.73 kg/m    Allergies  Allergen Reactions   Codeine Hives and Nausea And Vomiting    Medications Reviewed Today     Reviewed by Alba Cory, MD (Physician) on 12/21/22 at 1441  Med List Status: <None>   Medication Order Taking? Sig Documenting Provider Last Dose Status Informant  acetaminophen (TYLENOL) 500 MG tablet [id number] Yes Take 1 tablet (500 mg total) by mouth every 6 (six) hours as needed.  Patient taking differently: Take 1,000 mg by mouth every 8 (eight) hours.   Alba Cory, MD Taking Active   albuterol (VENTOLIN HFA) 108 (90 Base) MCG/ACT inhaler [id number] Yes Inhale 2 puffs into the lungs every 6 (six) hours as needed for wheezing or shortness of breath. Alba Cory, MD Taking Active   allopurinol (ZYLOPRIM) 300 MG tablet [id number] Yes TAKE 1 TABLET BY MOUTH DAILY Sowles, Danna Hefty, MD Taking Active   atorvastatin (LIPITOR) 20 MG tablet [id number] Yes TAKE 1 TABLET BY MOUTH DAILY FOR CHOLESTEROL Carlynn Purl, Danna Hefty, MD Taking Active   benazepril (LOTENSIN) 5 MG tablet [id number] Yes TAKE 1 TABLET BY MOUTH DAILY Alba Cory, MD Taking Active   Blood Pressure KIT [id number] Yes 1 each by Does not apply route daily. Alba Cory, MD Taking Active   dapagliflozin propanediol (FARXIGA) 10 MG TABS tablet [id number] Yes Take 1 tablet (10 mg total) by mouth daily before breakfast. Alba Cory, MD Taking Active   diclofenac Sodium (VOLTAREN) 1 % GEL [id number] Yes APPLY 4 GRAMS TOPICALLY FOUR TIMES DAILY AS DIRECTED GENERIC EQUIVALENT FOR VOL.Monte Fantasia, Danna Hefty, MD Taking Active   Dulaglutide (TRULICITY) 1.5 MG/0.5ML SOPN [id number] Yes Inject 1.5  mg into the skin once a week. Alba Cory, MD Taking Active   furosemide (LASIX) 40 MG tablet [id number] Yes TAKE 1 TABLET BY  MOUTH DAILY Sowles, Danna Hefty, MD Taking Active   gabapentin (NEURONTIN) 300 MG capsule [id number] Yes TAKE 1 CAPSULE BY MOUTH IN THE MORNING, 1 CAPSULE IN THE EVENING AND 2 CAPSULES AT Mittie Bodo, Danna Hefty, MD Taking Active   glucose blood [facility] VERIO) test strip [id number] Yes USE AS DIRECTED Alba Cory, MD Taking Active   Lancets [facility] ULTRASOFT) lancets [id number] Yes Use as instructed Alba Cory, MD Taking Active   methocarbamol (ROBAXIN) 500 MG tablet [id number] Yes Take 1 tablet (500 mg total) by mouth every 6 (six) hours as needed. Tommi Rumps, PA-C Taking Active   nystatin cream (MYCOSTATIN) [id number] Yes Apply 1 Application topically 2 (two) times daily. Linzie Collin, MD Taking Active   oxybutynin (DITROPAN XL) 15 MG 24 hr tablet [id number] Yes Take 1 tablet (15 mg total) by mouth daily. Vanna Scotland, MD Taking Active   potassium chloride SA (KLOR-CON M) 20 MEQ tablet [id number] Yes TAKE 1 TABLET BY MOUTH DAILY Sowles, Danna Hefty, MD Taking Active   traMADol (ULTRAM) 50 MG tablet [id number] Yes Take 25-50 mg by mouth 2 (two) times daily as needed. Merri Ray, MD Taking Active            Med Note Gaspar Cola   Wed Mar 04, 2022  3:03 PM)  Prescribed by Burman Freestone, NP  triamcinolone ointment (KENALOG) 0.1 % [id number] Yes APPLY TOPICALLY TWICE DAILY AS NEEDED. GENERIC EQUIVALENT FOR KENALOG. Alba Cory, MD Taking Active   vitamin B-12 (CYANOCOBALAMIN) 500 MCG tablet [id number] Yes Take 500 mcg by mouth daily. [provider] Taking Active   Vitamin D, Cholecalciferol, 25 MCG (1000 UT) TABS [id number] Yes Take 1 capsule by mouth daily. Alba Cory, MD Taking Active             SDOH:  (Social Determinants of Health) assessments and interventions performed: {yes/no:20286} SDOH Interventions     Flowsheet Row Chronic Care Management from 09/03/2021 in [facility] Clinical Support from 06/24/2021 in [facility] Chronic Care Management from 10/03/2020 in [facility] Chronic Care Management from 04/30/2020 in [facility]  SDOH Interventions      Food Insecurity Interventions -- Intervention Not Indicated -- --  Housing Interventions -- Intervention Not Indicated -- --  Transportation Interventions -- Intervention Not Indicated Intervention Not Indicated --  Financial Strain Interventions Other (Comment)  [PAP] Intervention Not Indicated Intervention Not Indicated Other (Comment)  [PAP as needed]  Physical Activity Interventions -- Intervention Not Indicated -- --  Stress Interventions -- Intervention Not Indicated -- --  Social Connections Interventions -- Intervention Not Indicated -- --       Medication Assistance:  Trulicity obtained through [employer] medication assistance program.  Enrollment ends Dec 2024   Farxiga obtained [place].   Medication Access: Within the past 30 days, how often has patient missed a dose of medication? *** Is a pillbox or other method used to improve adherence? {YES/NO:21197} Factors that may affect medication adherence? {CHL DESC; BARRIERS:21522} Are meds synced by current pharmacy? {YES/NO:21197} Are meds delivered by current pharmacy? {YES/NO:21197} Does patient experience delays in picking up medications due to transportation concerns? {YES/NO:21197}  Compliance/Adherence/Medication fill history: Care Gaps: ***  Star-Rating Drugs: ***   Assessment/Plan  Hypertension (BP goal <130/80) -Controlled -Current treatment: Furosemide 40 mg daily  Benazepril 5 mg daily  -Medications previously tried: NA  -Current home readings:  130/67 114/59 -Denies hypotensive/hypertensive symptoms -Counseled  to  monitor BP at home weekly, document, and provide log at future appointments -Recommended to continue current medication  Hyperlipidemia: (LDL goal < 70) -Controlled -Current treatment: Atorvastatin 20 mg daily: Appropriate, Effective, Safe, Accessible   -Medications previously tried: NA  -Recommended to continue current medication  Diabetes (A1c goal <8%) -Controlled -Current medications: Farxiga 10 mg daily: Appropriate, Effective, Safe, Accessible  Trulicity 3 mg weekly  -Medications previously tried: NA  -Current home glucose readings fasting glucose: 119, Average 135.   post prandial glucose: NA -Denies hypoglycemic/hyperglycemic symptoms -Recommended to continue current medication  Asthma (Goal: control symptoms and prevent exacerbations) -Controlled -Current treatment  Ventolin HFA  -Medications previously tried: NA  -Exacerbations requiring treatment in last 6 months: NA -Frequency of rescue inhaler use: Sporadic, varies based on activity -Counseled on Benefits of consistent maintenance inhaler use -Recommended to continue current medication  Chronic Pain (Goal: Maintain adequate pain control) -Not ideally controlled -Current treatment  Diclofenac 1 % gel three times daily  Gabapentin 500 mg 1 tablet AM, 1 PM, 2 HS  Acetaminophen 500 mg every 6 hours as needed  -Medications previously tried: NA -Patient with worsening hip pain recently. Tries to limit gabapentin when possible.  -Counseled to limit APAP to 3000 mg daily   -Recommended to continue current medication    Follow Up Plan: {CM FOLLOW UP ZOXW:96045}    ***

## 2023-04-14 ENCOUNTER — Other Ambulatory Visit: Payer: Self-pay | Admitting: Family Medicine

## 2023-04-14 DIAGNOSIS — M5416 Radiculopathy, lumbar region: Secondary | ICD-10-CM

## 2023-04-14 DIAGNOSIS — I10 Essential (primary) hypertension: Secondary | ICD-10-CM

## 2023-04-17 DIAGNOSIS — J449 Chronic obstructive pulmonary disease, unspecified: Secondary | ICD-10-CM | POA: Diagnosis not present

## 2023-04-26 NOTE — Progress Notes (Unsigned)
Name: Amber Avery   MRN: [id number]    DOB: August 21, 1946   Date:04/27/2023       Progress Note  Subjective  Chief Complaint  Follow Up  HPI  HTN:   She is now on benazepril 5 mg. No chest pain, palpitation, dizziness  or change in exercise tolerance. BP is at goal, no orthostatic changes   Dyslipidemia: taking statin therapy, no side effects, no chest pain or myalgia. Last LDL was back to normal at 59 . We will recheck next visit    Asthma Mild intermittent: she states she has been doing well, no wheezing, cough , mild SOB with activity but likely multifactorial. Unchanged   DMII: She is on Farxiga and Trulicity 3 mg through assistance program , A1C is down from 8.8 % to 7.2 %   no polyphagia, polyuria (stable because of diuretic) denies  polydipsia . She has dyslipidemia and last LDL was at goal., she has   CKI stage III she is on low dose ACE but last urine micro elevated but is on SGL-2 agonist since , takes gabapentin for neuropathy Offered to recheck labs today but she wants to hold off until next visit   Morbid obesity: BMI over 40 , weight was down on her last visit due to tooth problems, now weight is stable.    OSA: she wear machine every night, she uses oxygen with CPAP and is compliant. Unchanged     OA : she saw Ortho , Dr. Landry Mellow, and he advised her not to have surgery because of her weight, she had one round of hyaluronic injection without help and it was too expensive . She is still taking Tylenol She still has intermittent right knee effusion when more active, no redness or increase in warmth. She has a hoveround, uses walker and sometimes cane. She is going back to see Psyatrist     Iron deficiency anemia: last level was better, under the care of Dr. Cathie Hoops and had 5 iron infusions in the past but CBC has been back to normal, last ferritin was normal at 52 done 08/2022, last visit with GI was with Dr. Servando Snare - GI in 2020 . We will keep monitoring yearly   Post-menopausal  bleeding : she had polyp removal and also endometrial biopsy done end of October 23 no longer having spotting or pain . Unchanged       Senile purpura: Stable    DDD lumbar spine and radiculitis: seen by Psyatrist , seeing Whitney FNP, she has MRI lumbar spine, steroid injection for trochanteric bursitis did not work, she also had PT . She is still taking Gabapentin and takes Tramadol every night but only prn during the day when the pain is intense, she also takes Methocarbamol also.  She states pain is worse in the mornings. No longer has daily pain   B12 deficiency: taking supplements , last level at goal . Unchanged   Controlled gout: taking allopurinol , uric acid has been at goal, no recent episodes . Refills being sent to pharmacy   Patient Active Problem List   Diagnosis Date Noted   Greater trochanteric bursitis, left 06/18/2022   Gait instability 12/13/2020   Stage 3a chronic kidney disease (HCC) 12/13/2020   Cellulitis of right ankle 04/12/2020   Morbid obesity with BMI of 45.0-49.9, adult (HCC) 02/06/2020   History of gastritis 08/14/2019   Angiodysplasia of stomach and duodenum    Iron deficiency anemia 04/20/2018   Anemia, unspecified 04/12/2017  Lichen sclerosus of female genitalia 06/29/2016   Primary osteoarthritis of both knees 04/07/2016   Special screening for malignant neoplasms, colon    Right thyroid nodule 06/26/2015   Asthma, intermittent 04/03/2015   Benign essential HTN 04/03/2015   Carpal tunnel syndrome 04/03/2015   Chronic kidney disease (CKD), stage II (mild) 04/03/2015   Controlled gout 04/03/2015   Arteriosclerosis of coronary artery 04/03/2015   Diabetes mellitus with renal manifestations, controlled (HCC) 04/03/2015   Dyslipidemia 04/03/2015   Edema extremities 04/03/2015   Elevated sedimentation rate 04/03/2015   Knee pain 04/03/2015   Lumbar radiculitis 04/03/2015   Obstructive apnea 04/03/2015   Lumbosacral spondylosis 04/03/2015    Osteoarthritis, chronic 04/03/2015   Psoriasis 04/03/2015   Vitamin D deficiency 04/03/2015   Varicose veins 04/03/2015   Goiter 12/23/2014   Degeneration of intervertebral disc of lumbar region 08/23/2014   Corns and callosity 06/11/2009    Past Surgical History:  Procedure Laterality Date   APPENDECTOMY     CATARACT EXTRACTION W/PHACO Left 08/14/2020   Procedure: CATARACT EXTRACTION PHACO AND INTRAOCULAR LENS PLACEMENT (IOC) LEFT DIABETIC 3.56 00:59.6 6.0%;  Surgeon: Lockie Mola, MD;  Location: [facility];  Service: Ophthalmology;  Laterality: Left;  Diabetic   CATARACT EXTRACTION W/PHACO Right 09/04/2020   Procedure: CATARACT EXTRACTION PHACO AND INTRAOCULAR LENS PLACEMENT (IOC) RIGHT DIABETIC;  Surgeon: Lockie Mola, MD;  Location: [facility];  Service: Ophthalmology;  Laterality: Right;  4.72 1:03.2 7.4%   CHOLECYSTECTOMY  1977   COLONOSCOPY WITH PROPOFOL N/A 01/21/2016   Procedure: COLONOSCOPY WITH PROPOFOL;  Surgeon: Midge Minium, MD;  Location: [facility];  Service: Endoscopy;  Laterality: N/A;   DILATION AND CURETTAGE OF UTERUS     Due to Amenorrhea   ESOPHAGOGASTRODUODENOSCOPY (EGD) WITH PROPOFOL N/A 08/16/2018   Procedure: ESOPHAGOGASTRODUODENOSCOPY (EGD) WITH PROPOFOL;  Surgeon: Midge Minium, MD;  Location: [facility] END[facility];  Service: Endoscopy;  Laterality: N/A;   HEMORRHOID BANDING  1980   HEMORROIDECTOMY     HERNIA REPAIR  2011   Temporal Area Excision Biopsy     For Birth Mark Changes, Negative Pathology   TONSILLECTOMY AND ADENOIDECTOMY      Family History  Problem Relation Age of Onset   Cancer Mother        brain tumor   Kidney disease Mother    Hypertension Mother    Heart disease Father    COPD Father    Hypertension Sister    Arthritis Sister    Cancer Sister    Heart murmur Sister    GER disease Sister    Lupus Sister    Diabetes Sister    Arthritis Sister    Osteopenia Sister    Heart disease Sister     Hypertension Sister    Breast cancer Maternal Aunt 61   Leukemia Maternal Aunt     Social History   Tobacco Use   Smoking status: Former    Packs/day: 2.00    Years: 15.00    Additional pack years: 0.00    Total pack years: 30.00    Types: Cigarettes    Quit date: 48    Years since quitting: 41.5   Smokeless tobacco: Never   Tobacco comments:    smoking cessation materials not required  Substance Use Topics   Alcohol use: Not Currently    Alcohol/week: 0.0 standard drinks of alcohol     Current Outpatient Medications:    acetaminophen (TYLENOL) 500 MG tablet, Take 1 tablet (500 mg total) by mouth  every 6 (six) hours as needed. (Patient taking differently: Take 1,000 mg by mouth every 8 (eight) hours.), Disp: 480 tablet, Rfl: 0   albuterol (VENTOLIN HFA) 108 (90 Base) MCG/ACT inhaler, Inhale 2 puffs into the lungs every 6 (six) hours as needed for wheezing or shortness of breath., Disp: 8 g, Rfl: 5   allopurinol (ZYLOPRIM) 300 MG tablet, TAKE 1 TABLET BY MOUTH DAILY, Disp: 90 tablet, Rfl: 3   atorvastatin (LIPITOR) 20 MG tablet, TAKE 1 TABLET BY MOUTH DAILY FOR CHOLESTEROL, Disp: 90 tablet, Rfl: 0   benazepril (LOTENSIN) 5 MG tablet, TAKE 1 TABLET BY MOUTH DAILY, Disp: 90 tablet, Rfl: 3   Blood Pressure KIT, 1 each by Does not apply route daily., Disp: 1 kit, Rfl: 0   dapagliflozin propanediol (FARXIGA) 10 MG TABS tablet, Take 1 tablet (10 mg total) by mouth daily before breakfast., Disp: 90 tablet, Rfl: 3   diclofenac Sodium (VOLTAREN) 1 % GEL, APPLY 4 GRAMS TOPICALLY FOUR TIMES DAILY AS DIRECTED; GENERIC EQUIVALENT FOR VOLTAREN, Disp: 300 g, Rfl: 1   Dulaglutide (TRULICITY) 3 MG/0.5ML SOPN, Inject 3 mg as directed once a week., Disp: 12 mL, Rfl: 3   furosemide (LASIX) 40 MG tablet, TAKE 1 TABLET BY MOUTH DAILY, Disp: 90 tablet, Rfl: 3   gabapentin (NEURONTIN) 300 MG capsule, TAKE 1 CAPSULE BY MOUTH IN THE MORNING, 1 CAPSULE IN THE EVENING AND 2 CAPSULES AT NIGHT, Disp: 360  capsule, Rfl: 3   glucose blood (ONETOUCH VERIO) test strip, USE AS DIRECTED, Disp: 50 each, Rfl: 1   Lancets (ONETOUCH ULTRASOFT) lancets, Use as instructed, Disp: 100 each, Rfl: 12   methocarbamol (ROBAXIN) 500 MG tablet, Take 500 mg by mouth 2 (two) times daily., Disp: , Rfl:    nystatin cream (MYCOSTATIN), Apply 1 Application topically 2 (two) times daily., Disp: 60 g, Rfl: 3   oxybutynin (DITROPAN XL) 15 MG 24 hr tablet, Take 1 tablet (15 mg total) by mouth daily., Disp: 90 tablet, Rfl: 3   potassium chloride SA (KLOR-CON M) 20 MEQ tablet, TAKE 1 TABLET BY MOUTH DAILY, Disp: 90 tablet, Rfl: 3   traMADol (ULTRAM) 50 MG tablet, Take 25-50 mg by mouth 2 (two) times daily as needed., Disp: , Rfl:    triamcinolone ointment (KENALOG) 0.1 %, APPLY TOPICALLY TWICE DAILY AS NEEDED. GENERIC EQUIVALENT FOR KENALOG., Disp: 90 g, Rfl: 0   vitamin B-12 (CYANOCOBALAMIN) 500 MCG tablet, Take 500 mcg by mouth daily., Disp: , Rfl:    Vitamin D, Cholecalciferol, 25 MCG (1000 UT) TABS, Take 1 capsule by mouth daily., Disp: 90 tablet, Rfl: 1  Allergies  Allergen Reactions   Codeine Hives and Nausea And Vomiting    I personally reviewed active problem list, medication list, allergies, family history, social history, health maintenance with the patient/caregiver today.   ROS  Ten systems reviewed and is negative except as mentioned in HPI   Objective  Vitals:   04/27/23 1342  BP: 120/70  Pulse: 94  Resp: 16  Temp: 98.2 F (36.8 C)  TempSrc: Oral  SpO2: 92%  Weight: 262 lb 6.4 oz (119 kg)  Height: 5\' 1"  (1.549 m)    Body mass index is 49.58 kg/m.  Physical Exam  Constitutional: Patient appears well-developed and well-nourished. Obese  No distress.  HEENT: head atraumatic, normocephalic, pupils equal and reactive to light, neck supple Cardiovascular: Normal rate, regular rhythm and normal heart sounds.  No murmur heard. Positive for trace  BLE edema 1 plus on ankles .  Pulmonary/Chest:  Effort normal and breath sounds normal. No respiratory distress. Abdominal: Soft.  There is no tenderness. Psychiatric: Patient has a normal mood and affect. behavior is normal. Judgment and thought content normal.   Recent Results (from the past 2160 hour(s))  POCT HgB A1C     Status: Abnormal   Collection Time: 04/27/23  1:46 PM  Result Value Ref Range   Hemoglobin A1C 7.2 (A) 4.0 - 5.6 %   HbA1c POC (<> result, manual entry)     HbA1c, POC (prediabetic range)     HbA1c, POC (controlled diabetic range)       PHQ2/9:    04/27/2023    1:44 PM 12/21/2022    2:15 PM 08/19/2022    1:20 PM 07/07/2022    1:04 PM 06/25/2022    9:53 AM  Depression screen PHQ 2/9  Decreased Interest 0 0 0 0 0  Down, Depressed, Hopeless 0 0 0 0 0  PHQ - 2 Score 0 0 0 0 0  Altered sleeping 0 0 1 2 1   Tired, decreased energy 0 0 1 1 0  Change in appetite 0 0 0 0 1  Feeling bad or failure about yourself  0 0 0 0 0  Trouble concentrating 0 0 0 0 0  Moving slowly or fidgety/restless 0 0 0 0 0  Suicidal thoughts 0 0 0 0 0  PHQ-9 Score 0 0 2 3 2     phq 9 is negative   Fall Risk:    04/27/2023    1:44 PM 12/21/2022    2:06 PM 08/19/2022    1:19 PM 08/12/2022    2:42 PM 07/07/2022    1:04 PM  Fall Risk   Falls in the past year? 0 0 0 0 0  Number falls in past yr:    0 0  Injury with Fall?    0 0  Risk for fall due to : No Fall Risks Impaired balance/gait No Fall Risks Impaired mobility No Fall Risks  Follow up Falls prevention discussed Falls prevention discussed;Education provided;Falls evaluation completed Falls prevention discussed;Education provided;Falls evaluation completed Falls evaluation completed Falls prevention discussed      Functional Status Survey: Is the patient deaf or have difficulty hearing?: No Does the patient have difficulty seeing, even when wearing glasses/contacts?: No Does the patient have difficulty concentrating, remembering, or making decisions?: No Does the patient have  difficulty walking or climbing stairs?: Yes Does the patient have difficulty dressing or bathing?: No Does the patient have difficulty doing errands alone such as visiting a doctor's office or shopping?: No    Assessment & Plan  1. Controlled type 2 diabetes mellitus with stage 3 chronic kidney disease, without long-term current use of insulin (HCC)  - POCT HgB A1C - dapagliflozin propanediol (FARXIGA) 10 MG TABS tablet; Take 1 tablet (10 mg total) by mouth daily before breakfast.  Dispense: 90 tablet; Refill: 3  2. Controlled gout  - allopurinol (ZYLOPRIM) 300 MG tablet; Take 1 tablet (300 mg total) by mouth daily.  Dispense: 90 tablet; Refill: 3  3. Dyslipidemia  - atorvastatin (LIPITOR) 20 MG tablet; TAKE 1 TABLET BY MOUTH DAILY FOR CHOLESTEROL  Dispense: 90 tablet; Refill: 3  4. Stage 3a chronic kidney disease (HCC)  - dapagliflozin propanediol (FARXIGA) 10 MG TABS tablet; Take 1 tablet (10 mg total) by mouth daily before breakfast.  Dispense: 90 tablet; Refill: 3 - benazepril (LOTENSIN) 5 MG tablet; Take 1 tablet (5 mg total) by mouth daily.  Dispense: 90 tablet; Refill: 3  5. Benign essential HTN  - benazepril (LOTENSIN) 5 MG tablet; Take 1 tablet (5 mg total) by mouth daily.  Dispense: 90 tablet; Refill: 3  6. Bilateral edema of lower extremity  - furosemide (LASIX) 40 MG tablet; Take 1 tablet (40 mg total) by mouth daily.  Dispense: 90 tablet; Refill: 3

## 2023-04-27 ENCOUNTER — Ambulatory Visit (INDEPENDENT_AMBULATORY_CARE_PROVIDER_SITE_OTHER): Payer: Medicare Other | Admitting: Family Medicine

## 2023-04-27 ENCOUNTER — Encounter: Payer: Self-pay | Admitting: Family Medicine

## 2023-04-27 VITALS — BP 120/70 | HR 94 | Temp 98.2°F | Resp 16 | Ht 61.0 in | Wt 262.4 lb

## 2023-04-27 DIAGNOSIS — Z7985 Long-term (current) use of injectable non-insulin antidiabetic drugs: Secondary | ICD-10-CM

## 2023-04-27 DIAGNOSIS — Z7984 Long term (current) use of oral hypoglycemic drugs: Secondary | ICD-10-CM

## 2023-04-27 DIAGNOSIS — M109 Gout, unspecified: Secondary | ICD-10-CM | POA: Diagnosis not present

## 2023-04-27 DIAGNOSIS — N183 Chronic kidney disease, stage 3 unspecified: Secondary | ICD-10-CM | POA: Diagnosis not present

## 2023-04-27 DIAGNOSIS — N1831 Chronic kidney disease, stage 3a: Secondary | ICD-10-CM

## 2023-04-27 DIAGNOSIS — E785 Hyperlipidemia, unspecified: Secondary | ICD-10-CM

## 2023-04-27 DIAGNOSIS — R600 Localized edema: Secondary | ICD-10-CM

## 2023-04-27 DIAGNOSIS — I10 Essential (primary) hypertension: Secondary | ICD-10-CM

## 2023-04-27 DIAGNOSIS — E1122 Type 2 diabetes mellitus with diabetic chronic kidney disease: Secondary | ICD-10-CM

## 2023-04-27 LAB — POCT GLYCOSYLATED HEMOGLOBIN (HGB A1C): Hemoglobin A1C: 7.2 % — AB (ref 4.0–5.6)

## 2023-04-27 MED ORDER — BENAZEPRIL HCL 5 MG PO TABS: 5.0000 mg | ORAL_TABLET | Freq: Every day | ORAL | 3 refills | Status: DC

## 2023-04-27 MED ORDER — ATORVASTATIN CALCIUM 20 MG PO TABS: ORAL_TABLET | ORAL | 3 refills | Status: DC

## 2023-04-27 MED ORDER — DAPAGLIFLOZIN PROPANEDIOL 10 MG PO TABS: 10.0000 mg | ORAL_TABLET | Freq: Every day | ORAL | 3 refills | Status: DC

## 2023-04-27 MED ORDER — ALLOPURINOL 300 MG PO TABS: 300.0000 mg | ORAL_TABLET | Freq: Every day | ORAL | 3 refills | Status: DC

## 2023-04-27 MED ORDER — FUROSEMIDE 40 MG PO TABS: 40.0000 mg | ORAL_TABLET | Freq: Every day | ORAL | 3 refills | Status: DC

## 2023-04-29 DIAGNOSIS — G4733 Obstructive sleep apnea (adult) (pediatric): Secondary | ICD-10-CM | POA: Diagnosis not present

## 2023-04-29 DIAGNOSIS — F5101 Primary insomnia: Secondary | ICD-10-CM | POA: Diagnosis not present

## 2023-04-29 DIAGNOSIS — I10 Essential (primary) hypertension: Secondary | ICD-10-CM | POA: Diagnosis not present

## 2023-05-14 ENCOUNTER — Encounter: Payer: Self-pay | Admitting: Family Medicine

## 2023-05-17 ENCOUNTER — Encounter: Payer: Self-pay | Admitting: Pharmacist

## 2023-05-17 ENCOUNTER — Other Ambulatory Visit: Payer: Self-pay | Admitting: Family Medicine

## 2023-05-17 DIAGNOSIS — N183 Chronic kidney disease, stage 3 unspecified: Secondary | ICD-10-CM

## 2023-05-17 DIAGNOSIS — J449 Chronic obstructive pulmonary disease, unspecified: Secondary | ICD-10-CM | POA: Diagnosis not present

## 2023-05-17 DIAGNOSIS — E1122 Type 2 diabetes mellitus with diabetic chronic kidney disease: Secondary | ICD-10-CM

## 2023-05-17 DIAGNOSIS — N1831 Chronic kidney disease, stage 3a: Secondary | ICD-10-CM

## 2023-05-17 MED ORDER — DAPAGLIFLOZIN PROPANEDIOL 10 MG PO TABS: 10.0000 mg | ORAL_TABLET | Freq: Every day | ORAL | 3 refills | Status: DC

## 2023-05-17 NOTE — Progress Notes (Signed)
   05/17/2023  Patient ID: Amber Avery, female   DOB: Feb 22, 1946, 77 y.o.   MRN: [id number]  Receive a message from patient's PCP requesting outreach to patient regarding a concern related to her [place]. From review of chart, note patient previously worked with Hipolito Bayley.  Outreach to patient today by telephone. Shares that she contacted office because at latest office visit, her prescription for Marcelline Deist was renewed to her regular mail order pharmacy ([facility]), despite patient being enrolled in AZ&Me PAP. Because Rx was sent to [facility] instead, she was charged a copayment.   Recommend patient follow up with [facility] to ask pharmacy to not further refill this prescription, but patient denies need as reports that refills are only sent upon her request.  Plan  1) Will collaborate with clinical team/PCP to request update to patient's medication list to note that in future Farxiga prescriptions to be sent to [facility] (dispensing pharmacy for AZ&Me), rather than [facility], to avoid this occurring in the future for patient  2) Provide patient with contact information AZ&Me patient assistance program via MyChart as patient would like to request program delay her next shipment of Farxiga until has used up current supply  3) Schedule an appointment to assist patient with re-enrollment for both [place] and [place] patient assistance re-enrollment in November.  Follow Up Plan: Clinical Pharmacist will outreach to patient by telephone on 08/25/2023 at 2:00 PM   Estelle Grumbles, PharmD, [facility] [phone]

## 2023-05-20 ENCOUNTER — Ambulatory Visit (INDEPENDENT_AMBULATORY_CARE_PROVIDER_SITE_OTHER): Payer: Medicare Other

## 2023-05-20 VITALS — BP 141/82 | Ht 60.0 in | Wt 262.0 lb

## 2023-05-20 DIAGNOSIS — Z0000 Encounter for general adult medical examination without abnormal findings: Secondary | ICD-10-CM

## 2023-05-20 DIAGNOSIS — E119 Type 2 diabetes mellitus without complications: Secondary | ICD-10-CM | POA: Diagnosis not present

## 2023-05-20 NOTE — Progress Notes (Signed)
Subjective:   Amber Avery is a 77 y.o. female who presents for Medicare Annual (Subsequent) preventive examination.  Visit Complete: Virtual  I connected with  Amber Avery on 05/20/23 by a audio enabled telemedicine application and verified that I am speaking with the correct person using two identifiers.  Patient Location: Home  Provider Location: Office/Clinic  I discussed the limitations of evaluation and management by telemedicine. The patient expressed understanding and agreed to proceed.  Vital Signs: Unable to obtain new vitals due to this being a telehealth visit.  Patient Medicare AWV questionnaire was completed by the patient on 05/16/23; I have confirmed that all information answered by patient is correct and no changes since this date.  Review of Systems    Cardiac Risk Factors include: advanced age (>71men, >48 women);diabetes mellitus;dyslipidemia;hypertension;obesity (BMI >30kg/m2);sedentary lifestyle    Objective:    Today's Vitals   05/20/23 1536  BP: (!) 141/82  Weight: 262 lb (118.8 kg)  Height: 5' (1.524 m)   Body mass index is 51.17 kg/m.     05/20/2023    3:55 PM 12/21/2021    9:29 AM 06/24/2021    3:56 PM 09/04/2020    7:06 AM 08/14/2020    6:34 AM 06/20/2019    3:51 PM 09/01/2018    1:19 PM  Advanced Directives  Does Patient Have a Medical Advance Directive? Yes No Yes Yes Yes Yes Yes  Type of Estate agent of [place];Living will  Healthcare Power of [place];Living will Healthcare Power of [place];Living will Healthcare Power of [place];Living will Healthcare Power of [place];Living will   Does patient want to make changes to medical advance directive?    No - Patient declined No - Patient declined    Copy of Healthcare Power of Attorney in Chart?   Yes - validated most recent copy scanned in chart (See row information) Yes - validated most recent copy scanned in chart (See row information) Yes - validated most recent  copy scanned in chart (See row information) Yes - validated most recent copy scanned in chart (See row information)   Would patient like information on creating a medical advance directive?  No - Patient declined         Current Medications (verified) Outpatient Encounter Medications as of 05/20/2023  Medication Sig   acetaminophen (TYLENOL) 500 MG tablet Take 1 tablet (500 mg total) by mouth every 6 (six) hours as needed. (Patient taking differently: Take 1,000 mg by mouth every 8 (eight) hours.)   albuterol (VENTOLIN HFA) 108 (90 Base) MCG/ACT inhaler Inhale 2 puffs into the lungs every 6 (six) hours as needed for wheezing or shortness of breath.   allopurinol (ZYLOPRIM) 300 MG tablet Take 1 tablet (300 mg total) by mouth daily.   atorvastatin (LIPITOR) 20 MG tablet TAKE 1 TABLET BY MOUTH DAILY FOR CHOLESTEROL   benazepril (LOTENSIN) 5 MG tablet Take 1 tablet (5 mg total) by mouth daily.   Blood Pressure KIT 1 each by Does not apply route daily.   dapagliflozin propanediol (FARXIGA) 10 MG TABS tablet Take 1 tablet (10 mg total) by mouth daily before breakfast.   diclofenac Sodium (VOLTAREN) 1 % GEL APPLY 4 GRAMS TOPICALLY FOUR TIMES DAILY AS DIRECTED; GENERIC EQUIVALENT FOR VOLTAREN   Dulaglutide (TRULICITY) 3 MG/0.5ML SOPN Inject 3 mg as directed once a week.   furosemide (LASIX) 40 MG tablet Take 1 tablet (40 mg total) by mouth daily.   gabapentin (NEURONTIN) 300 MG capsule TAKE 1 CAPSULE  BY MOUTH IN THE MORNING, 1 CAPSULE IN THE EVENING AND 2 CAPSULES AT NIGHT   glucose blood (ONETOUCH VERIO) test strip USE AS DIRECTED   Lancets (ONETOUCH ULTRASOFT) lancets Use as instructed   methocarbamol (ROBAXIN) 500 MG tablet Take 500 mg by mouth 2 (two) times daily.   nystatin cream (MYCOSTATIN) Apply 1 Application topically 2 (two) times daily.   oxybutynin (DITROPAN XL) 15 MG 24 hr tablet Take 1 tablet (15 mg total) by mouth daily.   potassium chloride SA (KLOR-CON M) 20 MEQ tablet TAKE 1 TABLET BY  MOUTH DAILY   traMADol (ULTRAM) 50 MG tablet Take 25-50 mg by mouth 2 (two) times daily as needed.   triamcinolone ointment (KENALOG) 0.1 % APPLY TOPICALLY TWICE DAILY AS NEEDED. GENERIC EQUIVALENT FOR KENALOG.   vitamin B-12 (CYANOCOBALAMIN) 500 MCG tablet Take 500 mcg by mouth daily.   Vitamin D, Cholecalciferol, 25 MCG (1000 UT) TABS Take 1 capsule by mouth daily.   No facility-administered encounter medications on file as of 05/20/2023.    Allergies (verified) Codeine   History: Past Medical History:  Diagnosis Date   Asthma    Diabetes mellitus without complication (HCC)    Gout    Hyperlipidemia    Hypertension    Iron deficiency anemia 04/20/2018   PONV (postoperative nausea and vomiting)    after hemorroid surgery   Shortness of breath dyspnea    Sleep apnea    Past Surgical History:  Procedure Laterality Date   APPENDECTOMY     CATARACT EXTRACTION W/PHACO Left 08/14/2020   Procedure: CATARACT EXTRACTION PHACO AND INTRAOCULAR LENS PLACEMENT (IOC) LEFT DIABETIC 3.56 00:59.6 6.0%;  Surgeon: Lockie Mola, MD;  Location: [facility];  Service: Ophthalmology;  Laterality: Left;  Diabetic   CATARACT EXTRACTION W/PHACO Right 09/04/2020   Procedure: CATARACT EXTRACTION PHACO AND INTRAOCULAR LENS PLACEMENT (IOC) RIGHT DIABETIC;  Surgeon: Lockie Mola, MD;  Location: [facility];  Service: Ophthalmology;  Laterality: Right;  4.72 1:03.2 7.4%   CHOLECYSTECTOMY  1977   COLONOSCOPY WITH PROPOFOL N/A 01/21/2016   Procedure: COLONOSCOPY WITH PROPOFOL;  Surgeon: Midge Minium, MD;  Location: [facility];  Service: Endoscopy;  Laterality: N/A;   DILATION AND CURETTAGE OF UTERUS     Due to Amenorrhea   ESOPHAGOGASTRODUODENOSCOPY (EGD) WITH PROPOFOL N/A 08/16/2018   Procedure: ESOPHAGOGASTRODUODENOSCOPY (EGD) WITH PROPOFOL;  Surgeon: Midge Minium, MD;  Location: [facility];  Service: Endoscopy;  Laterality: N/A;   HEMORRHOID BANDING  1980    HEMORROIDECTOMY     HERNIA REPAIR  2011   Temporal Area Excision Biopsy     For Birth Mark Changes, Negative Pathology   TONSILLECTOMY AND ADENOIDECTOMY     Family History  Problem Relation Age of Onset   Cancer Mother        brain tumor   Kidney disease Mother    Hypertension Mother    Heart disease Father    COPD Father    Hypertension Sister    Arthritis Sister    Cancer Sister    Heart murmur Sister    GER disease Sister    Lupus Sister    Diabetes Sister    Arthritis Sister    Osteopenia Sister    Heart disease Sister    Hypertension Sister    Breast cancer Maternal Aunt 24   Leukemia Maternal Aunt    Social History   Socioeconomic History   Marital status: Divorced    Spouse name: Not on file   Number of  children: 0   Years of education: Not on file   Highest education level: GED or equivalent  Occupational History   Occupation: Retired  Tobacco Use   Smoking status: Former    Current packs/day: 0.00    Average packs/day: 2.0 packs/day for 15.0 years (30.0 ttl pk-yrs)    Types: Cigarettes    Start date: 27    Quit date: 1983    Years since quitting: 41.6   Smokeless tobacco: Never   Tobacco comments:    smoking cessation materials not required  Vaping Use   Vaping status: Never Used  Substance and Sexual Activity   Alcohol use: Not Currently    Alcohol/week: 0.0 standard drinks of alcohol   Drug use: No   Sexual activity: Not Currently  Other Topics Concern   Not on file  Social History Narrative   Patient just moved in with her youngest sister and a roommate    Social Determinants of Health   Financial Resource Strain: Low Risk  (05/16/2023)   Overall Financial Resource Strain (CARDIA)    Difficulty of Paying Living Expenses: Not hard at all  Food Insecurity: No Food Insecurity (05/16/2023)   Hunger Vital Sign    Worried About Running Out of Food in the Last Year: Never true    Ran Out of Food in the Last Year: Never true  Transportation  Needs: No Transportation Needs (05/16/2023)   PRAPARE - Administrator, Civil Service (Medical): No    Lack of Transportation (Non-Medical): No  Physical Activity: Insufficiently Active (05/16/2023)   Exercise Vital Sign    Days of Exercise per Week: 2 days    Minutes of Exercise per Session: 10 min  Stress: No Stress Concern Present (05/16/2023)   [employer] of Occupational Health - Occupational Stress Questionnaire    Feeling of Stress : Not at all  Social Connections: Socially Isolated (05/16/2023)   Social Connection and Isolation Panel [NHANES]    Frequency of Communication with Friends and Family: Once a week    Frequency of Social Gatherings with Friends and Family: Once a week    Attends Religious Services: Never    Database administrator or Organizations: No    Attends Engineer, structural: Never    Marital Status: Divorced    Tobacco Counseling Counseling given: Not Answered Tobacco comments: smoking cessation materials not required   Clinical Intake:  Pre-visit preparation completed: Yes  Pain : No/denies pain   BMI - recorded: 51.17 Nutritional Status: BMI > 30  Obese Nutritional Risks: None Diabetes: Yes CBG done?: Yes (BS 135 this am) CBG resulted in Enter/ Edit results?: No Did pt. bring in CBG monitor from home?: No  How often do you need to have someone help you when you read instructions, pamphlets, or other written materials from your doctor or pharmacy?: 1 - Never  Interpreter Needed?: No  Comments: lives alone Information entered by :: B.,LPN   Activities of Daily Living    05/16/2023    1:40 PM 04/27/2023    1:44 PM  In your present state of health, do you have any difficulty performing the following activities:  Hearing? 0 0  Vision? 0 0  Difficulty concentrating or making decisions? 0 0  Walking or climbing stairs? 1 1  Dressing or bathing? 1 0  Doing errands, shopping? 0 0  Preparing Food and eating ? Y    Using the Toilet? N   In the past six months,  have you accidently leaked urine? Y   Do you have problems with loss of bowel control? N   Managing your Medications? N   Managing your Finances? N   Housekeeping or managing your Housekeeping? Y     Patient Care Team: Alba Cory, MD as PCP - General (Family Medicine) Carrie Mew, OD as Consulting Physician (Optometry) Merri Ray, MD as Consulting Physician (Physical Medicine and Rehabilitation) Ronney Asters, Jackelyn Poling, RPH-CPP as Pharmacist  Indicate any recent Medical Services you may have received from other than [facility] providers in the past year (date may be approximate).     Assessment:   This is a routine wellness examination for Jesselyn.  Hearing/Vision screen Hearing Screening - Comments:: Adequate hearing Vision Screening - Comments:: Adequate vision w/glasses Dr Lew Dawes  Dietary issues and exercise activities discussed:     Goals Addressed             This Visit's Progress    Monitor and Manage My Blood Sugar-Diabetes Type 2   On track    Timeframe:  Long-Range Goal Priority:  High Start Date: 04/04/2021                            Expected End Date: 10/05/2023                      Follow Up within 90 days    - check blood sugar at prescribed times - check blood sugar if I feel it is too high or too low - enter blood sugar readings and medication or insulin into daily log    Why is this important?   Checking your blood sugar at home helps to keep it from getting very high or very low.  Writing the results in a diary or log helps the doctor know how to care for you.  Your blood sugar log should have the time, date and the results.  Also, write down the amount of insulin or other medicine that you take.  Other information, like what you ate, exercise done and how you were feeling, will also be helpful.     Notes:        Depression Screen    05/20/2023    3:54 PM 04/27/2023    1:44 PM 12/21/2022     2:15 PM 08/19/2022    1:20 PM 07/07/2022    1:04 PM 06/25/2022    9:53 AM 06/18/2022    7:44 AM  PHQ 2/9 Scores  PHQ - 2 Score 0 0 0 0 0 0 0  PHQ- 9 Score  0 0 2 3 2 2     Fall Risk    05/16/2023    1:40 PM 04/27/2023    1:44 PM 12/21/2022    2:06 PM 08/19/2022    1:19 PM 08/12/2022    2:42 PM  Fall Risk   Falls in the past year? 0 0 0 0 0  Number falls in past yr:     0  Injury with Fall? 0    0  Risk for fall due to : No Fall Risks No Fall Risks Impaired balance/gait No Fall Risks Impaired mobility  Follow up Education provided;Falls prevention discussed Falls prevention discussed Falls prevention discussed;Education provided;Falls evaluation completed Falls prevention discussed;Education provided;Falls evaluation completed Falls evaluation completed    MEDICARE RISK AT HOME:   TIMED UP AND GO:  Was the test performed?  No  Cognitive Function:        05/20/2023    3:56 PM 06/25/2022    9:52 AM 06/20/2020    3:00 PM 06/20/2019    3:59 PM 06/17/2018   10:41 AM  6CIT Screen  What Year? 0 points 0 points 0 points 0 points 0 points  What month? 0 points 0 points 0 points 0 points 0 points  What time? 0 points 0 points 0 points 0 points 0 points  Count back from 20 0 points 0 points 0 points 0 points 0 points  Months in reverse 0 points 0 points 0 points 0 points 0 points  Repeat phrase 0 points 0 points 0 points 0 points 2 points  Total Score 0 points 0 points 0 points 0 points 2 points    Immunizations Immunization History  Administered Date(s) Administered   Fluad Quad(high Dose 65+) 06/20/2020, 06/24/2021, 07/07/2022   Influenza, High Dose Seasonal PF 06/19/2015, 06/29/2016, 06/14/2017, 06/17/2018, 06/22/2019   Influenza-Unspecified 06/17/2011, 07/11/2019   Moderna SARS-COV2 Booster Vaccination 07/21/2022   PFIZER(Purple Top)SARS-COV-2 Vaccination 12/11/2019, 01/01/2020, 07/16/2020, 02/03/2021   Pfizer Covid-19 Vaccine Bivalent Booster 38yrs & up 07/03/2021   Pneumococcal  Conjugate-13 11/24/2011, 11/23/2013, 04/09/2017   Pneumococcal Polysaccharide-23 08/30/2009   RSV,unspecified 06/26/2022   Td 08/30/2009   Tdap 08/30/2009, 08/12/2020   Zoster Recombinant(Shingrix) 07/11/2019, 09/21/2019   Zoster, Live 02/12/2011    TDAP status: Up to date  Flu Vaccine status: Up to date  Pneumococcal vaccine status: Up to date  Covid-19 vaccine status: Completed vaccines  Qualifies for Shingles Vaccine? Yes   Zostavax completed Yes   Shingrix Completed?: Yes  Screening Tests Health Maintenance  Topic Date Due   COVID-19 Vaccine (6 - 2023-24 season) 09/15/2022   MAMMOGRAM  05/20/2023   INFLUENZA VACCINE  05/20/2023   Pneumonia Vaccine 34+ Years old (3 of 3 - PPSV23 or PCV20) 12/21/2023 (Originally 04/09/2022)   Diabetic kidney evaluation - eGFR measurement  08/20/2023   Diabetic kidney evaluation - Urine ACR  08/20/2023   FOOT EXAM  08/20/2023   OPHTHALMOLOGY EXAM  10/07/2023   HEMOGLOBIN A1C  10/28/2023   Medicare Annual Wellness (AWV)  05/19/2024   Colonoscopy  01/20/2026   DTaP/Tdap/Td (4 - Td or Tdap) 08/12/2030   DEXA SCAN  Completed   Hepatitis C Screening  Completed   Zoster Vaccines- Shingrix  Completed   HPV VACCINES  Aged Out    Health Maintenance  Health Maintenance Due  Topic Date Due   COVID-19 Vaccine (6 - 2023-24 season) 09/15/2022   MAMMOGRAM  05/20/2023   INFLUENZA VACCINE  05/20/2023    Colorectal cancer screening: No longer required.   Mammogram status: No longer required due to age.  Bone Density status: Completed yes. Results reflect: Bone density results: NORMAL. Repeat every 5 years.  Lung Cancer Screening: (Low Dose CT Chest recommended if Age 32-80 years, 20 pack-year currently smoking OR have quit w/in 15years.) does not qualify.   Lung Cancer Screening Referral: yes  Additional Screening:  Hepatitis C Screening: does not qualify; Completed yes  Vision Screening: Recommended annual ophthalmology exams for early  detection of glaucoma and other disorders of the eye. Is the patient up to date with their annual eye exam?  Yes  Who is the provider or what is the name of the office in which the patient attends annual eye exams? Dr Lew Dawes If pt is not established with a provider, would they like to be referred to a provider to establish care? No .  Dental Screening: Recommended annual dental exams for proper oral hygiene  Diabetic Foot Exam: Diabetic Foot Exam: Completed yes  Community Resource Referral / Chronic Care Management: CRR required this visit?  No   CCM required this visit?  No    Plan:     I have personally reviewed and noted the following in the patient's chart:   Medical and social history Use of alcohol, tobacco or illicit drugs  Current medications and supplements including opioid prescriptions. Patient is not currently taking opioid prescriptions. Functional ability and status Nutritional status Physical activity Advanced directives List of other physicians Hospitalizations, surgeries, and ER visits in previous 12 months Vitals Screenings to include cognitive, depression, and falls Referrals and appointments  In addition, I have reviewed and discussed with patient certain preventive protocols, quality metrics, and best practice recommendations. A written personalized care plan for preventive services as well as general preventive health recommendations were provided to patient.    Sue Lush, LPN   [id number]   After Visit Summary: (MyChart) Due to this being a telephonic visit, the after visit summary with patients personalized plan was offered to patient via MyChart   Nurse Notes: The patient states she is doing well and has no concerns or questions at this time.

## 2023-05-20 NOTE — Patient Instructions (Addendum)
[name] , Thank you for taking time to come for your Medicare Wellness Visit. I appreciate your ongoing commitment to your health goals. Please review the following plan we discussed and let me know if I can assist you in the future.   Referrals/Orders/Follow-Ups/Clinician Recommendations: none  This is a list of the screening recommended for you and due dates:  Health Maintenance  Topic Date Due   COVID-19 Vaccine (6 - 2023-24 season) 09/15/2022   Mammogram  05/20/2023   Flu Shot  05/20/2023   Pneumonia Vaccine (3 of 3 - PPSV23 or PCV20) 12/21/2023*   Yearly kidney function blood test for diabetes  08/20/2023   Yearly kidney health urinalysis for diabetes  08/20/2023   Complete foot exam   08/20/2023   Eye exam for diabetics  10/07/2023   Hemoglobin A1C  10/28/2023   Medicare Annual Wellness Visit  05/19/2024   Colon Cancer Screening  01/20/2026   DTaP/Tdap/Td vaccine (4 - Td or Tdap) 08/12/2030   DEXA scan (bone density measurement)  Completed   Hepatitis C Screening  Completed   Zoster (Shingles) Vaccine  Completed   HPV Vaccine  Aged Out  *Topic was postponed. The date shown is not the original due date.    Advanced directives: (In Chart) A copy of your advanced directives are scanned into your chart should your provider ever need it.  Next Medicare Annual Wellness Visit scheduled for next year: Yes 05/25/24 @ 3:30pm telephone  Preventive Care 65 Years and Older, Female Preventive care refers to lifestyle choices and visits with your health care provider that can promote health and wellness. What does preventive care include? A yearly physical exam. This is also called an annual well check. Dental exams once or twice a year. Routine eye exams. Ask your health care provider how often you should have your eyes checked. Personal lifestyle choices, including: Daily care of your teeth and gums. Regular physical activity. Eating a healthy diet. Avoiding tobacco and drug  use. Limiting alcohol use. Practicing safe sex. Taking low-dose aspirin every day. Taking vitamin and mineral supplements as recommended by your health care provider. What happens during an annual well check? The services and screenings done by your health care provider during your annual well check will depend on your age, overall health, lifestyle risk factors, and family history of disease. Counseling  Your health care provider may ask you questions about your: Alcohol use. Tobacco use. Drug use. Emotional well-being. Home and relationship well-being. Sexual activity. Eating habits. History of falls. Memory and ability to understand (cognition). Work and work Astronomer. Reproductive health. Screening  You may have the following tests or measurements: Height, weight, and BMI. Blood pressure. Lipid and cholesterol levels. These may be checked every 5 years, or more frequently if you are over 76 years old. Skin check. Lung cancer screening. You may have this screening every year starting at age 76 if you have a 30-pack-year history of smoking and currently smoke or have quit within the past 15 years. Fecal occult blood test (FOBT) of the stool. You may have this test every year starting at age 76. Flexible sigmoidoscopy or colonoscopy. You may have a sigmoidoscopy every 5 years or a colonoscopy every 10 years starting at age 76. Hepatitis C blood test. Hepatitis B blood test. Sexually transmitted disease (STD) testing. Diabetes screening. This is done by checking your blood sugar (glucose) after you have not eaten for a while (fasting). You may have this done every 1-3 years. Bone  density scan. This is done to screen for osteoporosis. You may have this done starting at age 76. Mammogram. This may be done every 1-2 years. Talk to your health care provider about how often you should have regular mammograms. Talk with your health care provider about your test results, treatment  options, and if necessary, the need for more tests. Vaccines  Your health care provider may recommend certain vaccines, such as: Influenza vaccine. This is recommended every year. Tetanus, diphtheria, and acellular pertussis (Tdap, Td) vaccine. You may need a Td booster every 10 years. Zoster vaccine. You may need this after age 76. Pneumococcal 13-valent conjugate (PCV13) vaccine. One dose is recommended after age 76. Pneumococcal polysaccharide (PPSV23) vaccine. One dose is recommended after age 76. Talk to your health care provider about which screenings and vaccines you need and how often you need them. This information is not intended to replace advice given to you by your health care provider. Make sure you discuss any questions you have with your health care provider. Document Released: 11/01/2015 Document Revised: 06/24/2016 Document Reviewed: 08/06/2015 Elsevier Interactive Patient Education  2017 [employer].  Fall Prevention in the Home Falls can cause injuries. They can happen to people of all ages. There are many things you can do to make your home safe and to help prevent falls. What can I do on the outside of my home? Regularly fix the edges of walkways and driveways and fix any cracks. Remove anything that might make you trip as you walk through a door, such as a raised step or threshold. Trim any bushes or trees on the path to your home. Use bright outdoor lighting. Clear any walking paths of anything that might make someone trip, such as rocks or tools. Regularly check to see if handrails are loose or broken. Make sure that both sides of any steps have handrails. Any raised decks and porches should have guardrails on the edges. Have any leaves, snow, or ice cleared regularly. Use sand or salt on walking paths during winter. Clean up any spills in your garage right away. This includes oil or grease spills. What can I do in the bathroom? Use night lights. Install grab  bars by the toilet and in the tub and shower. Do not use towel bars as grab bars. Use non-skid mats or decals in the tub or shower. If you need to sit down in the shower, use a plastic, non-slip stool. Keep the floor dry. Clean up any water that spills on the floor as soon as it happens. Remove soap buildup in the tub or shower regularly. Attach bath mats securely with double-sided non-slip rug tape. Do not have throw rugs and other things on the floor that can make you trip. What can I do in the bedroom? Use night lights. Make sure that you have a light by your bed that is easy to reach. Do not use any sheets or blankets that are too big for your bed. They should not hang down onto the floor. Have a firm chair that has side arms. You can use this for support while you get dressed. Do not have throw rugs and other things on the floor that can make you trip. What can I do in the kitchen? Clean up any spills right away. Avoid walking on wet floors. Keep items that you use a lot in easy-to-reach places. If you need to reach something above you, use a strong step stool that has a grab bar. Keep  electrical cords out of the way. Do not use floor polish or wax that makes floors slippery. If you must use wax, use non-skid floor wax. Do not have throw rugs and other things on the floor that can make you trip. What can I do with my stairs? Do not leave any items on the stairs. Make sure that there are handrails on both sides of the stairs and use them. Fix handrails that are broken or loose. Make sure that handrails are as long as the stairways. Check any carpeting to make sure that it is firmly attached to the stairs. Fix any carpet that is loose or worn. Avoid having throw rugs at the top or bottom of the stairs. If you do have throw rugs, attach them to the floor with carpet tape. Make sure that you have a light switch at the top of the stairs and the bottom of the stairs. If you do not have them,  ask someone to add them for you. What else can I do to help prevent falls? Wear shoes that: Do not have high heels. Have rubber bottoms. Are comfortable and fit you well. Are closed at the toe. Do not wear sandals. If you use a stepladder: Make sure that it is fully opened. Do not climb a closed stepladder. Make sure that both sides of the stepladder are locked into place. Ask someone to hold it for you, if possible. Clearly mark and make sure that you can see: Any grab bars or handrails. First and last steps. Where the edge of each step is. Use tools that help you move around (mobility aids) if they are needed. These include: Canes. Walkers. Scooters. Crutches. Turn on the lights when you go into a dark area. Replace any light bulbs as soon as they burn out. Set up your furniture so you have a clear path. Avoid moving your furniture around. If any of your floors are uneven, fix them. If there are any pets around you, be aware of where they are. Review your medicines with your doctor. Some medicines can make you feel dizzy. This can increase your chance of falling. Ask your doctor what other things that you can do to help prevent falls. This information is not intended to replace advice given to you by your health care provider. Make sure you discuss any questions you have with your health care provider. Document Released: 08/01/2009 Document Revised: 03/12/2016 Document Reviewed: 11/09/2014 Elsevier Interactive Patient Education  2017 [employer].

## 2023-05-30 DIAGNOSIS — I10 Essential (primary) hypertension: Secondary | ICD-10-CM | POA: Diagnosis not present

## 2023-05-30 DIAGNOSIS — G4733 Obstructive sleep apnea (adult) (pediatric): Secondary | ICD-10-CM | POA: Diagnosis not present

## 2023-05-30 DIAGNOSIS — F5101 Primary insomnia: Secondary | ICD-10-CM | POA: Diagnosis not present

## 2023-05-31 DIAGNOSIS — M48062 Spinal stenosis, lumbar region with neurogenic claudication: Secondary | ICD-10-CM | POA: Diagnosis not present

## 2023-05-31 DIAGNOSIS — M5136 Other intervertebral disc degeneration, lumbar region: Secondary | ICD-10-CM | POA: Diagnosis not present

## 2023-05-31 DIAGNOSIS — M549 Dorsalgia, unspecified: Secondary | ICD-10-CM | POA: Diagnosis not present

## 2023-05-31 DIAGNOSIS — M5416 Radiculopathy, lumbar region: Secondary | ICD-10-CM | POA: Diagnosis not present

## 2023-06-17 DIAGNOSIS — M7062 Trochanteric bursitis, left hip: Secondary | ICD-10-CM | POA: Diagnosis not present

## 2023-06-17 DIAGNOSIS — J449 Chronic obstructive pulmonary disease, unspecified: Secondary | ICD-10-CM | POA: Diagnosis not present

## 2023-06-20 DIAGNOSIS — E119 Type 2 diabetes mellitus without complications: Secondary | ICD-10-CM | POA: Diagnosis not present

## 2023-06-30 DIAGNOSIS — F5101 Primary insomnia: Secondary | ICD-10-CM | POA: Diagnosis not present

## 2023-06-30 DIAGNOSIS — I10 Essential (primary) hypertension: Secondary | ICD-10-CM | POA: Diagnosis not present

## 2023-06-30 DIAGNOSIS — G4733 Obstructive sleep apnea (adult) (pediatric): Secondary | ICD-10-CM | POA: Diagnosis not present

## 2023-07-08 DIAGNOSIS — M5136 Other intervertebral disc degeneration, lumbar region: Secondary | ICD-10-CM | POA: Diagnosis not present

## 2023-07-08 DIAGNOSIS — M48062 Spinal stenosis, lumbar region with neurogenic claudication: Secondary | ICD-10-CM | POA: Diagnosis not present

## 2023-07-08 DIAGNOSIS — M5030 Other cervical disc degeneration, unspecified cervical region: Secondary | ICD-10-CM | POA: Diagnosis not present

## 2023-07-08 DIAGNOSIS — M5416 Radiculopathy, lumbar region: Secondary | ICD-10-CM | POA: Diagnosis not present

## 2023-07-13 DIAGNOSIS — M542 Cervicalgia: Secondary | ICD-10-CM | POA: Diagnosis not present

## 2023-07-16 ENCOUNTER — Encounter: Payer: Medicare Other | Admitting: Family Medicine

## 2023-07-18 DIAGNOSIS — J449 Chronic obstructive pulmonary disease, unspecified: Secondary | ICD-10-CM | POA: Diagnosis not present

## 2023-07-19 DIAGNOSIS — M542 Cervicalgia: Secondary | ICD-10-CM | POA: Diagnosis not present

## 2023-07-20 DIAGNOSIS — E119 Type 2 diabetes mellitus without complications: Secondary | ICD-10-CM | POA: Diagnosis not present

## 2023-07-27 NOTE — Progress Notes (Signed)
 Name: Amber Avery   MRN: [id number]    DOB: 1946-02-19   Date:07/28/2023       Progress Note  Subjective  Chief Complaint  Annual Exam  HPI  Patient presents for annual CPE.  Diet: she stopped snacking all the time  Exercise:  she had some PT and is trying to do the home exercises  Last Eye Exam: up to date  Last Dental Exam: up to date   Flowsheet Row Clinical Support from 06/24/2021 in [facility]  AUDIT-C Score 0      Depression: Phq 9 is  negative    07/28/2023   10:22 AM 05/20/2023    3:54 PM 04/27/2023    1:44 PM 12/21/2022    2:15 PM 08/19/2022    1:20 PM  Depression screen PHQ 2/9  Decreased Interest 0 0 0 0 0  Down, Depressed, Hopeless 0 0 0 0 0  PHQ - 2 Score 0 0 0 0 0  Altered sleeping 0  0 0 1  Tired, decreased energy 0  0 0 1  Change in appetite 0  0 0 0  Feeling bad or failure about yourself  0  0 0 0  Trouble concentrating 0  0 0 0  Moving slowly or fidgety/restless 0  0 0 0  Suicidal thoughts 0  0 0 0  PHQ-9 Score 0  0 0 2   Hypertension: BP Readings from Last 3 Encounters:  07/28/23 118/68  05/20/23 (!) 141/82  04/27/23 120/70   Obesity: Wt Readings from Last 3 Encounters:  07/28/23 255 lb (115.7 kg)  05/20/23 262 lb (118.8 kg)  04/27/23 262 lb 6.4 oz (119 kg)   BMI Readings from Last 3 Encounters:  07/28/23 48.18 kg/m  05/20/23 51.17 kg/m  04/27/23 49.58 kg/m     Vaccines:   RSV: up to date  Tdap: up to date Shingrix: up to date Pneumonia: PCV 20 today  Flu: today  COVID-19: up to date   Hep C Screening: 07/14/12 STD testing and prevention (HIV/chl/gon/syphilis): N/A Intimate partner violence: negative screen  Sexual History : not sexually active  Menstrual History/LMP/Abnormal Bleeding: seen by gyn , no more episodes of post menopausal bleeding since last year  Discussed importance of follow up if any post-menopausal bleeding: yes  Incontinence Symptoms: stable symptoms, takes medication   Breast  cancer:  - Last Mammogram: 05/19/22, due- ordered   Osteoporosis Prevention : Discussed high calcium and vitamin D supplementation, weight bearing exercises Bone density: 05/19/22  Cervical cancer screening: N/A  Skin cancer: Discussed monitoring for atypical lesions  Colorectal cancer: 01/21/16  - patient no longer wants to have it done  Lung cancer:  Low Dose CT Chest recommended if Age 61-80 years, 20 pack-year currently smoking OR have quit w/in 15years. Patient does not qualify for screen   ECG: 03/18/11  Advanced Care Planning: A voluntary discussion about advance care planning including the explanation and discussion of advance directives.  Discussed health care proxy and Living will, and the patient was able to identify a health care proxy as Frazier Butt  .  Patient has a living will and power of attorney of health care   Lipids: Lab Results  Component Value Date   CHOL 137 08/19/2022   CHOL 158 12/17/2021   CHOL 133 12/13/2020   Lab Results  Component Value Date   HDL 55 08/19/2022   HDL 53 12/17/2021   HDL 52 12/13/2020   Lab Results  Component Value Date   LDLCALC 59 08/19/2022   LDLCALC 83 12/17/2021   LDLCALC 60 12/13/2020   Lab Results  Component Value Date   TRIG 147 08/19/2022   TRIG 128 12/17/2021   TRIG 130 12/13/2020   Lab Results  Component Value Date   CHOLHDL 2.5 08/19/2022   CHOLHDL 3.0 12/17/2021   CHOLHDL 2.6 12/13/2020   No results found for: "LDLDIRECT"  Glucose: Glucose, Bld  Date Value Ref Range Status  08/19/2022 104 (H) 65 - 99 mg/dL Final    Comment:    .            Fasting reference interval . For someone without known diabetes, a glucose value between 100 and 125 mg/dL is consistent with prediabetes and should be confirmed with a follow-up test. .   12/17/2021 115 (H) 65 - 99 mg/dL Final    Comment:    .            Fasting reference interval . For someone without known diabetes, a glucose value between 100 and 125  mg/dL is consistent with prediabetes and should be confirmed with a follow-up test. .   12/13/2020 110 (H) 65 - 99 mg/dL Final    Comment:    .            Fasting reference interval . For someone without known diabetes, a glucose value between 100 and 125 mg/dL is consistent with prediabetes and should be confirmed with a follow-up test. .    Glucose-Capillary  Date Value Ref Range Status  12/21/2021 135 (H) 70 - 99 mg/dL Final    Comment:    Glucose reference range applies only to samples taken after fasting for at least 8 hours.  09/04/2020 121 (H) 70 - 99 mg/dL Final    Comment:    Glucose reference range applies only to samples taken after fasting for at least 8 hours.  09/04/2020 122 (H) 70 - 99 mg/dL Final    Comment:    Glucose reference range applies only to samples taken after fasting for at least 8 hours.    Patient Active Problem List   Diagnosis Date Noted   Greater trochanteric bursitis, left 06/18/2022   Gait instability 12/13/2020   Stage 3a chronic kidney disease (HCC) 12/13/2020   Cellulitis of right ankle 04/12/2020   Morbid obesity with BMI of 45.0-49.9, adult (HCC) 02/06/2020   History of gastritis 08/14/2019   Angiodysplasia of stomach and duodenum    Iron deficiency anemia 04/20/2018   Anemia, unspecified 04/12/2017   Lichen sclerosus of female genitalia 06/29/2016   Primary osteoarthritis of both knees 04/07/2016   Special screening for malignant neoplasms, colon    Right thyroid nodule 06/26/2015   Asthma, intermittent 04/03/2015   Benign essential HTN 04/03/2015   Carpal tunnel syndrome 04/03/2015   Chronic kidney disease (CKD), stage II (mild) 04/03/2015   Controlled gout 04/03/2015   Arteriosclerosis of coronary artery 04/03/2015   Diabetes mellitus with renal manifestations, controlled (HCC) 04/03/2015   Dyslipidemia 04/03/2015   Edema of extremities 04/03/2015   Elevated sedimentation rate 04/03/2015   Knee pain 04/03/2015   Lumbar  radiculitis 04/03/2015   Obstructive apnea 04/03/2015   Lumbosacral spondylosis 04/03/2015   Osteoarthritis, chronic 04/03/2015   Psoriasis 04/03/2015   Vitamin D deficiency 04/03/2015   Varicose veins 04/03/2015   Goiter 12/23/2014   Degeneration of intervertebral disc of lumbar region 08/23/2014   Corns and callosity 06/11/2009    Past Surgical History:  Procedure Laterality Date   APPENDECTOMY     CATARACT EXTRACTION W/PHACO Left 08/14/2020   Procedure: CATARACT EXTRACTION PHACO AND INTRAOCULAR LENS PLACEMENT (IOC) LEFT DIABETIC 3.56 00:59.6 6.0%;  Surgeon: Lockie Mola, MD;  Location: [facility];  Service: Ophthalmology;  Laterality: Left;  Diabetic   CATARACT EXTRACTION W/PHACO Right 09/04/2020   Procedure: CATARACT EXTRACTION PHACO AND INTRAOCULAR LENS PLACEMENT (IOC) RIGHT DIABETIC;  Surgeon: Lockie Mola, MD;  Location: [facility];  Service: Ophthalmology;  Laterality: Right;  4.72 1:03.2 7.4%   CHOLECYSTECTOMY  1977   COLONOSCOPY WITH PROPOFOL N/A 01/21/2016   Procedure: COLONOSCOPY WITH PROPOFOL;  Surgeon: Midge Minium, MD;  Location: [facility];  Service: Endoscopy;  Laterality: N/A;   DILATION AND CURETTAGE OF UTERUS     Due to Amenorrhea   ESOPHAGOGASTRODUODENOSCOPY (EGD) WITH PROPOFOL N/A 08/16/2018   Procedure: ESOPHAGOGASTRODUODENOSCOPY (EGD) WITH PROPOFOL;  Surgeon: Midge Minium, MD;  Location: [facility];  Service: Endoscopy;  Laterality: N/A;   HEMORRHOID BANDING  1980   HEMORROIDECTOMY     HERNIA REPAIR  2011   Temporal Area Excision Biopsy     For Birth Mark Changes, Negative Pathology   TONSILLECTOMY AND ADENOIDECTOMY      Family History  Problem Relation Age of Onset   Cancer Mother        brain tumor   Kidney disease Mother    Hypertension Mother    Heart disease Father    COPD Father    Hypertension Sister    Arthritis Sister    Cancer Sister    Heart murmur Sister    GER disease Sister    Lupus Sister     Diabetes Sister    Arthritis Sister    Osteopenia Sister    Heart disease Sister    Hypertension Sister    Breast cancer Maternal Aunt 26   Leukemia Maternal Aunt     Social History   Socioeconomic History   Marital status: Divorced    Spouse name: Not on file   Number of children: 0   Years of education: Not on file   Highest education level: GED or equivalent  Occupational History   Occupation: Retired  Tobacco Use   Smoking status: Former    Current packs/day: 0.00    Average packs/day: 2.0 packs/day for 15.0 years (30.0 ttl pk-yrs)    Types: Cigarettes    Start date: 68    Quit date: 1983    Years since quitting: 41.8   Smokeless tobacco: Never   Tobacco comments:    smoking cessation materials not required  Vaping Use   Vaping status: Never Used  Substance and Sexual Activity   Alcohol use: Not Currently    Alcohol/week: 0.0 standard drinks of alcohol   Drug use: No   Sexual activity: Not Currently  Other Topics Concern   Not on file  Social History Narrative   Patient just moved in with her youngest sister and a roommate    Social Determinants of Health   Financial Resource Strain: Low Risk  (05/16/2023)   Overall Financial Resource Strain (CARDIA)    Difficulty of Paying Living Expenses: Not hard at all  Food Insecurity: No Food Insecurity (05/16/2023)   Hunger Vital Sign    Worried About Running Out of Food in the Last Year: Never true    Ran Out of Food in the Last Year: Never true  Transportation Needs: No Transportation Needs (05/16/2023)   PRAPARE - Transportation    Lack  of Transportation (Medical): No    Lack of Transportation (Non-Medical): No  Physical Activity: Insufficiently Active (05/16/2023)   Exercise Vital Sign    Days of Exercise per Week: 2 days    Minutes of Exercise per Session: 10 min  Stress: No Stress Concern Present (05/16/2023)   [employer] of Occupational Health - Occupational Stress Questionnaire    Feeling of  Stress : Not at all  Social Connections: Socially Isolated (05/16/2023)   Social Connection and Isolation Panel [NHANES]    Frequency of Communication with Friends and Family: Once a week    Frequency of Social Gatherings with Friends and Family: Once a week    Attends Religious Services: Never    Database administrator or Organizations: No    Attends Banker Meetings: Never    Marital Status: Divorced  Catering manager Violence: Not At Risk (05/20/2023)   Humiliation, Afraid, Rape, and Kick questionnaire    Fear of Current or Ex-Partner: No    Emotionally Abused: No    Physically Abused: No    Sexually Abused: No     Current Outpatient Medications:    acetaminophen (TYLENOL) 500 MG tablet, Take 1 tablet (500 mg total) by mouth every 6 (six) hours as needed. (Patient taking differently: Take 1,000 mg by mouth every 8 (eight) hours.), Disp: 480 tablet, Rfl: 0   albuterol (VENTOLIN HFA) 108 (90 Base) MCG/ACT inhaler, Inhale 2 puffs into the lungs every 6 (six) hours as needed for wheezing or shortness of breath., Disp: 8 g, Rfl: 5   allopurinol (ZYLOPRIM) 300 MG tablet, Take 1 tablet (300 mg total) by mouth daily., Disp: 90 tablet, Rfl: 3   atorvastatin (LIPITOR) 20 MG tablet, TAKE 1 TABLET BY MOUTH DAILY FOR CHOLESTEROL, Disp: 90 tablet, Rfl: 3   benazepril (LOTENSIN) 5 MG tablet, Take 1 tablet (5 mg total) by mouth daily., Disp: 90 tablet, Rfl: 3   Blood Pressure KIT, 1 each by Does not apply route daily., Disp: 1 kit, Rfl: 0   dapagliflozin propanediol (FARXIGA) 10 MG TABS tablet, Take 1 tablet (10 mg total) by mouth daily before breakfast., Disp: 90 tablet, Rfl: 3   diclofenac Sodium (VOLTAREN) 1 % GEL, APPLY 4 GRAMS TOPICALLY FOUR TIMES DAILY AS DIRECTED; GENERIC EQUIVALENT FOR VOLTAREN, Disp: 300 g, Rfl: 1   Dulaglutide (TRULICITY) 3 MG/0.5ML SOPN, Inject 3 mg as directed once a week., Disp: 12 mL, Rfl: 3   furosemide (LASIX) 40 MG tablet, Take 1 tablet (40 mg total) by  mouth daily., Disp: 90 tablet, Rfl: 3   gabapentin (NEURONTIN) 300 MG capsule, TAKE 1 CAPSULE BY MOUTH IN THE MORNING, 1 CAPSULE IN THE EVENING AND 2 CAPSULES AT NIGHT, Disp: 360 capsule, Rfl: 3   glucose blood (ONETOUCH VERIO) test strip, USE AS DIRECTED, Disp: 50 each, Rfl: 1   Lancets (ONETOUCH ULTRASOFT) lancets, Use as instructed, Disp: 100 each, Rfl: 12   methocarbamol (ROBAXIN) 500 MG tablet, Take 500 mg by mouth 2 (two) times daily., Disp: , Rfl:    nystatin cream (MYCOSTATIN), Apply 1 Application topically 2 (two) times daily., Disp: 60 g, Rfl: 3   oxybutynin (DITROPAN XL) 15 MG 24 hr tablet, Take 1 tablet (15 mg total) by mouth daily., Disp: 90 tablet, Rfl: 3   potassium chloride SA (KLOR-CON M) 20 MEQ tablet, TAKE 1 TABLET BY MOUTH DAILY, Disp: 90 tablet, Rfl: 3   traMADol (ULTRAM) 50 MG tablet, Take 25-50 mg by mouth 2 (two) times daily as  needed., Disp: , Rfl:    triamcinolone ointment (KENALOG) 0.1 %, APPLY TOPICALLY TWICE DAILY AS NEEDED. GENERIC EQUIVALENT FOR KENALOG., Disp: 90 g, Rfl: 0   vitamin B-12 (CYANOCOBALAMIN) 500 MCG tablet, Take 500 mcg by mouth daily., Disp: , Rfl:    Vitamin D, Cholecalciferol, 25 MCG (1000 UT) TABS, Take 1 capsule by mouth daily., Disp: 90 tablet, Rfl: 1  Allergies  Allergen Reactions   Codeine Hives and Nausea And Vomiting     ROS  Constitutional: Negative for fever , positive for weight change.  Respiratory: Negative for cough and shortness of breath.   Cardiovascular: Negative for chest pain or palpitations.  Gastrointestinal: Negative for abdominal pain, no bowel changes.  Musculoskeletal: positive  for gait problem and intermittent  joint swelling.  Skin: Negative for rash.  Neurological: Negative for dizziness or headache.  No other specific complaints in a complete review of systems (except as listed in HPI above).   Objective  Vitals:   07/28/23 1023  BP: 118/68  Pulse: 86  Resp: 16  SpO2: 98%  Weight: 255 lb (115.7 kg)   Height: 5\' 1"  (1.549 m)    Body mass index is 48.18 kg/m.  Physical Exam  Constitutional: Patient appears well-developed and well-nourished. No distress.  HENT: Head: Normocephalic and atraumatic. Ears: B TMs ok, no erythema or effusion; Nose: Nose normal. Mouth/Throat: Oropharynx is clear and moist. No oropharyngeal exudate.  Eyes: Conjunctivae and EOM are normal. Pupils are equal, round, and reactive to light. No scleral icterus.  Neck: Normal range of motion. Neck supple. No JVD present. No thyromegaly present.  Cardiovascular: Normal rate, regular rhythm and normal heart sounds.  No murmur heard. No BLE edema. Pulmonary/Chest: Effort normal and breath sounds normal. No respiratory distress. Abdominal: Soft. Bowel sounds are normal, no distension. There is no tenderness. no masses Breast: no lumps or masses, no nipple discharge or rashes FEMALE GENITALIA:  Not done  RECTAL: not done  Musculoskeletal: uses walker, slow gait  Neurological: he is alert and oriented to person, place, and time. No cranial nerve deficit. Coordination, balance, strength, speech and gait are normal.  Skin: Skin is warm and dry. No rash noted. No erythema.  Psychiatric: Patient has a normal mood and affect. behavior is normal. Judgment and thought content normal.    Fall Risk:    07/28/2023   10:22 AM 05/16/2023    1:40 PM 04/27/2023    1:44 PM 12/21/2022    2:06 PM 08/19/2022    1:19 PM  Fall Risk   Falls in the past year? 0 0 0 0 0  Number falls in past yr: 0      Injury with Fall? 0 0     Risk for fall due to : No Fall Risks No Fall Risks No Fall Risks Impaired balance/gait No Fall Risks  Follow up Falls prevention discussed Education provided;Falls prevention discussed Falls prevention discussed Falls prevention discussed;Education provided;Falls evaluation completed Falls prevention discussed;Education provided;Falls evaluation completed     Functional Status Survey: Is the patient deaf or have  difficulty hearing?: No Does the patient have difficulty seeing, even when wearing glasses/contacts?: No Does the patient have difficulty concentrating, remembering, or making decisions?: No Does the patient have difficulty walking or climbing stairs?: Yes Does the patient have difficulty dressing or bathing?: No Does the patient have difficulty doing errands alone such as visiting a doctor's office or shopping?: No   Assessment & Plan  1. Well adult exam   2.  Breast cancer screening by mammogram  - MM 3D SCREENING MAMMOGRAM BILATERAL BREAST; Future  3. Need for immunization against influenza  - Flu Vaccine Trivalent High Dose (Fluad)  4. Need for pneumococcal 20-valent conjugate vaccination  - Pneumococcal conjugate vaccine 20-valent (Prevnar 20)   -USPSTF grade A and B recommendations reviewed with patient; age-appropriate recommendations, preventive care, screening tests, etc discussed and encouraged; healthy living encouraged; see AVS for patient education given to patient -Discussed importance of 150 minutes of physical activity weekly, eat two servings of fish weekly, eat one serving of tree nuts ( cashews, pistachios, pecans, almonds.[name] Kitchen) every other day, eat 6 servings of fruit/vegetables daily and drink plenty of water and avoid sweet beverages.   -Reviewed Health Maintenance: Yes.

## 2023-07-28 ENCOUNTER — Ambulatory Visit (INDEPENDENT_AMBULATORY_CARE_PROVIDER_SITE_OTHER): Payer: Medicare Other | Admitting: Family Medicine

## 2023-07-28 ENCOUNTER — Encounter: Payer: Self-pay | Admitting: Family Medicine

## 2023-07-28 VITALS — BP 118/68 | HR 86 | Resp 16 | Ht 61.0 in | Wt 255.0 lb

## 2023-07-28 DIAGNOSIS — Z0000 Encounter for general adult medical examination without abnormal findings: Secondary | ICD-10-CM

## 2023-07-28 DIAGNOSIS — N183 Chronic kidney disease, stage 3 unspecified: Secondary | ICD-10-CM

## 2023-07-28 DIAGNOSIS — Z1231 Encounter for screening mammogram for malignant neoplasm of breast: Secondary | ICD-10-CM

## 2023-07-28 DIAGNOSIS — Z23 Encounter for immunization: Secondary | ICD-10-CM

## 2023-08-13 ENCOUNTER — Ambulatory Visit
Admission: RE | Admit: 2023-08-13 | Discharge: 2023-08-13 | Disposition: A | Discharge status: Home / Self Care | Payer: Medicare Other | Source: Ambulatory Visit | Attending: Family Medicine | Admitting: Family Medicine

## 2023-08-13 DIAGNOSIS — Z1231 Encounter for screening mammogram for malignant neoplasm of breast: Secondary | ICD-10-CM | POA: Insufficient documentation

## 2023-08-14 DIAGNOSIS — M542 Cervicalgia: Secondary | ICD-10-CM | POA: Diagnosis not present

## 2023-08-17 DIAGNOSIS — J449 Chronic obstructive pulmonary disease, unspecified: Secondary | ICD-10-CM | POA: Diagnosis not present

## 2023-08-20 DIAGNOSIS — E119 Type 2 diabetes mellitus without complications: Secondary | ICD-10-CM | POA: Diagnosis not present

## 2023-08-23 ENCOUNTER — Other Ambulatory Visit: Payer: Medicare Other | Admitting: Pharmacist

## 2023-08-23 NOTE — Progress Notes (Unsigned)
08/23/2023 Name: Amber Avery MRN: [id number] DOB: Sep 10, 1946  Chief Complaint  Patient presents with   Medication Assistance    Amber Avery is a 77 y.o. year old female who presented for a telephone visit.   They were referred to the pharmacist by their PCP for assistance in managing medication access.    Subjective:  Care Team: Primary Care Provider: Alba Cory, MD ; Next Scheduled Visit: 09/07/2023 Urologist: Hulan Fray; Next Scheduled Visit: 09/01/2023 Physical Medicine and Rehabilitation: Chanda Busing, DO ; Next Scheduled Visit: 09/21/2023  Medication Access/Adherence  Current Pharmacy:  [facility] - [place], [place] - [address] [place] [address] Phone: [phone] Fax: [phone]  [facility] - [place], [place] - [address] [place] [address] Phone: [phone] Fax: [phone]  [facility] - [place], [place] - [address] [place] [address] [place] [address] Phone: [phone] Fax: [phone]   Patient reports affordability concerns with their medications: No  Patient reports access/transportation concerns to their pharmacy: No  Patient reports adherence concerns with their medications:  No    Diabetes:  Current medications:  - Farxiga 10 mg daily - Trulicity 3 mg injection weekly   Denies checking home blood sugar recently, but confirms has glucometer and supplies  Patient denies hypoglycemic s/sx including dizziness, shakiness, sweating.  Reports has been working on cutting back on sweets and has noticed weight loss  Current physical activity: reports limited by hip pain; walks with walker or scooter - Reports has noticed the impact on her blood sugar when receives steroid injections for her hip pain  Statin therapy: atorvastatin 20 mg daily  Current medication access support: enrolled in  patient assistance for Farxiga from [employer] and for Trulicity from [employer] patient assistance program - Reports has sufficient supply of Farxiga and Trulicity to last through end of the current calendar year - Reports has not completed [employer] Writer for [employer] patient assistance re-enrollment - Note patient dropped off patient assistance forms for re-enrollment to office, which office faxed to CPhT Noreene Larsson Simcox today   Objective:  Lab Results  Component Value Date   HGBA1C 7.2 (A) 04/27/2023    Lab Results  Component Value Date   CREATININE 1.32 (H) 08/19/2022   BUN 30 (H) 08/19/2022   NA 145 08/19/2022   K 4.5 08/19/2022   CL 104 08/19/2022   CO2 32 08/19/2022    Lab Results  Component Value Date   CHOL 137 08/19/2022   HDL 55 08/19/2022   LDLCALC 59 08/19/2022   TRIG 147 08/19/2022   CHOLHDL 2.5 08/19/2022   BP Readings from Last 3 Encounters:  07/28/23 118/68  05/20/23 (!) 141/82  04/27/23 120/70   Pulse Readings from Last 3 Encounters:  07/28/23 86  04/27/23 94  12/21/22 92     Medications Reviewed Today     Reviewed by Manuela Neptune, RPH-CPP (Pharmacist) on 08/23/23 at 1626  Med List Status: <None>   Medication Order Taking? Sig Documenting Provider Last Dose Status Informant  acetaminophen (TYLENOL) 500 MG tablet [id number] Yes Take 1 tablet (500 mg total) by mouth every 6 (six) hours as needed.  Patient taking differently: Take 1,000 mg by mouth every 8 (eight) hours.   Alba Cory, MD Taking Active   albuterol (VENTOLIN HFA) 108 (90 Base) MCG/ACT inhaler [id number] Yes Inhale 2 puffs  into the lungs every 6 (six) hours as needed for wheezing or shortness of breath. Alba Cory, MD Taking Active   allopurinol (ZYLOPRIM) 300 MG tablet [id number] Yes Take 1 tablet (300 mg total) by mouth daily. Alba Cory, MD Taking Active   atorvastatin (LIPITOR) 20 MG tablet [id number] Yes TAKE 1 TABLET BY MOUTH DAILY FOR CHOLESTEROL Sowles, Danna Hefty, MD  Taking Active   benazepril (LOTENSIN) 5 MG tablet [id number] Yes Take 1 tablet (5 mg total) by mouth daily. Alba Cory, MD Taking Active   Blood Pressure KIT [id number]  1 each by Does not apply route daily. Alba Cory, MD  Active   dapagliflozin propanediol (FARXIGA) 10 MG TABS tablet [id number] Yes Take 1 tablet (10 mg total) by mouth daily before breakfast. Alba Cory, MD Taking Active   diclofenac Sodium (VOLTAREN) 1 % GEL [id number] Yes APPLY 4 GRAMS TOPICALLY FOUR TIMES DAILY AS DIRECTED; GENERIC EQUIVALENT FOR VOLTAREN Sowles, Danna Hefty, MD Taking Active   Dulaglutide (TRULICITY) 3 MG/0.5ML SOPN [id number] Yes Inject 3 mg as directed once a week. Alba Cory, MD Taking Active   furosemide (LASIX) 40 MG tablet [id number] Yes Take 1 tablet (40 mg total) by mouth daily. Alba Cory, MD Taking Active   gabapentin (NEURONTIN) 300 MG capsule [id number] Yes TAKE 1 CAPSULE BY MOUTH IN THE MORNING, 1 CAPSULE IN THE EVENING AND 2 CAPSULES AT Mittie Bodo, Danna Hefty, MD Taking Active   glucose blood [facility] VERIO) test strip [id number]  USE AS DIRECTED Alba Cory, MD  Active   Lancets [facility] ULTRASOFT) lancets [id number]  Use as instructed Alba Cory, MD  Active   methocarbamol (ROBAXIN) 500 MG tablet [id number] Yes Take 500 mg by mouth 2 (two) times daily. Merri Ray, MD Taking Active            Med Note Ronney Asters, [facility] A   Mon Aug 23, 2023  4:18 PM) Takes at bedtime  nystatin cream (MYCOSTATIN) [id number]  Apply 1 Application topically 2 (two) times daily. Linzie Collin, MD  Active   oxybutynin (DITROPAN XL) 15 MG 24 hr tablet [id number] Yes Take 1 tablet (15 mg total) by mouth daily. Vanna Scotland, MD Taking Active   potassium chloride SA (KLOR-CON M) 20 MEQ tablet [id number] Yes TAKE 1 TABLET BY MOUTH DAILY Sowles, Danna Hefty, MD Taking Active   traMADol (ULTRAM) 50 MG tablet [id number] Yes Take 25-50 mg by mouth 2 (two) times daily as needed. Merri Ray,  MD Taking Active            Med Note Gaspar Cola   Wed Mar 04, 2022  3:03 PM)  Prescribed by Burman Freestone, NP  triamcinolone ointment (KENALOG) 0.1 % [id number] Yes APPLY TOPICALLY TWICE DAILY AS NEEDED. GENERIC EQUIVALENT FOR KENALOG. Alba Cory, MD Taking Active   vitamin B-12 (CYANOCOBALAMIN) 500 MCG tablet [id number] Yes Take 500 mcg by mouth daily. [provider] Taking Active   Vitamin D, Cholecalciferol, 25 MCG (1000 UT) TABS [id number] Yes Take 1 capsule by mouth daily. Alba Cory, MD Taking Active               Assessment/Plan:   Comprehensive medication review performed; medication list updated in electronic medical record - Caution patient for risk of dizziness/risk of falls with gabapentin, methocarbamol and tramadol, particularly when taken in combination  Verbalizes understanding, but denies dizziness with these medications  Notes that she takes these only as needed  Reports wears Life Alert - Admits to drying with taking oxybutynin. Reports drinks water throughout the  day. Denies difficulty with swallowing Klor-Con tablet  Diabetes: - Reviewed dietary modifications including importance of having regular well-balanced meals and snacks throughout the day, while controlling carbohydrate portion sizes  Encourage to review nutrition labels for carbohydrate content of foods - Recommend to check glucose, keep log of results and have this record to review at upcoming medical appointments. Patient to contact provider office sooner if needed for readings outside of established parameters or symptoms - Will collaborate with PCP and CPhT to support patient with applying for re-enrollment for patient assistance for Farxiga from [employer] and for Trulicity from [employer] patient assistance program for 2025    Follow Up Plan: Clinical Pharmacist will follow up with patient by telephone on 11/17/2023 at 1:00 PM   Estelle Grumbles, PharmD, [facility] [phone]

## 2023-08-24 ENCOUNTER — Encounter: Payer: Self-pay | Admitting: Pharmacist

## 2023-08-24 NOTE — Patient Instructions (Signed)
Goals Addressed             This Visit's Progress    Pharmacy Goals       The goal A1c is less than 7%. This is the best way to reduce the risk of the long term complications of diabetes, including heart disease, kidney disease, eye disease, strokes, and nerve damage. An A1c of less than 7% corresponds with fasting sugars less than 130 and 2 hour after meal sugars less than 180.   Pharmacy Technician Noreene Larsson Simcox will collaborate with Dr. Carlynn Purl and submit your applications for patient assistance re-enrollment to [employer] (for Trulicity) and to [employer] (for [place]).   In the meantime, I wanted to provide you with contact information for Noreene Larsson:   [name], CPhT Phone: [phone] Fax: [phone]     If you need to reach out to patient assistance programs in the future regarding refills or to find out the status of your application, you can do so by calling:   [employer] at [phone] [employer] at [phone]     I look forward to speaking with you again by phone on 11/17/2023 at 1:00 PM.    Please give me a call if needed for medication related questions or concerns sooner.   Estelle Grumbles, PharmD, [facility] [phone]

## 2023-08-25 ENCOUNTER — Other Ambulatory Visit: Payer: Medicare Other | Admitting: Pharmacist

## 2023-08-26 ENCOUNTER — Telehealth: Payer: Self-pay | Admitting: Pharmacy Technician

## 2023-08-26 DIAGNOSIS — Z5986 Financial insecurity: Secondary | ICD-10-CM

## 2023-08-26 DIAGNOSIS — Z59868 Other specified financial insecurity: Secondary | ICD-10-CM

## 2023-08-26 NOTE — Progress Notes (Signed)
[facility])                                            [facility] Quality Pharmacy Team    08/26/2023  SAKARI RAISANEN 1946-01-03 [id number]                                      Medication Assistance Referral  Referral From:  [facility] PharmD Estelle Grumbles  Medication/Company: Marcelline Deist / AZ[employer]ME Patient application portion:  N/A patient signed in clinic Provider application portion: Faxed  to Dr. Alba Cory Provider address/fax verified via: Office website  Medication/Company: Leana Roe Patient application portion:  N/A patient signed in clinic Provider application portion: Faxed  to Dr. Alba Cory Provider address/fax verified via: Office website  [name], CPhT [place]  Office: [phone] Fax: [phone] Email: [email or web address]

## 2023-08-30 ENCOUNTER — Ambulatory Visit: Payer: Medicare Other | Admitting: Family Medicine

## 2023-08-30 NOTE — Progress Notes (Signed)
 "   09/01/2023 1:54 PM   EUDELIA Avery September 10, 1946 [id number]  Referring provider: Glenard Mire, MD [address] [place],  [place] [address]  Urological history: 1. Urge incontinence -Contributing factors of age, GSM, asthma, diabetes, hypertension, sleep apnea, history of smoking and diuretic -Oxybutynin  XL 15 mg daily  2.  Enuresis -Contributing factors of obstructive sleep apnea, hypertension, diabetes and asthma  Chief Complaint  Patient presents with   Follow-up   HPI: Amber Avery is a 77 y.o. female who presents today for one year follow up.   Previous records reviewed.   She is going to the restroom every hour while awake, 3 more times during the night.  With a strong urge to urinate.  She wears 1 absorbent pad daily.  She does not limit fluid intake.  She does engage in toilet mapping.  Patient denies any modifying or aggravating factors.  Patient denies any recent UTI's, gross hematuria, dysuria or suprapubic/flank pain.  Patient denies any fevers, chills, nausea or vomiting.    PVR 0 mL   She feels that the Ditropan  does not help her urinary symptoms at all but just gives her dry mouth and she wants to stop the medication.  PMH: Past Medical History:  Diagnosis Date   Asthma    Diabetes mellitus without complication (HCC)    Gout    Hyperlipidemia    Hypertension    Iron  deficiency anemia 04/20/2018   PONV (postoperative nausea and vomiting)    after hemorroid surgery   Shortness of breath dyspnea    Sleep apnea     Surgical History: Past Surgical History:  Procedure Laterality Date   APPENDECTOMY     CATARACT EXTRACTION W/PHACO Left 08/14/2020   Procedure: CATARACT EXTRACTION PHACO AND INTRAOCULAR LENS PLACEMENT (IOC) LEFT DIABETIC 3.56 00:59.6 6.0%;  Surgeon: Mittie Gaskin, MD;  Location: [facility];  Service: Ophthalmology;  Laterality: Left;  Diabetic   CATARACT EXTRACTION W/PHACO Right 09/04/2020   Procedure:  CATARACT EXTRACTION PHACO AND INTRAOCULAR LENS PLACEMENT (IOC) RIGHT DIABETIC;  Surgeon: Mittie Gaskin, MD;  Location: [facility];  Service: Ophthalmology;  Laterality: Right;  4.72 1:03.2 7.4%   CHOLECYSTECTOMY  1977   COLONOSCOPY WITH PROPOFOL  N/A 01/21/2016   Procedure: COLONOSCOPY WITH PROPOFOL ;  Surgeon: Rogelia Copping, MD;  Location: [facility];  Service: Endoscopy;  Laterality: N/A;   DILATION AND CURETTAGE OF UTERUS     Due to Amenorrhea   ESOPHAGOGASTRODUODENOSCOPY (EGD) WITH PROPOFOL  N/A 08/16/2018   Procedure: ESOPHAGOGASTRODUODENOSCOPY (EGD) WITH PROPOFOL ;  Surgeon: Copping Rogelia, MD;  Location: [facility];  Service: Endoscopy;  Laterality: N/A;   HEMORRHOID BANDING  1980   HEMORROIDECTOMY     HERNIA REPAIR  2011   Temporal Area Excision Biopsy     For Birth Mark Changes, Negative Pathology   TONSILLECTOMY AND ADENOIDECTOMY      Home Medications:  Allergies as of 09/01/2023       Reactions   Codeine Hives, Nausea And Vomiting        Medication List        Accurate as of September 01, 2023  1:54 PM. If you have any questions, ask your nurse or doctor.          acetaminophen  500 MG tablet Commonly known as: TYLENOL  Take 1 tablet (500 mg total) by mouth every 6 (six) hours as needed. What changed:  how much to take when to take this   albuterol  108 (90 Base) MCG/ACT inhaler Commonly  known as: VENTOLIN  HFA Inhale 2 puffs into the lungs every 6 (six) hours as needed for wheezing or shortness of breath.   allopurinol  300 MG tablet Commonly known as: ZYLOPRIM  Take 1 tablet (300 mg total) by mouth daily.   atorvastatin  20 MG tablet Commonly known as: LIPITOR TAKE 1 TABLET BY MOUTH DAILY FOR CHOLESTEROL   benazepril  5 MG tablet Commonly known as: LOTENSIN  Take 1 tablet (5 mg total) by mouth daily.   Blood Pressure Kit 1 each by Does not apply route daily.   dapagliflozin  propanediol 10 MG Tabs tablet Commonly known as: Farxiga  Take  1 tablet (10 mg total) by mouth daily before breakfast.   diclofenac  Sodium 1 % Gel Commonly known as: VOLTAREN  APPLY 4 GRAMS TOPICALLY FOUR TIMES DAILY AS DIRECTED; GENERIC EQUIVALENT FOR VOLTAREN    furosemide  40 MG tablet Commonly known as: LASIX  Take 1 tablet (40 mg total) by mouth daily.   gabapentin  300 MG capsule Commonly known as: NEURONTIN  TAKE 1 CAPSULE BY MOUTH IN THE MORNING, 1 CAPSULE IN THE EVENING AND 2 CAPSULES AT NIGHT   methocarbamol  500 MG tablet Commonly known as: ROBAXIN  Take 500 mg by mouth 2 (two) times daily.   nystatin  cream Commonly known as: MYCOSTATIN  Apply 1 Application topically 2 (two) times daily.   onetouch ultrasoft lancets Use as instructed   OneTouch Verio test strip Generic drug: glucose blood USE AS DIRECTED   oxybutynin  15 MG 24 hr tablet Commonly known as: DITROPAN  XL Take 1 tablet (15 mg total) by mouth daily.   potassium chloride  SA 20 MEQ tablet Commonly known as: KLOR-CON  M TAKE 1 TABLET BY MOUTH DAILY   traMADol  50 MG tablet Commonly known as: ULTRAM  Take 25-50 mg by mouth 2 (two) times daily as needed.   triamcinolone  ointment 0.1 % Commonly known as: KENALOG  APPLY TOPICALLY TWICE DAILY AS NEEDED. GENERIC EQUIVALENT FOR KENALOG .   Trulicity  3 MG/0.5ML Soaj Generic drug: Dulaglutide  Inject 3 mg as directed once a week.   vitamin B-12 500 MCG tablet Commonly known as: CYANOCOBALAMIN  Take 500 mcg by mouth daily.   Vitamin D  (Cholecalciferol ) 25 MCG (1000 UT) Tabs Take 1 capsule by mouth daily.        Allergies:  Allergies  Allergen Reactions   Codeine Hives and Nausea And Vomiting    Family History: Family History  Problem Relation Age of Onset   Cancer Mother        brain tumor   Kidney disease Mother    Hypertension Mother    Heart disease Father    COPD Father    Hypertension Sister    Arthritis Sister    Cancer Sister    Heart murmur Sister    GER disease Sister    Lupus Sister    Diabetes  Sister    Arthritis Sister    Osteopenia Sister    Heart disease Sister    Hypertension Sister    Breast cancer Maternal Aunt 25   Leukemia Maternal Aunt     Social History:  reports that she quit smoking about 41 years ago. Her smoking use included cigarettes. She started smoking about 56 years ago. She has a 30 pack-year smoking history. She has never used smokeless tobacco. She reports that she does not currently use alcohol . She reports that she does not use drugs.  ROS: Pertinent ROS in HPI  Physical Exam: BP 96/63   Pulse 75   Constitutional:  Well nourished. Alert and oriented, No acute distress. HEENT:  [place] AT, moist mucus membranes.  Trachea midline Cardiovascular: No clubbing, cyanosis, or edema. Respiratory: Normal respiratory effort, no increased work of breathing. Neurologic: Grossly intact, no focal deficits, moving all 4 extremities. Psychiatric: Normal mood and affect.    Laboratory Data: Component     Latest Ref Rng 04/27/2023  Hemoglobin A1C     4.0 - 5.6 % 7.2 !   HbA1c, POC (controlled diabetic range)     0.0 - 7.0 %     Legend: ! Abnormal I have reviewed the labs.   Pertinent Imaging:  09/01/23 13:22  Scan Result 0 ml    Assessment & Plan:    1. Urge incontinence -Not at goal and having intolerable side effects from the oxybutynin  -We discussed trying either Myrbetriq or Gemtesa as they are newer medications and work differently than oxybutynin , but I did advise her that they are likely not to be covered by her insurance, she deferred -We also discussed trying another medication in the anticholinergic family to see if it would control her urinary symptoms better and give her less of dry mouth, she deferred -It will be difficult to get her completely dry with her concurrent use of Lasix  and Farxiga  -She would just like to have a drug holiday for 3 months and then regroup  2.  Enuresis -She is compliant with the CPAP -Not at goal with  oxybutynin   Return in about 3 months (around 12/02/2023) for PVR and OAB questionnaire.  These notes generated with voice recognition software. I apologize for typographical errors.  CLOTILDA HELON RIGGERS  [facility] [address]  [address] [place], [place] [address] [phone]  "

## 2023-09-01 ENCOUNTER — Ambulatory Visit: Payer: Medicare Other | Admitting: Urology

## 2023-09-01 ENCOUNTER — Encounter: Payer: Self-pay | Admitting: Urology

## 2023-09-01 VITALS — BP 96/63 | HR 75

## 2023-09-01 DIAGNOSIS — N3941 Urge incontinence: Secondary | ICD-10-CM

## 2023-09-01 DIAGNOSIS — R32 Unspecified urinary incontinence: Secondary | ICD-10-CM

## 2023-09-01 LAB — BLADDER SCAN AMB NON-IMAGING: Scan Result: 0

## 2023-09-06 NOTE — Progress Notes (Unsigned)
Name: Amber Avery   MRN: [id number]    DOB: 02/16/1946   Date:09/07/2023       Progress Note  Subjective  Chief Complaint  Follow Up  HPI  HTN:   She is now on benazepril 5 mg. No chest pain, palpitation, dizziness  or change in exercise tolerance. BP is low, advised to cut down on furosemide - take it only prn from now on   Dyslipidemia: taking statin therapy, no side effects, no chest pain or myalgia. Last LDL was back to normal at 59 . We will recheck it today    Asthma Mild intermittent: she states she has been doing well, no wheezing, cough , mild SOB with activity but likely multifactorial. She had some nasal congestion this morning but doing better now   DMII: She is on Farxiga and Trulicity 3 mg through assistance program , A1C was  down from 8.8 % to 7.2 % today is up to 7.4 %. She denies  polyphagia, polyuria (stable because of diuretic) denies  polydipsia . She has dyslipidemia and last LDL was at goal, she has   CKI stage III she is on low dose ACE but last urine micro elevated but is now taking  SGL-2 agonist and we will recheck it today. She also  takes gabapentin for neuropathy. We will resume Metformin 750 mg daily she has been improving her diet over the past month   Morbid obesity: BMI over 40 ,weight is down 10 lbs since July 2024. She is changing her diet and also on Trulicity     OSA: she wear machine every night, she uses oxygen with CPAP and is compliant. Stable     OA : she saw Ortho , Dr. Landry Mellow, and he advised her not to have surgery because of her weight, she had one round of hyaluronic injection without help and it was too expensive . She is still taking Tylenol She still has intermittent right knee effusion when more active, no redness or increase in warmth. She has a hoveround, uses walker and sometimes cane.     Iron deficiency anemia: last level was better, under the care of Dr. Cathie Hoops and had 5 iron infusions in the past but CBC has been back to normal, last  ferritin was normal at 52 done 08/2022, last visit with GI was with Dr. Servando Snare - GI in 2020 . We will recheck labs today   Post-menopausal bleeding : she had polyp removal and also endometrial biopsy done end of October 23 no longer having spotting or pain . She asked me to fill Nystatin but advised her to go back to GYN for an exam       Senile purpura: stable and reassurance given    DDD lumbar spine and radiculitis: seen by Psyatrist , seeing Whitney FNP, she has MRI lumbar spine, steroid injection for trochanteric bursitis did not work, she also had PT . She is still taking Gabapentin and takes Tramadol every night but only prn during the day when the pain is intense, she also takes Methocarbamol also.  She states pain is worse in the mornings. No longer has daily pain She has a visit coming up with Dr. Leeanne Rio   B12 deficiency: taking supplements , last level at goal. We will recheck   Controlled gout: taking allopurinol , uric acid has been at goal, no recent episodes .   Patient Active Problem List   Diagnosis Date Noted   Greater trochanteric bursitis, left 06/18/2022  Gait instability 12/13/2020   Stage 3a chronic kidney disease (HCC) 12/13/2020   Cellulitis of right ankle 04/12/2020   Morbid obesity with BMI of 45.0-49.9, adult (HCC) 02/06/2020   History of gastritis 08/14/2019   Angiodysplasia of stomach and duodenum    Iron deficiency anemia 04/20/2018   Anemia, unspecified 04/12/2017   Lichen sclerosus of female genitalia 06/29/2016   Primary osteoarthritis of both knees 04/07/2016   Special screening for malignant neoplasms, colon    Right thyroid nodule 06/26/2015   Asthma, intermittent 04/03/2015   Benign essential HTN 04/03/2015   Carpal tunnel syndrome 04/03/2015   Chronic kidney disease (CKD), stage II (mild) 04/03/2015   Controlled gout 04/03/2015   Arteriosclerosis of coronary artery 04/03/2015   Diabetes mellitus with renal manifestations, controlled (HCC)  04/03/2015   Dyslipidemia 04/03/2015   Edema of extremities 04/03/2015   Elevated sedimentation rate 04/03/2015   Knee pain 04/03/2015   Lumbar radiculitis 04/03/2015   Obstructive apnea 04/03/2015   Lumbosacral spondylosis 04/03/2015   Osteoarthritis, chronic 04/03/2015   Psoriasis 04/03/2015   Vitamin D deficiency 04/03/2015   Varicose veins 04/03/2015   Goiter 12/23/2014   Degeneration of intervertebral disc of lumbar region 08/23/2014   Corns and callosity 06/11/2009    Past Surgical History:  Procedure Laterality Date   APPENDECTOMY     CATARACT EXTRACTION W/PHACO Left 08/14/2020   Procedure: CATARACT EXTRACTION PHACO AND INTRAOCULAR LENS PLACEMENT (IOC) LEFT DIABETIC 3.56 00:59.6 6.0%;  Surgeon: Lockie Mola, MD;  Location: [facility];  Service: Ophthalmology;  Laterality: Left;  Diabetic   CATARACT EXTRACTION W/PHACO Right 09/04/2020   Procedure: CATARACT EXTRACTION PHACO AND INTRAOCULAR LENS PLACEMENT (IOC) RIGHT DIABETIC;  Surgeon: Lockie Mola, MD;  Location: [facility];  Service: Ophthalmology;  Laterality: Right;  4.72 1:03.2 7.4%   CHOLECYSTECTOMY  1977   COLONOSCOPY WITH PROPOFOL N/A 01/21/2016   Procedure: COLONOSCOPY WITH PROPOFOL;  Surgeon: Midge Minium, MD;  Location: [facility];  Service: Endoscopy;  Laterality: N/A;   DILATION AND CURETTAGE OF UTERUS     Due to Amenorrhea   ESOPHAGOGASTRODUODENOSCOPY (EGD) WITH PROPOFOL N/A 08/16/2018   Procedure: ESOPHAGOGASTRODUODENOSCOPY (EGD) WITH PROPOFOL;  Surgeon: Midge Minium, MD;  Location: [facility];  Service: Endoscopy;  Laterality: N/A;   HEMORRHOID BANDING  1980   HEMORROIDECTOMY     HERNIA REPAIR  2011   Temporal Area Excision Biopsy     For Birth Mark Changes, Negative Pathology   TONSILLECTOMY AND ADENOIDECTOMY      Family History  Problem Relation Age of Onset   Cancer Mother        brain tumor   Kidney disease Mother    Hypertension Mother    Heart disease  Father    COPD Father    Hypertension Sister    Arthritis Sister    Cancer Sister    Heart murmur Sister    GER disease Sister    Lupus Sister    Diabetes Sister    Arthritis Sister    Osteopenia Sister    Heart disease Sister    Hypertension Sister    Breast cancer Maternal Aunt 65   Leukemia Maternal Aunt     Social History   Tobacco Use   Smoking status: Former    Current packs/day: 0.00    Average packs/day: 2.0 packs/day for 15.0 years (30.0 ttl pk-yrs)    Types: Cigarettes    Start date: 32    Quit date: 15    Years since quitting: 37.9  Smokeless tobacco: Never   Tobacco comments:    smoking cessation materials not required  Substance Use Topics   Alcohol use: Not Currently    Alcohol/week: 0.0 standard drinks of alcohol     Current Outpatient Medications:    acetaminophen (TYLENOL) 500 MG tablet, Take 1 tablet (500 mg total) by mouth every 6 (six) hours as needed. (Patient taking differently: Take 1,000 mg by mouth every 8 (eight) hours.), Disp: 480 tablet, Rfl: 0   albuterol (VENTOLIN HFA) 108 (90 Base) MCG/ACT inhaler, Inhale 2 puffs into the lungs every 6 (six) hours as needed for wheezing or shortness of breath., Disp: 8 g, Rfl: 5   allopurinol (ZYLOPRIM) 300 MG tablet, Take 1 tablet (300 mg total) by mouth daily., Disp: 90 tablet, Rfl: 3   atorvastatin (LIPITOR) 20 MG tablet, TAKE 1 TABLET BY MOUTH DAILY FOR CHOLESTEROL, Disp: 90 tablet, Rfl: 3   benazepril (LOTENSIN) 5 MG tablet, Take 1 tablet (5 mg total) by mouth daily., Disp: 90 tablet, Rfl: 3   Blood Pressure KIT, 1 each by Does not apply route daily., Disp: 1 kit, Rfl: 0   dapagliflozin propanediol (FARXIGA) 10 MG TABS tablet, Take 1 tablet (10 mg total) by mouth daily before breakfast., Disp: 90 tablet, Rfl: 3   Dulaglutide (TRULICITY) 3 MG/0.5ML SOPN, Inject 3 mg as directed once a week., Disp: 12 mL, Rfl: 3   furosemide (LASIX) 40 MG tablet, Take 1 tablet (40 mg total) by mouth daily., Disp: 90  tablet, Rfl: 3   gabapentin (NEURONTIN) 300 MG capsule, TAKE 1 CAPSULE BY MOUTH IN THE MORNING, 1 CAPSULE IN THE EVENING AND 2 CAPSULES AT NIGHT, Disp: 360 capsule, Rfl: 3   glucose blood (ONETOUCH VERIO) test strip, USE AS DIRECTED, Disp: 50 each, Rfl: 1   Lancets (ONETOUCH ULTRASOFT) lancets, Use as instructed, Disp: 100 each, Rfl: 12   metFORMIN (GLUCOPHAGE-XR) 750 MG 24 hr tablet, Take 1 tablet (750 mg total) by mouth daily with breakfast., Disp: 90 tablet, Rfl: 1   methocarbamol (ROBAXIN) 500 MG tablet, Take 500 mg by mouth 2 (two) times daily., Disp: , Rfl:    nystatin cream (MYCOSTATIN), Apply 1 Application topically 2 (two) times daily., Disp: 60 g, Rfl: 3   potassium chloride SA (KLOR-CON M) 20 MEQ tablet, TAKE 1 TABLET BY MOUTH DAILY, Disp: 90 tablet, Rfl: 3   traMADol (ULTRAM) 50 MG tablet, Take 25-50 mg by mouth 2 (two) times daily as needed., Disp: , Rfl:    triamcinolone ointment (KENALOG) 0.1 %, APPLY TOPICALLY TWICE DAILY AS NEEDED. GENERIC EQUIVALENT FOR KENALOG., Disp: 90 g, Rfl: 0   vitamin B-12 (CYANOCOBALAMIN) 500 MCG tablet, Take 500 mcg by mouth daily., Disp: , Rfl:    Vitamin D, Cholecalciferol, 25 MCG (1000 UT) TABS, Take 1 capsule by mouth daily., Disp: 90 tablet, Rfl: 1   diclofenac Sodium (VOLTAREN) 1 % GEL, APPLY 4 GRAMS TOPICALLY FOUR TIMES DAILY AS DIRECTED; GENERIC EQUIVALENT FOR VOLTAREN, Disp: 300 g, Rfl: 1  Allergies  Allergen Reactions   Codeine Hives and Nausea And Vomiting    I personally reviewed active problem list, medication list, allergies, family history, social history with the patient/caregiver today.   ROS  Ten systems reviewed and is negative except as mentioned in HPI    Objective  Vitals:   09/07/23 1311  BP: 108/68  Pulse: 89  Resp: 16  SpO2: 96%  Weight: 252 lb (114.3 kg)  Height: 5\' 1"  (1.549 m)    Body mass  index is 47.61 kg/m.  Physical Exam  Constitutional: Patient appears well-developed and well-nourished. Obese  No  distress.  HEENT: head atraumatic, normocephalic, pupils equal and reactive to light, neck supple Cardiovascular: Normal rate, regular rhythm and normal heart sounds.  No murmur heard. No BLE edema. Pulmonary/Chest: Effort normal and breath sounds normal. No respiratory distress. Abdominal: Soft.  There is no tenderness. Psychiatric: Patient has a normal mood and affect. behavior is normal. Judgment and thought content normal.   Recent Results (from the past 2160 hour(s))  BLADDER SCAN AMB NON-IMAGING     Status: None   Collection Time: 09/01/23  1:22 PM  Result Value Ref Range   Scan Result 0 ml   POCT HgB A1C     Status: Abnormal   Collection Time: 09/07/23  1:12 PM  Result Value Ref Range   Hemoglobin A1C 7.4 (A) 4.0 - 5.6 %   HbA1c POC (<> result, manual entry)     HbA1c, POC (prediabetic range)     HbA1c, POC (controlled diabetic range)      Diabetic Foot Exam: Diabetic Foot Exam - Simple   Simple Foot Form Visual Inspection No deformities, no ulcerations, no other skin breakdown bilaterally: Yes Sensation Testing Intact to touch and monofilament testing bilaterally: Yes Pulse Check Posterior Tibialis and Dorsalis pulse intact bilaterally: Yes Comments      PHQ2/9:    09/07/2023    1:12 PM 07/28/2023   10:22 AM 05/20/2023    3:54 PM 04/27/2023    1:44 PM 12/21/2022    2:15 PM  Depression screen PHQ 2/9  Decreased Interest 0 0 0 0 0  Down, Depressed, Hopeless 0 0 0 0 0  PHQ - 2 Score 0 0 0 0 0  Altered sleeping 0 0  0 0  Tired, decreased energy 0 0  0 0  Change in appetite 0 0  0 0  Feeling bad or failure about yourself  0 0  0 0  Trouble concentrating 0 0  0 0  Moving slowly or fidgety/restless 0 0  0 0  Suicidal thoughts 0 0  0 0  PHQ-9 Score 0 0  0 0    phq 9 is negative   Fall Risk:    09/07/2023    1:11 PM 07/28/2023   10:22 AM 05/16/2023    1:40 PM 04/27/2023    1:44 PM 12/21/2022    2:06 PM  Fall Risk   Falls in the past year? 0 0 0 0 0  Number falls  in past yr: 0 0     Injury with Fall? 0 0 0    Risk for fall due to : Impaired balance/gait No Fall Risks No Fall Risks No Fall Risks Impaired balance/gait  Follow up Falls prevention discussed Falls prevention discussed Education provided;Falls prevention discussed Falls prevention discussed Falls prevention discussed;Education provided;Falls evaluation completed      Functional Status Survey: Is the patient deaf or have difficulty hearing?: No Does the patient have difficulty seeing, even when wearing glasses/contacts?: No Does the patient have difficulty concentrating, remembering, or making decisions?: Yes Does the patient have difficulty walking or climbing stairs?: Yes Does the patient have difficulty dressing or bathing?: No Does the patient have difficulty doing errands alone such as visiting a doctor's office or shopping?: No    Assessment & Plan 1. Controlled type 2 diabetes mellitus with stage 3 chronic kidney disease, without long-term current use of insulin (HCC)  - POCT HgB A1C -  HM Diabetes Foot Exam - Urine Microalbumin w/creat. ratio - COMPLETE METABOLIC PANEL WITH GFR - Lipid panel - metFORMIN (GLUCOPHAGE-XR) 750 MG 24 hr tablet; Take 1 tablet (750 mg total) by mouth daily with breakfast.  Dispense: 90 tablet; Refill: 1  2. Stage 3a chronic kidney disease (HCC)  - VITAMIN D 25 Hydroxy (Vit-D Deficiency, Fractures) - PTH, intact and calcium  3. Morbid obesity with BMI of 45.0-49.9, adult [facility])  Discussed with the patient the risk posed by an increased BMI. Discussed importance of portion control, calorie counting and at least 150 minutes of physical activity weekly. Avoid sweet beverages and drink more water. Eat at least 6 servings of fruit and vegetables daily    4. Hypertension associated with type 2 diabetes mellitus (HCC)  - metFORMIN (GLUCOPHAGE-XR) 750 MG 24 hr tablet; Take 1 tablet (750 mg total) by mouth daily with breakfast.  Dispense: 90 tablet;  Refill: 1  5. Senile purpura (HCC)  Reassurance given   6. B12 deficiency  - B12 and Folate Panel - CBC with Differential/Platelet  7. Controlled gout  Doing well   8. OSA on CPAP  Compliant   9. Asthma, mild intermittent, well-controlled  She had some congestion this mornings   10. Osteoarthritis, chronic  - diclofenac Sodium (VOLTAREN) 1 % GEL; APPLY 4 GRAMS TOPICALLY FOUR TIMES DAILY AS DIRECTED; GENERIC EQUIVALENT FOR VOLTAREN  Dispense: 300 g; Refill: 1

## 2023-09-07 ENCOUNTER — Encounter: Payer: Self-pay | Admitting: Family Medicine

## 2023-09-07 ENCOUNTER — Ambulatory Visit (INDEPENDENT_AMBULATORY_CARE_PROVIDER_SITE_OTHER): Payer: Medicare Other | Admitting: Family Medicine

## 2023-09-07 VITALS — BP 108/68 | HR 89 | Resp 16 | Ht 61.0 in | Wt 252.0 lb

## 2023-09-07 DIAGNOSIS — E1122 Type 2 diabetes mellitus with diabetic chronic kidney disease: Secondary | ICD-10-CM | POA: Diagnosis not present

## 2023-09-07 DIAGNOSIS — Z7984 Long term (current) use of oral hypoglycemic drugs: Secondary | ICD-10-CM | POA: Diagnosis not present

## 2023-09-07 DIAGNOSIS — M109 Gout, unspecified: Secondary | ICD-10-CM

## 2023-09-07 DIAGNOSIS — N1831 Chronic kidney disease, stage 3a: Secondary | ICD-10-CM | POA: Diagnosis not present

## 2023-09-07 DIAGNOSIS — J4520 Mild intermittent asthma, uncomplicated: Secondary | ICD-10-CM | POA: Diagnosis not present

## 2023-09-07 DIAGNOSIS — M1990 Unspecified osteoarthritis, unspecified site: Secondary | ICD-10-CM | POA: Diagnosis not present

## 2023-09-07 DIAGNOSIS — D692 Other nonthrombocytopenic purpura: Secondary | ICD-10-CM

## 2023-09-07 DIAGNOSIS — G4733 Obstructive sleep apnea (adult) (pediatric): Secondary | ICD-10-CM | POA: Diagnosis not present

## 2023-09-07 DIAGNOSIS — E1159 Type 2 diabetes mellitus with other circulatory complications: Secondary | ICD-10-CM

## 2023-09-07 DIAGNOSIS — I152 Hypertension secondary to endocrine disorders: Secondary | ICD-10-CM

## 2023-09-07 DIAGNOSIS — Z6841 Body Mass Index (BMI) 40.0 and over, adult: Secondary | ICD-10-CM | POA: Diagnosis not present

## 2023-09-07 DIAGNOSIS — E538 Deficiency of other specified B group vitamins: Secondary | ICD-10-CM | POA: Diagnosis not present

## 2023-09-07 DIAGNOSIS — N183 Chronic kidney disease, stage 3 unspecified: Secondary | ICD-10-CM | POA: Diagnosis not present

## 2023-09-07 LAB — POCT GLYCOSYLATED HEMOGLOBIN (HGB A1C): Hemoglobin A1C: 7.4 % — AB (ref 4.0–5.6)

## 2023-09-07 MED ORDER — DICLOFENAC SODIUM 1 % EX GEL: CUTANEOUS | 1 refills | Status: DC

## 2023-09-07 MED ORDER — METFORMIN HCL ER 750 MG PO TB24: 750.0000 mg | ORAL_TABLET | Freq: Every day | ORAL | 1 refills | Status: DC

## 2023-09-07 NOTE — Patient Instructions (Addendum)
Annual Physical Exam: 07/28/2023 Medicare Wellness Visit: 05/20/2023 Last A1c: 09/07/23- 7.4%

## 2023-09-08 LAB — LIPID PANEL
Cholesterol: 152 mg/dL
HDL: 60 mg/dL
LDL Cholesterol (Calc): 68 mg/dL
Non-HDL Cholesterol (Calc): 92 mg/dL
Total CHOL/HDL Ratio: 2.5 (calc)
Triglycerides: 154 mg/dL — ABNORMAL HIGH

## 2023-09-08 LAB — CBC WITH DIFFERENTIAL/PLATELET
Absolute Lymphocytes: 2025 {cells}/uL (ref 850–3900)
Absolute Monocytes: 1013 {cells}/uL — ABNORMAL HIGH (ref 200–950)
Basophils Absolute: 66 {cells}/uL (ref 0–200)
Basophils Relative: 0.8 %
Eosinophils Absolute: 282 {cells}/uL (ref 15–500)
Eosinophils Relative: 3.4 %
HCT: 43.9 % (ref 35.0–45.0)
Hemoglobin: 14.6 g/dL (ref 11.7–15.5)
MCH: 32.5 pg (ref 27.0–33.0)
MCHC: 33.3 g/dL (ref 32.0–36.0)
MCV: 97.8 fL (ref 80.0–100.0)
MPV: 10.6 fL (ref 7.5–12.5)
Monocytes Relative: 12.2 %
Neutro Abs: 4914 {cells}/uL (ref 1500–7800)
Neutrophils Relative %: 59.2 %
Platelets: 228 10*3/uL (ref 140–400)
RBC: 4.49 Million/uL (ref 3.80–5.10)
RDW: 14 % (ref 11.0–15.0)
Total Lymphocyte: 24.4 %
WBC: 8.3 10*3/uL (ref 3.8–10.8)

## 2023-09-08 LAB — COMPLETE METABOLIC PANEL WITHOUT GFR
AG Ratio: 1.7 (calc) (ref 1.0–2.5)
ALT: 13 U/L (ref 6–29)
AST: 16 U/L (ref 10–35)
Albumin: 4.3 g/dL (ref 3.6–5.1)
Alkaline phosphatase (APISO): 83 U/L (ref 37–153)
BUN/Creatinine Ratio: 30 (calc) — ABNORMAL HIGH (ref 6–22)
BUN: 43 mg/dL — ABNORMAL HIGH (ref 7–25)
CO2: 31 mmol/L (ref 20–32)
Calcium: 10 mg/dL (ref 8.6–10.4)
Chloride: 99 mmol/L (ref 98–110)
Creat: 1.43 mg/dL — ABNORMAL HIGH (ref 0.60–1.00)
Globulin: 2.6 g/dL (ref 1.9–3.7)
Glucose, Bld: 130 mg/dL — ABNORMAL HIGH (ref 65–99)
Potassium: 5 mmol/L (ref 3.5–5.3)
Sodium: 140 mmol/L (ref 135–146)
Total Bilirubin: 0.6 mg/dL (ref 0.2–1.2)
Total Protein: 6.9 g/dL (ref 6.1–8.1)
eGFR: 38 mL/min/{1.73_m2} — ABNORMAL LOW

## 2023-09-08 LAB — MICROALBUMIN / CREATININE URINE RATIO
Creatinine, Urine: 20 mg/dL (ref 20–275)
Microalb Creat Ratio: 30 mg/g{creat} — ABNORMAL HIGH
Microalb, Ur: 0.6 mg/dL

## 2023-09-08 LAB — PTH, INTACT AND CALCIUM
Calcium: 10 mg/dL (ref 8.6–10.4)
PTH: 82 pg/mL — ABNORMAL HIGH (ref 16–77)

## 2023-09-08 LAB — B12 AND FOLATE PANEL
Folate: 7.2 ng/mL
Vitamin B-12: 800 pg/mL (ref 200–1100)

## 2023-09-08 LAB — VITAMIN D 25 HYDROXY (VIT D DEFICIENCY, FRACTURES): Vit D, 25-Hydroxy: 36 ng/mL (ref 30–100)

## 2023-09-09 ENCOUNTER — Other Ambulatory Visit: Payer: Self-pay | Admitting: Pharmacy Technician

## 2023-09-09 DIAGNOSIS — Z5986 Financial insecurity: Secondary | ICD-10-CM

## 2023-09-09 DIAGNOSIS — Z59868 Other specified financial insecurity: Secondary | ICD-10-CM

## 2023-09-09 NOTE — Progress Notes (Signed)
Pharmacy Medication Assistance Program Note    09/09/2023  Patient ID: Amber Avery, female   DOB: 1945-10-28, 77 y.o.   MRN: [id number]     09/09/2023  Outreach Medication One  Manufacturer Medication One Nurse, adult Drugs Farxiga  Dose of Farxiga 10mg   Type of Radiographer, therapeutic Assistance  Date Application Received From Patient 08/25/2023  Application Items Received From Patient Application;Proof of Income;Other  Date Application Received From Provider 08/23/2023  Date Application Submitted to Manufacturer 09/08/2023  Method Application Sent to Manufacturer Fax          09/09/2023  Outreach Medication Two  Manufacturer Medication Two Retail buyer Drugs Trulicity  Dose of Trulicity 3mg /0.77ml  Type of Radiographer, therapeutic Assistance  Date Application Received From Patient 08/25/2023  Application Items Received From Patient Application;Proof of Income;Other  Date Application Received From Provider 08/23/2023  Method Application Sent to Manufacturer Fax  Date Application Submitted to Manufacturer 09/08/2023       Signature   Kristopher Glee [place]  Office: [phone] Fax: [phone] Email: [email or web address]

## 2023-09-15 DIAGNOSIS — G4733 Obstructive sleep apnea (adult) (pediatric): Secondary | ICD-10-CM | POA: Diagnosis not present

## 2023-09-15 DIAGNOSIS — F5101 Primary insomnia: Secondary | ICD-10-CM | POA: Diagnosis not present

## 2023-09-15 DIAGNOSIS — I10 Essential (primary) hypertension: Secondary | ICD-10-CM | POA: Diagnosis not present

## 2023-09-17 DIAGNOSIS — J449 Chronic obstructive pulmonary disease, unspecified: Secondary | ICD-10-CM | POA: Diagnosis not present

## 2023-09-19 DIAGNOSIS — E119 Type 2 diabetes mellitus without complications: Secondary | ICD-10-CM | POA: Diagnosis not present

## 2023-09-21 DIAGNOSIS — M7062 Trochanteric bursitis, left hip: Secondary | ICD-10-CM | POA: Diagnosis not present

## 2023-09-23 DIAGNOSIS — Z961 Presence of intraocular lens: Secondary | ICD-10-CM | POA: Diagnosis not present

## 2023-09-23 DIAGNOSIS — H353132 Nonexudative age-related macular degeneration, bilateral, intermediate dry stage: Secondary | ICD-10-CM | POA: Diagnosis not present

## 2023-09-23 DIAGNOSIS — H179 Unspecified corneal scar and opacity: Secondary | ICD-10-CM | POA: Diagnosis not present

## 2023-09-23 DIAGNOSIS — Z0100 Encounter for examination of eyes and vision without abnormal findings: Secondary | ICD-10-CM | POA: Diagnosis not present

## 2023-09-23 DIAGNOSIS — E119 Type 2 diabetes mellitus without complications: Secondary | ICD-10-CM | POA: Diagnosis not present

## 2023-09-23 LAB — HM DIABETES EYE EXAM

## 2023-10-05 ENCOUNTER — Encounter: Payer: Self-pay | Admitting: Family Medicine

## 2023-10-15 DIAGNOSIS — G4733 Obstructive sleep apnea (adult) (pediatric): Secondary | ICD-10-CM | POA: Diagnosis not present

## 2023-10-15 DIAGNOSIS — F5101 Primary insomnia: Secondary | ICD-10-CM | POA: Diagnosis not present

## 2023-10-15 DIAGNOSIS — I10 Essential (primary) hypertension: Secondary | ICD-10-CM | POA: Diagnosis not present

## 2023-10-17 DIAGNOSIS — J449 Chronic obstructive pulmonary disease, unspecified: Secondary | ICD-10-CM | POA: Diagnosis not present

## 2023-10-20 DIAGNOSIS — E119 Type 2 diabetes mellitus without complications: Secondary | ICD-10-CM | POA: Diagnosis not present

## 2023-10-29 ENCOUNTER — Telehealth: Payer: Self-pay | Admitting: Pharmacy Technician

## 2023-10-29 DIAGNOSIS — Z59868 Other specified financial insecurity: Secondary | ICD-10-CM

## 2023-10-29 DIAGNOSIS — Z5986 Financial insecurity: Secondary | ICD-10-CM

## 2023-10-29 NOTE — Progress Notes (Signed)
 Pharmacy Medication Assistance Program Note    10/29/2023  Patient ID: WAKEELAH SOLAN, female   DOB: 1945/11/26, 78 y.o.   MRN: [id number]     09/09/2023 10/29/2023  Outreach Medication One  Manufacturer Medication One [employer]   [employer] Drugs Farxiga    Dose of Farxiga  10mg    Type of Radiographer, Therapeutic Assistance   Date Application Received From Patient 08/25/2023   Application Items Received From Patient Application;Proof of Income;Other   Date Application Received From Provider 08/23/2023   Date Application Submitted to Manufacturer 09/08/2023   Method Application Sent to Manufacturer Fax   Patient Assistance Determination  Approved  Approval Start Date  10/20/2023  Approval End Date  10/18/2024  Additional Outreach Contact  Provider  Contacted Provider  Message       09/09/2023 10/29/2023  Outreach Medication Two  Manufacturer Medication Two [employer]   [employer] [facility] Trulicity    Dose of Trulicity  3mg /0.24ml   Type of Radiographer, Therapeutic Assistance   Date Application Received From Patient 08/25/2023   Application Items Received From Patient Application;Proof of Income;Other   Date Application Received From Provider 08/23/2023   Method Application Sent to Manufacturer Fax   Date Application Submitted to Manufacturer 09/08/2023   Patient Assistance Determination  Approved  Approval Start Date  10/20/2023  Additional Outreach Contact  Provider  Contacted Provider  Message    Two care coordination calls placed to [employer] in regard to Farxiga  and [employer] in regard to Trulicity .  Per the [employer] IVR system patient is APPROVED 10/20/23/10/18/24 for Farxiga . Medication will auto fill and ship to patient's home. Patient may call [employer] at any time to check on next shipment by calling [phone].  Per Josette at [facility], patient is APPROVED 10/20/23-10/18/24. Medication will auto fill and ship to patient's home. Patient may call [employer] at any time to check on next shipment by calling  [phone]. In this case, patient  last received 4 boxes on 08/03/23. She informs next shipment should start processing next week with a delivery by end of Jan or first of February.  Will route message to [facility] PHarmD to notify patient at next scheduled office visit.  Madesyn Ast, CPhT [place]  Office: [phone] Fax: [phone] Email: [email or web address]

## 2023-11-07 ENCOUNTER — Other Ambulatory Visit: Payer: Self-pay | Admitting: Family Medicine

## 2023-11-07 DIAGNOSIS — M1990 Unspecified osteoarthritis, unspecified site: Secondary | ICD-10-CM

## 2023-11-15 DIAGNOSIS — I10 Essential (primary) hypertension: Secondary | ICD-10-CM | POA: Diagnosis not present

## 2023-11-15 DIAGNOSIS — F5101 Primary insomnia: Secondary | ICD-10-CM | POA: Diagnosis not present

## 2023-11-15 DIAGNOSIS — G4733 Obstructive sleep apnea (adult) (pediatric): Secondary | ICD-10-CM | POA: Diagnosis not present

## 2023-11-17 ENCOUNTER — Other Ambulatory Visit: Payer: Self-pay | Admitting: Pharmacist

## 2023-11-17 DIAGNOSIS — N183 Chronic kidney disease, stage 3 unspecified: Secondary | ICD-10-CM

## 2023-11-17 DIAGNOSIS — E1122 Type 2 diabetes mellitus with diabetic chronic kidney disease: Secondary | ICD-10-CM

## 2023-11-17 NOTE — Patient Instructions (Signed)
Goals Addressed             This Visit's Progress    Pharmacy Goals       The goal A1c is less than 7%. This is the best way to reduce the risk of the long term complications of diabetes, including heart disease, kidney disease, eye disease, strokes, and nerve damage. An A1c of less than 7% corresponds with fasting sugars less than 130 and 2 hour after meal sugars less than 180.    If you need to reach out to patient assistance programs in the future regarding refills or to find out the status of your application, you can do so by calling:   [employer] at [phone] [employer] at [phone]      Estelle Grumbles, PharmD, [facility] [phone]

## 2023-11-17 NOTE — Progress Notes (Signed)
11/17/2023 Name: Amber Avery MRN: [id number] DOB: 27-Aug-1946  Chief Complaint  Patient presents with   Medication Assistance    Amber Avery is a 78 y.o. year old female who presented for a telephone visit.   They were referred to the pharmacist by their PCP for assistance in managing medication access.      Subjective:   Care Team: Primary Care Provider: Alba Cory, MD ; Next Scheduled Visit: 01/05/2024 Urologist: Hulan Fray; Next Scheduled Visit: 12/01/2023 Physical Medicine and Rehabilitation: Chanda Busing, DO ; Next Scheduled Visit: 01/03/2024  Medication Access/Adherence  Current Pharmacy:  [facility] - [place], [place] - [address] [place] [address] Phone: [phone] Fax: [phone]  [facility] - [place], [place] - [address] [place] [address] Phone: [phone] Fax: [phone]  [facility] - [place], [place] - [address] [place] [address] [place] [address] Phone: [phone] Fax: [phone]   Patient reports affordability concerns with their medications: No  Patient reports access/transportation concerns to their pharmacy: No  Patient reports adherence concerns with their medications:  No     Diabetes:   Current medications:  - Farxiga 10 mg daily - Trulicity 3 mg injection weekly  - metformin ER 750 mg daily with breakfast   Reports recent blood sugar readings: this morning: 140 (fruit cake night before); typically ranging 120-140 - Reports using blood sugar checks as feedback on dietary choices   Patient denies hypoglycemic s/sx including dizziness, shakiness, sweating.   Reports working on weight loss; today: 248 lbs   Current physical activity: reports limited by hip pain; walks with walker or scooter - Reports has noticed the impact on her blood sugar when receives steroid injections  for her hip pain   Statin therapy: atorvastatin 20 mg daily   Current medication access support: enrolled in patient assistance for Farxiga from [employer] and for Trulicity from [place] patient assistance program through 10/18/2024    Objective:  Lab Results  Component Value Date   HGBA1C 7.4 (A) 09/07/2023    Lab Results  Component Value Date   CREATININE 1.43 (H) 09/07/2023   BUN 43 (H) 09/07/2023   NA 140 09/07/2023   K 5.0 09/07/2023   CL 99 09/07/2023   CO2 31 09/07/2023    Lab Results  Component Value Date   CHOL 152 09/07/2023   HDL 60 09/07/2023   LDLCALC 68 09/07/2023   TRIG 154 (H) 09/07/2023   CHOLHDL 2.5 09/07/2023    Medications Reviewed Today     Reviewed by Manuela Neptune, RPH-CPP (Pharmacist) on 11/17/23 at 1320  Med List Status: <None>   Medication Order Taking? Sig Documenting Provider Last Dose Status Informant  acetaminophen (TYLENOL) 500 MG tablet [id number]  Take 1 tablet (500 mg total) by mouth every 6 (six) hours as needed.  Patient taking differently: Take 1,000 mg by mouth every 8 (eight) hours.   Alba Cory, MD  Active   albuterol (VENTOLIN HFA) 108 (90 Base) MCG/ACT inhaler [id number]  Inhale 2 puffs into the lungs every 6 (six) hours as needed for wheezing or shortness of breath. Alba Cory, MD  Active   allopurinol (ZYLOPRIM) 300 MG tablet [id number]  Take 1 tablet (300 mg total) by mouth daily. Alba Cory, MD  Active   atorvastatin (LIPITOR) 20 MG tablet [id number]  TAKE 1 TABLET BY  MOUTH DAILY FOR CHOLESTEROL Sowles, Danna Hefty, MD  Active   benazepril (LOTENSIN) 5 MG tablet [id number]  Take 1 tablet (5 mg total) by mouth daily. Alba Cory, MD  Active   Blood Pressure KIT [id number]  1 each by Does not apply route daily. Alba Cory, MD  Active   dapagliflozin propanediol (FARXIGA) 10 MG TABS tablet [id number] Yes Take 1 tablet (10 mg total) by mouth daily before breakfast. Alba Cory, MD Taking Active   diclofenac  Sodium (VOLTAREN) 1 % GEL [id number]  APPLY 4 GRAMS TOPICALLY FOUR TIMES DAILY AS DIRECTED GENERIC EQUIVALENT FOR VOLTAREN Sowles, Danna Hefty, MD  Active   Dulaglutide (TRULICITY) 3 MG/0.5ML SOPN [id number] Yes Inject 3 mg as directed once a week. Alba Cory, MD Taking Active   furosemide (LASIX) 40 MG tablet [id number]  Take 1 tablet (40 mg total) by mouth daily. Alba Cory, MD  Active   gabapentin (NEURONTIN) 300 MG capsule [id number]  TAKE 1 CAPSULE BY MOUTH IN THE MORNING, 1 CAPSULE IN THE EVENING AND 2 CAPSULES AT Mittie Bodo, Danna Hefty, MD  Active   glucose blood [facility] VERIO) test strip [id number]  USE AS DIRECTED Alba Cory, MD  Active   Lancets [facility] ULTRASOFT) lancets [id number]  Use as instructed Alba Cory, MD  Active   metFORMIN (GLUCOPHAGE-XR) 750 MG 24 hr tablet [id number] Yes Take 1 tablet (750 mg total) by mouth daily with breakfast. Alba Cory, MD Taking Active   methocarbamol (ROBAXIN) 500 MG tablet [id number]  Take 500 mg by mouth 2 (two) times daily. Merri Ray, MD  Active            Med Note Ronney Asters, [facility] A   Mon Aug 23, 2023  4:18 PM) Takes at bedtime  nystatin ointment (MYCOSTATIN) [id number] Yes Apply 1 Application topically 2 (two) times daily as needed. [provider]  Active   potassium chloride SA (KLOR-CON M) 20 MEQ tablet [id number]  TAKE 1 TABLET BY MOUTH DAILY Sowles, Danna Hefty, MD  Active   traMADol (ULTRAM) 50 MG tablet [id number]  Take 25-50 mg by mouth 2 (two) times daily as needed. Merri Ray, MD  Active            Med Note Julious Payer A   Wed Mar 04, 2022  3:03 PM)  Prescribed by Burman Freestone, NP  triamcinolone ointment (KENALOG) 0.1 % [id number]  APPLY TOPICALLY TWICE DAILY AS NEEDED. GENERIC EQUIVALENT FOR KENALOG. Alba Cory, MD  Active   vitamin B-12 (CYANOCOBALAMIN) 500 MCG tablet [id number]  Take 500 mcg by mouth daily. [provider]  Active   Vitamin D, Cholecalciferol, 25 MCG (1000 UT)  TABS [id number]  Take 1 capsule by mouth daily. Alba Cory, MD  Active               Assessment/Plan:   Diabetes: - Reviewed dietary modifications including importance of having regular well-balanced meals and snacks throughout the day, while controlling carbohydrate portion sizes             Encourage to review nutrition labels for carbohydrate content of foods - Recommend to check glucose, keep log of results and have this record to review at upcoming medical appointments. Patient to contact provider office sooner if needed for readings outside of established parameters or symptoms - Patient to contact [employer] patient assistance as needed for refills of Marcelline Deist and [place] patient assistance as needed for Trulicity       Follow Up Plan: Clinical Pharmacist will follow up with patient by telephone  on 08/09/2024 at 11:30 AM     Estelle Grumbles, PharmD, [facility] [phone]

## 2023-11-20 DIAGNOSIS — E119 Type 2 diabetes mellitus without complications: Secondary | ICD-10-CM | POA: Diagnosis not present

## 2023-11-24 ENCOUNTER — Other Ambulatory Visit: Payer: Self-pay | Admitting: Family Medicine

## 2023-11-24 ENCOUNTER — Telehealth: Payer: Self-pay | Admitting: Family Medicine

## 2023-11-24 DIAGNOSIS — M1990 Unspecified osteoarthritis, unspecified site: Secondary | ICD-10-CM

## 2023-11-24 MED ORDER — VOLTAREN 1 % EX GEL: 4.0000 g | Freq: Four times a day (QID) | CUTANEOUS | 3 refills | Status: AC

## 2023-11-24 NOTE — Telephone Encounter (Signed)
Pt confirmed [facility]

## 2023-11-24 NOTE — Telephone Encounter (Signed)
 Copied from CRM [id number]. Topic: General - Other >> Nov 24, 2023 11:46 AM Adaline Holly wrote: Patient states that her insurance no longer covers diclofenac  Sodium 1 % GEL. Patient states that they will only cover Voltaren  1%. Please advise.

## 2023-11-26 ENCOUNTER — Encounter: Payer: Self-pay | Admitting: Pharmacist

## 2023-11-26 DIAGNOSIS — M1990 Unspecified osteoarthritis, unspecified site: Secondary | ICD-10-CM

## 2023-12-01 ENCOUNTER — Other Ambulatory Visit: Payer: Self-pay | Admitting: Family Medicine

## 2023-12-01 ENCOUNTER — Ambulatory Visit: Payer: Self-pay | Admitting: Urology

## 2023-12-01 DIAGNOSIS — L409 Psoriasis, unspecified: Secondary | ICD-10-CM

## 2023-12-18 DIAGNOSIS — E119 Type 2 diabetes mellitus without complications: Secondary | ICD-10-CM | POA: Diagnosis not present

## 2024-01-03 DIAGNOSIS — M48062 Spinal stenosis, lumbar region with neurogenic claudication: Secondary | ICD-10-CM | POA: Diagnosis not present

## 2024-01-03 DIAGNOSIS — M5412 Radiculopathy, cervical region: Secondary | ICD-10-CM | POA: Diagnosis not present

## 2024-01-03 DIAGNOSIS — M47812 Spondylosis without myelopathy or radiculopathy, cervical region: Secondary | ICD-10-CM | POA: Diagnosis not present

## 2024-01-03 DIAGNOSIS — M5416 Radiculopathy, lumbar region: Secondary | ICD-10-CM | POA: Diagnosis not present

## 2024-01-03 DIAGNOSIS — Z79899 Other long term (current) drug therapy: Secondary | ICD-10-CM | POA: Diagnosis not present

## 2024-01-03 NOTE — Progress Notes (Signed)
 HPI The patient is a pleasant 78 year old female who presents today for follow-up of acute on chronic left-sided low back pain with radiating pain to the left buttock, posterior lateral thigh and calf.  She has been followed by Dr. Avanell in the past and had good relief with epidural steroid injections and bursa injections under fluoroscopy.  Her pain began in may 2022.  She rates her pain as ranging from mild to severe.  Her pain is constant with associated feeling of numbness and tingling and progressively worsening.  Her pain is worsened with standing and walking and alleviated when sitting and lying in bed.  In the past physical therapy was not beneficial and increased her pain.    Medications include Tylenol  and gabapentin  both of which provide mild benefit.  She has moved in with her sister due to her pain and has been using a walker for the last 2 months.  She is lost approximately 30 pounds over the last year and is working to lose more.  She was last evaluated on 07/08/2023 at which time she was experiencing 85% relief following bursa injection.  She was to continue with Tylenol  and gabapentin  and Voltaren  gel along with exercises as learned in physical therapy.  She was to continue with occasional use of Robaxin  and tramadol .  At today's visit she reports that she has done well over the last several months. She states she has done well since her last bursa injection in December.  She continues on tramadol  nightly and occasionally second time during the day if needed.  She stays active continuing to make meals for herself and enjoys crocheting making blankets and sweaters for her friends and family.  She keeps her medication as a place and does not share with others.  She states she is not interested in any treatment changes at this time.   The [place]  Narcotic Database was reviewed today.  The patient has signed a curator contract (05/31/2023). We have discussed the  realistic benefits and known risk of opioid therapy.  Patient verbalized understanding. Dr. Avanell and I dicussed the role of long term narcotics for this patient and is in agreeance with the medication prescribed.  UDS toxassure obtained on 01/03/2024 () last dose was on 01/02/2024  Injections: 09/21/2023: Left trochanteric bursa injection under fluoro (90% relief) 06/17/2023: Left trochanteric bursa injection (85% relief) 10/05/2022: Left L5-S1 and left S1 transforaminal ESI (50% relief) 01/07/2022: Left L5-S1 and left S1 transforaminal ESI (50% relief) 12/30/2021: Left S1 trigger point injection (moderate benefit) 10/21/2021: Left trochanteric bursa injection under fluoro (little relief) 09/16/2021: Left L5-S1 and left S1 transforaminal ESI (improvement of lower extremity pain continued left lateral hip pain.) 06/11/2021: Left L5-S1 and left S1 transforaminal ESI (60% improvement)  05/16/2021:  Left L5-S1 transforaminal ESI (50% improvement) 05/05/2021:  Left piriformis trigger point injection (no relief) 05/21/2015:  Right superior gluteal trigger point injection (moderate relief) 04/15/2015:  Left trochanteric bursa injection (85% relief) 12/11/2014:  Left trochanteric bursa injection under fluoro (good relief) 11/21/2014:  Left L5-S1 transforaminal ESI (good relief) 09/18/2014:  Left L5-S1 transforaminal ESI (75% relief) 08/23/2014:  Left L5-S1 transforaminal ESI  Past Medical History:  Diagnosis Date   Arteriosclerosis of coronary artery    Arthritis    CKD (chronic kidney disease), stage II    Diabetes mellitus type 2, uncomplicated (CMS/HHS-HCC)    Dyslipidemia    Gout    Hyperlipidemia    Hypertension    Osteoarthritis    Sleep apnea  Past Surgical History:  Procedure Laterality Date   CHOLECYSTECTOMY  1977   APPENDECTOMY  1977   HEMORROIDECTOMY  1980   RESECTION TUMOR SCALP / FACE RADICAL  1982   HERNIA REPAIR  2011   COLONOSCOPY     dilation  and curettage of uterus     [facility]  1967, 03/26/2011   TONSILLECTOMY & ADENOIDECTOMY      Social History   Socioeconomic History   Marital status: Single  Tobacco Use   Smoking status: Former    Current packs/day: 0.00    Types: Cigarettes    Quit date: 1983    Years since quitting: 42.2   Smokeless tobacco: Never  Vaping Use   Vaping status: Never Used  Substance and Sexual Activity   Alcohol  use: No   Social Drivers of Corporate Investment Banker Strain: Low Risk  (01/02/2024)   Received from [facility]   Overall Financial Resource Strain (CARDIA)    Difficulty of Paying Living Expenses: Not very hard  Food Insecurity: No Food Insecurity (01/02/2024)   Received from [facility]   Hunger Vital Sign    Worried About Running Out of Food in the Last Year: Never true    Ran Out of Food in the Last Year: Never true  Transportation Needs: No Transportation Needs (01/02/2024)   Received from [facility]    Lack of Transportation (Medical): No    Lack of Transportation (Non-Medical): No    Current Outpatient Medications on File Prior to Visit  Medication Sig Dispense Refill   acetaminophen  (TYLENOL ) 500 MG tablet Take by mouth Take 1 tablet (500 mg total) by mouth every 6 (six) hours as needed.     albuterol  MDI, PROVENTIL , VENTOLIN , PROAIR , HFA 90 mcg/actuation inhaler Inhale 2 Inhalations into the lungs every 6 (six) hours as needed     allopurinol  (ZYLOPRIM ) 300 MG tablet Take 300 mg by mouth once daily.     atorvastatin  (LIPITOR) 20 MG tablet Take 20 mg by mouth once daily     benazepriL  (LOTENSIN ) 5 MG tablet Take 5 mg by mouth once daily     blood glucose diagnostic test strip ONETOUCH ULTRA BLUE (In Vitro Strip)  1 Strip use as directed for 90 days  Quantity: 1;  Refills: 2   Ordered :10-Feb-2011  GLENARD MIRE MD;  [place] 10-Feb-2011 Active     clobetasoL  (TEMOVATE ) 0.05 % ointment      cyanocobalamin  (VITAMIN B12) 500 MCG  tablet Take by mouth     dapagliflozin  propanediol (FARXIGA ) 10 mg tablet Take by mouth     diclofenac  (VOLTAREN ) 1 % topical gel Apply 2 g topically every 6 (six) hours as needed.     dulaglutide  (TRULICITY ) 3 mg/0.5 mL subcutaneous pen injector Inject 3 mg as directed once a week     gabapentin  (NEURONTIN ) 300 MG capsule Take 300 mg by mouth 3 (three) times daily.     metFORMIN  (GLUCOPHAGE -XR) 750 MG XR tablet Take 750 mg by mouth daily with breakfast     methocarbamoL  (ROBAXIN ) 500 MG tablet TAKE 1 TABLET BY MOUTH 2 TIMES DAILY AS NEEDED. GENERIC EQUIVALENT FOR ROBAXIN  180 tablet 1   ONETOUCH ULTRASOFT lancets Use as instructed.     potassium chloride  (K-DUR,KLOR-CON ) 20 MEQ ER tablet Take 20 mEq by mouth once daily.     triamcinolone  0.1 % cream Apply topically as directed.     triamcinolone  0.1 % ointment Apply topically as needed  No current facility-administered medications on file prior to visit.    Allergies as of 01/03/2024 - Reviewed 01/03/2024  Allergen Reaction Noted   Codeine Hives, Headache, and Vomiting 08/09/2014    ROS More than 10 system, review of system form was given to the patient to fill out and has been signed by Dr. Avanell and scanned into the patient's chart.   Vitals Vitals:   01/03/24 1305  BP: 114/60  Pulse: 74  Temp: 36.4 C (97.6 F)  TempSrc: Oral  Weight: (!) 126.1 kg (278 lb)  Height: 157.5 cm (5' 2.01)  PainSc:   4  PainLoc: Back   Exam General: Alert oriented morbidly obese  Cervical exam (11/30/2022) Upon inspection there are no rashes or scars.  Cervical rotation is full.  Spurling's maneuver is negative bilaterally.  Upper extremity exam  she has 5/5 strengthg bilateral wrist extensors, biceps, triceps and deltoids, shoulder internal/external rotators.  Sensation intact to light touch bilaterally.  Unable to elicit bilateral bicep, tricep and brachialis duplexes bilaterally.  Negative Hoffmann's bilaterally.  Empty can  test is negative bilaterally.  Vonzell Berber exam produces mild right posterior shoulder pain.  She has mild tenderness to palpation to the right shoulder joint.  Full range of motion of the shoulder joints without pain elicited.  No tenderness noted upon palpation to the upper thoracic spine.  Lumbar exam (12/23/2021) Upon inspection there are no rashes or scars.  She has little tenderness to palpation to the left lumbosacral paraspinal musculature.  Nontender to palpation to the right paraspinal musculature.  Extension rotation is limited and produces mild left low back pain.  Lower extremity exam She has 5/5 strength in bilateral dorsiflexors, knee extensors and 4/5 strength in bilateral hip flexors.  Sensation intact to light touch bilaterally.  Unable to elicit bilateral patellar and Achilles reflexes.  No ankle clonus.  Straight leg raise is negative bilaterally.  Full range of motion to bilateral hip joints without pain elicited.  Radiology   Impression 1.  Acute left low back pain that is rated severe inhibiting patient from performing daily activities.  Little tenderness upon palpation extension rotation produces mild pain.  Order placed for MRI of the lumbar spine stat for further evaluation of this pain.  Clinically her symptoms may involve facet mediated pain versus muscle spasm versus disc herniation.MRI of the lumbar spine without contrast from [facility] dated 12/24/2021 imaging and report reviewed today.  L5-S1 degenerative disc disease with annular disc bulging with mild bilateral facet arthropathy with mild to moderate right and mild left foraminal stenosis.  L3-4 grade 1 anterolisthesis with severe bilateral facet arthropathy and mild bilateral lateral recess and foraminal stenosis.  L3-4 disc bulging with moderate bilateral facet arthropathy and ligamentum flavum hypertrophy.  No stenosis.  L2-3 disc bulging with mild bilateral facet arthropathy without stenosis.  L1 to no central or  foraminal stenosis.  2.  Low back pain with radiation into the left buttock, radiating to the left posterior lateral thigh and calf.  Clinically she has symptoms consistent with degenerative disc disease and a lumbosacral radiculitis along with piriformis syndrome.   Lumbar spine x-ray from [facility] clinic dated 05/05/2021 revealed severe multilevel lower thoracic and lumbar degenerative changes.  3.  Left trochanteric bursitis.  4.  Bilateral knee pain secondary to degenerative joint disease.  She is followed by orthopedics.  5.  Diabetes mellitus type 2, chronic kidney disease hypertension, dyslipidemia, gout. 6.  Chronic Right lateral neck pain extending to the right shoulder.  Clinically her symptoms seem to involve a cervical radicular component Cervical spine x-rays from [facility] clinic dated 07/08/2023 revealed multilevel degenerative changes. 7.  History of left carpal tunnel syndrome 8.  Mid upper back pain that is intermittent.  Symptoms are most likely muscular however she prefers to avoid imaging at this time.  Plan 1.  Continue Tylenol  arthritis 5 tablets daily p.r.n.. 2.  Continue gabapentin  300 mg t.i.d. as previously prescribed. 3.  Continue Voltaren  gel as previously prescribed. 4. Continue with exercises as learned in physical therapy  5.  Continue with occasional Robaxin  500 mg 3 times daily #60 sent to pharmacy. 6.  Continue with Tramadol  50 mg 1/2 to 1 tablet p.o. twice daily PRN #60 , 3 refills sent to pharmacy 7.  She will follow-up with me in 6 months for reevaluation.  If her pain does not improve she will reach out to our office sooner at which time consideration can be made for an MRI of the cervical spine without contrast.  I personally performed the service, non-incident to.  (WP)  WHITNEY MEELER, NP  This note was generated in part with voice recognition software and I apologize for any typographical errors that were not detected and corrected.  PCP: Dr.  Glenard

## 2024-01-05 ENCOUNTER — Ambulatory Visit: Payer: Medicare Other | Admitting: Family Medicine

## 2024-01-05 ENCOUNTER — Encounter: Payer: Self-pay | Admitting: Family Medicine

## 2024-01-05 VITALS — BP 104/66 | HR 88 | Resp 16 | Ht 61.0 in | Wt 252.8 lb

## 2024-01-05 DIAGNOSIS — R600 Localized edema: Secondary | ICD-10-CM

## 2024-01-05 DIAGNOSIS — J4520 Mild intermittent asthma, uncomplicated: Secondary | ICD-10-CM | POA: Diagnosis not present

## 2024-01-05 DIAGNOSIS — E1122 Type 2 diabetes mellitus with diabetic chronic kidney disease: Secondary | ICD-10-CM

## 2024-01-05 DIAGNOSIS — G894 Chronic pain syndrome: Secondary | ICD-10-CM | POA: Insufficient documentation

## 2024-01-05 DIAGNOSIS — M109 Gout, unspecified: Secondary | ICD-10-CM

## 2024-01-05 DIAGNOSIS — N183 Chronic kidney disease, stage 3 unspecified: Secondary | ICD-10-CM | POA: Diagnosis not present

## 2024-01-05 DIAGNOSIS — N1831 Chronic kidney disease, stage 3a: Secondary | ICD-10-CM | POA: Diagnosis not present

## 2024-01-05 DIAGNOSIS — R2681 Unsteadiness on feet: Secondary | ICD-10-CM | POA: Diagnosis not present

## 2024-01-05 DIAGNOSIS — G4733 Obstructive sleep apnea (adult) (pediatric): Secondary | ICD-10-CM

## 2024-01-05 DIAGNOSIS — N2581 Secondary hyperparathyroidism of renal origin: Secondary | ICD-10-CM | POA: Insufficient documentation

## 2024-01-05 DIAGNOSIS — E538 Deficiency of other specified B group vitamins: Secondary | ICD-10-CM | POA: Diagnosis not present

## 2024-01-05 DIAGNOSIS — Z6841 Body Mass Index (BMI) 40.0 and over, adult: Secondary | ICD-10-CM | POA: Diagnosis not present

## 2024-01-05 LAB — POCT GLYCOSYLATED HEMOGLOBIN (HGB A1C): Hemoglobin A1C: 7.1 % — AB (ref 4.0–5.6)

## 2024-01-05 MED ORDER — FUROSEMIDE 20 MG PO TABS: 20.0000 mg | ORAL_TABLET | Freq: Two times a day (BID) | ORAL | 1 refills | Status: DC

## 2024-01-05 MED ORDER — METFORMIN HCL ER 750 MG PO TB24: 750.0000 mg | ORAL_TABLET | Freq: Every day | ORAL | 1 refills | Status: DC

## 2024-01-05 NOTE — Progress Notes (Signed)
 Name: Amber Avery   MRN: [id number]    DOB: 1946/05/27   Date:01/05/2024       Progress Note  Subjective  Chief Complaint  Chief Complaint  Patient presents with   Medical Management of Chronic Issues   HPI   History of HTN: bp has been low and she is feeling tired. We will stop lotensin and start taking lasix 20 mg and take once of twice daily for lower extremity edema    Dyslipidemia: taking statin therapy, no side effects, no chest pain or myalgia. Last LDL has been controlled     Asthma Mild intermittent: she states she has been doing well, no wheezing, cough , mild SOB with activity but likely multifactorial. Taking inahler prn    DMII: She is on Farxiga and Trulicity 3 mg through assistance program  and also on Metformin since last visit and A1C is down from 7.4 % to 7.1 %. We will continue to monitor kidney function, we will stop ACE due to low bp.  She denies  polyphagia, polyuria (stable because of diuretic) denies  polydipsia . She has dyslipidemia and last LDL was at goal, she has   CKI stage III a .  CKI stage IIIa: and secondary hyperparathyroidism, stay off nsaid's, recheck labs next visit ( did not want to repeat it today) denies pruritus , has good urine output   Morbid obesity: BMI over 40 ,weight is down 10 lbs since July 2024.  Weight has been stable since last visit    OSA on CPAP: she wear machine every night, she uses oxygen with CPAP . Doing well Keeps it on all night     OA : she saw Ortho , Dr. Landry Mellow, and he advised her not to have surgery because of her weight, she had one round of hyaluronic injection without help and it was too expensive . She is seeing Dr. Yves Dill for pain     Iron deficiency anemia: last level was better, under the care of Dr. Cathie Hoops and had 5 iron infusions in the past but CBC has been back to normal, last ferritin was normal at 52 done 08/2022, last visit with GI was with Dr. Servando Snare - GI in 2020 .Last level normal range   Post-menopausal  bleeding : she had polyp removal and also endometrial biopsy done end of October 23 no longer having spotting or pain .She states no longer has to go back to see gyn       DDD lumbar spine and radiculitis: seen by Psyatrist , seeing Whitney FNP, she has MRI lumbar spine, steroid injection for trochanteric bursitis did not work, she also had PT . She is still taking Gabapentin and takes Tramadol every night but only prn during the day when the pain is intense, she also takes Methocarbamol also.  Under the care of Dr. Yves Dill   B12 deficiency: taking supplements , last level at goal.   Controlled gout: taking allopurinol , uric acid has been at goal, doing well     Patient Active Problem List   Diagnosis Date Noted   Greater trochanteric bursitis, left 06/18/2022   Gait instability 12/13/2020   Stage 3a chronic kidney disease (HCC) 12/13/2020   Cellulitis of right ankle 04/12/2020   Morbid obesity with BMI of 45.0-49.9, adult (HCC) 02/06/2020   History of gastritis 08/14/2019   Angiodysplasia of stomach and duodenum    Iron deficiency anemia 04/20/2018   Anemia, unspecified 04/12/2017   Lichen sclerosus of female  genitalia 06/29/2016   Primary osteoarthritis of both knees 04/07/2016   Special screening for malignant neoplasms, colon    Right thyroid nodule 06/26/2015   Asthma, intermittent 04/03/2015   Benign essential HTN 04/03/2015   Carpal tunnel syndrome 04/03/2015   Chronic kidney disease (CKD), stage II (mild) 04/03/2015   Controlled gout 04/03/2015   Arteriosclerosis of coronary artery 04/03/2015   Diabetes mellitus with renal manifestations, controlled (HCC) 04/03/2015   Dyslipidemia 04/03/2015   Edema of extremities 04/03/2015   Elevated sedimentation rate 04/03/2015   Knee pain 04/03/2015   Lumbar radiculitis 04/03/2015   Obstructive apnea 04/03/2015   Lumbosacral spondylosis 04/03/2015   Osteoarthritis, chronic 04/03/2015   Psoriasis 04/03/2015   Vitamin D deficiency  04/03/2015   Varicose veins 04/03/2015   Goiter 12/23/2014   Degeneration of intervertebral disc of lumbar region 08/23/2014   Corns and callosity 06/11/2009    Past Surgical History:  Procedure Laterality Date   APPENDECTOMY     CATARACT EXTRACTION W/PHACO Left 08/14/2020   Procedure: CATARACT EXTRACTION PHACO AND INTRAOCULAR LENS PLACEMENT (IOC) LEFT DIABETIC 3.56 00:59.6 6.0%;  Surgeon: Lockie Mola, MD;  Location: [facility];  Service: Ophthalmology;  Laterality: Left;  Diabetic   CATARACT EXTRACTION W/PHACO Right 09/04/2020   Procedure: CATARACT EXTRACTION PHACO AND INTRAOCULAR LENS PLACEMENT (IOC) RIGHT DIABETIC;  Surgeon: Lockie Mola, MD;  Location: [facility];  Service: Ophthalmology;  Laterality: Right;  4.72 1:03.2 7.4%   CHOLECYSTECTOMY  1977   COLONOSCOPY WITH PROPOFOL N/A 01/21/2016   Procedure: COLONOSCOPY WITH PROPOFOL;  Surgeon: Midge Minium, MD;  Location: [facility];  Service: Endoscopy;  Laterality: N/A;   DILATION AND CURETTAGE OF UTERUS     Due to Amenorrhea   ESOPHAGOGASTRODUODENOSCOPY (EGD) WITH PROPOFOL N/A 08/16/2018   Procedure: ESOPHAGOGASTRODUODENOSCOPY (EGD) WITH PROPOFOL;  Surgeon: Midge Minium, MD;  Location: [facility];  Service: Endoscopy;  Laterality: N/A;   HEMORRHOID BANDING  1980   HEMORROIDECTOMY     HERNIA REPAIR  2011   Temporal Area Excision Biopsy     For Birth Mark Changes, Negative Pathology   TONSILLECTOMY AND ADENOIDECTOMY      Family History  Problem Relation Age of Onset   Cancer Mother        brain tumor   Kidney disease Mother    Hypertension Mother    Heart disease Father    COPD Father    Hypertension Sister    Arthritis Sister    Cancer Sister    Heart murmur Sister    GER disease Sister    Lupus Sister    Diabetes Sister    Arthritis Sister    Osteopenia Sister    Heart disease Sister    Hypertension Sister    Breast cancer Maternal Aunt 28   Leukemia Maternal Aunt      Social History   Tobacco Use   Smoking status: Former    Current packs/day: 0.00    Average packs/day: 2.0 packs/day for 15.0 years (30.0 ttl pk-yrs)    Types: Cigarettes    Start date: 57    Quit date: 54    Years since quitting: 42.2   Smokeless tobacco: Never   Tobacco comments:    smoking cessation materials not required  Substance Use Topics   Alcohol use: Not Currently    Alcohol/week: 0.0 standard drinks of alcohol     Current Outpatient Medications:    acetaminophen (TYLENOL) 500 MG tablet, Take 1 tablet (500 mg total) by mouth every 6 (  six) hours as needed. (Patient taking differently: Take 1,000 mg by mouth every 8 (eight) hours.), Disp: 480 tablet, Rfl: 0   albuterol (VENTOLIN HFA) 108 (90 Base) MCG/ACT inhaler, Inhale 2 puffs into the lungs every 6 (six) hours as needed for wheezing or shortness of breath., Disp: 8 g, Rfl: 5   allopurinol (ZYLOPRIM) 300 MG tablet, Take 1 tablet (300 mg total) by mouth daily., Disp: 90 tablet, Rfl: 3   atorvastatin (LIPITOR) 20 MG tablet, TAKE 1 TABLET BY MOUTH DAILY FOR CHOLESTEROL, Disp: 90 tablet, Rfl: 3   benazepril (LOTENSIN) 5 MG tablet, Take 1 tablet (5 mg total) by mouth daily., Disp: 90 tablet, Rfl: 3   Blood Pressure KIT, 1 each by Does not apply route daily., Disp: 1 kit, Rfl: 0   dapagliflozin propanediol (FARXIGA) 10 MG TABS tablet, Take 1 tablet (10 mg total) by mouth daily before breakfast., Disp: 90 tablet, Rfl: 3   Dulaglutide (TRULICITY) 3 MG/0.5ML SOPN, Inject 3 mg as directed once a week., Disp: 12 mL, Rfl: 3   furosemide (LASIX) 40 MG tablet, Take 1 tablet (40 mg total) by mouth daily., Disp: 90 tablet, Rfl: 3   gabapentin (NEURONTIN) 300 MG capsule, TAKE 1 CAPSULE BY MOUTH IN THE MORNING, 1 CAPSULE IN THE EVENING AND 2 CAPSULES AT NIGHT, Disp: 360 capsule, Rfl: 3   glucose blood (ONETOUCH VERIO) test strip, USE AS DIRECTED, Disp: 50 each, Rfl: 1   Lancets (ONETOUCH ULTRASOFT) lancets, Use as instructed, Disp:  100 each, Rfl: 12   metFORMIN (GLUCOPHAGE-XR) 750 MG 24 hr tablet, Take 1 tablet (750 mg total) by mouth daily with breakfast., Disp: 90 tablet, Rfl: 1   methocarbamol (ROBAXIN) 500 MG tablet, Take 500 mg by mouth 2 (two) times daily., Disp: , Rfl:    potassium chloride SA (KLOR-CON M) 20 MEQ tablet, TAKE 1 TABLET BY MOUTH DAILY, Disp: 90 tablet, Rfl: 3   traMADol (ULTRAM) 50 MG tablet, Take 25-50 mg by mouth 2 (two) times daily as needed., Disp: , Rfl:    triamcinolone ointment (KENALOG) 0.1 %, APPLY TOPICALLY TWICE DAILY AS NEEDED. GENERIC EQUIVALENT FOR KENALOG, Disp: 90 g, Rfl: 0   vitamin B-12 (CYANOCOBALAMIN) 500 MCG tablet, Take 500 mcg by mouth daily., Disp: , Rfl:    Vitamin D, Cholecalciferol, 25 MCG (1000 UT) TABS, Take 1 capsule by mouth daily., Disp: 90 tablet, Rfl: 1   VOLTAREN 1 % GEL, Apply 4 g topically 4 (four) times daily., Disp: 300 g, Rfl: 3  Allergies  Allergen Reactions   Codeine Hives and Nausea And Vomiting    I personally reviewed active problem list, medication list, allergies, family history with the patient/caregiver today.   ROS  Ten systems reviewed and is negative except as mentioned in HPI    Objective  Vitals:   01/05/24 1318  BP: 104/66  Pulse: 88  Resp: 16  SpO2: 97%  Weight: 252 lb 12.8 oz (114.7 kg)  Height: 5\' 1"  (1.549 m)    Body mass index is 47.77 kg/m.  Physical Exam  Constitutional: Patient appears well-developed and well-nourished. Obese  No distress.  HEENT: head atraumatic, normocephalic, pupils equal and reactive to light, neck supple Cardiovascular: Normal rate, regular rhythm and normal heart sounds.  No murmur heard.  Trace BLE edema. Pulmonary/Chest: Effort normal and breath sounds normal. No respiratory distress. Abdominal: Soft.  There is no tenderness. Muscular skeletal : using walker, slow gait  Psychiatric: Patient has a normal mood and affect. behavior is normal.  Judgment and thought content normal.    Diabetic  Foot Exam:     PHQ2/9:    01/05/2024    1:14 PM 09/07/2023    1:12 PM 07/28/2023   10:22 AM 05/20/2023    3:54 PM 04/27/2023    1:44 PM  Depression screen PHQ 2/9  Decreased Interest 0 0 0 0 0  Down, Depressed, Hopeless 0 0 0 0 0  PHQ - 2 Score 0 0 0 0 0  Altered sleeping 0 0 0  0  Tired, decreased energy 0 0 0  0  Change in appetite 0 0 0  0  Feeling bad or failure about yourself  0 0 0  0  Trouble concentrating 0 0 0  0  Moving slowly or fidgety/restless 0 0 0  0  Suicidal thoughts 0 0 0  0  PHQ-9 Score 0 0 0  0  Difficult doing work/chores Not difficult at all        phq 9 is negative  Fall Risk:    01/05/2024    1:14 PM 09/07/2023    1:11 PM 07/28/2023   10:22 AM 05/16/2023    1:40 PM 04/27/2023    1:44 PM  Fall Risk   Falls in the past year? 0 0 0 0 0  Number falls in past yr: 0 0 0    Injury with Fall? 0 0 0 0   Risk for fall due to : No Fall Risks Impaired balance/gait No Fall Risks No Fall Risks No Fall Risks  Follow up Falls prevention discussed;Education provided;Falls evaluation completed Falls prevention discussed Falls prevention discussed Education provided;Falls prevention discussed Falls prevention discussed     Assessment & Plan  1. Controlled type 2 diabetes mellitus with stage 3 chronic kidney disease, without long-term current use of insulin (HCC) (Primary)  Continue Farxiga, Trulicity  and Metformin, A1C is down from 7.4 % to 7.1 %   2. Morbid obesity with BMI of 45.0-49.9, adult [facility]  Discussed with the patient the risk posed by an increased BMI. Discussed importance of portion control, calorie counting and at least 150 minutes of physical activity weekly. Avoid sweet beverages and drink more water. Eat at least 6 servings of fruit and vegetables daily    3. Stage 3a chronic kidney disease (HCC)  Mild drop last visit but she does not want to have labs done today   4. OSA on CPAP  Compliant   5. Controlled gout  Doing well   6. B12  deficiency  Continue supplementation   7. Asthma, mild intermittent, well-controlled  Using inhaler prn   8. Gait instability  Using walker , stable   9. Bilateral edema of lower extremity  We will change lasix to 20 mg to take once or twice a day since bp is low , stop ACE    11. Chronic pain syndrome    under the care of PMR, taking tramadol and Tylenol

## 2024-01-18 DIAGNOSIS — E119 Type 2 diabetes mellitus without complications: Secondary | ICD-10-CM | POA: Diagnosis not present

## 2024-02-08 ENCOUNTER — Other Ambulatory Visit: Payer: Self-pay | Admitting: Family Medicine

## 2024-02-08 DIAGNOSIS — L409 Psoriasis, unspecified: Secondary | ICD-10-CM

## 2024-02-17 DIAGNOSIS — E119 Type 2 diabetes mellitus without complications: Secondary | ICD-10-CM | POA: Diagnosis not present

## 2024-02-18 DIAGNOSIS — I10 Essential (primary) hypertension: Secondary | ICD-10-CM | POA: Diagnosis not present

## 2024-02-18 DIAGNOSIS — F5101 Primary insomnia: Secondary | ICD-10-CM | POA: Diagnosis not present

## 2024-02-18 DIAGNOSIS — G4733 Obstructive sleep apnea (adult) (pediatric): Secondary | ICD-10-CM | POA: Diagnosis not present

## 2024-03-19 DIAGNOSIS — E119 Type 2 diabetes mellitus without complications: Secondary | ICD-10-CM | POA: Diagnosis not present

## 2024-03-20 DIAGNOSIS — F5101 Primary insomnia: Secondary | ICD-10-CM | POA: Diagnosis not present

## 2024-03-20 DIAGNOSIS — G4733 Obstructive sleep apnea (adult) (pediatric): Secondary | ICD-10-CM | POA: Diagnosis not present

## 2024-03-20 DIAGNOSIS — I10 Essential (primary) hypertension: Secondary | ICD-10-CM | POA: Diagnosis not present

## 2024-03-31 DIAGNOSIS — H353132 Nonexudative age-related macular degeneration, bilateral, intermediate dry stage: Secondary | ICD-10-CM | POA: Diagnosis not present

## 2024-03-31 DIAGNOSIS — H179 Unspecified corneal scar and opacity: Secondary | ICD-10-CM | POA: Diagnosis not present

## 2024-03-31 DIAGNOSIS — E119 Type 2 diabetes mellitus without complications: Secondary | ICD-10-CM | POA: Diagnosis not present

## 2024-03-31 DIAGNOSIS — H18452 Nodular corneal degeneration, left eye: Secondary | ICD-10-CM | POA: Diagnosis not present

## 2024-04-10 DIAGNOSIS — H18452 Nodular corneal degeneration, left eye: Secondary | ICD-10-CM | POA: Diagnosis not present

## 2024-04-13 DIAGNOSIS — H18452 Nodular corneal degeneration, left eye: Secondary | ICD-10-CM | POA: Diagnosis not present

## 2024-04-17 DIAGNOSIS — H18452 Nodular corneal degeneration, left eye: Secondary | ICD-10-CM | POA: Diagnosis not present

## 2024-04-18 DIAGNOSIS — E119 Type 2 diabetes mellitus without complications: Secondary | ICD-10-CM | POA: Diagnosis not present

## 2024-04-19 DIAGNOSIS — G4733 Obstructive sleep apnea (adult) (pediatric): Secondary | ICD-10-CM | POA: Diagnosis not present

## 2024-04-19 DIAGNOSIS — F5101 Primary insomnia: Secondary | ICD-10-CM | POA: Diagnosis not present

## 2024-04-19 DIAGNOSIS — I10 Essential (primary) hypertension: Secondary | ICD-10-CM | POA: Diagnosis not present

## 2024-05-05 ENCOUNTER — Ambulatory Visit: Admitting: Family Medicine

## 2024-05-05 ENCOUNTER — Encounter: Payer: Self-pay | Admitting: Family Medicine

## 2024-05-05 VITALS — BP 106/66 | HR 72 | Resp 16 | Ht 61.0 in | Wt 247.8 lb

## 2024-05-05 DIAGNOSIS — Z6841 Body Mass Index (BMI) 40.0 and over, adult: Secondary | ICD-10-CM

## 2024-05-05 DIAGNOSIS — N1831 Chronic kidney disease, stage 3a: Secondary | ICD-10-CM

## 2024-05-05 DIAGNOSIS — R5383 Other fatigue: Secondary | ICD-10-CM

## 2024-05-05 DIAGNOSIS — G894 Chronic pain syndrome: Secondary | ICD-10-CM

## 2024-05-05 DIAGNOSIS — R400 Somnolence: Secondary | ICD-10-CM

## 2024-05-05 DIAGNOSIS — N39492 Postural (urinary) incontinence: Secondary | ICD-10-CM

## 2024-05-05 DIAGNOSIS — M109 Gout, unspecified: Secondary | ICD-10-CM | POA: Diagnosis not present

## 2024-05-05 DIAGNOSIS — N2581 Secondary hyperparathyroidism of renal origin: Secondary | ICD-10-CM

## 2024-05-05 DIAGNOSIS — E1122 Type 2 diabetes mellitus with diabetic chronic kidney disease: Secondary | ICD-10-CM | POA: Diagnosis not present

## 2024-05-05 DIAGNOSIS — G4733 Obstructive sleep apnea (adult) (pediatric): Secondary | ICD-10-CM

## 2024-05-05 DIAGNOSIS — N183 Chronic kidney disease, stage 3 unspecified: Secondary | ICD-10-CM | POA: Diagnosis not present

## 2024-05-05 DIAGNOSIS — E785 Hyperlipidemia, unspecified: Secondary | ICD-10-CM

## 2024-05-05 LAB — POCT GLYCOSYLATED HEMOGLOBIN (HGB A1C): Hemoglobin A1C: 7.3 % — AB (ref 4.0–5.6)

## 2024-05-05 MED ORDER — ATORVASTATIN CALCIUM 20 MG PO TABS: ORAL_TABLET | ORAL | 3 refills | Status: AC

## 2024-05-05 MED ORDER — METFORMIN HCL ER 750 MG PO TB24: 750.0000 mg | ORAL_TABLET | Freq: Every day | ORAL | 1 refills | Status: AC

## 2024-05-05 MED ORDER — ALLOPURINOL 100 MG PO TABS: 100.0000 mg | ORAL_TABLET | Freq: Two times a day (BID) | ORAL | 3 refills | Status: AC

## 2024-05-05 NOTE — Progress Notes (Signed)
 Name: Amber Avery   MRN: [id number]    DOB: 1946-05-08   Date:05/05/2024       Progress Note  Subjective  Chief Complaint  Chief Complaint  Patient presents with   Medical Management of Chronic Issues   Discussed the use of AI scribe software for clinical note transcription with the patient, who gave verbal consent to proceed.  History of Present Illness Amber Avery is a 78 year old female with type 2 diabetes and chronic kidney disease who presents for a four-month follow-up.  Her hemoglobin A1c has increased slightly from 7.1 to 7.3. She is currently on Trulicity  3 mg, Farxiga  10 mg, and metformin  750 mg once daily. Despite adherence to her diet, she questions the efficacy of her medications. She has lost almost five pounds since her last visit. No excessive hunger, thirst, or increased urination frequency beyond her usual pattern.  She experiences urge incontinence, which has worsened. She has tried two different medications without success and has not pursued pelvic floor training. She describes urgency and dribbling when standing up, and sometimes has accidents if she doesn't reach the bathroom in time. She uses pull-ups but finds them expensive and has had issues with sizing. No burning sensation during urination.  She reports excessive daytime sleepiness, often falling asleep in her chair, which concerns her. She has obstructive sleep apnea and uses a CPAP machine nightly, with recent changes to her supplies. She wakes up at least three times a night to urinate, which has not changed recently. She is concerned about her sleep quality and the impact of her sleep apnea on her daily life.  She has chronic pain, primarily in her hip, and is under the care of Dr. Claudie for this. She takes tramadol and Robaxin  for pain management. Her hip pain is significant, and she plans to request another injection for relief.  She has a history of gout but has not had a recent episode. She takes  allopurinol  300 mg, which is being adjusted to 200 mg due to her kidney function. She has not experienced any recent gout attacks.  She continues to take Farxiga  for both her kidney and diabetes management. Her GFR has dropped to 38.    Patient Active Problem List   Diagnosis Date Noted   Chronic pain syndrome 01/05/2024   B12 deficiency 01/05/2024   Secondary hyperparathyroidism of renal origin (HCC) 01/05/2024   Greater trochanteric bursitis, left 06/18/2022   Gait instability 12/13/2020   Stage 3a chronic kidney disease (HCC) 12/13/2020   Cellulitis of right ankle 04/12/2020   Morbid obesity with BMI of 45.0-49.9, adult (HCC) 02/06/2020   History of gastritis 08/14/2019   Angiodysplasia of stomach and duodenum    Iron  deficiency anemia 04/20/2018   Anemia, unspecified 04/12/2017   Lichen sclerosus of female genitalia 06/29/2016   Primary osteoarthritis of both knees 04/07/2016   Special screening for malignant neoplasms, colon    Right thyroid  nodule 06/26/2015   Asthma, well controlled 04/03/2015   Benign essential HTN 04/03/2015   Carpal tunnel syndrome 04/03/2015   Chronic kidney disease (CKD), stage II (mild) 04/03/2015   Controlled gout 04/03/2015   Arteriosclerosis of coronary artery 04/03/2015   Diabetes mellitus with renal manifestations, controlled (HCC) 04/03/2015   Dyslipidemia 04/03/2015   Bilateral edema of lower extremity 04/03/2015   Elevated sedimentation rate 04/03/2015   Knee pain 04/03/2015   Lumbar radiculitis 04/03/2015   OSA on CPAP 04/03/2015   Lumbosacral spondylosis 04/03/2015   Osteoarthritis,  chronic 04/03/2015   Psoriasis 04/03/2015   Vitamin D  deficiency 04/03/2015   Varicose veins 04/03/2015   Goiter 12/23/2014   Degeneration of intervertebral disc of lumbar region 08/23/2014   Corns and callosity 06/11/2009    Past Surgical History:  Procedure Laterality Date   APPENDECTOMY     CATARACT EXTRACTION W/PHACO Left 08/14/2020   Procedure:  CATARACT EXTRACTION PHACO AND INTRAOCULAR LENS PLACEMENT (IOC) LEFT DIABETIC 3.56 00:59.6 6.0%;  Surgeon: Mittie Gaskin, MD;  Location: [facility];  Service: Ophthalmology;  Laterality: Left;  Diabetic   CATARACT EXTRACTION W/PHACO Right 09/04/2020   Procedure: CATARACT EXTRACTION PHACO AND INTRAOCULAR LENS PLACEMENT (IOC) RIGHT DIABETIC;  Surgeon: Mittie Gaskin, MD;  Location: [facility];  Service: Ophthalmology;  Laterality: Right;  4.72 1:03.2 7.4%   CHOLECYSTECTOMY  10/20/1975   COLONOSCOPY WITH PROPOFOL  N/A 01/21/2016   Procedure: COLONOSCOPY WITH PROPOFOL ;  Surgeon: Rogelia Copping, MD;  Location: [facility];  Service: Endoscopy;  Laterality: N/A;   DILATION AND CURETTAGE OF UTERUS     Due to Amenorrhea   ESOPHAGOGASTRODUODENOSCOPY (EGD) WITH PROPOFOL  N/A 08/16/2018   Procedure: ESOPHAGOGASTRODUODENOSCOPY (EGD) WITH PROPOFOL ;  Surgeon: Copping Rogelia, MD;  Location: [facility];  Service: Endoscopy;  Laterality: N/A;   EYE SURGERY     HEMORRHOID BANDING  10/19/1978   HEMORROIDECTOMY     HERNIA REPAIR  10/19/2009   Temporal Area Excision Biopsy     For Birth Mark Changes, Negative Pathology   TONSILLECTOMY AND ADENOIDECTOMY      Family History  Problem Relation Age of Onset   Cancer Mother        brain tumor   Kidney disease Mother    Hypertension Mother    Heart disease Father    COPD Father    Hearing loss Father    Hypertension Sister    Arthritis Sister    Cancer Sister    Heart murmur Sister    GER disease Sister    Lupus Sister    Diabetes Sister    Arthritis Sister    Osteopenia Sister    Heart disease Sister    Hypertension Sister    Hearing loss Sister    Obesity Sister    Breast cancer Maternal Aunt 19   Leukemia Maternal Aunt     Social History   Tobacco Use   Smoking status: Former    Current packs/day: 0.00    Average packs/day: 2.0 packs/day for 15.0 years (30.0 ttl pk-yrs)    Types: Cigarettes    Start date:  11    Quit date: 84    Years since quitting: 42.5   Smokeless tobacco: Never   Tobacco comments:    smoking cessation materials not required  Substance Use Topics   Alcohol use: Not Currently    Alcohol/week: 0.0 standard drinks of alcohol     Current Outpatient Medications:    acetaminophen  (TYLENOL ) 500 MG tablet, Take 1 tablet (500 mg total) by mouth every 6 (six) hours as needed. (Patient taking differently: Take 1,000 mg by mouth every 8 (eight) hours.), Disp: 480 tablet, Rfl: 0   albuterol  (VENTOLIN  HFA) 108 (90 Base) MCG/ACT inhaler, Inhale 2 puffs into the lungs every 6 (six) hours as needed for wheezing or shortness of breath., Disp: 8 g, Rfl: 5   allopurinol  (ZYLOPRIM ) 300 MG tablet, Take 1 tablet (300 mg total) by mouth daily., Disp: 90 tablet, Rfl: 3   atorvastatin  (LIPITOR) 20 MG tablet, TAKE 1 TABLET BY MOUTH DAILY FOR CHOLESTEROL,  Disp: 90 tablet, Rfl: 3   benazepril  (LOTENSIN ) 5 MG tablet, Take 5 mg by mouth daily., Disp: , Rfl:    Blood Pressure KIT, 1 each by Does not apply route daily., Disp: 1 kit, Rfl: 0   dapagliflozin  propanediol (FARXIGA ) 10 MG TABS tablet, Take 1 tablet (10 mg total) by mouth daily before breakfast., Disp: 90 tablet, Rfl: 3   Dulaglutide  (TRULICITY ) 3 MG/0.5ML SOPN, Inject 3 mg as directed once a week., Disp: 12 mL, Rfl: 3   furosemide  (LASIX ) 20 MG tablet, Take 1 tablet (20 mg total) by mouth 2 (two) times daily. Second dose around noon, Disp: 180 tablet, Rfl: 1   gabapentin  (NEURONTIN ) 300 MG capsule, TAKE 1 CAPSULE BY MOUTH IN THE MORNING, 1 CAPSULE IN THE EVENING AND 2 CAPSULES AT NIGHT, Disp: 360 capsule, Rfl: 3   glucose blood (ONETOUCH VERIO) test strip, USE AS DIRECTED, Disp: 50 each, Rfl: 1   Lancets (ONETOUCH ULTRASOFT) lancets, Use as instructed, Disp: 100 each, Rfl: 12   metFORMIN  (GLUCOPHAGE -XR) 750 MG 24 hr tablet, Take 1 tablet (750 mg total) by mouth daily with breakfast., Disp: 90 tablet, Rfl: 1   methocarbamol  (ROBAXIN ) 500 MG  tablet, Take 500 mg by mouth 2 (two) times daily., Disp: , Rfl:    potassium chloride  SA (KLOR-CON  M) 20 MEQ tablet, TAKE 1 TABLET BY MOUTH DAILY, Disp: 90 tablet, Rfl: 3   traMADol (ULTRAM) 50 MG tablet, Take 25-50 mg by mouth 2 (two) times daily as needed., Disp: , Rfl:    triamcinolone  ointment (KENALOG ) 0.1 %, APPLY TOPICALLY TWICE DAILY AS NEEDED. GENERIC EQUIVALENT FOR KENALOG , Disp: 90 g, Rfl: 0   vitamin B-12 (CYANOCOBALAMIN ) 500 MCG tablet, Take 500 mcg by mouth daily., Disp: , Rfl:    Vitamin D , Cholecalciferol , 25 MCG (1000 UT) TABS, Take 1 capsule by mouth daily., Disp: 90 tablet, Rfl: 1   VOLTAREN  1 % GEL, Apply 4 g topically 4 (four) times daily., Disp: 300 g, Rfl: 3  Allergies  Allergen Reactions   Codeine Hives and Nausea And Vomiting    I personally reviewed active problem list, medication list, allergies with the patient/caregiver today.   ROS  Ten systems reviewed and is negative except as mentioned in HPI    Objective Physical Exam VITALS: BP- 106/60 CONSTITUTIONAL: Patient appears well-developed and well-nourished.  No distress. HEENT: Head atraumatic, normocephalic, neck supple. CARDIOVASCULAR: Normal rate, regular rhythm and normal heart sounds.  No murmur heard. No BLE edema. PULMONARY: Effort normal and breath sounds normal. No respiratory distress. ABDOMINAL: There is no tenderness or distention. MUSCULOSKELETAL: Normal gait. Without gross motor or sensory deficit. PSYCHIATRIC: Patient has a normal mood and affect. behavior is normal. Judgment and thought content normal.  Vitals:   05/05/24 1330  BP: 106/66  Pulse: 72  Resp: 16  SpO2: 99%  Weight: 247 lb 12.8 oz (112.4 kg)  Height: 5' 1 (1.549 m)    Body mass index is 46.82 kg/m.  Recent Results (from the past 2160 hours)  POCT glycosylated hemoglobin (Hb A1C)     Status: Abnormal   Collection Time: 05/05/24  1:38 PM  Result Value Ref Range   Hemoglobin A1C 7.3 (A) 4.0 - 5.6 %   HbA1c POC  (<> result, manual entry)     HbA1c, POC (prediabetic range)     HbA1c, POC (controlled diabetic range)      Diabetic Foot Exam:     PHQ2/9:    05/05/2024    1:18 PM  01/05/2024    1:14 PM 09/07/2023    1:12 PM 07/28/2023   10:22 AM 05/20/2023    3:54 PM  Depression screen PHQ 2/9  Decreased Interest 0 0 0 0 0  Down, Depressed, Hopeless 0 0 0 0 0  PHQ - 2 Score 0 0 0 0 0  Altered sleeping  0 0 0   Tired, decreased energy  0 0 0   Change in appetite  0 0 0   Feeling bad or failure about yourself   0 0 0   Trouble concentrating  0 0 0   Moving slowly or fidgety/restless  0 0 0   Suicidal thoughts  0 0 0   PHQ-9 Score  0 0 0   Difficult doing work/chores  Not difficult at all       phq 9 is negative  Fall Risk:    05/05/2024    1:18 PM 01/05/2024    1:14 PM 09/07/2023    1:11 PM 07/28/2023   10:22 AM 05/16/2023    1:40 PM  Fall Risk   Falls in the past year? 0 0 0 0 0  Number falls in past yr: 0 0 0 0   Injury with Fall? 0 0 0 0 0  Risk for fall due to : No Fall Risks No Fall Risks Impaired balance/gait No Fall Risks No Fall Risks  Follow up Falls evaluation completed Falls prevention discussed;Education provided;Falls evaluation completed Falls prevention discussed Falls prevention discussed Education provided;Falls prevention discussed     Assessment & Plan Type 2 diabetes mellitus with stage 3 chronic kidney disease, dyslipidemia A1c increased to 7.3, indicating slight worsening of glycemic control. GFR drop limits metformin  increase. Weight loss suggests regimen effectiveness. - Continue Trulicity  3 mg, Farxiga  10 mg, metformin  750 mg daily. - Consider increasing Trulicity  to 4.5 mg pending assistance program approval. - Monitor blood glucose levels regularly. - Check kidney function today.  Secondary hyperparathyroidism of chronic kidney disease Secondary hyperparathyroidism associated with stage 3 chronic kidney disease. Farxiga  beneficial for kidney function and  diabetes control. - Continue Farxiga  10 mg daily.  Gout, currently controlled Gout controlled with allopurinol . Plan to reduce dose due to decreased kidney function. - Reduce allopurinol  dose to 200 mg daily. - Monitor for gout flare-ups and adjust dose if necessary.  Positional incontinence  Persistent urge and positional incontinence affecting quality of life. Previous medications ineffective. - Contact urologist for further management. - Discuss potential interventions such as pelvic floor training or Botox with urologist.  Obstructive sleep apnea with daytime somnolence Daytime somnolence possibly related to obstructive sleep apnea. CPAP compliance confirmed, but excessive daytime sleepiness persists. - Check CBC and TSH to rule out anemia and thyroid  dysfunction. - Consider home sleep study to reassess CPAP settings.  Chronic pain syndrome with osteoarthritis and trochanteric bursitis Chronic pain primarily in the hip with trochanteric bursitis. Persistent pain, especially at night. - Continue tramadol and Robaxin . - Request corticosteroid injection for hip pain.  Dyslipidemia Dyslipidemia managed with atorvastatin . No side effects reported. - Continue atorvastatin  as prescribed.  Edema, lower extremity Lower extremity edema managed with torsemide . She reports reducing dose due to frequent urination. - Take half dose of torsemide  in the morning. - Elevate legs to manage edema.

## 2024-05-06 ENCOUNTER — Other Ambulatory Visit: Payer: Self-pay | Admitting: Family Medicine

## 2024-05-06 DIAGNOSIS — I10 Essential (primary) hypertension: Secondary | ICD-10-CM

## 2024-05-08 ENCOUNTER — Other Ambulatory Visit: Payer: Self-pay | Admitting: Pharmacist

## 2024-05-08 ENCOUNTER — Other Ambulatory Visit: Payer: Self-pay | Admitting: Family Medicine

## 2024-05-08 ENCOUNTER — Ambulatory Visit: Payer: Self-pay | Admitting: Family Medicine

## 2024-05-08 DIAGNOSIS — E1122 Type 2 diabetes mellitus with diabetic chronic kidney disease: Secondary | ICD-10-CM

## 2024-05-08 DIAGNOSIS — I10 Essential (primary) hypertension: Secondary | ICD-10-CM

## 2024-05-08 DIAGNOSIS — N1831 Chronic kidney disease, stage 3a: Secondary | ICD-10-CM

## 2024-05-08 MED ORDER — TRULICITY 4.5 MG/0.5ML ~~LOC~~ SOAJ: 4.5000 mg | SUBCUTANEOUS | 2 refills | Status: AC

## 2024-05-09 ENCOUNTER — Other Ambulatory Visit: Payer: Self-pay

## 2024-05-09 DIAGNOSIS — N1831 Chronic kidney disease, stage 3a: Secondary | ICD-10-CM

## 2024-05-15 DIAGNOSIS — H18452 Nodular corneal degeneration, left eye: Secondary | ICD-10-CM | POA: Diagnosis not present

## 2024-05-15 DIAGNOSIS — Z961 Presence of intraocular lens: Secondary | ICD-10-CM | POA: Diagnosis not present

## 2024-05-15 DIAGNOSIS — H353132 Nonexudative age-related macular degeneration, bilateral, intermediate dry stage: Secondary | ICD-10-CM | POA: Diagnosis not present

## 2024-05-15 LAB — TEST AUTHORIZATION

## 2024-05-15 LAB — BASIC METABOLIC PANEL WITHOUT GFR
BUN/Creatinine Ratio: 30 (calc) — ABNORMAL HIGH (ref 6–22)
BUN: 40 mg/dL — ABNORMAL HIGH (ref 7–25)
CO2: 25 mmol/L (ref 20–32)
Calcium: 9.9 mg/dL (ref 8.6–10.4)
Chloride: 103 mmol/L (ref 98–110)
Creat: 1.32 mg/dL — ABNORMAL HIGH (ref 0.60–1.00)
Glucose, Bld: 122 mg/dL — ABNORMAL HIGH (ref 65–99)
Potassium: 4.7 mmol/L (ref 3.5–5.3)
Sodium: 140 mmol/L (ref 135–146)

## 2024-05-15 LAB — URIC ACID: Uric Acid, Serum: 4.3 mg/dL (ref 2.5–7.0)

## 2024-05-15 LAB — CBC WITH DIFFERENTIAL/PLATELET
Absolute Lymphocytes: 1508 {cells}/uL (ref 850–3900)
Absolute Monocytes: 811 {cells}/uL (ref 200–950)
Basophils Absolute: 27 {cells}/uL (ref 0–200)
Basophils Relative: 0.4 %
Eosinophils Absolute: 221 {cells}/uL (ref 15–500)
Eosinophils Relative: 3.3 %
HCT: 45 % (ref 35.0–45.0)
Hemoglobin: 14.6 g/dL (ref 11.7–15.5)
MCH: 31.3 pg (ref 27.0–33.0)
MCHC: 32.4 g/dL (ref 32.0–36.0)
MCV: 96.6 fL (ref 80.0–100.0)
MPV: 11 fL (ref 7.5–12.5)
Monocytes Relative: 12.1 %
Neutro Abs: 4134 {cells}/uL (ref 1500–7800)
Neutrophils Relative %: 61.7 %
Platelets: 215 10*3/uL (ref 140–400)
RBC: 4.66 Million/uL (ref 3.80–5.10)
RDW: 14.2 % (ref 11.0–15.0)
Total Lymphocyte: 22.5 %
WBC: 6.7 10*3/uL (ref 3.8–10.8)

## 2024-05-15 LAB — ESTIMATED GLOMERULAR FILTRATION RATE W CREATININE,CYSTATIN C
CYSTATIN C: 2.65 mg/L — ABNORMAL HIGH (ref 0.52–1.10)
Creat: 1.43 mg/dL — ABNORMAL HIGH (ref 0.60–1.00)
eGFR (CREATININE): 38 mL/min/{1.73_m2} — ABNORMAL LOW
eGFR (CREATININE, CYSTATIN C): 26 mL/min/{1.73_m2} — ABNORMAL LOW
eGFR (CYSTATIN C): 19 mL/min/{1.73_m2} — ABNORMAL LOW

## 2024-05-15 LAB — TSH: TSH: 2.05 m[IU]/L (ref 0.40–4.50)

## 2024-05-19 DIAGNOSIS — E119 Type 2 diabetes mellitus without complications: Secondary | ICD-10-CM | POA: Diagnosis not present

## 2024-05-25 ENCOUNTER — Ambulatory Visit: Payer: Medicare Other

## 2024-05-25 DIAGNOSIS — Z0000 Encounter for general adult medical examination without abnormal findings: Secondary | ICD-10-CM

## 2024-05-25 DIAGNOSIS — Z1231 Encounter for screening mammogram for malignant neoplasm of breast: Secondary | ICD-10-CM

## 2024-05-25 NOTE — Progress Notes (Signed)
 Subjective:   Amber Avery is a 78 y.o. who presents for a Medicare Wellness preventive visit.  As a reminder, Annual Wellness Visits don't include a physical exam, and some assessments may be limited, especially if this visit is performed virtually. We may recommend an in-person follow-up visit with your provider if needed.  Visit Complete: Virtual I connected with  Amber Avery on 05/25/24 by a audio enabled telemedicine application and verified that I am speaking with the correct person using two identifiers.  Patient Location: Home  Provider Location: Home Office  I discussed the limitations of evaluation and management by telemedicine. The patient expressed understanding and agreed to proceed.  Vital Signs: Because this visit was a virtual/telehealth visit, some criteria may be missing or patient reported. Any vitals not documented were not able to be obtained and vitals that have been documented are patient reported.  VideoDeclined- This patient declined Librarian, academic. Therefore the visit was completed with audio only.  Persons Participating in Visit: Patient.  AWV Questionnaire: No: Patient Medicare AWV questionnaire was not completed prior to this visit.  Cardiac Risk Factors include: advanced age (>26men, >46 women);dyslipidemia;hypertension;diabetes mellitus;obesity (BMI >30kg/m2);sedentary lifestyle     Objective:    Today's Vitals   05/25/24 1522  PainSc: 4    There is no height or weight on file to calculate BMI.     05/25/2024    3:32 PM 05/20/2023    3:55 PM 12/21/2021    9:29 AM 06/24/2021    3:56 PM 09/04/2020    7:06 AM 08/14/2020    6:34 AM 06/20/2019    3:51 PM  Advanced Directives  Does Patient Have a Medical Advance Directive? Yes Yes No Yes Yes Yes Yes  Type of Estate agent of [place];Living will Healthcare Power of [place];Living will  Healthcare Power of [place];Living will Healthcare Power  of [place];Living will Healthcare Power of [place];Living will Healthcare Power of [place];Living will  Does patient want to make changes to medical advance directive? No - Patient declined    No - Patient declined No - Patient declined   Copy of Healthcare Power of Attorney in Chart? Yes - validated most recent copy scanned in chart (See row information)   Yes - validated most recent copy scanned in chart (See row information) Yes - validated most recent copy scanned in chart (See row information) Yes - validated most recent copy scanned in chart (See row information) Yes - validated most recent copy scanned in chart (See row information)  Would patient like information on creating a medical advance directive?   No - Patient declined        Current Medications (verified) Outpatient Encounter Medications as of 05/25/2024  Medication Sig   acetaminophen  (TYLENOL ) 500 MG tablet Take 1 tablet (500 mg total) by mouth every 6 (six) hours as needed.   albuterol  (VENTOLIN  HFA) 108 (90 Base) MCG/ACT inhaler Inhale 2 puffs into the lungs every 6 (six) hours as needed for wheezing or shortness of breath.   allopurinol  (ZYLOPRIM ) 100 MG tablet Take 1 tablet (100 mg total) by mouth 2 (two) times daily.   atorvastatin  (LIPITOR) 20 MG tablet TAKE 1 TABLET BY MOUTH DAILY FOR CHOLESTEROL   Blood Pressure KIT 1 each by Does not apply route daily.   dapagliflozin  propanediol (FARXIGA ) 10 MG TABS tablet Take 1 tablet (10 mg total) by mouth daily before breakfast.   Dulaglutide  (TRULICITY ) 4.5 MG/0.5ML SOAJ Inject 4.5 mg as directed  once a week.   furosemide  (LASIX ) 20 MG tablet Take 1 tablet (20 mg total) by mouth 2 (two) times daily. Second dose around noon   gabapentin  (NEURONTIN ) 300 MG capsule TAKE 1 CAPSULE BY MOUTH IN THE MORNING, 1 CAPSULE IN THE EVENING AND 2 CAPSULES AT NIGHT   glucose blood (ONETOUCH VERIO) test strip USE AS DIRECTED   Lancets (ONETOUCH ULTRASOFT) lancets Use as instructed   metFORMIN   (GLUCOPHAGE -XR) 750 MG 24 hr tablet Take 1 tablet (750 mg total) by mouth daily with breakfast.   methocarbamol  (ROBAXIN ) 500 MG tablet Take 500 mg by mouth 2 (two) times daily.   potassium chloride  SA (KLOR-CON  M) 20 MEQ tablet TAKE 1 TABLET BY MOUTH DAILY   traMADol (ULTRAM) 50 MG tablet Take 25-50 mg by mouth 2 (two) times daily as needed.   triamcinolone  ointment (KENALOG ) 0.1 % APPLY TOPICALLY TWICE DAILY AS NEEDED. GENERIC EQUIVALENT FOR KENALOG    vitamin B-12 (CYANOCOBALAMIN ) 500 MCG tablet Take 500 mcg by mouth daily.   Vitamin D , Cholecalciferol , 25 MCG (1000 UT) TABS Take 1 capsule by mouth daily.   VOLTAREN  1 % GEL Apply 4 g topically 4 (four) times daily.   No facility-administered encounter medications on file as of 05/25/2024.    Allergies (verified) Codeine   History: Past Medical History:  Diagnosis Date   Allergy    Arthritis    Asthma    Diabetes mellitus without complication (HCC)    Gout    Hyperlipidemia    Hypertension    Iron  deficiency anemia 04/20/2018   Oxygen  deficiency    PONV (postoperative nausea and vomiting)    after hemorroid surgery   Shortness of breath dyspnea    Sleep apnea    Past Surgical History:  Procedure Laterality Date   APPENDECTOMY     CATARACT EXTRACTION W/PHACO Left 08/14/2020   Procedure: CATARACT EXTRACTION PHACO AND INTRAOCULAR LENS PLACEMENT (IOC) LEFT DIABETIC 3.56 00:59.6 6.0%;  Surgeon: Mittie Gaskin, MD;  Location: [facility];  Service: Ophthalmology;  Laterality: Left;  Diabetic   CATARACT EXTRACTION W/PHACO Right 09/04/2020   Procedure: CATARACT EXTRACTION PHACO AND INTRAOCULAR LENS PLACEMENT (IOC) RIGHT DIABETIC;  Surgeon: Mittie Gaskin, MD;  Location: [facility];  Service: Ophthalmology;  Laterality: Right;  4.72 1:03.2 7.4%   CHOLECYSTECTOMY  10/20/1975   COLONOSCOPY WITH PROPOFOL  N/A 01/21/2016   Procedure: COLONOSCOPY WITH PROPOFOL ;  Surgeon: Rogelia Copping, MD;  Location: [facility]  ENDOSCOPY;  Service: Endoscopy;  Laterality: N/A;   DILATION AND CURETTAGE OF UTERUS     Due to Amenorrhea   ESOPHAGOGASTRODUODENOSCOPY (EGD) WITH PROPOFOL  N/A 08/16/2018   Procedure: ESOPHAGOGASTRODUODENOSCOPY (EGD) WITH PROPOFOL ;  Surgeon: Copping Rogelia, MD;  Location: [facility] ENDOSCOPY;  Service: Endoscopy;  Laterality: N/A;   EYE SURGERY     HEMORRHOID BANDING  10/19/1978   HEMORROIDECTOMY     HERNIA REPAIR  10/19/2009   Temporal Area Excision Biopsy     For Birth Mark Changes, Negative Pathology   TONSILLECTOMY AND ADENOIDECTOMY     Family History  Problem Relation Age of Onset   Cancer Mother        brain tumor   Kidney disease Mother    Hypertension Mother    Heart disease Father    COPD Father    Hearing loss Father    Hypertension Sister    Arthritis Sister    Cancer Sister    Heart murmur Sister    GER disease Sister    Lupus Sister  Diabetes Sister    Arthritis Sister    Osteopenia Sister    Heart disease Sister    Hypertension Sister    Hearing loss Sister    Obesity Sister    Breast cancer Maternal Aunt 25   Leukemia Maternal Aunt    Social History   Socioeconomic History   Marital status: Divorced    Spouse name: Not on file   Number of children: 0   Years of education: Not on file   Highest education level: GED or equivalent  Occupational History   Occupation: Retired  Tobacco Use   Smoking status: Former    Current packs/day: 0.00    Average packs/day: 2.0 packs/day for 15.0 years (30.0 ttl pk-yrs)    Types: Cigarettes    Start date: 62    Quit date: 1983    Years since quitting: 42.6   Smokeless tobacco: Never   Tobacco comments:    smoking cessation materials not required  Vaping Use   Vaping status: Never Used  Substance and Sexual Activity   Alcohol use: Not Currently    Alcohol/week: 0.0 standard drinks of alcohol   Drug use: No   Sexual activity: Not Currently  Other Topics Concern   Not on file  Social History Narrative    Patient just moved in with her youngest sister and a roommate    Social Drivers of Corporate investment banker Strain: Low Risk  (05/25/2024)   Overall Financial Resource Strain (CARDIA)    Difficulty of Paying Living Expenses: Not very hard  Food Insecurity: No Food Insecurity (05/25/2024)   Hunger Vital Sign    Worried About Running Out of Food in the Last Year: Never true    Ran Out of Food in the Last Year: Never true  Recent Concern: Food Insecurity - Food Insecurity Present (05/02/2024)   Hunger Vital Sign    Worried About Running Out of Food in the Last Year: Never true    Ran Out of Food in the Last Year: Sometimes true  Transportation Needs: No Transportation Needs (05/25/2024)   PRAPARE - Administrator, Civil Service (Medical): No    Lack of Transportation (Non-Medical): No  Physical Activity: Insufficiently Active (05/25/2024)   Exercise Vital Sign    Days of Exercise per Week: 3 days    Minutes of Exercise per Session: 10 min  Stress: No Stress Concern Present (05/25/2024)   [employer] of Occupational Health - Occupational Stress Questionnaire    Feeling of Stress: Not at all  Social Connections: Socially Isolated (05/25/2024)   Social Connection and Isolation Panel    Frequency of Communication with Friends and Family: Once a week    Frequency of Social Gatherings with Friends and Family: Once a week    Attends Religious Services: Never    Database administrator or Organizations: No    Attends Engineer, structural: Never    Marital Status: Divorced    Tobacco Counseling Counseling given: Not Answered Tobacco comments: smoking cessation materials not required    Clinical Intake:  Pre-visit preparation completed: Yes  Pain : 0-10 Pain Score: 4  Pain Type: Chronic pain Pain Location: Knee Pain Orientation: Right, Left Pain Descriptors / Indicators: Aching, Discomfort, Constant Pain Onset: More than a month ago Pain Frequency: Constant      BMI - recorded: 46.7 Nutritional Status: BMI > 30  Obese Nutritional Risks: None Diabetes: Yes CBG done?: No Did pt. bring in CBG monitor  from home?: No  Lab Results  Component Value Date   HGBA1C 7.3 (A) 05/05/2024   HGBA1C 7.1 (A) 01/05/2024   HGBA1C 7.4 (A) 09/07/2023     How often do you need to have someone help you when you read instructions, pamphlets, or other written materials from your doctor or pharmacy?: 1 - Never  Interpreter Needed?: No  Information entered by :: JHONNIE DAS, LPN   Activities of Daily Living     05/25/2024    3:35 PM 05/21/2024   10:52 AM  In your present state of health, do you have any difficulty performing the following activities:  Hearing? 0 0  Vision? 0 0  Difficulty concentrating or making decisions? 0 0  Walking or climbing stairs? 1 1  Dressing or bathing? 0 0  Doing errands, shopping? 0 0  Preparing Food and eating ? N N  Using the Toilet? N N  In the past six months, have you accidently leaked urine? N Y  Do you have problems with loss of bowel control? Y N  Managing your Medications? N N  Managing your Finances? N N  Housekeeping or managing your Housekeeping? N N    Patient Care Team: Sowles, Krichna, MD as PCP - General (Family Medicine) Lucio Franky PARAS, OD as Consulting Physician (Optometry) Avanell Katz, MD as Consulting Physician (Physical Medicine and Rehabilitation) Alana, Sharyle LABOR, RPH-CPP as Pharmacist Pa, [facility] (Optometry)  I have updated your Care Teams any recent Medical Services you may have received from other providers in the past year.     Assessment:   This is a routine wellness examination for Yanai.  Hearing/Vision screen Hearing Screening - Comments:: NO AIDS Vision Screening - Comments:: WEARS GLASSES ALL DAY- [facility] DR.BRASINGTON- HAS APPT IN NOVEMBER   Goals Addressed             This Visit's Progress    DIET - EAT MORE FRUITS AND VEGETABLES          Depression Screen     05/25/2024    3:30 PM 05/05/2024    1:18 PM 01/05/2024    1:14 PM 09/07/2023    1:12 PM 07/28/2023   10:22 AM 05/20/2023    3:54 PM 04/27/2023    1:44 PM  PHQ 2/9 Scores  PHQ - 2 Score 0 0 0 0 0 0 0  PHQ- 9 Score 0  0 0 0  0    Fall Risk     05/21/2024   10:52 AM 05/05/2024    1:18 PM 01/05/2024    1:14 PM 09/07/2023    1:11 PM 07/28/2023   10:22 AM  Fall Risk   Falls in the past year? 0 0 0 0 0  Number falls in past yr: 0 0 0 0 0  Injury with Fall? 0 0 0 0 0  Risk for fall due to : No Fall Risks No Fall Risks No Fall Risks Impaired balance/gait No Fall Risks  Follow up Falls evaluation completed;Falls prevention discussed Falls evaluation completed Falls prevention discussed;Education provided;Falls evaluation completed Falls prevention discussed Falls prevention discussed    MEDICARE RISK AT HOME:  Medicare Risk at Home Any stairs in or around the home?: Yes If so, are there any without handrails?: No Home free of loose throw rugs in walkways, pet beds, electrical cords, etc?: Yes Adequate lighting in your home to reduce risk of falls?: Yes Life alert?: Yes Use of a cane, walker or w/c?: Yes (WALKER)  Grab bars in the bathroom?: Yes Shower chair or bench in shower?: Yes Elevated toilet seat or a handicapped toilet?: No  TIMED UP AND GO:  Was the test performed?  No  Cognitive Function: 6CIT completed        05/25/2024    3:38 PM 05/20/2023    3:56 PM 06/25/2022    9:52 AM 06/20/2020    3:00 PM 06/20/2019    3:59 PM  6CIT Screen  What Year? 0 points 0 points 0 points 0 points 0 points  What month? 0 points 0 points 0 points 0 points 0 points  What time? 0 points 0 points 0 points 0 points 0 points  Count back from 20 0 points 0 points 0 points 0 points 0 points  Months in reverse 0 points 0 points 0 points 0 points 0 points  Repeat phrase 0 points 0 points 0 points 0 points 0 points  Total Score 0 points 0 points 0 points 0 points 0 points     Immunizations Immunization History  Administered Date(s) Administered   Fluad Quad(high Dose 65+) 06/20/2020, 06/24/2021, 07/07/2022   Fluad Trivalent(High Dose 65+) 07/28/2023   Influenza, High Dose Seasonal PF 06/19/2015, 06/29/2016, 06/14/2017, 06/17/2018, 06/22/2019   Influenza-Unspecified 06/17/2011, 07/11/2019   Moderna Covid-19 Fall Seasonal Vaccine 27yrs & older 04/25/2022, 08/13/2023   Moderna SARS-COV2 Booster Vaccination 07/21/2022   PFIZER(Purple Top)SARS-COV-2 Vaccination 12/11/2019, 01/01/2020, 07/16/2020, 02/03/2021   PNEUMOCOCCAL CONJUGATE-20 07/28/2023   Pfizer Covid-19 Vaccine Bivalent Booster 91yrs & up 07/03/2021   Pneumococcal Conjugate-13 11/24/2011, 11/23/2013, 04/09/2017   Pneumococcal Polysaccharide-23 08/30/2009   RSV,unspecified 06/26/2022   Td 08/30/2009   Tdap 08/30/2009, 08/12/2020   Zoster Recombinant(Shingrix) 07/11/2019, 09/21/2019   Zoster, Live 02/12/2011    Screening Tests Health Maintenance  Topic Date Due   COVID-19 Vaccine (8 - Pfizer risk 2024-25 season) 02/11/2024   INFLUENZA VACCINE  05/19/2024   MAMMOGRAM  08/12/2024   Diabetic kidney evaluation - Urine ACR  09/06/2024   FOOT EXAM  09/06/2024   OPHTHALMOLOGY EXAM  09/22/2024   HEMOGLOBIN A1C  11/05/2024   Diabetic kidney evaluation - eGFR measurement  05/05/2025   Medicare Annual Wellness (AWV)  05/25/2025   DEXA SCAN  05/20/2027   DTaP/Tdap/Td (4 - Td or Tdap) 08/12/2030   Pneumococcal Vaccine: 50+ Years  Completed   Hepatitis C Screening  Completed   Zoster Vaccines- Shingrix  Completed   Hepatitis B Vaccines  Aged Out   HPV VACCINES  Aged Out   Meningococcal B Vaccine  Aged Out   Colonoscopy  Discontinued    Health Maintenance  Health Maintenance Due  Topic Date Due   COVID-19 Vaccine (8 - Pfizer risk 2024-25 season) 02/11/2024   INFLUENZA VACCINE  05/19/2024   Health Maintenance Items Addressed: Mammogram ordered; AGED OUT OF COLONOSCOPY; UP TO DATE ON BDS; UP TO  DATE ON SHOTS  Additional Screening:  Vision Screening: Recommended annual ophthalmology exams for early detection of glaucoma and other disorders of the eye. Would you like a referral to an eye doctor? No    Dental Screening: Recommended annual dental exams for proper oral hygiene  Community Resource Referral / Chronic Care Management: CRR required this visit?  No   CCM required this visit?  No   Plan:    I have personally reviewed and noted the following in the patient's chart:   Medical and social history Use of alcohol, tobacco or illicit drugs  Current medications and supplements including opioid prescriptions. Patient is  not currently taking opioid prescriptions. Functional ability and status Nutritional status Physical activity Advanced directives List of other physicians Hospitalizations, surgeries, and ER visits in previous 12 months Vitals Screenings to include cognitive, depression, and falls Referrals and appointments  In addition, I have reviewed and discussed with patient certain preventive protocols, quality metrics, and best practice recommendations. A written personalized care plan for preventive services as well as general preventive health recommendations were provided to patient.   Jhonnie GORMAN Das, LPN   10/20/7972   After Visit Summary: (MyChart) Due to this being a telephonic visit, the after visit summary with patients personalized plan was offered to patient via MyChart   Notes: MAMMOGRAM ORDERED

## 2024-05-25 NOTE — Patient Instructions (Signed)
 Amber Avery , Thank you for taking time out of your busy schedule to complete your Annual Wellness Visit with me. I enjoyed our conversation and look forward to speaking with you again next year. I, as well as your care team,  appreciate your ongoing commitment to your health goals. Please review the following plan we discussed and let me know if I can assist you in the future.  REFERRAL FOR MAMMOGRAM SENT You have an order for:  []   2D Mammogram  [x]   3D Mammogram  []   Bone Density     Please call for appointment:  [facility]  [address] [place] [address] [phone] [facility] [address] [place], [place] [address] [phone] [facility] [address] [place], [place] [address] [phone]   Make sure to wear two-piece clothing.  No lotions, powders, or deodorants the day of the appointment. Make sure to bring picture ID and insurance card.  Bring list of medications you are currently taking including any supplements.   Schedule your [place] screening mammogram through MyChart!   Log into your MyChart account.  Go to 'Visit' (or 'Appointments' if on mobile App) --> Schedule an Appointment  Under 'Select a Reason for Visit' choose the Mammogram Screening option.  Complete the pre-visit questions and select the time and place that best fits your schedule.   Follow up Visits: 05/31/25 @ 2:00 PM BY PHONE We will see or speak with you next year for your Next Medicare AWV with our clinical staff Have you seen your provider in the last 6 months (3 months if uncontrolled diabetes)? Yes  Clinician Recommendations:  Aim for 30 minutes of exercise or brisk walking, 6-8 glasses of water, and 5 servings of fruits and vegetables each day. TAKE CARE!      This is a list of the screenings recommended for you:  Health Maintenance  Topic Date  Due   COVID-19 Vaccine (8 - Pfizer risk 2024-25 season) 02/11/2024   Flu Shot  05/19/2024   Mammogram  08/12/2024   Yearly kidney health urinalysis for diabetes  09/06/2024   Complete foot exam   09/06/2024   Eye exam for diabetics  09/22/2024   Hemoglobin A1C  11/05/2024   Yearly kidney function blood test for diabetes  05/05/2025   Medicare Annual Wellness Visit  05/25/2025   DEXA scan (bone density measurement)  05/20/2027   DTaP/Tdap/Td vaccine (4 - Td or Tdap) 08/12/2030   Pneumococcal Vaccine for age over 36  Completed   Hepatitis C Screening  Completed   Zoster (Shingles) Vaccine  Completed   Hepatitis B Vaccine  Aged Out   HPV Vaccine  Aged Out   Meningitis B Vaccine  Aged Out   Colon Cancer Screening  Discontinued    Advanced directives: (In Chart) A copy of your advanced directives are scanned into your chart should your provider ever need it. Advance Care Planning is important because it:  [x]  Makes sure you receive the medical care that is consistent with your values, goals, and preferences  [x]  It provides guidance to your family and loved ones and reduces their decisional burden about whether or not they are making the right decisions based on your wishes.  Follow the link provided in your after visit summary or read over the paperwork we have mailed to you to help you started getting your  Advance Directives in place. If you need assistance in completing these, please reach out to us  so that we can help you!

## 2024-06-06 ENCOUNTER — Inpatient Hospital Stay
Admission: EM | Admit: 2024-06-06 | Discharge: 2024-06-16 | DRG: 872 | Disposition: A | Discharge status: Skilled Nursing Facility | Attending: Internal Medicine | Admitting: Internal Medicine

## 2024-06-06 ENCOUNTER — Other Ambulatory Visit: Payer: Self-pay

## 2024-06-06 ENCOUNTER — Emergency Department

## 2024-06-06 DIAGNOSIS — Z8261 Family history of arthritis: Secondary | ICD-10-CM

## 2024-06-06 DIAGNOSIS — I2510 Atherosclerotic heart disease of native coronary artery without angina pectoris: Secondary | ICD-10-CM | POA: Diagnosis not present

## 2024-06-06 DIAGNOSIS — Z7984 Long term (current) use of oral hypoglycemic drugs: Secondary | ICD-10-CM | POA: Diagnosis not present

## 2024-06-06 DIAGNOSIS — K5730 Diverticulosis of large intestine without perforation or abscess without bleeding: Secondary | ICD-10-CM | POA: Diagnosis not present

## 2024-06-06 DIAGNOSIS — Z7901 Long term (current) use of anticoagulants: Secondary | ICD-10-CM

## 2024-06-06 DIAGNOSIS — J4489 Other specified chronic obstructive pulmonary disease: Secondary | ICD-10-CM | POA: Diagnosis present

## 2024-06-06 DIAGNOSIS — M6259 Muscle wasting and atrophy, not elsewhere classified, multiple sites: Secondary | ICD-10-CM | POA: Diagnosis not present

## 2024-06-06 DIAGNOSIS — E66813 Obesity, class 3: Secondary | ICD-10-CM | POA: Diagnosis present

## 2024-06-06 DIAGNOSIS — Z794 Long term (current) use of insulin: Secondary | ICD-10-CM | POA: Diagnosis not present

## 2024-06-06 DIAGNOSIS — Z6841 Body Mass Index (BMI) 40.0 and over, adult: Secondary | ICD-10-CM | POA: Diagnosis not present

## 2024-06-06 DIAGNOSIS — G4733 Obstructive sleep apnea (adult) (pediatric): Secondary | ICD-10-CM | POA: Diagnosis present

## 2024-06-06 DIAGNOSIS — Z833 Family history of diabetes mellitus: Secondary | ICD-10-CM

## 2024-06-06 DIAGNOSIS — Z8249 Family history of ischemic heart disease and other diseases of the circulatory system: Secondary | ICD-10-CM

## 2024-06-06 DIAGNOSIS — Z841 Family history of disorders of kidney and ureter: Secondary | ICD-10-CM | POA: Diagnosis not present

## 2024-06-06 DIAGNOSIS — A4150 Gram-negative sepsis, unspecified: Secondary | ICD-10-CM | POA: Diagnosis present

## 2024-06-06 DIAGNOSIS — Z79899 Other long term (current) drug therapy: Secondary | ICD-10-CM | POA: Diagnosis not present

## 2024-06-06 DIAGNOSIS — Z885 Allergy status to narcotic agent status: Secondary | ICD-10-CM | POA: Diagnosis not present

## 2024-06-06 DIAGNOSIS — I872 Venous insufficiency (chronic) (peripheral): Secondary | ICD-10-CM | POA: Diagnosis present

## 2024-06-06 DIAGNOSIS — N1832 Chronic kidney disease, stage 3b: Secondary | ICD-10-CM | POA: Diagnosis present

## 2024-06-06 DIAGNOSIS — E119 Type 2 diabetes mellitus without complications: Secondary | ICD-10-CM

## 2024-06-06 DIAGNOSIS — E785 Hyperlipidemia, unspecified: Secondary | ICD-10-CM | POA: Diagnosis present

## 2024-06-06 DIAGNOSIS — B961 Klebsiella pneumoniae [K. pneumoniae] as the cause of diseases classified elsewhere: Secondary | ICD-10-CM | POA: Diagnosis present

## 2024-06-06 DIAGNOSIS — A419 Sepsis, unspecified organism: Secondary | ICD-10-CM

## 2024-06-06 DIAGNOSIS — N390 Urinary tract infection, site not specified: Principal | ICD-10-CM | POA: Diagnosis present

## 2024-06-06 DIAGNOSIS — I129 Hypertensive chronic kidney disease with stage 1 through stage 4 chronic kidney disease, or unspecified chronic kidney disease: Secondary | ICD-10-CM | POA: Diagnosis present

## 2024-06-06 DIAGNOSIS — R2681 Unsteadiness on feet: Secondary | ICD-10-CM | POA: Diagnosis not present

## 2024-06-06 DIAGNOSIS — I4891 Unspecified atrial fibrillation: Secondary | ICD-10-CM | POA: Diagnosis present

## 2024-06-06 DIAGNOSIS — E876 Hypokalemia: Secondary | ICD-10-CM | POA: Diagnosis not present

## 2024-06-06 DIAGNOSIS — E042 Nontoxic multinodular goiter: Secondary | ICD-10-CM | POA: Diagnosis not present

## 2024-06-06 DIAGNOSIS — E1159 Type 2 diabetes mellitus with other circulatory complications: Secondary | ICD-10-CM | POA: Diagnosis not present

## 2024-06-06 DIAGNOSIS — Z87891 Personal history of nicotine dependence: Secondary | ICD-10-CM | POA: Diagnosis not present

## 2024-06-06 DIAGNOSIS — Z604 Social exclusion and rejection: Secondary | ICD-10-CM | POA: Diagnosis present

## 2024-06-06 DIAGNOSIS — I2489 Other forms of acute ischemic heart disease: Secondary | ICD-10-CM | POA: Diagnosis present

## 2024-06-06 DIAGNOSIS — Z825 Family history of asthma and other chronic lower respiratory diseases: Secondary | ICD-10-CM

## 2024-06-06 DIAGNOSIS — G629 Polyneuropathy, unspecified: Secondary | ICD-10-CM | POA: Diagnosis not present

## 2024-06-06 DIAGNOSIS — Z8269 Family history of other diseases of the musculoskeletal system and connective tissue: Secondary | ICD-10-CM

## 2024-06-06 DIAGNOSIS — R519 Headache, unspecified: Secondary | ICD-10-CM | POA: Diagnosis not present

## 2024-06-06 DIAGNOSIS — I10 Essential (primary) hypertension: Secondary | ICD-10-CM | POA: Diagnosis not present

## 2024-06-06 DIAGNOSIS — G4489 Other headache syndrome: Secondary | ICD-10-CM | POA: Diagnosis not present

## 2024-06-06 DIAGNOSIS — M109 Gout, unspecified: Secondary | ICD-10-CM | POA: Diagnosis present

## 2024-06-06 DIAGNOSIS — Z961 Presence of intraocular lens: Secondary | ICD-10-CM | POA: Diagnosis present

## 2024-06-06 DIAGNOSIS — R531 Weakness: Secondary | ICD-10-CM | POA: Diagnosis not present

## 2024-06-06 DIAGNOSIS — E1122 Type 2 diabetes mellitus with diabetic chronic kidney disease: Secondary | ICD-10-CM | POA: Diagnosis present

## 2024-06-06 DIAGNOSIS — D259 Leiomyoma of uterus, unspecified: Secondary | ICD-10-CM | POA: Diagnosis not present

## 2024-06-06 DIAGNOSIS — R509 Fever, unspecified: Secondary | ICD-10-CM | POA: Diagnosis not present

## 2024-06-06 DIAGNOSIS — A4151 Sepsis due to Escherichia coli [E. coli]: Secondary | ICD-10-CM | POA: Diagnosis not present

## 2024-06-06 DIAGNOSIS — L309 Dermatitis, unspecified: Secondary | ICD-10-CM | POA: Diagnosis not present

## 2024-06-06 DIAGNOSIS — N83202 Unspecified ovarian cyst, left side: Secondary | ICD-10-CM | POA: Diagnosis not present

## 2024-06-06 DIAGNOSIS — Z9841 Cataract extraction status, right eye: Secondary | ICD-10-CM

## 2024-06-06 DIAGNOSIS — I700 Atherosclerosis of aorta: Secondary | ICD-10-CM | POA: Diagnosis not present

## 2024-06-06 DIAGNOSIS — Z7985 Long-term (current) use of injectable non-insulin antidiabetic drugs: Secondary | ICD-10-CM

## 2024-06-06 DIAGNOSIS — N182 Chronic kidney disease, stage 2 (mild): Secondary | ICD-10-CM

## 2024-06-06 DIAGNOSIS — Z9842 Cataract extraction status, left eye: Secondary | ICD-10-CM

## 2024-06-06 DIAGNOSIS — G4700 Insomnia, unspecified: Secondary | ICD-10-CM | POA: Diagnosis not present

## 2024-06-06 DIAGNOSIS — Z822 Family history of deafness and hearing loss: Secondary | ICD-10-CM

## 2024-06-06 DIAGNOSIS — I6523 Occlusion and stenosis of bilateral carotid arteries: Secondary | ICD-10-CM | POA: Diagnosis not present

## 2024-06-06 DIAGNOSIS — E569 Vitamin deficiency, unspecified: Secondary | ICD-10-CM | POA: Diagnosis not present

## 2024-06-06 DIAGNOSIS — J45909 Unspecified asthma, uncomplicated: Secondary | ICD-10-CM | POA: Diagnosis not present

## 2024-06-06 LAB — MAGNESIUM: Magnesium: 2 mg/dL (ref 1.7–2.4)

## 2024-06-06 LAB — COMPREHENSIVE METABOLIC PANEL WITH GFR
ALT: 54 U/L — ABNORMAL HIGH (ref 0–44)
AST: 83 U/L — ABNORMAL HIGH (ref 15–41)
Albumin: 3.2 g/dL — ABNORMAL LOW (ref 3.5–5.0)
Alkaline Phosphatase: 86 U/L (ref 38–126)
Anion gap: 15 (ref 5–15)
BUN: 46 mg/dL — ABNORMAL HIGH (ref 8–23)
CO2: 20 mmol/L — ABNORMAL LOW (ref 22–32)
Calcium: 8.7 mg/dL — ABNORMAL LOW (ref 8.9–10.3)
Chloride: 98 mmol/L (ref 98–111)
Creatinine, Ser: 1.54 mg/dL — ABNORMAL HIGH (ref 0.44–1.00)
GFR, Estimated: 35 mL/min — ABNORMAL LOW
Glucose, Bld: 166 mg/dL — ABNORMAL HIGH (ref 70–99)
Potassium: 4.2 mmol/L (ref 3.5–5.1)
Sodium: 133 mmol/L — ABNORMAL LOW (ref 135–145)
Total Bilirubin: 1.3 mg/dL — ABNORMAL HIGH (ref 0.0–1.2)
Total Protein: 6.9 g/dL (ref 6.5–8.1)

## 2024-06-06 LAB — CBC WITH DIFFERENTIAL/PLATELET
Abs Immature Granulocytes: 0.23 10*3/uL — ABNORMAL HIGH (ref 0.00–0.07)
Basophils Absolute: 0 10*3/uL (ref 0.0–0.1)
Basophils Relative: 0 %
Eosinophils Absolute: 0 10*3/uL (ref 0.0–0.5)
Eosinophils Relative: 0 %
HCT: 42.5 % (ref 36.0–46.0)
Hemoglobin: 14.1 g/dL (ref 12.0–15.0)
Immature Granulocytes: 2 %
Lymphocytes Relative: 7 %
Lymphs Abs: 0.7 10*3/uL (ref 0.7–4.0)
MCH: 31.1 pg (ref 26.0–34.0)
MCHC: 33.2 g/dL (ref 30.0–36.0)
MCV: 93.8 fL (ref 80.0–100.0)
Monocytes Absolute: 1.3 10*3/uL — ABNORMAL HIGH (ref 0.1–1.0)
Monocytes Relative: 12 %
Neutro Abs: 8.4 10*3/uL — ABNORMAL HIGH (ref 1.7–7.7)
Neutrophils Relative %: 79 %
Platelets: 142 10*3/uL — ABNORMAL LOW (ref 150–400)
RBC: 4.53 MIL/uL (ref 3.87–5.11)
RDW: 15.7 % — ABNORMAL HIGH (ref 11.5–15.5)
WBC: 10.7 10*3/uL — ABNORMAL HIGH (ref 4.0–10.5)
nRBC: 0.2 % (ref 0.0–0.2)

## 2024-06-06 LAB — URINALYSIS, W/ REFLEX TO CULTURE (INFECTION SUSPECTED)
Bilirubin Urine: NEGATIVE
Glucose, UA: >=500 mg/dL — AB
Ketones, ur: 5 mg/dL — AB
Nitrite: NEGATIVE
Protein, ur: 100 mg/dL — AB
Specific Gravity, Urine: 1.013 (ref 1.005–1.030)
pH: 5 (ref 5.0–8.0)

## 2024-06-06 LAB — TROPONIN I (HIGH SENSITIVITY): Troponin I (High Sensitivity): 27 ng/L — ABNORMAL HIGH

## 2024-06-06 LAB — PROTIME-INR
INR: 1.1 (ref 0.8–1.2)
Prothrombin Time: 14.9 s (ref 11.4–15.2)

## 2024-06-06 LAB — LACTIC ACID, PLASMA: Lactic Acid, Venous: 1.5 mmol/L (ref 0.5–1.9)

## 2024-06-06 MED ORDER — VANCOMYCIN HCL IN DEXTROSE 1-5 GM/200ML-% IV SOLN
1000.0000 mg | Freq: Once | INTRAVENOUS | Status: AC
  Administered 2024-06-07: 1000 mg via INTRAVENOUS
  Filled 2024-06-06: qty 200

## 2024-06-06 MED ORDER — LACTATED RINGERS IV SOLN: INTRAVENOUS | Status: AC

## 2024-06-06 MED ORDER — LACTATED RINGERS IV BOLUS (SEPSIS)
1000.0000 mL | Freq: Once | INTRAVENOUS | Status: AC
  Administered 2024-06-06: 1000 mL via INTRAVENOUS

## 2024-06-06 MED ORDER — METRONIDAZOLE 500 MG/100ML IV SOLN
500.0000 mg | Freq: Once | INTRAVENOUS | Status: AC
  Administered 2024-06-07: 500 mg via INTRAVENOUS
  Filled 2024-06-06: qty 100

## 2024-06-06 MED ORDER — SODIUM CHLORIDE 0.9 % IV SOLN
2.0000 g | Freq: Once | INTRAVENOUS | Status: AC
  Administered 2024-06-06: 2 g via INTRAVENOUS
  Filled 2024-06-06: qty 12.5

## 2024-06-06 MED ORDER — ACETAMINOPHEN 500 MG PO TABS
1000.0000 mg | ORAL_TABLET | Freq: Once | ORAL | Status: AC
  Administered 2024-06-06: 1000 mg via ORAL
  Filled 2024-06-06: qty 2

## 2024-06-06 NOTE — Sepsis Progress Note (Signed)
 Elink monitoring for the code sepsis protocol.

## 2024-06-06 NOTE — ED Triage Notes (Signed)
 Pt arrives via [facility] from home with medic reporting that pt has had generalized weakness and headache (since last Tuesday). On EMS arrival, pt in irregular rhythm-afib 140-160, temp 100.6, 110/63, decreased oral intake. A/o x 4. IV established in the right hand.

## 2024-06-06 NOTE — ED Provider Notes (Signed)
 [facility] Provider Note    Event Date/Time   First MD Initiated Contact with Patient 06/06/24 2303     (approximate)   History   Weakness   HPI  Amber Avery is a 78 y.o. female with history of hypertension, hyperlipidemia, diabetes, chronic kidney disease, sleep apnea, psoriasis who presents to the emergency department with complaints of fevers, chills, and generalized weakness for the past several days.  Has also had headache but no new neck pain or neck stiffness.  No falls, head injury.  No numbness, tingling or weakness.  Patient is in new onset atrial fibrillation.  No history of the same.  She denies palpitations, chest pain or shortness of breath.  No vomiting, diarrhea.  No urinary symptoms.   History provided by patient, EMS, sister.    Past Medical History:  Diagnosis Date   Allergy    Arthritis    Asthma    Diabetes mellitus without complication (HCC)    Gout    Hyperlipidemia    Hypertension    Iron  deficiency anemia 04/20/2018   Oxygen  deficiency    PONV (postoperative nausea and vomiting)    after hemorroid surgery   Shortness of breath dyspnea    Sleep apnea     Past Surgical History:  Procedure Laterality Date   APPENDECTOMY     CATARACT EXTRACTION W/PHACO Left 08/14/2020   Procedure: CATARACT EXTRACTION PHACO AND INTRAOCULAR LENS PLACEMENT (IOC) LEFT DIABETIC 3.56 00:59.6 6.0%;  Surgeon: Mittie Gaskin, MD;  Location: [facility];  Service: Ophthalmology;  Laterality: Left;  Diabetic   CATARACT EXTRACTION W/PHACO Right 09/04/2020   Procedure: CATARACT EXTRACTION PHACO AND INTRAOCULAR LENS PLACEMENT (IOC) RIGHT DIABETIC;  Surgeon: Mittie Gaskin, MD;  Location: [facility];  Service: Ophthalmology;  Laterality: Right;  4.72 1:03.2 7.4%   CHOLECYSTECTOMY  10/20/1975   COLONOSCOPY WITH PROPOFOL  N/A 01/21/2016   Procedure: COLONOSCOPY WITH PROPOFOL ;  Surgeon: Rogelia Copping, MD;  Location: [facility]  ENDOSCOPY;  Service: Endoscopy;  Laterality: N/A;   DILATION AND CURETTAGE OF UTERUS     Due to Amenorrhea   ESOPHAGOGASTRODUODENOSCOPY (EGD) WITH PROPOFOL  N/A 08/16/2018   Procedure: ESOPHAGOGASTRODUODENOSCOPY (EGD) WITH PROPOFOL ;  Surgeon: Copping Rogelia, MD;  Location: [facility] ENDOSCOPY;  Service: Endoscopy;  Laterality: N/A;   EYE SURGERY     HEMORRHOID BANDING  10/19/1978   HEMORROIDECTOMY     HERNIA REPAIR  10/19/2009   Temporal Area Excision Biopsy     For Birth Mark Changes, Negative Pathology   TONSILLECTOMY AND ADENOIDECTOMY      MEDICATIONS:  Prior to Admission medications   Medication Sig Start Date End Date Taking? Authorizing Provider  acetaminophen  (TYLENOL ) 500 MG tablet Take 1 tablet (500 mg total) by mouth every 6 (six) hours as needed. 08/09/17   Sowles, Krichna, MD  albuterol  (VENTOLIN  HFA) 108 (90 Base) MCG/ACT inhaler Inhale 2 puffs into the lungs every 6 (six) hours as needed for wheezing or shortness of breath. 02/23/23   Sowles, Krichna, MD  allopurinol  (ZYLOPRIM ) 100 MG tablet Take 1 tablet (100 mg total) by mouth 2 (two) times daily. 05/05/24   Sowles, Krichna, MD  atorvastatin  (LIPITOR) 20 MG tablet TAKE 1 TABLET BY MOUTH DAILY FOR CHOLESTEROL 05/05/24   Sowles, Krichna, MD  Blood Pressure KIT 1 each by Does not apply route daily. 07/07/22   Sowles, Krichna, MD  dapagliflozin  propanediol (FARXIGA ) 10 MG TABS tablet Take 1 tablet (10 mg total) by mouth daily before breakfast. 05/17/23  Sowles, Krichna, MD  Dulaglutide  (TRULICITY ) 4.5 MG/0.5ML SOAJ Inject 4.5 mg as directed once a week. 05/08/24   Sowles, Krichna, MD  furosemide  (LASIX ) 20 MG tablet Take 1 tablet (20 mg total) by mouth 2 (two) times daily. Second dose around noon 01/05/24   Sowles, Krichna, MD  gabapentin  (NEURONTIN ) 300 MG capsule TAKE 1 CAPSULE BY MOUTH IN THE MORNING, 1 CAPSULE IN THE EVENING AND 2 CAPSULES AT NIGHT 04/14/23   Sowles, Krichna, MD  glucose blood [facility] VERIO) test strip USE AS DIRECTED  07/07/22   Glenard Mire, MD  Lancets [facility] ULTRASOFT) lancets Use as instructed 07/07/22   Sowles, Krichna, MD  metFORMIN  (GLUCOPHAGE -XR) 750 MG 24 hr tablet Take 1 tablet (750 mg total) by mouth daily with breakfast. 05/05/24   Sowles, Krichna, MD  methocarbamol  (ROBAXIN ) 500 MG tablet Take 500 mg by mouth 2 (two) times daily.    Avanell Katz, MD  potassium chloride  SA (KLOR-CON  M) 20 MEQ tablet TAKE 1 TABLET BY MOUTH DAILY 05/08/24   Sowles, Krichna, MD  traMADol  (ULTRAM ) 50 MG tablet Take 25-50 mg by mouth 2 (two) times daily as needed. 03/03/22   Chasnis, Katz, MD  triamcinolone  ointment (KENALOG ) 0.1 % APPLY TOPICALLY TWICE DAILY AS NEEDED. GENERIC EQUIVALENT FOR KENALOG  02/08/24   Sowles, Krichna, MD  vitamin B-12 (CYANOCOBALAMIN ) 500 MCG tablet Take 500 mcg by mouth daily.    [provider]  Vitamin D , Cholecalciferol , 25 MCG (1000 UT) TABS Take 1 capsule by mouth daily. 12/14/19   Sowles, Krichna, MD  VOLTAREN  1 % GEL Apply 4 g topically 4 (four) times daily. 11/24/23   Sowles, Krichna, MD    Physical Exam   Triage Vital Signs: ED Triage Vitals  Encounter Vitals Group     BP 06/06/24 2255 (!) 104/92     Girls Systolic BP Percentile --      Girls Diastolic BP Percentile --      Boys Systolic BP Percentile --      Boys Diastolic BP Percentile --      Pulse Rate 06/06/24 2255 (!) 130     Resp 06/06/24 2255 (!) 29     Temp 06/06/24 2255 98.9 F (37.2 C)     Temp Source 06/06/24 2255 Oral     SpO2 06/06/24 2255 93 %     Weight --      Height 06/06/24 2253 5' 1 (1.549 m)     Head Circumference --      Peak Flow --      Pain Score 06/06/24 2253 4     Pain Loc --      Pain Education --      Exclude from Growth Chart --     Most recent vital signs: Vitals:   06/07/24 0530 06/07/24 0600  BP: 121/82 (!) 131/90  Pulse:    Resp: (!) 21 (!) 23  Temp:    SpO2:      CONSTITUTIONAL: Alert, responds appropriately to questions.  Elderly, no distress,  pleasant HEAD: Normocephalic, atraumatic EYES: Conjunctivae clear, pupils appear equal, sclera nonicteric ENT: normal nose; moist mucous membranes NECK: Supple, normal ROM, no midline spinal tenderness or step-off or deformity.  No meningismus.  Trachea midline. CARD: Irregular and tachycardic; S1 and S2 appreciated RESP: Normal chest excursion without splinting or tachypnea; breath sounds clear and equal bilaterally; no wheezes, no rhonchi, no rales, no hypoxia or respiratory distress, speaking full sentences ABD/GI: Non-distended; soft, non-tender, no rebound, no guarding, no peritoneal signs  BACK: The back appears normal EXT: Normal ROM in all joints; no deformity noted, no edema, no calf tenderness or calf swelling SKIN: Normal color for age and race; warm; no rash on exposed skin NEURO: Moves all extremities equally, normal speech PSYCH: The patient's mood and manner are appropriate.   ED Results / Procedures / Treatments   LABS: (all labs ordered are listed, but only abnormal results are displayed) Labs Reviewed  URINALYSIS, W/ REFLEX TO CULTURE (INFECTION SUSPECTED) - Abnormal; Notable for the following components:      Result Value   Color, Urine YELLOW (*)    APPearance CLOUDY (*)    Glucose, UA >=500 (*)    Hgb urine dipstick MODERATE (*)    Ketones, ur 5 (*)    Protein, ur 100 (*)    Leukocytes,Ua SMALL (*)    Bacteria, UA MANY (*)    All other components within normal limits  CBC WITH DIFFERENTIAL/PLATELET - Abnormal; Notable for the following components:   WBC 10.7 (*)    RDW 15.7 (*)    Platelets 142 (*)    Neutro Abs 8.4 (*)    Monocytes Absolute 1.3 (*)    Abs Immature Granulocytes 0.23 (*)    All other components within normal limits  COMPREHENSIVE METABOLIC PANEL WITH GFR - Abnormal; Notable for the following components:   Sodium 133 (*)    CO2 20 (*)    Glucose, Bld 166 (*)    BUN 46 (*)    Creatinine, Ser 1.54 (*)    Calcium  8.7 (*)    Albumin 3.2 (*)     AST 83 (*)    ALT 54 (*)    Total Bilirubin 1.3 (*)    GFR, Estimated 35 (*)    All other components within normal limits  D-DIMER, QUANTITATIVE - Abnormal; Notable for the following components:   D-Dimer, Quant 3.83 (*)    All other components within normal limits  TROPONIN I (HIGH SENSITIVITY) - Abnormal; Notable for the following components:   Troponin I (High Sensitivity) 27 (*)    All other components within normal limits  TROPONIN I (HIGH SENSITIVITY) - Abnormal; Notable for the following components:   Troponin I (High Sensitivity) 26 (*)    All other components within normal limits  CULTURE, BLOOD (ROUTINE X 2)  CULTURE, BLOOD (ROUTINE X 2)  URINE CULTURE  LACTIC ACID, PLASMA  PROTIME-INR  TSH  PROCALCITONIN  MAGNESIUM   CBC WITH DIFFERENTIAL/PLATELET  CORTISOL-AM, BLOOD     EKG:  EKG Interpretation Date/Time:  Tuesday June 06 2024 22:55:54 EDT Ventricular Rate:  136 PR Interval:    QRS Duration:  112 QT Interval:  308 QTC Calculation: 464 R Axis:   133  Text Interpretation: Atrial fibrillation Ventricular tachycardia, unsustained IRBBB and LPFB Low voltage, extremity and precordial leads Confirmed by Neomi Neptune [phone]) on 06/07/2024 6:36:05 AM         RADIOLOGY: My personal review and interpretation of imaging: CT scans show no acute abnormality.  I have personally reviewed all radiology reports.   CT Angio Chest PE W and/or Wo Contrast Result Date: 06/07/2024 CLINICAL DATA:  Pulmonary embolism (PE) suspected, low to intermediate prob, positive D-dimer. Suspected sepsis. EXAM: CT ANGIOGRAPHY CHEST WITH CONTRAST TECHNIQUE: Multidetector CT imaging of the chest was performed using the standard protocol during bolus administration of intravenous contrast. Multiplanar CT image reconstructions and MIPs were obtained to evaluate the vascular anatomy. RADIATION DOSE REDUCTION: This exam was performed according to the  departmental dose-optimization program  which includes automated exposure control, adjustment of the mA and/or kV according to patient size and/or use of iterative reconstruction technique. CONTRAST:  75mL OMNIPAQUE  IOHEXOL  350 MG/ML SOLN COMPARISON:  12/23/2014 FINDINGS: Cardiovascular: No filling defects in the pulmonary arteries to suggest pulmonary emboli. Heart is normal size. Aorta is normal caliber. Scattered coronary artery and aortic calcifications. Mediastinum/Nodes: No mediastinal, hilar, or axillary adenopathy. Trachea and esophagus are unremarkable. Multiple calcified nodules in the thyroid  are unchanged since prior study. Lungs/Pleura: No confluent airspace opacities or effusions. Upper Abdomen: No acute findings Musculoskeletal: Chest wall soft tissues are unremarkable. No acute bony abnormality. Review of the MIP images confirms the above findings. IMPRESSION: No evidence of pulmonary embolus. Coronary artery disease. No acute cardiopulmonary disease. Aortic Atherosclerosis (ICD10-I70.0). Electronically Signed   By: Franky Crease M.D.   On: 06/07/2024 01:01   CT ABDOMEN PELVIS W CONTRAST Result Date: 06/07/2024 CLINICAL DATA:  Sepsis EXAM: CT ABDOMEN AND PELVIS WITH CONTRAST TECHNIQUE: Multidetector CT imaging of the abdomen and pelvis was performed using the standard protocol following bolus administration of intravenous contrast. RADIATION DOSE REDUCTION: This exam was performed according to the departmental dose-optimization program which includes automated exposure control, adjustment of the mA and/or kV according to patient size and/or use of iterative reconstruction technique. CONTRAST:  75mL OMNIPAQUE  IOHEXOL  350 MG/ML SOLN COMPARISON:  None Available. FINDINGS: Lower chest: No acute abnormality Hepatobiliary: No focal liver abnormality is seen. Status post cholecystectomy. No biliary dilatation. Pancreas: No focal abnormality or ductal dilatation. Spleen: No focal abnormality.  Normal size. Adrenals/Urinary Tract: Adrenal glands  normal. No renal mass, stone or hydronephrosis. Nonspecific bilateral perinephric stranding. Urinary bladder decompressed, grossly unremarkable. Stomach/Bowel: Left colonic diverticulosis. No active diverticulitis. Stomach and small bowel decompressed. No bowel obstruction or inflammatory process. Vascular/Lymphatic: No evidence of aneurysm or adenopathy. Reproductive: 6 cm partially calcified left uterine fibroid. 2.8 cm cyst in the left ovary. Right ovary unremarkable. Other: No free fluid or free air. Musculoskeletal: No acute bony abnormality. IMPRESSION: No acute findings in the abdomen or pelvis. Nonspecific bilateral perinephric stranding which may be related to remote insult. No hydronephrosis or stones. Aortic atherosclerosis. Left colonic diverticulosis. Electronically Signed   By: Franky Crease M.D.   On: 06/07/2024 00:59   CT HEAD WO CONTRAST ( ) Result Date: 06/06/2024 CLINICAL DATA:  Headache, new onset (Age >= 51y) generalized weakness and headache EXAM: CT HEAD WITHOUT CONTRAST TECHNIQUE: Contiguous axial images were obtained from the base of the skull through the vertex without intravenous contrast. RADIATION DOSE REDUCTION: This exam was performed according to the departmental dose-optimization program which includes automated exposure control, adjustment of the mA and/or kV according to patient size and/or use of iterative reconstruction technique. COMPARISON:  CT head 12/23/2014 FINDINGS: Brain: No evidence of large-territorial acute infarction. No parenchymal hemorrhage. No mass lesion. No extra-axial collection. No mass effect or midline shift. No hydrocephalus. Basilar cisterns are patent. Vascular: No hyperdense vessel. Atherosclerotic calcifications are present within the cavernous internal carotid arteries. Skull: No acute fracture or focal lesion. Sinuses/Orbits: Paranasal sinuses and mastoid air cells are clear. Bilateral lens replacement. Otherwise the orbits are unremarkable. Other:  None. IMPRESSION: No acute intracranial abnormality. Electronically Signed   By: Morgane  Naveau M.D.   On: 06/06/2024 23:35   DG Chest Port 1 View if patient is in a treatment room. Result Date: 06/06/2024 CLINICAL DATA:  Suspected sepsis, former smoker, asthma EXAM: PORTABLE CHEST 1 VIEW COMPARISON:  12/23/2014 FINDINGS: Stable cardiomediastinal silhouette. Aortic  atherosclerotic calcification. Chronic interstitial coarsening. No focal consolidation, pleural effusion, or pneumothorax. No displaced rib fractures. IMPRESSION: Chronic interstitial coarsening.  No acute abnormality. Electronically Signed   By: Norman Gatlin M.D.   On: 06/06/2024 23:20     PROCEDURES:  Critical Care performed: Yes, see critical care procedure note(s)   CRITICAL CARE Performed by: Josette Angello Chien   Total critical care time: 45 minutes  Critical care time was exclusive of separately billable procedures and treating other patients.  Critical care was necessary to treat or prevent imminent or life-threatening deterioration.  Critical care was time spent personally by me on the following activities: development of treatment plan with patient and/or surrogate as well as nursing, discussions with consultants, evaluation of patient's response to treatment, examination of patient, obtaining history from patient or surrogate, ordering and performing treatments and interventions, ordering and review of laboratory studies, ordering and review of radiographic studies, pulse oximetry and re-evaluation of patient's condition.   SABRA1-3 Lead EKG Interpretation  Performed by: Haislee Corso, Josette SAILOR, DO Authorized by: Markelle Asaro, Josette SAILOR, DO     Interpretation: abnormal     ECG rate:  141   ECG rate assessment: tachycardic     Rhythm: atrial fibrillation     Ectopy: none     Conduction: normal       IMPRESSION / MDM / ASSESSMENT AND PLAN / ED COURSE  I reviewed the triage vital signs and the nursing notes.    Patient here  with headache, fever, new onset A-fib with RVR.  The patient is on the cardiac monitor to evaluate for evidence of arrhythmia and/or significant heart rate changes.   DIFFERENTIAL DIAGNOSIS (includes but not limited to):   Sepsis, bacteremia, pneumonia, UTI, meningitis, encephalitis, intracranial hemorrhage, electrolyte derangement, anemia, thyroid  dysfunction, new onset A-fib, ACS, PE, doubt stroke   Patient's presentation is most consistent with acute presentation with potential threat to life or bodily function.   PLAN: Febrile with EMS, tachycardic and tachypneic here.  Will initiate septic workup with cultures, labs, urine, chest x-ray, IV fluids and broad-spectrum antibiotics.   MEDICATIONS GIVEN IN ED: Medications  lactated ringers  infusion (0 mLs Intravenous Stopped 06/07/24 0521)  diltiazem  (CARDIZEM ) 125 mg in dextrose  5% 125 mL (1 mg/mL) infusion (5 mg/hr Intravenous New Bag/Given 06/07/24 0215)  allopurinol  (ZYLOPRIM ) tablet 100 mg (has no administration in time range)  traMADol  (ULTRAM ) tablet 25-50 mg (has no administration in time range)  atorvastatin  (LIPITOR) tablet 20 mg (has no administration in time range)  dapagliflozin  propanediol (FARXIGA ) tablet 10 mg (has no administration in time range)  cyanocobalamin  (VITAMIN B12) tablet 500 mcg (has no administration in time range)  potassium chloride  SA (KLOR-CON  M) CR tablet 20 mEq (has no administration in time range)  cholecalciferol  (VITAMIN D3) tablet 1,000 Units (has no administration in time range)  lactated ringers  infusion (150 mL/hr Intravenous New Bag/Given 06/07/24 0617)  cefTRIAXone  (ROCEPHIN ) 1 g in sodium chloride  0.9 % 100 mL IVPB (has no administration in time range)  acetaminophen  (TYLENOL ) tablet 650 mg (has no administration in time range)    Or  acetaminophen  (TYLENOL ) suppository 650 mg (has no administration in time range)  traZODone  (DESYREL ) tablet 25 mg (has no administration in time range)   magnesium  hydroxide (MILK OF MAGNESIA) suspension 30 mL (has no administration in time range)  ondansetron  (ZOFRAN ) tablet 4 mg (has no administration in time range)    Or  ondansetron  (ZOFRAN ) injection 4 mg (has no administration in time range)  insulin  aspart (novoLOG ) injection 0-15 Units (has no administration in time range)  insulin  aspart (novoLOG ) injection 0-5 Units (has no administration in time range)  albuterol  (PROVENTIL ) (2.5 MG/3ML) 0.083% nebulizer solution 2.5 mg (has no administration in time range)  apixaban  (ELIQUIS ) tablet 5 mg (has no administration in time range)  lactated ringers  bolus 1,000 mL (0 mLs Intravenous Stopped 06/07/24 0134)  ceFEPIme  (MAXIPIME ) 2 g in sodium chloride  0.9 % 100 mL IVPB (0 g Intravenous Stopped 06/07/24 0024)  metroNIDAZOLE  (FLAGYL ) IVPB 500 mg (0 mg Intravenous Stopped 06/07/24 0133)  vancomycin  (VANCOCIN ) IVPB 1000 mg/200 mL premix (0 mg Intravenous Stopped 06/07/24 0251)  acetaminophen  (TYLENOL ) tablet 1,000 mg (1,000 mg Oral Given 06/06/24 2346)  lactated ringers  bolus 1,000 mL (0 mLs Intravenous Stopped 06/07/24 0232)  iohexol  (OMNIPAQUE ) 350 MG/ML injection 75 mL (75 mLs Intravenous Contrast Given 06/07/24 0039)     ED COURSE: Patient's labs show a leukocytosis of 10.7 with left shift.  Stable chronic kidney disease.  Normal electrolytes.  Troponin elevated at 27 which is likely secondary to demand ischemia but will continue to trend.  D-dimer elevated.  Will obtain CT of the chest to rule out PE.  Also has elevated liver function test but no abdominal tenderness on exam.  Will obtain CT of the abdomen pelvis.  Urine does appear infected with pyuria and bacteria.  Culture pending.  She is receiving broad-spectrum antibiotics.  Lactic normal.  Procalcitonin elevated consistent with sepsis.   Second troponin is 26.  Patient received IV fluids, antipyretics and is still tachycardic.  Will start diltiazem  for new onset A-fib with RVR.  CT scans  reviewed and interpreted by myself and the radiologist and show no PE, liver appears normal.  She has had a previous cholecystectomy.  She does have perinephric stranding bilaterally which could represent pyelonephritis but has no flank pain on exam.   Will discuss with the hospitalist for admission for urosepsis, possible pyelonephritis, new onset A-fib with RVR.   CONSULTS:  Consulted and discussed patient's case with hospitalist, Dr. Lawence.  I have recommended admission and consulting physician agrees and will place admission orders.  Patient (and family if present) agree with this plan.   I reviewed all nursing notes, vitals, pertinent previous records.  All labs, EKGs, imaging ordered have been independently reviewed and interpreted by myself.    OUTSIDE RECORDS REVIEWED: Reviewed recent internal medicine notes.       FINAL CLINICAL IMPRESSION(S) / ED DIAGNOSES   Final diagnoses:  Acute UTI  Acute sepsis (HCC)  Atrial fibrillation with RVR (HCC)     Rx / DC Orders   ED Discharge Orders     None        Note:  This document was prepared using Dragon voice recognition software and may include unintentional dictation errors.   Deni Lefever, Josette SAILOR, [place] 06/07/24 [phone]

## 2024-06-06 NOTE — Progress Notes (Signed)
 CODE SEPSIS - PHARMACY COMMUNICATION  **Broad Spectrum Antibiotics should be administered within 1 hour of Sepsis diagnosis**  Time Code Sepsis Called/Page Received: 2325  Antibiotics Ordered: Cefepime , Flagyl , & Vancomycin   Time of 1st antibiotic administration: 2350  Rankin CANDIE Dills, PharmD, [facility] 06/06/2024 11:30 PM

## 2024-06-07 ENCOUNTER — Emergency Department

## 2024-06-07 ENCOUNTER — Telehealth (HOSPITAL_COMMUNITY): Payer: Self-pay | Admitting: Pharmacy Technician

## 2024-06-07 ENCOUNTER — Inpatient Hospital Stay: Admit: 2024-06-07

## 2024-06-07 ENCOUNTER — Encounter: Payer: Self-pay | Admitting: Oncology

## 2024-06-07 ENCOUNTER — Other Ambulatory Visit (HOSPITAL_COMMUNITY): Payer: Self-pay

## 2024-06-07 ENCOUNTER — Inpatient Hospital Stay
Admit: 2024-06-07 | Discharge: 2024-06-07 | Disposition: A | Discharge status: Home / Self Care | Attending: Family Medicine | Admitting: Family Medicine

## 2024-06-07 DIAGNOSIS — I129 Hypertensive chronic kidney disease with stage 1 through stage 4 chronic kidney disease, or unspecified chronic kidney disease: Secondary | ICD-10-CM | POA: Diagnosis present

## 2024-06-07 DIAGNOSIS — A419 Sepsis, unspecified organism: Secondary | ICD-10-CM | POA: Diagnosis not present

## 2024-06-07 DIAGNOSIS — A4151 Sepsis due to Escherichia coli [E. coli]: Secondary | ICD-10-CM | POA: Diagnosis not present

## 2024-06-07 DIAGNOSIS — B961 Klebsiella pneumoniae [K. pneumoniae] as the cause of diseases classified elsewhere: Secondary | ICD-10-CM | POA: Diagnosis present

## 2024-06-07 DIAGNOSIS — E785 Hyperlipidemia, unspecified: Secondary | ICD-10-CM

## 2024-06-07 DIAGNOSIS — Z7901 Long term (current) use of anticoagulants: Secondary | ICD-10-CM | POA: Diagnosis not present

## 2024-06-07 DIAGNOSIS — I10 Essential (primary) hypertension: Secondary | ICD-10-CM | POA: Diagnosis not present

## 2024-06-07 DIAGNOSIS — I4891 Unspecified atrial fibrillation: Secondary | ICD-10-CM | POA: Diagnosis present

## 2024-06-07 DIAGNOSIS — Z79899 Other long term (current) drug therapy: Secondary | ICD-10-CM | POA: Diagnosis not present

## 2024-06-07 DIAGNOSIS — M109 Gout, unspecified: Secondary | ICD-10-CM | POA: Insufficient documentation

## 2024-06-07 DIAGNOSIS — I2489 Other forms of acute ischemic heart disease: Secondary | ICD-10-CM | POA: Diagnosis present

## 2024-06-07 DIAGNOSIS — Z7984 Long term (current) use of oral hypoglycemic drugs: Secondary | ICD-10-CM | POA: Diagnosis not present

## 2024-06-07 DIAGNOSIS — Z8249 Family history of ischemic heart disease and other diseases of the circulatory system: Secondary | ICD-10-CM | POA: Diagnosis not present

## 2024-06-07 DIAGNOSIS — Z794 Long term (current) use of insulin: Secondary | ICD-10-CM | POA: Diagnosis not present

## 2024-06-07 DIAGNOSIS — Z833 Family history of diabetes mellitus: Secondary | ICD-10-CM | POA: Diagnosis not present

## 2024-06-07 DIAGNOSIS — J4489 Other specified chronic obstructive pulmonary disease: Secondary | ICD-10-CM | POA: Diagnosis present

## 2024-06-07 DIAGNOSIS — K5730 Diverticulosis of large intestine without perforation or abscess without bleeding: Secondary | ICD-10-CM | POA: Diagnosis not present

## 2024-06-07 DIAGNOSIS — N390 Urinary tract infection, site not specified: Secondary | ICD-10-CM | POA: Diagnosis present

## 2024-06-07 DIAGNOSIS — A4150 Gram-negative sepsis, unspecified: Secondary | ICD-10-CM | POA: Diagnosis present

## 2024-06-07 DIAGNOSIS — I872 Venous insufficiency (chronic) (peripheral): Secondary | ICD-10-CM | POA: Diagnosis present

## 2024-06-07 DIAGNOSIS — N1832 Chronic kidney disease, stage 3b: Secondary | ICD-10-CM | POA: Diagnosis present

## 2024-06-07 DIAGNOSIS — E1122 Type 2 diabetes mellitus with diabetic chronic kidney disease: Secondary | ICD-10-CM | POA: Diagnosis present

## 2024-06-07 DIAGNOSIS — Z87891 Personal history of nicotine dependence: Secondary | ICD-10-CM | POA: Diagnosis not present

## 2024-06-07 DIAGNOSIS — Z885 Allergy status to narcotic agent status: Secondary | ICD-10-CM | POA: Diagnosis not present

## 2024-06-07 DIAGNOSIS — N83202 Unspecified ovarian cyst, left side: Secondary | ICD-10-CM | POA: Diagnosis not present

## 2024-06-07 DIAGNOSIS — Z6841 Body Mass Index (BMI) 40.0 and over, adult: Secondary | ICD-10-CM | POA: Diagnosis not present

## 2024-06-07 DIAGNOSIS — Z841 Family history of disorders of kidney and ureter: Secondary | ICD-10-CM | POA: Diagnosis not present

## 2024-06-07 DIAGNOSIS — E119 Type 2 diabetes mellitus without complications: Secondary | ICD-10-CM | POA: Diagnosis not present

## 2024-06-07 DIAGNOSIS — G4733 Obstructive sleep apnea (adult) (pediatric): Secondary | ICD-10-CM | POA: Diagnosis present

## 2024-06-07 DIAGNOSIS — D259 Leiomyoma of uterus, unspecified: Secondary | ICD-10-CM | POA: Diagnosis not present

## 2024-06-07 DIAGNOSIS — E66813 Obesity, class 3: Secondary | ICD-10-CM | POA: Diagnosis present

## 2024-06-07 LAB — ECHOCARDIOGRAM COMPLETE
AR max vel: 2.15 cm2
AV Area VTI: 2.24 cm2
AV Area mean vel: 2.2 cm2
AV Mean grad: 7 mmHg
AV Peak grad: 12.5 mmHg
Ao pk vel: 1.77 m/s
Area-P 1/2: 4.41 cm2
Height: 61 in
S' Lateral: 3.1 cm

## 2024-06-07 LAB — GLUCOSE, CAPILLARY: Glucose-Capillary: 158 mg/dL — ABNORMAL HIGH (ref 70–99)

## 2024-06-07 LAB — PROCALCITONIN: Procalcitonin: 20.23 ng/mL

## 2024-06-07 LAB — D-DIMER, QUANTITATIVE: D-Dimer, Quant: 3.83 ug{FEU}/mL — ABNORMAL HIGH (ref 0.00–0.50)

## 2024-06-07 LAB — TSH: TSH: 1.951 u[IU]/mL (ref 0.350–4.500)

## 2024-06-07 LAB — CBG MONITORING, ED
Glucose-Capillary: 159 mg/dL — ABNORMAL HIGH (ref 70–99)
Glucose-Capillary: 144 mg/dL — ABNORMAL HIGH (ref 70–99)
Glucose-Capillary: 196 mg/dL — ABNORMAL HIGH (ref 70–99)

## 2024-06-07 LAB — CORTISOL-AM, BLOOD: Cortisol - AM: 25.9 ug/dL — ABNORMAL HIGH (ref 6.7–22.6)

## 2024-06-07 LAB — TROPONIN I (HIGH SENSITIVITY): Troponin I (High Sensitivity): 26 ng/L — ABNORMAL HIGH

## 2024-06-07 MED ORDER — DAPAGLIFLOZIN PROPANEDIOL 10 MG PO TABS: 10.0000 mg | ORAL_TABLET | Freq: Every day | ORAL | Status: DC

## 2024-06-07 MED ORDER — INSULIN ASPART 100 UNIT/ML IJ SOLN
0.0000 [IU] | Freq: Three times a day (TID) | INTRAMUSCULAR | Status: DC
  Administered 2024-06-07 (×2): 3 [IU] via SUBCUTANEOUS
  Administered 2024-06-07 – 2024-06-08 (×2): 2 [IU] via SUBCUTANEOUS
  Administered 2024-06-08: 5 [IU] via SUBCUTANEOUS
  Administered 2024-06-08 – 2024-06-09 (×2): 3 [IU] via SUBCUTANEOUS
  Administered 2024-06-09: 2 [IU] via SUBCUTANEOUS
  Administered 2024-06-09: 5 [IU] via SUBCUTANEOUS
  Administered 2024-06-10 – 2024-06-11 (×6): 3 [IU] via SUBCUTANEOUS
  Administered 2024-06-12: 2 [IU] via SUBCUTANEOUS
  Administered 2024-06-12: 5 [IU] via SUBCUTANEOUS
  Administered 2024-06-13: 2 [IU] via SUBCUTANEOUS
  Administered 2024-06-13 – 2024-06-14 (×3): 3 [IU] via SUBCUTANEOUS
  Administered 2024-06-14 – 2024-06-16 (×4): 2 [IU] via SUBCUTANEOUS
  Administered 2024-06-16: 3 [IU] via SUBCUTANEOUS
  Filled 2024-06-07 (×5): qty 1
  Filled 2024-06-07: qty 2
  Filled 2024-06-07 (×9): qty 1
  Filled 2024-06-07: qty 3
  Filled 2024-06-07 (×4): qty 1
  Filled 2024-06-07: qty 3
  Filled 2024-06-07 (×4): qty 1

## 2024-06-07 MED ORDER — MAGNESIUM SULFATE 2 GM/50ML IV SOLN
2.0000 g | Freq: Once | INTRAVENOUS | Status: AC
  Administered 2024-06-07: 2 g via INTRAVENOUS
  Filled 2024-06-07: qty 50

## 2024-06-07 MED ORDER — SODIUM CHLORIDE 0.9 % IV SOLN
1.0000 g | INTRAVENOUS | Status: DC
  Administered 2024-06-07 – 2024-06-08 (×2): 1 g via INTRAVENOUS
  Filled 2024-06-07 (×2): qty 10

## 2024-06-07 MED ORDER — DILTIAZEM HCL-DEXTROSE 125-5 MG/125ML-% IV SOLN (PREMIX)
5.0000 mg/h | INTRAVENOUS | Status: DC
  Administered 2024-06-07 – 2024-06-08 (×2): 5 mg/h via INTRAVENOUS
  Administered 2024-06-09: 2.5 mg/h via INTRAVENOUS
  Filled 2024-06-07 (×3): qty 125

## 2024-06-07 MED ORDER — ALBUTEROL SULFATE (2.5 MG/3ML) 0.083% IN NEBU
2.5000 mg | INHALATION_SOLUTION | Freq: Four times a day (QID) | RESPIRATORY_TRACT | Status: DC | PRN
  Administered 2024-06-07 – 2024-06-10 (×2): 2.5 mg via RESPIRATORY_TRACT
  Filled 2024-06-07 (×2): qty 3

## 2024-06-07 MED ORDER — ALLOPURINOL 100 MG PO TABS
100.0000 mg | ORAL_TABLET | Freq: Two times a day (BID) | ORAL | Status: DC
  Administered 2024-06-07 – 2024-06-08 (×3): 100 mg via ORAL
  Filled 2024-06-07 (×4): qty 1

## 2024-06-07 MED ORDER — CYANOCOBALAMIN 500 MCG PO TABS
500.0000 ug | ORAL_TABLET | Freq: Every day | ORAL | Status: DC
  Administered 2024-06-07 – 2024-06-16 (×10): 500 ug via ORAL
  Filled 2024-06-07 (×10): qty 1

## 2024-06-07 MED ORDER — ONDANSETRON HCL 4 MG/2ML IJ SOLN: 4.0000 mg | Freq: Four times a day (QID) | INTRAMUSCULAR | Status: DC | PRN

## 2024-06-07 MED ORDER — MAGNESIUM HYDROXIDE 400 MG/5ML PO SUSP: 30.0000 mL | Freq: Every day | ORAL | Status: DC | PRN

## 2024-06-07 MED ORDER — ONDANSETRON HCL 4 MG PO TABS: 4.0000 mg | ORAL_TABLET | Freq: Four times a day (QID) | ORAL | Status: DC | PRN

## 2024-06-07 MED ORDER — LACTATED RINGERS IV BOLUS
1000.0000 mL | Freq: Once | INTRAVENOUS | Status: AC
  Administered 2024-06-07: 1000 mL via INTRAVENOUS

## 2024-06-07 MED ORDER — IOHEXOL 350 MG/ML SOLN
75.0000 mL | Freq: Once | INTRAVENOUS | Status: AC | PRN
  Administered 2024-06-07: 75 mL via INTRAVENOUS

## 2024-06-07 MED ORDER — ATORVASTATIN CALCIUM 20 MG PO TABS
20.0000 mg | ORAL_TABLET | Freq: Every day | ORAL | Status: DC
  Administered 2024-06-07 – 2024-06-16 (×10): 20 mg via ORAL
  Filled 2024-06-07 (×7): qty 1
  Filled 2024-06-07: qty 2
  Filled 2024-06-07 (×3): qty 1

## 2024-06-07 MED ORDER — TRAZODONE HCL 50 MG PO TABS
25.0000 mg | ORAL_TABLET | Freq: Every evening | ORAL | Status: DC | PRN
  Administered 2024-06-07: 25 mg via ORAL
  Filled 2024-06-07 (×2): qty 1

## 2024-06-07 MED ORDER — VITAMIN D3 25 MCG (1000 UNIT) PO TABS
1000.0000 [IU] | ORAL_TABLET | Freq: Every day | ORAL | Status: DC
  Administered 2024-06-07 – 2024-06-16 (×10): 1000 [IU] via ORAL
  Filled 2024-06-07 (×20): qty 1

## 2024-06-07 MED ORDER — INSULIN ASPART 100 UNIT/ML IJ SOLN: 0.0000 [IU] | Freq: Every day | INTRAMUSCULAR | Status: DC

## 2024-06-07 MED ORDER — ACETAMINOPHEN 650 MG RE SUPP: 650.0000 mg | Freq: Four times a day (QID) | RECTAL | Status: DC | PRN

## 2024-06-07 MED ORDER — POLYETHYLENE GLYCOL 3350 17 G PO PACK: 17.0000 g | PACK | Freq: Every day | ORAL | Status: DC

## 2024-06-07 MED ORDER — APIXABAN 5 MG PO TABS
5.0000 mg | ORAL_TABLET | Freq: Two times a day (BID) | ORAL | Status: DC
  Administered 2024-06-07 – 2024-06-09 (×5): 5 mg via ORAL
  Filled 2024-06-07 (×5): qty 1

## 2024-06-07 MED ORDER — ACETAMINOPHEN 325 MG PO TABS
650.0000 mg | ORAL_TABLET | Freq: Four times a day (QID) | ORAL | Status: DC | PRN
  Administered 2024-06-07 – 2024-06-08 (×2): 650 mg via ORAL
  Filled 2024-06-07 (×3): qty 2

## 2024-06-07 MED ORDER — TRAMADOL HCL 50 MG PO TABS: 25.0000 mg | ORAL_TABLET | Freq: Two times a day (BID) | ORAL | Status: DC | PRN

## 2024-06-07 MED ORDER — LACTATED RINGERS IV SOLN
150.0000 mL/h | INTRAVENOUS | Status: DC
  Administered 2024-06-07 (×2): 150 mL/h via INTRAVENOUS

## 2024-06-07 MED ORDER — METOPROLOL TARTRATE 25 MG PO TABS
25.0000 mg | ORAL_TABLET | Freq: Four times a day (QID) | ORAL | Status: DC
  Administered 2024-06-07 – 2024-06-12 (×20): 25 mg via ORAL
  Filled 2024-06-07 (×20): qty 1

## 2024-06-07 MED ORDER — ALBUTEROL SULFATE HFA 108 (90 BASE) MCG/ACT IN AERS: 2.0000 | INHALATION_SPRAY | Freq: Four times a day (QID) | RESPIRATORY_TRACT | Status: DC | PRN

## 2024-06-07 MED ORDER — POTASSIUM CHLORIDE CRYS ER 20 MEQ PO TBCR
20.0000 meq | EXTENDED_RELEASE_TABLET | Freq: Every day | ORAL | Status: DC
  Administered 2024-06-07 – 2024-06-16 (×10): 20 meq via ORAL
  Filled 2024-06-07 (×10): qty 1

## 2024-06-07 NOTE — Telephone Encounter (Signed)
 Patient Product/process development scientist completed.    The patient is insured through [employer]. Patient has Medicare and is not eligible for a copay card, but may be able to apply for patient assistance or Medicare RX Payment Plan (Patient Must reach out to their plan, if eligible for payment plan), if available.    Ran test claim for Eliquis  5 mg and the current 30 day co-pay is $198.32 due to a deductible.  Will be $45.00 once deductible is met.   This test claim was processed through [facility]- copay amounts may vary at other pharmacies due to pharmacy/plan contracts, or as the patient moves through the different stages of their insurance plan.     Reyes Sharps, CPHT Pharmacy Technician III Certified Patient Advocate [facility] Patient Advocate Team Direct Number: [phone]  Fax: [phone]

## 2024-06-07 NOTE — ED Notes (Signed)
 Patient placed on CCMD cardiac monitoring by this RN

## 2024-06-07 NOTE — ED Notes (Signed)
 Pt placed on bedpan

## 2024-06-07 NOTE — Assessment & Plan Note (Signed)
 Will continue statin therapy

## 2024-06-07 NOTE — Assessment & Plan Note (Signed)
Will continue allopurinol. 

## 2024-06-07 NOTE — Assessment & Plan Note (Signed)
-   Will continue IV Cardizem  drip. - CHA2DS2-VASc score is 5. - Will obtain a 2D echo and cardiology consult. - Will place the patient on p.o. Eliquis . - I notified Dr. Florencio about

## 2024-06-07 NOTE — Assessment & Plan Note (Signed)
-   The patient will be placed on supplemental coverage with NovoLog . - Will continue Farxiga  and hold off metformin .

## 2024-06-07 NOTE — Consult Note (Signed)
 [facility] CLINIC CARDIOLOGY CONSULT NOTE       Patient ID: Amber Avery MRN: [id number] DOB/AGE: 77-Dec-1947 76 y.o.  Admit date: 06/06/2024 Referring Physician Dr. Madison Peaches Primary Physician Sowles, Krichna, MD  Primary Cardiologist Dr. Florencio (last seen 2016) Reason for Consultation AF RVR  HPI: Amber Avery is a 78 y.o. female  with a past medical history of hypertension, hyperlipidemia, type 2 diabetes, gout, obstructive sleep apnea who presented to the ED on 06/06/2024 for weakness and fatigue.  Noted to be in atrial fibrillation RVR in the ED.  Cardiology was consulted for further evaluation.   Patient states that for roughly 1 to 2 weeks prior to coming to the ED she has been having worsening symptoms of weakness and fatigue.  States that she has also had some falls because of this.  Denies having any dysuria symptoms, but given her persistent weakness she decided to come to the ED for further evaluation.  UA concerning for UTI in the ED.  Additional workup in the ED notable for creatinine 1.54, potassium 4.2, hemoglobin 14.1, WBC 10.7.  Troponins 27 > 26. EKG in the ED atrial fibrillation RVR rate 136 bpm.  No significant abnormalities on any imaging.  Started on IV diltiazem , IV antibiotics in the ED.  At the time my evaluation this morning patient is resting in ED stretcher.  We discussed her symptoms in further detail.  Only complaining of worsening fatigue prior to coming to the hospital.  Denies any chest pain, shortness of breath, palpitations, dysuria, dizziness, lightheadedness, syncope.  Feeling about the same today.  States she is asymptomatic with her atrial fibrillation and denies any heart racing symptoms.  Review of systems complete and found to be negative unless listed above    Past Medical History:  Diagnosis Date   Allergy    Arthritis    Asthma    Diabetes mellitus without complication (HCC)    Gout    Hyperlipidemia    Hypertension    Iron  deficiency  anemia 04/20/2018   Oxygen  deficiency    PONV (postoperative nausea and vomiting)    after hemorroid surgery   Shortness of breath dyspnea    Sleep apnea     Past Surgical History:  Procedure Laterality Date   APPENDECTOMY     CATARACT EXTRACTION W/PHACO Left 08/14/2020   Procedure: CATARACT EXTRACTION PHACO AND INTRAOCULAR LENS PLACEMENT (IOC) LEFT DIABETIC 3.56 00:59.6 6.0%;  Surgeon: Mittie Gaskin, MD;  Location: [facility];  Service: Ophthalmology;  Laterality: Left;  Diabetic   CATARACT EXTRACTION W/PHACO Right 09/04/2020   Procedure: CATARACT EXTRACTION PHACO AND INTRAOCULAR LENS PLACEMENT (IOC) RIGHT DIABETIC;  Surgeon: Mittie Gaskin, MD;  Location: [facility];  Service: Ophthalmology;  Laterality: Right;  4.72 1:03.2 7.4%   CHOLECYSTECTOMY  10/20/1975   COLONOSCOPY WITH PROPOFOL  N/A 01/21/2016   Procedure: COLONOSCOPY WITH PROPOFOL ;  Surgeon: Rogelia Copping, MD;  Location: [facility];  Service: Endoscopy;  Laterality: N/A;   DILATION AND CURETTAGE OF UTERUS     Due to Amenorrhea   ESOPHAGOGASTRODUODENOSCOPY (EGD) WITH PROPOFOL  N/A 08/16/2018   Procedure: ESOPHAGOGASTRODUODENOSCOPY (EGD) WITH PROPOFOL ;  Surgeon: Copping Rogelia, MD;  Location: [facility];  Service: Endoscopy;  Laterality: N/A;   EYE SURGERY     HEMORRHOID BANDING  10/19/1978   HEMORROIDECTOMY     HERNIA REPAIR  10/19/2009   Temporal Area Excision Biopsy     For Birth Mark Changes, Negative Pathology   TONSILLECTOMY AND ADENOIDECTOMY      (  Not in a hospital admission)  Social History   Socioeconomic History   Marital status: Divorced    Spouse name: Not on file   Number of children: 0   Years of education: Not on file   Highest education level: GED or equivalent  Occupational History   Occupation: Retired  Tobacco Use   Smoking status: Former    Current packs/day: 0.00    Average packs/day: 2.0 packs/day for 15.0 years (30.0 ttl pk-yrs)    Types: Cigarettes     Start date: 48    Quit date: 1983    Years since quitting: 42.6   Smokeless tobacco: Never   Tobacco comments:    smoking cessation materials not required  Vaping Use   Vaping status: Never Used  Substance and Sexual Activity   Alcohol use: Not Currently    Alcohol/week: 0.0 standard drinks of alcohol   Drug use: No   Sexual activity: Not Currently  Other Topics Concern   Not on file  Social History Narrative   Patient just moved in with her youngest sister and a roommate    Social Drivers of Corporate investment banker Strain: Low Risk  (05/25/2024)   Overall Financial Resource Strain (CARDIA)    Difficulty of Paying Living Expenses: Not very hard  Food Insecurity: No Food Insecurity (05/25/2024)   Hunger Vital Sign    Worried About Running Out of Food in the Last Year: Never true    Ran Out of Food in the Last Year: Never true  Recent Concern: Food Insecurity - Food Insecurity Present (05/02/2024)   Hunger Vital Sign    Worried About Running Out of Food in the Last Year: Never true    Ran Out of Food in the Last Year: Sometimes true  Transportation Needs: No Transportation Needs (05/25/2024)   PRAPARE - Administrator, Civil Service (Medical): No    Lack of Transportation (Non-Medical): No  Physical Activity: Insufficiently Active (05/25/2024)   Exercise Vital Sign    Days of Exercise per Week: 3 days    Minutes of Exercise per Session: 10 min  Stress: No Stress Concern Present (05/25/2024)   [employer] of Occupational Health - Occupational Stress Questionnaire    Feeling of Stress: Not at all  Social Connections: Socially Isolated (05/25/2024)   Social Connection and Isolation Panel    Frequency of Communication with Friends and Family: Once a week    Frequency of Social Gatherings with Friends and Family: Once a week    Attends Religious Services: Never    Database administrator or Organizations: No    Attends Banker Meetings: Never    Marital  Status: Divorced  Catering manager Violence: Not At Risk (05/25/2024)   Humiliation, Afraid, Rape, and Kick questionnaire    Fear of Current or Ex-Partner: No    Emotionally Abused: No    Physically Abused: No    Sexually Abused: No    Family History  Problem Relation Age of Onset   Cancer Mother        brain tumor   Kidney disease Mother    Hypertension Mother    Heart disease Father    COPD Father    Hearing loss Father    Hypertension Sister    Arthritis Sister    Cancer Sister    Heart murmur Sister    GER disease Sister    Lupus Sister    Diabetes Sister    Arthritis  Sister    Osteopenia Sister    Heart disease Sister    Hypertension Sister    Hearing loss Sister    Obesity Sister    Breast cancer Maternal Aunt 24   Leukemia Maternal Aunt      Vitals:   06/07/24 1015 06/07/24 1030 06/07/24 1045 06/07/24 1100  BP:  128/82  113/72  Pulse: (!) 119 (!) 107 100 81  Resp: (!) 25 20 (!) 25 (!) 27  Temp:      TempSrc:      SpO2: 97% 97% 93% 90%  Height:        PHYSICAL EXAM General: Ill-appearing female, well nourished, in no acute distress. HEENT: Normocephalic and atraumatic. Neck: No JVD.  Lungs: Normal respiratory effort. Clear bilaterally to auscultation. No wheezes, crackles, rhonchi.  Heart: Irregularly irregular, elevated rate. Normal S1 and S2 without gallops or murmurs.  Abdomen: Non-distended appearing.  Msk: Normal strength and tone for age. Extremities: Warm and well perfused. No clubbing, cyanosis.  No edema.  Neuro: Alert and oriented X 3. Psych: Answers questions appropriately.   Labs: Basic Metabolic Panel: Recent Labs    06/06/24 2259  NA 133*  K 4.2  CL 98  CO2 20*  GLUCOSE 166*  BUN 46*  CREATININE 1.54*  CALCIUM  8.7*  MG 2.0   Liver Function Tests: Recent Labs    06/06/24 2259  AST 83*  ALT 54*  ALKPHOS 86  BILITOT 1.3*  PROT 6.9  ALBUMIN 3.2*   No results for input(s): LIPASE, AMYLASE in the last 72  hours. CBC: Recent Labs    06/06/24 2259  WBC 10.7*  NEUTROABS 8.4*  HGB 14.1  HCT 42.5  MCV 93.8  PLT 142*   Cardiac Enzymes: Recent Labs    06/06/24 2259 06/07/24 0216  TROPONINIHS 27* 26*   BNP: No results for input(s): BNP in the last 72 hours. D-Dimer: Recent Labs    06/06/24 2259  DDIMER 3.83*   Hemoglobin A1C: No results for input(s): HGBA1C in the last 72 hours. Fasting Lipid Panel: No results for input(s): CHOL, HDL, LDLCALC, TRIG, CHOLHDL, LDLDIRECT in the last 72 hours. Thyroid  Function Tests: Recent Labs    06/06/24 2259  TSH 1.951   Anemia Panel: No results for input(s): VITAMINB12, FOLATE, FERRITIN, TIBC, IRON , RETICCTPCT in the last 72 hours.   Radiology: CT Angio Chest PE W and/or Wo Contrast Result Date: 06/07/2024 CLINICAL DATA:  Pulmonary embolism (PE) suspected, low to intermediate prob, positive D-dimer. Suspected sepsis. EXAM: CT ANGIOGRAPHY CHEST WITH CONTRAST TECHNIQUE: Multidetector CT imaging of the chest was performed using the standard protocol during bolus administration of intravenous contrast. Multiplanar CT image reconstructions and MIPs were obtained to evaluate the vascular anatomy. RADIATION DOSE REDUCTION: This exam was performed according to the departmental dose-optimization program which includes automated exposure control, adjustment of the mA and/or kV according to patient size and/or use of iterative reconstruction technique. CONTRAST:  75mL OMNIPAQUE  IOHEXOL  350 MG/ML SOLN COMPARISON:  12/23/2014 FINDINGS: Cardiovascular: No filling defects in the pulmonary arteries to suggest pulmonary emboli. Heart is normal size. Aorta is normal caliber. Scattered coronary artery and aortic calcifications. Mediastinum/Nodes: No mediastinal, hilar, or axillary adenopathy. Trachea and esophagus are unremarkable. Multiple calcified nodules in the thyroid  are unchanged since prior study. Lungs/Pleura: No confluent airspace  opacities or effusions. Upper Abdomen: No acute findings Musculoskeletal: Chest wall soft tissues are unremarkable. No acute bony abnormality. Review of the MIP images confirms the above findings. IMPRESSION: No evidence of  pulmonary embolus. Coronary artery disease. No acute cardiopulmonary disease. Aortic Atherosclerosis (ICD10-I70.0). Electronically Signed   By: Franky Crease M.D.   On: 06/07/2024 01:01   CT ABDOMEN PELVIS W CONTRAST Result Date: 06/07/2024 CLINICAL DATA:  Sepsis EXAM: CT ABDOMEN AND PELVIS WITH CONTRAST TECHNIQUE: Multidetector CT imaging of the abdomen and pelvis was performed using the standard protocol following bolus administration of intravenous contrast. RADIATION DOSE REDUCTION: This exam was performed according to the departmental dose-optimization program which includes automated exposure control, adjustment of the mA and/or kV according to patient size and/or use of iterative reconstruction technique. CONTRAST:  75mL OMNIPAQUE  IOHEXOL  350 MG/ML SOLN COMPARISON:  None Available. FINDINGS: Lower chest: No acute abnormality Hepatobiliary: No focal liver abnormality is seen. Status post cholecystectomy. No biliary dilatation. Pancreas: No focal abnormality or ductal dilatation. Spleen: No focal abnormality.  Normal size. Adrenals/Urinary Tract: Adrenal glands normal. No renal mass, stone or hydronephrosis. Nonspecific bilateral perinephric stranding. Urinary bladder decompressed, grossly unremarkable. Stomach/Bowel: Left colonic diverticulosis. No active diverticulitis. Stomach and small bowel decompressed. No bowel obstruction or inflammatory process. Vascular/Lymphatic: No evidence of aneurysm or adenopathy. Reproductive: 6 cm partially calcified left uterine fibroid. 2.8 cm cyst in the left ovary. Right ovary unremarkable. Other: No free fluid or free air. Musculoskeletal: No acute bony abnormality. IMPRESSION: No acute findings in the abdomen or pelvis. Nonspecific bilateral  perinephric stranding which may be related to remote insult. No hydronephrosis or stones. Aortic atherosclerosis. Left colonic diverticulosis. Electronically Signed   By: Franky Crease M.D.   On: 06/07/2024 00:59   CT HEAD WO CONTRAST ( ) Result Date: 06/06/2024 CLINICAL DATA:  Headache, new onset (Age >= 51y) generalized weakness and headache EXAM: CT HEAD WITHOUT CONTRAST TECHNIQUE: Contiguous axial images were obtained from the base of the skull through the vertex without intravenous contrast. RADIATION DOSE REDUCTION: This exam was performed according to the departmental dose-optimization program which includes automated exposure control, adjustment of the mA and/or kV according to patient size and/or use of iterative reconstruction technique. COMPARISON:  CT head 12/23/2014 FINDINGS: Brain: No evidence of large-territorial acute infarction. No parenchymal hemorrhage. No mass lesion. No extra-axial collection. No mass effect or midline shift. No hydrocephalus. Basilar cisterns are patent. Vascular: No hyperdense vessel. Atherosclerotic calcifications are present within the cavernous internal carotid arteries. Skull: No acute fracture or focal lesion. Sinuses/Orbits: Paranasal sinuses and mastoid air cells are clear. Bilateral lens replacement. Otherwise the orbits are unremarkable. Other: None. IMPRESSION: No acute intracranial abnormality. Electronically Signed   By: Morgane  Naveau M.D.   On: 06/06/2024 23:35   DG Chest Port 1 View if patient is in a treatment room. Result Date: 06/06/2024 CLINICAL DATA:  Suspected sepsis, former smoker, asthma EXAM: PORTABLE CHEST 1 VIEW COMPARISON:  12/23/2014 FINDINGS: Stable cardiomediastinal silhouette. Aortic atherosclerotic calcification. Chronic interstitial coarsening. No focal consolidation, pleural effusion, or pneumothorax. No displaced rib fractures. IMPRESSION: Chronic interstitial coarsening.  No acute abnormality. Electronically Signed   By: Norman Gatlin M.D.   On: 06/06/2024 23:20    ECHO pending  TELEMETRY reviewed by me 06/07/2024: Atrial fibrillation 110s  EKG reviewed by me: Atrial fibrillation RVR rate 136 bpm  Data reviewed by me 06/07/2024: last 24h vitals tele labs imaging I/O ED provider note, admission H&P  Principal Problem:   Sepsis due to gram-negative UTI [facility]) Active Problems:   Dyslipidemia   Atrial fibrillation with rapid ventricular response (HCC)   Controlled type 2 diabetes mellitus without complication, without long-term current use of insulin  (HCC)  Gout    ASSESSMENT AND PLAN:  Amber Avery is a 78 y.o. female  with a past medical history of hypertension, hyperlipidemia, type 2 diabetes, gout, obstructive sleep apnea who presented to the ED on 06/06/2024 for weakness and fatigue.  Noted to be in atrial fibrillation RVR in the ED.  Cardiology was consulted for further evaluation.   # Atrial fibrillation RVR # New onset atrial fibrillation  # Sepsis 2/2 UTI # Hypertension # Hyperlipidemia Patient presented with worsening weakness, found to have UTI on UA.  Also noted to be in atrial fibrillation RVR on EKG.  Started on IV diltiazem  in the ED. - Echo pending - Start metoprolol  tartrate 25 mg every 6 hours.  Consolidate as able. - Will give IV magnesium  2 g. - Wean off of IV diltiazem  as able. - Continue Eliquis  5 mg twice daily. - Continue atorvastatin  20 mg daily. - Minimally elevated and flat trending troponin most consistent with demand/supply mismatch and not ACS  - Continue to treat causes of increased adrenergic tone including fever, infection, hypoxia, pain, etc.     This patient's plan of care was discussed and created with Dr. Florencio and he is in agreement.  Signed: Danita Bloch, PA-C  06/07/2024, 12:28 PM [facility]

## 2024-06-07 NOTE — Hospital Course (Signed)
 Uncomfortable. Small bm yesterday.   Some pain after voiding, no burning with urination. Poor appetite over the last couple of days.    Physical Exam  Constitutional: In no distress.  Cardiovascular: Normal rate, regular rhythm. No lower extremity edema  Pulmonary: Non labored breathing on [place] no wheezing or rales.  No lower extremity edema Abdominal: Soft. Obese, NT, ND  Musculoskeletal: Normal range of motion.     Neurological: Alert and oriented to person, place, and time. Non focal  Skin: Skin is warm and dry.

## 2024-06-07 NOTE — H&P (Signed)
 [place]   PATIENT NAME: Amber Avery    MR#:  [id number]  DATE OF BIRTH:  [birth year]  DATE OF ADMISSION:  06/06/2024  PRIMARY CARE PHYSICIAN: Sowles, Krichna, MD   Patient is coming from: Home  REQUESTING/REFERRING PHYSICIAN: Ward, Josette SAILOR, DO  CHIEF COMPLAINT:   Chief Complaint  Patient presents with   Weakness    HISTORY OF PRESENT ILLNESS:  Amber Avery is a 78 y.o. obese Caucasian female with medical history significant for asthma, type diabetes mellitus, gout, hypertension, dyslipidemia and OSA, who presented to the emergency room with acute onset of malaise and feeling sick since Tuesday with diaphoresis.  She had chills on Saturday.  She admits to urinary frequency and urgency without dysuria or hematuria or flank pain.  No chest pain or palpitations.  No dyspnea or cough or wheezing.  No nausea or vomiting or abdominal pain.  She denies any history of atrial fibrillation.  ED Course: When she came to the ER, heart rate was 134 and she was in atrial fibrillation with a BP 104/92 and respiratory rate of 29 and pulse oximetry 93 to 94% on room air.  Labs revealed sodium of 133 with a CO2 of 20 glucose of 166 and a BUN of 46 creatinine 1.54 above previous levels with albumin of 3.2, and AST of 83 and ALT of 54 with total bili 1.3 and calcium  was 8.7.  Lactic acid was 1.5 and procalcitonin 20.2.  CBC showed WBCs of 10.7 and platelets 142.  D-dimer was 3.83 and TSH was 1.95 UA was positive for UTI.  Blood cultures and urine culture was sent. EKG as reviewed by me : Atrial fibrillation with RVR. Imaging: Noncontrasted head CT scan showed no acute intracranial abnormality.  Abdominal and pelvic CT scan with contrast revealed no acute findings.  It showed nonspecific bilateral perinephric stranding that may be related to remote insult and aortic atherosclerosis and left colonic diverticulosis.  CTA of the chest revealed no evidence for PE.  It showed coronary artery disease  with no acute cardiopulmonary disease.  It showed aortic atherosclerosis.  Portable chest x-ray showed chronic interstitial coarsening with no acute abnormality.  The patient was given broad-spectrum antibiotic therapy with IV cefepime , Flagyl  and vancomycin  and placed on IV Cardizem  drip as well as given 1 L bolus of IV lactated ringer .  She will be admitted to a progressive unit bed for further evaluation and management. PAST MEDICAL HISTORY:   Past Medical History:  Diagnosis Date   Allergy    Arthritis    Asthma    Diabetes mellitus without complication (HCC)    Gout    Hyperlipidemia    Hypertension    Iron  deficiency anemia 04/20/2018   Oxygen  deficiency    PONV (postoperative nausea and vomiting)    after hemorroid surgery   Shortness of breath dyspnea    Sleep apnea     PAST SURGICAL HISTORY:   Past Surgical History:  Procedure Laterality Date   APPENDECTOMY     CATARACT EXTRACTION W/PHACO Left 08/14/2020   Procedure: CATARACT EXTRACTION PHACO AND INTRAOCULAR LENS PLACEMENT (IOC) LEFT DIABETIC 3.56 00:59.6 6.0%;  Surgeon: Mittie Gaskin, MD;  Location: [facility];  Service: Ophthalmology;  Laterality: Left;  Diabetic   CATARACT EXTRACTION W/PHACO Right 09/04/2020   Procedure: CATARACT EXTRACTION PHACO AND INTRAOCULAR LENS PLACEMENT (IOC) RIGHT DIABETIC;  Surgeon: Mittie Gaskin, MD;  Location: [facility];  Service: Ophthalmology;  Laterality: Right;  4.72  1:03.2 7.4%   CHOLECYSTECTOMY  10/20/1975   COLONOSCOPY WITH PROPOFOL  N/A 01/21/2016   Procedure: COLONOSCOPY WITH PROPOFOL ;  Surgeon: Rogelia Copping, MD;  Location: [facility];  Service: Endoscopy;  Laterality: N/A;   DILATION AND CURETTAGE OF UTERUS     Due to Amenorrhea   ESOPHAGOGASTRODUODENOSCOPY (EGD) WITH PROPOFOL  N/A 08/16/2018   Procedure: ESOPHAGOGASTRODUODENOSCOPY (EGD) WITH PROPOFOL ;  Surgeon: Copping Rogelia, MD;  Location: [facility];  Service: Endoscopy;  Laterality:  N/A;   EYE SURGERY     HEMORRHOID BANDING  10/19/1978   HEMORROIDECTOMY     HERNIA REPAIR  10/19/2009   Temporal Area Excision Biopsy     For Birth Mark Changes, Negative Pathology   TONSILLECTOMY AND ADENOIDECTOMY      SOCIAL HISTORY:   Social History   Tobacco Use   Smoking status: Former    Current packs/day: 0.00    Average packs/day: 2.0 packs/day for 15.0 years (30.0 ttl pk-yrs)    Types: Cigarettes    Start date: 47    Quit date: 46    Years since quitting: 42.6   Smokeless tobacco: Never   Tobacco comments:    smoking cessation materials not required  Substance Use Topics   Alcohol use: Not Currently    Alcohol/week: 0.0 standard drinks of alcohol    FAMILY HISTORY:   Family History  Problem Relation Age of Onset   Cancer Mother        brain tumor   Kidney disease Mother    Hypertension Mother    Heart disease Father    COPD Father    Hearing loss Father    Hypertension Sister    Arthritis Sister    Cancer Sister    Heart murmur Sister    GER disease Sister    Lupus Sister    Diabetes Sister    Arthritis Sister    Osteopenia Sister    Heart disease Sister    Hypertension Sister    Hearing loss Sister    Obesity Sister    Breast cancer Maternal Aunt 2   Leukemia Maternal Aunt     DRUG ALLERGIES:   Allergies  Allergen Reactions   Codeine Hives and Nausea And Vomiting    REVIEW OF SYSTEMS:   ROS As per history of present illness. All pertinent systems were reviewed above. Constitutional, HEENT, cardiovascular, respiratory, GI, GU, musculoskeletal, neuro, psychiatric, endocrine, integumentary and hematologic systems were reviewed and are otherwise negative/unremarkable except for positive findings mentioned above in the HPI.   MEDICATIONS AT HOME:   Prior to Admission medications   Medication Sig Start Date End Date Taking? Authorizing Provider  acetaminophen  (TYLENOL ) 500 MG tablet Take 1 tablet (500 mg total) by mouth every 6 (six)  hours as needed. Patient taking differently: Take 1,000 mg by mouth 3 (three) times daily. 08/09/17  Yes Sowles, Krichna, MD  allopurinol  (ZYLOPRIM ) 100 MG tablet Take 1 tablet (100 mg total) by mouth 2 (two) times daily. 05/05/24  Yes Sowles, Krichna, MD  atorvastatin  (LIPITOR) 20 MG tablet TAKE 1 TABLET BY MOUTH DAILY FOR CHOLESTEROL 05/05/24  Yes Sowles, Krichna, MD  Blood Pressure KIT 1 each by Does not apply route daily. 07/07/22  Yes Sowles, Krichna, MD  clobetasol  ointment (TEMOVATE ) 0.05 % Apply 1 Application topically at bedtime. Apply to the affected area nightly for 4 weeks.   Yes [provider]  dapagliflozin  propanediol (FARXIGA ) 10 MG TABS tablet Take 1 tablet (10 mg total) by mouth daily before breakfast. 05/17/23  Yes Sowles, Krichna, MD  Dulaglutide  (TRULICITY ) 4.5 MG/0.5ML SOAJ Inject 4.5 mg as directed once a week. Patient taking differently: Inject 5 mg as directed once a week. 05/08/24  Yes Sowles, Krichna, MD  furosemide  (LASIX ) 20 MG tablet Take 1 tablet (20 mg total) by mouth 2 (two) times daily. Second dose around noon 01/05/24  Yes Sowles, Krichna, MD  gabapentin  (NEURONTIN ) 300 MG capsule TAKE 1 CAPSULE BY MOUTH IN THE MORNING, 1 CAPSULE IN THE EVENING AND 2 CAPSULES AT NIGHT 04/14/23  Yes Sowles, Krichna, MD  glucose blood (ONETOUCH VERIO) test strip USE AS DIRECTED 07/07/22  Yes Sowles, Krichna, MD  Lancets [facility] ULTRASOFT) lancets Use as instructed 07/07/22  Yes Sowles, Krichna, MD  metFORMIN  (GLUCOPHAGE -XR) 750 MG 24 hr tablet Take 1 tablet (750 mg total) by mouth daily with breakfast. 05/05/24  Yes Sowles, Krichna, MD  methocarbamol  (ROBAXIN ) 500 MG tablet Take 500 mg by mouth 3 (three) times daily.   Yes Chasnis, Morene, MD  potassium chloride  SA (KLOR-CON  M) 20 MEQ tablet TAKE 1 TABLET BY MOUTH DAILY 05/08/24  Yes Sowles, Krichna, MD  traMADol  (ULTRAM ) 50 MG tablet Take 25-50 mg by mouth 2 (two) times daily as needed. 03/03/22  Yes Chasnis, Morene, MD   triamcinolone  ointment (KENALOG ) 0.1 % APPLY TOPICALLY TWICE DAILY AS NEEDED. GENERIC EQUIVALENT FOR KENALOG  02/08/24  Yes Sowles, Krichna, MD  vitamin B-12 (CYANOCOBALAMIN ) 500 MCG tablet Take 500 mcg by mouth daily.   Yes [provider]  Vitamin D , Cholecalciferol , 25 MCG (1000 UT) TABS Take 1 capsule by mouth daily. 12/14/19  Yes Sowles, Krichna, MD  VOLTAREN  1 % GEL Apply 4 g topically 4 (four) times daily. 11/24/23  Yes Sowles, Krichna, MD  albuterol  (VENTOLIN  HFA) 108 (90 Base) MCG/ACT inhaler Inhale 2 puffs into the lungs every 6 (six) hours as needed for wheezing or shortness of breath. 02/23/23   Sowles, Krichna, MD      VITAL SIGNS:  Blood pressure 113/83, pulse (!) 105, temperature 98.7 F (37.1 C), temperature source Oral, resp. rate 19, height 5' 1 (1.549 m), SpO2 99%.  PHYSICAL EXAMINATION:  Physical Exam  GENERAL:  78 y.o.-year-old Caucasian patient lying in the bed with no acute distress.  EYES: Pupils equal, round, reactive to light and accommodation. No scleral icterus. Extraocular muscles intact.  HEENT: Head atraumatic, normocephalic. Oropharynx and nasopharynx clear.  NECK:  Supple, no jugular venous distention. No thyroid  enlargement, no tenderness.  LUNGS: Normal breath sounds bilaterally, no wheezing, rales,rhonchi or crepitation. No use of accessory muscles of respiration.  CARDIOVASCULAR: Irregular tachycardic rhythm S1, S2 normal. No murmurs, rubs, or gallops.  ABDOMEN: Soft, nondistended, nontender. Bowel sounds present. No organomegaly or mass.  EXTREMITIES: No pedal edema, cyanosis, or clubbing.  NEUROLOGIC: Cranial nerves II through XII are intact. Muscle strength 5/5 in all extremities. Sensation intact. Gait not checked.  PSYCHIATRIC: The patient is alert and oriented x 3.  Normal affect and good eye contact. SKIN: No obvious rash, lesion, or ulcer.   LABORATORY PANEL:   CBC Recent Labs  Lab 06/06/24 2259  WBC 10.7*  HGB 14.1  HCT 42.5  PLT  142*   ------------------------------------------------------------------------------------------------------------------  Chemistries  Recent Labs  Lab 06/06/24 2259  NA 133*  K 4.2  CL 98  CO2 20*  GLUCOSE 166*  BUN 46*  CREATININE 1.54*  CALCIUM  8.7*  MG 2.0  AST 83*  ALT 54*  ALKPHOS 86  BILITOT 1.3*   ------------------------------------------------------------------------------------------------------------------  Cardiac Enzymes No results for  input(s): TROPONINI in the last 168 hours. ------------------------------------------------------------------------------------------------------------------  RADIOLOGY:  CT Angio Chest PE W and/or Wo Contrast Result Date: 06/07/2024 CLINICAL DATA:  Pulmonary embolism (PE) suspected, low to intermediate prob, positive D-dimer. Suspected sepsis. EXAM: CT ANGIOGRAPHY CHEST WITH CONTRAST TECHNIQUE: Multidetector CT imaging of the chest was performed using the standard protocol during bolus administration of intravenous contrast. Multiplanar CT image reconstructions and MIPs were obtained to evaluate the vascular anatomy. RADIATION DOSE REDUCTION: This exam was performed according to the departmental dose-optimization program which includes automated exposure control, adjustment of the mA and/or kV according to patient size and/or use of iterative reconstruction technique. CONTRAST:  75mL OMNIPAQUE  IOHEXOL  350 MG/ML SOLN COMPARISON:  12/23/2014 FINDINGS: Cardiovascular: No filling defects in the pulmonary arteries to suggest pulmonary emboli. Heart is normal size. Aorta is normal caliber. Scattered coronary artery and aortic calcifications. Mediastinum/Nodes: No mediastinal, hilar, or axillary adenopathy. Trachea and esophagus are unremarkable. Multiple calcified nodules in the thyroid  are unchanged since prior study. Lungs/Pleura: No confluent airspace opacities or effusions. Upper Abdomen: No acute findings Musculoskeletal: Chest wall soft  tissues are unremarkable. No acute bony abnormality. Review of the MIP images confirms the above findings. IMPRESSION: No evidence of pulmonary embolus. Coronary artery disease. No acute cardiopulmonary disease. Aortic Atherosclerosis (ICD10-I70.0). Electronically Signed   By: Franky Crease M.D.   On: 06/07/2024 01:01   CT ABDOMEN PELVIS W CONTRAST Result Date: 06/07/2024 CLINICAL DATA:  Sepsis EXAM: CT ABDOMEN AND PELVIS WITH CONTRAST TECHNIQUE: Multidetector CT imaging of the abdomen and pelvis was performed using the standard protocol following bolus administration of intravenous contrast. RADIATION DOSE REDUCTION: This exam was performed according to the departmental dose-optimization program which includes automated exposure control, adjustment of the mA and/or kV according to patient size and/or use of iterative reconstruction technique. CONTRAST:  75mL OMNIPAQUE  IOHEXOL  350 MG/ML SOLN COMPARISON:  None Available. FINDINGS: Lower chest: No acute abnormality Hepatobiliary: No focal liver abnormality is seen. Status post cholecystectomy. No biliary dilatation. Pancreas: No focal abnormality or ductal dilatation. Spleen: No focal abnormality.  Normal size. Adrenals/Urinary Tract: Adrenal glands normal. No renal mass, stone or hydronephrosis. Nonspecific bilateral perinephric stranding. Urinary bladder decompressed, grossly unremarkable. Stomach/Bowel: Left colonic diverticulosis. No active diverticulitis. Stomach and small bowel decompressed. No bowel obstruction or inflammatory process. Vascular/Lymphatic: No evidence of aneurysm or adenopathy. Reproductive: 6 cm partially calcified left uterine fibroid. 2.8 cm cyst in the left ovary. Right ovary unremarkable. Other: No free fluid or free air. Musculoskeletal: No acute bony abnormality. IMPRESSION: No acute findings in the abdomen or pelvis. Nonspecific bilateral perinephric stranding which may be related to remote insult. No hydronephrosis or stones. Aortic  atherosclerosis. Left colonic diverticulosis. Electronically Signed   By: Franky Crease M.D.   On: 06/07/2024 00:59   CT HEAD WO CONTRAST ( ) Result Date: 06/06/2024 CLINICAL DATA:  Headache, new onset (Age >= 51y) generalized weakness and headache EXAM: CT HEAD WITHOUT CONTRAST TECHNIQUE: Contiguous axial images were obtained from the base of the skull through the vertex without intravenous contrast. RADIATION DOSE REDUCTION: This exam was performed according to the departmental dose-optimization program which includes automated exposure control, adjustment of the mA and/or kV according to patient size and/or use of iterative reconstruction technique. COMPARISON:  CT head 12/23/2014 FINDINGS: Brain: No evidence of large-territorial acute infarction. No parenchymal hemorrhage. No mass lesion. No extra-axial collection. No mass effect or midline shift. No hydrocephalus. Basilar cisterns are patent. Vascular: No hyperdense vessel. Atherosclerotic calcifications are present within the cavernous internal carotid  arteries. Skull: No acute fracture or focal lesion. Sinuses/Orbits: Paranasal sinuses and mastoid air cells are clear. Bilateral lens replacement. Otherwise the orbits are unremarkable. Other: None. IMPRESSION: No acute intracranial abnormality. Electronically Signed   By: Morgane  Naveau M.D.   On: 06/06/2024 23:35   DG Chest Port 1 View if patient is in a treatment room. Result Date: 06/06/2024 CLINICAL DATA:  Suspected sepsis, former smoker, asthma EXAM: PORTABLE CHEST 1 VIEW COMPARISON:  12/23/2014 FINDINGS: Stable cardiomediastinal silhouette. Aortic atherosclerotic calcification. Chronic interstitial coarsening. No focal consolidation, pleural effusion, or pneumothorax. No displaced rib fractures. IMPRESSION: Chronic interstitial coarsening.  No acute abnormality. Electronically Signed   By: Norman Gatlin M.D.   On: 06/06/2024 23:20      IMPRESSION AND PLAN:  Assessment and Plan: * Sepsis  due to gram-negative UTI [facility]) - The patient will be admitted to the progressive unit bed. - Will continue antibiotic therapy with IV Rocephin . - Will continue hydration with IV normal saline. - Will follow blood and urine cultures.    Atrial fibrillation with rapid ventricular response (HCC) - Will continue IV Cardizem  drip. - CHA2DS2-VASc score is 5. - Will obtain a 2D echo and cardiology consult. - Will place the patient on p.o. Eliquis . - I notified Dr. Florencio about  Controlled type 2 diabetes mellitus without complication, without long-term current use of insulin  (HCC) - The patient will be placed on supplemental coverage with NovoLog . - Will continue Farxiga  and hold off metformin .  Dyslipidemia - Will continue statin therapy.  Gout - Will continue allopurinol .     DVT prophylaxis: Lovenox.  Advanced Care Planning:  Code Status: full code.  Family Communication:  The plan of care was discussed in details with the patient (and family). I answered all questions. The patient agreed to proceed with the above mentioned plan. Further management will depend upon hospital course. Disposition Plan: Back to previous home environment Consults called: Cardiology All the records are reviewed and case discussed with ED provider.  Status is: Inpatient    At the time of the admission, it appears that the appropriate admission status for this patient is inpatient.  This is judged to be reasonable and necessary in order to provide the required intensity of service to ensure the patient's safety given the presenting symptoms, physical exam findings and initial radiographic and laboratory data in the context of comorbid conditions.  The patient requires inpatient status due to high intensity of service, high risk of further deterioration and high frequency of surveillance required.  I certify that at the time of admission, it is my clinical judgment that the patient will require  inpatient hospital care extending more than 2 midnights.                            Dispo: The patient is from: Home              Anticipated d/c is to: Home              Patient currently is not medically stable to d/c.              Difficult to place patient: No  Madison DELENA Peaches M.D on 06/07/2024 at 5:43 AM  [facility]   From 7 PM-7 AM, contact night-coverage [email or web address]  CC: Primary care physician; Sowles, Krichna, MD

## 2024-06-07 NOTE — Progress Notes (Signed)
*  PRELIMINARY RESULTS* Echocardiogram 2D Echocardiogram has been performed.  Amber Avery 06/07/2024, 9:36 AM

## 2024-06-07 NOTE — Assessment & Plan Note (Signed)
-   The patient will be admitted to the progressive unit bed. - Will continue antibiotic therapy with IV Rocephin . - Will continue hydration with IV normal saline. - Will follow blood and urine cultures.

## 2024-06-07 NOTE — ED Notes (Signed)
 Pt readjusted in bed, pillow placed under each hip and under right arm. Pt verbalized increased comfort. Denies any other needs. Call bell inreach.

## 2024-06-07 NOTE — Plan of Care (Signed)
 Not billable.    Patient in some discomfort.  Pain after urination.    Remains in A-fib with RVR     06/07/2024    6:00 PM 06/07/2024    5:45 PM 06/07/2024    4:00 PM  Vitals with BMI  Systolic 134 125 899  Diastolic 88 80 63  Pulse 89  97   Physical Exam  Constitutional: In no distress.  Cardiovascular: Irregularly irregular. No lower extremity edema  Pulmonary: Non labored breathing on [place] no wheezing or rales.  No lower extremity edema Abdominal: Soft. Obese, NT, ND  Musculoskeletal: Normal range of motion.     Neurological: Alert and oriented to person, place, and time. Non focal  Skin: Skin is warm and dry.    Sepsis 2/2 UTI  Continue with ceftriaxone .   Afib with RVR  IN setting of UTI and sepsis. Currently on diltaizem drip. Further management per cardiology

## 2024-06-07 NOTE — ED Notes (Signed)
 Patient repositioned in bed with pillows placed under both sides of pt backside.

## 2024-06-07 NOTE — Telephone Encounter (Unsigned)
 Copied from CRM #[id number]. Topic: General - Other >> Jun 07, 2024 12:00 PM Shardie S wrote: Reason for CRM: Patient calling to inform PCP that she is in the hospital. Patient states she has been sick since last Tuesday 8/12.

## 2024-06-08 ENCOUNTER — Encounter: Payer: Self-pay | Admitting: Family Medicine

## 2024-06-08 DIAGNOSIS — A4150 Gram-negative sepsis, unspecified: Secondary | ICD-10-CM | POA: Diagnosis not present

## 2024-06-08 DIAGNOSIS — I700 Atherosclerosis of aorta: Secondary | ICD-10-CM | POA: Insufficient documentation

## 2024-06-08 DIAGNOSIS — N390 Urinary tract infection, site not specified: Secondary | ICD-10-CM | POA: Diagnosis not present

## 2024-06-08 LAB — CBC
HCT: 37.8 % (ref 36.0–46.0)
Hemoglobin: 12.6 g/dL (ref 12.0–15.0)
MCH: 31.2 pg (ref 26.0–34.0)
MCHC: 33.3 g/dL (ref 30.0–36.0)
MCV: 93.6 fL (ref 80.0–100.0)
Platelets: 151 10*3/uL (ref 150–400)
RBC: 4.04 MIL/uL (ref 3.87–5.11)
RDW: 15.7 % — ABNORMAL HIGH (ref 11.5–15.5)
WBC: 12.6 10*3/uL — ABNORMAL HIGH (ref 4.0–10.5)
nRBC: 0 % (ref 0.0–0.2)

## 2024-06-08 LAB — BASIC METABOLIC PANEL WITH GFR
Anion gap: 9 (ref 5–15)
BUN: 43 mg/dL — ABNORMAL HIGH (ref 8–23)
CO2: 22 mmol/L (ref 22–32)
Calcium: 8.3 mg/dL — ABNORMAL LOW (ref 8.9–10.3)
Chloride: 101 mmol/L (ref 98–111)
Creatinine, Ser: 1.51 mg/dL — ABNORMAL HIGH (ref 0.44–1.00)
GFR, Estimated: 36 mL/min — ABNORMAL LOW
Glucose, Bld: 177 mg/dL — ABNORMAL HIGH (ref 70–99)
Potassium: 3.9 mmol/L (ref 3.5–5.1)
Sodium: 132 mmol/L — ABNORMAL LOW (ref 135–145)

## 2024-06-08 LAB — GLUCOSE, CAPILLARY
Glucose-Capillary: 142 mg/dL — ABNORMAL HIGH (ref 70–99)
Glucose-Capillary: 202 mg/dL — ABNORMAL HIGH (ref 70–99)
Glucose-Capillary: 171 mg/dL — ABNORMAL HIGH (ref 70–99)
Glucose-Capillary: 186 mg/dL — ABNORMAL HIGH (ref 70–99)

## 2024-06-08 LAB — MAGNESIUM: Magnesium: 2.7 mg/dL — ABNORMAL HIGH (ref 1.7–2.4)

## 2024-06-08 LAB — BRAIN NATRIURETIC PEPTIDE: B Natriuretic Peptide: 158.8 pg/mL — ABNORMAL HIGH (ref 0.0–100.0)

## 2024-06-08 MED ORDER — SODIUM CHLORIDE 0.9 % IV SOLN
2.0000 g | INTRAVENOUS | Status: DC
  Administered 2024-06-09: 2 g via INTRAVENOUS
  Filled 2024-06-08: qty 20

## 2024-06-08 MED ORDER — DICLOFENAC SODIUM 1 % EX GEL
4.0000 g | Freq: Four times a day (QID) | CUTANEOUS | Status: DC
  Administered 2024-06-08 – 2024-06-14 (×16): 4 g via TOPICAL
  Filled 2024-06-08: qty 100

## 2024-06-08 MED ORDER — ALLOPURINOL 100 MG PO TABS
50.0000 mg | ORAL_TABLET | Freq: Every day | ORAL | Status: DC
  Administered 2024-06-09 – 2024-06-12 (×4): 50 mg via ORAL
  Filled 2024-06-08 (×4): qty 1

## 2024-06-08 NOTE — Plan of Care (Signed)
  Problem: Coping: Goal: Ability to adjust to condition or change in health will improve Outcome: Progressing   Problem: Health Behavior/Discharge Planning: Goal: Ability to identify and utilize available resources and services will improve Outcome: Progressing   Problem: Health Behavior/Discharge Planning: Goal: Ability to manage health-related needs will improve Outcome: Progressing   Problem: Fluid Volume: Goal: Ability to maintain a balanced intake and output will improve Outcome: Progressing

## 2024-06-08 NOTE — Plan of Care (Signed)

## 2024-06-08 NOTE — Progress Notes (Signed)
 Patient ID: Amber Avery, female   DOB: [birth year], 78 y.o.   MRN: [id number] [facility]    SUBJECTIVE: States to be feeling reasonably well shortness of breath and congestion and URI has gotten better UTI symptoms are also improved still has some palpitations and tachycardia treated with metoprolol  and diltiazem  obstructive sleep apnea recommend CPAP weight loss   Vitals:   06/08/24 0000 06/08/24 0115 06/08/24 0135 06/08/24 0501  BP:    104/64  Pulse:  (!) 108  (!) 106  Resp: (!) 25  (!) 22 20  Temp:    97.8 F (36.6 C)  TempSrc:      SpO2:    96%  Height:         Intake/Output Summary (Last 24 hours) at 06/08/2024 0802 Last data filed at 06/08/2024 9375 Gross per 24 hour  Intake 467.95 ml  Output --  Net 467.95 ml      PHYSICAL EXAM  General: Well developed, well nourished, in no acute distress HEENT:  Normocephalic and atramatic Neck:  No JVD.  Lungs: Clear bilaterally to auscultation and percussion. Heart: HRRR . Normal S1 and S2 without gallops or murmurs.  Abdomen: Bowel sounds are positive, abdomen soft and non-tender  Msk:  Back normal, normal gait. Normal strength and tone for age. Extremities: No clubbing, cyanosis or edema.   Neuro: Alert and oriented X 3. Psych:  Good affect, responds appropriately   LABS: Basic Metabolic Panel: Recent Labs    06/06/24 2259 06/08/24 0541  NA 133* 132*  K 4.2 3.9  CL 98 101  CO2 20* 22  GLUCOSE 166* 177*  BUN 46* 43*  CREATININE 1.54* 1.51*  CALCIUM  8.7* 8.3*  MG 2.0 2.7*   Liver Function Tests: Recent Labs    06/06/24 2259  AST 83*  ALT 54*  ALKPHOS 86  BILITOT 1.3*  PROT 6.9  ALBUMIN 3.2*   No results for input(s): LIPASE, AMYLASE in the last 72 hours. CBC: Recent Labs    06/06/24 2259 06/08/24 0541  WBC 10.7* 12.6*  NEUTROABS 8.4*  --   HGB 14.1 12.6  HCT 42.5 37.8  MCV 93.8 93.6  PLT 142* 151   Cardiac Enzymes: No results for input(s): CKTOTAL, CKMB, CKMBINDEX,  TROPONINI in the last 72 hours. BNP: Invalid input(s): POCBNP D-Dimer: Recent Labs    06/06/24 2259  DDIMER 3.83*   Hemoglobin A1C: No results for input(s): HGBA1C in the last 72 hours. Fasting Lipid Panel: No results for input(s): CHOL, HDL, LDLCALC, TRIG, CHOLHDL, LDLDIRECT in the last 72 hours. Thyroid  Function Tests: Recent Labs    06/06/24 2259  TSH 1.951   Anemia Panel: No results for input(s): VITAMINB12, FOLATE, FERRITIN, TIBC, IRON , RETICCTPCT in the last 72 hours.  ECHOCARDIOGRAM COMPLETE Result Date: 06/07/2024    ECHOCARDIOGRAM REPORT   Patient Name:   Amber Avery Date of Exam: 06/07/2024 Medical Rec #:  [id number]       Height:       61.0 in Accession #:    [id number]      Weight:       247.8 lb Date of Birth:  03/03/1946       BSA:          2.069 m Patient Age:    77 years        BP:           137/103 mmHg Patient Gender: F  HR:           109 bpm. Exam Location:  [facility] Procedure: 2D Echo, Color Doppler and Cardiac Doppler (Both Spectral and Color            Flow Doppler were utilized during procedure). Indications:     Atrial Fibrillation I48.91  History:         Patient has no prior history of Echocardiogram examinations.                  Risk Factors:Hypertension, Diabetes and Sleep Apnea.  Sonographer:     Christopher Furnace Referring Phys:  [id number] JAN A MANSY Diagnosing Phys: Terrian Ridlon D Audre Cenci MD  Sonographer Comments: Technically challenging study due to limited acoustic windows. Image acquisition challenging due to patient body habitus. IMPRESSIONS  1. Left ventricular ejection fraction, by estimation, is 50 to 55%. The left ventricle has low normal function. The left ventricle has no regional wall motion abnormalities. Left ventricular diastolic function could not be evaluated.  2. Right ventricular systolic function is low normal. The right ventricular size is mildly enlarged.  3. The mitral valve is normal in structure. Trivial mitral  valve regurgitation.  4. The aortic valve is normal in structure. Aortic valve regurgitation is not visualized. FINDINGS  Left Ventricle: Left ventricular ejection fraction, by estimation, is 50 to 55%. The left ventricle has low normal function. The left ventricle has no regional wall motion abnormalities. Strain was performed and the global longitudinal strain is indeterminate. The left ventricular internal cavity size was normal in size. There is borderline left ventricular hypertrophy. Left ventricular diastolic function could not be evaluated. Right Ventricle: The right ventricular size is mildly enlarged. No increase in right ventricular wall thickness. Right ventricular systolic function is low normal. Left Atrium: Left atrial size was normal in size. Right Atrium: Right atrial size was normal in size. Pericardium: There is no evidence of pericardial effusion. Mitral Valve: The mitral valve is normal in structure. Trivial mitral valve regurgitation. Tricuspid Valve: The tricuspid valve is normal in structure. Tricuspid valve regurgitation is mild. Aortic Valve: The aortic valve is normal in structure. Aortic valve regurgitation is not visualized. Aortic valve mean gradient measures 7.0 mmHg. Aortic valve peak gradient measures 12.5 mmHg. Aortic valve area, by VTI measures 2.24 cm. Pulmonic Valve: The pulmonic valve was normal in structure. Pulmonic valve regurgitation is not visualized. Aorta: The ascending aorta was not well visualized. IAS/Shunts: No atrial level shunt detected by color flow Doppler. Additional Comments: 3D was performed not requiring image post processing on an independent workstation and was indeterminate.  LEFT VENTRICLE PLAX 2D LVIDd:         4.20 cm LVIDs:         3.10 cm LV PW:         1.20 cm LV IVS:        0.90 cm LVOT diam:     2.00 cm LV SV:         51 LV SV Index:   25 LVOT Area:     3.14 cm  RIGHT VENTRICLE RV Basal diam:  3.50 cm RV Mid diam:    2.90 cm RV S prime:     11.70  cm/s TAPSE (M-mode): 2.1 cm LEFT ATRIUM             Index        RIGHT ATRIUM           Index LA diam:  3.60 cm 1.74 cm/m   RA Area:     15.10 cm LA Vol (A2C):   62.0 ml 29.97 ml/m  RA Volume:   37.70 ml  18.22 ml/m LA Vol (A4C):   70.8 ml 34.22 ml/m LA Biplane Vol: 66.3 ml 32.05 ml/m  AORTIC VALVE AV Area (Vmax):    2.15 cm AV Area (Vmean):   2.20 cm AV Area (VTI):     2.24 cm AV Vmax:           177.00 cm/s AV Vmean:          122.000 cm/s AV VTI:            0.229 m AV Peak Grad:      12.5 mmHg AV Mean Grad:      7.0 mmHg LVOT Vmax:         121.00 cm/s LVOT Vmean:        85.500 cm/s LVOT VTI:          0.163 m LVOT/AV VTI ratio: 0.71  AORTA Ao Root diam: 2.70 cm MITRAL VALVE                TRICUSPID VALVE MV Area (PHT): 4.41 cm     TR Peak grad:   19.0 mmHg MV Decel Time: 172 msec     TR Vmax:        218.00 cm/s MV E velocity: 138.00 cm/s                             SHUNTS                             Systemic VTI:  0.16 m                             Systemic Diam: 2.00 cm Cara JONETTA Lovelace MD Electronically signed by Cara JONETTA Lovelace MD Signature Date/Time: 06/07/2024/5:10:06 PM    Final    CT Angio Chest PE W and/or Wo Contrast Result Date: 06/07/2024 CLINICAL DATA:  Pulmonary embolism (PE) suspected, low to intermediate prob, positive D-dimer. Suspected sepsis. EXAM: CT ANGIOGRAPHY CHEST WITH CONTRAST TECHNIQUE: Multidetector CT imaging of the chest was performed using the standard protocol during bolus administration of intravenous contrast. Multiplanar CT image reconstructions and MIPs were obtained to evaluate the vascular anatomy. RADIATION DOSE REDUCTION: This exam was performed according to the departmental dose-optimization program which includes automated exposure control, adjustment of the mA and/or kV according to patient size and/or use of iterative reconstruction technique. CONTRAST:  75mL OMNIPAQUE  IOHEXOL  350 MG/ML SOLN COMPARISON:  12/23/2014 FINDINGS: Cardiovascular: No filling  defects in the pulmonary arteries to suggest pulmonary emboli. Heart is normal size. Aorta is normal caliber. Scattered coronary artery and aortic calcifications. Mediastinum/Nodes: No mediastinal, hilar, or axillary adenopathy. Trachea and esophagus are unremarkable. Multiple calcified nodules in the thyroid  are unchanged since prior study. Lungs/Pleura: No confluent airspace opacities or effusions. Upper Abdomen: No acute findings Musculoskeletal: Chest wall soft tissues are unremarkable. No acute bony abnormality. Review of the MIP images confirms the above findings. IMPRESSION: No evidence of pulmonary embolus. Coronary artery disease. No acute cardiopulmonary disease. Aortic Atherosclerosis (ICD10-I70.0). Electronically Signed   By: Franky Crease M.D.   On: 06/07/2024 01:01   CT ABDOMEN PELVIS W CONTRAST Result Date: 06/07/2024 CLINICAL DATA:  Sepsis EXAM: CT ABDOMEN  AND PELVIS WITH CONTRAST TECHNIQUE: Multidetector CT imaging of the abdomen and pelvis was performed using the standard protocol following bolus administration of intravenous contrast. RADIATION DOSE REDUCTION: This exam was performed according to the departmental dose-optimization program which includes automated exposure control, adjustment of the mA and/or kV according to patient size and/or use of iterative reconstruction technique. CONTRAST:  75mL OMNIPAQUE  IOHEXOL  350 MG/ML SOLN COMPARISON:  None Available. FINDINGS: Lower chest: No acute abnormality Hepatobiliary: No focal liver abnormality is seen. Status post cholecystectomy. No biliary dilatation. Pancreas: No focal abnormality or ductal dilatation. Spleen: No focal abnormality.  Normal size. Adrenals/Urinary Tract: Adrenal glands normal. No renal mass, stone or hydronephrosis. Nonspecific bilateral perinephric stranding. Urinary bladder decompressed, grossly unremarkable. Stomach/Bowel: Left colonic diverticulosis. No active diverticulitis. Stomach and small bowel decompressed. No  bowel obstruction or inflammatory process. Vascular/Lymphatic: No evidence of aneurysm or adenopathy. Reproductive: 6 cm partially calcified left uterine fibroid. 2.8 cm cyst in the left ovary. Right ovary unremarkable. Other: No free fluid or free air. Musculoskeletal: No acute bony abnormality. IMPRESSION: No acute findings in the abdomen or pelvis. Nonspecific bilateral perinephric stranding which may be related to remote insult. No hydronephrosis or stones. Aortic atherosclerosis. Left colonic diverticulosis. Electronically Signed   By: Franky Crease M.D.   On: 06/07/2024 00:59   CT HEAD WO CONTRAST ( ) Result Date: 06/06/2024 CLINICAL DATA:  Headache, new onset (Age >= 51y) generalized weakness and headache EXAM: CT HEAD WITHOUT CONTRAST TECHNIQUE: Contiguous axial images were obtained from the base of the skull through the vertex without intravenous contrast. RADIATION DOSE REDUCTION: This exam was performed according to the departmental dose-optimization program which includes automated exposure control, adjustment of the mA and/or kV according to patient size and/or use of iterative reconstruction technique. COMPARISON:  CT head 12/23/2014 FINDINGS: Brain: No evidence of large-territorial acute infarction. No parenchymal hemorrhage. No mass lesion. No extra-axial collection. No mass effect or midline shift. No hydrocephalus. Basilar cisterns are patent. Vascular: No hyperdense vessel. Atherosclerotic calcifications are present within the cavernous internal carotid arteries. Skull: No acute fracture or focal lesion. Sinuses/Orbits: Paranasal sinuses and mastoid air cells are clear. Bilateral lens replacement. Otherwise the orbits are unremarkable. Other: None. IMPRESSION: No acute intracranial abnormality. Electronically Signed   By: Morgane  Naveau M.D.   On: 06/06/2024 23:35   DG Chest Port 1 View if patient is in a treatment room. Result Date: 06/06/2024 CLINICAL DATA:  Suspected sepsis, former  smoker, asthma EXAM: PORTABLE CHEST 1 VIEW COMPARISON:  12/23/2014 FINDINGS: Stable cardiomediastinal silhouette. Aortic atherosclerotic calcification. Chronic interstitial coarsening. No focal consolidation, pleural effusion, or pneumothorax. No displaced rib fractures. IMPRESSION: Chronic interstitial coarsening.  No acute abnormality. Electronically Signed   By: Norman Gatlin M.D.   On: 06/06/2024 23:20     Echo pending  TELEMETRY: Atrial fibrillation rate of 95:  ASSESSMENT AND PLAN:  Principal Problem:   Sepsis due to gram-negative UTI [facility]) Active Problems:   Dyslipidemia   Atrial fibrillation with rapid ventricular response (HCC)   Controlled type 2 diabetes mellitus without complication, without long-term current use of insulin  (HCC)   Gout    Plan Atrial fibrillation rate of 95 was rapid ventricular response on metoprolol  magnesium  diltiazem  Eliquis  Hypertension relatively controlled metoprolol  diltiazem  Hyperlipidemia agree with Lipitor therapy for lipid management Obesity recommend weight loss exercise portion control Sepsis related to UTI continue antibiotic therapy as necessary Sleep study CPAP if indicated for evaluation and treatment of obstructive sleep apnea Diabetes continue current management medically  Cara JONETTA Lovelace, MD, 06/08/2024 8:02 AM

## 2024-06-08 NOTE — Evaluation (Signed)
 Physical Therapy Evaluation Patient Details Name: PAYAL STANFORTH MRN: [id number] DOB: Oct 18, 1946 Today's Date: 06/08/2024  History of Present Illness  Pt is a 78 y/o F presenting to ED with c/o weakness, HA, fever, chills. Workup for sepsis 2/2 UTI and new onset afib w/ RVR. PMH significant for asthma, T2DM, gout, HTN, dyslipidemia, OSA on CPAP.  Clinical Impression  Pt A&Ox4 and agreeable to PT evaluation. Pt denied pain throughout session. At baseline, pt reports being IND with household ambulation with 3WW and IND with IADLs. Pt reported 2 recent falls getting out of bed to go to bathroom at night. Pt completed bed mobility with minA, able to initiate supine > sit with Hob elevated, bed rail assist, and increased time and effort. STS from EOB and <> recliner with RW and CGA. Step-pivot transfer to recliner with CGA, pt noticeably winded after transfer but with prolonged seated rest agreed to short distance amb of ~66ft forward/backward with RW and CGA. Pt's HR remained within range of high 70s to low 90s, highest HR recorded after transfer > recliner of 94bpm; SpO2 remained within 93-94% throughout session with pt on room air. Pt is currently displaying deficits in functional mobility/activity tolerance and would benefit from skilled PT intervention to address listed deficits and allow for safe return to PLOF.         If plan is discharge home, recommend the following: A little help with walking and/or transfers;A little help with bathing/dressing/bathroom;Assistance with cooking/housework;Assist for transportation;Help with stairs or ramp for entrance   Can travel by private vehicle        Equipment Recommendations Rolling walker (2 wheels)  Recommendations for Other Services  OT consult    Functional Status Assessment Patient has had a recent decline in their functional status and demonstrates the ability to make significant improvements in function in a reasonable and predictable amount  of time.     Precautions / Restrictions Precautions Precautions: Fall Restrictions Weight Bearing Restrictions Per Provider Order: No      Mobility  Bed Mobility Overal bed mobility: Needs Assistance Bed Mobility: Supine to Sit     Supine to sit: Min assist, HOB elevated, Used rails     General bed mobility comments: pt able to initiate supine > sit with increased time and effort, ultimately required minA to achieve sitting    Transfers Overall transfer level: Needs assistance Equipment used: Rolling walker (2 wheels) Transfers: Sit to/from Stand, Bed to chair/wheelchair/BSC Sit to Stand: Contact guard assist   Step pivot transfers: Contact guard assist       General transfer comment: STS from EOB with RW, CGA, increased time and effort. Cues for hand placement on RW/mattress. Step-pivot to recliner with CGA, minimal foot clearance bilaterally    Ambulation/Gait Ambulation/Gait assistance: Contact guard assist Gait Distance (Feet): 10 Feet (28ft forward, 93ft backward) Assistive device: Rolling walker (2 wheels) Gait Pattern/deviations: Step-through pattern, Shuffle, Trunk flexed, Wide base of support Gait velocity: decreased     General Gait Details: decreased cadence, minimal foot clearance bilaterally  Stairs            Wheelchair Mobility     Tilt Bed    Modified Rankin (Stroke Patients Only)       Balance Overall balance assessment: Needs assistance Sitting-balance support: Feet supported Sitting balance-Leahy Scale: Good     Standing balance support: Bilateral upper extremity supported, Reliant on assistive device for balance Standing balance-Leahy Scale: Fair Standing balance comment: no LOB, heavy UE use  Pertinent Vitals/Pain Pain Assessment Pain Assessment: No/denies pain    Home Living Family/patient expects to be discharged to:: Private residence Living Arrangements: Alone   Type of Home:  House Home Access: Ramped entrance       Home Layout: One level Home Equipment: Shower seat;Grab bars - toilet;Grab bars - tub/shower;Other (comment) (3WW)      Prior Function Prior Level of Function : Independent/Modified Independent             Mobility Comments: Pt reports being a household amb with 3WW prior to admission. Cites 2 falls recently where she has fallen out of bed trying to get up too fast to go to bathroom ADLs Comments: Pt reports being IND with IADLs, but states that she is in the process of getting help with cleaning her house through insurance     Extremity/Trunk Assessment   Upper Extremity Assessment Upper Extremity Assessment: Generalized weakness    Lower Extremity Assessment Lower Extremity Assessment: Generalized weakness       Communication   Communication Communication: No apparent difficulties    Cognition Arousal: Alert Behavior During Therapy: WFL for tasks assessed/performed   PT - Cognitive impairments: No apparent impairments                         Following commands: Impaired Following commands impaired: Follows one step commands with increased time     Cueing Cueing Techniques: Verbal cues, Visual cues     General Comments      Exercises Other Exercises Other Exercises: Vitals monitored: HR varied between high 70s and low 90s throughout, highest HR after transfers. SpO2 remained between 93-94% throughout with pt on RA   Assessment/Plan    PT Assessment Patient needs continued PT services  PT Problem List Decreased strength;Decreased activity tolerance;Decreased balance;Decreased mobility;Decreased knowledge of use of DME;Cardiopulmonary status limiting activity       PT Treatment Interventions DME instruction;Gait training;Functional mobility training;Therapeutic activities;Therapeutic exercise;Balance training;Neuromuscular re-education;Patient/family education    PT Goals (Current goals can be found in  the Care Plan section)  Acute Rehab PT Goals Patient Stated Goal: to get better PT Goal Formulation: With patient Time For Goal Achievement: 06/22/24 Potential to Achieve Goals: Fair    Frequency Min 2X/week     Co-evaluation               AM-PAC PT 6 Clicks Mobility  Outcome Measure Help needed turning from your back to your side while in a flat bed without using bedrails?: A Little Help needed moving from lying on your back to sitting on the side of a flat bed without using bedrails?: A Little Help needed moving to and from a bed to a chair (including a wheelchair)?: A Little Help needed standing up from a chair using your arms (e.g., wheelchair or bedside chair)?: A Little Help needed to walk in hospital room?: A Little Help needed climbing 3-5 steps with a railing? : A Lot 6 Click Score: 17    End of Session Equipment Utilized During Treatment: Gait belt Activity Tolerance: Patient limited by fatigue Patient left: in chair;with call bell/phone within reach;with chair alarm set;with family/visitor present Nurse Communication: Mobility status PT Visit Diagnosis: Other abnormalities of gait and mobility (R26.89);Muscle weakness (generalized) (M62.81);History of falling (Z91.81);Difficulty in walking, not elsewhere classified (R26.2)    Time: 8486-8454 PT Time Calculation (min) (ACUTE ONLY): 32 min   Charges:   PT Evaluation $PT Eval Low Complexity: 1 Low PT  Treatments $Therapeutic Activity: 23-37 mins PT General Charges $$ ACUTE PT VISIT: 1 Visit         Collin Rengel, SPT

## 2024-06-08 NOTE — Progress Notes (Addendum)
 Progress Note   Patient: Amber Avery FMW:[id number] DOB: Mar 16, 1946 DOA: 06/06/2024     1 DOS: the patient was seen and examined on 06/08/2024   Brief hospital course: Amber Avery is a 78 y.o. obese female with medical history significant for asthma, CKD3b, type diabetes mellitus, gout, hypertension, dyslipidemia and OSA, who presented to the emergency room with acute onset of malaise and feeling sick since Tuesday with diaphoresis.  She had chills on Saturday.  She admits to urinary frequency and urgency without dysuria or hematuria or flank pain.  No chest pain or palpitations.  No dyspnea or cough or wheezing.  No nausea or vomiting or abdominal pain.  She denies any history of atrial fibrillation.    Assessment and Plan: * Sepsis due to gram-negative UTI (HCC) Sepsis physiology present on arrival, now resolved Urine cultures growing Klebsiella pneumoniae, follow-up sensitivities - Will continue antibiotic therapy with IV Rocephin .   Atrial fibrillation with rapid ventricular response RVR resolved  In the setting of above  Rate controlled,  - CHA2DS2-VASc score is 5. TTE with EF 50 to 55%, RV systolic function low normal, RV size mildly enlarged, no significant valvular abnormality Per cardiology  Metop tartrate 25mg  q6h, dilt infusion, Eliquis    Controlled type 2 diabetes mellitus without complication, without long-term current use of insulin  (HCC) - The patient will be placed on supplemental coverage with NovoLog . - Will continue Farxiga  and hold off metformin .  Dyslipidemia - Will continue statin therapy.  Gout - Will continue allopurinol  at reduced dose given renal dysfunction   CKD 3b BL serum creatinine appears to be ~1.32     Latest Ref Rng & Units 06/08/2024    5:41 AM 06/06/2024   10:59 PM 05/05/2024    2:36 PM  BMP  Glucose 70 - 99 mg/dL 822  833  877   BUN 8 - 23 mg/dL 43  46  40   Creatinine 0.44 - 1.00 mg/dL 8.48  8.45  8.67    8.56   BUN/Creat Ratio  6 - 22 (calc)   30   Sodium 135 - 145 mmol/L 132  133  140   Potassium 3.5 - 5.1 mmol/L 3.9  4.2  4.7   Chloride 98 - 111 mmol/L 101  98  103   CO2 22 - 32 mmol/L 22  20  25    Calcium  8.9 - 10.3 mg/dL 8.3  8.7  9.9   Avoid nephrotoxic agents  Will continue to monitor    BMI:  47.63 kg/m Obesity class III  Complicates care and overall prognosis.      Subjective: No burning or pain with urination.   Physical Exam: Vitals:   06/08/24 0000 06/08/24 0115 06/08/24 0135 06/08/24 0501  BP:    104/64  Pulse:  (!) 108  (!) 106  Resp: (!) 25  (!) 22 20  Temp:    97.8 F (36.6 C)  TempSrc:      SpO2:    96%  Height:       Physical Exam  Constitutional: In no distress.  Cardiovascular: Normal rate, regular rhythm. 1+ bilateral lower extremity edema  Pulmonary: Non labored breathing on room air, no wheezing. Faint crackles in the bases.    Abdominal: Soft. Normal bowel sounds. Non distended and non tender Musculoskeletal: Normal range of motion.     Neurological: Alert and oriented to person, place, and time. Non focal  Skin: Skin is warm and dry.   Data Reviewed:  Latest Ref Rng & Units 06/08/2024    5:41 AM 06/06/2024   10:59 PM 05/05/2024    2:36 PM  CBC  WBC 4.0 - 10.5 K/uL 12.6  10.7  6.7   Hemoglobin 12.0 - 15.0 g/dL 87.3  85.8  85.3   Hematocrit 36.0 - 46.0 % 37.8  42.5  45.0   Platelets 150 - 400 K/uL 151  142  215       Latest Ref Rng & Units 06/08/2024    5:41 AM 06/06/2024   10:59 PM 05/05/2024    2:36 PM  BMP  Glucose 70 - 99 mg/dL 822  833  877   BUN 8 - 23 mg/dL 43  46  40   Creatinine 0.44 - 1.00 mg/dL 8.48  8.45  8.67    8.56   BUN/Creat Ratio 6 - 22 (calc)   30   Sodium 135 - 145 mmol/L 132  133  140   Potassium 3.5 - 5.1 mmol/L 3.9  4.2  4.7   Chloride 98 - 111 mmol/L 101  98  103   CO2 22 - 32 mmol/L 22  20  25    Calcium  8.9 - 10.3 mg/dL 8.3  8.7  9.9       Family Communication: None at bedside. Patient noted understanding of plan.    Disposition: Status is: Inpatient Remains inpatient appropriate because: remains in afib on diltiazem  infusion, treatment for infection.   Planned Discharge Destination: Pending     Time spent: 35 minutes  Author: Alban Pepper, MD 06/08/2024 9:53 AM  For on call review [email or web address].

## 2024-06-09 DIAGNOSIS — N390 Urinary tract infection, site not specified: Secondary | ICD-10-CM | POA: Diagnosis not present

## 2024-06-09 DIAGNOSIS — A4150 Gram-negative sepsis, unspecified: Secondary | ICD-10-CM | POA: Diagnosis not present

## 2024-06-09 LAB — BASIC METABOLIC PANEL WITH GFR
Anion gap: 9 (ref 5–15)
BUN: 55 mg/dL — ABNORMAL HIGH (ref 8–23)
CO2: 22 mmol/L (ref 22–32)
Calcium: 8.5 mg/dL — ABNORMAL LOW (ref 8.9–10.3)
Chloride: 104 mmol/L (ref 98–111)
Creatinine, Ser: 1.51 mg/dL — ABNORMAL HIGH (ref 0.44–1.00)
GFR, Estimated: 36 mL/min — ABNORMAL LOW
Glucose, Bld: 162 mg/dL — ABNORMAL HIGH (ref 70–99)
Potassium: 3.8 mmol/L (ref 3.5–5.1)
Sodium: 135 mmol/L (ref 135–145)

## 2024-06-09 LAB — GLUCOSE, CAPILLARY
Glucose-Capillary: 212 mg/dL — ABNORMAL HIGH (ref 70–99)
Glucose-Capillary: 150 mg/dL — ABNORMAL HIGH (ref 70–99)
Glucose-Capillary: 196 mg/dL — ABNORMAL HIGH (ref 70–99)
Glucose-Capillary: 198 mg/dL — ABNORMAL HIGH (ref 70–99)

## 2024-06-09 LAB — URINE CULTURE: Culture: >=100000 — AB

## 2024-06-09 LAB — MAGNESIUM: Magnesium: 2.6 mg/dL — ABNORMAL HIGH (ref 1.7–2.4)

## 2024-06-09 MED ORDER — BACITRACIN-NEOMYCIN-POLYMYXIN OINTMENT TUBE
TOPICAL_OINTMENT | Freq: Every day | CUTANEOUS | Status: DC
  Filled 2024-06-09: qty 14.17

## 2024-06-09 MED ORDER — LORATADINE 10 MG PO TABS
10.0000 mg | ORAL_TABLET | Freq: Every day | ORAL | Status: DC
  Administered 2024-06-09 – 2024-06-16 (×8): 10 mg via ORAL
  Filled 2024-06-09 (×9): qty 1

## 2024-06-09 MED ORDER — WARFARIN - PHARMACIST DOSING INPATIENT
Freq: Every day | Status: DC
  Administered 2024-06-15: 1

## 2024-06-09 MED ORDER — INSULIN GLARGINE 100 UNIT/ML ~~LOC~~ SOLN
5.0000 [IU] | Freq: Every day | SUBCUTANEOUS | Status: DC
  Administered 2024-06-09: 5 [IU] via SUBCUTANEOUS
  Filled 2024-06-09: qty 0.05

## 2024-06-09 MED ORDER — CEFADROXIL 500 MG PO CAPS
500.0000 mg | ORAL_CAPSULE | Freq: Two times a day (BID) | ORAL | Status: AC
  Administered 2024-06-10 – 2024-06-11 (×4): 500 mg via ORAL
  Filled 2024-06-09 (×4): qty 1

## 2024-06-09 MED ORDER — WARFARIN SODIUM 7.5 MG PO TABS
7.5000 mg | ORAL_TABLET | Freq: Once | ORAL | Status: AC
  Administered 2024-06-09: 7.5 mg via ORAL
  Filled 2024-06-09: qty 1

## 2024-06-09 NOTE — Progress Notes (Signed)
 PHARMACY - ANTICOAGULATION CONSULT NOTE  Pharmacy Consult for warfarin Indication: atrial fibrillation  Allergies  Allergen Reactions   Codeine Hives and Nausea And Vomiting    Patient Measurements: Height: 5' 1 (154.9 cm) Weight: 114.4 kg (252 lb 1.6 oz) IBW/kg (Calculated) : 47.8  Vital Signs: Temp: 97.9 F (36.6 C) (08/22 1100) Temp Source: Oral (08/22 0900) BP: 119/66 (08/22 1100) Pulse Rate: 66 (08/22 1100)  Labs: Recent Labs    06/06/24 2259 06/07/24 0216 06/08/24 0541 06/09/24 0606  HGB 14.1  --  12.6  --   HCT 42.5  --  37.8  --   PLT 142*  --  151  --   LABPROT 14.9  --   --   --   INR 1.1  --   --   --   CREATININE 1.54*  --  1.51* 1.51*  TROPONINIHS 27* 26*  --   --     Estimated Creatinine Clearance: 37.2 mL/min (A) (by C-G formula based on SCr of 1.51 mg/dL (H)).   Medical History: Past Medical History:  Diagnosis Date   Allergy    Arthritis    Asthma    Diabetes mellitus without complication (HCC)    Gout    Hyperlipidemia    Hypertension    Iron  deficiency anemia 04/20/2018   Oxygen  deficiency    PONV (postoperative nausea and vomiting)    after hemorroid surgery   Shortness of breath dyspnea    Sleep apnea     Medications:  Scheduled:   allopurinol   50 mg Oral Daily   apixaban   5 mg Oral BID   atorvastatin   20 mg Oral Daily   [START ON 06/10/2024] cefadroxil   500 mg Oral BID   cholecalciferol   1,000 Units Oral Daily   cyanocobalamin   500 mcg Oral Daily   [START ON 06/14/2024] dapagliflozin  propanediol  10 mg Oral Q breakfast   diclofenac  Sodium  4 g Topical QID   insulin  aspart  0-15 Units Subcutaneous TID WC   insulin  aspart  0-5 Units Subcutaneous QHS   metoprolol  tartrate  25 mg Oral Q6H   neomycin -bacitracin -polymyxin   Topical Daily   potassium chloride  SA  20 mEq Oral Daily   Infusions:   diltiazem  (CARDIZEM ) infusion 2.5 mg/hr (06/08/24 1712)   PRN: acetaminophen  **OR** acetaminophen , albuterol , magnesium  hydroxide,  ondansetron  **OR** ondansetron  (ZOFRAN ) IV, traMADol , traZODone   Assessment: 78 y.o. female with medical history significant for asthma, CKD3b, type diabetes mellitus, gout, hypertension, dyslipidemia and OSA, who presented to the emergency room with sepsis due to gram-negative UTI. She was started on Eliquis  but is concerned about the affordability   Goal of Therapy:  INR 2-3 Monitor platelets by anticoagulation protocol: Yes   Plan:  ---stop apixaban  ---start warfarin 7.5 mg po x 1 timed at next scheduled dose of apixaban   ---daily INR to guide therapy ---CBC at least once weekly while inpatient  Amber Avery 06/09/2024,4:21 PM

## 2024-06-09 NOTE — Progress Notes (Signed)
 Patient ID: Dagoberto DELENA Brighter, female   DOB: March 25, 1946, 78 y.o.   MRN: [id number] [facility]    SUBJECTIVE: Complains of some shortness of breath somewhat improved does not notice any significant tachycardia or heart racing concerned about affordability of Eliquis    Vitals:   06/09/24 0015 06/09/24 0351 06/09/24 0900 06/09/24 1100  BP: 113/76 113/87 120/74 119/66  Pulse: 78 93 75 66  Resp: 18 18 20 14   Temp: 99.2 F (37.3 C) 98.1 F (36.7 C) 97.7 F (36.5 C) 97.9 F (36.6 C)  TempSrc:   Oral   SpO2: 96% 93% 96% 95%  Weight:      Height:         Intake/Output Summary (Last 24 hours) at 06/09/2024 1558 Last data filed at 06/09/2024 1300 Gross per 24 hour  Intake 174.2 ml  Output --  Net 174.2 ml      PHYSICAL EXAM  General: Well developed, well nourished, in no acute distress HEENT:  Normocephalic and atramatic Neck:  No JVD.  Lungs: Clear bilaterally to auscultation and percussion. Heart: Irregular irregular. Normal S1 and S2 without gallops or murmurs.  Abdomen: Bowel sounds are positive, abdomen soft and non-tender  Msk:  Back normal, normal gait. Normal strength and tone for age. Extremities: No clubbing, cyanosis or edema.   Neuro: Alert and oriented X 3. Psych:  Good affect, responds appropriately   LABS: Basic Metabolic Panel: Recent Labs    06/08/24 0541 06/09/24 0606  NA 132* 135  K 3.9 3.8  CL 101 104  CO2 22 22  GLUCOSE 177* 162*  BUN 43* 55*  CREATININE 1.51* 1.51*  CALCIUM  8.3* 8.5*  MG 2.7* 2.6*   Liver Function Tests: Recent Labs    06/06/24 2259  AST 83*  ALT 54*  ALKPHOS 86  BILITOT 1.3*  PROT 6.9  ALBUMIN 3.2*   No results for input(s): LIPASE, AMYLASE in the last 72 hours. CBC: Recent Labs    06/06/24 2259 06/08/24 0541  WBC 10.7* 12.6*  NEUTROABS 8.4*  --   HGB 14.1 12.6  HCT 42.5 37.8  MCV 93.8 93.6  PLT 142* 151   Cardiac Enzymes: No results for input(s): CKTOTAL, CKMB, CKMBINDEX, TROPONINI in the  last 72 hours. BNP: Invalid input(s): POCBNP D-Dimer: Recent Labs    06/06/24 2259  DDIMER 3.83*   Hemoglobin A1C: No results for input(s): HGBA1C in the last 72 hours. Fasting Lipid Panel: No results for input(s): CHOL, HDL, LDLCALC, TRIG, CHOLHDL, LDLDIRECT in the last 72 hours. Thyroid  Function Tests: Recent Labs    06/06/24 2259  TSH 1.951   Anemia Panel: No results for input(s): VITAMINB12, FOLATE, FERRITIN, TIBC, IRON , RETICCTPCT in the last 72 hours.  No results found.   Echo EF around 50 to 55%  TELEMETRY: Atrial fibrillation rate of 85:  ASSESSMENT AND PLAN:  Principal Problem:   Sepsis due to gram-negative UTI (HCC) Active Problems:   Dyslipidemia   Atrial fibrillation with rapid ventricular response (HCC)   Controlled type 2 diabetes mellitus without complication, without long-term current use of insulin  (HCC)   Gout    Plan Patient presented with rapid atrial fibrillation and possible sepsis treated with rate control anticoagulation Will consider Coumadin  patient unable to afford Eliquis  and rate control with metoprolol  and Cardizem  Obstructive sleep apnea management with CPAP Continue to manage diabetes A1c goal less than 7 Renal insufficiency would recommend continued therapy consider follow-up with nephrology Hypertension management with metoprolol  possibly Cardizem  Recommend weight loss exercise  portion control Consult pharmacy to help transition from Eliquis  to warfarin for A-fib management   Cara JONETTA Lovelace, MD, 06/09/2024 3:58 PM

## 2024-06-09 NOTE — Progress Notes (Addendum)
 Occupational Therapy Treatment Patient Details Name: Amber Avery MRN: [id number] DOB: 10/18/1946 Today's Date: 06/09/2024   History of present illness Pt is a 78 y/o F presenting to ED with c/o weakness, HA, fever, chills. Workup for sepsis 2/2 UTI and new onset afib w/ RVR. PMH significant for asthma, T2DM, gout, HTN, dyslipidemia, OSA on CPAP.   OT comments  Amber Avery was seen for OT evaluation this date. Prior to hospital admission, pt was MOD I using 3WW for household mobility. Pt lives alone. Pt presents to acute OT demonstrating impaired ADL performance and functional mobility 2/2 decreased activity tolerance and functional strength deficits. Pt currently requires CGA+ RW for toielt t/f ~10 ft. MAX A pericare standing. MIN A don underwear, assist threading over BLE. Educated on OT role and d/c recs. Pt would benefit from skilled OT to address noted impairments and functional limitations (see below for any additional details). Upon hospital discharge, recommend OT follow up at [facility] however if pt able to have daily support for I/ADLs pt may d/c home safely with HHOT.       If plan is discharge home, recommend the following:  A little help with walking and/or transfers;A little help with bathing/dressing/bathroom;Help with stairs or ramp for entrance   Equipment Recommendations  BSC/3in1    Recommendations for Other Services      Precautions / Restrictions Precautions Precautions: Fall Restrictions Weight Bearing Restrictions Per Provider Order: No       Mobility Bed Mobility               General bed mobility comments: not tested    Transfers Overall transfer level: Needs assistance Equipment used: Rolling walker (2 wheels) Transfers: Sit to/from Stand Sit to Stand: Contact guard assist                 Balance Overall balance assessment: Needs assistance Sitting-balance support: Feet supported Sitting balance-Leahy Scale: Normal     Standing balance  support: No upper extremity supported, During functional activity Standing balance-Leahy Scale: Fair                             ADL either performed or assessed with clinical judgement   ADL Overall ADL's : Needs assistance/impaired                                       General ADL Comments: MAX A pericare standing. MIN A don underwear, assist threading over BLE.    Extremity/Trunk Assessment Upper Extremity Assessment Upper Extremity Assessment: Generalized weakness   Lower Extremity Assessment Lower Extremity Assessment: Generalized weakness        Vision       Perception     Praxis     Communication Communication Communication: No apparent difficulties   Cognition Arousal: Alert Behavior During Therapy: WFL for tasks assessed/performed Cognition: No apparent impairments                               Following commands: Impaired Following commands impaired: Follows one step commands with increased time      Cueing   Cueing Techniques: Verbal cues, Visual cues  Exercises      Shoulder Instructions       General Comments      Pertinent Vitals/ Pain  Pain Assessment Pain Assessment: No/denies pain  Home Living Family/patient expects to be discharged to:: Private residence Living Arrangements: Alone   Type of Home: House Home Access: Ramped entrance     Home Layout: One level     Bathroom Shower/Tub: Tub/shower unit         Home Equipment: Shower seat;Grab bars - toilet;Grab bars - tub/shower;Other (comment);Electric scooter   Additional Comments: 3WW, hover round      Prior Functioning/Environment              Frequency  Min 3X/week        Progress Toward Goals  OT Goals(current goals can now be found in the care plan section)     Acute Rehab OT Goals Patient Stated Goal: to go home OT Goal Formulation: With patient Time For Goal Achievement: 06/23/24 Potential to Achieve  Goals: Good ADL Goals Pt Will Perform Grooming: with modified independence;standing Pt Will Perform Lower Body Dressing: with modified independence;sit to/from stand Pt Will Transfer to Toilet: with modified independence;ambulating;regular height toilet  Plan      Co-evaluation                 AM-PAC OT 6 Clicks Daily Activity     Outcome Measure   Help from another person eating meals?: None Help from another person taking care of personal grooming?: A Little Help from another person toileting, which includes using toliet, bedpan, or urinal?: A Lot Help from another person bathing (including washing, rinsing, drying)?: A Lot Help from another person to put on and taking off regular upper body clothing?: None Help from another person to put on and taking off regular lower body clothing?: A Little 6 Click Score: 18    End of Session Equipment Utilized During Treatment: Rolling walker (2 wheels)  OT Visit Diagnosis: Other abnormalities of gait and mobility (R26.89);Muscle weakness (generalized) (M62.81)   Activity Tolerance Patient tolerated treatment well   Patient Left in bed;with call bell/phone within reach;with family/visitor present   Nurse Communication Mobility status        Time: 9073-9041 OT Time Calculation (min): 32 min  Charges: OT General Charges $OT Visit: 1 Visit OT Evaluation $OT Eval Moderate Complexity: 1 Mod OT Treatments $Self Care/Home Management : 23-37 mins  Elston Slot, M.S. OTR/L  06/09/24, 10:26 AM  ascom [phone]

## 2024-06-09 NOTE — Progress Notes (Signed)
 Progress Note   Patient: Amber Avery FMW:[id number] DOB: 1946/05/31 DOA: 06/06/2024     2 DOS: the patient was seen and examined on 06/09/2024   Brief hospital course: Amber Avery is a 78 y.o. obese female with medical history significant for asthma, CKD3b, type diabetes mellitus, gout, hypertension, dyslipidemia and OSA, who presented to the emergency room with acute onset of malaise and feeling sick since Tuesday with diaphoresis.  She had chills on Saturday.  She admits to urinary frequency and urgency without dysuria or hematuria or flank pain.  No chest pain or palpitations.  No dyspnea or cough or wheezing.  No nausea or vomiting or abdominal pain.  She denies any history of atrial fibrillation.    Assessment and Plan: * Sepsis due to gram-negative UTI (HCC) Sepsis physiology present on arrival, now resolved Urine cultures growing Klebsiella pneumoniae, Status post 3 doses of ceftriaxone  -Continue cefadroxil  for 2 more days of antibiotics   Atrial fibrillation with rapid ventricular response RVR resolved  In the setting of above  Rate controlled,  - CHA2DS2-VASc score is 5. TTE with EF 50 to 55%, RV systolic function low normal, RV size mildly enlarged, no significant valvular abnormality Per cardiology  Metop tartrate 25mg  q6h, dilt infusion, Eliquis    Controlled type 2 diabetes mellitus without complication, without long-term current use of insulin  (HCC) A1c 7.3  - SSI CBG (last 3)  Recent Labs    06/09/24 0854 06/09/24 1238 06/09/24 1619  GLUCAP 212* 150* 196*    - Will continue Farxiga  and hold off metformin .  Dyslipidemia - Will continue statin therapy.  Gout - Will continue allopurinol  at reduced dose given renal dysfunction   CKD 3b BL serum creatinine appears to be ~1.32     Latest Ref Rng & Units 06/09/2024    6:06 AM 06/08/2024    5:41 AM 06/06/2024   10:59 PM  BMP  Glucose 70 - 99 mg/dL 837  822  833   BUN 8 - 23 mg/dL 55  43  46    Creatinine 0.44 - 1.00 mg/dL 8.48  8.48  8.45   Sodium 135 - 145 mmol/L 135  132  133   Potassium 3.5 - 5.1 mmol/L 3.8  3.9  4.2   Chloride 98 - 111 mmol/L 104  101  98   CO2 22 - 32 mmol/L 22  22  20    Calcium  8.9 - 10.3 mg/dL 8.5  8.3  8.7   Avoid nephrotoxic agents  Will continue to monitor  Nephrology referral sent     BMI:  47.63 kg/m Obesity class III  Complicates care and overall prognosis.      Subjective: Not sleeping well overnight. Sob with activity. This has been going on for many years. No chest pain.   Physical Exam: Vitals:   06/09/24 0015 06/09/24 0351 06/09/24 0900 06/09/24 1100  BP: 113/76 113/87 120/74 119/66  Pulse: 78 93 75 66  Resp: 18 18 20 14   Temp: 99.2 F (37.3 C) 98.1 F (36.7 C) 97.7 F (36.5 C) 97.9 F (36.6 C)  TempSrc:   Oral   SpO2: 96% 93% 96% 95%  Weight:      Height:       Physical Exam  Constitutional: In no distress.  Cardiovascular: irregularly irregular. No lower extremity edema  Pulmonary: Non labored breathing on room air, no wheezing or rales.   Abdominal: Soft. Obese. Non distended and non tender Musculoskeletal: Normal range of motion.  Neurological: Alert and oriented to person, place, and time. Non focal  Skin: Skin is warm and dry.   Data Reviewed:     Latest Ref Rng & Units 06/08/2024    5:41 AM 06/06/2024   10:59 PM 05/05/2024    2:36 PM  CBC  WBC 4.0 - 10.5 K/uL 12.6  10.7  6.7   Hemoglobin 12.0 - 15.0 g/dL 87.3  85.8  85.3   Hematocrit 36.0 - 46.0 % 37.8  42.5  45.0   Platelets 150 - 400 K/uL 151  142  215       Latest Ref Rng & Units 06/09/2024    6:06 AM 06/08/2024    5:41 AM 06/06/2024   10:59 PM  BMP  Glucose 70 - 99 mg/dL 837  822  833   BUN 8 - 23 mg/dL 55  43  46   Creatinine 0.44 - 1.00 mg/dL 8.48  8.48  8.45   Sodium 135 - 145 mmol/L 135  132  133   Potassium 3.5 - 5.1 mmol/L 3.8  3.9  4.2   Chloride 98 - 111 mmol/L 104  101  98   CO2 22 - 32 mmol/L 22  22  20    Calcium  8.9 - 10.3 mg/dL  8.5  8.3  8.7       Family Communication: None at bedside. Patient noted understanding of plan.   Disposition: Status is: Inpatient Remains inpatient appropriate because: remains in afib on diltiazem  infusion, treatment for infection.   Planned Discharge Destination: Pending     Time spent: 35 minutes  Author: Alban Pepper, MD 06/09/2024 11:04 AM  For on call review [email or web address].

## 2024-06-09 NOTE — Care Management Important Message (Signed)
 Important Message  Patient Details  Name: Amber Avery MRN: [id number] Date of Birth: 1946-03-31   Important Message Given:  Yes - Medicare IM     Amber Avery 06/09/2024, 1:20 PM

## 2024-06-09 NOTE — Plan of Care (Signed)
   Problem: Coping: Goal: Ability to adjust to condition or change in health will improve Outcome: Progressing

## 2024-06-09 NOTE — TOC Progression Note (Addendum)
 Transition of Care [facility]) - Progression Note    Patient Details  Name: Amber Avery MRN: [id number] Date of Birth: 27-Oct-1945  Transition of [facility]) CM/SW Contact  Racheal LITTIE Schimke, RN Phone Number: 06/09/2024, 9:04 AM  Clinical Narrative: Care patrol, [place] notified to meet with patient and family to discuss discharge care. CM provided patient with Eliquis  patient assistant application to complete for medication discount.                      Expected Discharge Plan and Services                                               Social Drivers of Health (SDOH) Interventions SDOH Screenings   Food Insecurity: No Food Insecurity (06/08/2024)  Recent Concern: Food Insecurity - Food Insecurity Present (05/02/2024)  Housing: Low Risk  (06/08/2024)  Transportation Needs: No Transportation Needs (06/08/2024)  Utilities: Not At Risk (06/08/2024)  Alcohol Screen: Low Risk  (05/25/2024)  Depression (PHQ2-9): Low Risk  (05/25/2024)  Financial Resource Strain: Low Risk  (05/25/2024)  Physical Activity: Insufficiently Active (05/25/2024)  Social Connections: Socially Isolated (06/08/2024)  Stress: No Stress Concern Present (05/25/2024)  Tobacco Use: Medium Risk (06/08/2024)  Health Literacy: Adequate Health Literacy (05/25/2024)    Readmission Risk Interventions     No data to display

## 2024-06-09 NOTE — Progress Notes (Signed)
 Physical Therapy Treatment Patient Details Name: Amber Avery MRN: [id number] DOB: 1945-12-23 Today's Date: 06/09/2024   History of Present Illness Pt is a 78 y/o F presenting to ED with c/o weakness, HA, fever, chills. Workup for sepsis 2/2 UTI and new onset afib w/ RVR. PMH significant for asthma, T2DM, gout, HTN, dyslipidemia, OSA on CPAP.    PT Comments  Pt A&Ox4, agreeable to participate in PT, denies pain throughout session. Pt was met in recliner, expressing need to void. STS transfers <> recliner/toilet with RW and CGA. Pt able to amb to bathroom and back to bed (~34ft total) with RW and CGA. Pt visibly fatigued after completing any transfers/ambulation and requires prolonged rest breaks between all mobility tasks. Pt able to stand to complete pericare and hand hygiene with unilateral UE support and CGA-modA. Pt required minA for sit > supine to assist LEs back into bed and to scoot pt toward HOB. SpO2 remained WNL throughout session with pt on RA. Pt is progressing toward goals, but would benefit from further skilled PT intervention to continue improving her endurance capacity and allow for safe, independent mobility. Care team updated on pt status/assistance needs.   If plan is discharge home, recommend the following: A little help with walking and/or transfers;A little help with bathing/dressing/bathroom;Assistance with cooking/housework;Assist for transportation;Help with stairs or ramp for entrance   Can travel by private vehicle     Yes  Equipment Recommendations  Rolling walker (2 wheels)    Recommendations for Other Services       Precautions / Restrictions Precautions Precautions: Fall Restrictions Weight Bearing Restrictions Per Provider Order: No     Mobility  Bed Mobility Overal bed mobility: Needs Assistance Bed Mobility: Sit to Supine       Sit to supine: Min assist, Used rails   General bed mobility comments: minA to assist LEs into bed, minA to scoot up  in bed    Transfers Overall transfer level: Needs assistance Equipment used: Rolling walker (2 wheels) Transfers: Sit to/from Stand Sit to Stand: Contact guard assist           General transfer comment: STS <> recliner/toilet with RW and CGA, increased time and effort    Ambulation/Gait Ambulation/Gait assistance: Contact guard assist Gait Distance (Feet): 20 Feet Assistive device: Rolling walker (2 wheels) Gait Pattern/deviations: Step-through pattern, Shuffle, Trunk flexed, Wide base of support Gait velocity: decreased     General Gait Details: decreased cadence, minimal foot clearance bilaterally   Stairs             Wheelchair Mobility     Tilt Bed    Modified Rankin (Stroke Patients Only)       Balance Overall balance assessment: Needs assistance Sitting-balance support: Feet supported Sitting balance-Leahy Scale: Good     Standing balance support: Bilateral upper extremity supported, Single extremity supported, Reliant on assistive device for balance Standing balance-Leahy Scale: Fair Standing balance comment: Pt able to stand with at least unilateral UE support to perform pericare/hand hygiene, no LOB                            Communication Communication Communication: No apparent difficulties  Cognition Arousal: Alert Behavior During Therapy: WFL for tasks assessed/performed   PT - Cognitive impairments: No apparent impairments                       PT - Cognition Comments: A&Ox4 Following  commands: Impaired Following commands impaired: Follows one step commands with increased time    Cueing Cueing Techniques: Verbal cues, Visual cues  Exercises Other Exercises Other Exercises: SpO2 92-96% after amb to toilet with pt on RA. HR 86-93bpm. Pt able to perform pericare with supervision, required modA to assist with replacing brief    General Comments        Pertinent Vitals/Pain Pain Assessment Pain Assessment:  No/denies pain    Home Living                          Prior Function            PT Goals (current goals can now be found in the care plan section) Progress towards PT goals: Progressing toward goals    Frequency    Min 2X/week      PT Plan      Co-evaluation              AM-PAC PT 6 Clicks Mobility   Outcome Measure  Help needed turning from your back to your side while in a flat bed without using bedrails?: A Little Help needed moving from lying on your back to sitting on the side of a flat bed without using bedrails?: A Little Help needed moving to and from a bed to a chair (including a wheelchair)?: A Little Help needed standing up from a chair using your arms (e.g., wheelchair or bedside chair)?: A Little Help needed to walk in hospital room?: A Little Help needed climbing 3-5 steps with a railing? : A Lot 6 Click Score: 17    End of Session Equipment Utilized During Treatment: Gait belt Activity Tolerance: Patient limited by fatigue Patient left: in bed;with bed alarm set;with family/visitor present;with call bell/phone within reach Nurse Communication: Mobility status PT Visit Diagnosis: Other abnormalities of gait and mobility (R26.89);Muscle weakness (generalized) (M62.81);History of falling (Z91.81);Difficulty in walking, not elsewhere classified (R26.2)     Time: 8542-8476 PT Time Calculation (min) (ACUTE ONLY): 26 min  Charges:    $Therapeutic Activity: 23-37 mins PT General Charges $$ ACUTE PT VISIT: 1 Visit                     Luci Bellucci, SPT

## 2024-06-10 DIAGNOSIS — N390 Urinary tract infection, site not specified: Secondary | ICD-10-CM | POA: Diagnosis not present

## 2024-06-10 DIAGNOSIS — A4150 Gram-negative sepsis, unspecified: Secondary | ICD-10-CM | POA: Diagnosis not present

## 2024-06-10 DIAGNOSIS — I4891 Unspecified atrial fibrillation: Secondary | ICD-10-CM | POA: Diagnosis not present

## 2024-06-10 LAB — BASIC METABOLIC PANEL WITH GFR
Anion gap: 6 (ref 5–15)
BUN: 50 mg/dL — ABNORMAL HIGH (ref 8–23)
CO2: 23 mmol/L (ref 22–32)
Calcium: 8.9 mg/dL (ref 8.9–10.3)
Chloride: 109 mmol/L (ref 98–111)
Creatinine, Ser: 1.2 mg/dL — ABNORMAL HIGH (ref 0.44–1.00)
GFR, Estimated: 47 mL/min — ABNORMAL LOW
Glucose, Bld: 171 mg/dL — ABNORMAL HIGH (ref 70–99)
Potassium: 4.1 mmol/L (ref 3.5–5.1)
Sodium: 138 mmol/L (ref 135–145)

## 2024-06-10 LAB — PROTIME-INR
INR: 1.4 — ABNORMAL HIGH (ref 0.8–1.2)
Prothrombin Time: 17.7 s — ABNORMAL HIGH (ref 11.4–15.2)

## 2024-06-10 LAB — MAGNESIUM: Magnesium: 2.5 mg/dL — ABNORMAL HIGH (ref 1.7–2.4)

## 2024-06-10 LAB — GLUCOSE, CAPILLARY
Glucose-Capillary: 177 mg/dL — ABNORMAL HIGH (ref 70–99)
Glucose-Capillary: 164 mg/dL — ABNORMAL HIGH (ref 70–99)
Glucose-Capillary: 160 mg/dL — ABNORMAL HIGH (ref 70–99)
Glucose-Capillary: 153 mg/dL — ABNORMAL HIGH (ref 70–99)

## 2024-06-10 MED ORDER — WARFARIN SODIUM 7.5 MG PO TABS
7.5000 mg | ORAL_TABLET | Freq: Once | ORAL | Status: AC
  Administered 2024-06-10: 7.5 mg via ORAL
  Filled 2024-06-10: qty 1

## 2024-06-10 MED ORDER — INSULIN ASPART 100 UNIT/ML IJ SOLN
2.0000 [IU] | Freq: Three times a day (TID) | INTRAMUSCULAR | Status: DC
  Administered 2024-06-10 – 2024-06-12 (×7): 2 [IU] via SUBCUTANEOUS
  Filled 2024-06-10 (×5): qty 1

## 2024-06-10 MED ORDER — INSULIN GLARGINE 100 UNIT/ML ~~LOC~~ SOLN
7.0000 [IU] | Freq: Every day | SUBCUTANEOUS | Status: DC
  Administered 2024-06-10: 7 [IU] via SUBCUTANEOUS
  Filled 2024-06-10: qty 0.07

## 2024-06-10 MED ORDER — DILTIAZEM HCL 30 MG PO TABS
60.0000 mg | ORAL_TABLET | Freq: Four times a day (QID) | ORAL | Status: DC
  Administered 2024-06-10 – 2024-06-12 (×7): 60 mg via ORAL
  Filled 2024-06-10 (×7): qty 2

## 2024-06-10 MED ORDER — SALINE SPRAY 0.65 % NA SOLN
1.0000 | NASAL | Status: DC | PRN
  Administered 2024-06-10 – 2024-06-11 (×2): 1 via NASAL
  Filled 2024-06-10: qty 44

## 2024-06-10 NOTE — Plan of Care (Signed)
  Problem: Respiratory: Goal: Ability to maintain adequate ventilation will improve Outcome: Progressing   Problem: Clinical Measurements: Goal: Ability to maintain clinical measurements within normal limits will improve Outcome: Progressing   Problem: Activity: Goal: Risk for activity intolerance will decrease Outcome: Not Progressing

## 2024-06-10 NOTE — Progress Notes (Signed)
 PHARMACY - ANTICOAGULATION CONSULT NOTE  Pharmacy Consult for warfarin Indication: atrial fibrillation  Allergies  Allergen Reactions   Codeine Hives and Nausea And Vomiting    Patient Measurements: Height: 5' 1 (154.9 cm) Weight: 114.4 kg (252 lb 1.6 oz) IBW/kg (Calculated) : 47.8  Vital Signs: Temp: 97.7 F (36.5 C) (08/23 0756) Temp Source: Oral (08/23 0423) BP: 123/69 (08/23 0756) Pulse Rate: 50 (08/23 0756)  Labs: Recent Labs    06/08/24 0541 06/09/24 0606 06/10/24 0532  HGB 12.6  --   --   HCT 37.8  --   --   PLT 151  --   --   LABPROT  --   --  17.7*  INR  --   --  1.4*  CREATININE 1.51* 1.51* 1.20*    Estimated Creatinine Clearance: 46.8 mL/min (A) (by C-G formula based on SCr of 1.2 mg/dL (H)).   Medical History: Past Medical History:  Diagnosis Date   Allergy    Arthritis    Asthma    Diabetes mellitus without complication (HCC)    Gout    Hyperlipidemia    Hypertension    Iron  deficiency anemia 04/20/2018   Oxygen  deficiency    PONV (postoperative nausea and vomiting)    after hemorroid surgery   Shortness of breath dyspnea    Sleep apnea     Medications:  Scheduled:   allopurinol   50 mg Oral Daily   atorvastatin   20 mg Oral Daily   cefadroxil   500 mg Oral BID   cholecalciferol   1,000 Units Oral Daily   cyanocobalamin   500 mcg Oral Daily   [START ON 06/14/2024] dapagliflozin  propanediol  10 mg Oral Q breakfast   diclofenac  Sodium  4 g Topical QID   insulin  aspart  0-15 Units Subcutaneous TID WC   insulin  aspart  0-5 Units Subcutaneous QHS   insulin  glargine  5 Units Subcutaneous Q2200   loratadine   10 mg Oral Daily   metoprolol  tartrate  25 mg Oral Q6H   neomycin -bacitracin -polymyxin   Topical Daily   potassium chloride  SA  20 mEq Oral Daily   Warfarin - Pharmacist Dosing Inpatient   Does not apply q1600   Infusions:   diltiazem  (CARDIZEM ) infusion 2.5 mg/hr (06/09/24 1705)   PRN: acetaminophen  **OR** acetaminophen , albuterol ,  magnesium  hydroxide, ondansetron  **OR** ondansetron  (ZOFRAN ) IV, sodium chloride , traMADol , traZODone   Assessment: 78 y.o. female with medical history significant for asthma, CKD3b, type diabetes mellitus, gout, hypertension, dyslipidemia and OSA, who presented to the emergency room with sepsis due to gram-negative UTI. She was started on Eliquis  but is concerned about the affordability   Goal of Therapy:  INR 2-3 Monitor platelets by anticoagulation protocol: Yes  Date:  INR: Dose: 8/23 1.4 7.5mg  ordered  Plan:  ---stop apixaban  ---warfarin 7.5 mg po x 1 ---daily INR to guide therapy ---CBC at least once weekly while inpatient  [facility] A Kalep Full 06/10/2024,8:02 AM

## 2024-06-10 NOTE — Progress Notes (Signed)
 Patient ID: Amber Avery Brighter, female   DOB: 1946-02-16, 78 y.o.   MRN: [id number] [facility] Cardiology    SUBJECTIVE: Patient feeling somewhat better she has been up in the chair using the CPAP she is concerned about the price of Eliquis  and is willing to use Coumadin  she is visiting with her pastor.  Would like to be transition from IV diltiazem  to p.o. medication   Vitals:   06/10/24 0423 06/10/24 0756 06/10/24 1103 06/10/24 1152  BP: 129/87 123/69 126/75   Pulse: 96 (!) 50 (!) 101 80  Resp: 18     Temp: 98.5 F (36.9 C) 97.7 F (36.5 C) 97.7 F (36.5 C)   TempSrc: Oral     SpO2: 96% 95% 95%   Weight:      Height:         Intake/Output Summary (Last 24 hours) at 06/10/2024 1330 Last data filed at 06/10/2024 1106 Gross per 24 hour  Intake 927.73 ml  Output 1000 ml  Net -72.27 ml      PHYSICAL EXAM  General: Well developed, well nourished, in no acute distress HEENT:  Normocephalic and atramatic Neck:  No JVD.  Lungs: Clear bilaterally to auscultation and percussion. Heart: HRRR . Normal S1 and S2 without gallops or murmurs.  Abdomen: Bowel sounds are positive, abdomen soft and non-tender  Msk:  Back normal, normal gait. Normal strength and tone for age. Extremities: No clubbing, cyanosis or edema.   Neuro: Alert and oriented X 3. Psych:  Good affect, responds appropriately   LABS: Basic Metabolic Panel: Recent Labs    06/09/24 0606 06/10/24 0532  NA 135 138  K 3.8 4.1  CL 104 109  CO2 22 23  GLUCOSE 162* 171*  BUN 55* 50*  CREATININE 1.51* 1.20*  CALCIUM  8.5* 8.9  MG 2.6* 2.5*   Liver Function Tests: No results for input(s): AST, ALT, ALKPHOS, BILITOT, PROT, ALBUMIN in the last 72 hours. No results for input(s): LIPASE, AMYLASE in the last 72 hours. CBC: Recent Labs    06/08/24 0541  WBC 12.6*  HGB 12.6  HCT 37.8  MCV 93.6  PLT 151   Cardiac Enzymes: No results for input(s): CKTOTAL, CKMB, CKMBINDEX, TROPONINI in the last 72  hours. BNP: Invalid input(s): POCBNP D-Dimer: No results for input(s): DDIMER in the last 72 hours. Hemoglobin A1C: No results for input(s): HGBA1C in the last 72 hours. Fasting Lipid Panel: No results for input(s): CHOL, HDL, LDLCALC, TRIG, CHOLHDL, LDLDIRECT in the last 72 hours. Thyroid  Function Tests: No results for input(s): TSH, T4TOTAL, T3FREE, THYROIDAB in the last 72 hours.  Invalid input(s): FREET3 Anemia Panel: No results for input(s): VITAMINB12, FOLATE, FERRITIN, TIBC, IRON , RETICCTPCT in the last 72 hours.  No results found.   Echo preserved left ventricular function 50 to 55%  TELEMETRY: A-fib rate of 60 nonspecific findings:  ASSESSMENT AND PLAN:  Principal Problem:   Sepsis due to gram-negative UTI (HCC) Active Problems:   Dyslipidemia   Atrial fibrillation with rapid ventricular response (HCC)   Controlled type 2 diabetes mellitus without complication, without long-term current use of insulin  (HCC)   Gout    Plan Atrial fibrillation rate controlled on oral metoprolol  and diltiazem  continue with warfarin unable to afford Eliquis  INR goal is between 2 and 3 Statin therapy for hyperlipidemia Continue diabetes management and control For obesity recommend weight loss excise portion control Obstructive sleep apnea continue CPAP weight loss follow-up with pulmonary Recommend physical therapy for balance and ambulation in anticipation  of discharge to further rehab skilled nursing facility be necessary versus discharge home with home health Have the patient follow-up with cardiology as an outpatient Renal insufficiency consider having the patient follow-up with nephrology stage IIIb    Cara JONETTA Lovelace, MD,  06/10/2024 1:30 PM

## 2024-06-10 NOTE — Progress Notes (Signed)
 Progress Note   Patient: Amber Avery FMW:[id number] DOB: 05-08-1946 DOA: 06/06/2024     3 DOS: the patient was seen and examined on 06/10/2024   Brief hospital course: SHAINA GULLATT is a 78 y.o. obese female with medical history significant for asthma, CKD3b, type diabetes mellitus, gout, hypertension, dyslipidemia and OSA, who presented to the emergency room with acute onset of malaise and feeling sick since Tuesday with diaphoresis.  She had chills on Saturday.  She admits to urinary frequency and urgency without dysuria or hematuria or flank pain.  No chest pain or palpitations.  No dyspnea or cough or wheezing.  No nausea or vomiting or abdominal pain.  She denies any history of atrial fibrillation.    Assessment and Plan: * Sepsis due to gram-negative UTI (HCC) Sepsis physiology present on arrival, now resolved Urine cultures growing Klebsiella pneumoniae, Status post 3 doses of ceftriaxone  -Continue cefadroxil  for 2 more days of antibiotics   Atrial fibrillation with rapid ventricular response RVR resolved  In the setting of above  Rate controlled,  - CHA2DS2-VASc score is 5. TTE with EF 50 to 55%, RV systolic function low normal, RV size mildly enlarged, no significant valvular abnormality Per cardiology  Metop tartrate 25mg  q6h, dilt infusion, warfarin due to cost of eliquis . Will likely come off dilt infsuion this PM    Controlled type 2 diabetes mellitus without complication, without long-term current use of insulin  (HCC) A1c 7.3  - SSI CBG (last 3)  Recent Labs    06/10/24 0757 06/10/24 1104 06/10/24 1642  GLUCAP 177* 164* 160*    - Will continue Farxiga  and hold off metformin .  Dyslipidemia - Will continue statin therapy.  Gout - Will continue allopurinol  at reduced dose given renal dysfunction   CKD 3b BL serum creatinine appears to be ~1.32  at baseline this AM.     Latest Ref Rng & Units 06/10/2024    5:32 AM 06/09/2024    6:06 AM 06/08/2024     5:41 AM  BMP  Glucose 70 - 99 mg/dL 828  837  822   BUN 8 - 23 mg/dL 50  55  43   Creatinine 0.44 - 1.00 mg/dL 8.79  8.48  8.48   Sodium 135 - 145 mmol/L 138  135  132   Potassium 3.5 - 5.1 mmol/L 4.1  3.8  3.9   Chloride 98 - 111 mmol/L 109  104  101   CO2 22 - 32 mmol/L 23  22  22    Calcium  8.9 - 10.3 mg/dL 8.9  8.5  8.3   Avoid nephrotoxic agents  Will continue to monitor  Nephrology referral sent     BMI:  47.63 kg/m Obesity class III  Complicates care and overall prognosis.      Subjective: No issues overnight. No chest pain. Has some mild chronic dyspnea with moving.   Physical Exam: Vitals:   06/10/24 0756 06/10/24 1103 06/10/24 1152 06/10/24 1641  BP: 123/69 126/75  (!) 134/96  Pulse: (!) 50 (!) 101 80 (!) 47  Resp:    16  Temp: 97.7 F (36.5 C) 97.7 F (36.5 C)  97.8 F (36.6 C)  TempSrc:    Oral  SpO2: 95% 95%  97%  Weight:      Height:        Constitutional: In no distress.  Cardiovascular: Irregluarly irregular No lower extremity edema  Pulmonary: Non labored breathing on room air, no wheezing or rales.   Abdominal: Soft.  Normal bowel sounds. Non distended and non tender Musculoskeletal: Normal range of motion.     Neurological: Alert and oriented to person, place, and time. Non focal  Skin: Skin is warm and dry.   Data Reviewed:     Latest Ref Rng & Units 06/08/2024    5:41 AM 06/06/2024   10:59 PM 05/05/2024    2:36 PM  CBC  WBC 4.0 - 10.5 K/uL 12.6  10.7  6.7   Hemoglobin 12.0 - 15.0 g/dL 87.3  85.8  85.3   Hematocrit 36.0 - 46.0 % 37.8  42.5  45.0   Platelets 150 - 400 K/uL 151  142  215       Latest Ref Rng & Units 06/10/2024    5:32 AM 06/09/2024    6:06 AM 06/08/2024    5:41 AM  BMP  Glucose 70 - 99 mg/dL 828  837  822   BUN 8 - 23 mg/dL 50  55  43   Creatinine 0.44 - 1.00 mg/dL 8.79  8.48  8.48   Sodium 135 - 145 mmol/L 138  135  132   Potassium 3.5 - 5.1 mmol/L 4.1  3.8  3.9   Chloride 98 - 111 mmol/L 109  104  101   CO2 22 - 32  mmol/L 23  22  22    Calcium  8.9 - 10.3 mg/dL 8.9  8.5  8.3       Family Communication: None at bedside. Patient noted understanding of plan.   Disposition: Status is: Inpatient Remains inpatient appropriate because: remains in afib on diltiazem  infusion, treatment for infection.   Planned Discharge Destination: Pending     Time spent: 35 minutes  Author: Alban Pepper, MD 06/10/2024 5:59 PM  For on call review [email or web address].

## 2024-06-10 NOTE — Progress Notes (Signed)
 Occupational Therapy Treatment Patient Details Name: Amber Avery MRN: [id number] DOB: 02-01-1946 Today's Date: 06/10/2024   History of present illness Pt is a 78 y/o F presenting to ED with c/o weakness, HA, fever, chills. Workup for sepsis 2/2 UTI and new onset afib w/ RVR. PMH significant for asthma, T2DM, gout, HTN, dyslipidemia, OSA on CPAP.   OT comments  Pt. Requires minA LE ADLs 2/2 limited reach. Pt. Education was provided with a review of A/E use to assist with LE ADLs. Pt. Reports having a reacher, sockaide, and LH shoehorn at home. Pt. Was assisted with problem solving through anticipated home ADL/IADL needs. Reviewed Pt.'s home routines, home set-up, and available assist.  Pt. Continues to  benefit from OT services for ADL training, A/E training, UE there. Ex. and Pt./caregiver education about home modification, and DME.       If plan is discharge home, recommend the following:  A little help with walking and/or transfers;A little help with bathing/dressing/bathroom;Help with stairs or ramp for entrance   Equipment Recommendations  BSC/3in1    Recommendations for Other Services      Precautions / Restrictions Precautions Precautions: Fall Restrictions Weight Bearing Restrictions Per Provider Order: No       Mobility Bed Mobility               General bed mobility comments: Pt. sitting up in the recliner upon arrival    Transfers Overall transfer level: Needs assistance Equipment used: Rolling walker (2 wheels) Transfers: Sit to/from Stand Sit to Stand: Contact guard assist                 Balance                                           ADL either performed or assessed with clinical judgement   ADL Overall ADL's : Needs assistance/impaired                     Lower Body Dressing: Minimal assistance                      Extremity/Trunk Assessment Upper Extremity Assessment Upper Extremity Assessment:  Generalized weakness            Vision Patient Visual Report: No change from baseline     Perception     Praxis     Communication     Cognition Arousal: Alert Behavior During Therapy: WFL for tasks assessed/performed Cognition: No apparent impairments                               Following commands: Impaired Following commands impaired: Follows one step commands with increased time      Cueing   Cueing Techniques: Verbal cues, Visual cues  Exercises      Shoulder Instructions       General Comments      Pertinent Vitals/ Pain       Pain Assessment Pain Assessment: No/denies pain  Home Living                                          Prior Functioning/Environment  Frequency  Min 3X/week        Progress Toward Goals  OT Goals(current goals can now be found in the care plan section)  Progress towards OT goals: Progressing toward goals  Acute Rehab OT Goals Patient Stated Goal: To go to rehab prior to returning home OT Goal Formulation: With patient Time For Goal Achievement: 06/23/24 Potential to Achieve Goals: Good  Plan      Co-evaluation                 AM-PAC OT 6 Clicks Daily Activity     Outcome Measure   Help from another person eating meals?: None Help from another person taking care of personal grooming?: A Little Help from another person toileting, which includes using toliet, bedpan, or urinal?: A Lot Help from another person bathing (including washing, rinsing, drying)?: A Lot Help from another person to put on and taking off regular upper body clothing?: None Help from another person to put on and taking off regular lower body clothing?: A Little 6 Click Score: 18    End of Session Equipment Utilized During Treatment: Rolling walker (2 wheels)  OT Visit Diagnosis: Other abnormalities of gait and mobility (R26.89);Muscle weakness (generalized) (M62.81)   Activity  Tolerance Patient tolerated treatment well   Patient Left     Nurse Communication Mobility status        Time: 1335-1400 OT Time Calculation (min): 25 min  Charges: OT General Charges $OT Visit: 1 Visit OT Treatments $Self Care/Home Management : 23-37 mins  Richardson Otter, MS, OTR/L   Richardson Otter 06/10/2024, 3:37 PM

## 2024-06-11 DIAGNOSIS — A4150 Gram-negative sepsis, unspecified: Secondary | ICD-10-CM | POA: Diagnosis not present

## 2024-06-11 DIAGNOSIS — N390 Urinary tract infection, site not specified: Secondary | ICD-10-CM | POA: Diagnosis not present

## 2024-06-11 DIAGNOSIS — I4891 Unspecified atrial fibrillation: Secondary | ICD-10-CM | POA: Diagnosis not present

## 2024-06-11 LAB — CULTURE, BLOOD (ROUTINE X 2)
Culture: NO GROWTH
Culture: NO GROWTH
Special Requests: ADEQUATE

## 2024-06-11 LAB — MAGNESIUM: Magnesium: 2.4 mg/dL (ref 1.7–2.4)

## 2024-06-11 LAB — GLUCOSE, CAPILLARY
Glucose-Capillary: 156 mg/dL — ABNORMAL HIGH (ref 70–99)
Glucose-Capillary: 159 mg/dL — ABNORMAL HIGH (ref 70–99)
Glucose-Capillary: 162 mg/dL — ABNORMAL HIGH (ref 70–99)
Glucose-Capillary: 154 mg/dL — ABNORMAL HIGH (ref 70–99)

## 2024-06-11 LAB — BASIC METABOLIC PANEL WITH GFR
Anion gap: 8 (ref 5–15)
BUN: 46 mg/dL — ABNORMAL HIGH (ref 8–23)
CO2: 21 mmol/L — ABNORMAL LOW (ref 22–32)
Calcium: 9.1 mg/dL (ref 8.9–10.3)
Chloride: 112 mmol/L — ABNORMAL HIGH (ref 98–111)
Creatinine, Ser: 0.98 mg/dL (ref 0.44–1.00)
GFR, Estimated: 60 mL/min — ABNORMAL LOW
Glucose, Bld: 162 mg/dL — ABNORMAL HIGH (ref 70–99)
Potassium: 4.3 mmol/L (ref 3.5–5.1)
Sodium: 141 mmol/L (ref 135–145)

## 2024-06-11 LAB — PROTIME-INR
INR: 1.4 — ABNORMAL HIGH (ref 0.8–1.2)
Prothrombin Time: 18 s — ABNORMAL HIGH (ref 11.4–15.2)

## 2024-06-11 MED ORDER — LACTULOSE 10 GM/15ML PO SOLN
20.0000 g | Freq: Every day | ORAL | Status: DC | PRN
  Filled 2024-06-11: qty 30

## 2024-06-11 MED ORDER — INSULIN GLARGINE 100 UNIT/ML ~~LOC~~ SOLN
9.0000 [IU] | Freq: Every day | SUBCUTANEOUS | Status: DC
  Administered 2024-06-11: 9 [IU] via SUBCUTANEOUS
  Filled 2024-06-11 (×2): qty 0.09

## 2024-06-11 MED ORDER — MAGNESIUM HYDROXIDE 400 MG/5ML PO SUSP: 30.0000 mL | Freq: Every day | ORAL | Status: DC | PRN

## 2024-06-11 MED ORDER — POLYETHYLENE GLYCOL 3350 17 G PO PACK: 17.0000 g | PACK | Freq: Every day | ORAL | Status: DC | PRN

## 2024-06-11 MED ORDER — WARFARIN SODIUM 7.5 MG PO TABS
7.5000 mg | ORAL_TABLET | Freq: Once | ORAL | Status: AC
  Administered 2024-06-11: 7.5 mg via ORAL
  Filled 2024-06-11: qty 1

## 2024-06-11 NOTE — Progress Notes (Signed)
 Progress Note   Patient: Amber Avery FMW:[id number] DOB: 1946-10-04 DOA: 06/06/2024     4 DOS: the patient was seen and examined on 06/11/2024   Brief hospital course: Amber Avery is a 78 y.o. obese female with medical history significant for asthma, CKD3b, type diabetes mellitus, gout, hypertension, dyslipidemia and OSA, who presented to the emergency room with acute onset of malaise and feeling sick since Tuesday with diaphoresis.  She had chills on Saturday.  She admits to urinary frequency and urgency without dysuria or hematuria or flank pain.  No chest pain or palpitations.  No dyspnea or cough or wheezing.  No nausea or vomiting or abdominal pain.  She denies any history of atrial fibrillation.    Assessment and Plan: * Sepsis due to gram-negative UTI (HCC) Sepsis physiology present on arrival, now resolved Urine cultures growing Klebsiella pneumoniae, Status post 3 doses of ceftriaxone  -Continue cefadroxil  for 2 more days of antibiotics   Atrial fibrillation with rapid ventricular response RVR resolved  In the setting of above  Rate controlled,  - CHA2DS2-VASc score is 5. TTE with EF 50 to 55%, RV systolic function low normal, RV size mildly enlarged, no significant valvular abnormality Per cardiology  Metop tartrate 25mg  q6h, dilt 60 mg every 6 hours, warfarin due to cost of eliquis .  INR 1.4 this a.m.   Controlled type 2 diabetes mellitus without complication, without long-term current use of insulin  (HCC) A1c 7.3  - SSI CBG (last 3)  Recent Labs    06/10/24 1104 06/10/24 1642 06/10/24 2108  GLUCAP 164* 160* 153*   -Holding Farxiga  and metformin . Basal bolus insulin  regimen while inpatient.   Dyslipidemia - Will continue statin therapy.  Gout - Will continue allopurinol  at reduced dose given renal dysfunction   CKD 3b BL serum creatinine appears to be ~1.32  at baseline this AM.     Latest Ref Rng & Units 06/11/2024    6:36 AM 06/10/2024    5:32 AM  06/09/2024    6:06 AM  BMP  Glucose 70 - 99 mg/dL 837  828  837   BUN 8 - 23 mg/dL 46  50  55   Creatinine 0.44 - 1.00 mg/dL 9.01  8.79  8.48   Sodium 135 - 145 mmol/L 141  138  135   Potassium 3.5 - 5.1 mmol/L 4.3  4.1  3.8   Chloride 98 - 111 mmol/L 112  109  104   CO2 22 - 32 mmol/L 21  23  22    Calcium  8.9 - 10.3 mg/dL 9.1  8.9  8.5   Avoid nephrotoxic agents  Will continue to monitor  Nephrology referral sent     BMI:  47.63 kg/m Obesity class III  Complicates care and overall prognosis.      Subjective: Feeling much improved.   Physical Exam: Vitals:   06/10/24 1641 06/10/24 2108 06/11/24 0054 06/11/24 0402  BP: (!) 134/96 116/80 111/77 108/80  Pulse: (!) 47 65 73 (!) 54  Resp: 16 18 18 18   Temp: 97.8 F (36.6 C) 98.2 F (36.8 C) 98 F (36.7 C) 98.2 F (36.8 C)  TempSrc: Oral Oral Oral Oral  SpO2: 97% 96% 95% 98%  Weight:      Height:        Constitutional: In no distress.  Cardiovascular: Irregularly irregular trace bilateral lower extremity edema  Pulmonary: Non labored breathing on room air, no wheezing or rales.   Abdominal: Soft. Non distended and non tender  Musculoskeletal: Normal range of motion.     Neurological: Alert and oriented to person, place, and time. Non focal  Skin: Skin is warm and dry.     Data Reviewed:     Latest Ref Rng & Units 06/08/2024    5:41 AM 06/06/2024   10:59 PM 05/05/2024    2:36 PM  CBC  WBC 4.0 - 10.5 K/uL 12.6  10.7  6.7   Hemoglobin 12.0 - 15.0 g/dL 87.3  85.8  85.3   Hematocrit 36.0 - 46.0 % 37.8  42.5  45.0   Platelets 150 - 400 K/uL 151  142  215       Latest Ref Rng & Units 06/11/2024    6:36 AM 06/10/2024    5:32 AM 06/09/2024    6:06 AM  BMP  Glucose 70 - 99 mg/dL 837  828  837   BUN 8 - 23 mg/dL 46  50  55   Creatinine 0.44 - 1.00 mg/dL 9.01  8.79  8.48   Sodium 135 - 145 mmol/L 141  138  135   Potassium 3.5 - 5.1 mmol/L 4.3  4.1  3.8   Chloride 98 - 111 mmol/L 112  109  104   CO2 22 - 32 mmol/L  21  23  22    Calcium  8.9 - 10.3 mg/dL 9.1  8.9  8.5       Family Communication: None at bedside. Patient noted understanding of plan.   Disposition: Status is: Inpatient Remains inpatient for further monitoring of her A-fib off of diltiazem  infusion.  As well as initiation of Coumadin .  Can likely discharge in the next day or so  Planned Discharge Destination: Pending     Time spent: 35 minutes  Author: Alban Pepper, MD 06/11/2024 8:35 AM  For on call review [email or web address].

## 2024-06-11 NOTE — Progress Notes (Signed)
 Patient ID: Amber Avery, female   DOB: 1946/10/05, 78 y.o.   MRN: [id number] [facility]    SUBJECTIVE: Patient states she feels somewhat better she has been up little bit fatigued breathing appears to be reasonable denies any wheezing heart rates much improved   Vitals:   06/10/24 2108 06/11/24 0054 06/11/24 0402 06/11/24 0837  BP: 116/80 111/77 108/80 (!) 111/93  Pulse: 65 73 (!) 54 (!) 56  Resp: 18 18 18 16   Temp: 98.2 F (36.8 C) 98 F (36.7 C) 98.2 F (36.8 C) 98.2 F (36.8 C)  TempSrc: Oral Oral Oral   SpO2: 96% 95% 98% 97%  Weight:      Height:         Intake/Output Summary (Last 24 hours) at 06/11/2024 1122 Last data filed at 06/11/2024 1040 Gross per 24 hour  Intake 985.15 ml  Output --  Net 985.15 ml      PHYSICAL EXAM  General: Well developed, well nourished, in no acute distress HEENT:  Normocephalic and atramatic Neck:  No JVD.  Lungs: Clear bilaterally to auscultation and percussion. Heart: HRRR . Normal S1 and S2 without gallops or murmurs.  Abdomen: Bowel sounds are positive, abdomen soft and non-tender  Msk:  Back normal, normal gait. Normal strength and tone for age. Extremities: No clubbing, cyanosis or edema.   Neuro: Alert and oriented X 3. Psych:  Good affect, responds appropriately   LABS: Basic Metabolic Panel: Recent Labs    06/10/24 0532 06/11/24 0636  NA 138 141  K 4.1 4.3  CL 109 112*  CO2 23 21*  GLUCOSE 171* 162*  BUN 50* 46*  CREATININE 1.20* 0.98  CALCIUM  8.9 9.1  MG 2.5* 2.4   Liver Function Tests: No results for input(s): AST, ALT, ALKPHOS, BILITOT, PROT, ALBUMIN in the last 72 hours. No results for input(s): LIPASE, AMYLASE in the last 72 hours. CBC: No results for input(s): WBC, NEUTROABS, HGB, HCT, MCV, PLT in the last 72 hours. Cardiac Enzymes: No results for input(s): CKTOTAL, CKMB, CKMBINDEX, TROPONINI in the last 72 hours. BNP: Invalid input(s):  POCBNP D-Dimer: No results for input(s): DDIMER in the last 72 hours. Hemoglobin A1C: No results for input(s): HGBA1C in the last 72 hours. Fasting Lipid Panel: No results for input(s): CHOL, HDL, LDLCALC, TRIG, CHOLHDL, LDLDIRECT in the last 72 hours. Thyroid  Function Tests: No results for input(s): TSH, T4TOTAL, T3FREE, THYROIDAB in the last 72 hours.  Invalid input(s): FREET3 Anemia Panel: No results for input(s): VITAMINB12, FOLATE, FERRITIN, TIBC, IRON , RETICCTPCT in the last 72 hours.  No results found.   Echo preserved left ventricular function  TELEMETRY: Atrial fibrillation rate of 60:  ASSESSMENT AND PLAN:  Principal Problem:   Sepsis due to gram-negative UTI [facility]) Active Problems:   Dyslipidemia   Atrial fibrillation with rapid ventricular response (HCC)   Controlled type 2 diabetes mellitus without complication, without long-term current use of insulin  (HCC)   Gout    Plan Continue anticoagulation for atrial fibrillation will use warfarin instead of Eliquis  because of expense continue rate control metoprolol  diltiazem  Continue inhalers for COPD shortness of breath symptoms Maintain statin therapy hyperlipidemia Agree with CPAP for obstructive sleep apnea Continue diabetes management and control For obesity recommend weight loss exercise portion control Antibiotic therapy for what appears to be a urinary tract infection   Amber JONETTA Lovelace, MD, 06/11/2024 11:22 AM

## 2024-06-11 NOTE — Progress Notes (Signed)
 PHARMACY - ANTICOAGULATION CONSULT NOTE  Pharmacy Consult for warfarin Indication: atrial fibrillation  Allergies  Allergen Reactions   Codeine Hives and Nausea And Vomiting    Patient Measurements: Height: 5' 1 (154.9 cm) Weight: 114.4 kg (252 lb 1.6 oz) IBW/kg (Calculated) : 47.8  Vital Signs: Temp: 98.2 F (36.8 C) (08/24 0402) Temp Source: Oral (08/24 0402) BP: 108/80 (08/24 0402) Pulse Rate: 54 (08/24 0402)  Labs: Recent Labs    06/09/24 0606 06/10/24 0532 06/11/24 0636  LABPROT  --  17.7* 18.0*  INR  --  1.4* 1.4*  CREATININE 1.51* 1.20* 0.98    Estimated Creatinine Clearance: 57.4 mL/min (by C-G formula based on SCr of 0.98 mg/dL).   Medical History: Past Medical History:  Diagnosis Date   Allergy    Arthritis    Asthma    Diabetes mellitus without complication (HCC)    Gout    Hyperlipidemia    Hypertension    Iron  deficiency anemia 04/20/2018   Oxygen  deficiency    PONV (postoperative nausea and vomiting)    after hemorroid surgery   Shortness of breath dyspnea    Sleep apnea     Medications:  Scheduled:   allopurinol   50 mg Oral Daily   atorvastatin   20 mg Oral Daily   cefadroxil   500 mg Oral BID   cholecalciferol   1,000 Units Oral Daily   cyanocobalamin   500 mcg Oral Daily   [START ON 06/14/2024] dapagliflozin  propanediol  10 mg Oral Q breakfast   diclofenac  Sodium  4 g Topical QID   diltiazem   60 mg Oral Q6H   insulin  aspart  0-15 Units Subcutaneous TID WC   insulin  aspart  0-5 Units Subcutaneous QHS   insulin  aspart  2 Units Subcutaneous TID WC   insulin  glargine  7 Units Subcutaneous Q2200   loratadine   10 mg Oral Daily   metoprolol  tartrate  25 mg Oral Q6H   neomycin -bacitracin -polymyxin   Topical Daily   potassium chloride  SA  20 mEq Oral Daily   Warfarin - Pharmacist Dosing Inpatient   Does not apply q1600   Infusions:    PRN: acetaminophen  **OR** acetaminophen , albuterol , magnesium  hydroxide, ondansetron  **OR** ondansetron   (ZOFRAN ) IV, sodium chloride , traMADol , traZODone   Assessment: 78 y.o. female with medical history significant for asthma, CKD3b, type diabetes mellitus, gout, hypertension, dyslipidemia and OSA, who presented to the emergency room with sepsis due to gram-negative UTI. She was started on Eliquis  but is concerned about the affordability   Goal of Therapy:  INR 2-3 Monitor platelets by anticoagulation protocol: Yes  Date:  INR: Dose: 8/23 1.4 7.5mg  8/24 1.4 7.5mg  ordered  Plan:  ---warfarin 7.5 mg po x 1 ---daily INR to guide therapy ---CBC at least once weekly while inpatient  Elice Crigger A Ramata Strothman, PharmD Clinical Pharmacist 06/11/2024 7:26 AM

## 2024-06-12 DIAGNOSIS — I4891 Unspecified atrial fibrillation: Secondary | ICD-10-CM | POA: Diagnosis not present

## 2024-06-12 LAB — CBC
HCT: 41.5 % (ref 36.0–46.0)
Hemoglobin: 13.4 g/dL (ref 12.0–15.0)
MCH: 30.5 pg (ref 26.0–34.0)
MCHC: 32.3 g/dL (ref 30.0–36.0)
MCV: 94.5 fL (ref 80.0–100.0)
Platelets: 361 10*3/uL (ref 150–400)
RBC: 4.39 MIL/uL (ref 3.87–5.11)
RDW: 16 % — ABNORMAL HIGH (ref 11.5–15.5)
WBC: 10.1 10*3/uL (ref 4.0–10.5)
nRBC: 0 % (ref 0.0–0.2)

## 2024-06-12 LAB — GLUCOSE, CAPILLARY
Glucose-Capillary: 207 mg/dL — ABNORMAL HIGH (ref 70–99)
Glucose-Capillary: 103 mg/dL — ABNORMAL HIGH (ref 70–99)
Glucose-Capillary: 150 mg/dL — ABNORMAL HIGH (ref 70–99)
Glucose-Capillary: 164 mg/dL — ABNORMAL HIGH (ref 70–99)

## 2024-06-12 LAB — PROTIME-INR
INR: 1.9 — ABNORMAL HIGH (ref 0.8–1.2)
Prothrombin Time: 22.6 s — ABNORMAL HIGH (ref 11.4–15.2)

## 2024-06-12 MED ORDER — DILTIAZEM HCL 30 MG PO TABS
60.0000 mg | ORAL_TABLET | Freq: Four times a day (QID) | ORAL | Status: AC
  Administered 2024-06-12: 60 mg via ORAL
  Filled 2024-06-12: qty 2

## 2024-06-12 MED ORDER — ALLOPURINOL 100 MG PO TABS
100.0000 mg | ORAL_TABLET | Freq: Two times a day (BID) | ORAL | Status: DC
  Administered 2024-06-12 – 2024-06-16 (×8): 100 mg via ORAL
  Filled 2024-06-12 (×8): qty 1

## 2024-06-12 MED ORDER — DILTIAZEM HCL ER COATED BEADS 180 MG PO CP24
180.0000 mg | ORAL_CAPSULE | Freq: Every day | ORAL | Status: DC
  Administered 2024-06-13: 180 mg via ORAL
  Filled 2024-06-12: qty 1

## 2024-06-12 MED ORDER — WARFARIN SODIUM 7.5 MG PO TABS
7.5000 mg | ORAL_TABLET | Freq: Once | ORAL | Status: AC
  Administered 2024-06-12: 7.5 mg via ORAL
  Filled 2024-06-12: qty 1

## 2024-06-12 MED ORDER — INSULIN ASPART 100 UNIT/ML IJ SOLN
3.0000 [IU] | Freq: Three times a day (TID) | INTRAMUSCULAR | Status: DC
  Administered 2024-06-13 – 2024-06-16 (×11): 3 [IU] via SUBCUTANEOUS
  Filled 2024-06-12 (×10): qty 1

## 2024-06-12 MED ORDER — METOPROLOL TARTRATE 50 MG PO TABS
50.0000 mg | ORAL_TABLET | Freq: Two times a day (BID) | ORAL | Status: DC
  Administered 2024-06-12 – 2024-06-16 (×8): 50 mg via ORAL
  Filled 2024-06-12 (×8): qty 1

## 2024-06-12 MED ORDER — INSULIN GLARGINE 100 UNIT/ML ~~LOC~~ SOLN
10.0000 [IU] | Freq: Every day | SUBCUTANEOUS | Status: DC
  Administered 2024-06-12 – 2024-06-15 (×4): 10 [IU] via SUBCUTANEOUS
  Filled 2024-06-12 (×5): qty 0.1

## 2024-06-12 NOTE — NC FL2 (Signed)
 [place]  MEDICAID [id number] LEVEL OF CARE FORM     IDENTIFICATION  Patient Name: Amber Avery Birthdate: 1946-08-14 Sex: female Admission Date (Current Location): 06/06/2024  [place] Number:  Chiropodist and Address:  [facility], [address], [place], [place] [address]      Provider Number: [id number]  Attending Physician Name and Address:  Franchot Novel, MD  Relative Name and Phone Number:  McPherson,Linda (Sister)  [phone]    Current Level of Care: Hospital Recommended Level of Care: Skilled Nursing Facility Prior Approval Number:    Date Approved/Denied:   PASRR Number: [id number]  Discharge Plan: SNF    Current Diagnoses: Patient Active Problem List   Diagnosis Date Noted   Atherosclerosis of aorta (HCC) 06/08/2024   Sepsis due to gram-negative UTI (HCC) 06/07/2024   Atrial fibrillation with rapid ventricular response (HCC) 06/07/2024   Controlled type 2 diabetes mellitus without complication, without long-term current use of insulin  (HCC) 06/07/2024   Gout 06/07/2024   Chronic pain syndrome 01/05/2024   B12 deficiency 01/05/2024   Secondary hyperparathyroidism of renal origin (HCC) 01/05/2024   Greater trochanteric bursitis, left 06/18/2022   Gait instability 12/13/2020   Stage 3a chronic kidney disease (HCC) 12/13/2020   Morbid obesity with BMI of 45.0-49.9, adult (HCC) 02/06/2020   History of gastritis 08/14/2019   Angiodysplasia of stomach and duodenum    Iron  deficiency anemia 04/20/2018   Lichen sclerosus of female genitalia 06/29/2016   Primary osteoarthritis of both knees 04/07/2016   Right thyroid  nodule 06/26/2015   Asthma, well controlled 04/03/2015   Benign essential HTN 04/03/2015   Carpal tunnel syndrome 04/03/2015   Chronic kidney disease (CKD), stage II (mild) 04/03/2015   Controlled gout 04/03/2015   Arteriosclerosis of coronary artery 04/03/2015   Diabetes mellitus with renal  manifestations, controlled (HCC) 04/03/2015   Dyslipidemia 04/03/2015   Bilateral edema of lower extremity 04/03/2015   Knee pain 04/03/2015   Lumbar radiculitis 04/03/2015   OSA on CPAP 04/03/2015   Lumbosacral spondylosis 04/03/2015   Osteoarthritis, chronic 04/03/2015   Psoriasis 04/03/2015   Vitamin D  deficiency 04/03/2015   Varicose veins 04/03/2015   Goiter 12/23/2014   Degeneration of intervertebral disc of lumbar region 08/23/2014    Orientation RESPIRATION BLADDER Height & Weight     Self, Time, Situation, Place  Normal Incontinent Weight: 105.6 kg Height:  5' 1 (154.9 cm)  BEHAVIORAL SYMPTOMS/MOOD NEUROLOGICAL BOWEL NUTRITION STATUS  Other (Comment) (None)   Continent Diet  AMBULATORY STATUS COMMUNICATION OF NEEDS Skin   Extensive Assist Verbally                         Personal Care Assistance Level of Assistance  Bathing, Feeding, Dressing Bathing Assistance: Limited assistance Feeding assistance: Independent Dressing Assistance: Limited assistance     Functional Limitations Info  Sight, Hearing, Speech Sight Info: Adequate (wears glasses) Hearing Info: Adequate Speech Info: Adequate    SPECIAL CARE FACTORS FREQUENCY  PT (By licensed PT), OT (By licensed OT)     PT Frequency: 5x week OT Frequency: 5x week            Contractures Contractures Info: Not present    Additional Factors Info  Code Status, Allergies Code Status Info: Full Allergies Info: Codeine- hives, nausea, vomitting           Current Medications (06/12/2024):  This is the current hospital active medication list Current Facility-Administered Medications  Medication  Dose Route Frequency Provider Last Rate Last Admin   acetaminophen  (TYLENOL ) tablet 650 mg  650 mg Oral Q6H PRN Mansy, Jan A, MD   650 mg at 06/08/24 0120   Or   acetaminophen  (TYLENOL ) suppository 650 mg  650 mg Rectal Q6H PRN Mansy, Jan A, MD       albuterol  (PROVENTIL ) (2.5 MG/3ML) 0.083% nebulizer  solution 2.5 mg  2.5 mg Nebulization Q6H PRN Dail Rankin RAMAN, RPH   2.5 mg at 06/10/24 9486   allopurinol  (ZYLOPRIM ) tablet 100 mg  100 mg Oral BID Franchot Novel, MD       atorvastatin  (LIPITOR) tablet 20 mg  20 mg Oral Daily Mansy, Jan A, MD   20 mg at 06/12/24 9091   cholecalciferol  (VITAMIN D3) tablet 1,000 Units  1,000 Units Oral Daily Mansy, Jan A, MD   1,000 Units at 06/12/24 9091   cyanocobalamin  (VITAMIN B12) tablet 500 mcg  500 mcg Oral Daily Mansy, Jan A, MD   500 mcg at 06/12/24 9082   diclofenac  Sodium (VOLTAREN ) 1 % topical gel 4 g  4 g Topical QID Franchot Novel, MD   4 g at 06/11/24 2136   [START ON 06/13/2024] diltiazem  (CARDIZEM  CD) 24 hr capsule 180 mg  180 mg Oral Daily Decoste, Gabriella, PA-C       diltiazem  (CARDIZEM ) tablet 60 mg  60 mg Oral Q6H Decoste, Gabriella, PA-C       insulin  aspart (novoLOG ) injection 0-15 Units  0-15 Units Subcutaneous TID WC Mansy, Madison LABOR, MD   5 Units at 06/12/24 9092   insulin  aspart (novoLOG ) injection 0-5 Units  0-5 Units Subcutaneous QHS Mansy, Jan A, MD       insulin  aspart (novoLOG ) injection 2 Units  2 Units Subcutaneous TID [facility] Franchot Novel, MD   2 Units at 06/12/24 9092   insulin  glargine (LANTUS ) injection 9 Units  9 Units Subcutaneous Q2200 Franchot Novel, MD   9 Units at 06/11/24 2134   lactulose  (CHRONULAC ) 10 GM/15ML solution 20 g  20 g Oral Daily PRN Franchot Novel, MD       loratadine  (CLARITIN ) tablet 10 mg  10 mg Oral Daily Franchot Novel, MD   10 mg at 06/12/24 9091   magnesium  hydroxide (MILK OF MAGNESIA) suspension 30 mL  30 mL Oral Daily PRN Franchot Novel, MD       metoprolol  tartrate (LOPRESSOR ) tablet 50 mg  50 mg Oral BID Decoste, Gabriella, PA-C       neomycin -bacitracin -polymyxin (NEOSPORIN) ointment   Topical Daily Franchot Novel, MD   Given at 06/12/24 1119   ondansetron  (ZOFRAN ) tablet 4 mg  4 mg Oral Q6H PRN Mansy, Jan A, MD       Or   ondansetron  (ZOFRAN ) injection 4 mg  4 mg Intravenous  Q6H PRN Mansy, Jan A, MD       polyethylene glycol (MIRALAX  / GLYCOLAX ) packet 17 g  17 g Oral Daily PRN Franchot Novel, MD       potassium chloride  SA (KLOR-CON  M) CR tablet 20 mEq  20 mEq Oral Daily Mansy, Jan A, MD   20 mEq at 06/12/24 0908   sodium chloride  (OCEAN) 0.65 % nasal spray 1 spray  1 spray Each Nare PRN Franchot Novel, MD   1 spray at 06/11/24 9060   traMADol  (ULTRAM ) tablet 25-50 mg  25-50 mg Oral BID PRN Mansy, Jan A, MD       traZODone  (DESYREL ) tablet 25 mg  25 mg Oral QHS PRN  Mansy, Jan A, MD   25 mg at 06/07/24 2240   warfarin (COUMADIN ) tablet 7.5 mg  7.5 mg Oral ONCE-1600 Patel, Kishan S, [facility]       Warfarin - Pharmacist Dosing Inpatient   Does not apply q1600 Nada Adriana BIRCH, [facility]   Given at 06/11/24 1747     Discharge Medications: Please see discharge summary for a list of discharge medications.  Relevant Imaging Results:  Relevant Lab Results:   Additional Information ss# [id number]  Racheal LITTIE Schimke, RN

## 2024-06-12 NOTE — TOC Progression Note (Signed)
 Transition of Care [facility]) - Progression Note    Patient Details  Name: Amber Avery MRN: [id number] Date of Birth: 1945-11-18  Transition of Care [facility]) CM/SW Contact  Racheal LITTIE Schimke, RN Phone Number: 06/12/2024, 1:14 PM  Clinical Narrative:  Spoke with patient and sister at bedside about SNF recommendation. Both agree and has no preference. Patient prefer to remain in the area. CM to refer to area SNF's and consult with patient and sister when bed offers are available.                      Expected Discharge Plan and Services                                               Social Drivers of Health (SDOH) Interventions SDOH Screenings   Food Insecurity: No Food Insecurity (06/08/2024)  Recent Concern: Food Insecurity - Food Insecurity Present (05/02/2024)  Housing: Low Risk  (06/08/2024)  Transportation Needs: No Transportation Needs (06/08/2024)  Utilities: Not At Risk (06/08/2024)  Alcohol  Screen: Low Risk  (05/25/2024)  Depression (PHQ2-9): Low Risk  (05/25/2024)  Financial Resource Strain: Low Risk  (05/25/2024)  Physical Activity: Insufficiently Active (05/25/2024)  Social Connections: Socially Isolated (06/08/2024)  Stress: No Stress Concern Present (05/25/2024)  Tobacco Use: Medium Risk (06/08/2024)  Health Literacy: Adequate Health Literacy (05/25/2024)    Readmission Risk Interventions     No data to display

## 2024-06-12 NOTE — Plan of Care (Signed)
   Problem: Fluid Volume: Goal: Ability to maintain a balanced intake and output will improve Outcome: Progressing

## 2024-06-12 NOTE — Progress Notes (Signed)
 [facility] CLINIC CARDIOLOGY PROGRESS NOTE       Patient ID: [name]: [id number] DOB/AGE: [birth year] 77 y.o.  Admit date: 06/06/2024 Referring Physician Dr. Madison Peaches Primary Physician Sowles, Krichna, MD  Primary Cardiologist Dr. Florencio (last seen 2016) Reason for Consultation AF RVR  HPI: Amber Avery is a 78 y.o. female  with a past medical history of hypertension, hyperlipidemia, type 2 diabetes, gout, obstructive sleep apnea who presented to the ED on 06/06/2024 for weakness and fatigue.  Noted to be in atrial fibrillation RVR in the ED.  Cardiology was consulted for further evaluation.   Interval History: -Patient seen and examined this AM and laying comfortably in hospital bed. Patient states she feels well this AM and denies chest pain, palpitations or SOB..  -Patients BP and HR stable this AM. Per tele remains in AF with controlled rates 60s.  -Patient remains on room air with stable SpO2.    Review of systems complete and found to be negative unless listed above    Past Medical History:  Diagnosis Date   Allergy    Arthritis    Asthma    Diabetes mellitus without complication (HCC)    Gout    Hyperlipidemia    Hypertension    Iron  deficiency anemia 04/20/2018   Oxygen  deficiency    PONV (postoperative nausea and vomiting)    after hemorroid surgery   Shortness of breath dyspnea    Sleep apnea     Past Surgical History:  Procedure Laterality Date   APPENDECTOMY     CATARACT EXTRACTION W/PHACO Left 08/14/2020   Procedure: CATARACT EXTRACTION PHACO AND INTRAOCULAR LENS PLACEMENT (IOC) LEFT DIABETIC 3.56 00:59.6 6.0%;  Surgeon: Mittie Gaskin, MD;  Location: [facility];  Service: Ophthalmology;  Laterality: Left;  Diabetic   CATARACT EXTRACTION W/PHACO Right 09/04/2020   Procedure: CATARACT EXTRACTION PHACO AND INTRAOCULAR LENS PLACEMENT (IOC) RIGHT DIABETIC;  Surgeon: Mittie Gaskin, MD;  Location: [facility];  Service:  Ophthalmology;  Laterality: Right;  4.72 1:03.2 7.4%   CHOLECYSTECTOMY  10/20/1975   COLONOSCOPY WITH PROPOFOL  N/A 01/21/2016   Procedure: COLONOSCOPY WITH PROPOFOL ;  Surgeon: Rogelia Copping, MD;  Location: [facility];  Service: Endoscopy;  Laterality: N/A;   DILATION AND CURETTAGE OF UTERUS     Due to Amenorrhea   ESOPHAGOGASTRODUODENOSCOPY (EGD) WITH PROPOFOL  N/A 08/16/2018   Procedure: ESOPHAGOGASTRODUODENOSCOPY (EGD) WITH PROPOFOL ;  Surgeon: Copping Rogelia, MD;  Location: [facility];  Service: Endoscopy;  Laterality: N/A;   EYE SURGERY     HEMORRHOID BANDING  10/19/1978   HEMORROIDECTOMY     HERNIA REPAIR  10/19/2009   Temporal Area Excision Biopsy     For Birth Mark Changes, Negative Pathology   TONSILLECTOMY AND ADENOIDECTOMY      Medications Prior to Admission  Medication Sig Dispense Refill Last Dose/Taking   acetaminophen  (TYLENOL ) 500 MG tablet Take 1 tablet (500 mg total) by mouth every 6 (six) hours as needed. (Patient taking differently: Take 1,000 mg by mouth 3 (three) times daily.) 480 tablet 0 06/06/2024 Evening   allopurinol  (ZYLOPRIM ) 100 MG tablet Take 1 tablet (100 mg total) by mouth 2 (two) times daily. 180 tablet 3 06/06/2024 Evening   atorvastatin  (LIPITOR) 20 MG tablet TAKE 1 TABLET BY MOUTH DAILY FOR CHOLESTEROL 90 tablet 3 06/06/2024 Morning   Blood Pressure KIT 1 each by Does not apply route daily. 1 kit 0 Taking   clobetasol  ointment (TEMOVATE ) 0.05 % Apply 1 Application topically at bedtime. Apply to  the affected area nightly for 4 weeks.   06/06/2024 Bedtime   dapagliflozin  propanediol (FARXIGA ) 10 MG TABS tablet Take 1 tablet (10 mg total) by mouth daily before breakfast. 90 tablet 3 06/06/2024 Morning   Dulaglutide  (TRULICITY ) 4.5 MG/0.5ML SOAJ Inject 4.5 mg as directed once a week. (Patient taking differently: Inject 5 mg as directed once a week.) 8 mL 2 06/01/2024   furosemide  (LASIX ) 20 MG tablet Take 1 tablet (20 mg total) by mouth 2 (two) times daily.  Second dose around noon 180 tablet 1 06/07/2024 Morning   gabapentin  (NEURONTIN ) 300 MG capsule TAKE 1 CAPSULE BY MOUTH IN THE MORNING, 1 CAPSULE IN THE EVENING AND 2 CAPSULES AT NIGHT 360 capsule 3 06/06/2024 Bedtime   glucose blood (ONETOUCH VERIO) test strip USE AS DIRECTED 50 each 1 Taking   Lancets (ONETOUCH ULTRASOFT) lancets Use as instructed 100 each 12 Taking   metFORMIN  (GLUCOPHAGE -XR) 750 MG 24 hr tablet Take 1 tablet (750 mg total) by mouth daily with breakfast. 90 tablet 1 06/06/2024 Morning   methocarbamol  (ROBAXIN ) 500 MG tablet Take 500 mg by mouth 3 (three) times daily.   06/06/2024 Bedtime   potassium chloride  SA (KLOR-CON  M) 20 MEQ tablet TAKE 1 TABLET BY MOUTH DAILY 90 tablet 1 06/06/2024 Morning   traMADol  (ULTRAM ) 50 MG tablet Take 25-50 mg by mouth 2 (two) times daily as needed.   06/06/2024 Evening   triamcinolone  ointment (KENALOG ) 0.1 % APPLY TOPICALLY TWICE DAILY AS NEEDED. GENERIC EQUIVALENT FOR KENALOG  90 g 0 Unknown   vitamin B-12 (CYANOCOBALAMIN ) 500 MCG tablet Take 500 mcg by mouth daily.   06/06/2024 Morning   Vitamin D , Cholecalciferol , 25 MCG (1000 UT) TABS Take 1 capsule by mouth daily. 90 tablet 1 06/06/2024 Morning   VOLTAREN  1 % GEL Apply 4 g topically 4 (four) times daily. 300 g 3 06/06/2024 Evening   albuterol  (VENTOLIN  HFA) 108 (90 Base) MCG/ACT inhaler Inhale 2 puffs into the lungs every 6 (six) hours as needed for wheezing or shortness of breath. 8 g 5 Unknown   Social History   Socioeconomic History   Marital status: Divorced    Spouse name: Not on file   Number of children: 0   Years of education: Not on file   Highest education level: GED or equivalent  Occupational History   Occupation: Retired  Tobacco Use   Smoking status: Former    Current packs/day: 0.00    Average packs/day: 2.0 packs/day for 15.0 years (30.0 ttl pk-yrs)    Types: Cigarettes    Start date: 27    Quit date: 1983    Years since quitting: 42.6   Smokeless tobacco: Never    Tobacco comments:    smoking cessation materials not required  Vaping Use   Vaping status: Never Used  Substance and Sexual Activity   Alcohol  use: Not Currently    Alcohol /week: 0.0 standard drinks of alcohol    Drug use: No   Sexual activity: Not Currently  Other Topics Concern   Not on file  Social History Narrative   Patient just moved in with her youngest sister and a roommate    Social Drivers of Corporate investment banker Strain: Low Risk  (05/25/2024)   Overall Financial Resource Strain (CARDIA)    Difficulty of Paying Living Expenses: Not very hard  Food Insecurity: No Food Insecurity (06/08/2024)   Hunger Vital Sign    Worried About Running Out of Food in the Last Year: Never true  Ran Out of Food in the Last Year: Never true  Recent Concern: Food Insecurity - Food Insecurity Present (05/02/2024)   Hunger Vital Sign    Worried About Running Out of Food in the Last Year: Never true    Ran Out of Food in the Last Year: Sometimes true  Transportation Needs: No Transportation Needs (06/08/2024)   PRAPARE - Administrator, Civil Service (Medical): No    Lack of Transportation (Non-Medical): No  Physical Activity: Insufficiently Active (05/25/2024)   Exercise Vital Sign    Days of Exercise per Week: 3 days    Minutes of Exercise per Session: 10 min  Stress: No Stress Concern Present (05/25/2024)   [employer] of Occupational Health - Occupational Stress Questionnaire    Feeling of Stress: Not at all  Social Connections: Socially Isolated (06/08/2024)   Social Connection and Isolation Panel    Frequency of Communication with Friends and Family: Once a week    Frequency of Social Gatherings with Friends and Family: Once a week    Attends Religious Services: Never    Database administrator or Organizations: No    Attends Banker Meetings: Never    Marital Status: Divorced  Catering manager Violence: Not At Risk (06/08/2024)   Humiliation, Afraid,  Rape, and Kick questionnaire    Fear of Current or Ex-Partner: No    Emotionally Abused: No    Physically Abused: No    Sexually Abused: No    Family History  Problem Relation Age of Onset   Cancer Mother        brain tumor   Kidney disease Mother    Hypertension Mother    Heart disease Father    COPD Father    Hearing loss Father    Hypertension Sister    Arthritis Sister    Cancer Sister    Heart murmur Sister    GER disease Sister    Lupus Sister    Diabetes Sister    Arthritis Sister    Osteopenia Sister    Heart disease Sister    Hypertension Sister    Hearing loss Sister    Obesity Sister    Breast cancer Maternal Aunt 61   Leukemia Maternal Aunt      Vitals:   06/11/24 2132 06/12/24 0031 06/12/24 0033 06/12/24 0522  BP: 132/78 120/78 120/78 136/67  Pulse: 74  70 65  Resp: 18  20 20   Temp: 98.3 F (36.8 C)  98.1 F (36.7 C) (!) 97.5 F (36.4 C)  TempSrc:      SpO2: 96%  96% 95%  Weight:    105.6 kg  Height:        PHYSICAL EXAM General: Ill-appearing female, well nourished, in no acute distress. HEENT: Normocephalic and atraumatic. Neck: No JVD.  Lungs: Normal respiratory effort. Clear bilaterally to auscultation. No wheezes, crackles, rhonchi.  Heart: Irregularly irregular, controlled rates. Normal S1 and S2 without gallops or murmurs.  Abdomen: Non-distended appearing.  Msk: Normal strength and tone for age. Extremities: Warm and well perfused. No clubbing, cyanosis.  No edema.  Neuro: Alert and oriented X 3. Psych: Answers questions appropriately.   Labs: Basic Metabolic Panel: Recent Labs    06/10/24 0532 06/11/24 0636  NA 138 141  K 4.1 4.3  CL 109 112*  CO2 23 21*  GLUCOSE 171* 162*  BUN 50* 46*  CREATININE 1.20* 0.98  CALCIUM  8.9 9.1  MG 2.5* 2.4   Liver Function  Tests: No results for input(s): AST, ALT, ALKPHOS, BILITOT, PROT, ALBUMIN in the last 72 hours.  No results for input(s): LIPASE, AMYLASE in the last  72 hours. CBC: Recent Labs    06/12/24 0539  WBC 10.1  HGB 13.4  HCT 41.5  MCV 94.5  PLT 361   Cardiac Enzymes: No results for input(s): CKTOTAL, CKMB, CKMBINDEX, TROPONINIHS in the last 72 hours.  BNP: No results for input(s): BNP in the last 72 hours. D-Dimer: No results for input(s): DDIMER in the last 72 hours.  Hemoglobin A1C: No results for input(s): HGBA1C in the last 72 hours. Fasting Lipid Panel: No results for input(s): CHOL, HDL, LDLCALC, TRIG, CHOLHDL, LDLDIRECT in the last 72 hours. Thyroid  Function Tests: No results for input(s): TSH, T4TOTAL, T3FREE, THYROIDAB in the last 72 hours.  Invalid input(s): FREET3  Anemia Panel: No results for input(s): VITAMINB12, FOLATE, FERRITIN, TIBC, IRON , RETICCTPCT in the last 72 hours.   Radiology: ECHOCARDIOGRAM COMPLETE Result Date: 06/07/2024    ECHOCARDIOGRAM REPORT   Patient Name:   SISSI PADIA Date of Exam: 06/07/2024 Medical Rec #:  [id number]       Height:       61.0 in Accession #:    [id number]      Weight:       247.8 lb Date of Birth:  1946-07-24       BSA:          2.069 m Patient Age:    77 years        BP:           137/103 mmHg Patient Gender: F               HR:           109 bpm. Exam Location:  [facility] Procedure: 2D Echo, Color Doppler and Cardiac Doppler (Both Spectral and Color            Flow Doppler were utilized during procedure). Indications:     Atrial Fibrillation I48.91  History:         Patient has no prior history of Echocardiogram examinations.                  Risk Factors:Hypertension, Diabetes and Sleep Apnea.  Sonographer:     Christopher Furnace Referring Phys:  [id number] JAN A MANSY Diagnosing Phys: Dwayne D Callwood MD  Sonographer Comments: Technically challenging study due to limited acoustic windows. Image acquisition challenging due to patient body habitus. IMPRESSIONS  1. Left ventricular ejection fraction, by estimation, is 50 to 55%. The left ventricle has  low normal function. The left ventricle has no regional wall motion abnormalities. Left ventricular diastolic function could not be evaluated.  2. Right ventricular systolic function is low normal. The right ventricular size is mildly enlarged.  3. The mitral valve is normal in structure. Trivial mitral valve regurgitation.  4. The aortic valve is normal in structure. Aortic valve regurgitation is not visualized. FINDINGS  Left Ventricle: Left ventricular ejection fraction, by estimation, is 50 to 55%. The left ventricle has low normal function. The left ventricle has no regional wall motion abnormalities. Strain was performed and the global longitudinal strain is indeterminate. The left ventricular internal cavity size was normal in size. There is borderline left ventricular hypertrophy. Left ventricular diastolic function could not be evaluated. Right Ventricle: The right ventricular size is mildly enlarged. No increase in right ventricular wall thickness. Right ventricular systolic function is low normal. Left Atrium:  Left atrial size was normal in size. Right Atrium: Right atrial size was normal in size. Pericardium: There is no evidence of pericardial effusion. Mitral Valve: The mitral valve is normal in structure. Trivial mitral valve regurgitation. Tricuspid Valve: The tricuspid valve is normal in structure. Tricuspid valve regurgitation is mild. Aortic Valve: The aortic valve is normal in structure. Aortic valve regurgitation is not visualized. Aortic valve mean gradient measures 7.0 mmHg. Aortic valve peak gradient measures 12.5 mmHg. Aortic valve area, by VTI measures 2.24 cm. Pulmonic Valve: The pulmonic valve was normal in structure. Pulmonic valve regurgitation is not visualized. Aorta: The ascending aorta was not well visualized. IAS/Shunts: No atrial level shunt detected by color flow Doppler. Additional Comments: 3D was performed not requiring image post processing on an independent workstation and  was indeterminate.  LEFT VENTRICLE PLAX 2D LVIDd:         4.20 cm LVIDs:         3.10 cm LV PW:         1.20 cm LV IVS:        0.90 cm LVOT diam:     2.00 cm LV SV:         51 LV SV Index:   25 LVOT Area:     3.14 cm  RIGHT VENTRICLE RV Basal diam:  3.50 cm RV Mid diam:    2.90 cm RV S prime:     11.70 cm/s TAPSE (M-mode): 2.1 cm LEFT ATRIUM             Index        RIGHT ATRIUM           Index LA diam:        3.60 cm 1.74 cm/m   RA Area:     15.10 cm LA Vol (A2C):   62.0 ml 29.97 ml/m  RA Volume:   37.70 ml  18.22 ml/m LA Vol (A4C):   70.8 ml 34.22 ml/m LA Biplane Vol: 66.3 ml 32.05 ml/m  AORTIC VALVE AV Area (Vmax):    2.15 cm AV Area (Vmean):   2.20 cm AV Area (VTI):     2.24 cm AV Vmax:           177.00 cm/s AV Vmean:          122.000 cm/s AV VTI:            0.229 m AV Peak Grad:      12.5 mmHg AV Mean Grad:      7.0 mmHg LVOT Vmax:         121.00 cm/s LVOT Vmean:        85.500 cm/s LVOT VTI:          0.163 m LVOT/AV VTI ratio: 0.71  AORTA Ao Root diam: 2.70 cm MITRAL VALVE                TRICUSPID VALVE MV Area (PHT): 4.41 cm     TR Peak grad:   19.0 mmHg MV Decel Time: 172 msec     TR Vmax:        218.00 cm/s MV E velocity: 138.00 cm/s                             SHUNTS                             Systemic VTI:  0.16 m                             Systemic Diam: 2.00 cm Cara JONETTA Lovelace MD Electronically signed by Cara JONETTA Lovelace MD Signature Date/Time: 06/07/2024/5:10:06 PM    Final    CT Angio Chest PE W and/or Wo Contrast Result Date: 06/07/2024 CLINICAL DATA:  Pulmonary embolism (PE) suspected, low to intermediate prob, positive D-dimer. Suspected sepsis. EXAM: CT ANGIOGRAPHY CHEST WITH CONTRAST TECHNIQUE: Multidetector CT imaging of the chest was performed using the standard protocol during bolus administration of intravenous contrast. Multiplanar CT image reconstructions and MIPs were obtained to evaluate the vascular anatomy. RADIATION DOSE REDUCTION: This exam was performed according to  the departmental dose-optimization program which includes automated exposure control, adjustment of the mA and/or kV according to patient size and/or use of iterative reconstruction technique. CONTRAST:  75mL OMNIPAQUE  IOHEXOL  350 MG/ML SOLN COMPARISON:  12/23/2014 FINDINGS: Cardiovascular: No filling defects in the pulmonary arteries to suggest pulmonary emboli. Heart is normal size. Aorta is normal caliber. Scattered coronary artery and aortic calcifications. Mediastinum/Nodes: No mediastinal, hilar, or axillary adenopathy. Trachea and esophagus are unremarkable. Multiple calcified nodules in the thyroid  are unchanged since prior study. Lungs/Pleura: No confluent airspace opacities or effusions. Upper Abdomen: No acute findings Musculoskeletal: Chest wall soft tissues are unremarkable. No acute bony abnormality. Review of the MIP images confirms the above findings. IMPRESSION: No evidence of pulmonary embolus. Coronary artery disease. No acute cardiopulmonary disease. Aortic Atherosclerosis (ICD10-I70.0). Electronically Signed   By: Franky Crease M.D.   On: 06/07/2024 01:01   CT ABDOMEN PELVIS W CONTRAST Result Date: 06/07/2024 CLINICAL DATA:  Sepsis EXAM: CT ABDOMEN AND PELVIS WITH CONTRAST TECHNIQUE: Multidetector CT imaging of the abdomen and pelvis was performed using the standard protocol following bolus administration of intravenous contrast. RADIATION DOSE REDUCTION: This exam was performed according to the departmental dose-optimization program which includes automated exposure control, adjustment of the mA and/or kV according to patient size and/or use of iterative reconstruction technique. CONTRAST:  75mL OMNIPAQUE  IOHEXOL  350 MG/ML SOLN COMPARISON:  None Available. FINDINGS: Lower chest: No acute abnormality Hepatobiliary: No focal liver abnormality is seen. Status post cholecystectomy. No biliary dilatation. Pancreas: No focal abnormality or ductal dilatation. Spleen: No focal abnormality.  Normal  size. Adrenals/Urinary Tract: Adrenal glands normal. No renal mass, stone or hydronephrosis. Nonspecific bilateral perinephric stranding. Urinary bladder decompressed, grossly unremarkable. Stomach/Bowel: Left colonic diverticulosis. No active diverticulitis. Stomach and small bowel decompressed. No bowel obstruction or inflammatory process. Vascular/Lymphatic: No evidence of aneurysm or adenopathy. Reproductive: 6 cm partially calcified left uterine fibroid. 2.8 cm cyst in the left ovary. Right ovary unremarkable. Other: No free fluid or free air. Musculoskeletal: No acute bony abnormality. IMPRESSION: No acute findings in the abdomen or pelvis. Nonspecific bilateral perinephric stranding which may be related to remote insult. No hydronephrosis or stones. Aortic atherosclerosis. Left colonic diverticulosis. Electronically Signed   By: Franky Crease M.D.   On: 06/07/2024 00:59   CT HEAD WO CONTRAST ( ) Result Date: 06/06/2024 CLINICAL DATA:  Headache, new onset (Age >= 51y) generalized weakness and headache EXAM: CT HEAD WITHOUT CONTRAST TECHNIQUE: Contiguous axial images were obtained from the base of the skull through the vertex without intravenous contrast. RADIATION DOSE REDUCTION: This exam was performed according to the departmental dose-optimization program which includes automated exposure control, adjustment of the mA and/or kV according to patient size and/or use of iterative reconstruction technique. COMPARISON:  CT head 12/23/2014 FINDINGS: Brain: No evidence of large-territorial acute infarction. No parenchymal hemorrhage. No mass lesion. No extra-axial collection. No mass effect or midline shift. No hydrocephalus. Basilar cisterns are patent. Vascular: No hyperdense vessel. Atherosclerotic calcifications are present within the cavernous internal carotid arteries. Skull: No acute fracture or focal lesion. Sinuses/Orbits: Paranasal sinuses and mastoid air cells are clear. Bilateral lens replacement.  Otherwise the orbits are unremarkable. Other: None. IMPRESSION: No acute intracranial abnormality. Electronically Signed   By: Morgane  Naveau M.D.   On: 06/06/2024 23:35   DG Chest Port 1 View if patient is in a treatment room. Result Date: 06/06/2024 CLINICAL DATA:  Suspected sepsis, former smoker, asthma EXAM: PORTABLE CHEST 1 VIEW COMPARISON:  12/23/2014 FINDINGS: Stable cardiomediastinal silhouette. Aortic atherosclerotic calcification. Chronic interstitial coarsening. No focal consolidation, pleural effusion, or pneumothorax. No displaced rib fractures. IMPRESSION: Chronic interstitial coarsening.  No acute abnormality. Electronically Signed   By: Norman Gatlin M.D.   On: 06/06/2024 23:20    ECHO as above  TELEMETRY reviewed by me 06/12/2024: Atrial fibrillation 60s   EKG reviewed by me: Atrial fibrillation RVR rate 136 bpm  Data reviewed by me 06/12/2024: last 24h vitals tele labs imaging I/O hospitalist progress notes.  Principal Problem:   Sepsis due to gram-negative UTI [facility]) Active Problems:   Dyslipidemia   Atrial fibrillation with rapid ventricular response (HCC)   Controlled type 2 diabetes mellitus without complication, without long-term current use of insulin  (HCC)   Gout    ASSESSMENT AND PLAN:  Amber Avery is a 78 y.o. female  with a past medical history of hypertension, hyperlipidemia, type 2 diabetes, gout, obstructive sleep apnea who presented to the ED on 06/06/2024 for weakness and fatigue.  Noted to be in atrial fibrillation RVR in the ED.  Cardiology was consulted for further evaluation.   # Atrial fibrillation RVR # New onset atrial fibrillation  # Sepsis 2/2 UTI # Hypertension # Hyperlipidemia Patient presented with worsening weakness, found to have UTI on UA.  Also noted to be in atrial fibrillation RVR on EKG.  Started on IV diltiazem  in the ED. Per tele remains in AF with controlled rates. - Consolidate metoprolol  tartrate to 50 mg BID - Consolidate  Cardizem  to 180 mg daily. - Continue Warfrain per pharmacy (Warfarin over Eliquis  due to cost burden) - Continue atorvastatin  20 mg daily. - Minimally elevated and flat trending troponin most consistent with demand/supply mismatch and not ACS    This patient's plan of care was discussed and created with Dr. Wilburn and he is in agreement.  Signed: Dorene Comfort, PA-C  06/12/2024, 7:40 AM [facility] Cardiology

## 2024-06-12 NOTE — Progress Notes (Signed)
 Progress Note   Patient: Amber Avery FMW:[id number] DOB: 1946-08-15 DOA: 06/06/2024     5 DOS: the patient was seen and examined on 06/12/2024   Brief hospital course: Amber Avery is a 78 y.o. obese female with medical history significant for asthma, CKD3b, type diabetes mellitus, gout, hypertension, dyslipidemia and OSA, who presented to the emergency room with acute onset of malaise and feeling sick since Tuesday with diaphoresis.  She had chills on Saturday.  She admits to urinary frequency and urgency without dysuria or hematuria or flank pain.  No chest pain or palpitations.  No dyspnea or cough or wheezing.  No nausea or vomiting or abdominal pain.  She denies any history of atrial fibrillation.    Assessment and Plan: * Sepsis due to gram-negative UTI (HCC) Sepsis physiology present on arrival, now resolved Urine cultures growing Klebsiella pneumoniae, Status post 3 doses of ceftriaxone  and 2 days of cefadroxil . No laboratory or clinical evidence of infection.  -Continue cefadroxil  for 2 more days of antibiotics   Atrial fibrillation with rapid ventricular response RVR resolved  In the setting of above  Rate controlled,  - CHA2DS2-VASc score is 5. TTE with EF 50 to 55%, RV systolic function low normal, RV size mildly enlarged, no significant valvular abnormality Per cardiology  Coumadin , dilt 180 mg daily, metop tartrate 50mg  bid. INR 1.9 this AM she will have follow up with cardiology clinic for INR checks    Controlled type 2 diabetes mellitus without complication, without long-term current use of insulin  (HCC) A1c 7.3  - SSI CBG (last 3)  Recent Labs    06/12/24 0841 06/12/24 1209 06/12/24 1642  GLUCAP 207* 103* 150*   -Holding Farxiga  and metformin . Basal bolus insulin  regimen while inpatient. Increase to 10 u LA and 3u TID wc   Dyslipidemia - Will continue statin therapy.  Gout Resumed home dose.   CKD 3b BL serum creatinine appears to be ~1.32  at  baseline this AM.     Latest Ref Rng & Units 06/11/2024    6:36 AM 06/10/2024    5:32 AM 06/09/2024    6:06 AM  BMP  Glucose 70 - 99 mg/dL 837  828  837   BUN 8 - 23 mg/dL 46  50  55   Creatinine 0.44 - 1.00 mg/dL 9.01  8.79  8.48   Sodium 135 - 145 mmol/L 141  138  135   Potassium 3.5 - 5.1 mmol/L 4.3  4.1  3.8   Chloride 98 - 111 mmol/L 112  109  104   CO2 22 - 32 mmol/L 21  23  22    Calcium  8.9 - 10.3 mg/dL 9.1  8.9  8.5   Avoid nephrotoxic agents  Will continue to monitor  Nephrology referral sent   OSA  On CPAP.   BMI:  47.63 kg/m Obesity class III  Complicates care and overall prognosis.      Subjective: Feeling better.   Physical Exam: Vitals:   06/12/24 0522 06/12/24 0846 06/12/24 1144 06/12/24 1516  BP: 136/67 (!) 118/59 124/87 127/71  Pulse: 65 78 90 79  Resp: 20 20 20 20   Temp: (!) 97.5 F (36.4 C) 97.7 F (36.5 C) 98 F (36.7 C) 98.2 F (36.8 C)  TempSrc:      SpO2: 95% 96% 96% 96%  Weight: 105.6 kg     Height:          Constitutional: In no distress.  Cardiovascular: Irregularly irregular, trce bilateral  lower extremity edema  Pulmonary: Non labored breathing on room air, no wheezing or rales.   Abdominal: Soft. Non distended and non tender Musculoskeletal: Normal range of motion.     Neurological: Alert and oriented to person, place, and time. Non focal  Skin: Skin is warm and dry.     Data Reviewed:     Latest Ref Rng & Units 06/12/2024    5:39 AM 06/08/2024    5:41 AM 06/06/2024   10:59 PM  CBC  WBC 4.0 - 10.5 K/uL 10.1  12.6  10.7   Hemoglobin 12.0 - 15.0 g/dL 86.5  87.3  85.8   Hematocrit 36.0 - 46.0 % 41.5  37.8  42.5   Platelets 150 - 400 K/uL 361  151  142       Latest Ref Rng & Units 06/11/2024    6:36 AM 06/10/2024    5:32 AM 06/09/2024    6:06 AM  BMP  Glucose 70 - 99 mg/dL 837  828  837   BUN 8 - 23 mg/dL 46  50  55   Creatinine 0.44 - 1.00 mg/dL 9.01  8.79  8.48   Sodium 135 - 145 mmol/L 141  138  135   Potassium 3.5  - 5.1 mmol/L 4.3  4.1  3.8   Chloride 98 - 111 mmol/L 112  109  104   CO2 22 - 32 mmol/L 21  23  22    Calcium  8.9 - 10.3 mg/dL 9.1  8.9  8.5       Family Communication: None at bedside. Patient noted understanding of plan.   Disposition: Status is: Inpatient Remains inpatient for further monitoring of her A-fib off of diltiazem  infusion.  As well as initiation of Coumadin .  Can likely discharge in the next day or so  Planned Discharge Destination: Pending     Time spent: 35 minutes  Author: Alban Pepper, MD 06/12/2024 7:53 PM  For on call review [email or web address].

## 2024-06-12 NOTE — Progress Notes (Addendum)
 PHARMACY - ANTICOAGULATION CONSULT NOTE  Pharmacy Consult for warfarin Indication: atrial fibrillation  Allergies  Allergen Reactions   Codeine Hives and Nausea And Vomiting    Patient Measurements: Height: 5' 1 (154.9 cm) Weight: 105.6 kg (232 lb 14.4 oz) IBW/kg (Calculated) : 47.8  Vital Signs: Temp: 97.7 F (36.5 C) (08/25 0846) BP: 118/59 (08/25 0846) Pulse Rate: 78 (08/25 0846)  Labs: Recent Labs    06/10/24 0532 06/11/24 0636 06/12/24 0539  HGB  --   --  13.4  HCT  --   --  41.5  PLT  --   --  361  LABPROT 17.7* 18.0* 22.6*  INR 1.4* 1.4* 1.9*  CREATININE 1.20* 0.98  --     Estimated Creatinine Clearance: 54.7 mL/min (by C-G formula based on SCr of 0.98 mg/dL).   Medical History: Past Medical History:  Diagnosis Date   Allergy    Arthritis    Asthma    Diabetes mellitus without complication (HCC)    Gout    Hyperlipidemia    Hypertension    Iron  deficiency anemia 04/20/2018   Oxygen  deficiency    PONV (postoperative nausea and vomiting)    after hemorroid surgery   Shortness of breath dyspnea    Sleep apnea     Medications:  Scheduled:   allopurinol   50 mg Oral Daily   atorvastatin   20 mg Oral Daily   cholecalciferol   1,000 Units Oral Daily   cyanocobalamin   500 mcg Oral Daily   diclofenac  Sodium  4 g Topical QID   [START ON 06/13/2024] diltiazem   180 mg Oral Daily   diltiazem   60 mg Oral Q6H   insulin  aspart  0-15 Units Subcutaneous TID WC   insulin  aspart  0-5 Units Subcutaneous QHS   insulin  aspart  2 Units Subcutaneous TID WC   insulin  glargine  9 Units Subcutaneous Q2200   loratadine   10 mg Oral Daily   metoprolol  tartrate  50 mg Oral BID   neomycin -bacitracin -polymyxin   Topical Daily   potassium chloride  SA  20 mEq Oral Daily   Warfarin - Pharmacist Dosing Inpatient   Does not apply q1600   Infusions:    PRN: acetaminophen  **OR** acetaminophen , albuterol , lactulose , magnesium  hydroxide, ondansetron  **OR** ondansetron  (ZOFRAN )  IV, polyethylene glycol, sodium chloride , traMADol , traZODone   Assessment: 78 y.o. female with medical history significant for asthma, CKD3b, type diabetes mellitus, gout, hypertension, dyslipidemia and OSA, who presented to the emergency room with sepsis due to gram-negative UTI. She was started on Eliquis  but is concerned about the affordability. CHADSVASc 5. No Major DDI, pt completed abx.   Goal of Therapy:  INR 2-3 Monitor platelets by anticoagulation protocol: Yes  Date:  INR: Dose: 8/23 1.4 7.5 mg 8/24 1.4 7.5 mg  8/25 1.9 7.5 mg   Plan:  INR is slightly subtherapeutic, but trending up. Will continue with warfarin 7.5 mg x 1 tonight. Daily INR. CBC at least every 3 days. No need to bridge.   Amber Avery, PharmD Clinical Pharmacist 06/12/2024 8:48 AM

## 2024-06-13 DIAGNOSIS — I4891 Unspecified atrial fibrillation: Secondary | ICD-10-CM | POA: Diagnosis not present

## 2024-06-13 LAB — BASIC METABOLIC PANEL WITH GFR
Anion gap: 6 (ref 5–15)
BUN: 33 mg/dL — ABNORMAL HIGH (ref 8–23)
CO2: 25 mmol/L (ref 22–32)
Calcium: 9.4 mg/dL (ref 8.9–10.3)
Chloride: 115 mmol/L — ABNORMAL HIGH (ref 98–111)
Creatinine, Ser: 0.9 mg/dL (ref 0.44–1.00)
GFR, Estimated: >60 mL/min
Glucose, Bld: 154 mg/dL — ABNORMAL HIGH (ref 70–99)
Potassium: 4.3 mmol/L (ref 3.5–5.1)
Sodium: 146 mmol/L — ABNORMAL HIGH (ref 135–145)

## 2024-06-13 LAB — GLUCOSE, CAPILLARY
Glucose-Capillary: 156 mg/dL — ABNORMAL HIGH (ref 70–99)
Glucose-Capillary: 136 mg/dL — ABNORMAL HIGH (ref 70–99)
Glucose-Capillary: 155 mg/dL — ABNORMAL HIGH (ref 70–99)
Glucose-Capillary: 143 mg/dL — ABNORMAL HIGH (ref 70–99)

## 2024-06-13 LAB — PROTIME-INR
INR: 2.5 — ABNORMAL HIGH (ref 0.8–1.2)
Prothrombin Time: 27.8 s — ABNORMAL HIGH (ref 11.4–15.2)

## 2024-06-13 LAB — MAGNESIUM: Magnesium: 1.9 mg/dL (ref 1.7–2.4)

## 2024-06-13 MED ORDER — WARFARIN SODIUM 4 MG PO TABS
4.0000 mg | ORAL_TABLET | Freq: Once | ORAL | Status: AC
  Administered 2024-06-13: 4 mg via ORAL
  Filled 2024-06-13: qty 1

## 2024-06-13 MED ORDER — DILTIAZEM HCL ER COATED BEADS 120 MG PO CP24
240.0000 mg | ORAL_CAPSULE | Freq: Every day | ORAL | Status: DC
  Administered 2024-06-14 – 2024-06-16 (×3): 240 mg via ORAL
  Filled 2024-06-13 (×3): qty 2

## 2024-06-13 NOTE — Progress Notes (Signed)
 PHARMACY - ANTICOAGULATION CONSULT NOTE  Pharmacy Consult for warfarin Indication: atrial fibrillation  Allergies  Allergen Reactions   Codeine Hives and Nausea And Vomiting    Patient Measurements: Height: 5' 1 (154.9 cm) Weight: 105.6 kg (232 lb 14.4 oz) IBW/kg (Calculated) : 47.8  Vital Signs: Temp: 98 F (36.7 C) (08/26 0300) Temp Source: Oral (08/26 0300) BP: 120/68 (08/26 0300) Pulse Rate: 75 (08/26 0300)  Labs: Recent Labs    06/11/24 0636 06/12/24 0539 06/13/24 0435  HGB  --  13.4  --   HCT  --  41.5  --   PLT  --  361  --   LABPROT 18.0* 22.6* 27.8*  INR 1.4* 1.9* 2.5*  CREATININE 0.98  --  0.90    Estimated Creatinine Clearance: 59.5 mL/min (by C-G formula based on SCr of 0.9 mg/dL).   Medical History: Past Medical History:  Diagnosis Date   Allergy    Arthritis    Asthma    Diabetes mellitus without complication (HCC)    Gout    Hyperlipidemia    Hypertension    Iron  deficiency anemia 04/20/2018   Oxygen  deficiency    PONV (postoperative nausea and vomiting)    after hemorroid surgery   Shortness of breath dyspnea    Sleep apnea     Medications:  Scheduled:   allopurinol   100 mg Oral BID   atorvastatin   20 mg Oral Daily   cholecalciferol   1,000 Units Oral Daily   cyanocobalamin   500 mcg Oral Daily   diclofenac  Sodium  4 g Topical QID   diltiazem   180 mg Oral Daily   insulin  aspart  0-15 Units Subcutaneous TID WC   insulin  aspart  0-5 Units Subcutaneous QHS   insulin  aspart  3 Units Subcutaneous TID WC   insulin  glargine  10 Units Subcutaneous Q2200   loratadine   10 mg Oral Daily   metoprolol  tartrate  50 mg Oral BID   neomycin -bacitracin -polymyxin   Topical Daily   potassium chloride  SA  20 mEq Oral Daily   Warfarin - Pharmacist Dosing Inpatient   Does not apply q1600   Assessment: 78 y.o. female with medical history significant for asthma, CKD3b, type diabetes mellitus, gout, hypertension, dyslipidemia and OSA, who presented to  the emergency room with sepsis due to gram-negative UTI. She was started on Eliquis  but is concerned about the affordability. CHADSVASc 5. No Major DDI, pt completed abx.   Goal of Therapy:  INR 2-3 Monitor platelets by anticoagulation protocol: Yes  Date:  INR: Dose: 8/23 1.4 7.5 mg 8/24 1.4 7.5 mg  8/25 1.9 7.5 mg  8/26 2.5 4 mg  Plan:  INR therapeutic today after receiving 22.5mg  in 3 days, Hgb and plt remain stable. Will decrease warfarin dose to 4mg  today. Expected need ~ 5mg  daily. Continue daily INR with AM labs. CBC at least every 3 days.  Hakan Nudelman Rodriguez-Guzman PharmD, BCPS 06/13/2024 7:17 AM

## 2024-06-13 NOTE — Progress Notes (Signed)
 [facility] CLINIC CARDIOLOGY PROGRESS NOTE       Patient ID: Amber Avery MRN: [id number] DOB/AGE: 1946/02/06 77 y.o.  Admit date: 06/06/2024 Referring Physician Dr. Madison Peaches Primary Physician Sowles, Krichna, MD  Primary Cardiologist Dr. Florencio (last seen 2016) Reason for Consultation AF RVR  HPI: Amber Avery is a 78 y.o. female  with a past medical history of hypertension, hyperlipidemia, type 2 diabetes, gout, obstructive sleep apnea who presented to the ED on 06/06/2024 for weakness and fatigue.  Noted to be in atrial fibrillation RVR in the ED.  Cardiology was consulted for further evaluation.   Interval History: -Patient seen and examined this AM and laying comfortably in hospital bed. Patient states she feels well this AM and denies chest pain, palpitations or SOB..  -Patients BP and HR stable this AM. Per tele remains in AF rate 80-90s this AM  -Patient remains on room air with stable SpO2.  -Ambulated today with no concerns.   Review of systems complete and found to be negative unless listed above    Past Medical History:  Diagnosis Date   Allergy    Arthritis    Asthma    Diabetes mellitus without complication (HCC)    Gout    Hyperlipidemia    Hypertension    Iron  deficiency anemia 04/20/2018   Oxygen  deficiency    PONV (postoperative nausea and vomiting)    after hemorroid surgery   Shortness of breath dyspnea    Sleep apnea     Past Surgical History:  Procedure Laterality Date   APPENDECTOMY     CATARACT EXTRACTION W/PHACO Left 08/14/2020   Procedure: CATARACT EXTRACTION PHACO AND INTRAOCULAR LENS PLACEMENT (IOC) LEFT DIABETIC 3.56 00:59.6 6.0%;  Surgeon: Mittie Gaskin, MD;  Location: [facility];  Service: Ophthalmology;  Laterality: Left;  Diabetic   CATARACT EXTRACTION W/PHACO Right 09/04/2020   Procedure: CATARACT EXTRACTION PHACO AND INTRAOCULAR LENS PLACEMENT (IOC) RIGHT DIABETIC;  Surgeon: Mittie Gaskin, MD;  Location:  [facility];  Service: Ophthalmology;  Laterality: Right;  4.72 1:03.2 7.4%   CHOLECYSTECTOMY  10/20/1975   COLONOSCOPY WITH PROPOFOL  N/A 01/21/2016   Procedure: COLONOSCOPY WITH PROPOFOL ;  Surgeon: Rogelia Copping, MD;  Location: [facility];  Service: Endoscopy;  Laterality: N/A;   DILATION AND CURETTAGE OF UTERUS     Due to Amenorrhea   ESOPHAGOGASTRODUODENOSCOPY (EGD) WITH PROPOFOL  N/A 08/16/2018   Procedure: ESOPHAGOGASTRODUODENOSCOPY (EGD) WITH PROPOFOL ;  Surgeon: Copping Rogelia, MD;  Location: [facility];  Service: Endoscopy;  Laterality: N/A;   EYE SURGERY     HEMORRHOID BANDING  10/19/1978   HEMORROIDECTOMY     HERNIA REPAIR  10/19/2009   Temporal Area Excision Biopsy     For Birth Mark Changes, Negative Pathology   TONSILLECTOMY AND ADENOIDECTOMY      Medications Prior to Admission  Medication Sig Dispense Refill Last Dose/Taking   acetaminophen  (TYLENOL ) 500 MG tablet Take 1 tablet (500 mg total) by mouth every 6 (six) hours as needed. (Patient taking differently: Take 1,000 mg by mouth 3 (three) times daily.) 480 tablet 0 06/06/2024 Evening   allopurinol  (ZYLOPRIM ) 100 MG tablet Take 1 tablet (100 mg total) by mouth 2 (two) times daily. 180 tablet 3 06/06/2024 Evening   atorvastatin  (LIPITOR) 20 MG tablet TAKE 1 TABLET BY MOUTH DAILY FOR CHOLESTEROL 90 tablet 3 06/06/2024 Morning   Blood Pressure KIT 1 each by Does not apply route daily. 1 kit 0 Taking   clobetasol  ointment (TEMOVATE ) 0.05 % Apply 1 Application  topically at bedtime. Apply to the affected area nightly for 4 weeks.   06/06/2024 Bedtime   dapagliflozin  propanediol (FARXIGA ) 10 MG TABS tablet Take 1 tablet (10 mg total) by mouth daily before breakfast. 90 tablet 3 06/06/2024 Morning   Dulaglutide  (TRULICITY ) 4.5 MG/0.5ML SOAJ Inject 4.5 mg as directed once a week. (Patient taking differently: Inject 5 mg as directed once a week.) 8 mL 2 06/01/2024   furosemide  (LASIX ) 20 MG tablet Take 1 tablet (20 mg total) by  mouth 2 (two) times daily. Second dose around noon 180 tablet 1 06/07/2024 Morning   gabapentin  (NEURONTIN ) 300 MG capsule TAKE 1 CAPSULE BY MOUTH IN THE MORNING, 1 CAPSULE IN THE EVENING AND 2 CAPSULES AT NIGHT 360 capsule 3 06/06/2024 Bedtime   glucose blood (ONETOUCH VERIO) test strip USE AS DIRECTED 50 each 1 Taking   Lancets (ONETOUCH ULTRASOFT) lancets Use as instructed 100 each 12 Taking   metFORMIN  (GLUCOPHAGE -XR) 750 MG 24 hr tablet Take 1 tablet (750 mg total) by mouth daily with breakfast. 90 tablet 1 06/06/2024 Morning   methocarbamol  (ROBAXIN ) 500 MG tablet Take 500 mg by mouth 3 (three) times daily.   06/06/2024 Bedtime   potassium chloride  SA (KLOR-CON  M) 20 MEQ tablet TAKE 1 TABLET BY MOUTH DAILY 90 tablet 1 06/06/2024 Morning   traMADol  (ULTRAM ) 50 MG tablet Take 25-50 mg by mouth 2 (two) times daily as needed.   06/06/2024 Evening   triamcinolone  ointment (KENALOG ) 0.1 % APPLY TOPICALLY TWICE DAILY AS NEEDED. GENERIC EQUIVALENT FOR KENALOG  90 g 0 Unknown   vitamin B-12 (CYANOCOBALAMIN ) 500 MCG tablet Take 500 mcg by mouth daily.   06/06/2024 Morning   Vitamin D , Cholecalciferol , 25 MCG (1000 UT) TABS Take 1 capsule by mouth daily. 90 tablet 1 06/06/2024 Morning   VOLTAREN  1 % GEL Apply 4 g topically 4 (four) times daily. 300 g 3 06/06/2024 Evening   albuterol  (VENTOLIN  HFA) 108 (90 Base) MCG/ACT inhaler Inhale 2 puffs into the lungs every 6 (six) hours as needed for wheezing or shortness of breath. 8 g 5 Unknown   Social History   Socioeconomic History   Marital status: Divorced    Spouse name: Not on file   Number of children: 0   Years of education: Not on file   Highest education level: GED or equivalent  Occupational History   Occupation: Retired  Tobacco Use   Smoking status: Former    Current packs/day: 0.00    Average packs/day: 2.0 packs/day for 15.0 years (30.0 ttl pk-yrs)    Types: Cigarettes    Start date: 97    Quit date: 1983    Years since quitting: 42.6    Smokeless tobacco: Never   Tobacco comments:    smoking cessation materials not required  Vaping Use   Vaping status: Never Used  Substance and Sexual Activity   Alcohol  use: Not Currently    Alcohol /week: 0.0 standard drinks of alcohol    Drug use: No   Sexual activity: Not Currently  Other Topics Concern   Not on file  Social History Narrative   Patient just moved in with her youngest sister and a roommate    Social Drivers of Corporate investment banker Strain: Low Risk  (05/25/2024)   Overall Financial Resource Strain (CARDIA)    Difficulty of Paying Living Expenses: Not very hard  Food Insecurity: No Food Insecurity (06/08/2024)   Hunger Vital Sign    Worried About Running Out of Food in the Last  Year: Never true    Ran Out of Food in the Last Year: Never true  Recent Concern: Food Insecurity - Food Insecurity Present (05/02/2024)   Hunger Vital Sign    Worried About Running Out of Food in the Last Year: Never true    Ran Out of Food in the Last Year: Sometimes true  Transportation Needs: No Transportation Needs (06/08/2024)   PRAPARE - Administrator, Civil Service (Medical): No    Lack of Transportation (Non-Medical): No  Physical Activity: Insufficiently Active (05/25/2024)   Exercise Vital Sign    Days of Exercise per Week: 3 days    Minutes of Exercise per Session: 10 min  Stress: No Stress Concern Present (05/25/2024)   [employer] of Occupational Health - Occupational Stress Questionnaire    Feeling of Stress: Not at all  Social Connections: Socially Isolated (06/08/2024)   Social Connection and Isolation Panel    Frequency of Communication with Friends and Family: Once a week    Frequency of Social Gatherings with Friends and Family: Once a week    Attends Religious Services: Never    Database administrator or Organizations: No    Attends Banker Meetings: Never    Marital Status: Divorced  Catering manager Violence: Not At Risk  (06/08/2024)   Humiliation, Afraid, Rape, and Kick questionnaire    Fear of Current or Ex-Partner: No    Emotionally Abused: No    Physically Abused: No    Sexually Abused: No    Family History  Problem Relation Age of Onset   Cancer Mother        brain tumor   Kidney disease Mother    Hypertension Mother    Heart disease Father    COPD Father    Hearing loss Father    Hypertension Sister    Arthritis Sister    Cancer Sister    Heart murmur Sister    GER disease Sister    Lupus Sister    Diabetes Sister    Arthritis Sister    Osteopenia Sister    Heart disease Sister    Hypertension Sister    Hearing loss Sister    Obesity Sister    Breast cancer Maternal Aunt 81   Leukemia Maternal Aunt      Vitals:   06/12/24 2245 06/13/24 0300 06/13/24 0731 06/13/24 0843  BP: 127/74 120/68 124/80 124/80  Pulse: 73 75 83   Resp: 18 15 (!) 22   Temp: 98.1 F (36.7 C) 98 F (36.7 C) 98.3 F (36.8 C)   TempSrc:  Oral    SpO2: 97% 99% 98%   Weight:      Height:        PHYSICAL EXAM General: Ill-appearing female, well nourished, in no acute distress. HEENT: Normocephalic and atraumatic. Neck: No JVD.  Lungs: Normal respiratory effort. Clear bilaterally to auscultation. No wheezes, crackles, rhonchi.  Heart: Irregularly irregular, controlled rates. Normal S1 and S2 without gallops or murmurs.  Abdomen: Non-distended appearing.  Msk: Normal strength and tone for age. Extremities: Warm and well perfused. No clubbing, cyanosis.  No edema.  Neuro: Alert and oriented X 3. Psych: Answers questions appropriately.   Labs: Basic Metabolic Panel: Recent Labs    06/11/24 0636 06/13/24 0435  NA 141 146*  K 4.3 4.3  CL 112* 115*  CO2 21* 25  GLUCOSE 162* 154*  BUN 46* 33*  CREATININE 0.98 0.90  CALCIUM  9.1 9.4  MG 2.4  1.9   Liver Function Tests: No results for input(s): AST, ALT, ALKPHOS, BILITOT, PROT, ALBUMIN in the last 72 hours.  No results for input(s):  LIPASE, AMYLASE in the last 72 hours. CBC: Recent Labs    06/12/24 0539  WBC 10.1  HGB 13.4  HCT 41.5  MCV 94.5  PLT 361   Cardiac Enzymes: No results for input(s): CKTOTAL, CKMB, CKMBINDEX, TROPONINIHS in the last 72 hours.  BNP: No results for input(s): BNP in the last 72 hours. D-Dimer: No results for input(s): DDIMER in the last 72 hours.  Hemoglobin A1C: No results for input(s): HGBA1C in the last 72 hours. Fasting Lipid Panel: No results for input(s): CHOL, HDL, LDLCALC, TRIG, CHOLHDL, LDLDIRECT in the last 72 hours. Thyroid  Function Tests: No results for input(s): TSH, T4TOTAL, T3FREE, THYROIDAB in the last 72 hours.  Invalid input(s): FREET3  Anemia Panel: No results for input(s): VITAMINB12, FOLATE, FERRITIN, TIBC, IRON , RETICCTPCT in the last 72 hours.   Radiology: ECHOCARDIOGRAM COMPLETE Result Date: 06/07/2024    ECHOCARDIOGRAM REPORT   Patient Name:   Amber Avery Date of Exam: 06/07/2024 Medical Rec #:  [id number]       Height:       61.0 in Accession #:    [id number]      Weight:       247.8 lb Date of Birth:  06-21-1946       BSA:          2.069 m Patient Age:    77 years        BP:           137/103 mmHg Patient Gender: F               HR:           109 bpm. Exam Location:  [facility] Procedure: 2D Echo, Color Doppler and Cardiac Doppler (Both Spectral and Color            Flow Doppler were utilized during procedure). Indications:     Atrial Fibrillation I48.91  History:         Patient has no prior history of Echocardiogram examinations.                  Risk Factors:Hypertension, Diabetes and Sleep Apnea.  Sonographer:     Christopher Furnace Referring Phys:  [id number] JAN A MANSY Diagnosing Phys: Dwayne D Callwood MD  Sonographer Comments: Technically challenging study due to limited acoustic windows. Image acquisition challenging due to patient body habitus. IMPRESSIONS  1. Left ventricular ejection fraction, by estimation, is  50 to 55%. The left ventricle has low normal function. The left ventricle has no regional wall motion abnormalities. Left ventricular diastolic function could not be evaluated.  2. Right ventricular systolic function is low normal. The right ventricular size is mildly enlarged.  3. The mitral valve is normal in structure. Trivial mitral valve regurgitation.  4. The aortic valve is normal in structure. Aortic valve regurgitation is not visualized. FINDINGS  Left Ventricle: Left ventricular ejection fraction, by estimation, is 50 to 55%. The left ventricle has low normal function. The left ventricle has no regional wall motion abnormalities. Strain was performed and the global longitudinal strain is indeterminate. The left ventricular internal cavity size was normal in size. There is borderline left ventricular hypertrophy. Left ventricular diastolic function could not be evaluated. Right Ventricle: The right ventricular size is mildly enlarged. No increase in right ventricular wall thickness. Right ventricular systolic function  is low normal. Left Atrium: Left atrial size was normal in size. Right Atrium: Right atrial size was normal in size. Pericardium: There is no evidence of pericardial effusion. Mitral Valve: The mitral valve is normal in structure. Trivial mitral valve regurgitation. Tricuspid Valve: The tricuspid valve is normal in structure. Tricuspid valve regurgitation is mild. Aortic Valve: The aortic valve is normal in structure. Aortic valve regurgitation is not visualized. Aortic valve mean gradient measures 7.0 mmHg. Aortic valve peak gradient measures 12.5 mmHg. Aortic valve area, by VTI measures 2.24 cm. Pulmonic Valve: The pulmonic valve was normal in structure. Pulmonic valve regurgitation is not visualized. Aorta: The ascending aorta was not well visualized. IAS/Shunts: No atrial level shunt detected by color flow Doppler. Additional Comments: 3D was performed not requiring image post processing  on an independent workstation and was indeterminate.  LEFT VENTRICLE PLAX 2D LVIDd:         4.20 cm LVIDs:         3.10 cm LV PW:         1.20 cm LV IVS:        0.90 cm LVOT diam:     2.00 cm LV SV:         51 LV SV Index:   25 LVOT Area:     3.14 cm  RIGHT VENTRICLE RV Basal diam:  3.50 cm RV Mid diam:    2.90 cm RV S prime:     11.70 cm/s TAPSE (M-mode): 2.1 cm LEFT ATRIUM             Index        RIGHT ATRIUM           Index LA diam:        3.60 cm 1.74 cm/m   RA Area:     15.10 cm LA Vol (A2C):   62.0 ml 29.97 ml/m  RA Volume:   37.70 ml  18.22 ml/m LA Vol (A4C):   70.8 ml 34.22 ml/m LA Biplane Vol: 66.3 ml 32.05 ml/m  AORTIC VALVE AV Area (Vmax):    2.15 cm AV Area (Vmean):   2.20 cm AV Area (VTI):     2.24 cm AV Vmax:           177.00 cm/s AV Vmean:          122.000 cm/s AV VTI:            0.229 m AV Peak Grad:      12.5 mmHg AV Mean Grad:      7.0 mmHg LVOT Vmax:         121.00 cm/s LVOT Vmean:        85.500 cm/s LVOT VTI:          0.163 m LVOT/AV VTI ratio: 0.71  AORTA Ao Root diam: 2.70 cm MITRAL VALVE                TRICUSPID VALVE MV Area (PHT): 4.41 cm     TR Peak grad:   19.0 mmHg MV Decel Time: 172 msec     TR Vmax:        218.00 cm/s MV E velocity: 138.00 cm/s                             SHUNTS  Systemic VTI:  0.16 m                             Systemic Diam: 2.00 cm Cara JONETTA Lovelace MD Electronically signed by Cara JONETTA Lovelace MD Signature Date/Time: 06/07/2024/5:10:06 PM    Final    CT Angio Chest PE W and/or Wo Contrast Result Date: 06/07/2024 CLINICAL DATA:  Pulmonary embolism (PE) suspected, low to intermediate prob, positive D-dimer. Suspected sepsis. EXAM: CT ANGIOGRAPHY CHEST WITH CONTRAST TECHNIQUE: Multidetector CT imaging of the chest was performed using the standard protocol during bolus administration of intravenous contrast. Multiplanar CT image reconstructions and MIPs were obtained to evaluate the vascular anatomy. RADIATION DOSE REDUCTION:  This exam was performed according to the departmental dose-optimization program which includes automated exposure control, adjustment of the mA and/or kV according to patient size and/or use of iterative reconstruction technique. CONTRAST:  75mL OMNIPAQUE  IOHEXOL  350 MG/ML SOLN COMPARISON:  12/23/2014 FINDINGS: Cardiovascular: No filling defects in the pulmonary arteries to suggest pulmonary emboli. Heart is normal size. Aorta is normal caliber. Scattered coronary artery and aortic calcifications. Mediastinum/Nodes: No mediastinal, hilar, or axillary adenopathy. Trachea and esophagus are unremarkable. Multiple calcified nodules in the thyroid  are unchanged since prior study. Lungs/Pleura: No confluent airspace opacities or effusions. Upper Abdomen: No acute findings Musculoskeletal: Chest wall soft tissues are unremarkable. No acute bony abnormality. Review of the MIP images confirms the above findings. IMPRESSION: No evidence of pulmonary embolus. Coronary artery disease. No acute cardiopulmonary disease. Aortic Atherosclerosis (ICD10-I70.0). Electronically Signed   By: Franky Crease M.D.   On: 06/07/2024 01:01   CT ABDOMEN PELVIS W CONTRAST Result Date: 06/07/2024 CLINICAL DATA:  Sepsis EXAM: CT ABDOMEN AND PELVIS WITH CONTRAST TECHNIQUE: Multidetector CT imaging of the abdomen and pelvis was performed using the standard protocol following bolus administration of intravenous contrast. RADIATION DOSE REDUCTION: This exam was performed according to the departmental dose-optimization program which includes automated exposure control, adjustment of the mA and/or kV according to patient size and/or use of iterative reconstruction technique. CONTRAST:  75mL OMNIPAQUE  IOHEXOL  350 MG/ML SOLN COMPARISON:  None Available. FINDINGS: Lower chest: No acute abnormality Hepatobiliary: No focal liver abnormality is seen. Status post cholecystectomy. No biliary dilatation. Pancreas: No focal abnormality or ductal dilatation.  Spleen: No focal abnormality.  Normal size. Adrenals/Urinary Tract: Adrenal glands normal. No renal mass, stone or hydronephrosis. Nonspecific bilateral perinephric stranding. Urinary bladder decompressed, grossly unremarkable. Stomach/Bowel: Left colonic diverticulosis. No active diverticulitis. Stomach and small bowel decompressed. No bowel obstruction or inflammatory process. Vascular/Lymphatic: No evidence of aneurysm or adenopathy. Reproductive: 6 cm partially calcified left uterine fibroid. 2.8 cm cyst in the left ovary. Right ovary unremarkable. Other: No free fluid or free air. Musculoskeletal: No acute bony abnormality. IMPRESSION: No acute findings in the abdomen or pelvis. Nonspecific bilateral perinephric stranding which may be related to remote insult. No hydronephrosis or stones. Aortic atherosclerosis. Left colonic diverticulosis. Electronically Signed   By: Franky Crease M.D.   On: 06/07/2024 00:59   CT HEAD WO CONTRAST ( ) Result Date: 06/06/2024 CLINICAL DATA:  Headache, new onset (Age >= 51y) generalized weakness and headache EXAM: CT HEAD WITHOUT CONTRAST TECHNIQUE: Contiguous axial images were obtained from the base of the skull through the vertex without intravenous contrast. RADIATION DOSE REDUCTION: This exam was performed according to the departmental dose-optimization program which includes automated exposure control, adjustment of the mA and/or kV according to patient size and/or use of iterative reconstruction  technique. COMPARISON:  CT head 12/23/2014 FINDINGS: Brain: No evidence of large-territorial acute infarction. No parenchymal hemorrhage. No mass lesion. No extra-axial collection. No mass effect or midline shift. No hydrocephalus. Basilar cisterns are patent. Vascular: No hyperdense vessel. Atherosclerotic calcifications are present within the cavernous internal carotid arteries. Skull: No acute fracture or focal lesion. Sinuses/Orbits: Paranasal sinuses and mastoid air cells  are clear. Bilateral lens replacement. Otherwise the orbits are unremarkable. Other: None. IMPRESSION: No acute intracranial abnormality. Electronically Signed   By: Morgane  Naveau M.D.   On: 06/06/2024 23:35   DG Chest Port 1 View if patient is in a treatment room. Result Date: 06/06/2024 CLINICAL DATA:  Suspected sepsis, former smoker, asthma EXAM: PORTABLE CHEST 1 VIEW COMPARISON:  12/23/2014 FINDINGS: Stable cardiomediastinal silhouette. Aortic atherosclerotic calcification. Chronic interstitial coarsening. No focal consolidation, pleural effusion, or pneumothorax. No displaced rib fractures. IMPRESSION: Chronic interstitial coarsening.  No acute abnormality. Electronically Signed   By: Norman Gatlin M.D.   On: 06/06/2024 23:20    ECHO as above  TELEMETRY reviewed by me 06/13/2024: Atrial fibrillation 80-90s  EKG reviewed by me: Atrial fibrillation RVR rate 136 bpm  Data reviewed by me 06/13/2024: last 24h vitals tele labs imaging I/O hospitalist progress notes.  Principal Problem:   Sepsis due to gram-negative UTI [facility]) Active Problems:   Dyslipidemia   Atrial fibrillation with rapid ventricular response (HCC)   Controlled type 2 diabetes mellitus without complication, without long-term current use of insulin  (HCC)   Gout    ASSESSMENT AND PLAN:  Amber Avery is a 78 y.o. female  with a past medical history of hypertension, hyperlipidemia, type 2 diabetes, gout, obstructive sleep apnea who presented to the ED on 06/06/2024 for weakness and fatigue.  Noted to be in atrial fibrillation RVR in the ED.  Cardiology was consulted for further evaluation.   # Atrial fibrillation RVR # New onset atrial fibrillation  # Sepsis 2/2 UTI # Hypertension # Hyperlipidemia Patient presented with worsening weakness, found to have UTI on UA.  Also noted to be in atrial fibrillation RVR on EKG.  Started on IV diltiazem  in the ED. Per tele remains in AF with controlled rates. - Continue metoprolol   tartrate to 50 mg BID - Increased Cardizem  to 240 mg daily. - Continue Warfrain per pharmacy (Warfarin over Eliquis  due to cost burden). INR 2.5, at goal. - Continue atorvastatin  20 mg daily. - Minimally elevated and flat trending troponin most consistent with demand/supply mismatch and not ACS   Ok for discharge today from a cardiac perspective. Will arrange for follow up in clinic with Dr. Wilburn in 1-2 weeks. Will sign off.   This patient's plan of care was discussed and created with Dr. Wilburn and he is in agreement.  Signed: Dorene Comfort, PA-C  06/13/2024, 10:32 AM [facility]

## 2024-06-13 NOTE — Progress Notes (Signed)
 Occupational Therapy Treatment Patient Details Name: Amber Avery MRN: [id number] DOB: 30-Sep-1946 Today's Date: 06/13/2024   History of present illness Pt is a 78 y/o F presenting to ED with c/o weakness, HA, fever, chills. Workup for sepsis 2/2 UTI and new onset afib w/ RVR. PMH significant for asthma, T2DM, gout, HTN, dyslipidemia, OSA on CPAP.   OT comments  Upon entering the room, pt seated in recliner chair with sister present in room. Pt is agreeable to OT intervention. Pt performing supine >sit with CGA and use of SPC. Pt declines uses of RW and states that [facility] is easier for her. However, she is observed to be furniture walking with other hand not holding onto cane. Pt ambulates to sink to wash face with set up A and CGA for balance. Pt then reports need for toileting and sits on BSC with min A for balance while managing LB clothing. Pt having BM and able to perform hygiene while seated. Pt stands to pull brief over B hips with assist for balance and washes hands before returning back to recliner chair in same manner. Call bell and all needed items within reach.       If plan is discharge home, recommend the following:  A little help with walking and/or transfers;A little help with bathing/dressing/bathroom;Help with stairs or ramp for entrance   Equipment Recommendations  BSC/3in1       Precautions / Restrictions Precautions Precautions: Fall Recall of Precautions/Restrictions: Intact       Mobility Bed Mobility Overal bed mobility: Needs Assistance Bed Mobility: Supine to Sit, Sit to Supine     Supine to sit: Supervision, HOB elevated, Used rails Sit to supine: Supervision, Used rails        Transfers Overall transfer level: Needs assistance Equipment used: Straight cane Transfers: Sit to/from Stand Sit to Stand: Contact guard assist, Min assist                 Balance Overall balance assessment: Needs assistance Sitting-balance support: Feet  supported Sitting balance-Leahy Scale: Good Sitting balance - Comments: on BSC   Standing balance support: Single extremity supported, Reliant on assistive device for balance Standing balance-Leahy Scale: Fair Standing balance comment: no LOB                           ADL either performed or assessed with clinical judgement   ADL Overall ADL's : Needs assistance/impaired     Grooming: Wash/dry hands;Wash/dry face;Standing;Contact guard assist                   Toilet Transfer: Minimal assistance;Ambulation Toilet Transfer Details (indicate cue type and reason): SPC Toileting- Clothing Manipulation and Hygiene: Minimal assistance;Sit to/from stand              Extremity/Trunk Assessment Upper Extremity Assessment Upper Extremity Assessment: Generalized weakness   Lower Extremity Assessment Lower Extremity Assessment: Generalized weakness        Vision Patient Visual Report: No change from baseline           Communication Communication Communication: No apparent difficulties   Cognition Arousal: Alert Behavior During Therapy: WFL for tasks assessed/performed Cognition: No apparent impairments                               Following commands: Impaired Following commands impaired: Follows one step commands with increased time  Cueing   Cueing Techniques: Verbal cues, Visual cues             Pertinent Vitals/ Pain       Pain Assessment Pain Assessment: No/denies pain         Frequency  Min 3X/week        Progress Toward Goals  OT Goals(current goals can now be found in the care plan section)  Progress towards OT goals: Progressing toward goals  Acute Rehab OT Goals OT Goal Formulation: With patient Time For Goal Achievement: 06/23/24 Potential to Achieve Goals: Good  Plan         AM-PAC OT 6 Clicks Daily Activity     Outcome Measure   Help from another person eating meals?: None Help from another  person taking care of personal grooming?: A Little Help from another person toileting, which includes using toliet, bedpan, or urinal?: A Little Help from another person bathing (including washing, rinsing, drying)?: A Little Help from another person to put on and taking off regular upper body clothing?: A Little Help from another person to put on and taking off regular lower body clothing?: A Little 6 Click Score: 19    End of Session Equipment Utilized During Treatment: Other (comment) (SPC)  OT Visit Diagnosis: Other abnormalities of gait and mobility (R26.89);Muscle weakness (generalized) (M62.81)   Activity Tolerance Patient tolerated treatment well   Patient Left with call bell/phone within reach;with family/visitor present;in chair;Other (comment) (MD present)   Nurse Communication Mobility status        Time: 1100-1118 OT Time Calculation (min): 18 min  Charges: OT General Charges $OT Visit: 1 Visit OT Treatments $Self Care/Home Management : 8-22 mins  Izetta Claude, MS, OTR/L , CBIS ascom [phone]  06/13/24, 3:03 PM

## 2024-06-13 NOTE — Progress Notes (Signed)
 Physical Therapy Treatment Patient Details Name: Amber Avery MRN: [id number] DOB: 08-17-1946 Today's Date: 06/13/2024   History of Present Illness Pt is a 78 y/o F presenting to ED with c/o weakness, HA, fever, chills. Workup for sepsis 2/2 UTI and new onset afib w/ RVR. PMH significant for asthma, T2DM, gout, HTN, dyslipidemia, OSA on CPAP.    PT Comments  Pt A&Ox4, agreeable to participate with increased encouragement, denied pain throughout session. Supine <> sit transfers with no physical assistance, pt with heavy use of bed rails to achieve sitting/supine. STS from EOB with SPC and CGA. Pt amb ~22ft with SPC and CGA, no LOB or excessive path variance. Vitals assessed after amb with HR 78bpm and SpO2 95% on RA. The patient would benefit from further skilled PT intervention to continue to progress towards goals.    If plan is discharge home, recommend the following: A little help with walking and/or transfers;A little help with bathing/dressing/bathroom;Assistance with cooking/housework;Assist for transportation;Help with stairs or ramp for entrance   Can travel by private vehicle     Yes  Equipment Recommendations  Rolling walker (2 wheels)    Recommendations for Other Services       Precautions / Restrictions Precautions Precautions: Fall Recall of Precautions/Restrictions: Intact Restrictions Weight Bearing Restrictions Per Provider Order: No     Mobility  Bed Mobility Overal bed mobility: Needs Assistance Bed Mobility: Supine to Sit, Sit to Supine     Supine to sit: Supervision, HOB elevated, Used rails Sit to supine: Supervision, Used rails   General bed mobility comments: no physical assistance for supine <> sit, reliant on bed rail    Transfers Overall transfer level: Needs assistance Equipment used: Straight cane Transfers: Sit to/from Stand Sit to Stand: Contact guard assist           General transfer comment: STS <> EOB with SPC and CGA.     Ambulation/Gait Ambulation/Gait assistance: Contact guard assist Gait Distance (Feet): 24 Feet Assistive device: Straight cane Gait Pattern/deviations: Step-through pattern, Shuffle, Trunk flexed, Wide base of support       General Gait Details: decreased cadence, no LOB, SPC in R UE   Stairs             Wheelchair Mobility     Tilt Bed    Modified Rankin (Stroke Patients Only)       Balance Overall balance assessment: Needs assistance Sitting-balance support: Feet supported Sitting balance-Leahy Scale: Good     Standing balance support: Single extremity supported, Reliant on assistive device for balance Standing balance-Leahy Scale: Fair Standing balance comment: no LOB                            Communication Communication Communication: No apparent difficulties  Cognition Arousal: Alert Behavior During Therapy: WFL for tasks assessed/performed   PT - Cognitive impairments: No apparent impairments                       PT - Cognition Comments: A&Ox4 Following commands: Impaired Following commands impaired: Follows one step commands with increased time    Cueing Cueing Techniques: Verbal cues, Visual cues  Exercises Other Exercises Other Exercises: Vitals assessed after short bout of amb: HR 78bpm SpO2 95% on RA.    General Comments        Pertinent Vitals/Pain Pain Assessment Pain Assessment: No/denies pain    Home Living  Prior Function            PT Goals (current goals can now be found in the care plan section) Progress towards PT goals: Progressing toward goals    Frequency    Min 2X/week      PT Plan      Co-evaluation              AM-PAC PT 6 Clicks Mobility   Outcome Measure  Help needed turning from your back to your side while in a flat bed without using bedrails?: A Little Help needed moving from lying on your back to sitting on the side of a flat  bed without using bedrails?: A Little Help needed moving to and from a bed to a chair (including a wheelchair)?: A Little Help needed standing up from a chair using your arms (e.g., wheelchair or bedside chair)?: A Little Help needed to walk in hospital room?: A Little Help needed climbing 3-5 steps with a railing? : A Lot 6 Click Score: 17    End of Session Equipment Utilized During Treatment: Gait belt Activity Tolerance: Patient limited by fatigue Patient left: in bed;with call bell/phone within reach;with bed alarm set Nurse Communication: Mobility status PT Visit Diagnosis: Other abnormalities of gait and mobility (R26.89);Muscle weakness (generalized) (M62.81);History of falling (Z91.81);Difficulty in walking, not elsewhere classified (R26.2)     Time: 8643-8586 PT Time Calculation (min) (ACUTE ONLY): 17 min  Charges:    $Therapeutic Activity: 8-22 mins PT General Charges $$ ACUTE PT VISIT: 1 Visit                     Lillianne Eick, SPT

## 2024-06-13 NOTE — Progress Notes (Signed)
 Progress Note   Patient: Amber Avery FMW:[id number] DOB: 07-17-1946 DOA: 06/06/2024     6 DOS: the patient was seen and examined on 06/13/2024   Brief hospital course: Amber Avery is a 78 y.o. obese female with medical history significant for asthma, CKD3b, type diabetes mellitus, gout, hypertension, dyslipidemia and OSA, who presented to the emergency room with acute onset of malaise and feeling sick since Tuesday with diaphoresis.  She had chills on Saturday.  She admits to urinary frequency and urgency without dysuria or hematuria or flank pain.  No chest pain or palpitations.  No dyspnea or cough or wheezing.  No nausea or vomiting or abdominal pain.  She denies any history of atrial fibrillation.    Assessment and Plan:   Atrial fibrillation with rapid ventricular Resolved  In the setting of infection.  Rate controlled,  CHA2DS2-VASc score is 5. TTE with EF 50 to 55%, RV systolic function low normal, RV size mildly enlarged, no significant valvular abnormality Per cardiology  Coumadin , dilt 180 mg daily, metop tartrate 50mg  bid. she will have follow up with cardiology clinic for INR checks   Sepsis due to gram-negative UTI (HCC) Sepsis physiology present on arrival, now resolved Urine cultures growing Klebsiella pneumoniae, Status post 3 doses of ceftriaxone  and 2 days of cefadroxil . No laboratory or clinical evidence of infection currently    Controlled type 2 diabetes mellitus without complication, without long-term current use of insulin  (HCC) A1c 7.3  - SSI CBG (last 3)  Recent Labs    06/13/24 0730 06/13/24 1056 06/13/24 1510  GLUCAP 156* 136* 155*   -Holding Farxiga  (d/t UTI) and metformin  while inpatient.  Basal bolus insulin  regimen while inpatient. Increase to 10 u LA and 3u TID wc   Dyslipidemia - Will continue statin therapy.   CKD 3b Unclear BL scr. Continues to improve. Diangosis of CKD3b in chart.     Latest Ref Rng & Units 06/13/2024    4:35  AM 06/11/2024    6:36 AM 06/10/2024    5:32 AM  BMP  Glucose 70 - 99 mg/dL 845  837  828   BUN 8 - 23 mg/dL 33  46  50   Creatinine 0.44 - 1.00 mg/dL 9.09  9.01  8.79   Sodium 135 - 145 mmol/L 146  141  138   Potassium 3.5 - 5.1 mmol/L 4.3  4.3  4.1   Chloride 98 - 111 mmol/L 115  112  109   CO2 22 - 32 mmol/L 25  21  23    Calcium  8.9 - 10.3 mg/dL 9.4  9.1  8.9   Avoid nephrotoxic agents  Will continue to monitor   Chornic venous insufficiency  Unna boots requested given patients difficulty with compression stockings.   Gout Resumed home dose allopurinol  given improvement   OSA  On CPAP.   BMI:  47.63 kg/m Obesity class III  Complicates care and overall prognosis.      Subjective: Eager to get to rehab.   Physical Exam: Vitals:   06/13/24 0843 06/13/24 1056 06/13/24 1509 06/13/24 1930  BP: 124/80 112/60 125/70 126/77  Pulse:  87 74 65  Resp:  (!) 22 (!) 22 19  Temp:   98.3 F (36.8 C) 98.2 F (36.8 C)  TempSrc:      SpO2:  96% 99% 93%  Weight:      Height:         Constitutional: In no distress.  Cardiovascular: Normal rate, regular rhythm.  1+ bilateral lower extremity edema  Pulmonary: Non labored breathing on room air, no wheezing or rales.   Abdominal: Soft. Non distended and non tender Musculoskeletal: Normal range of motion.     Neurological: Alert and oriented to person, place, and time. Non focal  Skin: Skin is warm and dry.     Data Reviewed:     Latest Ref Rng & Units 06/12/2024    5:39 AM 06/08/2024    5:41 AM 06/06/2024   10:59 PM  CBC  WBC 4.0 - 10.5 K/uL 10.1  12.6  10.7   Hemoglobin 12.0 - 15.0 g/dL 86.5  87.3  85.8   Hematocrit 36.0 - 46.0 % 41.5  37.8  42.5   Platelets 150 - 400 K/uL 361  151  142       Latest Ref Rng & Units 06/13/2024    4:35 AM 06/11/2024    6:36 AM 06/10/2024    5:32 AM  BMP  Glucose 70 - 99 mg/dL 845  837  828   BUN 8 - 23 mg/dL 33  46  50   Creatinine 0.44 - 1.00 mg/dL 9.09  9.01  8.79   Sodium 135 - 145  mmol/L 146  141  138   Potassium 3.5 - 5.1 mmol/L 4.3  4.3  4.1   Chloride 98 - 111 mmol/L 115  112  109   CO2 22 - 32 mmol/L 25  21  23    Calcium  8.9 - 10.3 mg/dL 9.4  9.1  8.9       Family Communication: Sister at bedside. Patient noted understanding of plan.   Disposition: Status is: Inpatient She is stable for discharge. She is awaiting SNF placement   Planned Discharge Destination: Pending     Time spent: 35 minutes  Author: Alban Pepper, MD 06/13/2024 7:45 PM  For on call review [email or web address].

## 2024-06-14 DIAGNOSIS — N390 Urinary tract infection, site not specified: Secondary | ICD-10-CM | POA: Diagnosis not present

## 2024-06-14 DIAGNOSIS — A4150 Gram-negative sepsis, unspecified: Secondary | ICD-10-CM | POA: Diagnosis not present

## 2024-06-14 LAB — GLUCOSE, CAPILLARY
Glucose-Capillary: 158 mg/dL — ABNORMAL HIGH (ref 70–99)
Glucose-Capillary: 119 mg/dL — ABNORMAL HIGH (ref 70–99)
Glucose-Capillary: 128 mg/dL — ABNORMAL HIGH (ref 70–99)
Glucose-Capillary: 164 mg/dL — ABNORMAL HIGH (ref 70–99)

## 2024-06-14 LAB — BASIC METABOLIC PANEL WITH GFR
Anion gap: 5 (ref 5–15)
BUN: 29 mg/dL — ABNORMAL HIGH (ref 8–23)
CO2: 24 mmol/L (ref 22–32)
Calcium: 9.3 mg/dL (ref 8.9–10.3)
Chloride: 114 mmol/L — ABNORMAL HIGH (ref 98–111)
Creatinine, Ser: 0.85 mg/dL (ref 0.44–1.00)
GFR, Estimated: >60 mL/min
Glucose, Bld: 154 mg/dL — ABNORMAL HIGH (ref 70–99)
Potassium: 4.3 mmol/L (ref 3.5–5.1)
Sodium: 143 mmol/L (ref 135–145)

## 2024-06-14 LAB — PROTIME-INR
INR: 2.7 — ABNORMAL HIGH (ref 0.8–1.2)
Prothrombin Time: 30.2 s — ABNORMAL HIGH (ref 11.4–15.2)

## 2024-06-14 LAB — MAGNESIUM: Magnesium: 1.7 mg/dL (ref 1.7–2.4)

## 2024-06-14 MED ORDER — WARFARIN SODIUM 4 MG PO TABS
4.0000 mg | ORAL_TABLET | Freq: Once | ORAL | Status: AC
  Administered 2024-06-14: 4 mg via ORAL
  Filled 2024-06-14: qty 1

## 2024-06-14 NOTE — Progress Notes (Signed)
 PHARMACY - ANTICOAGULATION CONSULT NOTE  Pharmacy Consult for warfarin Indication: atrial fibrillation  Allergies  Allergen Reactions   Codeine Hives and Nausea And Vomiting    Patient Measurements: Height: 5' 1 (154.9 cm) Weight: 105.6 kg (232 lb 14.4 oz) IBW/kg (Calculated) : 47.8  Vital Signs: Temp: 97.9 F (36.6 C) (08/27 0406) BP: 125/85 (08/27 0406) Pulse Rate: 75 (08/27 0406)  Labs: Recent Labs    06/12/24 0539 06/13/24 0435 06/14/24 0355  HGB 13.4  --   --   HCT 41.5  --   --   PLT 361  --   --   LABPROT 22.6* 27.8* 30.2*  INR 1.9* 2.5* 2.7*  CREATININE  --  0.90 0.85    Estimated Creatinine Clearance: 63 mL/min (by C-G formula based on SCr of 0.85 mg/dL).   Medical History: Past Medical History:  Diagnosis Date   Allergy    Arthritis    Asthma    Diabetes mellitus without complication (HCC)    Gout    Hyperlipidemia    Hypertension    Iron  deficiency anemia 04/20/2018   Oxygen  deficiency    PONV (postoperative nausea and vomiting)    after hemorroid surgery   Shortness of breath dyspnea    Sleep apnea     Medications:  Scheduled:   allopurinol   100 mg Oral BID   atorvastatin   20 mg Oral Daily   cholecalciferol   1,000 Units Oral Daily   cyanocobalamin   500 mcg Oral Daily   diclofenac  Sodium  4 g Topical QID   diltiazem   240 mg Oral Daily   insulin  aspart  0-15 Units Subcutaneous TID WC   insulin  aspart  0-5 Units Subcutaneous QHS   insulin  aspart  3 Units Subcutaneous TID WC   insulin  glargine  10 Units Subcutaneous Q2200   loratadine   10 mg Oral Daily   metoprolol  tartrate  50 mg Oral BID   neomycin -bacitracin -polymyxin   Topical Daily   potassium chloride  SA  20 mEq Oral Daily   Warfarin - Pharmacist Dosing Inpatient   Does not apply q1600   Assessment: 78 y.o. female with medical history significant for asthma, CKD3b, type diabetes mellitus, gout, hypertension, dyslipidemia and OSA, who presented to the emergency room with sepsis  due to gram-negative UTI. She was started on Eliquis  but is concerned about the affordability. CHADSVASc 5. No Major DDI, pt completed abx.   Goal of Therapy:  INR 2-3 Monitor platelets by anticoagulation protocol: Yes  Date:  INR: Dose: 8/23 1.4 7.5 mg 8/24 1.4 7.5 mg  8/25 1.9 7.5 mg  8/26 2.5 4 mg 8/27 2.7   Plan:  INR remains therapeutic at 2.7 after receiving 26.5mg  in 4 days. Will repeat warfarin 4mg  today ( Expected need 4-5mg  daily). Continue daily INR with AM labs. CBC at least every 3 days.  Eyla Tallon Rodriguez-Guzman PharmD, BCPS 06/14/2024 7:20 AM

## 2024-06-14 NOTE — Progress Notes (Signed)
 Progress Note   Patient: Amber Avery FMW:[id number] DOB: 31-Mar-1946 DOA: 06/06/2024     7 DOS: the patient was seen and examined on 06/14/2024    Brief hospital course: Amber Avery is a 78 y.o. obese female with medical history significant for asthma, CKD3b, type diabetes mellitus, gout, hypertension, dyslipidemia and OSA, who presented to the emergency room with acute onset of malaise and feeling sick since Tuesday with diaphoresis.  She had chills on Saturday.  She admits to urinary frequency and urgency without dysuria or hematuria or flank pain.  No chest pain or palpitations.  No dyspnea or cough or wheezing.  No nausea or vomiting or abdominal pain.  She denies any history of atrial fibrillation.      Assessment and Plan:     Atrial fibrillation with rapid ventricular Resolved  In the setting of infection.  Rate controlled,  CHA2DS2-VASc score is 5. TTE with EF 50 to 55%, RV systolic function low normal, RV size mildly enlarged, no significant valvular abnormality Per cardiology  Coumadin , dilt 180 mg daily, metop tartrate 50mg  bid. she will have follow up with cardiology clinic for INR checks  Pending skilled nursing facility  Sepsis due to gram-negative UTI (HCC) Sepsis physiology present on arrival, now resolved Urine cultures growing Klebsiella pneumoniae, Status post 3 doses of ceftriaxone  and 2 days of cefadroxil . No laboratory or clinical evidence of infection currently      Controlled type 2 diabetes mellitus without complication, without long-term current use of insulin  (HCC) Continue sliding scale insulin   -Holding Farxiga  (d/t UTI) and metformin  while inpatient.  Basal bolus insulin  regimen while inpatient. Increase to 10 u LA and 3u TID wc    Dyslipidemia - Will continue statin therapy.     CKD 3b Unclear BL scr. Continues to improve. Diangosis of CKD3b in chart.  Will continue to monitor    Chornic venous insufficiency  Unna boots requested given  patients difficulty with compression stockings.    Gout Resumed home dose allopurinol  given improvement    OSA  On CPAP.   BMI:  47.63 kg/m Obesity class III  Complicates care and overall prognosis.        Subjective: Eager to get to rehab.    Physical Exam:   Constitutional: In no distress.  Cardiovascular: Normal rate, regular rhythm. 1+ bilateral lower extremity edema  Pulmonary: Non labored breathing on room air, no wheezing or rales.   Abdominal: Soft. Non distended and non tender Musculoskeletal: Normal range of motion.     Neurological: Alert and oriented to person, place, and time. Non focal  Skin: Skin is warm and dry.        Family Communication: Sister at bedside. Patient noted understanding of plan.    Disposition: Status is: Inpatient She is stable for discharge. She is awaiting SNF placement   Planned Discharge Destination: Pending       Vitals:   06/14/24 1151 06/14/24 1442 06/14/24 1530 06/14/24 1717  BP: 112/66 107/61 113/72 136/83  Pulse: 75 (!) 52  76  Resp: 18 18 (!) 22   Temp: (!) 97.5 F (36.4 C) 98 F (36.7 C)  98.4 F (36.9 C)  TempSrc:      SpO2: 97% 95% 96% 97%  Weight:      Height:        Data Reviewed:    Latest Ref Rng & Units 06/12/2024    5:39 AM 06/08/2024    5:41 AM 06/06/2024   10:59 PM  CBC  WBC 4.0 - 10.5 K/uL 10.1  12.6  10.7   Hemoglobin 12.0 - 15.0 g/dL 86.5  87.3  85.8   Hematocrit 36.0 - 46.0 % 41.5  37.8  42.5   Platelets 150 - 400 K/uL 361  151  142        Latest Ref Rng & Units 06/14/2024    3:55 AM 06/13/2024    4:35 AM 06/11/2024    6:36 AM  BMP  Glucose 70 - 99 mg/dL 845  845  837   BUN 8 - 23 mg/dL 29  33  46   Creatinine 0.44 - 1.00 mg/dL 9.14  9.09  9.01   Sodium 135 - 145 mmol/L 143  146  141   Potassium 3.5 - 5.1 mmol/L 4.3  4.3  4.3   Chloride 98 - 111 mmol/L 114  115  112   CO2 22 - 32 mmol/L 24  25  21    Calcium  8.9 - 10.3 mg/dL 9.3  9.4  9.1        Author: Drue ONEIDA Potter, MD 06/14/2024  5:53 PM  For on call review [email or web address].

## 2024-06-14 NOTE — Plan of Care (Signed)
   Problem: Coping: Goal: Ability to adjust to condition or change in health will improve Outcome: Progressing   Problem: Fluid Volume: Goal: Ability to maintain a balanced intake and output will improve Outcome: Progressing   Problem: Health Behavior/Discharge Planning: Goal: Ability to identify and utilize available resources and services will improve Outcome: Progressing

## 2024-06-14 NOTE — Consult Note (Addendum)
 WOC Nurse Consult Note: Consult requested to apply bilat Una boots.  Chart reviewed and Pt does not have an ABI, which is best practice prior to [employer] application.  Assessed bilat legs; they are without open wounds or drainage and have mod amt edema.  Secure chat sent to the primary team as follows: I have assessed the patient's legs. There are no open wounds or weeping and only moderate amt edema. I do not feel that Una boots are indicated. If you would like, I can apply Ace wraps to decrease the edema. Thank-you for your consideration.   Post note 1245: Physician agreed and I applied bilat ace wraps.    Orders provided for bedside nurses to perform as follows for light compression: Reapply bilat kerlex and ace wraps Q Mon/Wed/Fri and PRN if loose; apply just behind toes to below knees.   Please re-consult if further assistance is needed.  Thank-you,  Stephane Fought MSN, RN, CWOCN, CWCN-AP, CNS Contact Mon-Fri 0700-1500: [phone]

## 2024-06-15 DIAGNOSIS — A4150 Gram-negative sepsis, unspecified: Secondary | ICD-10-CM | POA: Diagnosis not present

## 2024-06-15 DIAGNOSIS — N390 Urinary tract infection, site not specified: Secondary | ICD-10-CM | POA: Diagnosis not present

## 2024-06-15 LAB — CBC WITH DIFFERENTIAL/PLATELET
Abs Immature Granulocytes: 0.07 10*3/uL (ref 0.00–0.07)
Basophils Absolute: 0 10*3/uL (ref 0.0–0.1)
Basophils Relative: 1 %
Eosinophils Absolute: 0.1 10*3/uL (ref 0.0–0.5)
Eosinophils Relative: 2 %
HCT: 39.3 % (ref 36.0–46.0)
Hemoglobin: 12.7 g/dL (ref 12.0–15.0)
Immature Granulocytes: 1 %
Lymphocytes Relative: 23 %
Lymphs Abs: 1.4 10*3/uL (ref 0.7–4.0)
MCH: 30.9 pg (ref 26.0–34.0)
MCHC: 32.3 g/dL (ref 30.0–36.0)
MCV: 95.6 fL (ref 80.0–100.0)
Monocytes Absolute: 1.1 10*3/uL — ABNORMAL HIGH (ref 0.1–1.0)
Monocytes Relative: 18 %
Neutro Abs: 3.3 10*3/uL (ref 1.7–7.7)
Neutrophils Relative %: 55 %
Platelets: 331 10*3/uL (ref 150–400)
RBC: 4.11 MIL/uL (ref 3.87–5.11)
RDW: 16.4 % — ABNORMAL HIGH (ref 11.5–15.5)
WBC: 6 10*3/uL (ref 4.0–10.5)
nRBC: 0 % (ref 0.0–0.2)

## 2024-06-15 LAB — GLUCOSE, CAPILLARY
Glucose-Capillary: 137 mg/dL — ABNORMAL HIGH (ref 70–99)
Glucose-Capillary: 118 mg/dL — ABNORMAL HIGH (ref 70–99)
Glucose-Capillary: 139 mg/dL — ABNORMAL HIGH (ref 70–99)
Glucose-Capillary: 158 mg/dL — ABNORMAL HIGH (ref 70–99)

## 2024-06-15 LAB — PROTIME-INR
INR: 2.7 — ABNORMAL HIGH (ref 0.8–1.2)
Prothrombin Time: 30.2 s — ABNORMAL HIGH (ref 11.4–15.2)

## 2024-06-15 LAB — BASIC METABOLIC PANEL WITH GFR
Anion gap: 7 (ref 5–15)
BUN: 31 mg/dL — ABNORMAL HIGH (ref 8–23)
CO2: 22 mmol/L (ref 22–32)
Calcium: 9.1 mg/dL (ref 8.9–10.3)
Chloride: 113 mmol/L — ABNORMAL HIGH (ref 98–111)
Creatinine, Ser: 0.85 mg/dL (ref 0.44–1.00)
GFR, Estimated: >60 mL/min
Glucose, Bld: 152 mg/dL — ABNORMAL HIGH (ref 70–99)
Potassium: 4.1 mmol/L (ref 3.5–5.1)
Sodium: 142 mmol/L (ref 135–145)

## 2024-06-15 MED ORDER — WARFARIN SODIUM 4 MG PO TABS
4.0000 mg | ORAL_TABLET | Freq: Once | ORAL | Status: AC
  Administered 2024-06-15: 4 mg via ORAL
  Filled 2024-06-15: qty 1

## 2024-06-15 MED ORDER — WARFARIN SODIUM 5 MG PO TABS: 5.0000 mg | ORAL_TABLET | Freq: Every day | ORAL | Status: DC

## 2024-06-15 MED ORDER — TRAZODONE HCL 50 MG PO TABS: 25.0000 mg | ORAL_TABLET | Freq: Every evening | ORAL | Status: DC | PRN

## 2024-06-15 MED ORDER — POLYETHYLENE GLYCOL 3350 17 G PO PACK: 17.0000 g | PACK | Freq: Every day | ORAL | Status: AC | PRN

## 2024-06-15 MED ORDER — POLYVINYL ALCOHOL 1.4 % OP SOLN
1.0000 [drp] | OPHTHALMIC | Status: DC | PRN
  Administered 2024-06-15: 1 [drp] via OPHTHALMIC
  Filled 2024-06-15: qty 15

## 2024-06-15 MED ORDER — DILTIAZEM HCL ER COATED BEADS 240 MG PO CP24: 240.0000 mg | ORAL_CAPSULE | Freq: Every day | ORAL | Status: DC

## 2024-06-15 MED ORDER — METOPROLOL TARTRATE 50 MG PO TABS: 50.0000 mg | ORAL_TABLET | Freq: Two times a day (BID) | ORAL | Status: AC

## 2024-06-15 NOTE — Progress Notes (Signed)
 Progress Note   Patient: Amber Avery FMW:[id number] DOB: 04/30/1946 DOA: 06/06/2024     8 DOS: the patient was seen and examined on 06/15/2024     Brief hospital course: Amber Avery is a 78 y.o. obese female with medical history significant for asthma, CKD3b, type diabetes mellitus, gout, hypertension, dyslipidemia and OSA, who presented to the emergency room with acute onset of malaise and feeling sick since Tuesday with diaphoresis.  She had chills on Saturday.  She admits to urinary frequency and urgency without dysuria or hematuria or flank pain.  No chest pain or palpitations.  No dyspnea or cough or wheezing.  No nausea or vomiting or abdominal pain.  She denies any history of atrial fibrillation.      Assessment and Plan:     Atrial fibrillation with rapid ventricular Resolved  In the setting of infection.  Rate controlled,  CHA2DS2-VASc score is 5. TTE with EF 50 to 55%, RV systolic function low normal, RV size mildly enlarged, no significant valvular abnormality Per cardiology  Continue Coumadin , dilt 180 mg daily, metop tartrate 50mg  bid. she will have follow up with cardiology clinic for INR checks  Pending skilled nursing facility   Sepsis due to gram-negative UTI (HCC) Sepsis physiology present on arrival, now resolved Urine cultures growing Klebsiella pneumoniae, Status post 3 doses of ceftriaxone  and 2 days of cefadroxil . No laboratory or clinical evidence of infection currently      Controlled type 2 diabetes mellitus without complication, without long-term current use of insulin  (HCC) Continue sliding scale insulin    -Holding Farxiga  (d/t UTI) and metformin  while inpatient.  Basal bolus insulin  regimen while inpatient. Increase to 10 u LA and 3u TID wc    Dyslipidemia Continue statin therapy.     CKD 3b Unclear BL scr. Continues to improve. Diangosis of CKD3b in chart.  Will continue to monitor    Chornic venous insufficiency  Unna boots requested  given patients difficulty with compression stockings.    Gout Resumed home dose allopurinol  given improvement    OSA  On CPAP.   BMI:  47.63 kg/m Obesity class III  Complicates care and overall prognosis.        Subjective: Eager to get to rehab.    Physical Exam:   Constitutional: In no distress.  Cardiovascular: Normal rate, regular rhythm. 1+ bilateral lower extremity edema  Pulmonary: Non labored breathing on room air, no wheezing or rales.   Abdominal: Soft. Non distended and non tender Musculoskeletal: Normal range of motion.     Neurological: Alert and oriented to person, place, and time. Non focal  Skin: Skin is warm and dry.        Family Communication: Sister at bedside. Patient noted understanding of plan.    Disposition: Status is: Inpatient She is stable for discharge. She is awaiting SNF placement   Planned Discharge Destination: Pending    Data Reviewed:      Latest Ref Rng & Units 06/15/2024    6:16 AM 06/12/2024    5:39 AM 06/08/2024    5:41 AM  CBC  WBC 4.0 - 10.5 K/uL 6.0  10.1  12.6   Hemoglobin 12.0 - 15.0 g/dL 87.2  86.5  87.3   Hematocrit 36.0 - 46.0 % 39.3  41.5  37.8   Platelets 150 - 400 K/uL 331  361  151        Latest Ref Rng & Units 06/15/2024    6:16 AM 06/14/2024    3:55  AM 06/13/2024    4:35 AM  BMP  Glucose 70 - 99 mg/dL 847  845  845   BUN 8 - 23 mg/dL 31  29  33   Creatinine 0.44 - 1.00 mg/dL 9.14  9.14  9.09   Sodium 135 - 145 mmol/L 142  143  146   Potassium 3.5 - 5.1 mmol/L 4.1  4.3  4.3   Chloride 98 - 111 mmol/L 113  114  115   CO2 22 - 32 mmol/L 22  24  25    Calcium  8.9 - 10.3 mg/dL 9.1  9.3  9.4     Vitals:   06/15/24 0448 06/15/24 0817 06/15/24 1210 06/15/24 1626  BP: 128/70 (!) 111/53 105/64 129/79  Pulse: 74 (!) 49 (!) 102   Resp: 20 18 18 18   Temp: 97.6 F (36.4 C) 97.6 F (36.4 C) 97.6 F (36.4 C) 97.7 F (36.5 C)  TempSrc:      SpO2: 96% 96% 99% 97%  Weight:      Height:          Latest Ref Rng &  Units 06/15/2024    6:16 AM 06/12/2024    5:39 AM 06/08/2024    5:41 AM  CBC  WBC 4.0 - 10.5 K/uL 6.0  10.1  12.6   Hemoglobin 12.0 - 15.0 g/dL 87.2  86.5  87.3   Hematocrit 36.0 - 46.0 % 39.3  41.5  37.8   Platelets 150 - 400 K/uL 331  361  151        Latest Ref Rng & Units 06/15/2024    6:16 AM 06/14/2024    3:55 AM 06/13/2024    4:35 AM  BMP  Glucose 70 - 99 mg/dL 847  845  845   BUN 8 - 23 mg/dL 31  29  33   Creatinine 0.44 - 1.00 mg/dL 9.14  9.14  9.09   Sodium 135 - 145 mmol/L 142  143  146   Potassium 3.5 - 5.1 mmol/L 4.1  4.3  4.3   Chloride 98 - 111 mmol/L 113  114  115   CO2 22 - 32 mmol/L 22  24  25    Calcium  8.9 - 10.3 mg/dL 9.1  9.3  9.4      Author: Drue ONEIDA Potter, MD 06/15/2024 5:50 PM  For on call review [email or web address].

## 2024-06-15 NOTE — Plan of Care (Signed)

## 2024-06-15 NOTE — Plan of Care (Signed)
 Problem: Education: Goal: Ability to describe self-care measures that may prevent or decrease complications (Diabetes Survival Skills Education) will improve 06/15/2024 1116 by Johnie Will HERO, RN Outcome: Adequate for Discharge 06/15/2024 1035 by Johnie Will HERO, RN Outcome: Progressing Goal: Individualized Educational Video(s) 06/15/2024 1116 by Johnie Will HERO, RN Outcome: Adequate for Discharge 06/15/2024 1035 by Johnie Will HERO, RN Outcome: Progressing   Problem: Coping: Goal: Ability to adjust to condition or change in health will improve 06/15/2024 1116 by Johnie Will HERO, RN Outcome: Adequate for Discharge 06/15/2024 1035 by Johnie Will HERO, RN Outcome: Progressing   Problem: Fluid Volume: Goal: Ability to maintain a balanced intake and output will improve 06/15/2024 1116 by Johnie Will HERO, RN Outcome: Adequate for Discharge 06/15/2024 1035 by Johnie Will HERO, RN Outcome: Progressing   Problem: Health Behavior/Discharge Planning: Goal: Ability to identify and utilize available resources and services will improve 06/15/2024 1116 by Johnie Will HERO, RN Outcome: Adequate for Discharge 06/15/2024 1035 by Johnie Will HERO, RN Outcome: Progressing Goal: Ability to manage health-related needs will improve 06/15/2024 1116 by Johnie Will HERO, RN Outcome: Adequate for Discharge 06/15/2024 1035 by Johnie Will HERO, RN Outcome: Progressing   Problem: Metabolic: Goal: Ability to maintain appropriate glucose levels will improve 06/15/2024 1116 by Johnie Will HERO, RN Outcome: Adequate for Discharge 06/15/2024 1035 by Johnie Will HERO, RN Outcome: Progressing   Problem: Nutritional: Goal: Maintenance of adequate nutrition will improve 06/15/2024 1116 by Johnie Will HERO, RN Outcome: Adequate for Discharge 06/15/2024 1035 by Johnie Will HERO, RN Outcome: Progressing Goal: Progress toward achieving an optimal weight will  improve 06/15/2024 1116 by Johnie Will HERO, RN Outcome: Adequate for Discharge 06/15/2024 1035 by Johnie Will HERO, RN Outcome: Progressing   Problem: Skin Integrity: Goal: Risk for impaired skin integrity will decrease 06/15/2024 1116 by Johnie Will HERO, RN Outcome: Adequate for Discharge 06/15/2024 1035 by Johnie Will HERO, RN Outcome: Progressing   Problem: Tissue Perfusion: Goal: Adequacy of tissue perfusion will improve 06/15/2024 1116 by Johnie Will HERO, RN Outcome: Adequate for Discharge 06/15/2024 1035 by Johnie Will HERO, RN Outcome: Progressing   Problem: Fluid Volume: Goal: Hemodynamic stability will improve 06/15/2024 1116 by Johnie Will HERO, RN Outcome: Adequate for Discharge 06/15/2024 1035 by Johnie Will HERO, RN Outcome: Progressing   Problem: Clinical Measurements: Goal: Diagnostic test results will improve 06/15/2024 1116 by Johnie Will HERO, RN Outcome: Adequate for Discharge 06/15/2024 1035 by Johnie Will HERO, RN Outcome: Progressing Goal: Signs and symptoms of infection will decrease 06/15/2024 1116 by Johnie Will HERO, RN Outcome: Adequate for Discharge 06/15/2024 1035 by Johnie Will HERO, RN Outcome: Progressing   Problem: Respiratory: Goal: Ability to maintain adequate ventilation will improve 06/15/2024 1116 by Johnie Will HERO, RN Outcome: Adequate for Discharge 06/15/2024 1035 by Johnie Will HERO, RN Outcome: Progressing   Problem: Education: Goal: Knowledge of General Education information will improve Description: Including pain rating scale, medication(s)/side effects and non-pharmacologic comfort measures 06/15/2024 1116 by Johnie Will HERO, RN Outcome: Adequate for Discharge 06/15/2024 1035 by Johnie Will HERO, RN Outcome: Progressing   Problem: Health Behavior/Discharge Planning: Goal: Ability to manage health-related needs will improve 06/15/2024 1116 by Johnie Will HERO, RN Outcome:  Adequate for Discharge 06/15/2024 1035 by Johnie Will HERO, RN Outcome: Progressing   Problem: Clinical Measurements: Goal: Ability to maintain clinical measurements within normal limits will improve 06/15/2024 1116 by Johnie Will HERO, RN Outcome: Adequate for Discharge 06/15/2024 1035 by Johnie Will HERO, RN Outcome: Progressing Goal: Will remain free  from infection 06/15/2024 1116 by Johnie Will HERO, RN Outcome: Adequate for Discharge 06/15/2024 1035 by Johnie Will HERO, RN Outcome: Progressing Goal: Diagnostic test results will improve 06/15/2024 1116 by Johnie Will HERO, RN Outcome: Adequate for Discharge 06/15/2024 1035 by Johnie Will HERO, RN Outcome: Progressing Goal: Respiratory complications will improve 06/15/2024 1116 by Johnie Will HERO, RN Outcome: Adequate for Discharge 06/15/2024 1035 by Johnie Will HERO, RN Outcome: Progressing Goal: Cardiovascular complication will be avoided 06/15/2024 1116 by Johnie Will HERO, RN Outcome: Adequate for Discharge 06/15/2024 1035 by Johnie Will HERO, RN Outcome: Progressing   Problem: Activity: Goal: Risk for activity intolerance will decrease 06/15/2024 1116 by Johnie Will HERO, RN Outcome: Adequate for Discharge 06/15/2024 1035 by Johnie Will HERO, RN Outcome: Progressing   Problem: Nutrition: Goal: Adequate nutrition will be maintained 06/15/2024 1116 by Johnie Will HERO, RN Outcome: Adequate for Discharge 06/15/2024 1035 by Johnie Will HERO, RN Outcome: Progressing   Problem: Coping: Goal: Level of anxiety will decrease 06/15/2024 1116 by Johnie Will HERO, RN Outcome: Adequate for Discharge 06/15/2024 1035 by Johnie Will HERO, RN Outcome: Progressing   Problem: Elimination: Goal: Will not experience complications related to bowel motility 06/15/2024 1116 by Johnie Will HERO, RN Outcome: Adequate for Discharge 06/15/2024 1035 by Johnie Will HERO, RN Outcome:  Progressing Goal: Will not experience complications related to urinary retention 06/15/2024 1116 by Johnie Will HERO, RN Outcome: Adequate for Discharge 06/15/2024 1035 by Johnie Will HERO, RN Outcome: Progressing   Problem: Pain Managment: Goal: General experience of comfort will improve and/or be controlled 06/15/2024 1116 by Johnie Will HERO, RN Outcome: Adequate for Discharge 06/15/2024 1035 by Johnie Will HERO, RN Outcome: Progressing   Problem: Safety: Goal: Ability to remain free from injury will improve 06/15/2024 1116 by Johnie Will HERO, RN Outcome: Adequate for Discharge 06/15/2024 1035 by Johnie Will HERO, RN Outcome: Progressing   Problem: Skin Integrity: Goal: Risk for impaired skin integrity will decrease 06/15/2024 1116 by Johnie Will HERO, RN Outcome: Adequate for Discharge 06/15/2024 1035 by Johnie Will HERO, RN Outcome: Progressing   Problem: Acute Rehab PT Goals(only PT should resolve) Goal: Pt Will Go Supine/Side To Sit Outcome: Adequate for Discharge Goal: Patient Will Transfer Sit To/From Stand Outcome: Adequate for Discharge Goal: Pt Will Transfer Bed To Chair/Chair To Bed Outcome: Adequate for Discharge Goal: Pt Will Ambulate Outcome: Adequate for Discharge   Problem: Acute Rehab OT Goals (only OT should resolve) Goal: Pt. Will Perform Grooming Outcome: Adequate for Discharge Goal: Pt. Will Perform Lower Body Dressing Outcome: Adequate for Discharge Goal: Pt. Will Transfer To Toilet Outcome: Adequate for Discharge

## 2024-06-15 NOTE — Progress Notes (Signed)
 Occupational Therapy Treatment Patient Details Name: Amber Avery MRN: [id number] DOB: 01/17/1946 Today's Date: 06/15/2024   History of present illness Pt is a 78 y/o F presenting to ED with c/o weakness, HA, fever, chills. Workup for sepsis 2/2 UTI and new onset afib w/ RVR. PMH significant for asthma, T2DM, gout, HTN, dyslipidemia, OSA on CPAP.   OT comments  Pt seen for OT treatment on this date. Upon arrival to room pt seated in the recliner, agreeable to tx. Pt requires supervision for STS from recliner with minimal effort to come to upright position. Pt amb with use of quad cane into BR with steady gait, often reaching for furniture but reports she doesn't at home due to having her rollator at hand. Pt completed pericare with MODI using the grab bars to aid in lateral leaning. Pt completed sink level grooming tasks with good attention to safety. Pt returned to recliner with MODI, demonstrating dynamic standing when coordinating her lunch tray position. Pt states she feels that she is very close to her functional baseline. Pt making good progress toward goals, will continue to follow POC. Discharge recommendations have been updated to refect pt progress.        If plan is discharge home, recommend the following:  A little help with walking and/or transfers;A little help with bathing/dressing/bathroom;Help with stairs or ramp for entrance   Equipment Recommendations  BSC/3in1          Precautions / Restrictions Precautions Precautions: Fall Recall of Precautions/Restrictions: Intact Restrictions Weight Bearing Restrictions Per Provider Order: No       Mobility Bed Mobility               General bed mobility comments: NT pt in recliner pre/post session    Transfers Overall transfer level: Needs assistance Equipment used: Straight cane Transfers: Sit to/from Stand Sit to Stand: Supervision           General transfer comment: STS from standard toilet height with  use of railings and cane with supervision for safety     Balance Overall balance assessment: Needs assistance Sitting-balance support: Feet supported Sitting balance-Leahy Scale: Good Sitting balance - Comments: Steady reaching outside BOS   Standing balance support: Single extremity supported, Reliant on assistive device for balance, During functional activity Standing balance-Leahy Scale: Fair Standing balance comment: No LOB observered during amb/transfers                           ADL either performed or assessed with clinical judgement   ADL Overall ADL's : Needs assistance/impaired Eating/Feeding: Sitting;Modified independent   Grooming: Wash/dry face;Wash/dry hands;Brushing hair;Standing;Supervision/safety               Lower Body Dressing: Modified independent;Sit to/from stand   Toilet Transfer: Ambulation;Contact guard assist;Requires wide/bariatric;Grab bars   Toileting- Clothing Manipulation and Hygiene: Modified independent;Sitting/lateral lean       Functional mobility during ADLs: Supervision/safety;Modified independent;Cane General ADL Comments: Initially supervision progressing to MODI throughout session    Extremity/Trunk Assessment              Vision       Perception     Praxis     Communication Communication Communication: No apparent difficulties   Cognition Arousal: Alert Behavior During Therapy: WFL for tasks assessed/performed Cognition: No apparent impairments             OT - Cognition Comments: A/Ox4  Following commands: Intact        Cueing   Cueing Techniques: Verbal cues  Exercises Exercises: Other exercises Other Exercises Other Exercises: Edu: Role of OT tx, safe ADL completion, fall prevention during amb, DME management    Shoulder Instructions       General Comments Pt eager to go home instead of rehab, she reports she is at her baseline    Pertinent Vitals/ Pain        Pain Assessment Pain Assessment: No/denies pain  Home Living                                          Prior Functioning/Environment              Frequency  Min 3X/week        Progress Toward Goals  OT Goals(current goals can now be found in the care plan section)  Progress towards OT goals: Progressing toward goals  Acute Rehab OT Goals OT Goal Formulation: With patient Time For Goal Achievement: 06/23/24 Potential to Achieve Goals: Good ADL Goals Pt Will Perform Grooming: with modified independence;standing Pt Will Perform Lower Body Dressing: with modified independence;sit to/from stand Pt Will Transfer to Toilet: with modified independence;ambulating;regular height toilet  Plan      Co-evaluation                 AM-PAC OT 6 Clicks Daily Activity     Outcome Measure   Help from another person eating meals?: None Help from another person taking care of personal grooming?: A Little Help from another person toileting, which includes using toliet, bedpan, or urinal?: None Help from another person bathing (including washing, rinsing, drying)?: None Help from another person to put on and taking off regular upper body clothing?: None Help from another person to put on and taking off regular lower body clothing?: A Little 6 Click Score: 22    End of Session Equipment Utilized During Treatment: Other (comment) (Quad cane)  OT Visit Diagnosis: Other abnormalities of gait and mobility (R26.89);Muscle weakness (generalized) (M62.81)   Activity Tolerance Patient tolerated treatment well   Patient Left with call bell/phone within reach;in chair;with chair alarm set   Nurse Communication Mobility status        Time: 8849-8792 OT Time Calculation (min): 17 min  Charges: OT General Charges $OT Visit: 1 Visit OT Treatments $Self Care/Home Management : 8-22 mins  Larraine Colas M.S. OTR/L  06/15/24, 1:03 PM

## 2024-06-15 NOTE — TOC Progression Note (Signed)
 Transition of Care [facility]) - Progression Note    Patient Details  Name: Amber Avery MRN: [id number] Date of Birth: Jan 24, 1946  [facility]) CM/SW Contact  K'La JINNY Ruts, LCSW Phone Number: 06/15/2024, 3:12 PM  Clinical Narrative:    Chart reviewed. Patient has accepted placement at [facility]. Shara is being put into place.                    Expected Discharge Plan and Services         Expected Discharge Date: 06/15/24                                     Social Drivers of Health (SDOH) Interventions SDOH Screenings   Food Insecurity: No Food Insecurity (06/08/2024)  Recent Concern: Food Insecurity - Food Insecurity Present (05/02/2024)  Housing: Low Risk  (06/08/2024)  Transportation Needs: No Transportation Needs (06/08/2024)  Utilities: Not At Risk (06/08/2024)  Alcohol  Screen: Low Risk  (05/25/2024)  Depression (PHQ2-9): Low Risk  (05/25/2024)  Financial Resource Strain: Low Risk  (05/25/2024)  Physical Activity: Insufficiently Active (05/25/2024)  Social Connections: Socially Isolated (06/08/2024)  Stress: No Stress Concern Present (05/25/2024)  Tobacco Use: Medium Risk (06/08/2024)  Health Literacy: Adequate Health Literacy (05/25/2024)    Readmission Risk Interventions     No data to display

## 2024-06-15 NOTE — Progress Notes (Signed)
 Patient frequently ambulating to bathroom with standby assist (NO CONTACT) and cane. Patient tolerating well.

## 2024-06-15 NOTE — Progress Notes (Signed)
 PHARMACY - ANTICOAGULATION CONSULT NOTE  Pharmacy Consult for warfarin Indication: atrial fibrillation  Allergies  Allergen Reactions   Codeine Hives and Nausea And Vomiting    Patient Measurements: Height: 5' 1 (154.9 cm) Weight: 105.6 kg (232 lb 14.4 oz) IBW/kg (Calculated) : 47.8  Vital Signs: Temp: 97.6 F (36.4 C) (08/28 0448) BP: 128/70 (08/28 0448) Pulse Rate: 74 (08/28 0448)  Labs: Recent Labs    06/13/24 0435 06/14/24 0355 06/15/24 0616  HGB  --   --  12.7  HCT  --   --  39.3  PLT  --   --  331  LABPROT 27.8* 30.2* 30.2*  INR 2.5* 2.7* 2.7*  CREATININE 0.90 0.85 0.85    Estimated Creatinine Clearance: 63 mL/min (by C-G formula based on SCr of 0.85 mg/dL).   Medical History: Past Medical History:  Diagnosis Date   Allergy    Arthritis    Asthma    Diabetes mellitus without complication (HCC)    Gout    Hyperlipidemia    Hypertension    Iron  deficiency anemia 04/20/2018   Oxygen  deficiency    PONV (postoperative nausea and vomiting)    after hemorroid surgery   Shortness of breath dyspnea    Sleep apnea     Medications:  Scheduled:   allopurinol   100 mg Oral BID   atorvastatin   20 mg Oral Daily   cholecalciferol   1,000 Units Oral Daily   cyanocobalamin   500 mcg Oral Daily   diclofenac  Sodium  4 g Topical QID   diltiazem   240 mg Oral Daily   insulin  aspart  0-15 Units Subcutaneous TID WC   insulin  aspart  0-5 Units Subcutaneous QHS   insulin  aspart  3 Units Subcutaneous TID WC   insulin  glargine  10 Units Subcutaneous Q2200   loratadine   10 mg Oral Daily   metoprolol  tartrate  50 mg Oral BID   neomycin -bacitracin -polymyxin   Topical Daily   potassium chloride  SA  20 mEq Oral Daily   Warfarin - Pharmacist Dosing Inpatient   Does not apply q1600   Assessment: 78 y.o. female with medical history significant for asthma, CKD3b, type diabetes mellitus, gout, hypertension, dyslipidemia and OSA, who presented to the emergency room with sepsis  due to gram-negative UTI. She was started on Eliquis  but is concerned about the affordability. CHADSVASc 5. No Major DDI, pt completed abx.   Goal of Therapy:  INR 2-3 Monitor platelets by anticoagulation protocol: Yes  Date:  INR: Dose: 8/22 --- 7.5mg  8/23 1.4 7.5 mg 8/24 1.4 7.5 mg  8/25 1.9 7.5 mg  8/26 2.5 4 mg 8/27 2.7 4 mg 8/28 2.7  Plan:  IRN therapeutic with stable CBC. No s/sx of bleeding reported. Will continue warfarin 4mg  today x 1 dose. (Expected need ~ 5mg  daily once stable). Continue daily INR with AM labs. CBC at least every 3 days.  Keymoni Mccaster Rodriguez-Guzman PharmD, BCPS 06/15/2024 7:41 AM

## 2024-06-15 NOTE — Plan of Care (Signed)
   Problem: Education: Goal: Knowledge of General Education information will improve Description: Including pain rating scale, medication(s)/side effects and non-pharmacologic comfort measures Outcome: Progressing   Problem: Health Behavior/Discharge Planning: Goal: Ability to manage health-related needs will improve Outcome: Progressing   Problem: Activity: Goal: Risk for activity intolerance will decrease Outcome: Progressing

## 2024-06-16 DIAGNOSIS — M109 Gout, unspecified: Secondary | ICD-10-CM | POA: Diagnosis not present

## 2024-06-16 DIAGNOSIS — E785 Hyperlipidemia, unspecified: Secondary | ICD-10-CM | POA: Diagnosis not present

## 2024-06-16 DIAGNOSIS — A4150 Gram-negative sepsis, unspecified: Secondary | ICD-10-CM | POA: Diagnosis not present

## 2024-06-16 DIAGNOSIS — M6259 Muscle wasting and atrophy, not elsewhere classified, multiple sites: Secondary | ICD-10-CM | POA: Diagnosis not present

## 2024-06-16 DIAGNOSIS — N390 Urinary tract infection, site not specified: Secondary | ICD-10-CM | POA: Diagnosis not present

## 2024-06-16 DIAGNOSIS — E876 Hypokalemia: Secondary | ICD-10-CM | POA: Diagnosis not present

## 2024-06-16 DIAGNOSIS — I4891 Unspecified atrial fibrillation: Secondary | ICD-10-CM | POA: Diagnosis not present

## 2024-06-16 DIAGNOSIS — L309 Dermatitis, unspecified: Secondary | ICD-10-CM | POA: Diagnosis not present

## 2024-06-16 DIAGNOSIS — E569 Vitamin deficiency, unspecified: Secondary | ICD-10-CM | POA: Diagnosis not present

## 2024-06-16 DIAGNOSIS — R2681 Unsteadiness on feet: Secondary | ICD-10-CM | POA: Diagnosis not present

## 2024-06-16 DIAGNOSIS — E1142 Type 2 diabetes mellitus with diabetic polyneuropathy: Secondary | ICD-10-CM | POA: Diagnosis not present

## 2024-06-16 DIAGNOSIS — E1159 Type 2 diabetes mellitus with other circulatory complications: Secondary | ICD-10-CM | POA: Diagnosis not present

## 2024-06-16 DIAGNOSIS — G4700 Insomnia, unspecified: Secondary | ICD-10-CM | POA: Diagnosis not present

## 2024-06-16 DIAGNOSIS — G629 Polyneuropathy, unspecified: Secondary | ICD-10-CM | POA: Diagnosis not present

## 2024-06-16 DIAGNOSIS — E119 Type 2 diabetes mellitus without complications: Secondary | ICD-10-CM | POA: Diagnosis not present

## 2024-06-16 DIAGNOSIS — I10 Essential (primary) hypertension: Secondary | ICD-10-CM | POA: Diagnosis not present

## 2024-06-16 DIAGNOSIS — M1009 Idiopathic gout, multiple sites: Secondary | ICD-10-CM | POA: Diagnosis not present

## 2024-06-16 DIAGNOSIS — R600 Localized edema: Secondary | ICD-10-CM | POA: Diagnosis not present

## 2024-06-16 DIAGNOSIS — Z7189 Other specified counseling: Secondary | ICD-10-CM | POA: Diagnosis not present

## 2024-06-16 LAB — BASIC METABOLIC PANEL WITH GFR
Anion gap: 9 (ref 5–15)
BUN: 30 mg/dL — ABNORMAL HIGH (ref 8–23)
CO2: 23 mmol/L (ref 22–32)
Calcium: 9.3 mg/dL (ref 8.9–10.3)
Chloride: 110 mmol/L (ref 98–111)
Creatinine, Ser: 0.96 mg/dL (ref 0.44–1.00)
GFR, Estimated: >60 mL/min
Glucose, Bld: 145 mg/dL — ABNORMAL HIGH (ref 70–99)
Potassium: 3.9 mmol/L (ref 3.5–5.1)
Sodium: 142 mmol/L (ref 135–145)

## 2024-06-16 LAB — CBC WITH DIFFERENTIAL/PLATELET
Abs Immature Granulocytes: 0.07 10*3/uL (ref 0.00–0.07)
Basophils Absolute: 0.1 10*3/uL (ref 0.0–0.1)
Basophils Relative: 1 %
Eosinophils Absolute: 0.1 10*3/uL (ref 0.0–0.5)
Eosinophils Relative: 2 %
HCT: 43.1 % (ref 36.0–46.0)
Hemoglobin: 13.4 g/dL (ref 12.0–15.0)
Immature Granulocytes: 1 %
Lymphocytes Relative: 23 %
Lymphs Abs: 1.5 10*3/uL (ref 0.7–4.0)
MCH: 30.2 pg (ref 26.0–34.0)
MCHC: 31.1 g/dL (ref 30.0–36.0)
MCV: 97.3 fL (ref 80.0–100.0)
Monocytes Absolute: 1 10*3/uL (ref 0.1–1.0)
Monocytes Relative: 16 %
Neutro Abs: 3.6 10*3/uL (ref 1.7–7.7)
Neutrophils Relative %: 57 %
Platelets: 358 10*3/uL (ref 150–400)
RBC: 4.43 MIL/uL (ref 3.87–5.11)
RDW: 16.6 % — ABNORMAL HIGH (ref 11.5–15.5)
WBC: 6.3 10*3/uL (ref 4.0–10.5)
nRBC: 0 % (ref 0.0–0.2)

## 2024-06-16 LAB — PROTIME-INR
INR: 2.7 — ABNORMAL HIGH (ref 0.8–1.2)
Prothrombin Time: 29.5 s — ABNORMAL HIGH (ref 11.4–15.2)

## 2024-06-16 LAB — GLUCOSE, CAPILLARY
Glucose-Capillary: 196 mg/dL — ABNORMAL HIGH (ref 70–99)
Glucose-Capillary: 144 mg/dL — ABNORMAL HIGH (ref 70–99)

## 2024-06-16 MED ORDER — TRAMADOL HCL 50 MG PO TABS: 25.0000 mg | ORAL_TABLET | Freq: Two times a day (BID) | ORAL | 0 refills | Status: AC | PRN

## 2024-06-16 MED ORDER — WARFARIN SODIUM 5 MG PO TABS
5.0000 mg | ORAL_TABLET | Freq: Every day | ORAL | Status: DC
  Filled 2024-06-16: qty 1

## 2024-06-16 NOTE — TOC Transition Note (Signed)
 Transition of Care [facility]) - Discharge Note   Patient Details  Name: SEMAYA VIDA MRN: [id number] Date of Birth: [birth year]  [facility] CM/SW Contact:  Lauraine JAYSON Carpen, LCSW Phone Number: 06/16/2024, 1:18 PM   Clinical Narrative:  Patient has orders to discharge to [facility] today. RN will call report to [phone] (Room 612B). Sister will transport. No further concerns. CSW signing off.   Final next level of care: Skilled Nursing Facility Barriers to Discharge: Barriers Resolved   Patient Goals and CMS Choice     Choice offered to / list presented to : Patient      Discharge Placement PASRR number recieved: 06/12/24            Patient chooses bed at: [facility] [place] Patient to be transferred to facility by: Sister   Patient and family notified of of transfer: 06/16/24  Discharge Plan and Services Additional resources added to the After Visit Summary for                                       Social Drivers of Health (SDOH) Interventions SDOH Screenings   Food Insecurity: No Food Insecurity (06/08/2024)  Recent Concern: Food Insecurity - Food Insecurity Present (05/02/2024)  Housing: Low Risk  (06/08/2024)  Transportation Needs: No Transportation Needs (06/08/2024)  Utilities: Not At Risk (06/08/2024)  Alcohol  Screen: Low Risk  (05/25/2024)  Depression (PHQ2-9): Low Risk  (05/25/2024)  Financial Resource Strain: Low Risk  (05/25/2024)  Physical Activity: Insufficiently Active (05/25/2024)  Social Connections: Socially Isolated (06/08/2024)  Stress: No Stress Concern Present (05/25/2024)  Tobacco Use: Medium Risk (06/08/2024)  Health Literacy: Adequate Health Literacy (05/25/2024)     Readmission Risk Interventions     No data to display

## 2024-06-16 NOTE — Discharge Summary (Addendum)
 Physician Discharge Summary   Patient: Amber Avery MRN: [id number] DOB: 1946/03/15  Admit date:     06/06/2024  Discharge date: 06/16/24  Discharge Physician: Drue ONEIDA Potter   PCP: Sowles, Krichna, MD   Recommendations at discharge:  Follow-up with cardiology  Discharge Diagnoses:  Atrial fibrillation with rapid ventricular Resolved  Sepsis due to gram-negative UTI (HCC) Controlled type 2 diabetes mellitus without complication, without long-term current use of insulin  (HCC) Dyslipidemia CKD 3b Chornic venous insufficiency  Gout OSA  Morbid obesity  Hospital Course: [name] is a 78 y.o. obese female with medical history significant for asthma, CKD3b, type diabetes mellitus, gout, hypertension, dyslipidemia and OSA, who presented to the emergency room with acute onset of malaise and feeling sick since Tuesday with diaphoresis.  She had chills. She admits to urinary frequency and urgency without dysuria or hematuria or flank pain.  Upon admission patient was found to have sepsis due to gram-negative UTI and completed antibiotic therapy.  Also had A-fib with rapid ventricular response and was seen by cardiologist.  She has improved to her baseline condition and have subsequently been cleared for discharge today and will follow-up as an outpatient.   Consultants: Cardiology Procedures performed: None Disposition: Skilled nursing facility Diet recommendation:  Cardiac diet DISCHARGE MEDICATION: Allergies as of 06/16/2024       Reactions   Codeine Hives, Nausea And Vomiting        Medication List     TAKE these medications    acetaminophen  500 MG tablet Commonly known as: TYLENOL  Take 1 tablet (500 mg total) by mouth every 6 (six) hours as needed. What changed:  how much to take when to take this   albuterol  108 (90 Base) MCG/ACT inhaler Commonly known as: VENTOLIN  HFA Inhale 2 puffs into the lungs every 6 (six) hours as needed for wheezing or shortness of  breath.   allopurinol  100 MG tablet Commonly known as: ZYLOPRIM  Take 1 tablet (100 mg total) by mouth 2 (two) times daily.   atorvastatin  20 MG tablet Commonly known as: LIPITOR TAKE 1 TABLET BY MOUTH DAILY FOR CHOLESTEROL   Blood Pressure Kit 1 each by Does not apply route daily.   clobetasol  ointment 0.05 % Commonly known as: TEMOVATE  Apply 1 Application topically at bedtime. Apply to the affected area nightly for 4 weeks.   dapagliflozin  propanediol 10 MG Tabs tablet Commonly known as: Farxiga  Take 1 tablet (10 mg total) by mouth daily before breakfast.   diltiazem  240 MG 24 hr capsule Commonly known as: CARDIZEM  CD Take 1 capsule (240 mg total) by mouth daily.   furosemide  20 MG tablet Commonly known as: LASIX  Take 1 tablet (20 mg total) by mouth 2 (two) times daily. Second dose around noon   gabapentin  300 MG capsule Commonly known as: NEURONTIN  TAKE 1 CAPSULE BY MOUTH IN THE MORNING, 1 CAPSULE IN THE EVENING AND 2 CAPSULES AT NIGHT   metFORMIN  750 MG 24 hr tablet Commonly known as: GLUCOPHAGE -XR Take 1 tablet (750 mg total) by mouth daily with breakfast.   methocarbamol  500 MG tablet Commonly known as: ROBAXIN  Take 500 mg by mouth 3 (three) times daily.   metoprolol  tartrate 50 MG tablet Commonly known as: LOPRESSOR  Take 1 tablet (50 mg total) by mouth 2 (two) times daily.   onetouch ultrasoft lancets Use as instructed   OneTouch Verio test strip Generic drug: glucose blood USE AS DIRECTED   polyethylene glycol 17 g packet Commonly known as: MIRALAX  / GLYCOLAX   Take 17 g by mouth daily as needed for mild constipation.   potassium chloride  SA 20 MEQ tablet Commonly known as: KLOR-CON  M TAKE 1 TABLET BY MOUTH DAILY   traMADol  50 MG tablet Commonly known as: ULTRAM  Take 0.5-1 tablets (25-50 mg total) by mouth 2 (two) times daily as needed.   traZODone  50 MG tablet Commonly known as: DESYREL  Take 0.5 tablets (25 mg total) by mouth at bedtime as  needed for sleep.   triamcinolone  ointment 0.1 % Commonly known as: KENALOG  APPLY TOPICALLY TWICE DAILY AS NEEDED. GENERIC EQUIVALENT FOR KENALOG    Trulicity  4.5 MG/0.5ML Soaj Generic drug: Dulaglutide  Inject 4.5 mg as directed once a week. What changed: how much to take   vitamin B-12 500 MCG tablet Commonly known as: CYANOCOBALAMIN  Take 500 mcg by mouth daily.   Vitamin D  (Cholecalciferol ) 25 MCG (1000 UT) Tabs Take 1 capsule by mouth daily.   Voltaren  1 % Gel Generic drug: diclofenac  Sodium Apply 4 g topically 4 (four) times daily.   warfarin 5 MG tablet Commonly known as: COUMADIN  Take 1 tablet (5 mg total) by mouth daily.        Contact information for follow-up providers     Alluri, Krishna C, MD. Go in 1 week(s).   Specialty: Cardiology Contact information: [address] [place] [address] [phone]              Contact information for after-discharge care     Destination     [facility] [place], [place]. [place]   Service: Skilled Nursing Contact information: [address] [place]  [address] [phone]                    Discharge Exam: Filed Weights   06/08/24 1450 06/12/24 0522  Weight: 114.4 kg 105.6 kg   Constitutional: In no distress.  Cardiovascular: Normal rate, regular rhythm. 1+ bilateral lower extremity edema  Pulmonary: Non labored breathing on room air, no wheezing or rales.   Abdominal: Soft. Non distended and non tender Musculoskeletal: Normal range of motion.     Neurological: Alert and oriented to person, place, and time. Non focal  Skin: Skin is warm and dry.     Condition at discharge: good  The results of significant diagnostics from this hospitalization (including imaging, microbiology, ancillary and laboratory) are listed below for reference.   Imaging Studies: ECHOCARDIOGRAM COMPLETE Result Date: 06/07/2024    ECHOCARDIOGRAM REPORT   Patient Name:   Amber Avery Date of Exam:  06/07/2024 Medical Rec #:  [id number]       Height:       61.0 in Accession #:    [id number]      Weight:       247.8 lb Date of Birth:  01-Jun-1946       BSA:          2.069 m Patient Age:    77 years        BP:           137/103 mmHg Patient Gender: F               HR:           109 bpm. Exam Location:  [facility] Procedure: 2D Echo, Color Doppler and Cardiac Doppler (Both Spectral and Color            Flow Doppler were utilized during procedure). Indications:     Atrial Fibrillation I48.91  History:  Patient has no prior history of Echocardiogram examinations.                  Risk Factors:Hypertension, Diabetes and Sleep Apnea.  Sonographer:     Christopher Furnace Referring Phys:  [id number] JAN A MANSY Diagnosing Phys: Dwayne D Callwood MD  Sonographer Comments: Technically challenging study due to limited acoustic windows. Image acquisition challenging due to patient body habitus. IMPRESSIONS  1. Left ventricular ejection fraction, by estimation, is 50 to 55%. The left ventricle has low normal function. The left ventricle has no regional wall motion abnormalities. Left ventricular diastolic function could not be evaluated.  2. Right ventricular systolic function is low normal. The right ventricular size is mildly enlarged.  3. The mitral valve is normal in structure. Trivial mitral valve regurgitation.  4. The aortic valve is normal in structure. Aortic valve regurgitation is not visualized. FINDINGS  Left Ventricle: Left ventricular ejection fraction, by estimation, is 50 to 55%. The left ventricle has low normal function. The left ventricle has no regional wall motion abnormalities. Strain was performed and the global longitudinal strain is indeterminate. The left ventricular internal cavity size was normal in size. There is borderline left ventricular hypertrophy. Left ventricular diastolic function could not be evaluated. Right Ventricle: The right ventricular size is mildly enlarged. No increase in right ventricular  wall thickness. Right ventricular systolic function is low normal. Left Atrium: Left atrial size was normal in size. Right Atrium: Right atrial size was normal in size. Pericardium: There is no evidence of pericardial effusion. Mitral Valve: The mitral valve is normal in structure. Trivial mitral valve regurgitation. Tricuspid Valve: The tricuspid valve is normal in structure. Tricuspid valve regurgitation is mild. Aortic Valve: The aortic valve is normal in structure. Aortic valve regurgitation is not visualized. Aortic valve mean gradient measures 7.0 mmHg. Aortic valve peak gradient measures 12.5 mmHg. Aortic valve area, by VTI measures 2.24 cm. Pulmonic Valve: The pulmonic valve was normal in structure. Pulmonic valve regurgitation is not visualized. Aorta: The ascending aorta was not well visualized. IAS/Shunts: No atrial level shunt detected by color flow Doppler. Additional Comments: 3D was performed not requiring image post processing on an independent workstation and was indeterminate.  LEFT VENTRICLE PLAX 2D LVIDd:         4.20 cm LVIDs:         3.10 cm LV PW:         1.20 cm LV IVS:        0.90 cm LVOT diam:     2.00 cm LV SV:         51 LV SV Index:   25 LVOT Area:     3.14 cm  RIGHT VENTRICLE RV Basal diam:  3.50 cm RV Mid diam:    2.90 cm RV S prime:     11.70 cm/s TAPSE (M-mode): 2.1 cm LEFT ATRIUM             Index        RIGHT ATRIUM           Index LA diam:        3.60 cm 1.74 cm/m   RA Area:     15.10 cm LA Vol (A2C):   62.0 ml 29.97 ml/m  RA Volume:   37.70 ml  18.22 ml/m LA Vol (A4C):   70.8 ml 34.22 ml/m LA Biplane Vol: 66.3 ml 32.05 ml/m  AORTIC VALVE AV Area (Vmax):    2.15 cm AV Area (Vmean):  2.20 cm AV Area (VTI):     2.24 cm AV Vmax:           177.00 cm/s AV Vmean:          122.000 cm/s AV VTI:            0.229 m AV Peak Grad:      12.5 mmHg AV Mean Grad:      7.0 mmHg LVOT Vmax:         121.00 cm/s LVOT Vmean:        85.500 cm/s LVOT VTI:          0.163 m LVOT/AV VTI ratio:  0.71  AORTA Ao Root diam: 2.70 cm MITRAL VALVE                TRICUSPID VALVE MV Area (PHT): 4.41 cm     TR Peak grad:   19.0 mmHg MV Decel Time: 172 msec     TR Vmax:        218.00 cm/s MV E velocity: 138.00 cm/s                             SHUNTS                             Systemic VTI:  0.16 m                             Systemic Diam: 2.00 cm Cara JONETTA Lovelace MD Electronically signed by Cara JONETTA Lovelace MD Signature Date/Time: 06/07/2024/5:10:06 PM    Final    CT Angio Chest PE W and/or Wo Contrast Result Date: 06/07/2024 CLINICAL DATA:  Pulmonary embolism (PE) suspected, low to intermediate prob, positive D-dimer. Suspected sepsis. EXAM: CT ANGIOGRAPHY CHEST WITH CONTRAST TECHNIQUE: Multidetector CT imaging of the chest was performed using the standard protocol during bolus administration of intravenous contrast. Multiplanar CT image reconstructions and MIPs were obtained to evaluate the vascular anatomy. RADIATION DOSE REDUCTION: This exam was performed according to the departmental dose-optimization program which includes automated exposure control, adjustment of the mA and/or kV according to patient size and/or use of iterative reconstruction technique. CONTRAST:  75mL OMNIPAQUE  IOHEXOL  350 MG/ML SOLN COMPARISON:  12/23/2014 FINDINGS: Cardiovascular: No filling defects in the pulmonary arteries to suggest pulmonary emboli. Heart is normal size. Aorta is normal caliber. Scattered coronary artery and aortic calcifications. Mediastinum/Nodes: No mediastinal, hilar, or axillary adenopathy. Trachea and esophagus are unremarkable. Multiple calcified nodules in the thyroid  are unchanged since prior study. Lungs/Pleura: No confluent airspace opacities or effusions. Upper Abdomen: No acute findings Musculoskeletal: Chest wall soft tissues are unremarkable. No acute bony abnormality. Review of the MIP images confirms the above findings. IMPRESSION: No evidence of pulmonary embolus. Coronary artery disease. No  acute cardiopulmonary disease. Aortic Atherosclerosis (ICD10-I70.0). Electronically Signed   By: Franky Crease M.D.   On: 06/07/2024 01:01   CT ABDOMEN PELVIS W CONTRAST Result Date: 06/07/2024 CLINICAL DATA:  Sepsis EXAM: CT ABDOMEN AND PELVIS WITH CONTRAST TECHNIQUE: Multidetector CT imaging of the abdomen and pelvis was performed using the standard protocol following bolus administration of intravenous contrast. RADIATION DOSE REDUCTION: This exam was performed according to the departmental dose-optimization program which includes automated exposure control, adjustment of the mA and/or kV according to patient size and/or use of iterative reconstruction technique. CONTRAST:  75mL OMNIPAQUE  IOHEXOL   350 MG/ML SOLN COMPARISON:  None Available. FINDINGS: Lower chest: No acute abnormality Hepatobiliary: No focal liver abnormality is seen. Status post cholecystectomy. No biliary dilatation. Pancreas: No focal abnormality or ductal dilatation. Spleen: No focal abnormality.  Normal size. Adrenals/Urinary Tract: Adrenal glands normal. No renal mass, stone or hydronephrosis. Nonspecific bilateral perinephric stranding. Urinary bladder decompressed, grossly unremarkable. Stomach/Bowel: Left colonic diverticulosis. No active diverticulitis. Stomach and small bowel decompressed. No bowel obstruction or inflammatory process. Vascular/Lymphatic: No evidence of aneurysm or adenopathy. Reproductive: 6 cm partially calcified left uterine fibroid. 2.8 cm cyst in the left ovary. Right ovary unremarkable. Other: No free fluid or free air. Musculoskeletal: No acute bony abnormality. IMPRESSION: No acute findings in the abdomen or pelvis. Nonspecific bilateral perinephric stranding which may be related to remote insult. No hydronephrosis or stones. Aortic atherosclerosis. Left colonic diverticulosis. Electronically Signed   By: Franky Crease M.D.   On: 06/07/2024 00:59   CT HEAD WO CONTRAST ( ) Result Date: 06/06/2024 CLINICAL  DATA:  Headache, new onset (Age >= 51y) generalized weakness and headache EXAM: CT HEAD WITHOUT CONTRAST TECHNIQUE: Contiguous axial images were obtained from the base of the skull through the vertex without intravenous contrast. RADIATION DOSE REDUCTION: This exam was performed according to the departmental dose-optimization program which includes automated exposure control, adjustment of the mA and/or kV according to patient size and/or use of iterative reconstruction technique. COMPARISON:  CT head 12/23/2014 FINDINGS: Brain: No evidence of large-territorial acute infarction. No parenchymal hemorrhage. No mass lesion. No extra-axial collection. No mass effect or midline shift. No hydrocephalus. Basilar cisterns are patent. Vascular: No hyperdense vessel. Atherosclerotic calcifications are present within the cavernous internal carotid arteries. Skull: No acute fracture or focal lesion. Sinuses/Orbits: Paranasal sinuses and mastoid air cells are clear. Bilateral lens replacement. Otherwise the orbits are unremarkable. Other: None. IMPRESSION: No acute intracranial abnormality. Electronically Signed   By: Morgane  Naveau M.D.   On: 06/06/2024 23:35   DG Chest Port 1 View if patient is in a treatment room. Result Date: 06/06/2024 CLINICAL DATA:  Suspected sepsis, former smoker, asthma EXAM: PORTABLE CHEST 1 VIEW COMPARISON:  12/23/2014 FINDINGS: Stable cardiomediastinal silhouette. Aortic atherosclerotic calcification. Chronic interstitial coarsening. No focal consolidation, pleural effusion, or pneumothorax. No displaced rib fractures. IMPRESSION: Chronic interstitial coarsening.  No acute abnormality. Electronically Signed   By: Norman Gatlin M.D.   On: 06/06/2024 23:20    Microbiology: Results for orders placed or performed during the hospital encounter of 06/06/24  Culture, blood (Routine x 2)     Status: None   Collection Time: 06/06/24 11:00 PM   Specimen: BLOOD  Result Value Ref Range Status    Specimen Description BLOOD LEFT ANTECUBITAL  Final   Special Requests   Final    BOTTLES DRAWN AEROBIC AND ANAEROBIC Blood Culture results may not be optimal due to an excessive volume of blood received in culture bottles   Culture   Final    NO GROWTH 5 DAYS Performed at [facility], [address], [place], [place] [address]    Report Status 06/11/2024 FINAL  Final  Urine Culture     Status: Abnormal   Collection Time: 06/06/24 11:00 PM   Specimen: Urine, Random  Result Value Ref Range Status   Specimen Description   Final    URINE, RANDOM Performed at [facility], [address], [place], [place] [address]    Special Requests   Final    NONE Reflexed from [id number] Performed at [facility], [address] [place], [place] [address]    Culture >=100,000 COLONIES/mL KLEBSIELLA PNEUMONIAE (A)  Final   Report Status 06/09/2024 FINAL  Final   Organism ID, Bacteria KLEBSIELLA PNEUMONIAE (A)  Final      Susceptibility   Klebsiella pneumoniae - MIC*    AMPICILLIN >=32 RESISTANT Resistant     CEFAZOLIN (URINE) Value in next row Sensitive      2 SENSITIVEThis is a modified FDA-approved test that has been validated and its performance characteristics determined by the reporting laboratory.  This laboratory is certified under the Clinical Laboratory Improvement Amendments CLIA as qualified to perform high complexity clinical laboratory testing.    CEFEPIME  Value in next row Sensitive      2 SENSITIVEThis is a modified FDA-approved test that has been validated and its performance characteristics determined by the reporting laboratory.  This laboratory is certified under the Clinical Laboratory Improvement Amendments CLIA as qualified to perform high complexity clinical laboratory testing.    ERTAPENEM Value in next row Sensitive      2 SENSITIVEThis is a modified FDA-approved test that has been validated and its performance characteristics determined by the  reporting laboratory.  This laboratory is certified under the Clinical Laboratory Improvement Amendments CLIA as qualified to perform high complexity clinical laboratory testing.    CEFTRIAXONE  Value in next row Sensitive      2 SENSITIVEThis is a modified FDA-approved test that has been validated and its performance characteristics determined by the reporting laboratory.  This laboratory is certified under the Clinical Laboratory Improvement Amendments CLIA as qualified to perform high complexity clinical laboratory testing.    CIPROFLOXACIN Value in next row Sensitive      2 SENSITIVEThis is a modified FDA-approved test that has been validated and its performance characteristics determined by the reporting laboratory.  This laboratory is certified under the Clinical Laboratory Improvement Amendments CLIA as qualified to perform high complexity clinical laboratory testing.    GENTAMICIN Value in next row Sensitive      2 SENSITIVEThis is a modified FDA-approved test that has been validated and its performance characteristics determined by the reporting laboratory.  This laboratory is certified under the Clinical Laboratory Improvement Amendments CLIA as qualified to perform high complexity clinical laboratory testing.    NITROFURANTOIN Value in next row Intermediate      2 SENSITIVEThis is a modified FDA-approved test that has been validated and its performance characteristics determined by the reporting laboratory.  This laboratory is certified under the Clinical Laboratory Improvement Amendments CLIA as qualified to perform high complexity clinical laboratory testing.    TRIMETH/SULFA Value in next row Sensitive      2 SENSITIVEThis is a modified FDA-approved test that has been validated and its performance characteristics determined by the reporting laboratory.  This laboratory is certified under the Clinical Laboratory Improvement Amendments CLIA as qualified to perform high complexity clinical  laboratory testing.    AMPICILLIN/SULBACTAM Value in next row Sensitive      2 SENSITIVEThis is a modified FDA-approved test that has been validated and its performance characteristics determined by the reporting laboratory.  This laboratory is certified under the Clinical Laboratory Improvement Amendments CLIA as qualified to perform high complexity clinical laboratory testing.    PIP/TAZO Value in next row Sensitive ug/mL     <=4 SENSITIVEThis is a modified FDA-approved test that has been validated and its performance characteristics determined by the reporting laboratory.  This laboratory is certified under the Clinical Laboratory Improvement Amendments  CLIA as qualified to perform high complexity clinical laboratory testing.    MEROPENEM Value in next row Sensitive      <=4 SENSITIVEThis is a modified FDA-approved test that has been validated and its performance characteristics determined by the reporting laboratory.  This laboratory is certified under the Clinical Laboratory Improvement Amendments CLIA as qualified to perform high complexity clinical laboratory testing.    * >=100,000 COLONIES/mL KLEBSIELLA PNEUMONIAE  Culture, blood (Routine x 2)     Status: None   Collection Time: 06/06/24 11:20 PM   Specimen: BLOOD  Result Value Ref Range Status   Specimen Description BLOOD BLOOD LEFT ARM  Final   Special Requests   Final    BOTTLES DRAWN AEROBIC AND ANAEROBIC Blood Culture adequate volume   Culture   Final    NO GROWTH 5 DAYS Performed at [facility], [address] [place], [place] [address]    Report Status 06/11/2024 FINAL  Final    Labs: CBC: Recent Labs  Lab 06/12/24 0539 06/15/24 0616 06/16/24 0716  WBC 10.1 6.0 6.3  NEUTROABS  --  3.3 3.6  HGB 13.4 12.7 13.4  HCT 41.5 39.3 43.1  MCV 94.5 95.6 97.3  PLT 361 331 358   Basic Metabolic Panel: Recent Labs  Lab 06/10/24 0532 06/11/24 0636 06/13/24 0435 06/14/24 0355 06/15/24 0616 06/16/24 0716  NA  138 141 146* 143 142 142  K 4.1 4.3 4.3 4.3 4.1 3.9  CL 109 112* 115* 114* 113* 110  CO2 23 21* 25 24 22 23   GLUCOSE 171* 162* 154* 154* 152* 145*  BUN 50* 46* 33* 29* 31* 30*  CREATININE 1.20* 0.98 0.90 0.85 0.85 0.96  CALCIUM  8.9 9.1 9.4 9.3 9.1 9.3  MG 2.5* 2.4 1.9 1.7  --   --    Liver Function Tests: No results for input(s): AST, ALT, ALKPHOS, BILITOT, PROT, ALBUMIN in the last 168 hours. CBG: Recent Labs  Lab 06/15/24 1208 06/15/24 1623 06/15/24 2027 06/16/24 0903 06/16/24 1101  GLUCAP 118* 139* 158* 196* 144*    Discharge time spent:  35 minutes.  Signed: Drue ONEIDA Potter, MD [facility] 06/16/2024

## 2024-06-16 NOTE — Discharge Instructions (Signed)

## 2024-06-16 NOTE — Progress Notes (Signed)
 Gave report to Ozell LPN at [facility] [employer]  Volanda Beverley BIRCH, RN

## 2024-06-16 NOTE — TOC Progression Note (Addendum)
 Transition of Care [facility]) - Progression Note    Patient Details  Name: RYVER ZADROZNY MRN: [id number] Date of Birth: [birth year]  Transition of [facility]) CM/SW Contact  Lauraine JAYSON Carpen, LCSW Phone Number: 06/16/2024, 9:45 AM  Clinical Narrative:   Shara approved. Reference # [id number]. Valid 8/28-9/2. Left message for [facility] admissions coordinator to notify.  11:28 am: Per yesterday's TOC, insurance called and said patient is not in network with [facility] so [facility] chose [facility]. Authorization is for [facility]. They are seeing if they can take her today. Sister is at bedside to transport.  12:42 pm: [facility] can accept patient today. Asked MD to complete discharge summary.  Expected Discharge Plan and Services         Expected Discharge Date: 06/15/24                                     Social Drivers of Health (SDOH) Interventions SDOH Screenings   Food Insecurity: No Food Insecurity (06/08/2024)  Recent Concern: Food Insecurity - Food Insecurity Present (05/02/2024)  Housing: Low Risk  (06/08/2024)  Transportation Needs: No Transportation Needs (06/08/2024)  Utilities: Not At Risk (06/08/2024)  Alcohol  Screen: Low Risk  (05/25/2024)  Depression (PHQ2-9): Low Risk  (05/25/2024)  Financial Resource Strain: Low Risk  (05/25/2024)  Physical Activity: Insufficiently Active (05/25/2024)  Social Connections: Socially Isolated (06/08/2024)  Stress: No Stress Concern Present (05/25/2024)  Tobacco Use: Medium Risk (06/08/2024)  Health Literacy: Adequate Health Literacy (05/25/2024)    Readmission Risk Interventions     No data to display

## 2024-06-16 NOTE — Progress Notes (Signed)
 Physical Therapy Treatment Patient Details Name: Amber Avery MRN: [id number] DOB: [birth year] Today's Date: 06/16/2024   History of Present Illness Pt is a 78 y/o F presenting to ED with c/o weakness, HA, fever, chills. Workup for sepsis 2/2 UTI and new onset afib w/ RVR. PMH significant for asthma, T2DM, gout, HTN, dyslipidemia, OSA on CPAP.    PT Comments  Pt A&Ox4, agreeable to participate in PT. Pt denied pain throughout session. Pt was met sitting in recliner, able to complete STS transfers with [facility] and CGA. Pt amb ~144ft over 2 bouts, required intermittent standing rest breaks 2/2 dyspnea on exertion. HR 95bpm after initial bout of amb, decreased to 80-82bpm at end of session. SpO2 remained 97% throughout with pt on RA. Pt was left in recliner with all needs in reach. Pt continues to display deficits in activity tolerance and endurance and would benefit from further skilled PT intervention to improve her endurance capacity and allow for safe return to PLOF.     If plan is discharge home, recommend the following: A little help with walking and/or transfers;A little help with bathing/dressing/bathroom;Assistance with cooking/housework;Assist for transportation;Help with stairs or ramp for entrance   Can travel by private vehicle     Yes  Equipment Recommendations  Other (comment) (defer to next venue of care)    Recommendations for Other Services       Precautions / Restrictions Precautions Precautions: Fall Restrictions Weight Bearing Restrictions Per Provider Order: No     Mobility  Bed Mobility               General bed mobility comments: not assessed this session    Transfers Overall transfer level: Needs assistance Equipment used: Straight cane Transfers: Sit to/from Stand Sit to Stand: Contact guard assist           General transfer comment: STS from recliner with SPC and CGA    Ambulation/Gait Ambulation/Gait assistance: Contact guard assist Gait  Distance (Feet): 100 Feet (over 2 bouts) Assistive device: Straight cane Gait Pattern/deviations: Step-through pattern, Wide base of support Gait velocity: decreased     General Gait Details: decreased cadence, noticeable fatigue/dyspnea on exertion. Required 2 standing rest breaks during amb   Stairs             Wheelchair Mobility     Tilt Bed    Modified Rankin (Stroke Patients Only)       Balance Overall balance assessment: Needs assistance Sitting-balance support: Feet supported Sitting balance-Leahy Scale: Good     Standing balance support: Single extremity supported Standing balance-Leahy Scale: Fair Standing balance comment: no LOB, mild path variance                            Communication Communication Communication: No apparent difficulties  Cognition Arousal: Alert Behavior During Therapy: WFL for tasks assessed/performed   PT - Cognitive impairments: No apparent impairments                       PT - Cognition Comments: A&Ox4 Following commands: Intact      Cueing Cueing Techniques: Verbal cues  Exercises Other Exercises Other Exercises: HR 95bpm after initial bout of amb, 80-82bpm at end of session. SpO2 remained 97% throughout with pt on RA    General Comments        Pertinent Vitals/Pain Pain Assessment Pain Assessment: No/denies pain    Home Living  Prior Function            PT Goals (current goals can now be found in the care plan section) Progress towards PT goals: Progressing toward goals    Frequency    Min 2X/week      PT Plan      Co-evaluation              AM-PAC PT 6 Clicks Mobility   Outcome Measure  Help needed turning from your back to your side while in a flat bed without using bedrails?: A Little Help needed moving from lying on your back to sitting on the side of a flat bed without using bedrails?: A Little Help needed moving to and  from a bed to a chair (including a wheelchair)?: A Little Help needed standing up from a chair using your arms (e.g., wheelchair or bedside chair)?: A Little Help needed to walk in hospital room?: A Little Help needed climbing 3-5 steps with a railing? : A Lot 6 Click Score: 17    End of Session Equipment Utilized During Treatment: Gait belt Activity Tolerance: Patient limited by fatigue Patient left: in chair;with call bell/phone within reach;with chair alarm set Nurse Communication: Mobility status PT Visit Diagnosis: Other abnormalities of gait and mobility (R26.89);Muscle weakness (generalized) (M62.81);History of falling (Z91.81);Difficulty in walking, not elsewhere classified (R26.2)     Time: 9091-9076 PT Time Calculation (min) (ACUTE ONLY): 15 min  Charges:    $Therapeutic Activity: 8-22 mins PT General Charges $$ ACUTE PT VISIT: 1 Visit                     Bayyinah Dukeman, SPT

## 2024-06-16 NOTE — Progress Notes (Signed)
 PHARMACY - ANTICOAGULATION CONSULT NOTE  Pharmacy Consult for warfarin Indication: atrial fibrillation  Allergies  Allergen Reactions   Codeine Hives and Nausea And Vomiting    Patient Measurements: Height: 5' 1 (154.9 cm) Weight: 105.6 kg (232 lb 14.4 oz) IBW/kg (Calculated) : 47.8  Vital Signs: Temp: 97.9 F (36.6 C) (08/29 0427) BP: 96/62 (08/29 0427) Pulse Rate: 54 (08/29 0427)  Labs: Recent Labs    06/14/24 0355 06/15/24 0616  HGB  --  12.7  HCT  --  39.3  PLT  --  331  LABPROT 30.2* 30.2*  INR 2.7* 2.7*  CREATININE 0.85 0.85    Estimated Creatinine Clearance: 63 mL/min (by C-G formula based on SCr of 0.85 mg/dL).   Medical History: Past Medical History:  Diagnosis Date   Allergy    Arthritis    Asthma    Diabetes mellitus without complication (HCC)    Gout    Hyperlipidemia    Hypertension    Iron  deficiency anemia 04/20/2018   Oxygen  deficiency    PONV (postoperative nausea and vomiting)    after hemorroid surgery   Shortness of breath dyspnea    Sleep apnea     Medications:  Scheduled:   allopurinol   100 mg Oral BID   atorvastatin   20 mg Oral Daily   cholecalciferol   1,000 Units Oral Daily   cyanocobalamin   500 mcg Oral Daily   diclofenac  Sodium  4 g Topical QID   diltiazem   240 mg Oral Daily   insulin  aspart  0-15 Units Subcutaneous TID WC   insulin  aspart  0-5 Units Subcutaneous QHS   insulin  aspart  3 Units Subcutaneous TID WC   insulin  glargine  10 Units Subcutaneous Q2200   loratadine   10 mg Oral Daily   metoprolol  tartrate  50 mg Oral BID   neomycin -bacitracin -polymyxin   Topical Daily   potassium chloride  SA  20 mEq Oral Daily   Warfarin - Pharmacist Dosing Inpatient   Does not apply q1600   Assessment: 78 y.o. female with medical history significant for asthma, CKD3b, type diabetes mellitus, gout, hypertension, dyslipidemia and OSA, who presented to the emergency room with sepsis due to gram-negative UTI. She was started on  Eliquis  but is concerned about the affordability. CHADSVASc 5. No Major DDI, pt completed abx.   Goal of Therapy:  INR 2-3 Monitor platelets by anticoagulation protocol: Yes  Date:  INR: Dose: 8/22 --- 7.5mg  8/23 1.4 7.5 mg 8/24 1.4 7.5 mg  8/25 1.9 7.5 mg  8/26 2.5 4 mg 8/27 2.7 4 mg 8/28 2.7 4mg  8/29 --- Start 5mg  daily   Plan:  NO INR this morning. Patient on with discharge orders and all labs dc on 8/28. Will start warfarin 5mg  po daily and repeat INR with AM labs if patient remain inpatient.  Kermit Arnette Rodriguez-Guzman PharmD, BCPS 06/16/2024 7:23 AM

## 2024-06-17 DIAGNOSIS — Z7189 Other specified counseling: Secondary | ICD-10-CM | POA: Diagnosis not present

## 2024-06-19 DIAGNOSIS — E119 Type 2 diabetes mellitus without complications: Secondary | ICD-10-CM | POA: Diagnosis not present

## 2024-06-19 DIAGNOSIS — I4891 Unspecified atrial fibrillation: Secondary | ICD-10-CM | POA: Diagnosis not present

## 2024-06-19 DIAGNOSIS — M1009 Idiopathic gout, multiple sites: Secondary | ICD-10-CM | POA: Diagnosis not present

## 2024-06-19 DIAGNOSIS — R600 Localized edema: Secondary | ICD-10-CM | POA: Diagnosis not present

## 2024-06-19 DIAGNOSIS — E1142 Type 2 diabetes mellitus with diabetic polyneuropathy: Secondary | ICD-10-CM | POA: Diagnosis not present

## 2024-06-21 DIAGNOSIS — E1159 Type 2 diabetes mellitus with other circulatory complications: Secondary | ICD-10-CM | POA: Diagnosis not present

## 2024-06-21 DIAGNOSIS — I10 Essential (primary) hypertension: Secondary | ICD-10-CM | POA: Diagnosis not present

## 2024-06-21 DIAGNOSIS — I4891 Unspecified atrial fibrillation: Secondary | ICD-10-CM | POA: Diagnosis not present

## 2024-06-23 ENCOUNTER — Telehealth: Payer: Self-pay

## 2024-06-23 NOTE — Telephone Encounter (Signed)
 Copied from CRM [id number]. Topic: General - Other >> Jun 23, 2024  3:14 PM Dedra B wrote: Reason for CRM: Soni from [facility] called to let provider know that pt has declined the need for home health PT services. Soni can be reached at [phone]

## 2024-06-26 ENCOUNTER — Encounter: Payer: Self-pay | Admitting: Family Medicine

## 2024-06-26 ENCOUNTER — Ambulatory Visit (INDEPENDENT_AMBULATORY_CARE_PROVIDER_SITE_OTHER): Admitting: Family Medicine

## 2024-06-26 VITALS — BP 116/72 | HR 78 | Resp 18 | Ht 61.0 in | Wt 249.0 lb

## 2024-06-26 DIAGNOSIS — A4150 Gram-negative sepsis, unspecified: Secondary | ICD-10-CM | POA: Diagnosis not present

## 2024-06-26 DIAGNOSIS — J069 Acute upper respiratory infection, unspecified: Secondary | ICD-10-CM | POA: Diagnosis not present

## 2024-06-26 DIAGNOSIS — N183 Chronic kidney disease, stage 3 unspecified: Secondary | ICD-10-CM | POA: Diagnosis not present

## 2024-06-26 DIAGNOSIS — N390 Urinary tract infection, site not specified: Secondary | ICD-10-CM | POA: Diagnosis not present

## 2024-06-26 DIAGNOSIS — I4891 Unspecified atrial fibrillation: Secondary | ICD-10-CM

## 2024-06-26 DIAGNOSIS — Z09 Encounter for follow-up examination after completed treatment for conditions other than malignant neoplasm: Secondary | ICD-10-CM

## 2024-06-26 DIAGNOSIS — E1122 Type 2 diabetes mellitus with diabetic chronic kidney disease: Secondary | ICD-10-CM

## 2024-06-26 LAB — POCT INR: INR: 1.6 — AB (ref 2.0–3.0)

## 2024-06-26 MED ORDER — BLOOD GLUCOSE TEST VI STRP: 1.0000 | ORAL_STRIP | Freq: Every day | 2 refills | Status: AC

## 2024-06-26 MED ORDER — WARFARIN SODIUM 5 MG PO TABS: 5.0000 mg | ORAL_TABLET | Freq: Every day | ORAL | 0 refills | Status: DC

## 2024-06-26 MED ORDER — DILTIAZEM HCL ER COATED BEADS 240 MG PO CP24: 240.0000 mg | ORAL_CAPSULE | Freq: Every day | ORAL | 0 refills | Status: DC

## 2024-06-26 MED ORDER — BLOOD GLUCOSE MONITORING SUPPL DEVI: 1.0000 | Freq: Three times a day (TID) | 0 refills | Status: AC

## 2024-06-26 MED ORDER — LANCETS MISC. MISC: 1.0000 | Freq: Every day | 2 refills | Status: AC

## 2024-06-26 MED ORDER — LANCET DEVICE MISC: 1.0000 | 0 refills | Status: AC

## 2024-06-26 NOTE — Patient Instructions (Signed)
 INR-too low, she needs to take two on Wednesday and Friday and follow up with cardiologist on Monday

## 2024-06-26 NOTE — Progress Notes (Addendum)
 Name: Amber Avery   MRN: [id number]    DOB: 09-02-1946   Date:06/26/2024       Progress Note  Subjective  Chief Complaint  Chief Complaint  Patient presents with   Hospitalization Follow-up    Sepsis due to gram-negative UTI, pt feeling better   Discussed the use of AI scribe software for clinical note transcription with the patient, who gave verbal consent to proceed.  History of Present Illness Amber Avery is a 78 year old female with sepsis and atrial fibrillation who presents for follow-up after hospitalization.  She was hospitalized from August until August 29th for sepsis due to a gram-negative urinary tract infection and an episode of atrial fibrillation with rapid ventricular response. She initially felt unwell for a week prior to her admission and was unable to get up and go to the bathroom by herself.   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 She is also taking Cardizem , with a few doses left, as well as Trulicity  4.5 mg, metformin , metoprolol , tramadol , and methocarbamol . She has discontinued trazodone , which was given to her during hospital stay to assist with sleep   She experienced a recent cold that began with a sore throat while in rehab, approximately ten days ago, accompanied by coughing and phlegm, which have since improved. She has been using Coricidin HBP and saline spray for symptom relief. No recent urinary frequency or burning, although she experienced significant weakness and lack of energy prior to her hospitalization.  Her energy levels and appetite have improved since  returning home. She lives alone but receives regular visits from her sister, a friend, and a Network engineer. She is able to cook and manage her daily activities independently. She monitors her blood sugar levels twice daily and has experienced a low reading of 40, which she managed by consuming peanut butter and bread. She states her machine is old and needs a new one.     Patient Active Problem List   Diagnosis Date Noted   Atherosclerosis of aorta (HCC) 06/08/2024   Sepsis due to gram-negative UTI (HCC) 06/07/2024   Atrial fibrillation with rapid ventricular response (HCC) 06/07/2024   Controlled type 2 diabetes mellitus without complication, without long-term current use of insulin  (HCC) 06/07/2024   Gout 06/07/2024   Chronic pain syndrome 01/05/2024   B12 deficiency 01/05/2024   Secondary hyperparathyroidism of renal origin (HCC) 01/05/2024   Greater trochanteric bursitis, left 06/18/2022   Gait instability 12/13/2020   Stage 3a chronic kidney disease (HCC) 12/13/2020   Morbid obesity with BMI of 45.0-49.9, adult (HCC) 02/06/2020   History of gastritis 08/14/2019   Angiodysplasia of stomach and duodenum    Iron  deficiency anemia 04/20/2018   Lichen sclerosus of female genitalia 06/29/2016   Primary osteoarthritis of both knees 04/07/2016   Right thyroid  nodule 06/26/2015   Asthma, well controlled 04/03/2015   Benign essential HTN 04/03/2015   Carpal tunnel syndrome 04/03/2015   Chronic kidney disease (CKD), stage II (mild) 04/03/2015   Controlled gout 04/03/2015   Arteriosclerosis of coronary artery  04/03/2015   Diabetes mellitus with renal manifestations, controlled (HCC) 04/03/2015   Dyslipidemia 04/03/2015   Bilateral edema of lower extremity 04/03/2015   Knee pain 04/03/2015   Lumbar radiculitis 04/03/2015   OSA on CPAP 04/03/2015   Lumbosacral spondylosis 04/03/2015   Osteoarthritis, chronic 04/03/2015   Psoriasis 04/03/2015   Vitamin D  deficiency 04/03/2015   Varicose veins  04/03/2015   Goiter 12/23/2014   Degeneration of intervertebral disc of lumbar region 08/23/2014    Past Surgical History:  Procedure Laterality Date   APPENDECTOMY     CATARACT EXTRACTION W/PHACO Left 08/14/2020   Procedure: CATARACT EXTRACTION PHACO AND INTRAOCULAR LENS PLACEMENT (IOC) LEFT DIABETIC 3.56 00:59.6 6.0%;  Surgeon: Mittie Gaskin, MD;  Location: [facility];  Service: Ophthalmology;  Laterality: Left;  Diabetic   CATARACT EXTRACTION W/PHACO Right 09/04/2020   Procedure: CATARACT EXTRACTION PHACO AND INTRAOCULAR LENS PLACEMENT (IOC) RIGHT DIABETIC;  Surgeon: Mittie Gaskin, MD;  Location: [facility];  Service: Ophthalmology;  Laterality: Right;  4.72 1:03.2 7.4%   CHOLECYSTECTOMY  10/20/1975   COLONOSCOPY WITH PROPOFOL  N/A 01/21/2016   Procedure: COLONOSCOPY WITH PROPOFOL ;  Surgeon: Rogelia Copping, MD;  Location: [facility];  Service: Endoscopy;  Laterality: N/A;   DILATION AND CURETTAGE OF UTERUS     Due to Amenorrhea   ESOPHAGOGASTRODUODENOSCOPY (EGD) WITH PROPOFOL  N/A 08/16/2018   Procedure: ESOPHAGOGASTRODUODENOSCOPY (EGD) WITH PROPOFOL ;  Surgeon: Copping Rogelia, MD;  Location: [facility];  Service: Endoscopy;  Laterality: N/A;   EYE SURGERY     HEMORRHOID BANDING  10/19/1978   HEMORROIDECTOMY     HERNIA REPAIR  10/19/2009   Temporal Area Excision Biopsy     For Birth Mark Changes, Negative Pathology   TONSILLECTOMY AND ADENOIDECTOMY      Family History  Problem Relation Age of Onset   Cancer Mother        brain tumor   Kidney disease Mother    Hypertension Mother    Heart disease Father    COPD Father    Hearing loss Father    Hypertension Sister    Arthritis Sister    Cancer Sister    Heart murmur Sister    GER disease Sister    Arthritis Sister    Lupus Sister    Diabetes Sister    Arthritis Sister    Osteopenia Sister    Heart disease Sister    Hypertension Sister    Hearing loss Sister    Obesity Sister     Breast cancer Maternal Aunt 59   Leukemia Maternal Aunt     Social History   Tobacco Use   Smoking status: Former    Current packs/day: 0.00    Average packs/day: 2.0 packs/day for 15.0 years (30.0 ttl pk-yrs)    Types: Cigarettes    Start date: 50    Quit date: 51    Years since quitting: 42.7   Smokeless tobacco: Never   Tobacco comments:    smoking cessation materials not required  Substance Use Topics   Alcohol  use: Not Currently    Alcohol /week: 0.0 standard drinks of alcohol      Current Outpatient Medications:    acetaminophen  (TYLENOL ) 500 MG tablet, Take 1 tablet (500 mg total) by mouth every 6 (six) hours as needed. (Patient taking differently: Take 1,000 mg by mouth 3 (three) times daily.), Disp: 480 tablet, Rfl: 0   albuterol  (VENTOLIN  HFA) 108 (90 Base) MCG/ACT inhaler, Inhale 2 puffs into the lungs every 6 (six) hours as needed for  wheezing or shortness of breath., Disp: 8 g, Rfl: 5   allopurinol  (ZYLOPRIM ) 100 MG tablet, Take 1 tablet (100 mg total) by mouth 2 (two) times daily., Disp: 180 tablet, Rfl: 3   atorvastatin  (LIPITOR) 20 MG tablet, TAKE 1 TABLET BY MOUTH DAILY FOR CHOLESTEROL, Disp: 90 tablet, Rfl: 3   Blood Pressure KIT, 1 each by Does not apply route daily., Disp: 1 kit, Rfl: 0   clobetasol  ointment (TEMOVATE ) 0.05 %, Apply 1 Application topically at bedtime. Apply to the affected area nightly for 4 weeks., Disp: , Rfl:    dapagliflozin  propanediol (FARXIGA ) 10 MG TABS tablet, Take 1 tablet (10 mg total) by mouth daily before breakfast., Disp: 90 tablet, Rfl: 3   diltiazem  (CARDIZEM  CD) 240 MG 24 hr capsule, Take 1 capsule (240 mg total) by mouth daily., Disp: , Rfl:    Dulaglutide  (TRULICITY ) 4.5 MG/0.5ML SOAJ, Inject 4.5 mg as directed once a week. (Patient taking differently: Inject 5 mg as directed once a week.), Disp: 8 mL, Rfl: 2   furosemide  (LASIX ) 20 MG tablet, Take 1 tablet (20 mg total) by mouth 2 (two) times daily. Second dose around noon,  Disp: 180 tablet, Rfl: 1   gabapentin  (NEURONTIN ) 300 MG capsule, TAKE 1 CAPSULE BY MOUTH IN THE MORNING, 1 CAPSULE IN THE EVENING AND 2 CAPSULES AT NIGHT, Disp: 360 capsule, Rfl: 3   glucose blood (ONETOUCH VERIO) test strip, USE AS DIRECTED, Disp: 50 each, Rfl: 1   Lancets (ONETOUCH ULTRASOFT) lancets, Use as instructed, Disp: 100 each, Rfl: 12   metFORMIN  (GLUCOPHAGE -XR) 750 MG 24 hr tablet, Take 1 tablet (750 mg total) by mouth daily with breakfast., Disp: 90 tablet, Rfl: 1   methocarbamol  (ROBAXIN ) 500 MG tablet, Take 500 mg by mouth 3 (three) times daily., Disp: , Rfl:    metoprolol  tartrate (LOPRESSOR ) 50 MG tablet, Take 1 tablet (50 mg total) by mouth 2 (two) times daily., Disp: , Rfl:    polyethylene glycol (MIRALAX  / GLYCOLAX ) 17 g packet, Take 17 g by mouth daily as needed for mild constipation., Disp: , Rfl:    potassium chloride  SA (KLOR-CON  M) 20 MEQ tablet, TAKE 1 TABLET BY MOUTH DAILY, Disp: 90 tablet, Rfl: 1   traMADol  (ULTRAM ) 50 MG tablet, Take 0.5-1 tablets (25-50 mg total) by mouth 2 (two) times daily as needed., Disp: 8 tablet, Rfl: 0   traZODone  (DESYREL ) 50 MG tablet, Take 0.5 tablets (25 mg total) by mouth at bedtime as needed for sleep., Disp: , Rfl:    triamcinolone  ointment (KENALOG ) 0.1 %, APPLY TOPICALLY TWICE DAILY AS NEEDED. GENERIC EQUIVALENT FOR KENALOG , Disp: 90 g, Rfl: 0   vitamin B-12 (CYANOCOBALAMIN ) 500 MCG tablet, Take 500 mcg by mouth daily., Disp: , Rfl:    Vitamin D , Cholecalciferol , 25 MCG (1000 UT) TABS, Take 1 capsule by mouth daily., Disp: 90 tablet, Rfl: 1   VOLTAREN  1 % GEL, Apply 4 g topically 4 (four) times daily., Disp: 300 g, Rfl: 3   warfarin (COUMADIN ) 5 MG tablet, Take 1 tablet (5 mg total) by mouth daily., Disp: , Rfl:   Allergies  Allergen Reactions   Codeine Hives and Nausea And Vomiting    I personally reviewed active problem list, medication list, allergies with the patient/caregiver today.   ROS  Ten systems reviewed and is  negative except as mentioned in HPI    Objective Physical Exam CONSTITUTIONAL: Patient appears well-developed and well-nourished. No distress. HEENT: Head atraumatic, normocephalic, neck supple. CARDIOVASCULAR: Normal rate,  regular rhythm and normal heart sounds. No murmur heard. Swelling in extremities. PULMONARY: Effort normal and breath sounds normal. No respiratory distress. ABDOMINAL: There is no tenderness or distention. MUSCULOSKELETAL: Normal gait. Without gross motor or sensory deficit. PSYCHIATRIC: Patient has a normal mood and affect. Behavior is normal. Judgment and thought content normal.  Vitals:   06/26/24 1005  BP: 116/72  Pulse: 78  Resp: 18  SpO2: 96%  Weight: 249 lb (112.9 kg)  Height: 5' 1 (1.549 m)    Body mass index is 47.05 kg/m.  Recent Results (from the past 2160 hours)  POCT glycosylated hemoglobin (Hb A1C)     Status: Abnormal   Collection Time: 05/05/24  1:38 PM  Result Value Ref Range   Hemoglobin A1C 7.3 (A) 4.0 - 5.6 %   HbA1c POC (<> result, manual entry)     HbA1c, POC (prediabetic range)     HbA1c, POC (controlled diabetic range)    Basic Metabolic Panel Without GFR     Status: Abnormal   Collection Time: 05/05/24  2:36 PM  Result Value Ref Range   Glucose, Bld 122 (H) 65 - 99 mg/dL    Comment: .            Fasting reference interval . For someone without known diabetes, a glucose value between 100 and 125 mg/dL is consistent with prediabetes and should be confirmed with a follow-up test. .    BUN 40 (H) 7 - 25 mg/dL   Creat 8.67 (H) 9.39 - 1.00 mg/dL   BUN/Creatinine Ratio 30 (H) 6 - 22 (calc)   Sodium 140 135 - 146 mmol/L   Potassium 4.7 3.5 - 5.3 mmol/L   Chloride 103 98 - 110 mmol/L   CO2 25 20 - 32 mmol/L   Calcium  9.9 8.6 - 10.4 mg/dL  Uric acid     Status: None   Collection Time: 05/05/24  2:36 PM  Result Value Ref Range   Uric Acid, Serum 4.3 2.5 - 7.0 mg/dL    Comment: Therapeutic target for gout patients: <6.0  mg/dL .   CBC with Differential/Platelet     Status: None   Collection Time: 05/05/24  2:36 PM  Result Value Ref Range   WBC 6.7 3.8 - 10.8 Thousand/uL   RBC 4.66 3.80 - 5.10 Million/uL   Hemoglobin 14.6 11.7 - 15.5 g/dL   HCT 54.9 64.9 - 54.9 %   MCV 96.6 80.0 - 100.0 fL   MCH 31.3 27.0 - 33.0 pg   MCHC 32.4 32.0 - 36.0 g/dL    Comment: For adults, a slight decrease in the calculated MCHC value (in the range of 30 to 32 g/dL) is most likely not clinically significant; however, it should be interpreted with caution in correlation with other red cell parameters and the patient's clinical condition.    RDW 14.2 11.0 - 15.0 %   Platelets 215 140 - 400 Thousand/uL   MPV 11.0 7.5 - 12.5 fL   Neutro Abs 4,134 1,500 - 7,800 cells/uL   Absolute Lymphocytes 1,508 850 - 3,900 cells/uL   Absolute Monocytes 811 200 - 950 cells/uL   Eosinophils Absolute 221 15 - 500 cells/uL   Basophils Absolute 27 0 - 200 cells/uL   Neutrophils Relative % 61.7 %   Total Lymphocyte 22.5 %   Monocytes Relative 12.1 %   Eosinophils Relative 3.3 %   Basophils Relative 0.4 %  TSH     Status: None   Collection Time: 05/05/24  2:36 PM  Result Value Ref Range   TSH 2.05 0.40 - 4.50 mIU/L  eGFR with Creatinine and Cystatin C     Status: Abnormal   Collection Time: 05/05/24  2:36 PM  Result Value Ref Range   Creat 1.43 (H) 0.60 - 1.00 mg/dL   CYSTATIN C 7.34 (H) 9.47 - 1.10 mg/L   eGFR (CREATININE) 38 (L) > OR = 60 mL/min/1.27m2   eGFR (CYSTATIN C) 19 (L) > OR = 60 mL/min/1.21m2   eGFR (CREATININE, CYSTATIN C) 26 (L) > OR = 60 mL/min/1.63m2    Comment: . For more information about eGFR visit https://www.kidney.org/professionals/kdoqi/gfr%5Fcalculator eGFR(cr) = CKD-EPI Creatinine equation (2021) eGFR(cys) = CKD-EPI Cystatin C equation (2012) eGFR(cr-cys) = CKD-EPI Creatinine-Cystatin C equation (2021)   TEST AUTHORIZATION     Status: None   Collection Time: 05/05/24  2:36 PM  Result Value Ref Range    TEST NAME: ESTIMATED GLOMERULAR FILTRATI    TEST CODE: [id number]    CLIENT CONTACT: Layth Cerezo    REPORT ALWAYS MESSAGE SIGNATURE      Comment: . The laboratory testing on this patient was verbally requested or confirmed by the ordering physician or his or her authorized representative after contact with an employee of [employer]. Federal regulations require that we maintain on file written authorization for all laboratory testing.  Accordingly we are asking that the ordering physician or his or her authorized representative sign a copy of this report and promptly return it to the client service representative. . . Signature:____________________________________________________ . Please fax this signed page to [phone] or return it via your [employer] courier.   Lactic acid, plasma     Status: None   Collection Time: 06/06/24 10:59 PM  Result Value Ref Range   Lactic Acid, Venous 1.5 0.5 - 1.9 mmol/L    Comment: Performed at [facility], [address] [place], [place] [address]  Protime-INR     Status: None   Collection Time: 06/06/24 10:59 PM  Result Value Ref Range   Prothrombin Time 14.9 11.4 - 15.2 seconds   INR 1.1 0.8 - 1.2    Comment: (NOTE) INR goal varies based on device and disease states. Performed at [facility], [address] [place], [place] [address]   CBC with Differential/Platelet     Status: Abnormal   Collection Time: 06/06/24 10:59 PM  Result Value Ref Range   WBC 10.7 (H) 4.0 - 10.5 K/uL   RBC 4.53 3.87 - 5.11 MIL/uL   Hemoglobin 14.1 12.0 - 15.0 g/dL   HCT 57.4 63.9 - 53.9 %   MCV 93.8 80.0 - 100.0 fL   MCH 31.1 26.0 - 34.0 pg   MCHC 33.2 30.0 - 36.0 g/dL   RDW 84.2 (H) 88.4 - 84.4 %   Platelets 142 (L) 150 - 400 K/uL   nRBC 0.2 0.0 - 0.2 %   Neutrophils Relative % 79 %   Neutro Abs 8.4 (H) 1.7 - 7.7 K/uL   Lymphocytes Relative 7 %   Lymphs Abs 0.7 0.7 - 4.0 K/uL   Monocytes Relative 12 %    Monocytes Absolute 1.3 (H) 0.1 - 1.0 K/uL   Eosinophils Relative 0 %   Eosinophils Absolute 0.0 0.0 - 0.5 K/uL   Basophils Relative 0 %   Basophils Absolute 0.0 0.0 - 0.1 K/uL   Immature Granulocytes 2 %   Abs Immature Granulocytes 0.23 (H) 0.00 - 0.07 K/uL    Comment: Performed at [facility],  [address] [place], [place] [address]  Comprehensive metabolic panel with GFR     Status: Abnormal   Collection Time: 06/06/24 10:59 PM  Result Value Ref Range   Sodium 133 (L) 135 - 145 mmol/L   Potassium 4.2 3.5 - 5.1 mmol/L   Chloride 98 98 - 111 mmol/L   CO2 20 (L) 22 - 32 mmol/L   Glucose, Bld 166 (H) 70 - 99 mg/dL    Comment: Glucose reference range applies only to samples taken after fasting for at least 8 hours.   BUN 46 (H) 8 - 23 mg/dL   Creatinine, Ser 8.45 (H) 0.44 - 1.00 mg/dL   Calcium  8.7 (L) 8.9 - 10.3 mg/dL   Total Protein 6.9 6.5 - 8.1 g/dL   Albumin 3.2 (L) 3.5 - 5.0 g/dL   AST 83 (H) 15 - 41 U/L   ALT 54 (H) 0 - 44 U/L   Alkaline Phosphatase 86 38 - 126 U/L   Total Bilirubin 1.3 (H) 0.0 - 1.2 mg/dL   GFR, Estimated 35 (L) >60 mL/min    Comment: (NOTE) Calculated using the CKD-EPI Creatinine Equation (2021)    Anion gap 15 5 - 15    Comment: Performed at [facility], [address] [place], [place] [address]  TSH     Status: None   Collection Time: 06/06/24 10:59 PM  Result Value Ref Range   TSH 1.951 0.350 - 4.500 uIU/mL    Comment: Performed by a 3rd Generation assay with a functional sensitivity of <=0.01 uIU/mL. Performed at [facility], [address] [place], [place] [address]   D-dimer, quantitative     Status: Abnormal   Collection Time: 06/06/24 10:59 PM  Result Value Ref Range   D-Dimer, Quant 3.83 (H) 0.00 - 0.50 ug/mL-FEU    Comment: (NOTE) At the manufacturer cut-off value of 0.5 g/mL FEU, this assay has a negative predictive value of 95-100%.This assay is intended for use in conjunction with a clinical  pretest probability (PTP) assessment model to exclude pulmonary embolism (PE) and deep venous thrombosis (DVT) in outpatients suspected of PE or DVT. Results should be correlated with clinical presentation. Performed at [facility], [address] [place], [place] [address]   Procalcitonin     Status: None   Collection Time: 06/06/24 10:59 PM  Result Value Ref Range   Procalcitonin 20.23 ng/mL    Comment:        Interpretation: PCT >= 10 ng/mL: Important systemic inflammatory response, almost exclusively due to severe bacterial sepsis or septic shock. (NOTE)       Sepsis PCT Algorithm           Lower Respiratory Tract                                      Infection PCT Algorithm    ----------------------------     ----------------------------         PCT < 0.25 ng/mL                PCT < 0.10 ng/mL          Strongly encourage             Strongly discourage   discontinuation of antibiotics    initiation of antibiotics    ----------------------------     -----------------------------       PCT 0.25 - 0.50  ng/mL            PCT 0.10 - 0.25 ng/mL               OR       >80% decrease in PCT            Discourage initiation of                                            antibiotics      Encourage discontinuation           of antibiotics    ----------------------------     -----------------------------         PCT >= 0.50 ng/mL              PCT 0.26 - 0.50 ng/mL                AND       <80% decrease in PCT             Encourage initiation of                                             antibiotics       Encourage continuation           of antibiotics    ----------------------------     -----------------------------        PCT >= 0.50 ng/mL                  PCT > 0.50 ng/mL               AND         increase in PCT                  Strongly encourage                                      initiation of antibiotics    Strongly encourage escalation           of  antibiotics                                     -----------------------------                                           PCT <= 0.25 ng/mL                                                 OR                                        > 80% decrease in PCT  Discontinue / Do not initiate                                             antibiotics  Performed at [facility], [address] [place], [place] [address]   Troponin I (High Sensitivity)     Status: Abnormal   Collection Time: 06/06/24 10:59 PM  Result Value Ref Range   Troponin I (High Sensitivity) 27 (H) <18 ng/L    Comment: (NOTE) Elevated high sensitivity troponin I (hsTnI) values and significant  changes across serial measurements may suggest ACS but many other  chronic and acute conditions are known to elevate hsTnI results.  Refer to the Links section for chest pain algorithms and additional  guidance. Performed at [facility], [address] [place], [place] [address]   Magnesium      Status: None   Collection Time: 06/06/24 10:59 PM  Result Value Ref Range   Magnesium  2.0 1.7 - 2.4 mg/dL    Comment: Performed at [facility], [address] [place], [place] [address]  Culture, blood (Routine x 2)     Status: None   Collection Time: 06/06/24 11:00 PM   Specimen: BLOOD  Result Value Ref Range   Specimen Description BLOOD LEFT ANTECUBITAL    Special Requests      BOTTLES DRAWN AEROBIC AND ANAEROBIC Blood Culture results may not be optimal due to an excessive volume of blood received in culture bottles   Culture      NO GROWTH 5 DAYS Performed at [facility], [address] [place], [place] [address]    Report Status 06/11/2024 FINAL   Urinalysis, w/ Reflex to Culture (Infection Suspected) -Urine, Clean Catch     Status: Abnormal   Collection Time: 06/06/24 11:00 PM  Result Value Ref Range   Specimen Source URINE, CATHETERIZED    Color,  Urine YELLOW (A) YELLOW   APPearance CLOUDY (A) CLEAR   Specific Gravity, Urine 1.013 1.005 - 1.030   pH 5.0 5.0 - 8.0   Glucose, UA >=500 (A) NEGATIVE mg/dL   Hgb urine dipstick MODERATE (A) NEGATIVE   Bilirubin Urine NEGATIVE NEGATIVE   Ketones, ur 5 (A) NEGATIVE mg/dL   Protein, ur 899 (A) NEGATIVE mg/dL   Nitrite NEGATIVE NEGATIVE   Leukocytes,Ua SMALL (A) NEGATIVE   RBC / HPF 11-20 0 - 5 RBC/hpf   WBC, UA 21-50 0 - 5 WBC/hpf    Comment:        Reflex urine culture not performed if WBC <=10, OR if Squamous epithelial cells >5. If Squamous epithelial cells >5 suggest recollection.    Bacteria, UA MANY (A) NONE SEEN   Squamous Epithelial / HPF 0-5 0 - 5 /HPF   WBC Clumps PRESENT    Mucus PRESENT    Amorphous Crystal PRESENT     Comment: Performed at [facility], [address], [place], [place] [address]  Urine Culture     Status: Abnormal   Collection Time: 06/06/24 11:00 PM   Specimen: Urine, Random  Result Value Ref Range   Specimen Description      URINE, RANDOM Performed at [facility], [address], [place], [place] [address]    Special Requests      NONE Reflexed from [id number] Performed at [facility], [address] [place] [place], [place] [address]  Culture >=100,000 COLONIES/mL KLEBSIELLA PNEUMONIAE (A)    Report Status 06/09/2024 FINAL    Organism ID, Bacteria KLEBSIELLA PNEUMONIAE (A)       Susceptibility   Klebsiella pneumoniae - MIC*    AMPICILLIN >=32 RESISTANT Resistant     CEFAZOLIN (URINE) Value in next row Sensitive      2 SENSITIVEThis is a modified FDA-approved test that has been validated and its performance characteristics determined by the reporting laboratory.  This laboratory is certified under the Clinical Laboratory Improvement Amendments CLIA as qualified to perform high complexity clinical laboratory testing.    CEFEPIME  Value in next row Sensitive      2 SENSITIVEThis is a modified FDA-approved test that has  been validated and its performance characteristics determined by the reporting laboratory.  This laboratory is certified under the Clinical Laboratory Improvement Amendments CLIA as qualified to perform high complexity clinical laboratory testing.    ERTAPENEM Value in next row Sensitive      2 SENSITIVEThis is a modified FDA-approved test that has been validated and its performance characteristics determined by the reporting laboratory.  This laboratory is certified under the Clinical Laboratory Improvement Amendments CLIA as qualified to perform high complexity clinical laboratory testing.    CEFTRIAXONE  Value in next row Sensitive      2 SENSITIVEThis is a modified FDA-approved test that has been validated and its performance characteristics determined by the reporting laboratory.  This laboratory is certified under the Clinical Laboratory Improvement Amendments CLIA as qualified to perform high complexity clinical laboratory testing.    CIPROFLOXACIN Value in next row Sensitive      2 SENSITIVEThis is a modified FDA-approved test that has been validated and its performance characteristics determined by the reporting laboratory.  This laboratory is certified under the Clinical Laboratory Improvement Amendments CLIA as qualified to perform high complexity clinical laboratory testing.    GENTAMICIN Value in next row Sensitive      2 SENSITIVEThis is a modified FDA-approved test that has been validated and its performance characteristics determined by the reporting laboratory.  This laboratory is certified under the Clinical Laboratory Improvement Amendments CLIA as qualified to perform high complexity clinical laboratory testing.    NITROFURANTOIN Value in next row Intermediate      2 SENSITIVEThis is a modified FDA-approved test that has been validated and its performance characteristics determined by the reporting laboratory.  This laboratory is certified under the Clinical Laboratory Improvement  Amendments CLIA as qualified to perform high complexity clinical laboratory testing.    TRIMETH/SULFA Value in next row Sensitive      2 SENSITIVEThis is a modified FDA-approved test that has been validated and its performance characteristics determined by the reporting laboratory.  This laboratory is certified under the Clinical Laboratory Improvement Amendments CLIA as qualified to perform high complexity clinical laboratory testing.    AMPICILLIN/SULBACTAM Value in next row Sensitive      2 SENSITIVEThis is a modified FDA-approved test that has been validated and its performance characteristics determined by the reporting laboratory.  This laboratory is certified under the Clinical Laboratory Improvement Amendments CLIA as qualified to perform high complexity clinical laboratory testing.    PIP/TAZO Value in next row Sensitive ug/mL     <=4 SENSITIVEThis is a modified FDA-approved test that has been validated and its performance characteristics determined by the reporting laboratory.  This laboratory is certified under the Clinical Laboratory Improvement Amendments CLIA as qualified to perform high complexity clinical laboratory testing.    MEROPENEM  Value in next row Sensitive      <=4 SENSITIVEThis is a modified FDA-approved test that has been validated and its performance characteristics determined by the reporting laboratory.  This laboratory is certified under the Clinical Laboratory Improvement Amendments CLIA as qualified to perform high complexity clinical laboratory testing.    * >=100,000 COLONIES/mL KLEBSIELLA PNEUMONIAE  Culture, blood (Routine x 2)     Status: None   Collection Time: 06/06/24 11:20 PM   Specimen: BLOOD  Result Value Ref Range   Specimen Description BLOOD BLOOD LEFT ARM    Special Requests      BOTTLES DRAWN AEROBIC AND ANAEROBIC Blood Culture adequate volume   Culture      NO GROWTH 5 DAYS Performed at [facility], [address] [place], [place]  [address]    Report Status 06/11/2024 FINAL   Troponin I (High Sensitivity)     Status: Abnormal   Collection Time: 06/07/24  2:16 AM  Result Value Ref Range   Troponin I (High Sensitivity) 26 (H) <18 ng/L    Comment: (NOTE) Elevated high sensitivity troponin I (hsTnI) values and significant  changes across serial measurements may suggest ACS but many other  chronic and acute conditions are known to elevate hsTnI results.  Refer to the Links section for chest pain algorithms and additional  guidance. Performed at [facility], [address] [place], [place] [address]   Cortisol-am, blood     Status: Abnormal   Collection Time: 06/07/24  6:23 AM  Result Value Ref Range   Cortisol - AM 25.9 (H) 6.7 - 22.6 ug/dL    Comment: Performed at [facility], [address] [place], [place] [address]  CBG monitoring, ED     Status: Abnormal   Collection Time: 06/07/24  8:06 AM  Result Value Ref Range   Glucose-Capillary 159 (H) 70 - 99 mg/dL    Comment: Glucose reference range applies only to samples taken after fasting for at least 8 hours.  ECHOCARDIOGRAM COMPLETE     Status: None   Collection Time: 06/07/24  9:36 AM  Result Value Ref Range   Height 61 in   BP 137/103 mmHg   Ao pk vel 1.77 m/s   AV Area VTI 2.24 cm2   AR max vel 2.15 cm2   AV Mean grad 7.0 mmHg   AV Peak grad 12.5 mmHg   S' Lateral 3.10 cm   AV Area mean vel 2.20 cm2   Area-P 1/2 4.41 cm2   Est EF 50 - 55%   CBG monitoring, ED     Status: Abnormal   Collection Time: 06/07/24 11:32 AM  Result Value Ref Range   Glucose-Capillary 144 (H) 70 - 99 mg/dL    Comment: Glucose reference range applies only to samples taken after fasting for at least 8 hours.  CBG monitoring, ED     Status: Abnormal   Collection Time: 06/07/24  5:31 PM  Result Value Ref Range   Glucose-Capillary 196 (H) 70 - 99 mg/dL    Comment: Glucose reference range applies only to samples taken after fasting for at least 8 hours.   Glucose, capillary     Status: Abnormal   Collection Time: 06/07/24 10:09 PM  Result Value Ref Range   Glucose-Capillary 158 (H) 70 - 99 mg/dL    Comment: Glucose reference range applies only to samples taken after fasting for at least 8 hours.  Basic metabolic panel     Status:  Abnormal   Collection Time: 06/08/24  5:41 AM  Result Value Ref Range   Sodium 132 (L) 135 - 145 mmol/L   Potassium 3.9 3.5 - 5.1 mmol/L   Chloride 101 98 - 111 mmol/L   CO2 22 22 - 32 mmol/L   Glucose, Bld 177 (H) 70 - 99 mg/dL    Comment: Glucose reference range applies only to samples taken after fasting for at least 8 hours.   BUN 43 (H) 8 - 23 mg/dL   Creatinine, Ser 8.48 (H) 0.44 - 1.00 mg/dL   Calcium  8.3 (L) 8.9 - 10.3 mg/dL   GFR, Estimated 36 (L) >60 mL/min    Comment: (NOTE) Calculated using the CKD-EPI Creatinine Equation (2021)    Anion gap 9 5 - 15    Comment: Performed at [facility], [address] [place], [place] [address]  CBC     Status: Abnormal   Collection Time: 06/08/24  5:41 AM  Result Value Ref Range   WBC 12.6 (H) 4.0 - 10.5 K/uL   RBC 4.04 3.87 - 5.11 MIL/uL   Hemoglobin 12.6 12.0 - 15.0 g/dL   HCT 62.1 63.9 - 53.9 %   MCV 93.6 80.0 - 100.0 fL   MCH 31.2 26.0 - 34.0 pg   MCHC 33.3 30.0 - 36.0 g/dL   RDW 84.2 (H) 88.4 - 84.4 %   Platelets 151 150 - 400 K/uL   nRBC 0.0 0.0 - 0.2 %    Comment: Performed at [facility], [address], [place], [place] [address]  Magnesium      Status: Abnormal   Collection Time: 06/08/24  5:41 AM  Result Value Ref Range   Magnesium  2.7 (H) 1.7 - 2.4 mg/dL    Comment: Performed at [facility], [address] [place], [place] [address]  Brain natriuretic peptide     Status: Abnormal   Collection Time: 06/08/24  5:41 AM  Result Value Ref Range   B Natriuretic Peptide 158.8 (H) 0.0 - 100.0 pg/mL    Comment: Performed at [facility], [address] [place], [place] [address]  Glucose, capillary      Status: Abnormal   Collection Time: 06/08/24  7:30 AM  Result Value Ref Range   Glucose-Capillary 142 (H) 70 - 99 mg/dL    Comment: Glucose reference range applies only to samples taken after fasting for at least 8 hours.  Glucose, capillary     Status: Abnormal   Collection Time: 06/08/24 12:08 PM  Result Value Ref Range   Glucose-Capillary 202 (H) 70 - 99 mg/dL    Comment: Glucose reference range applies only to samples taken after fasting for at least 8 hours.  Glucose, capillary     Status: Abnormal   Collection Time: 06/08/24  5:19 PM  Result Value Ref Range   Glucose-Capillary 171 (H) 70 - 99 mg/dL    Comment: Glucose reference range applies only to samples taken after fasting for at least 8 hours.  Glucose, capillary     Status: Abnormal   Collection Time: 06/08/24  8:44 PM  Result Value Ref Range   Glucose-Capillary 186 (H) 70 - 99 mg/dL    Comment: Glucose reference range applies only to samples taken after fasting for at least 8 hours.  Basic metabolic panel with GFR     Status: Abnormal   Collection Time: 06/09/24  6:06 AM  Result Value Ref Range   Sodium 135 135 - 145 mmol/L   Potassium  3.8 3.5 - 5.1 mmol/L   Chloride 104 98 - 111 mmol/L   CO2 22 22 - 32 mmol/L   Glucose, Bld 162 (H) 70 - 99 mg/dL    Comment: Glucose reference range applies only to samples taken after fasting for at least 8 hours.   BUN 55 (H) 8 - 23 mg/dL   Creatinine, Ser 8.48 (H) 0.44 - 1.00 mg/dL   Calcium  8.5 (L) 8.9 - 10.3 mg/dL   GFR, Estimated 36 (L) >60 mL/min    Comment: (NOTE) Calculated using the CKD-EPI Creatinine Equation (2021)    Anion gap 9 5 - 15    Comment: Performed at [facility], [address] [place], [place] [address]  Magnesium      Status: Abnormal   Collection Time: 06/09/24  6:06 AM  Result Value Ref Range   Magnesium  2.6 (H) 1.7 - 2.4 mg/dL    Comment: Performed at [facility], [address] [place], [place] [address]  Glucose, capillary      Status: Abnormal   Collection Time: 06/09/24  8:54 AM  Result Value Ref Range   Glucose-Capillary 212 (H) 70 - 99 mg/dL    Comment: Glucose reference range applies only to samples taken after fasting for at least 8 hours.  Glucose, capillary     Status: Abnormal   Collection Time: 06/09/24 12:38 PM  Result Value Ref Range   Glucose-Capillary 150 (H) 70 - 99 mg/dL    Comment: Glucose reference range applies only to samples taken after fasting for at least 8 hours.  Glucose, capillary     Status: Abnormal   Collection Time: 06/09/24  4:19 PM  Result Value Ref Range   Glucose-Capillary 196 (H) 70 - 99 mg/dL    Comment: Glucose reference range applies only to samples taken after fasting for at least 8 hours.  Glucose, capillary     Status: Abnormal   Collection Time: 06/09/24  9:14 PM  Result Value Ref Range   Glucose-Capillary 198 (H) 70 - 99 mg/dL    Comment: Glucose reference range applies only to samples taken after fasting for at least 8 hours.  Protime-INR     Status: Abnormal   Collection Time: 06/10/24  5:32 AM  Result Value Ref Range   Prothrombin Time 17.7 (H) 11.4 - 15.2 seconds   INR 1.4 (H) 0.8 - 1.2    Comment: (NOTE) INR goal varies based on device and disease states. Performed at [facility], [address] [place], [place] [address]   Basic metabolic panel with GFR     Status: Abnormal   Collection Time: 06/10/24  5:32 AM  Result Value Ref Range   Sodium 138 135 - 145 mmol/L   Potassium 4.1 3.5 - 5.1 mmol/L   Chloride 109 98 - 111 mmol/L   CO2 23 22 - 32 mmol/L   Glucose, Bld 171 (H) 70 - 99 mg/dL    Comment: Glucose reference range applies only to samples taken after fasting for at least 8 hours.   BUN 50 (H) 8 - 23 mg/dL   Creatinine, Ser 8.79 (H) 0.44 - 1.00 mg/dL   Calcium  8.9 8.9 - 10.3 mg/dL   GFR, Estimated 47 (L) >60 mL/min    Comment: (NOTE) Calculated using the CKD-EPI Creatinine Equation (2021)    Anion gap 6 5 - 15    Comment:  Performed at [facility], [address] [place], [place] [address]  Magnesium   Status: Abnormal   Collection Time: 06/10/24  5:32 AM  Result Value Ref Range   Magnesium  2.5 (H) 1.7 - 2.4 mg/dL    Comment: Performed at [facility], [address] [place], [place] [address]  Glucose, capillary     Status: Abnormal   Collection Time: 06/10/24  7:57 AM  Result Value Ref Range   Glucose-Capillary 177 (H) 70 - 99 mg/dL    Comment: Glucose reference range applies only to samples taken after fasting for at least 8 hours.  Glucose, capillary     Status: Abnormal   Collection Time: 06/10/24 11:04 AM  Result Value Ref Range   Glucose-Capillary 164 (H) 70 - 99 mg/dL    Comment: Glucose reference range applies only to samples taken after fasting for at least 8 hours.  Glucose, capillary     Status: Abnormal   Collection Time: 06/10/24  4:42 PM  Result Value Ref Range   Glucose-Capillary 160 (H) 70 - 99 mg/dL    Comment: Glucose reference range applies only to samples taken after fasting for at least 8 hours.  Glucose, capillary     Status: Abnormal   Collection Time: 06/10/24  9:08 PM  Result Value Ref Range   Glucose-Capillary 153 (H) 70 - 99 mg/dL    Comment: Glucose reference range applies only to samples taken after fasting for at least 8 hours.  Protime-INR     Status: Abnormal   Collection Time: 06/11/24  6:36 AM  Result Value Ref Range   Prothrombin Time 18.0 (H) 11.4 - 15.2 seconds   INR 1.4 (H) 0.8 - 1.2    Comment: (NOTE) INR goal varies based on device and disease states. Performed at [facility], [address] [place], [place] [address]   Magnesium      Status: None   Collection Time: 06/11/24  6:36 AM  Result Value Ref Range   Magnesium  2.4 1.7 - 2.4 mg/dL    Comment: Performed at [facility], [address] [place], [place] [address]  Basic metabolic panel with GFR     Status: Abnormal   Collection Time: 06/11/24  6:36 AM   Result Value Ref Range   Sodium 141 135 - 145 mmol/L   Potassium 4.3 3.5 - 5.1 mmol/L   Chloride 112 (H) 98 - 111 mmol/L   CO2 21 (L) 22 - 32 mmol/L   Glucose, Bld 162 (H) 70 - 99 mg/dL    Comment: Glucose reference range applies only to samples taken after fasting for at least 8 hours.   BUN 46 (H) 8 - 23 mg/dL   Creatinine, Ser 9.01 0.44 - 1.00 mg/dL   Calcium  9.1 8.9 - 10.3 mg/dL   GFR, Estimated 60 (L) >60 mL/min    Comment: (NOTE) Calculated using the CKD-EPI Creatinine Equation (2021)    Anion gap 8 5 - 15    Comment: Performed at [facility], [address] [place], [place] [address]  Glucose, capillary     Status: Abnormal   Collection Time: 06/11/24  8:38 AM  Result Value Ref Range   Glucose-Capillary 156 (H) 70 - 99 mg/dL    Comment: Glucose reference range applies only to samples taken after fasting for at least 8 hours.  Glucose, capillary     Status: Abnormal   Collection Time: 06/11/24 12:41 PM  Result Value Ref Range   Glucose-Capillary 159 (H) 70 - 99 mg/dL    Comment: Glucose reference range applies only to  samples taken after fasting for at least 8 hours.  Glucose, capillary     Status: Abnormal   Collection Time: 06/11/24  5:06 PM  Result Value Ref Range   Glucose-Capillary 162 (H) 70 - 99 mg/dL    Comment: Glucose reference range applies only to samples taken after fasting for at least 8 hours.  Glucose, capillary     Status: Abnormal   Collection Time: 06/11/24  9:33 PM  Result Value Ref Range   Glucose-Capillary 154 (H) 70 - 99 mg/dL    Comment: Glucose reference range applies only to samples taken after fasting for at least 8 hours.  Protime-INR     Status: Abnormal   Collection Time: 06/12/24  5:39 AM  Result Value Ref Range   Prothrombin Time 22.6 (H) 11.4 - 15.2 seconds   INR 1.9 (H) 0.8 - 1.2    Comment: (NOTE) INR goal varies based on device and disease states. Performed at [facility], [address]  [place], [place] [address]   CBC     Status: Abnormal   Collection Time: 06/12/24  5:39 AM  Result Value Ref Range   WBC 10.1 4.0 - 10.5 K/uL   RBC 4.39 3.87 - 5.11 MIL/uL   Hemoglobin 13.4 12.0 - 15.0 g/dL   HCT 58.4 63.9 - 53.9 %   MCV 94.5 80.0 - 100.0 fL   MCH 30.5 26.0 - 34.0 pg   MCHC 32.3 30.0 - 36.0 g/dL   RDW 83.9 (H) 88.4 - 84.4 %   Platelets 361 150 - 400 K/uL   nRBC 0.0 0.0 - 0.2 %    Comment: Performed at [facility], [address] [place], [place] [address]  Glucose, capillary     Status: Abnormal   Collection Time: 06/12/24  8:41 AM  Result Value Ref Range   Glucose-Capillary 207 (H) 70 - 99 mg/dL    Comment: Glucose reference range applies only to samples taken after fasting for at least 8 hours.  Glucose, capillary     Status: Abnormal   Collection Time: 06/12/24 12:09 PM  Result Value Ref Range   Glucose-Capillary 103 (H) 70 - 99 mg/dL    Comment: Glucose reference range applies only to samples taken after fasting for at least 8 hours.  Glucose, capillary     Status: Abnormal   Collection Time: 06/12/24  4:42 PM  Result Value Ref Range   Glucose-Capillary 150 (H) 70 - 99 mg/dL    Comment: Glucose reference range applies only to samples taken after fasting for at least 8 hours.  Glucose, capillary     Status: Abnormal   Collection Time: 06/12/24 10:38 PM  Result Value Ref Range   Glucose-Capillary 164 (H) 70 - 99 mg/dL    Comment: Glucose reference range applies only to samples taken after fasting for at least 8 hours.  Protime-INR     Status: Abnormal   Collection Time: 06/13/24  4:35 AM  Result Value Ref Range   Prothrombin Time 27.8 (H) 11.4 - 15.2 seconds   INR 2.5 (H) 0.8 - 1.2    Comment: (NOTE) INR goal varies based on device and disease states. Performed at [facility], [address] [place], [place] [address]   Basic metabolic panel with GFR     Status: Abnormal   Collection Time: 06/13/24  4:35 AM  Result Value Ref Range    Sodium 146 (H) 135 - 145 mmol/L   Potassium 4.3 3.5 - 5.1  mmol/L   Chloride 115 (H) 98 - 111 mmol/L   CO2 25 22 - 32 mmol/L   Glucose, Bld 154 (H) 70 - 99 mg/dL    Comment: Glucose reference range applies only to samples taken after fasting for at least 8 hours.   BUN 33 (H) 8 - 23 mg/dL   Creatinine, Ser 9.09 0.44 - 1.00 mg/dL   Calcium  9.4 8.9 - 10.3 mg/dL   GFR, Estimated >39 >39 mL/min    Comment: (NOTE) Calculated using the CKD-EPI Creatinine Equation (2021)    Anion gap 6 5 - 15    Comment: Performed at [facility], [address] [place], [place] [address]  Magnesium      Status: None   Collection Time: 06/13/24  4:35 AM  Result Value Ref Range   Magnesium  1.9 1.7 - 2.4 mg/dL    Comment: Performed at [facility], [address] [place], [place] [address]  Glucose, capillary     Status: Abnormal   Collection Time: 06/13/24  7:30 AM  Result Value Ref Range   Glucose-Capillary 156 (H) 70 - 99 mg/dL    Comment: Glucose reference range applies only to samples taken after fasting for at least 8 hours.  Glucose, capillary     Status: Abnormal   Collection Time: 06/13/24 10:56 AM  Result Value Ref Range   Glucose-Capillary 136 (H) 70 - 99 mg/dL    Comment: Glucose reference range applies only to samples taken after fasting for at least 8 hours.  Glucose, capillary     Status: Abnormal   Collection Time: 06/13/24  3:10 PM  Result Value Ref Range   Glucose-Capillary 155 (H) 70 - 99 mg/dL    Comment: Glucose reference range applies only to samples taken after fasting for at least 8 hours.  Glucose, capillary     Status: Abnormal   Collection Time: 06/13/24 11:02 PM  Result Value Ref Range   Glucose-Capillary 143 (H) 70 - 99 mg/dL    Comment: Glucose reference range applies only to samples taken after fasting for at least 8 hours.  Protime-INR     Status: Abnormal   Collection Time: 06/14/24  3:55 AM  Result Value Ref Range   Prothrombin Time 30.2 (H)  11.4 - 15.2 seconds   INR 2.7 (H) 0.8 - 1.2    Comment: (NOTE) INR goal varies based on device and disease states. Performed at [facility], [address] [place], [place] [address]   Basic metabolic panel with GFR     Status: Abnormal   Collection Time: 06/14/24  3:55 AM  Result Value Ref Range   Sodium 143 135 - 145 mmol/L   Potassium 4.3 3.5 - 5.1 mmol/L   Chloride 114 (H) 98 - 111 mmol/L   CO2 24 22 - 32 mmol/L   Glucose, Bld 154 (H) 70 - 99 mg/dL    Comment: Glucose reference range applies only to samples taken after fasting for at least 8 hours.   BUN 29 (H) 8 - 23 mg/dL   Creatinine, Ser 9.14 0.44 - 1.00 mg/dL   Calcium  9.3 8.9 - 10.3 mg/dL   GFR, Estimated >39 >39 mL/min    Comment: (NOTE) Calculated using the CKD-EPI Creatinine Equation (2021)    Anion gap 5 5 - 15    Comment: Performed at [facility], [address] [place], [place] [address]  Magnesium      Status: None   Collection Time: 06/14/24  3:55 AM  Result Value Ref Range   Magnesium  1.7 1.7 - 2.4 mg/dL    Comment: Performed at [facility], [address] [place], [place] [address]  Glucose, capillary     Status: Abnormal   Collection Time: 06/14/24  7:39 AM  Result Value Ref Range   Glucose-Capillary 158 (H) 70 - 99 mg/dL    Comment: Glucose reference range applies only to samples taken after fasting for at least 8 hours.  Glucose, capillary     Status: Abnormal   Collection Time: 06/14/24 11:52 AM  Result Value Ref Range   Glucose-Capillary 119 (H) 70 - 99 mg/dL    Comment: Glucose reference range applies only to samples taken after fasting for at least 8 hours.  Glucose, capillary     Status: Abnormal   Collection Time: 06/14/24  5:11 PM  Result Value Ref Range   Glucose-Capillary 128 (H) 70 - 99 mg/dL    Comment: Glucose reference range applies only to samples taken after fasting for at least 8 hours.  Glucose, capillary     Status: Abnormal   Collection Time:  06/14/24  8:31 PM  Result Value Ref Range   Glucose-Capillary 164 (H) 70 - 99 mg/dL    Comment: Glucose reference range applies only to samples taken after fasting for at least 8 hours.  Protime-INR     Status: Abnormal   Collection Time: 06/15/24  6:16 AM  Result Value Ref Range   Prothrombin Time 30.2 (H) 11.4 - 15.2 seconds   INR 2.7 (H) 0.8 - 1.2    Comment: (NOTE) INR goal varies based on device and disease states. Performed at [facility], [address] [place], [place] [address]   CBC with Differential/Platelet     Status: Abnormal   Collection Time: 06/15/24  6:16 AM  Result Value Ref Range   WBC 6.0 4.0 - 10.5 K/uL   RBC 4.11 3.87 - 5.11 MIL/uL   Hemoglobin 12.7 12.0 - 15.0 g/dL   HCT 60.6 63.9 - 53.9 %   MCV 95.6 80.0 - 100.0 fL   MCH 30.9 26.0 - 34.0 pg   MCHC 32.3 30.0 - 36.0 g/dL   RDW 83.5 (H) 88.4 - 84.4 %   Platelets 331 150 - 400 K/uL   nRBC 0.0 0.0 - 0.2 %   Neutrophils Relative % 55 %   Neutro Abs 3.3 1.7 - 7.7 K/uL   Lymphocytes Relative 23 %   Lymphs Abs 1.4 0.7 - 4.0 K/uL   Monocytes Relative 18 %   Monocytes Absolute 1.1 (H) 0.1 - 1.0 K/uL   Eosinophils Relative 2 %   Eosinophils Absolute 0.1 0.0 - 0.5 K/uL   Basophils Relative 1 %   Basophils Absolute 0.0 0.0 - 0.1 K/uL   Immature Granulocytes 1 %   Abs Immature Granulocytes 0.07 0.00 - 0.07 K/uL    Comment: Performed at [facility], [address] [place], [place] [address]  Basic metabolic panel     Status: Abnormal   Collection Time: 06/15/24  6:16 AM  Result Value Ref Range   Sodium 142 135 - 145 mmol/L   Potassium 4.1 3.5 - 5.1 mmol/L   Chloride 113 (H) 98 - 111 mmol/L   CO2 22 22 - 32 mmol/L   Glucose, Bld 152 (H) 70 - 99 mg/dL    Comment: Glucose reference range applies only to samples taken after fasting for at least 8 hours.   BUN 31 (H) 8 - 23  mg/dL   Creatinine, Ser 9.14 0.44 - 1.00 mg/dL   Calcium  9.1 8.9 - 10.3 mg/dL   GFR, Estimated >39 >39 mL/min     Comment: (NOTE) Calculated using the CKD-EPI Creatinine Equation (2021)    Anion gap 7 5 - 15    Comment: Performed at [facility], [address] [place], [place] [address]  Glucose, capillary     Status: Abnormal   Collection Time: 06/15/24  8:16 AM  Result Value Ref Range   Glucose-Capillary 137 (H) 70 - 99 mg/dL    Comment: Glucose reference range applies only to samples taken after fasting for at least 8 hours.  Glucose, capillary     Status: Abnormal   Collection Time: 06/15/24 12:08 PM  Result Value Ref Range   Glucose-Capillary 118 (H) 70 - 99 mg/dL    Comment: Glucose reference range applies only to samples taken after fasting for at least 8 hours.  Glucose, capillary     Status: Abnormal   Collection Time: 06/15/24  4:23 PM  Result Value Ref Range   Glucose-Capillary 139 (H) 70 - 99 mg/dL    Comment: Glucose reference range applies only to samples taken after fasting for at least 8 hours.  Glucose, capillary     Status: Abnormal   Collection Time: 06/15/24  8:27 PM  Result Value Ref Range   Glucose-Capillary 158 (H) 70 - 99 mg/dL    Comment: Glucose reference range applies only to samples taken after fasting for at least 8 hours.  Protime-INR     Status: Abnormal   Collection Time: 06/16/24  7:16 AM  Result Value Ref Range   Prothrombin Time 29.5 (H) 11.4 - 15.2 seconds   INR 2.7 (H) 0.8 - 1.2    Comment: (NOTE) INR goal varies based on device and disease states. Performed at [facility], [address] [place], [place] [address]   CBC with Differential/Platelet     Status: Abnormal   Collection Time: 06/16/24  7:16 AM  Result Value Ref Range   WBC 6.3 4.0 - 10.5 K/uL   RBC 4.43 3.87 - 5.11 MIL/uL   Hemoglobin 13.4 12.0 - 15.0 g/dL   HCT 56.8 63.9 - 53.9 %   MCV 97.3 80.0 - 100.0 fL   MCH 30.2 26.0 - 34.0 pg   MCHC 31.1 30.0 - 36.0 g/dL   RDW 83.3 (H) 88.4 - 84.4 %   Platelets 358 150 - 400 K/uL   nRBC 0.0 0.0 - 0.2 %   Neutrophils  Relative % 57 %   Neutro Abs 3.6 1.7 - 7.7 K/uL   Lymphocytes Relative 23 %   Lymphs Abs 1.5 0.7 - 4.0 K/uL   Monocytes Relative 16 %   Monocytes Absolute 1.0 0.1 - 1.0 K/uL   Eosinophils Relative 2 %   Eosinophils Absolute 0.1 0.0 - 0.5 K/uL   Basophils Relative 1 %   Basophils Absolute 0.1 0.0 - 0.1 K/uL   Immature Granulocytes 1 %   Abs Immature Granulocytes 0.07 0.00 - 0.07 K/uL    Comment: Performed at [facility], [address] [place], [place] [address]  Basic metabolic panel     Status: Abnormal   Collection Time: 06/16/24  7:16 AM  Result Value Ref Range   Sodium 142 135 - 145 mmol/L   Potassium 3.9 3.5 - 5.1 mmol/L   Chloride 110 98 - 111 mmol/L   CO2 23 22 - 32 mmol/L   Glucose, Bld  145 (H) 70 - 99 mg/dL    Comment: Glucose reference range applies only to samples taken after fasting for at least 8 hours.   BUN 30 (H) 8 - 23 mg/dL   Creatinine, Ser 9.03 0.44 - 1.00 mg/dL   Calcium  9.3 8.9 - 10.3 mg/dL   GFR, Estimated >39 >39 mL/min    Comment: (NOTE) Calculated using the CKD-EPI Creatinine Equation (2021)    Anion gap 9 5 - 15    Comment: Performed at [facility], [address] [place], [place] [address]  Glucose, capillary     Status: Abnormal   Collection Time: 06/16/24  9:03 AM  Result Value Ref Range   Glucose-Capillary 196 (H) 70 - 99 mg/dL    Comment: Glucose reference range applies only to samples taken after fasting for at least 8 hours.  Glucose, capillary     Status: Abnormal   Collection Time: 06/16/24 11:01 AM  Result Value Ref Range   Glucose-Capillary 144 (H) 70 - 99 mg/dL    Comment: Glucose reference range applies only to samples taken after fasting for at least 8 hours.     PHQ2/9:    06/26/2024    9:59 AM 05/25/2024    3:30 PM 05/05/2024    1:18 PM 01/05/2024    1:14 PM 09/07/2023    1:12 PM  Depression screen PHQ 2/9  Decreased Interest 0 0 0 0 0  Down, Depressed, Hopeless 0 0 0 0 0  PHQ - 2 Score 0 0 0 0 0   Altered sleeping 0 0  0 0  Tired, decreased energy 0 0  0 0  Change in appetite 0 0  0 0  Feeling bad or failure about yourself  0 0  0 0  Trouble concentrating 0 0  0 0  Moving slowly or fidgety/restless 0 0  0 0  Suicidal thoughts 0 0  0 0  PHQ-9 Score 0 0  0 0  Difficult doing work/chores Not difficult at all Not difficult at all  Not difficult at all     phq 9 is negative  Fall Risk:    06/26/2024    9:59 AM 05/21/2024   10:52 AM 05/05/2024    1:18 PM 01/05/2024    1:14 PM 09/07/2023    1:11 PM  Fall Risk   Falls in the past year? 0 0 0 0 0  Number falls in past yr: 0 0 0 0 0  Injury with Fall? 0 0 0 0 0  Risk for fall due to : No Fall Risks No Fall Risks No Fall Risks No Fall Risks Impaired balance/gait  Follow up Falls evaluation completed Falls evaluation completed;Falls prevention discussed Falls evaluation completed Falls prevention discussed;Education provided;Falls evaluation completed Falls prevention discussed      Assessment & Plan Atrial fibrillation, resolved, on anticoagulation Atrial fibrillation episode resolved. Currently on Coumadin . Evaluating switch to Xarelto; cost is a concern. Cardiologist to assess continued anticoagulation need. - Continue Coumadin  until cardiology appointment. - Check INR today for dose adjustment.( Level was 1.6 - therefore we will adjust dose to 5 mg daily and two tablets twice a week )  - Provide 7-day supply of Coumadin  and Cardizem . - Follow up with cardiologist on September 15th.  Type 2 diabetes mellitus with diabetic chronic kidney disease - exacerbated due to recent illness - had acute kidney failure  Diabetes management ongoing. Reports low blood sugar without symptoms. Monitoring with glucose meter. Kidney function normal at  discharge. - Send prescription for glucose meter and lancets via mail order. - Advise to check blood sugar twice daily, especially if symptomatic. - Monitor kidney function as needed.  UTI with  sepsis -hospital follow up done -medication reconciliation done -urine culture repeated per patient request  Acute upper respiratory infection, resolving Symptoms of upper respiratory infection resolving. Significant improvement noted. - Continue using Coricidin HBP for symptom relief. - Use saline spray as needed for nasal congestion.  Follow-Up Follow-up plans discussed for continuity of care and monitoring. - Follow up with cardiologist on September 15th. - Check INR today for dose adjustment. - Send prescriptions for glucose meter and lancets via mail order.

## 2024-06-26 NOTE — Telephone Encounter (Unsigned)
 Copied from CRM [id number]. Topic: General - Other >> Jun 26, 2024 10:48 AM Terri G wrote: Reason for CRM: Shaun from home health called stated they need patient redemption of care paperwork faxed over. Fax number is [phone]

## 2024-06-27 NOTE — Telephone Encounter (Signed)
 Our office do not have these forms, faxing form to let Shaun know we do not have and he can fax it to us .

## 2024-06-28 ENCOUNTER — Ambulatory Visit: Payer: Self-pay | Admitting: Family Medicine

## 2024-06-28 LAB — CULTURE, URINE COMPREHENSIVE
MICRO NUMBER:: 16937167
SPECIMEN QUALITY:: ADEQUATE

## 2024-07-03 DIAGNOSIS — E782 Mixed hyperlipidemia: Secondary | ICD-10-CM | POA: Diagnosis not present

## 2024-07-03 DIAGNOSIS — I4819 Other persistent atrial fibrillation: Secondary | ICD-10-CM | POA: Diagnosis not present

## 2024-07-03 DIAGNOSIS — I10 Essential (primary) hypertension: Secondary | ICD-10-CM | POA: Diagnosis not present

## 2024-07-04 ENCOUNTER — Telehealth: Payer: Self-pay

## 2024-07-04 NOTE — Telephone Encounter (Signed)
 Copied from CRM [id number]. Topic: Clinical - Prescription Issue >> Jul 04, 2024  7:56 AM Precious C wrote: Reason for CRM: Patient called in stating she was prescribed Eliquis  by another provider. She would like to know if her PCP can prescribe this medication for her, as it may be more affordable. Patient is requesting a call back with guidance on how to proceed. Callback number: [phone]

## 2024-07-04 NOTE — Telephone Encounter (Signed)
 Pt notified- verbalized understanding.

## 2024-07-05 ENCOUNTER — Other Ambulatory Visit: Payer: Self-pay | Admitting: Pharmacist

## 2024-07-05 DIAGNOSIS — I4891 Unspecified atrial fibrillation: Secondary | ICD-10-CM

## 2024-07-05 NOTE — Progress Notes (Unsigned)
   07/05/2024  Patient ID: Amber Avery, female   DOB: [birth year], 78 y.o.   MRN: [id number]  Receive a voicemail from patient requesting a call back regarding medication access for Eliquis .  From review of chart, note patient admitted to [facility] on 06/06/2024 related to UTI/sepsis. During hospitalization patient started on warfarin for atrial fibrillation. At appointment with Cardiology on 07/03/2024, patient switched to Eliquis  5 mg twice daily as patient reported that she did not want regular INR monitoring. Patient noted difficulty with affordability and Cardiologist provided 1 month free trial card as well as discussed contacting [place] pharmacy to see if more affordable.  Today patient shares that she went ahead and ordered initial 30-day supply of Eliquis  from local [facility] and expects this delivery to arrive today. States that she did not use free 67-month trial card as provided by Cardiologist, rather paid cost (>$100) through insurance (due to her deductible)  From review of [employer] Medicare website, note that Eliquis  is a tier 3 medication for patient. Patient has a $375 annual deductible (impacting tiers 3-5) for this plan and after deductible, tier 3 copayment is $45 for 1 month supply ($135 for 3 month supply) from preferred retail pharmacy, but is $90 for 3 month supply if received from preferred mail order pharmacy. - Patient states will consider using preferred mail order for cost savings after deductible.  Patient declines to review medications with me today as not feeling well (recovering from recent cold). Agreeable to scheduled follow up appointment to review medications with Clinic Pharmacist. Patient prefers to meet with pharmacist via telephone, rather that in person, for initial appointment. Advise patient to have her medications with her for this appointment.  Follow Up Plan: Schedule follow up for patient to speak with Clinical Pharmacist  Peyton Ferries by telephone on 07/18/2024 at 2:00 PM       Sharyle Sia, PharmD, [facility] [phone]

## 2024-07-07 NOTE — Patient Instructions (Signed)
 Your next pharmacist appointment is with Clinical Pharmacist Peyton Ferries by telephone on 07/18/2024 at 2:00 PM. Please have your medications with you to review during this telephone call.  Thank you!  Sharyle Sia, PharmD, [facility] [phone]

## 2024-07-18 ENCOUNTER — Telehealth: Payer: Self-pay

## 2024-07-18 ENCOUNTER — Other Ambulatory Visit (INDEPENDENT_AMBULATORY_CARE_PROVIDER_SITE_OTHER)

## 2024-07-18 DIAGNOSIS — N183 Chronic kidney disease, stage 3 unspecified: Secondary | ICD-10-CM

## 2024-07-18 DIAGNOSIS — E1122 Type 2 diabetes mellitus with diabetic chronic kidney disease: Secondary | ICD-10-CM

## 2024-07-18 DIAGNOSIS — Z7985 Long-term (current) use of injectable non-insulin antidiabetic drugs: Secondary | ICD-10-CM

## 2024-07-18 DIAGNOSIS — I4891 Unspecified atrial fibrillation: Secondary | ICD-10-CM

## 2024-07-18 DIAGNOSIS — Z7984 Long term (current) use of oral hypoglycemic drugs: Secondary | ICD-10-CM

## 2024-07-18 NOTE — Telephone Encounter (Signed)
 Copied from CRM #[id number]. Topic: Clinical - Home Health Verbal Orders >> Jul 18, 2024 10:06 AM Winona SAUNDERS wrote: Caller/Agency: [facility]  Callback Number: [phone]- Direct line, may leave a message  Service Requested: Home Health , Nurse, physical Therapy  Frequency:TBD Any new concerns about the patient? No

## 2024-07-18 NOTE — Progress Notes (Signed)
 S:     Chief Complaint  Patient presents with   Medication Management    Reason for visit: ?  Amber Avery is a 78 y.o. female with a history of diabetes (type 2), who presents today for a follow up diabetes pharmacotherapy visit.? Pertinent PMH also includes HTN, CAD, Afib, OSA, asthma, gout, HLD, obesity.   Care Team: Primary Care Provider: Sowles, Krichna, MD  Patient was admitted to [facility] from 8/19-8/29/25 with sepsis d/t gram-negative UTI. She was also found to have Afib with RVR at that time. She was discharged home on warfarin and Cardizem  CD.   At last visit with cardiology on 07/03/24, she reported non-adherence to warfarin d/t the need for INR monitoring. She was started on Eliquis  at that time.   Per note from clinical pharmacist Eliquis  is a tier 3 medication for patient. Patient has a $375 annual deductible (impacting tiers 3-5) for this plan and after deductible, tier 3 copayment is $45 for 1 month supply ($135 for 3 month supply) from preferred retail pharmacy, but is $90 for 3 month supply if received from preferred mail order pharmacy.   Current diabetes medications include: Farxiga  10 mg daily, Trulicity  4.5 mg weekly, metformin  XR 750 mg daily Current hypertension medications include: Cardizem  CD 240 mg daily, furosemide  20 mg daily, metoprolol  tartrate 50 mg BID Current hyperlipidemia medications include: atorvastatin  20 mg daily   Patient reports adherence to taking all medications as prescribed.   Have you been experiencing any side effects to the medications prescribed? no Do you have any problems obtaining medications due to transportation or finances? yes Insurance coverage: BCBS Medicare  Patient denies hypoglycemic events.  Reported home fasting blood sugars: 119-135 mg/dL    DM Prevention:  Statin: Taking; moderate intensity.?  History of albuminuria? yes, last UACR on 09/07/23 = 30 mg/g Last eye exam:  Lab Results  Component Value Date    HMDIABEYEEXA No Retinopathy 09/23/2023   Tobacco Use:  Tobacco Use: Medium Risk (07/04/2024)   Received from [facility]   Patient History    Smoking Tobacco Use: Former    Smokeless Tobacco Use: Never    Passive Exposure: Not on file   O:  Vitals:  Wt Readings from Last 3 Encounters:  06/26/24 249 lb (112.9 kg)  06/12/24 232 lb 14.4 oz (105.6 kg)  05/05/24 247 lb 12.8 oz (112.4 kg)   BP Readings from Last 3 Encounters:  06/26/24 116/72  06/16/24 102/62  05/05/24 106/66   Pulse Readings from Last 3 Encounters:  06/26/24 78  06/16/24 73  05/05/24 72     Labs:?  Lab Results  Component Value Date   HGBA1C 7.3 (A) 05/05/2024   HGBA1C 7.1 (A) 01/05/2024   HGBA1C 7.4 (A) 09/07/2023   GLUCOSE 145 (H) 06/16/2024   MICRALBCREAT 30 (H) 09/07/2023   MICRALBCREAT 30 (H) 08/19/2022   MICRALBCREAT 41 (H) 12/17/2021   CREATININE 0.96 06/16/2024   CREATININE 0.85 06/15/2024   CREATININE 0.85 06/14/2024    Lab Results  Component Value Date   CHOL 152 09/07/2023   LDLCALC 68 09/07/2023   LDLCALC 59 08/19/2022   LDLCALC 83 12/17/2021   HDL 60 09/07/2023   TRIG 154 (H) 09/07/2023   TRIG 147 08/19/2022   TRIG 128 12/17/2021   ALT 54 (H) 06/06/2024   ALT 13 09/07/2023   AST 83 (H) 06/06/2024   AST 16 09/07/2023      Chemistry      Component Value  Date/Time   NA 142 06/16/2024 0716   NA 142 07/31/2016 0910   K 3.9 06/16/2024 0716   CL 110 06/16/2024 0716   CO2 23 06/16/2024 0716   BUN 30 (H) 06/16/2024 0716   BUN 39 (H) 07/31/2016 0910   CREATININE 0.96 06/16/2024 0716   CREATININE 1.32 (H) 05/05/2024 1436   CREATININE 1.43 (H) 05/05/2024 1436      Component Value Date/Time   CALCIUM  9.3 06/16/2024 0716   ALKPHOS 86 06/06/2024 2259   AST 83 (H) 06/06/2024 2259   ALT 54 (H) 06/06/2024 2259   BILITOT 1.3 (H) 06/06/2024 2259   BILITOT 0.4 07/31/2016 0910       The 10-year ASCVD risk score (Arnett DK, et al., 2019) is: 41.8%  Lab Results   Component Value Date   MICRALBCREAT 30 (H) 09/07/2023   MICRALBCREAT 30 (H) 08/19/2022   MICRALBCREAT 41 (H) 12/17/2021   MICRALBCREAT NOTE 12/13/2020   MICRALBCREAT 13 12/14/2019   MICRALBCREAT 18 04/14/2019    A/P: Diabetes currently controlled with a most recent A1c of 7.3% on 05/05/24 (goal <7-7.5% given age and comorbidities). Patient is able to verbalize appropriate hypoglycemia management plan. Medication adherence appears appropriate.  -Continued GLP-1 Trulicity  (dulaglutide ) 4.5 mg weekly .  -Continued SGLT2-I Farxiga  (dapagliflozin )10 mg daily.  -Continued metformin  XR 750 mg daily.  -Extensively discussed pathophysiology of diabetes, recommended lifestyle interventions, dietary effects on blood sugar control.  -Counseled on s/sx of and management of hypoglycemia.   ASCVD risk - secondary prevention in patient with diabetes. Last LDL is 68 mg/dL, not at goal of <44 mg/dL.  -Continued atorvastatin  20 mg daily.    Afib - currently on rate control and AC therapy. CHADSVASc of 5. Patient reported some cost concerns at the pharmacy -Recommended filling Eliquis  via mail order pharmacy for a 3 month supply for decreased cost.  Patient verbalized understanding of treatment plan. Total time patient counseling 45 minutes.  Follow-up:  Pharmacist on 09/05/24 PCP clinic visit on 09/05/24  [name], PharmD Clinical Pharmacist [facility] [phone]

## 2024-07-18 NOTE — Telephone Encounter (Signed)
 VO given.

## 2024-07-19 DIAGNOSIS — E119 Type 2 diabetes mellitus without complications: Secondary | ICD-10-CM | POA: Diagnosis not present

## 2024-07-25 ENCOUNTER — Ambulatory Visit: Payer: Self-pay

## 2024-07-25 NOTE — Telephone Encounter (Signed)
 FYI Only or Action Required?: FYI only for provider.  Patient was last seen in primary care on 06/26/2024 by Glenard Mire, MD.  Called Nurse Triage reporting Pain.  Symptoms began x 2 weeks.  Interventions attempted: OTC medications:  SABRA  Symptoms are: gradually worsening.  Triage Disposition: See PCP When Office is Open (Within 3 Days)  Patient/caregiver understands and will follow disposition?: Yes   Copied from CRM #[id number]. Topic: Clinical - Red Word Triage >> Jul 25, 2024 10:50 AM Carlatta H wrote: [facility] that prompted transfer to Nurse Triage: Patients is experiencing pain in ankles that shoots to knees//Swelling and redness// Reason for Disposition  [1] MODERATE pain (e.g., interferes with normal activities, limping) AND [2] present > 3 days  Answer Assessment - Initial Assessment Questions 1. ONSET: When did the pain start?      X ongoing and worsening x 2 weeks 2. LOCATION: Where is the pain located?      Right ankle - red &painful & radiates to knee, feels hot - right is worse than left      Left ankle - red, painful & radiates to knee 3. PAIN: How bad is the pain?    (Scale 1-10; or mild, moderate, severe)     8 or 9/10 4. WORK OR EXERCISE: Has there been any recent work or exercise that involved this part of the body?      no 5. CAUSE: What do you think is causing the leg pain?     unsure 6. OTHER SYMPTOMS: Do you have any other symptoms? (e.g., chest pain, back pain, breathing difficulty, swelling, rash, fever, numbness, weakness)     Swelling to feet and ankles, more difficult to walk 7. PREGNANCY: Is there any chance you are pregnant? When was your last menstrual period?     na  Protocols used: Leg Pain-A-AH

## 2024-07-25 NOTE — Progress Notes (Signed)
 Amber Avery                                          MRN: [id number]   07/25/2024   The VBCI Quality Team Specialist reviewed this patient medical record for the purposes of chart review for care gap closure. The following were reviewed: chart review for care gap closure-kidney health evaluation for diabetes:eGFR  and uACR.    VBCI Quality Team

## 2024-07-27 ENCOUNTER — Other Ambulatory Visit: Payer: Self-pay

## 2024-07-27 ENCOUNTER — Encounter: Payer: Self-pay | Admitting: Internal Medicine

## 2024-07-27 ENCOUNTER — Telehealth: Payer: Self-pay

## 2024-07-27 ENCOUNTER — Ambulatory Visit: Admitting: Internal Medicine

## 2024-07-27 VITALS — BP 124/82 | HR 87 | Temp 97.7°F | Resp 16

## 2024-07-27 DIAGNOSIS — R600 Localized edema: Secondary | ICD-10-CM | POA: Diagnosis not present

## 2024-07-27 DIAGNOSIS — L03115 Cellulitis of right lower limb: Secondary | ICD-10-CM | POA: Diagnosis not present

## 2024-07-27 DIAGNOSIS — N1831 Chronic kidney disease, stage 3a: Secondary | ICD-10-CM | POA: Diagnosis not present

## 2024-07-27 DIAGNOSIS — R350 Frequency of micturition: Secondary | ICD-10-CM | POA: Diagnosis not present

## 2024-07-27 DIAGNOSIS — E1129 Type 2 diabetes mellitus with other diabetic kidney complication: Secondary | ICD-10-CM | POA: Diagnosis not present

## 2024-07-27 LAB — POCT URINALYSIS DIPSTICK
Bilirubin, UA: NEGATIVE
Blood, UA: NEGATIVE
Glucose, UA: POSITIVE — AB
Ketones, UA: NEGATIVE
Leukocytes, UA: NEGATIVE
Nitrite, UA: NEGATIVE
Protein, UA: NEGATIVE
Spec Grav, UA: 1.02
Urobilinogen, UA: 0.2 U/dL
pH, UA: 5

## 2024-07-27 MED ORDER — FUROSEMIDE 20 MG PO TABS: 20.0000 mg | ORAL_TABLET | Freq: Two times a day (BID) | ORAL | 1 refills | Status: AC

## 2024-07-27 MED ORDER — DOXYCYCLINE HYCLATE 100 MG PO TABS: 100.0000 mg | ORAL_TABLET | Freq: Two times a day (BID) | ORAL | 0 refills | Status: AC

## 2024-07-27 NOTE — Progress Notes (Signed)
 Acute Office Visit  Subjective:     Patient ID: Amber Avery, female    DOB: [birth year], 77 y.o.   MRN: [id number]  Chief Complaint  Patient presents with   Cellulitis    Bilateral feet swelling, red, itching     HPI Patient is in today for bilateral leg swelling. She is a patient of Dr. Glenard and this is my first time meeting her. She is here with her sister today.   Discussed the use of AI scribe software for clinical note transcription with the patient, who gave verbal consent to proceed.  History of Present Illness Amber Avery is a 78 year old female with atrial fibrillation who presents with leg pain and swelling.  She experiences leg pain and swelling, particularly in the right leg, with pain radiating from the ankle upward. The area is warm to the touch. She has difficulty elevating her legs due to physical limitations and finds compression stockings challenging to wear. She takes Lasix , two pills in the morning, to manage fluid retention.  She was recently hospitalized for a severe urinary tract infection and developed atrial fibrillation during her stay. The hospitalization lasted nine days, followed by a week in rehabilitation.  She notes urinary symptoms, with urine feeling hot, raising concerns about a possible recurrent urinary tract infection. She is not currently on antibiotics and has no known allergies to them.  Her current medications include Lasix , taken as two pills in the morning, although she is supposed to take one in the morning and one at lunch. She has difficulty adhering to this schedule due to forgetfulness.    Review of Systems  Constitutional:  Negative for chills and fever.  Respiratory:  Negative for shortness of breath.   Cardiovascular:  Positive for leg swelling.  Genitourinary:  Positive for frequency and urgency. Negative for dysuria.        Objective:    BP 124/82 (Cuff Size: Large)   Pulse 87   Temp 97.7 F (36.5 C) (Oral)    Resp 16   SpO2 93%  BP Readings from Last 3 Encounters:  07/27/24 124/82  06/26/24 116/72  06/16/24 102/62   Wt Readings from Last 3 Encounters:  06/26/24 249 lb (112.9 kg)  06/12/24 232 lb 14.4 oz (105.6 kg)  05/05/24 247 lb 12.8 oz (112.4 kg)      Physical Exam Constitutional:      Appearance: Normal appearance.  HENT:     Head: Normocephalic and atraumatic.  Eyes:     Conjunctiva/sclera: Conjunctivae normal.  Cardiovascular:     Rate and Rhythm: Normal rate and regular rhythm.  Pulmonary:     Effort: Pulmonary effort is normal.     Breath sounds: Normal breath sounds.  Musculoskeletal:     Right lower leg: Edema present.     Left lower leg: Edema present.     Comments: Bilateral 1+ pitting edema in the lower legs and feet with erythema and warmth to the touch  Skin:    General: Skin is warm and dry.  Neurological:     General: No focal deficit present.     Mental Status: She is alert. Mental status is at baseline.  Psychiatric:        Mood and Affect: Mood normal.        Behavior: Behavior normal.     Results for orders placed or performed in visit on 07/27/24  POCT Urinalysis Dipstick  Result Value Ref Range   Color, UA  yellow    Clarity, UA cloudy    Glucose, UA Positive (A) Negative   Bilirubin, UA negative    Ketones, UA negative    Spec Grav, UA 1.020 1.010 - 1.025   Blood, UA negative    pH, UA 5.0 5.0 - 8.0   Protein, UA Negative Negative   Urobilinogen, UA 0.2 0.2 or 1.0 E.U./dL   Nitrite, UA negtaive    Leukocytes, UA Negative Negative   Appearance cloudy    Odor none         Assessment & Plan:   Assessment & Plan Edema of both lower extremities Bilateral lower extremity edema likely due to chronic fluid retention. Significant pitting edema. Differential includes cellulitis, but unlikely due to bilateral presentation. Risk of skin breakdown and infection if unmanaged. Current diuretic regimen may be insufficient. - Increase Lasix  to 60  mg daily: 40 mg in the morning and 20 mg in the afternoon. - Check kidney function today to ensure safety of increased diuretic dosage. - Encourage use of compression stockings to reduce fluid retention. - Advise elevation of legs using pillows to aid fluid reduction. - Prescribe doxycycline  as a precaution against potential cellulitis. - Schedule follow-up in two weeks to reassess edema and adjust treatment.  Recent Urinary tract infection Current symptoms include sensation of heat during urination, raising concern for possible recurrent infection. - Perform urinalysis to assess for current urinary tract infection which was negative for leukocytes and nitrates.   - Basic Metabolic Panel (BMET) - furosemide  (LASIX ) 20 MG tablet; Take 1 tablet (20 mg total) by mouth 2 (two) times daily. Second dose around noon  Dispense: 180 tablet; Refill: 1 - B Nat Peptide - doxycycline  (VIBRA -TABS) 100 MG tablet; Take 1 tablet (100 mg total) by mouth 2 (two) times daily for 10 days.  Dispense: 20 tablet; Refill: 0 - POCT Urinalysis Dipstick   Return in about 2 weeks (around 08/10/2024) for Dr. Glenard or me.  Sharyle Fischer, DO

## 2024-07-27 NOTE — Telephone Encounter (Signed)
 Gave [employer] a call,[facility] pt has not received her refill order,spoke with representative said they mail out on 9/22 x90 to her home address.

## 2024-07-28 ENCOUNTER — Ambulatory Visit: Payer: Self-pay | Admitting: Internal Medicine

## 2024-07-28 LAB — BASIC METABOLIC PANEL WITH GFR
BUN/Creatinine Ratio: 20 (calc) (ref 6–22)
BUN: 27 mg/dL — ABNORMAL HIGH (ref 7–25)
CO2: 30 mmol/L (ref 20–32)
Calcium: 9.4 mg/dL (ref 8.6–10.4)
Chloride: 101 mmol/L (ref 98–110)
Creat: 1.32 mg/dL — ABNORMAL HIGH (ref 0.60–1.00)
Glucose, Bld: 176 mg/dL — ABNORMAL HIGH (ref 65–99)
Potassium: 4 mmol/L (ref 3.5–5.3)
Sodium: 143 mmol/L (ref 135–146)
eGFR: 42 mL/min/{1.73_m2} — ABNORMAL LOW

## 2024-07-28 LAB — BRAIN NATRIURETIC PEPTIDE: Brain Natriuretic Peptide: 231 pg/mL — ABNORMAL HIGH

## 2024-07-30 ENCOUNTER — Other Ambulatory Visit: Payer: Self-pay | Admitting: Family Medicine

## 2024-07-30 DIAGNOSIS — M5416 Radiculopathy, lumbar region: Secondary | ICD-10-CM

## 2024-07-31 ENCOUNTER — Other Ambulatory Visit (HOSPITAL_COMMUNITY): Payer: Self-pay

## 2024-07-31 ENCOUNTER — Telehealth: Payer: Self-pay

## 2024-07-31 DIAGNOSIS — N1831 Chronic kidney disease, stage 3a: Secondary | ICD-10-CM

## 2024-07-31 DIAGNOSIS — E1122 Type 2 diabetes mellitus with diabetic chronic kidney disease: Secondary | ICD-10-CM

## 2024-07-31 MED ORDER — DAPAGLIFLOZIN PROPANEDIOL 10 MG PO TABS: 10.0000 mg | ORAL_TABLET | Freq: Every day | ORAL | 3 refills | Status: AC

## 2024-07-31 NOTE — Telephone Encounter (Signed)
 Received a reill request from [employer] on Farxiga  can be fax to [facility] at [phone].

## 2024-08-02 DIAGNOSIS — M47812 Spondylosis without myelopathy or radiculopathy, cervical region: Secondary | ICD-10-CM | POA: Diagnosis not present

## 2024-08-02 DIAGNOSIS — M48062 Spinal stenosis, lumbar region with neurogenic claudication: Secondary | ICD-10-CM | POA: Diagnosis not present

## 2024-08-02 DIAGNOSIS — M5416 Radiculopathy, lumbar region: Secondary | ICD-10-CM | POA: Diagnosis not present

## 2024-08-02 DIAGNOSIS — M5412 Radiculopathy, cervical region: Secondary | ICD-10-CM | POA: Diagnosis not present

## 2024-08-07 DIAGNOSIS — I4819 Other persistent atrial fibrillation: Secondary | ICD-10-CM | POA: Diagnosis not present

## 2024-08-07 DIAGNOSIS — E782 Mixed hyperlipidemia: Secondary | ICD-10-CM | POA: Diagnosis not present

## 2024-08-07 DIAGNOSIS — I10 Essential (primary) hypertension: Secondary | ICD-10-CM | POA: Diagnosis not present

## 2024-08-08 ENCOUNTER — Telehealth: Payer: Self-pay

## 2024-08-08 NOTE — Telephone Encounter (Signed)
 Brief Telephone Documentation Reason for Call: Patient left message requesting refill of Trulicity   Summary of Call: Contacted patient to inform her that refills should be on file with the patient assistance pharmacy, [facility]. Provided pharmacy phone number to request refill.  Patient inquired about status of re-enrollment for Trulicity  and Farxiga . Will route to CPhT at this time.  Follow Up: Patient given direct line for further questions/concerns.  Jenilyn Magana E. Marsh, PharmD Clinical Pharmacist [facility] [phone]

## 2024-08-09 ENCOUNTER — Telehealth: Payer: Self-pay

## 2024-08-09 ENCOUNTER — Other Ambulatory Visit: Payer: Medicare Other | Admitting: Pharmacist

## 2024-08-09 NOTE — Telephone Encounter (Signed)
 Gave pt a call pt is coming up due for re-enrollment on [employer] Trulicity  and [employer] Farxiga ,spoke with pt will mail out the [employer] application and [employer] pt has been pre=approved and will call [employer] to give consent to 2025 re-enroll ment. Faxed ptovider portion [employer] today

## 2024-08-09 NOTE — Telephone Encounter (Signed)
 Received approval letter [employer] Farxiga  thru 10/18/2025 approval letter was index.

## 2024-08-10 NOTE — Telephone Encounter (Signed)
 Received provider portion [employer] Trulicity  waiting on pt portion along proof of income.

## 2024-08-14 ENCOUNTER — Ambulatory Visit
Admission: RE | Admit: 2024-08-14 | Discharge: 2024-08-14 | Disposition: A | Discharge status: Home / Self Care | Source: Ambulatory Visit | Attending: Family Medicine | Admitting: Family Medicine

## 2024-08-14 DIAGNOSIS — Z1231 Encounter for screening mammogram for malignant neoplasm of breast: Secondary | ICD-10-CM | POA: Diagnosis present

## 2024-08-17 ENCOUNTER — Ambulatory Visit: Admitting: Family Medicine

## 2024-08-17 ENCOUNTER — Encounter: Payer: Self-pay | Admitting: Family Medicine

## 2024-08-17 VITALS — BP 110/72 | HR 61 | Resp 16 | Ht 61.0 in | Wt 249.0 lb

## 2024-08-17 DIAGNOSIS — G4733 Obstructive sleep apnea (adult) (pediatric): Secondary | ICD-10-CM | POA: Diagnosis not present

## 2024-08-17 DIAGNOSIS — N183 Chronic kidney disease, stage 3 unspecified: Secondary | ICD-10-CM

## 2024-08-17 DIAGNOSIS — E1122 Type 2 diabetes mellitus with diabetic chronic kidney disease: Secondary | ICD-10-CM | POA: Diagnosis not present

## 2024-08-17 DIAGNOSIS — R600 Localized edema: Secondary | ICD-10-CM

## 2024-08-17 NOTE — Progress Notes (Signed)
 Name: Amber Avery   MRN: [id number]    DOB: Nov 11, 1945   Date:08/17/2024       Progress Note  Subjective  Chief Complaint  Chief Complaint  Patient presents with   Follow-up   Discussed the use of AI scribe software for clinical note transcription with the patient, who gave verbal consent to proceed.  History of Present Illness Amber Avery is a 78 year old female with chronic fluid retention who presents for follow-up of lower extremity edema.  She experiences persistent swelling in her legs, particularly in the ankles, which has not resolved since her last hospital visit. The swelling is described as 'tight' and worsens at night, causing her to wake up at 3 AM due to pain. She applies Voltron for relief. The swelling is associated with redness and a history of cellulitis, with the left leg being more affected than the right.  She was previously hospitalized and discharged on June 16, 2024, for sepsis due to a blood infection. During a follow-up in October, she was noted to have significant pitting edema and was prescribed Lasix  60 mg twice daily for ten days. She is currently taking Lasix  20 mg in am's. Her blood work showed a slightly elevated brain natriuretic peptide and a temporary drop in kidney function.  She experiences difficulty with her current compression stockings, describing them as too tight and causing discomfort. She mentions that they were measured for her size but feels they act like a 'tourniquet'.  No shortness of breath and she is able to lay flat at night. She uses a CPAP machine for sleep apnea and sleeps on her side. She has a sore on her nose from her glasses, which heals and recurs over six months.  She has a history of trochanteric bursitis and is scheduled for a hip injection next month. She also plans to have her eyes checked and has recently had a mammogram. She reports no current issues with her diabetes and is taking gabapentin .    Patient Active  Problem List   Diagnosis Date Noted   Atherosclerosis of aorta 06/08/2024   Sepsis due to gram-negative UTI (HCC) 06/07/2024   Atrial fibrillation with rapid ventricular response (HCC) 06/07/2024   Controlled type 2 diabetes mellitus without complication, without long-term current use of insulin  (HCC) 06/07/2024   Gout 06/07/2024   Chronic pain syndrome 01/05/2024   B12 deficiency 01/05/2024   Secondary hyperparathyroidism of renal origin 01/05/2024   Greater trochanteric bursitis, left 06/18/2022   Gait instability 12/13/2020   Stage 3a chronic kidney disease (HCC) 12/13/2020   Morbid obesity with BMI of 45.0-49.9, adult (HCC) 02/06/2020   History of gastritis 08/14/2019   Angiodysplasia of stomach and duodenum    Iron  deficiency anemia 04/20/2018   Lichen sclerosus of female genitalia 06/29/2016   Primary osteoarthritis of both knees 04/07/2016   Right thyroid  nodule 06/26/2015   Asthma, well controlled 04/03/2015   Benign essential HTN 04/03/2015   Carpal tunnel syndrome 04/03/2015   Chronic kidney disease (CKD), stage II (mild) 04/03/2015   Controlled gout 04/03/2015   Arteriosclerosis of coronary artery 04/03/2015   Diabetes mellitus with renal manifestations, controlled (HCC) 04/03/2015   Dyslipidemia 04/03/2015   Bilateral edema of lower extremity 04/03/2015   Knee pain 04/03/2015   Lumbar radiculitis 04/03/2015   OSA on CPAP 04/03/2015   Lumbosacral spondylosis 04/03/2015   Osteoarthritis, chronic 04/03/2015   Psoriasis 04/03/2015   Vitamin D  deficiency 04/03/2015   Varicose veins 04/03/2015  Goiter 12/23/2014   Degeneration of intervertebral disc of lumbar region 08/23/2014    Past Surgical History:  Procedure Laterality Date   APPENDECTOMY     CATARACT EXTRACTION W/PHACO Left 08/14/2020   Procedure: CATARACT EXTRACTION PHACO AND INTRAOCULAR LENS PLACEMENT (IOC) LEFT DIABETIC 3.56 00:59.6 6.0%;  Surgeon: Mittie Gaskin, MD;  Location: [facility];   Service: Ophthalmology;  Laterality: Left;  Diabetic   CATARACT EXTRACTION W/PHACO Right 09/04/2020   Procedure: CATARACT EXTRACTION PHACO AND INTRAOCULAR LENS PLACEMENT (IOC) RIGHT DIABETIC;  Surgeon: Mittie Gaskin, MD;  Location: [facility];  Service: Ophthalmology;  Laterality: Right;  4.72 1:03.2 7.4%   CHOLECYSTECTOMY  10/20/1975   COLONOSCOPY WITH PROPOFOL  N/A 01/21/2016   Procedure: COLONOSCOPY WITH PROPOFOL ;  Surgeon: Rogelia Copping, MD;  Location: [facility];  Service: Endoscopy;  Laterality: N/A;   DILATION AND CURETTAGE OF UTERUS     Due to Amenorrhea   ESOPHAGOGASTRODUODENOSCOPY (EGD) WITH PROPOFOL  N/A 08/16/2018   Procedure: ESOPHAGOGASTRODUODENOSCOPY (EGD) WITH PROPOFOL ;  Surgeon: Copping Rogelia, MD;  Location: [facility];  Service: Endoscopy;  Laterality: N/A;   EYE SURGERY     HEMORRHOID BANDING  10/19/1978   HEMORROIDECTOMY     HERNIA REPAIR  10/19/2009   Temporal Area Excision Biopsy     For Birth Mark Changes, Negative Pathology   TONSILLECTOMY AND ADENOIDECTOMY      Family History  Problem Relation Age of Onset   Cancer Mother        brain tumor   Kidney disease Mother    Hypertension Mother    Heart disease Father    COPD Father    Hearing loss Father    Hypertension Sister    Arthritis Sister    Cancer Sister    Heart murmur Sister    GER disease Sister    Arthritis Sister    Lupus Sister    Diabetes Sister    Arthritis Sister    Osteopenia Sister    Heart disease Sister    Hypertension Sister    Hearing loss Sister    Obesity Sister    Breast cancer Maternal Aunt 56   Leukemia Maternal Aunt     Social History   Tobacco Use   Smoking status: Former    Current packs/day: 0.00    Average packs/day: 2.0 packs/day for 15.0 years (30.0 ttl pk-yrs)    Types: Cigarettes    Start date: 65    Quit date: 75    Years since quitting: 42.8   Smokeless tobacco: Never   Tobacco comments:    smoking cessation materials not  required  Substance Use Topics   Alcohol  use: Not Currently    Alcohol /week: 0.0 standard drinks of alcohol      Current Outpatient Medications:    acetaminophen  (TYLENOL ) 500 MG tablet, Take 1 tablet (500 mg total) by mouth every 6 (six) hours as needed., Disp: 480 tablet, Rfl: 0   albuterol  (VENTOLIN  HFA) 108 (90 Base) MCG/ACT inhaler, Inhale 2 puffs into the lungs every 6 (six) hours as needed for wheezing or shortness of breath., Disp: 8 g, Rfl: 5   allopurinol  (ZYLOPRIM ) 100 MG tablet, Take 1 tablet (100 mg total) by mouth 2 (two) times daily., Disp: 180 tablet, Rfl: 3   apixaban  (ELIQUIS ) 5 MG TABS tablet, Take 1 tablet (5 mg total) by mouth every 12 (twelve) hours, Disp: , Rfl:    atorvastatin  (LIPITOR) 20 MG tablet, TAKE 1 TABLET BY MOUTH DAILY FOR CHOLESTEROL, Disp: 90 tablet, Rfl: 3  Blood Glucose Monitoring Suppl DEVI, 1 each by Does not apply route in the morning, at noon, and at bedtime., Disp: 1 each, Rfl: 0   Blood Pressure KIT, 1 each by Does not apply route daily., Disp: 1 kit, Rfl: 0   clobetasol  ointment (TEMOVATE ) 0.05 %, Apply 1 Application topically at bedtime. Apply to the affected area nightly for 4 weeks., Disp: , Rfl:    dapagliflozin  propanediol (FARXIGA ) 10 MG TABS tablet, Take 1 tablet (10 mg total) by mouth daily before breakfast., Disp: 90 tablet, Rfl: 3   diltiazem  (CARDIZEM  CD) 240 MG 24 hr capsule, Take 1 capsule (240 mg total) by mouth daily., Disp: 7 capsule, Rfl: 0   Dulaglutide  (TRULICITY ) 4.5 MG/0.5ML SOAJ, Inject 4.5 mg as directed once a week., Disp: 8 mL, Rfl: 2   furosemide  (LASIX ) 20 MG tablet, Take 1 tablet (20 mg total) by mouth 2 (two) times daily. Second dose around noon, Disp: 180 tablet, Rfl: 1   gabapentin  (NEURONTIN ) 300 MG capsule, TAKE 1 CAPSULE BY MOUTH IN THE MORNING, 1 CAPSULE IN THE EVENING AND 2 CAPSULES AT NIGHT, Disp: 360 capsule, Rfl: 3   Glucose Blood (BLOOD GLUCOSE TEST STRIPS) STRP, 1 each by In Vitro route daily., Disp: 90 each,  Rfl: 2   Lancet Device MISC, 1 each by Does not apply route as directed. May substitute to any manufacturer covered by patient's insurance., Disp: 1 each, Rfl: 0   Lancets (ONETOUCH ULTRASOFT) lancets, Use as instructed, Disp: 100 each, Rfl: 12   Lancets Misc. MISC, 1 each by Does not apply route daily. May substitute to any manufacturer covered by patient's insurance., Disp: 100 each, Rfl: 2   metFORMIN  (GLUCOPHAGE -XR) 750 MG 24 hr tablet, Take 1 tablet (750 mg total) by mouth daily with breakfast., Disp: 90 tablet, Rfl: 1   methocarbamol  (ROBAXIN ) 500 MG tablet, Take 500 mg by mouth 3 (three) times daily., Disp: , Rfl:    metoprolol  tartrate (LOPRESSOR ) 50 MG tablet, Take 1 tablet (50 mg total) by mouth 2 (two) times daily., Disp: , Rfl:    polyethylene glycol (MIRALAX  / GLYCOLAX ) 17 g packet, Take 17 g by mouth daily as needed for mild constipation., Disp: , Rfl:    potassium chloride  SA (KLOR-CON  M) 20 MEQ tablet, TAKE 1 TABLET BY MOUTH DAILY, Disp: 90 tablet, Rfl: 1   traMADol  (ULTRAM ) 50 MG tablet, Take 0.5-1 tablets (25-50 mg total) by mouth 2 (two) times daily as needed., Disp: 8 tablet, Rfl: 0   triamcinolone  ointment (KENALOG ) 0.1 %, APPLY TOPICALLY TWICE DAILY AS NEEDED. GENERIC EQUIVALENT FOR KENALOG , Disp: 90 g, Rfl: 0   vitamin B-12 (CYANOCOBALAMIN ) 500 MCG tablet, Take 500 mcg by mouth daily., Disp: , Rfl:    Vitamin D , Cholecalciferol , 25 MCG (1000 UT) TABS, Take 1 capsule by mouth daily., Disp: 90 tablet, Rfl: 1   VOLTAREN  1 % GEL, Apply 4 g topically 4 (four) times daily., Disp: 300 g, Rfl: 3  Allergies  Allergen Reactions   Codeine Hives and Nausea And Vomiting    I personally reviewed active problem list, medication list, allergies, family history with the patient/caregiver today.   ROS  Ten systems reviewed and is negative except as mentioned in HPI    Objective Physical Exam CONSTITUTIONAL: Patient appears well-developed and well-nourished. No distress. HEENT: Head  atraumatic, normocephalic, neck supple. CARDIOVASCULAR: Normal rate, regular rhythm and normal heart sounds. No murmur heard. BLE present, tender to touch but not erythema or increase in warmth, mark  on dorsal feet and also upper calf from tight compression stocking hoses PULMONARY: Effort normal and breath sounds normal. No respiratory distress. ABDOMINAL: There is no tenderness or distention. MUSCULOSKELETAL: using a walker, walks slowly  PSYCHIATRIC: Patient has a normal mood and affect. Behavior is normal. Judgment and thought content normal.  Vitals:   08/17/24 1117  BP: 110/72  Pulse: 61  Resp: 16  SpO2: 95%  Weight: 249 lb (112.9 kg)  Height: 5' 1 (1.549 m)    Body mass index is 47.05 kg/m.   Recent Results (from the past 2160 hours)  Lactic acid, plasma     Status: None   Collection Time: 06/06/24 10:59 PM  Result Value Ref Range   Lactic Acid, Venous 1.5 0.5 - 1.9 mmol/L    Comment: Performed at [facility], [address] [place], [place] [address]  Protime-INR     Status: None   Collection Time: 06/06/24 10:59 PM  Result Value Ref Range   Prothrombin Time 14.9 11.4 - 15.2 seconds   INR 1.1 0.8 - 1.2    Comment: (NOTE) INR goal varies based on device and disease states. Performed at [facility], [address] [place], [place] [address]   CBC with Differential/Platelet     Status: Abnormal   Collection Time: 06/06/24 10:59 PM  Result Value Ref Range   WBC 10.7 (H) 4.0 - 10.5 K/uL   RBC 4.53 3.87 - 5.11 MIL/uL   Hemoglobin 14.1 12.0 - 15.0 g/dL   HCT 57.4 63.9 - 53.9 %   MCV 93.8 80.0 - 100.0 fL   MCH 31.1 26.0 - 34.0 pg   MCHC 33.2 30.0 - 36.0 g/dL   RDW 84.2 (H) 88.4 - 84.4 %   Platelets 142 (L) 150 - 400 K/uL   nRBC 0.2 0.0 - 0.2 %   Neutrophils Relative % 79 %   Neutro Abs 8.4 (H) 1.7 - 7.7 K/uL   Lymphocytes Relative 7 %   Lymphs Abs 0.7 0.7 - 4.0 K/uL   Monocytes Relative 12 %   Monocytes Absolute 1.3 (H) 0.1 - 1.0 K/uL    Eosinophils Relative 0 %   Eosinophils Absolute 0.0 0.0 - 0.5 K/uL   Basophils Relative 0 %   Basophils Absolute 0.0 0.0 - 0.1 K/uL   Immature Granulocytes 2 %   Abs Immature Granulocytes 0.23 (H) 0.00 - 0.07 K/uL    Comment: Performed at [facility], [address] [place], [place] [address]  Comprehensive metabolic panel with GFR     Status: Abnormal   Collection Time: 06/06/24 10:59 PM  Result Value Ref Range   Sodium 133 (L) 135 - 145 mmol/L   Potassium 4.2 3.5 - 5.1 mmol/L   Chloride 98 98 - 111 mmol/L   CO2 20 (L) 22 - 32 mmol/L   Glucose, Bld 166 (H) 70 - 99 mg/dL    Comment: Glucose reference range applies only to samples taken after fasting for at least 8 hours.   BUN 46 (H) 8 - 23 mg/dL   Creatinine, Ser 8.45 (H) 0.44 - 1.00 mg/dL   Calcium  8.7 (L) 8.9 - 10.3 mg/dL   Total Protein 6.9 6.5 - 8.1 g/dL   Albumin 3.2 (L) 3.5 - 5.0 g/dL   AST 83 (H) 15 - 41 U/L   ALT 54 (H) 0 - 44 U/L   Alkaline Phosphatase 86 38 - 126 U/L   Total Bilirubin 1.3 (H) 0.0 - 1.2 mg/dL  GFR, Estimated 35 (L) >60 mL/min    Comment: (NOTE) Calculated using the CKD-EPI Creatinine Equation (2021)    Anion gap 15 5 - 15    Comment: Performed at [facility], [address] [place], [place] [address]  TSH     Status: None   Collection Time: 06/06/24 10:59 PM  Result Value Ref Range   TSH 1.951 0.350 - 4.500 uIU/mL    Comment: Performed by a 3rd Generation assay with a functional sensitivity of <=0.01 uIU/mL. Performed at [facility], [address] [place], [place] [address]   D-dimer, quantitative     Status: Abnormal   Collection Time: 06/06/24 10:59 PM  Result Value Ref Range   D-Dimer, Quant 3.83 (H) 0.00 - 0.50 ug/mL-FEU    Comment: (NOTE) At the manufacturer cut-off value of 0.5 g/mL FEU, this assay has a negative predictive value of 95-100%.This assay is intended for use in conjunction with a clinical pretest probability (PTP) assessment model to  exclude pulmonary embolism (PE) and deep venous thrombosis (DVT) in outpatients suspected of PE or DVT. Results should be correlated with clinical presentation. Performed at [facility], [address] [place], [place] [address]   Procalcitonin     Status: None   Collection Time: 06/06/24 10:59 PM  Result Value Ref Range   Procalcitonin 20.23 ng/mL    Comment:        Interpretation: PCT >= 10 ng/mL: Important systemic inflammatory response, almost exclusively due to severe bacterial sepsis or septic shock. (NOTE)       Sepsis PCT Algorithm           Lower Respiratory Tract                                      Infection PCT Algorithm    ----------------------------     ----------------------------         PCT < 0.25 ng/mL                PCT < 0.10 ng/mL          Strongly encourage             Strongly discourage   discontinuation of antibiotics    initiation of antibiotics    ----------------------------     -----------------------------       PCT 0.25 - 0.50 ng/mL            PCT 0.10 - 0.25 ng/mL               OR       >80% decrease in PCT            Discourage initiation of                                            antibiotics      Encourage discontinuation           of antibiotics    ----------------------------     -----------------------------         PCT >= 0.50 ng/mL              PCT 0.26 - 0.50 ng/mL                AND       <  80% decrease in PCT             Encourage initiation of                                             antibiotics       Encourage continuation           of antibiotics    ----------------------------     -----------------------------        PCT >= 0.50 ng/mL                  PCT > 0.50 ng/mL               AND         increase in PCT                  Strongly encourage                                      initiation of antibiotics    Strongly encourage escalation           of antibiotics                                      -----------------------------                                           PCT <= 0.25 ng/mL                                                 OR                                        > 80% decrease in PCT                                      Discontinue / Do not initiate                                             antibiotics  Performed at [facility], [address] [place], [place] [address]   Troponin I (High Sensitivity)     Status: Abnormal   Collection Time: 06/06/24 10:59 PM  Result Value Ref Range   Troponin I (High Sensitivity) 27 (H) <18 ng/L    Comment: (NOTE) Elevated high sensitivity troponin I (hsTnI) values and significant  changes across serial measurements may suggest ACS but many other  chronic and acute conditions are known to elevate hsTnI results.  Refer to the Links section for chest pain algorithms and additional  guidance. Performed at [facility], [address] [place], [place]  [address]   Magnesium      Status: None   Collection Time: 06/06/24 10:59 PM  Result Value Ref Range   Magnesium  2.0 1.7 - 2.4 mg/dL    Comment: Performed at [facility], [address] [place], [place] [address]  Culture, blood (Routine x 2)     Status: None   Collection Time: 06/06/24 11:00 PM   Specimen: BLOOD  Result Value Ref Range   Specimen Description BLOOD LEFT ANTECUBITAL    Special Requests      BOTTLES DRAWN AEROBIC AND ANAEROBIC Blood Culture results may not be optimal due to an excessive volume of blood received in culture bottles   Culture      NO GROWTH 5 DAYS Performed at [facility], [address] [place], [place] [address]    Report Status 06/11/2024 FINAL   Urinalysis, w/ Reflex to Culture (Infection Suspected) -Urine, Clean Catch     Status: Abnormal   Collection Time: 06/06/24 11:00 PM  Result Value Ref Range   Specimen Source URINE, CATHETERIZED    Color, Urine YELLOW (A) YELLOW   APPearance CLOUDY (A)  CLEAR   Specific Gravity, Urine 1.013 1.005 - 1.030   pH 5.0 5.0 - 8.0   Glucose, UA >=500 (A) NEGATIVE mg/dL   Hgb urine dipstick MODERATE (A) NEGATIVE   Bilirubin Urine NEGATIVE NEGATIVE   Ketones, ur 5 (A) NEGATIVE mg/dL   Protein, ur 899 (A) NEGATIVE mg/dL   Nitrite NEGATIVE NEGATIVE   Leukocytes,Ua SMALL (A) NEGATIVE   RBC / HPF 11-20 0 - 5 RBC/hpf   WBC, UA 21-50 0 - 5 WBC/hpf    Comment:        Reflex urine culture not performed if WBC <=10, OR if Squamous epithelial cells >5. If Squamous epithelial cells >5 suggest recollection.    Bacteria, UA MANY (A) NONE SEEN   Squamous Epithelial / HPF 0-5 0 - 5 /HPF   WBC Clumps PRESENT    Mucus PRESENT    Amorphous Crystal PRESENT     Comment: Performed at [facility], [address] [place], [place] [address]  Urine Culture     Status: Abnormal   Collection Time: 06/06/24 11:00 PM   Specimen: Urine, Random  Result Value Ref Range   Specimen Description      URINE, RANDOM Performed at [facility], [address] [place], [place] [address]    Special Requests      NONE Reflexed from [phone] Performed at [facility], [address] [place], [place] [address]    Culture >=100,000 COLONIES/mL KLEBSIELLA PNEUMONIAE (A)    Report Status 06/09/2024 FINAL    Organism ID, Bacteria KLEBSIELLA PNEUMONIAE (A)       Susceptibility   Klebsiella pneumoniae - MIC*    AMPICILLIN >=32 RESISTANT Resistant     CEFAZOLIN (URINE) Value in next row Sensitive      2 SENSITIVEThis is a modified FDA-approved test that has been validated and its performance characteristics determined by the reporting laboratory.  This laboratory is certified under the Clinical Laboratory Improvement Amendments CLIA as qualified to perform high complexity clinical laboratory testing.    CEFEPIME  Value in next row Sensitive      2 SENSITIVEThis is a modified FDA-approved test that has been validated and its performance characteristics  determined by the reporting laboratory.  This laboratory is certified under the Clinical Laboratory Improvement Amendments CLIA as qualified to perform high complexity clinical laboratory testing.  ERTAPENEM Value in next row Sensitive      2 SENSITIVEThis is a modified FDA-approved test that has been validated and its performance characteristics determined by the reporting laboratory.  This laboratory is certified under the Clinical Laboratory Improvement Amendments CLIA as qualified to perform high complexity clinical laboratory testing.    CEFTRIAXONE  Value in next row Sensitive      2 SENSITIVEThis is a modified FDA-approved test that has been validated and its performance characteristics determined by the reporting laboratory.  This laboratory is certified under the Clinical Laboratory Improvement Amendments CLIA as qualified to perform high complexity clinical laboratory testing.    CIPROFLOXACIN Value in next row Sensitive      2 SENSITIVEThis is a modified FDA-approved test that has been validated and its performance characteristics determined by the reporting laboratory.  This laboratory is certified under the Clinical Laboratory Improvement Amendments CLIA as qualified to perform high complexity clinical laboratory testing.    GENTAMICIN Value in next row Sensitive      2 SENSITIVEThis is a modified FDA-approved test that has been validated and its performance characteristics determined by the reporting laboratory.  This laboratory is certified under the Clinical Laboratory Improvement Amendments CLIA as qualified to perform high complexity clinical laboratory testing.    NITROFURANTOIN Value in next row Intermediate      2 SENSITIVEThis is a modified FDA-approved test that has been validated and its performance characteristics determined by the reporting laboratory.  This laboratory is certified under the Clinical Laboratory Improvement Amendments CLIA as qualified to perform high complexity  clinical laboratory testing.    TRIMETH/SULFA Value in next row Sensitive      2 SENSITIVEThis is a modified FDA-approved test that has been validated and its performance characteristics determined by the reporting laboratory.  This laboratory is certified under the Clinical Laboratory Improvement Amendments CLIA as qualified to perform high complexity clinical laboratory testing.    AMPICILLIN/SULBACTAM Value in next row Sensitive      2 SENSITIVEThis is a modified FDA-approved test that has been validated and its performance characteristics determined by the reporting laboratory.  This laboratory is certified under the Clinical Laboratory Improvement Amendments CLIA as qualified to perform high complexity clinical laboratory testing.    PIP/TAZO Value in next row Sensitive ug/mL     <=4 SENSITIVEThis is a modified FDA-approved test that has been validated and its performance characteristics determined by the reporting laboratory.  This laboratory is certified under the Clinical Laboratory Improvement Amendments CLIA as qualified to perform high complexity clinical laboratory testing.    MEROPENEM Value in next row Sensitive      <=4 SENSITIVEThis is a modified FDA-approved test that has been validated and its performance characteristics determined by the reporting laboratory.  This laboratory is certified under the Clinical Laboratory Improvement Amendments CLIA as qualified to perform high complexity clinical laboratory testing.    * >=100,000 COLONIES/mL KLEBSIELLA PNEUMONIAE  Culture, blood (Routine x 2)     Status: None   Collection Time: 06/06/24 11:20 PM   Specimen: BLOOD  Result Value Ref Range   Specimen Description BLOOD BLOOD LEFT ARM    Special Requests      BOTTLES DRAWN AEROBIC AND ANAEROBIC Blood Culture adequate volume   Culture      NO GROWTH 5 DAYS Performed at [facility], [address], [place], [place] [address]    Report Status 06/11/2024 FINAL   Troponin I  (High Sensitivity)     Status: Abnormal  Collection Time: 06/07/24  2:16 AM  Result Value Ref Range   Troponin I (High Sensitivity) 26 (H) <18 ng/L    Comment: (NOTE) Elevated high sensitivity troponin I (hsTnI) values and significant  changes across serial measurements may suggest ACS but many other  chronic and acute conditions are known to elevate hsTnI results.  Refer to the Links section for chest pain algorithms and additional  guidance. Performed at [facility], [address] [place], [place] [address]   Cortisol-am, blood     Status: Abnormal   Collection Time: 06/07/24  6:23 AM  Result Value Ref Range   Cortisol - AM 25.9 (H) 6.7 - 22.6 ug/dL    Comment: Performed at [facility] Lab, [address] [place], [place] [address]  CBG monitoring, ED     Status: Abnormal   Collection Time: 06/07/24  8:06 AM  Result Value Ref Range   Glucose-Capillary 159 (H) 70 - 99 mg/dL    Comment: Glucose reference range applies only to samples taken after fasting for at least 8 hours.  ECHOCARDIOGRAM COMPLETE     Status: None   Collection Time: 06/07/24  9:36 AM  Result Value Ref Range   Height 61 in   BP 137/103 mmHg   Ao pk vel 1.77 m/s   AV Area VTI 2.24 cm2   AR max vel 2.15 cm2   AV Mean grad 7.0 mmHg   AV Peak grad 12.5 mmHg   S' Lateral 3.10 cm   AV Area mean vel 2.20 cm2   Area-P 1/2 4.41 cm2   Est EF 50 - 55%   CBG monitoring, ED     Status: Abnormal   Collection Time: 06/07/24 11:32 AM  Result Value Ref Range   Glucose-Capillary 144 (H) 70 - 99 mg/dL    Comment: Glucose reference range applies only to samples taken after fasting for at least 8 hours.  CBG monitoring, ED     Status: Abnormal   Collection Time: 06/07/24  5:31 PM  Result Value Ref Range   Glucose-Capillary 196 (H) 70 - 99 mg/dL    Comment: Glucose reference range applies only to samples taken after fasting for at least 8 hours.  Glucose, capillary     Status: Abnormal   Collection  Time: 06/07/24 10:09 PM  Result Value Ref Range   Glucose-Capillary 158 (H) 70 - 99 mg/dL    Comment: Glucose reference range applies only to samples taken after fasting for at least 8 hours.  Basic metabolic panel     Status: Abnormal   Collection Time: 06/08/24  5:41 AM  Result Value Ref Range   Sodium 132 (L) 135 - 145 mmol/L   Potassium 3.9 3.5 - 5.1 mmol/L   Chloride 101 98 - 111 mmol/L   CO2 22 22 - 32 mmol/L   Glucose, Bld 177 (H) 70 - 99 mg/dL    Comment: Glucose reference range applies only to samples taken after fasting for at least 8 hours.   BUN 43 (H) 8 - 23 mg/dL   Creatinine, Ser 8.48 (H) 0.44 - 1.00 mg/dL   Calcium  8.3 (L) 8.9 - 10.3 mg/dL   GFR, Estimated 36 (L) >60 mL/min    Comment: (NOTE) Calculated using the CKD-EPI Creatinine Equation (2021)    Anion gap 9 5 - 15    Comment: Performed at [facility], [address] [place], [place] [address]  CBC     Status: Abnormal   Collection  Time: 06/08/24  5:41 AM  Result Value Ref Range   WBC 12.6 (H) 4.0 - 10.5 K/uL   RBC 4.04 3.87 - 5.11 MIL/uL   Hemoglobin 12.6 12.0 - 15.0 g/dL   HCT 62.1 63.9 - 53.9 %   MCV 93.6 80.0 - 100.0 fL   MCH 31.2 26.0 - 34.0 pg   MCHC 33.3 30.0 - 36.0 g/dL   RDW 84.2 (H) 88.4 - 84.4 %   Platelets 151 150 - 400 K/uL   nRBC 0.0 0.0 - 0.2 %    Comment: Performed at [facility], [address], [place], [place] [address]  Magnesium      Status: Abnormal   Collection Time: 06/08/24  5:41 AM  Result Value Ref Range   Magnesium  2.7 (H) 1.7 - 2.4 mg/dL    Comment: Performed at [facility], [address] [place], [place] [address]  Brain natriuretic peptide     Status: Abnormal   Collection Time: 06/08/24  5:41 AM  Result Value Ref Range   B Natriuretic Peptide 158.8 (H) 0.0 - 100.0 pg/mL    Comment: Performed at [facility], [address] [place], [place] [address]  Glucose, capillary     Status: Abnormal   Collection Time: 06/08/24  7:30  AM  Result Value Ref Range   Glucose-Capillary 142 (H) 70 - 99 mg/dL    Comment: Glucose reference range applies only to samples taken after fasting for at least 8 hours.  Glucose, capillary     Status: Abnormal   Collection Time: 06/08/24 12:08 PM  Result Value Ref Range   Glucose-Capillary 202 (H) 70 - 99 mg/dL    Comment: Glucose reference range applies only to samples taken after fasting for at least 8 hours.  Glucose, capillary     Status: Abnormal   Collection Time: 06/08/24  5:19 PM  Result Value Ref Range   Glucose-Capillary 171 (H) 70 - 99 mg/dL    Comment: Glucose reference range applies only to samples taken after fasting for at least 8 hours.  Glucose, capillary     Status: Abnormal   Collection Time: 06/08/24  8:44 PM  Result Value Ref Range   Glucose-Capillary 186 (H) 70 - 99 mg/dL    Comment: Glucose reference range applies only to samples taken after fasting for at least 8 hours.  Basic metabolic panel with GFR     Status: Abnormal   Collection Time: 06/09/24  6:06 AM  Result Value Ref Range   Sodium 135 135 - 145 mmol/L   Potassium 3.8 3.5 - 5.1 mmol/L   Chloride 104 98 - 111 mmol/L   CO2 22 22 - 32 mmol/L   Glucose, Bld 162 (H) 70 - 99 mg/dL    Comment: Glucose reference range applies only to samples taken after fasting for at least 8 hours.   BUN 55 (H) 8 - 23 mg/dL   Creatinine, Ser 8.48 (H) 0.44 - 1.00 mg/dL   Calcium  8.5 (L) 8.9 - 10.3 mg/dL   GFR, Estimated 36 (L) >60 mL/min    Comment: (NOTE) Calculated using the CKD-EPI Creatinine Equation (2021)    Anion gap 9 5 - 15    Comment: Performed at [facility], [address] [place], [place] [address]  Magnesium      Status: Abnormal   Collection Time: 06/09/24  6:06 AM  Result Value Ref Range   Magnesium  2.6 (H) 1.7 - 2.4 mg/dL    Comment: Performed at [facility], [address] [place], [place] [address]  Glucose, capillary     Status: Abnormal   Collection Time: 06/09/24  8:54  AM  Result Value Ref Range   Glucose-Capillary 212 (H) 70 - 99 mg/dL    Comment: Glucose reference range applies only to samples taken after fasting for at least 8 hours.  Glucose, capillary     Status: Abnormal   Collection Time: 06/09/24 12:38 PM  Result Value Ref Range   Glucose-Capillary 150 (H) 70 - 99 mg/dL    Comment: Glucose reference range applies only to samples taken after fasting for at least 8 hours.  Glucose, capillary     Status: Abnormal   Collection Time: 06/09/24  4:19 PM  Result Value Ref Range   Glucose-Capillary 196 (H) 70 - 99 mg/dL    Comment: Glucose reference range applies only to samples taken after fasting for at least 8 hours.  Glucose, capillary     Status: Abnormal   Collection Time: 06/09/24  9:14 PM  Result Value Ref Range   Glucose-Capillary 198 (H) 70 - 99 mg/dL    Comment: Glucose reference range applies only to samples taken after fasting for at least 8 hours.  Protime-INR     Status: Abnormal   Collection Time: 06/10/24  5:32 AM  Result Value Ref Range   Prothrombin Time 17.7 (H) 11.4 - 15.2 seconds   INR 1.4 (H) 0.8 - 1.2    Comment: (NOTE) INR goal varies based on device and disease states. Performed at [facility], [address] [place], [place] [address]   Basic metabolic panel with GFR     Status: Abnormal   Collection Time: 06/10/24  5:32 AM  Result Value Ref Range   Sodium 138 135 - 145 mmol/L   Potassium 4.1 3.5 - 5.1 mmol/L   Chloride 109 98 - 111 mmol/L   CO2 23 22 - 32 mmol/L   Glucose, Bld 171 (H) 70 - 99 mg/dL    Comment: Glucose reference range applies only to samples taken after fasting for at least 8 hours.   BUN 50 (H) 8 - 23 mg/dL   Creatinine, Ser 8.79 (H) 0.44 - 1.00 mg/dL   Calcium  8.9 8.9 - 10.3 mg/dL   GFR, Estimated 47 (L) >60 mL/min    Comment: (NOTE) Calculated using the CKD-EPI Creatinine Equation (2021)    Anion gap 6 5 - 15    Comment: Performed at [facility], [address]  [place], [place] [address]  Magnesium      Status: Abnormal   Collection Time: 06/10/24  5:32 AM  Result Value Ref Range   Magnesium  2.5 (H) 1.7 - 2.4 mg/dL    Comment: Performed at [facility], [address] [place], [place] [address]  Glucose, capillary     Status: Abnormal   Collection Time: 06/10/24  7:57 AM  Result Value Ref Range   Glucose-Capillary 177 (H) 70 - 99 mg/dL    Comment: Glucose reference range applies only to samples taken after fasting for at least 8 hours.  Glucose, capillary     Status: Abnormal   Collection Time: 06/10/24 11:04 AM  Result Value Ref Range   Glucose-Capillary 164 (H) 70 - 99 mg/dL    Comment: Glucose reference range applies only to samples taken after fasting for at least 8 hours.  Glucose, capillary     Status: Abnormal   Collection Time: 06/10/24  4:42 PM  Result Value Ref  Range   Glucose-Capillary 160 (H) 70 - 99 mg/dL    Comment: Glucose reference range applies only to samples taken after fasting for at least 8 hours.  Glucose, capillary     Status: Abnormal   Collection Time: 06/10/24  9:08 PM  Result Value Ref Range   Glucose-Capillary 153 (H) 70 - 99 mg/dL    Comment: Glucose reference range applies only to samples taken after fasting for at least 8 hours.  Protime-INR     Status: Abnormal   Collection Time: 06/11/24  6:36 AM  Result Value Ref Range   Prothrombin Time 18.0 (H) 11.4 - 15.2 seconds   INR 1.4 (H) 0.8 - 1.2    Comment: (NOTE) INR goal varies based on device and disease states. Performed at [facility], [address] [place], [place] [address]   Magnesium      Status: None   Collection Time: 06/11/24  6:36 AM  Result Value Ref Range   Magnesium  2.4 1.7 - 2.4 mg/dL    Comment: Performed at [facility], [address] [place], [place] [address]  Basic metabolic panel with GFR     Status: Abnormal   Collection Time: 06/11/24  6:36 AM  Result Value Ref Range   Sodium 141 135 - 145 mmol/L    Potassium 4.3 3.5 - 5.1 mmol/L   Chloride 112 (H) 98 - 111 mmol/L   CO2 21 (L) 22 - 32 mmol/L   Glucose, Bld 162 (H) 70 - 99 mg/dL    Comment: Glucose reference range applies only to samples taken after fasting for at least 8 hours.   BUN 46 (H) 8 - 23 mg/dL   Creatinine, Ser 9.01 0.44 - 1.00 mg/dL   Calcium  9.1 8.9 - 10.3 mg/dL   GFR, Estimated 60 (L) >60 mL/min    Comment: (NOTE) Calculated using the CKD-EPI Creatinine Equation (2021)    Anion gap 8 5 - 15    Comment: Performed at [facility], [address] [place], [place] [address]  Glucose, capillary     Status: Abnormal   Collection Time: 06/11/24  8:38 AM  Result Value Ref Range   Glucose-Capillary 156 (H) 70 - 99 mg/dL    Comment: Glucose reference range applies only to samples taken after fasting for at least 8 hours.  Glucose, capillary     Status: Abnormal   Collection Time: 06/11/24 12:41 PM  Result Value Ref Range   Glucose-Capillary 159 (H) 70 - 99 mg/dL    Comment: Glucose reference range applies only to samples taken after fasting for at least 8 hours.  Glucose, capillary     Status: Abnormal   Collection Time: 06/11/24  5:06 PM  Result Value Ref Range   Glucose-Capillary 162 (H) 70 - 99 mg/dL    Comment: Glucose reference range applies only to samples taken after fasting for at least 8 hours.  Glucose, capillary     Status: Abnormal   Collection Time: 06/11/24  9:33 PM  Result Value Ref Range   Glucose-Capillary 154 (H) 70 - 99 mg/dL    Comment: Glucose reference range applies only to samples taken after fasting for at least 8 hours.  Protime-INR     Status: Abnormal   Collection Time: 06/12/24  5:39 AM  Result Value Ref Range   Prothrombin Time 22.6 (H) 11.4 - 15.2 seconds   INR 1.9 (H) 0.8 - 1.2    Comment: (NOTE) INR goal varies based on device and  disease states. Performed at [facility], [address] [place], [place] [address]   CBC     Status: Abnormal   Collection Time:  06/12/24  5:39 AM  Result Value Ref Range   WBC 10.1 4.0 - 10.5 K/uL   RBC 4.39 3.87 - 5.11 MIL/uL   Hemoglobin 13.4 12.0 - 15.0 g/dL   HCT 58.4 63.9 - 53.9 %   MCV 94.5 80.0 - 100.0 fL   MCH 30.5 26.0 - 34.0 pg   MCHC 32.3 30.0 - 36.0 g/dL   RDW 83.9 (H) 88.4 - 84.4 %   Platelets 361 150 - 400 K/uL   nRBC 0.0 0.0 - 0.2 %    Comment: Performed at [facility], [address] [place], [place] [address]  Glucose, capillary     Status: Abnormal   Collection Time: 06/12/24  8:41 AM  Result Value Ref Range   Glucose-Capillary 207 (H) 70 - 99 mg/dL    Comment: Glucose reference range applies only to samples taken after fasting for at least 8 hours.  Glucose, capillary     Status: Abnormal   Collection Time: 06/12/24 12:09 PM  Result Value Ref Range   Glucose-Capillary 103 (H) 70 - 99 mg/dL    Comment: Glucose reference range applies only to samples taken after fasting for at least 8 hours.  Glucose, capillary     Status: Abnormal   Collection Time: 06/12/24  4:42 PM  Result Value Ref Range   Glucose-Capillary 150 (H) 70 - 99 mg/dL    Comment: Glucose reference range applies only to samples taken after fasting for at least 8 hours.  Glucose, capillary     Status: Abnormal   Collection Time: 06/12/24 10:38 PM  Result Value Ref Range   Glucose-Capillary 164 (H) 70 - 99 mg/dL    Comment: Glucose reference range applies only to samples taken after fasting for at least 8 hours.  Protime-INR     Status: Abnormal   Collection Time: 06/13/24  4:35 AM  Result Value Ref Range   Prothrombin Time 27.8 (H) 11.4 - 15.2 seconds   INR 2.5 (H) 0.8 - 1.2    Comment: (NOTE) INR goal varies based on device and disease states. Performed at [facility], [address] [place], [place] [address]   Basic metabolic panel with GFR     Status: Abnormal   Collection Time: 06/13/24  4:35 AM  Result Value Ref Range   Sodium 146 (H) 135 - 145 mmol/L   Potassium 4.3 3.5 - 5.1 mmol/L    Chloride 115 (H) 98 - 111 mmol/L   CO2 25 22 - 32 mmol/L   Glucose, Bld 154 (H) 70 - 99 mg/dL    Comment: Glucose reference range applies only to samples taken after fasting for at least 8 hours.   BUN 33 (H) 8 - 23 mg/dL   Creatinine, Ser 9.09 0.44 - 1.00 mg/dL   Calcium  9.4 8.9 - 10.3 mg/dL   GFR, Estimated >39 >39 mL/min    Comment: (NOTE) Calculated using the CKD-EPI Creatinine Equation (2021)    Anion gap 6 5 - 15    Comment: Performed at [facility], [address] [place], [place] [address]  Magnesium      Status: None   Collection Time: 06/13/24  4:35 AM  Result Value Ref Range   Magnesium  1.9 1.7 - 2.4 mg/dL    Comment: Performed at [facility], [address] [place],  [place] [address]  Glucose, capillary     Status: Abnormal   Collection Time: 06/13/24  7:30 AM  Result Value Ref Range   Glucose-Capillary 156 (H) 70 - 99 mg/dL    Comment: Glucose reference range applies only to samples taken after fasting for at least 8 hours.  Glucose, capillary     Status: Abnormal   Collection Time: 06/13/24 10:56 AM  Result Value Ref Range   Glucose-Capillary 136 (H) 70 - 99 mg/dL    Comment: Glucose reference range applies only to samples taken after fasting for at least 8 hours.  Glucose, capillary     Status: Abnormal   Collection Time: 06/13/24  3:10 PM  Result Value Ref Range   Glucose-Capillary 155 (H) 70 - 99 mg/dL    Comment: Glucose reference range applies only to samples taken after fasting for at least 8 hours.  Glucose, capillary     Status: Abnormal   Collection Time: 06/13/24 11:02 PM  Result Value Ref Range   Glucose-Capillary 143 (H) 70 - 99 mg/dL    Comment: Glucose reference range applies only to samples taken after fasting for at least 8 hours.  Protime-INR     Status: Abnormal   Collection Time: 06/14/24  3:55 AM  Result Value Ref Range   Prothrombin Time 30.2 (H) 11.4 - 15.2 seconds   INR 2.7 (H) 0.8 - 1.2    Comment: (NOTE) INR  goal varies based on device and disease states. Performed at [facility], [address] [place], [place] [address]   Basic metabolic panel with GFR     Status: Abnormal   Collection Time: 06/14/24  3:55 AM  Result Value Ref Range   Sodium 143 135 - 145 mmol/L   Potassium 4.3 3.5 - 5.1 mmol/L   Chloride 114 (H) 98 - 111 mmol/L   CO2 24 22 - 32 mmol/L   Glucose, Bld 154 (H) 70 - 99 mg/dL    Comment: Glucose reference range applies only to samples taken after fasting for at least 8 hours.   BUN 29 (H) 8 - 23 mg/dL   Creatinine, Ser 9.14 0.44 - 1.00 mg/dL   Calcium  9.3 8.9 - 10.3 mg/dL   GFR, Estimated >39 >39 mL/min    Comment: (NOTE) Calculated using the CKD-EPI Creatinine Equation (2021)    Anion gap 5 5 - 15    Comment: Performed at [facility], [address] [place], [place] [address]  Magnesium      Status: None   Collection Time: 06/14/24  3:55 AM  Result Value Ref Range   Magnesium  1.7 1.7 - 2.4 mg/dL    Comment: Performed at [facility], [address] [place], [place] [address]  Glucose, capillary     Status: Abnormal   Collection Time: 06/14/24  7:39 AM  Result Value Ref Range   Glucose-Capillary 158 (H) 70 - 99 mg/dL    Comment: Glucose reference range applies only to samples taken after fasting for at least 8 hours.  Glucose, capillary     Status: Abnormal   Collection Time: 06/14/24 11:52 AM  Result Value Ref Range   Glucose-Capillary 119 (H) 70 - 99 mg/dL    Comment: Glucose reference range applies only to samples taken after fasting for at least 8 hours.  Glucose, capillary     Status: Abnormal   Collection Time: 06/14/24  5:11 PM  Result Value Ref Range   Glucose-Capillary 128 (H) 70 - 99 mg/dL  Comment: Glucose reference range applies only to samples taken after fasting for at least 8 hours.  Glucose, capillary     Status: Abnormal   Collection Time: 06/14/24  8:31 PM  Result Value Ref Range   Glucose-Capillary 164 (H)  70 - 99 mg/dL    Comment: Glucose reference range applies only to samples taken after fasting for at least 8 hours.  Protime-INR     Status: Abnormal   Collection Time: 06/15/24  6:16 AM  Result Value Ref Range   Prothrombin Time 30.2 (H) 11.4 - 15.2 seconds   INR 2.7 (H) 0.8 - 1.2    Comment: (NOTE) INR goal varies based on device and disease states. Performed at [facility], [address] [place], [place] [address]   CBC with Differential/Platelet     Status: Abnormal   Collection Time: 06/15/24  6:16 AM  Result Value Ref Range   WBC 6.0 4.0 - 10.5 K/uL   RBC 4.11 3.87 - 5.11 MIL/uL   Hemoglobin 12.7 12.0 - 15.0 g/dL   HCT 60.6 63.9 - 53.9 %   MCV 95.6 80.0 - 100.0 fL   MCH 30.9 26.0 - 34.0 pg   MCHC 32.3 30.0 - 36.0 g/dL   RDW 83.5 (H) 88.4 - 84.4 %   Platelets 331 150 - 400 K/uL   nRBC 0.0 0.0 - 0.2 %   Neutrophils Relative % 55 %   Neutro Abs 3.3 1.7 - 7.7 K/uL   Lymphocytes Relative 23 %   Lymphs Abs 1.4 0.7 - 4.0 K/uL   Monocytes Relative 18 %   Monocytes Absolute 1.1 (H) 0.1 - 1.0 K/uL   Eosinophils Relative 2 %   Eosinophils Absolute 0.1 0.0 - 0.5 K/uL   Basophils Relative 1 %   Basophils Absolute 0.0 0.0 - 0.1 K/uL   Immature Granulocytes 1 %   Abs Immature Granulocytes 0.07 0.00 - 0.07 K/uL    Comment: Performed at [facility], [address] [place], [place] [address]  Basic metabolic panel     Status: Abnormal   Collection Time: 06/15/24  6:16 AM  Result Value Ref Range   Sodium 142 135 - 145 mmol/L   Potassium 4.1 3.5 - 5.1 mmol/L   Chloride 113 (H) 98 - 111 mmol/L   CO2 22 22 - 32 mmol/L   Glucose, Bld 152 (H) 70 - 99 mg/dL    Comment: Glucose reference range applies only to samples taken after fasting for at least 8 hours.   BUN 31 (H) 8 - 23 mg/dL   Creatinine, Ser 9.14 0.44 - 1.00 mg/dL   Calcium  9.1 8.9 - 10.3 mg/dL   GFR, Estimated >39 >39 mL/min    Comment: (NOTE) Calculated using the CKD-EPI Creatinine Equation  (2021)    Anion gap 7 5 - 15    Comment: Performed at [facility], [address] [place], [place] [address]  Glucose, capillary     Status: Abnormal   Collection Time: 06/15/24  8:16 AM  Result Value Ref Range   Glucose-Capillary 137 (H) 70 - 99 mg/dL    Comment: Glucose reference range applies only to samples taken after fasting for at least 8 hours.  Glucose, capillary     Status: Abnormal   Collection Time: 06/15/24 12:08 PM  Result Value Ref Range   Glucose-Capillary 118 (H) 70 - 99 mg/dL    Comment: Glucose reference range applies only to samples taken after fasting for at least 8  hours.  Glucose, capillary     Status: Abnormal   Collection Time: 06/15/24  4:23 PM  Result Value Ref Range   Glucose-Capillary 139 (H) 70 - 99 mg/dL    Comment: Glucose reference range applies only to samples taken after fasting for at least 8 hours.  Glucose, capillary     Status: Abnormal   Collection Time: 06/15/24  8:27 PM  Result Value Ref Range   Glucose-Capillary 158 (H) 70 - 99 mg/dL    Comment: Glucose reference range applies only to samples taken after fasting for at least 8 hours.  Protime-INR     Status: Abnormal   Collection Time: 06/16/24  7:16 AM  Result Value Ref Range   Prothrombin Time 29.5 (H) 11.4 - 15.2 seconds   INR 2.7 (H) 0.8 - 1.2    Comment: (NOTE) INR goal varies based on device and disease states. Performed at [facility], [address] [place], [place] [address]   CBC with Differential/Platelet     Status: Abnormal   Collection Time: 06/16/24  7:16 AM  Result Value Ref Range   WBC 6.3 4.0 - 10.5 K/uL   RBC 4.43 3.87 - 5.11 MIL/uL   Hemoglobin 13.4 12.0 - 15.0 g/dL   HCT 56.8 63.9 - 53.9 %   MCV 97.3 80.0 - 100.0 fL   MCH 30.2 26.0 - 34.0 pg   MCHC 31.1 30.0 - 36.0 g/dL   RDW 83.3 (H) 88.4 - 84.4 %   Platelets 358 150 - 400 K/uL   nRBC 0.0 0.0 - 0.2 %   Neutrophils Relative % 57 %   Neutro Abs 3.6 1.7 - 7.7 K/uL   Lymphocytes  Relative 23 %   Lymphs Abs 1.5 0.7 - 4.0 K/uL   Monocytes Relative 16 %   Monocytes Absolute 1.0 0.1 - 1.0 K/uL   Eosinophils Relative 2 %   Eosinophils Absolute 0.1 0.0 - 0.5 K/uL   Basophils Relative 1 %   Basophils Absolute 0.1 0.0 - 0.1 K/uL   Immature Granulocytes 1 %   Abs Immature Granulocytes 0.07 0.00 - 0.07 K/uL    Comment: Performed at [facility], [address], [place], [place] [address]  Basic metabolic panel     Status: Abnormal   Collection Time: 06/16/24  7:16 AM  Result Value Ref Range   Sodium 142 135 - 145 mmol/L   Potassium 3.9 3.5 - 5.1 mmol/L   Chloride 110 98 - 111 mmol/L   CO2 23 22 - 32 mmol/L   Glucose, Bld 145 (H) 70 - 99 mg/dL    Comment: Glucose reference range applies only to samples taken after fasting for at least 8 hours.   BUN 30 (H) 8 - 23 mg/dL   Creatinine, Ser 9.03 0.44 - 1.00 mg/dL   Calcium  9.3 8.9 - 10.3 mg/dL   GFR, Estimated >39 >39 mL/min    Comment: (NOTE) Calculated using the CKD-EPI Creatinine Equation (2021)    Anion gap 9 5 - 15    Comment: Performed at [facility], [address] [place], [place] [address]  Glucose, capillary     Status: Abnormal   Collection Time: 06/16/24  9:03 AM  Result Value Ref Range   Glucose-Capillary 196 (H) 70 - 99 mg/dL    Comment: Glucose reference range applies only to samples taken after fasting for at least 8 hours.  Glucose, capillary     Status: Abnormal   Collection Time: 06/16/24 11:01 AM  Result Value Ref Range   Glucose-Capillary 144 (H) 70 - 99 mg/dL    Comment: Glucose reference range applies only to samples taken after fasting for at least 8 hours.  POCT INR     Status: Abnormal   Collection Time: 06/26/24 10:56 AM  Result Value Ref Range   INR 1.6 (A) 2.0 - 3.0   POC INR    CULTURE, URINE COMPREHENSIVE     Status: None   Collection Time: 06/26/24 12:28 PM   Specimen: Urine  Result Value Ref Range   MICRO NUMBER: [id number]    SPECIMEN QUALITY: Adequate     Source OTHER (SPECIFY)    STATUS: FINAL    RESULT:      Mixed genital flora isolated. These superficial bacteria are not indicative of a urinary tract infection. No further organism identification is warranted on this specimen. If clinically indicated, recollect clean-catch, mid-stream urine and transfer  immediately to Urine Culture Transport Tube.   POCT Urinalysis Dipstick     Status: Abnormal   Collection Time: 07/27/24  2:36 PM  Result Value Ref Range   Color, UA yellow    Clarity, UA cloudy    Glucose, UA Positive (A) Negative   Bilirubin, UA negative    Ketones, UA negative    Spec Grav, UA 1.020 1.010 - 1.025   Blood, UA negative    pH, UA 5.0 5.0 - 8.0   Protein, UA Negative Negative   Urobilinogen, UA 0.2 0.2 or 1.0 E.U./dL   Nitrite, UA negtaive    Leukocytes, UA Negative Negative   Appearance cloudy    Odor none   Basic Metabolic Panel (BMET)     Status: Abnormal   Collection Time: 07/27/24  2:37 PM  Result Value Ref Range   Glucose, Bld 176 (H) 65 - 99 mg/dL    Comment: .            Fasting reference interval . For someone without known diabetes, a glucose value >125 mg/dL indicates that they may have diabetes and this should be confirmed with a follow-up test. .    BUN 27 (H) 7 - 25 mg/dL   Creat 8.67 (H) 9.39 - 1.00 mg/dL   eGFR 42 (L) > OR = 60 mL/min/1.92m2   BUN/Creatinine Ratio 20 6 - 22 (calc)   Sodium 143 135 - 146 mmol/L   Potassium 4.0 3.5 - 5.3 mmol/L   Chloride 101 98 - 110 mmol/L   CO2 30 20 - 32 mmol/L   Calcium  9.4 8.6 - 10.4 mg/dL  B Nat Peptide     Status: Abnormal   Collection Time: 07/27/24  2:37 PM  Result Value Ref Range   Brain Natriuretic Peptide 231 (H) <100 pg/mL    Comment: . BNP levels increase with age in the general population with the highest values seen in individuals greater than 51 years of age. Reference: J. Am. Penne. Cardiol. 2002; 59:023-017. .     Diabetic Foot Exam:  Diabetic foot exam was performed with  the following findings:   Normal sensation of 10g monofilament Intact posterior tibialis and dorsalis pedis pulses Thick toenails.       PHQ2/9:    06/26/2024    9:59 AM 05/25/2024    3:30 PM 05/05/2024    1:18 PM 01/05/2024    1:14 PM 09/07/2023    1:12 PM  Depression screen PHQ 2/9  Decreased Interest 0 0 0 0 0  Down, Depressed, Hopeless 0 0 0 0  0  PHQ - 2 Score 0 0 0 0 0  Altered sleeping 0 0  0 0  Tired, decreased energy 0 0  0 0  Change in appetite 0 0  0 0  Feeling bad or failure about yourself  0 0  0 0  Trouble concentrating 0 0  0 0  Moving slowly or fidgety/restless 0 0  0 0  Suicidal thoughts 0 0  0 0  PHQ-9 Score 0 0  0 0  Difficult doing work/chores Not difficult at all Not difficult at all  Not difficult at all     phq 9 is negative  Fall Risk:    06/26/2024    9:59 AM 05/21/2024   10:52 AM 05/05/2024    1:18 PM 01/05/2024    1:14 PM 09/07/2023    1:11 PM  Fall Risk   Falls in the past year? 0 0 0 0 0  Number falls in past yr: 0 0 0 0 0  Injury with Fall? 0 0 0 0 0  Risk for fall due to : No Fall Risks No Fall Risks No Fall Risks No Fall Risks Impaired balance/gait  Follow up Falls evaluation completed Falls evaluation completed;Falls prevention discussed Falls evaluation completed Falls prevention discussed;Education provided;Falls evaluation completed Falls prevention discussed     Assessment & Plan Lower extremity edema Chronic edema with history of cellulitis and recent sepsis. Current edema due to tight compression stockings. No infection or heart failure. Elevated BNP not indicative of heart failure. Kidney function normalized post-Lasix . - Confirm availability of diabetic socks at [facility] pharmacy. - Advise purchase of diabetic socks to reduce edema without discomfort. - Continue current Lasix  regimen.  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 - Perform foot exam for diabetes management.  Obstructive  sleep apnea Managed with CPAP. She seeks alternatives due to mask discomfort. - Refer to Dr. Jess at [facility] for evaluation of alternative sleep apnea treatments.  General Health Maintenance Flu vaccination due. Other vaccinations up to date. COVID-19 vaccine not received since last year. Recent mammogram completed. - Administer flu shot today. - Advise to receive updated COVID-19 vaccine at local pharmacy.

## 2024-08-19 DIAGNOSIS — E119 Type 2 diabetes mellitus without complications: Secondary | ICD-10-CM | POA: Diagnosis not present

## 2024-08-22 ENCOUNTER — Other Ambulatory Visit: Payer: Self-pay | Admitting: Family Medicine

## 2024-08-22 DIAGNOSIS — L409 Psoriasis, unspecified: Secondary | ICD-10-CM

## 2024-08-23 ENCOUNTER — Encounter: Payer: Self-pay | Admitting: Family Medicine

## 2024-08-29 DIAGNOSIS — M7062 Trochanteric bursitis, left hip: Secondary | ICD-10-CM | POA: Diagnosis not present

## 2024-09-05 ENCOUNTER — Ambulatory Visit: Admitting: Family Medicine

## 2024-09-05 ENCOUNTER — Ambulatory Visit

## 2024-09-05 ENCOUNTER — Encounter: Payer: Self-pay | Admitting: Family Medicine

## 2024-09-05 VITALS — BP 108/72 | HR 60 | Resp 18 | Ht 61.0 in | Wt 241.6 lb

## 2024-09-05 DIAGNOSIS — E538 Deficiency of other specified B group vitamins: Secondary | ICD-10-CM

## 2024-09-05 DIAGNOSIS — Z7985 Long-term (current) use of injectable non-insulin antidiabetic drugs: Secondary | ICD-10-CM | POA: Diagnosis not present

## 2024-09-05 DIAGNOSIS — E785 Hyperlipidemia, unspecified: Secondary | ICD-10-CM | POA: Diagnosis not present

## 2024-09-05 DIAGNOSIS — M109 Gout, unspecified: Secondary | ICD-10-CM | POA: Diagnosis not present

## 2024-09-05 DIAGNOSIS — E1122 Type 2 diabetes mellitus with diabetic chronic kidney disease: Secondary | ICD-10-CM | POA: Diagnosis not present

## 2024-09-05 DIAGNOSIS — E1169 Type 2 diabetes mellitus with other specified complication: Secondary | ICD-10-CM

## 2024-09-05 DIAGNOSIS — E669 Obesity, unspecified: Secondary | ICD-10-CM | POA: Diagnosis not present

## 2024-09-05 DIAGNOSIS — G894 Chronic pain syndrome: Secondary | ICD-10-CM

## 2024-09-05 DIAGNOSIS — N1831 Chronic kidney disease, stage 3a: Secondary | ICD-10-CM

## 2024-09-05 DIAGNOSIS — N183 Chronic kidney disease, stage 3 unspecified: Secondary | ICD-10-CM | POA: Diagnosis not present

## 2024-09-05 DIAGNOSIS — R2681 Unsteadiness on feet: Secondary | ICD-10-CM

## 2024-09-05 DIAGNOSIS — G4733 Obstructive sleep apnea (adult) (pediatric): Secondary | ICD-10-CM | POA: Diagnosis not present

## 2024-09-05 DIAGNOSIS — I4891 Unspecified atrial fibrillation: Secondary | ICD-10-CM

## 2024-09-05 DIAGNOSIS — Z6841 Body Mass Index (BMI) 40.0 and over, adult: Secondary | ICD-10-CM

## 2024-09-05 DIAGNOSIS — I10 Essential (primary) hypertension: Secondary | ICD-10-CM | POA: Diagnosis not present

## 2024-09-05 DIAGNOSIS — E119 Type 2 diabetes mellitus without complications: Secondary | ICD-10-CM

## 2024-09-05 DIAGNOSIS — N2581 Secondary hyperparathyroidism of renal origin: Secondary | ICD-10-CM

## 2024-09-05 LAB — POCT GLYCOSYLATED HEMOGLOBIN (HGB A1C): Hemoglobin A1C: 6.1 % — AB (ref 4.0–5.6)

## 2024-09-05 NOTE — Progress Notes (Signed)
 Name: Amber Avery   MRN: [id number]    DOB: Oct 11, 1946   Date:09/05/2024       Progress Note  Subjective  Chief Complaint  Chief Complaint  Patient presents with   Medical Management of Chronic Issues   Discussed the use of AI scribe software for clinical note transcription with the patient, who gave verbal consent to proceed.  History of Present Illness Amber Avery is a 78 year old female who presents for a follow-up visit.  She has experienced a significant improvement in her type 2 diabetes management, with her A1c decreasing from 7.3% in July to 6.1% currently. She attributes this improvement to dietary changes, although she occasionally indulges in sweets like orange slices. Her current medications include Trulicity  4.5 mg, metformin  750 mg daily, and Farxiga  10 mg daily. No hypoglycemic episodes have occurred.  She has chronic kidney disease stage 3A, with a GFR of 43 noted during a hospital stay for sepsis secondary to a UTI. Her kidney function declined significantly during hospitalization but improved slightly before discharge. She has not had a comprehensive panel since her hospital discharge.  She has a history of atrial fibrillation, which she does not frequently feel. Her current medications include Eliquis  500 mg daily and metoprolol  50 mg twice daily. She experiences occasional dizziness, particularly when turning quickly, but not when standing up.  She has a history of obesity, with a current weight of 241.6 pounds, down from 249 pounds last visit . She reports a past weight of over 300 pounds and is pleased with her weight loss, aiming to lose more.  She has hypertension, with a current blood pressure of 108/72. Her medications include diltiazem  240 mg daily and Lasix  for swelling, which is well-controlled. She uses compression stockings to manage leg swelling and reports improvement in leg pain at night.  She has gout, which is currently controlled with allopurinol   100 mg twice daily.  She experiences chronic pain and takes gabapentin  for pain management. Gabapentin  helps with pain but does not fully control it.  She has a history of B12 deficiency and is currently taking B12 tablets. She is unsure if they are sublingual.    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   Greater trochanteric bursitis, left 06/18/2022   Gait instability 12/13/2020   Stage 3a chronic kidney disease (HCC) 12/13/2020   Morbid obesity with BMI of 45.0-49.9, adult (HCC) 02/06/2020   History of gastritis 08/14/2019   Angiodysplasia of stomach and duodenum    Iron  deficiency anemia 04/20/2018   Lichen sclerosus of female genitalia 06/29/2016   Primary osteoarthritis of both knees 04/07/2016   Right thyroid  nodule 06/26/2015   Asthma, well controlled 04/03/2015   Benign essential HTN 04/03/2015   Carpal tunnel syndrome 04/03/2015   Chronic kidney disease (CKD), stage II (mild) 04/03/2015   Controlled gout 04/03/2015   Arteriosclerosis of coronary artery 04/03/2015   Bilateral edema of lower extremity 04/03/2015   Knee pain 04/03/2015   Lumbar radiculitis 04/03/2015   OSA on CPAP 04/03/2015   Lumbosacral spondylosis 04/03/2015   Osteoarthritis, chronic 04/03/2015   Psoriasis 04/03/2015   Vitamin D  deficiency 04/03/2015   Varicose veins 04/03/2015  Goiter 12/23/2014   Degeneration of intervertebral disc of lumbar region 08/23/2014    Past Surgical History:  Procedure Laterality Date   APPENDECTOMY     CATARACT EXTRACTION W/PHACO Left 08/14/2020   Procedure: CATARACT EXTRACTION PHACO AND INTRAOCULAR LENS  PLACEMENT (IOC) LEFT DIABETIC 3.56 00:59.6 6.0%;  Surgeon: Mittie Gaskin, MD;  Location: [facility];  Service: Ophthalmology;  Laterality: Left;  Diabetic   CATARACT EXTRACTION W/PHACO Right 09/04/2020   Procedure: CATARACT EXTRACTION PHACO AND INTRAOCULAR LENS PLACEMENT (IOC) RIGHT DIABETIC;  Surgeon: Mittie Gaskin, MD;  Location: [facility];  Service: Ophthalmology;  Laterality: Right;  4.72 1:03.2 7.4%   CHOLECYSTECTOMY  10/20/1975   COLONOSCOPY WITH PROPOFOL  N/A 01/21/2016   Procedure: COLONOSCOPY WITH PROPOFOL ;  Surgeon: Rogelia Copping, MD;  Location: [facility];  Service: Endoscopy;  Laterality: N/A;   DILATION AND CURETTAGE OF UTERUS     Due to Amenorrhea   ESOPHAGOGASTRODUODENOSCOPY (EGD) WITH PROPOFOL  N/A 08/16/2018   Procedure: ESOPHAGOGASTRODUODENOSCOPY (EGD) WITH PROPOFOL ;  Surgeon: Copping Rogelia, MD;  Location: [facility];  Service: Endoscopy;  Laterality: N/A;   EYE SURGERY     HEMORRHOID BANDING  10/19/1978   HEMORROIDECTOMY     HERNIA REPAIR  10/19/2009   Temporal Area Excision Biopsy     For Birth Mark Changes, Negative Pathology   TONSILLECTOMY AND ADENOIDECTOMY      Family History  Problem Relation Age of Onset   Cancer Mother        brain tumor   Kidney disease Mother    Hypertension Mother    Heart disease Father    COPD Father    Hearing loss Father    Hypertension Sister    Arthritis Sister    Cancer Sister    Heart murmur Sister    GER disease Sister    Arthritis Sister    Lupus Sister    Diabetes Sister    Arthritis Sister    Osteopenia Sister    Heart disease Sister    Hypertension Sister    Hearing loss Sister    Obesity Sister    Breast cancer Maternal Aunt 66   Leukemia Maternal Aunt     Social History   Tobacco Use   Smoking status: Former    Current packs/day: 0.00    Average packs/day: 2.0 packs/day for 15.0 years (30.0 ttl pk-yrs)    Types: Cigarettes    Start date: 80    Quit date: 2     Years since quitting: 42.9   Smokeless tobacco: Never   Tobacco comments:    smoking cessation materials not required  Substance Use Topics   Alcohol  use: Not Currently    Alcohol /week: 0.0 standard drinks of alcohol      Current Outpatient Medications:    acetaminophen  (TYLENOL ) 500 MG tablet, Take 1 tablet (500 mg total) by mouth every 6 (six) hours as needed., Disp: 480 tablet, Rfl: 0   albuterol  (VENTOLIN  HFA) 108 (90 Base) MCG/ACT inhaler, Inhale 2 puffs into the lungs every 6 (six) hours as needed for wheezing or shortness of breath., Disp: 8 g, Rfl: 5   allopurinol  (ZYLOPRIM ) 100 MG tablet, Take 1 tablet (100 mg total) by mouth 2 (two) times daily., Disp: 180 tablet, Rfl: 3   apixaban  (ELIQUIS ) 5 MG TABS tablet, Take 1 tablet (5 mg total) by mouth every 12 (twelve) hours, Disp: , Rfl:    atorvastatin  (LIPITOR) 20 MG tablet, TAKE 1 TABLET BY MOUTH DAILY FOR CHOLESTEROL, Disp: 90 tablet, Rfl: 3  Blood Glucose Monitoring Suppl (CONTOUR NEXT EZ) w/Device KIT, , Disp: , Rfl:    Blood Glucose Monitoring Suppl DEVI, 1 each by Does not apply route in the morning, at noon, and at bedtime., Disp: 1 each, Rfl: 0   Blood Pressure KIT, 1 each by Does not apply route daily., Disp: 1 kit, Rfl: 0   clobetasol  ointment (TEMOVATE ) 0.05 %, Apply 1 Application topically at bedtime. Apply to the affected area nightly for 4 weeks., Disp: , Rfl:    CONTOUR NEXT TEST test strip, daily., Disp: , Rfl:    dapagliflozin  propanediol (FARXIGA ) 10 MG TABS tablet, Take 1 tablet (10 mg total) by mouth daily before breakfast., Disp: 90 tablet, Rfl: 3   diltiazem  (CARDIZEM  CD) 240 MG 24 hr capsule, Take 1 capsule (240 mg total) by mouth daily., Disp: 7 capsule, Rfl: 0   Dulaglutide  (TRULICITY ) 4.5 MG/0.5ML SOAJ, Inject 4.5 mg as directed once a week., Disp: 8 mL, Rfl: 2   furosemide  (LASIX ) 20 MG tablet, Take 1 tablet (20 mg total) by mouth 2 (two) times daily. Second dose around noon, Disp: 180 tablet, Rfl: 1    gabapentin  (NEURONTIN ) 300 MG capsule, TAKE 1 CAPSULE BY MOUTH IN THE MORNING, 1 CAPSULE IN THE EVENING AND 2 CAPSULES AT NIGHT, Disp: 360 capsule, Rfl: 3   Glucose Blood (BLOOD GLUCOSE TEST STRIPS) STRP, 1 each by In Vitro route daily., Disp: 90 each, Rfl: 2   Lancet Device MISC, 1 each by Does not apply route as directed. May substitute to any manufacturer covered by patient's insurance., Disp: 1 each, Rfl: 0   Lancets (ONETOUCH ULTRASOFT) lancets, Use as instructed, Disp: 100 each, Rfl: 12   Lancets Misc. MISC, 1 each by Does not apply route daily. May substitute to any manufacturer covered by patient's insurance., Disp: 100 each, Rfl: 2   metFORMIN  (GLUCOPHAGE -XR) 750 MG 24 hr tablet, Take 1 tablet (750 mg total) by mouth daily with breakfast., Disp: 90 tablet, Rfl: 1   methocarbamol  (ROBAXIN ) 500 MG tablet, Take 500 mg by mouth 3 (three) times daily., Disp: , Rfl:    metoprolol  tartrate (LOPRESSOR ) 50 MG tablet, Take 1 tablet (50 mg total) by mouth 2 (two) times daily., Disp: , Rfl:    polyethylene glycol (MIRALAX  / GLYCOLAX ) 17 g packet, Take 17 g by mouth daily as needed for mild constipation., Disp: , Rfl:    potassium chloride  SA (KLOR-CON  M) 20 MEQ tablet, TAKE 1 TABLET BY MOUTH DAILY, Disp: 90 tablet, Rfl: 1   traMADol  (ULTRAM ) 50 MG tablet, Take 0.5-1 tablets (25-50 mg total) by mouth 2 (two) times daily as needed., Disp: 8 tablet, Rfl: 0   triamcinolone  ointment (KENALOG ) 0.1 %, APPLY TOPICALLY TWICE DAILY AS NEEDED. GENERIC EQUIVALENT FOR KENALOG , Disp: 90 g, Rfl: 0   vitamin B-12 (CYANOCOBALAMIN ) 500 MCG tablet, Take 500 mcg by mouth daily., Disp: , Rfl:    Vitamin D , Cholecalciferol , 25 MCG (1000 UT) TABS, Take 1 capsule by mouth daily., Disp: 90 tablet, Rfl: 1   VOLTAREN  1 % GEL, Apply 4 g topically 4 (four) times daily., Disp: 300 g, Rfl: 3  Allergies  Allergen Reactions   Codeine Hives and Nausea And Vomiting    I personally reviewed active problem list, medication list,  allergies, family history with the patient/caregiver today.   ROS  Ten systems reviewed and is negative except as mentioned in HPI    Objective Physical Exam  CONSTITUTIONAL: Patient appears well-developed and well-nourished. No distress. HEENT: Head  atraumatic, normocephalic, neck supple. CARDIOVASCULAR: Normal rate, regular rhythm and normal heart sounds. No murmur heard. No BLE edema. PULMONARY: Effort normal and breath sounds normal. No respiratory distress. ABDOMINAL: There is no tenderness or distention. MUSCULOSKELETAL: slow gait, uses walker PSYCHIATRIC: Patient has a normal mood and affect. Behavior is normal. Judgment and thought content normal. NEUROLOGICAL: Awake and alert. SKIN: Skin lesion healing on the nose.  Vitals:   09/05/24 1304  BP: 108/72  Pulse: 60  Resp: 18  SpO2: 93%  Weight: 241 lb 9.6 oz (109.6 kg)  Height: 5' 1 (1.549 m)    Body mass index is 45.65 kg/m.  Recent Results (from the past 2160 hours)  CBG monitoring, ED     Status: Abnormal   Collection Time: 06/07/24  5:31 PM  Result Value Ref Range   Glucose-Capillary 196 (H) 70 - 99 mg/dL    Comment: Glucose reference range applies only to samples taken after fasting for at least 8 hours.  Glucose, capillary     Status: Abnormal   Collection Time: 06/07/24 10:09 PM  Result Value Ref Range   Glucose-Capillary 158 (H) 70 - 99 mg/dL    Comment: Glucose reference range applies only to samples taken after fasting for at least 8 hours.  Basic metabolic panel     Status: Abnormal   Collection Time: 06/08/24  5:41 AM  Result Value Ref Range   Sodium 132 (L) 135 - 145 mmol/L   Potassium 3.9 3.5 - 5.1 mmol/L   Chloride 101 98 - 111 mmol/L   CO2 22 22 - 32 mmol/L   Glucose, Bld 177 (H) 70 - 99 mg/dL    Comment: Glucose reference range applies only to samples taken after fasting for at least 8 hours.   BUN 43 (H) 8 - 23 mg/dL   Creatinine, Ser 8.48 (H) 0.44 - 1.00 mg/dL   Calcium  8.3 (L) 8.9 -  10.3 mg/dL   GFR, Estimated 36 (L) >60 mL/min    Comment: (NOTE) Calculated using the CKD-EPI Creatinine Equation (2021)    Anion gap 9 5 - 15    Comment: Performed at [facility], [address] [place], [place] [address]  CBC     Status: Abnormal   Collection Time: 06/08/24  5:41 AM  Result Value Ref Range   WBC 12.6 (H) 4.0 - 10.5 K/uL   RBC 4.04 3.87 - 5.11 MIL/uL   Hemoglobin 12.6 12.0 - 15.0 g/dL   HCT 62.1 63.9 - 53.9 %   MCV 93.6 80.0 - 100.0 fL   MCH 31.2 26.0 - 34.0 pg   MCHC 33.3 30.0 - 36.0 g/dL   RDW 84.2 (H) 88.4 - 84.4 %   Platelets 151 150 - 400 K/uL   nRBC 0.0 0.0 - 0.2 %    Comment: Performed at [facility], [address] [place], [place] [address]  Magnesium      Status: Abnormal   Collection Time: 06/08/24  5:41 AM  Result Value Ref Range   Magnesium  2.7 (H) 1.7 - 2.4 mg/dL    Comment: Performed at [facility], [address], [place], [place] [address]  Brain natriuretic peptide     Status: Abnormal   Collection Time: 06/08/24  5:41 AM  Result Value Ref Range   B Natriuretic Peptide 158.8 (H) 0.0 - 100.0 pg/mL    Comment: Performed at [facility], [address] [place], [place] [address]  Glucose, capillary     Status: Abnormal  Collection Time: 06/08/24  7:30 AM  Result Value Ref Range   Glucose-Capillary 142 (H) 70 - 99 mg/dL    Comment: Glucose reference range applies only to samples taken after fasting for at least 8 hours.  Glucose, capillary     Status: Abnormal   Collection Time: 06/08/24 12:08 PM  Result Value Ref Range   Glucose-Capillary 202 (H) 70 - 99 mg/dL    Comment: Glucose reference range applies only to samples taken after fasting for at least 8 hours.  Glucose, capillary     Status: Abnormal   Collection Time: 06/08/24  5:19 PM  Result Value Ref Range   Glucose-Capillary 171 (H) 70 - 99 mg/dL    Comment: Glucose reference range applies only to samples taken after fasting for at least 8  hours.  Glucose, capillary     Status: Abnormal   Collection Time: 06/08/24  8:44 PM  Result Value Ref Range   Glucose-Capillary 186 (H) 70 - 99 mg/dL    Comment: Glucose reference range applies only to samples taken after fasting for at least 8 hours.  Basic metabolic panel with GFR     Status: Abnormal   Collection Time: 06/09/24  6:06 AM  Result Value Ref Range   Sodium 135 135 - 145 mmol/L   Potassium 3.8 3.5 - 5.1 mmol/L   Chloride 104 98 - 111 mmol/L   CO2 22 22 - 32 mmol/L   Glucose, Bld 162 (H) 70 - 99 mg/dL    Comment: Glucose reference range applies only to samples taken after fasting for at least 8 hours.   BUN 55 (H) 8 - 23 mg/dL   Creatinine, Ser 8.48 (H) 0.44 - 1.00 mg/dL   Calcium  8.5 (L) 8.9 - 10.3 mg/dL   GFR, Estimated 36 (L) >60 mL/min    Comment: (NOTE) Calculated using the CKD-EPI Creatinine Equation (2021)    Anion gap 9 5 - 15    Comment: Performed at [facility], [address] [place], [place] [address]  Magnesium      Status: Abnormal   Collection Time: 06/09/24  6:06 AM  Result Value Ref Range   Magnesium  2.6 (H) 1.7 - 2.4 mg/dL    Comment: Performed at [facility], [address] [place], [place] [address]  Glucose, capillary     Status: Abnormal   Collection Time: 06/09/24  8:54 AM  Result Value Ref Range   Glucose-Capillary 212 (H) 70 - 99 mg/dL    Comment: Glucose reference range applies only to samples taken after fasting for at least 8 hours.  Glucose, capillary     Status: Abnormal   Collection Time: 06/09/24 12:38 PM  Result Value Ref Range   Glucose-Capillary 150 (H) 70 - 99 mg/dL    Comment: Glucose reference range applies only to samples taken after fasting for at least 8 hours.  Glucose, capillary     Status: Abnormal   Collection Time: 06/09/24  4:19 PM  Result Value Ref Range   Glucose-Capillary 196 (H) 70 - 99 mg/dL    Comment: Glucose reference range applies only to samples taken after fasting for at least 8  hours.  Glucose, capillary     Status: Abnormal   Collection Time: 06/09/24  9:14 PM  Result Value Ref Range   Glucose-Capillary 198 (H) 70 - 99 mg/dL    Comment: Glucose reference range applies only to samples taken after fasting for at least 8 hours.  Protime-INR  Status: Abnormal   Collection Time: 06/10/24  5:32 AM  Result Value Ref Range   Prothrombin Time 17.7 (H) 11.4 - 15.2 seconds   INR 1.4 (H) 0.8 - 1.2    Comment: (NOTE) INR goal varies based on device and disease states. Performed at [facility], [address] [place], [place] [address]   Basic metabolic panel with GFR     Status: Abnormal   Collection Time: 06/10/24  5:32 AM  Result Value Ref Range   Sodium 138 135 - 145 mmol/L   Potassium 4.1 3.5 - 5.1 mmol/L   Chloride 109 98 - 111 mmol/L   CO2 23 22 - 32 mmol/L   Glucose, Bld 171 (H) 70 - 99 mg/dL    Comment: Glucose reference range applies only to samples taken after fasting for at least 8 hours.   BUN 50 (H) 8 - 23 mg/dL   Creatinine, Ser 8.79 (H) 0.44 - 1.00 mg/dL   Calcium  8.9 8.9 - 10.3 mg/dL   GFR, Estimated 47 (L) >60 mL/min    Comment: (NOTE) Calculated using the CKD-EPI Creatinine Equation (2021)    Anion gap 6 5 - 15    Comment: Performed at [facility], [address] [place], [place] [address]  Magnesium      Status: Abnormal   Collection Time: 06/10/24  5:32 AM  Result Value Ref Range   Magnesium  2.5 (H) 1.7 - 2.4 mg/dL    Comment: Performed at [facility], [address] [place], [place] [address]  Glucose, capillary     Status: Abnormal   Collection Time: 06/10/24  7:57 AM  Result Value Ref Range   Glucose-Capillary 177 (H) 70 - 99 mg/dL    Comment: Glucose reference range applies only to samples taken after fasting for at least 8 hours.  Glucose, capillary     Status: Abnormal   Collection Time: 06/10/24 11:04 AM  Result Value Ref Range   Glucose-Capillary 164 (H) 70 - 99 mg/dL    Comment: Glucose  reference range applies only to samples taken after fasting for at least 8 hours.  Glucose, capillary     Status: Abnormal   Collection Time: 06/10/24  4:42 PM  Result Value Ref Range   Glucose-Capillary 160 (H) 70 - 99 mg/dL    Comment: Glucose reference range applies only to samples taken after fasting for at least 8 hours.  Glucose, capillary     Status: Abnormal   Collection Time: 06/10/24  9:08 PM  Result Value Ref Range   Glucose-Capillary 153 (H) 70 - 99 mg/dL    Comment: Glucose reference range applies only to samples taken after fasting for at least 8 hours.  Protime-INR     Status: Abnormal   Collection Time: 06/11/24  6:36 AM  Result Value Ref Range   Prothrombin Time 18.0 (H) 11.4 - 15.2 seconds   INR 1.4 (H) 0.8 - 1.2    Comment: (NOTE) INR goal varies based on device and disease states. Performed at [facility], [address] [place], [place] [address]   Magnesium      Status: None   Collection Time: 06/11/24  6:36 AM  Result Value Ref Range   Magnesium  2.4 1.7 - 2.4 mg/dL    Comment: Performed at [facility], [address], [place], [place] [address]  Basic metabolic panel with GFR     Status: Abnormal   Collection Time: 06/11/24  6:36 AM  Result Value Ref Range  Sodium 141 135 - 145 mmol/L   Potassium 4.3 3.5 - 5.1 mmol/L   Chloride 112 (H) 98 - 111 mmol/L   CO2 21 (L) 22 - 32 mmol/L   Glucose, Bld 162 (H) 70 - 99 mg/dL    Comment: Glucose reference range applies only to samples taken after fasting for at least 8 hours.   BUN 46 (H) 8 - 23 mg/dL   Creatinine, Ser 9.01 0.44 - 1.00 mg/dL   Calcium  9.1 8.9 - 10.3 mg/dL   GFR, Estimated 60 (L) >60 mL/min    Comment: (NOTE) Calculated using the CKD-EPI Creatinine Equation (2021)    Anion gap 8 5 - 15    Comment: Performed at [facility], [address] [place], [place] [address]  Glucose, capillary     Status: Abnormal   Collection Time: 06/11/24  8:38 AM  Result Value Ref  Range   Glucose-Capillary 156 (H) 70 - 99 mg/dL    Comment: Glucose reference range applies only to samples taken after fasting for at least 8 hours.  Glucose, capillary     Status: Abnormal   Collection Time: 06/11/24 12:41 PM  Result Value Ref Range   Glucose-Capillary 159 (H) 70 - 99 mg/dL    Comment: Glucose reference range applies only to samples taken after fasting for at least 8 hours.  Glucose, capillary     Status: Abnormal   Collection Time: 06/11/24  5:06 PM  Result Value Ref Range   Glucose-Capillary 162 (H) 70 - 99 mg/dL    Comment: Glucose reference range applies only to samples taken after fasting for at least 8 hours.  Glucose, capillary     Status: Abnormal   Collection Time: 06/11/24  9:33 PM  Result Value Ref Range   Glucose-Capillary 154 (H) 70 - 99 mg/dL    Comment: Glucose reference range applies only to samples taken after fasting for at least 8 hours.  Protime-INR     Status: Abnormal   Collection Time: 06/12/24  5:39 AM  Result Value Ref Range   Prothrombin Time 22.6 (H) 11.4 - 15.2 seconds   INR 1.9 (H) 0.8 - 1.2    Comment: (NOTE) INR goal varies based on device and disease states. Performed at [facility], [address] [place], [place] [address]   CBC     Status: Abnormal   Collection Time: 06/12/24  5:39 AM  Result Value Ref Range   WBC 10.1 4.0 - 10.5 K/uL   RBC 4.39 3.87 - 5.11 MIL/uL   Hemoglobin 13.4 12.0 - 15.0 g/dL   HCT 58.4 63.9 - 53.9 %   MCV 94.5 80.0 - 100.0 fL   MCH 30.5 26.0 - 34.0 pg   MCHC 32.3 30.0 - 36.0 g/dL   RDW 83.9 (H) 88.4 - 84.4 %   Platelets 361 150 - 400 K/uL   nRBC 0.0 0.0 - 0.2 %    Comment: Performed at [facility], [address] [place], [place] [address]  Glucose, capillary     Status: Abnormal   Collection Time: 06/12/24  8:41 AM  Result Value Ref Range   Glucose-Capillary 207 (H) 70 - 99 mg/dL    Comment: Glucose reference range applies only to samples taken after fasting for at  least 8 hours.  Glucose, capillary     Status: Abnormal   Collection Time: 06/12/24 12:09 PM  Result Value Ref Range   Glucose-Capillary 103 (H) 70 - 99 mg/dL  Comment: Glucose reference range applies only to samples taken after fasting for at least 8 hours.  Glucose, capillary     Status: Abnormal   Collection Time: 06/12/24  4:42 PM  Result Value Ref Range   Glucose-Capillary 150 (H) 70 - 99 mg/dL    Comment: Glucose reference range applies only to samples taken after fasting for at least 8 hours.  Glucose, capillary     Status: Abnormal   Collection Time: 06/12/24 10:38 PM  Result Value Ref Range   Glucose-Capillary 164 (H) 70 - 99 mg/dL    Comment: Glucose reference range applies only to samples taken after fasting for at least 8 hours.  Protime-INR     Status: Abnormal   Collection Time: 06/13/24  4:35 AM  Result Value Ref Range   Prothrombin Time 27.8 (H) 11.4 - 15.2 seconds   INR 2.5 (H) 0.8 - 1.2    Comment: (NOTE) INR goal varies based on device and disease states. Performed at [facility] [address] [place], [place] [address]   Basic metabolic panel with GFR     Status: Abnormal   Collection Time: 06/13/24  4:35 AM  Result Value Ref Range   Sodium 146 (H) 135 - 145 mmol/L   Potassium 4.3 3.5 - 5.1 mmol/L   Chloride 115 (H) 98 - 111 mmol/L   CO2 25 22 - 32 mmol/L   Glucose, Bld 154 (H) 70 - 99 mg/dL    Comment: Glucose reference range applies only to samples taken after fasting for at least 8 hours.   BUN 33 (H) 8 - 23 mg/dL   Creatinine, Ser 9.09 0.44 - 1.00 mg/dL   Calcium  9.4 8.9 - 10.3 mg/dL   GFR, Estimated >39 >39 mL/min    Comment: (NOTE) Calculated using the CKD-EPI Creatinine Equation (2021)    Anion gap 6 5 - 15    Comment: Performed at [facility], [address] [place], [place] [address]  Magnesium      Status: None   Collection Time: 06/13/24  4:35 AM  Result Value Ref Range   Magnesium  1.9 1.7 - 2.4 mg/dL     Comment: Performed at [facility], [address] [place], [place] [address]  Glucose, capillary     Status: Abnormal   Collection Time: 06/13/24  7:30 AM  Result Value Ref Range   Glucose-Capillary 156 (H) 70 - 99 mg/dL    Comment: Glucose reference range applies only to samples taken after fasting for at least 8 hours.  Glucose, capillary     Status: Abnormal   Collection Time: 06/13/24 10:56 AM  Result Value Ref Range   Glucose-Capillary 136 (H) 70 - 99 mg/dL    Comment: Glucose reference range applies only to samples taken after fasting for at least 8 hours.  Glucose, capillary     Status: Abnormal   Collection Time: 06/13/24  3:10 PM  Result Value Ref Range   Glucose-Capillary 155 (H) 70 - 99 mg/dL    Comment: Glucose reference range applies only to samples taken after fasting for at least 8 hours.  Glucose, capillary     Status: Abnormal   Collection Time: 06/13/24 11:02 PM  Result Value Ref Range   Glucose-Capillary 143 (H) 70 - 99 mg/dL    Comment: Glucose reference range applies only to samples taken after fasting for at least 8 hours.  Protime-INR     Status: Abnormal   Collection Time: 06/14/24  3:55 AM  Result Value Ref Range   Prothrombin Time 30.2 (H) 11.4 - 15.2 seconds   INR 2.7 (H) 0.8 - 1.2    Comment: (NOTE) INR goal varies based on device and disease states. Performed at [facility], [address] [place], [place] [address]   Basic metabolic panel with GFR     Status: Abnormal   Collection Time: 06/14/24  3:55 AM  Result Value Ref Range   Sodium 143 135 - 145 mmol/L   Potassium 4.3 3.5 - 5.1 mmol/L   Chloride 114 (H) 98 - 111 mmol/L   CO2 24 22 - 32 mmol/L   Glucose, Bld 154 (H) 70 - 99 mg/dL    Comment: Glucose reference range applies only to samples taken after fasting for at least 8 hours.   BUN 29 (H) 8 - 23 mg/dL   Creatinine, Ser 9.14 0.44 - 1.00 mg/dL   Calcium  9.3 8.9 - 10.3 mg/dL   GFR, Estimated >39 >39 mL/min     Comment: (NOTE) Calculated using the CKD-EPI Creatinine Equation (2021)    Anion gap 5 5 - 15    Comment: Performed at [facility], [address] [place], [place] [address]  Magnesium      Status: None   Collection Time: 06/14/24  3:55 AM  Result Value Ref Range   Magnesium  1.7 1.7 - 2.4 mg/dL    Comment: Performed at [facility], [address] [place], [place] [address]  Glucose, capillary     Status: Abnormal   Collection Time: 06/14/24  7:39 AM  Result Value Ref Range   Glucose-Capillary 158 (H) 70 - 99 mg/dL    Comment: Glucose reference range applies only to samples taken after fasting for at least 8 hours.  Glucose, capillary     Status: Abnormal   Collection Time: 06/14/24 11:52 AM  Result Value Ref Range   Glucose-Capillary 119 (H) 70 - 99 mg/dL    Comment: Glucose reference range applies only to samples taken after fasting for at least 8 hours.  Glucose, capillary     Status: Abnormal   Collection Time: 06/14/24  5:11 PM  Result Value Ref Range   Glucose-Capillary 128 (H) 70 - 99 mg/dL    Comment: Glucose reference range applies only to samples taken after fasting for at least 8 hours.  Glucose, capillary     Status: Abnormal   Collection Time: 06/14/24  8:31 PM  Result Value Ref Range   Glucose-Capillary 164 (H) 70 - 99 mg/dL    Comment: Glucose reference range applies only to samples taken after fasting for at least 8 hours.  Protime-INR     Status: Abnormal   Collection Time: 06/15/24  6:16 AM  Result Value Ref Range   Prothrombin Time 30.2 (H) 11.4 - 15.2 seconds   INR 2.7 (H) 0.8 - 1.2    Comment: (NOTE) INR goal varies based on device and disease states. Performed at [facility], [address] [place], [place] [address]   CBC with Differential/Platelet     Status: Abnormal   Collection Time: 06/15/24  6:16 AM  Result Value Ref Range   WBC 6.0 4.0 - 10.5 K/uL   RBC 4.11 3.87 - 5.11 MIL/uL   Hemoglobin 12.7 12.0 - 15.0  g/dL   HCT 60.6 63.9 - 53.9 %   MCV 95.6 80.0 - 100.0 fL   MCH 30.9 26.0 - 34.0 pg   MCHC 32.3 30.0 - 36.0 g/dL   RDW  16.4 (H) 11.5 - 15.5 %   Platelets 331 150 - 400 K/uL   nRBC 0.0 0.0 - 0.2 %   Neutrophils Relative % 55 %   Neutro Abs 3.3 1.7 - 7.7 K/uL   Lymphocytes Relative 23 %   Lymphs Abs 1.4 0.7 - 4.0 K/uL   Monocytes Relative 18 %   Monocytes Absolute 1.1 (H) 0.1 - 1.0 K/uL   Eosinophils Relative 2 %   Eosinophils Absolute 0.1 0.0 - 0.5 K/uL   Basophils Relative 1 %   Basophils Absolute 0.0 0.0 - 0.1 K/uL   Immature Granulocytes 1 %   Abs Immature Granulocytes 0.07 0.00 - 0.07 K/uL    Comment: Performed at [facility], [address] [place], [place] [address]  Basic metabolic panel     Status: Abnormal   Collection Time: 06/15/24  6:16 AM  Result Value Ref Range   Sodium 142 135 - 145 mmol/L   Potassium 4.1 3.5 - 5.1 mmol/L   Chloride 113 (H) 98 - 111 mmol/L   CO2 22 22 - 32 mmol/L   Glucose, Bld 152 (H) 70 - 99 mg/dL    Comment: Glucose reference range applies only to samples taken after fasting for at least 8 hours.   BUN 31 (H) 8 - 23 mg/dL   Creatinine, Ser 9.14 0.44 - 1.00 mg/dL   Calcium  9.1 8.9 - 10.3 mg/dL   GFR, Estimated >39 >39 mL/min    Comment: (NOTE) Calculated using the CKD-EPI Creatinine Equation (2021)    Anion gap 7 5 - 15    Comment: Performed at [facility], [address] [place], [place] [address]  Glucose, capillary     Status: Abnormal   Collection Time: 06/15/24  8:16 AM  Result Value Ref Range   Glucose-Capillary 137 (H) 70 - 99 mg/dL    Comment: Glucose reference range applies only to samples taken after fasting for at least 8 hours.  Glucose, capillary     Status: Abnormal   Collection Time: 06/15/24 12:08 PM  Result Value Ref Range   Glucose-Capillary 118 (H) 70 - 99 mg/dL    Comment: Glucose reference range applies only to samples taken after fasting for at least 8 hours.  Glucose, capillary     Status:  Abnormal   Collection Time: 06/15/24  4:23 PM  Result Value Ref Range   Glucose-Capillary 139 (H) 70 - 99 mg/dL    Comment: Glucose reference range applies only to samples taken after fasting for at least 8 hours.  Glucose, capillary     Status: Abnormal   Collection Time: 06/15/24  8:27 PM  Result Value Ref Range   Glucose-Capillary 158 (H) 70 - 99 mg/dL    Comment: Glucose reference range applies only to samples taken after fasting for at least 8 hours.  Protime-INR     Status: Abnormal   Collection Time: 06/16/24  7:16 AM  Result Value Ref Range   Prothrombin Time 29.5 (H) 11.4 - 15.2 seconds   INR 2.7 (H) 0.8 - 1.2    Comment: (NOTE) INR goal varies based on device and disease states. Performed at [facility], [address] [place], [place] [address]   CBC with Differential/Platelet     Status: Abnormal   Collection Time: 06/16/24  7:16 AM  Result Value Ref Range   WBC 6.3 4.0 - 10.5 K/uL   RBC 4.43 3.87 - 5.11 MIL/uL   Hemoglobin 13.4 12.0 - 15.0 g/dL  HCT 43.1 36.0 - 46.0 %   MCV 97.3 80.0 - 100.0 fL   MCH 30.2 26.0 - 34.0 pg   MCHC 31.1 30.0 - 36.0 g/dL   RDW 83.3 (H) 88.4 - 84.4 %   Platelets 358 150 - 400 K/uL   nRBC 0.0 0.0 - 0.2 %   Neutrophils Relative % 57 %   Neutro Abs 3.6 1.7 - 7.7 K/uL   Lymphocytes Relative 23 %   Lymphs Abs 1.5 0.7 - 4.0 K/uL   Monocytes Relative 16 %   Monocytes Absolute 1.0 0.1 - 1.0 K/uL   Eosinophils Relative 2 %   Eosinophils Absolute 0.1 0.0 - 0.5 K/uL   Basophils Relative 1 %   Basophils Absolute 0.1 0.0 - 0.1 K/uL   Immature Granulocytes 1 %   Abs Immature Granulocytes 0.07 0.00 - 0.07 K/uL    Comment: Performed at [facility], [address], [place], [place] [address]  Basic metabolic panel     Status: Abnormal   Collection Time: 06/16/24  7:16 AM  Result Value Ref Range   Sodium 142 135 - 145 mmol/L   Potassium 3.9 3.5 - 5.1 mmol/L   Chloride 110 98 - 111 mmol/L   CO2 23 22 - 32 mmol/L    Glucose, Bld 145 (H) 70 - 99 mg/dL    Comment: Glucose reference range applies only to samples taken after fasting for at least 8 hours.   BUN 30 (H) 8 - 23 mg/dL   Creatinine, Ser 9.03 0.44 - 1.00 mg/dL   Calcium  9.3 8.9 - 10.3 mg/dL   GFR, Estimated >39 >39 mL/min    Comment: (NOTE) Calculated using the CKD-EPI Creatinine Equation (2021)    Anion gap 9 5 - 15    Comment: Performed at [facility], [address] [place], [place] [address]  Glucose, capillary     Status: Abnormal   Collection Time: 06/16/24  9:03 AM  Result Value Ref Range   Glucose-Capillary 196 (H) 70 - 99 mg/dL    Comment: Glucose reference range applies only to samples taken after fasting for at least 8 hours.  Glucose, capillary     Status: Abnormal   Collection Time: 06/16/24 11:01 AM  Result Value Ref Range   Glucose-Capillary 144 (H) 70 - 99 mg/dL    Comment: Glucose reference range applies only to samples taken after fasting for at least 8 hours.  POCT INR     Status: Abnormal   Collection Time: 06/26/24 10:56 AM  Result Value Ref Range   INR 1.6 (A) 2.0 - 3.0   POC INR    CULTURE, URINE COMPREHENSIVE     Status: None   Collection Time: 06/26/24 12:28 PM   Specimen: Urine  Result Value Ref Range   MICRO NUMBER: [id number]    SPECIMEN QUALITY: Adequate    Source OTHER (SPECIFY)    STATUS: FINAL    RESULT:      Mixed genital flora isolated. These superficial bacteria are not indicative of a urinary tract infection. No further organism identification is warranted on this specimen. If clinically indicated, recollect clean-catch, mid-stream urine and transfer  immediately to Urine Culture Transport Tube.   POCT Urinalysis Dipstick     Status: Abnormal   Collection Time: 07/27/24  2:36 PM  Result Value Ref Range   Color, UA yellow    Clarity, UA cloudy    Glucose, UA Positive (A) Negative   Bilirubin, UA negative  Ketones, UA negative    Spec Grav, UA 1.020 1.010 - 1.025   Blood, UA  negative    pH, UA 5.0 5.0 - 8.0   Protein, UA Negative Negative   Urobilinogen, UA 0.2 0.2 or 1.0 E.U./dL   Nitrite, UA negtaive    Leukocytes, UA Negative Negative   Appearance cloudy    Odor none   Basic Metabolic Panel (BMET)     Status: Abnormal   Collection Time: 07/27/24  2:37 PM  Result Value Ref Range   Glucose, Bld 176 (H) 65 - 99 mg/dL    Comment: .            Fasting reference interval . For someone without known diabetes, a glucose value >125 mg/dL indicates that they may have diabetes and this should be confirmed with a follow-up test. .    BUN 27 (H) 7 - 25 mg/dL   Creat 8.67 (H) 9.39 - 1.00 mg/dL   eGFR 42 (L) > OR = 60 mL/min/1.62m2   BUN/Creatinine Ratio 20 6 - 22 (calc)   Sodium 143 135 - 146 mmol/L   Potassium 4.0 3.5 - 5.3 mmol/L   Chloride 101 98 - 110 mmol/L   CO2 30 20 - 32 mmol/L   Calcium  9.4 8.6 - 10.4 mg/dL  B Nat Peptide     Status: Abnormal   Collection Time: 07/27/24  2:37 PM  Result Value Ref Range   Brain Natriuretic Peptide 231 (H) <100 pg/mL    Comment: . BNP levels increase with age in the general population with the highest values seen in individuals greater than 53 years of age. Reference: J. Am. Penne. Cardiol. 2002; 59:023-017. SABRA   POCT glycosylated hemoglobin (Hb A1C)     Status: Abnormal   Collection Time: 09/05/24  1:10 PM  Result Value Ref Range   Hemoglobin A1C 6.1 (A) 4.0 - 5.6 %   HbA1c POC (<> result, manual entry)     HbA1c, POC (prediabetic range)     HbA1c, POC (controlled diabetic range)      Diabetic Foot Exam:     PHQ2/9:    09/05/2024   12:56 PM 06/26/2024    9:59 AM 05/25/2024    3:30 PM 05/05/2024    1:18 PM 01/05/2024    1:14 PM  Depression screen PHQ 2/9  Decreased Interest 0 0 0 0 0  Down, Depressed, Hopeless 0 0 0 0 0  PHQ - 2 Score 0 0 0 0 0  Altered sleeping  0 0  0  Tired, decreased energy  0 0  0  Change in appetite  0 0  0  Feeling bad or failure about yourself   0 0  0  Trouble  concentrating  0 0  0  Moving slowly or fidgety/restless  0 0  0  Suicidal thoughts  0 0  0  PHQ-9 Score  0  0   0   Difficult doing work/chores  Not difficult at all Not difficult at all  Not difficult at all     Data saved with a previous flowsheet row definition    phq 9 is negative  Fall Risk:    09/05/2024   12:56 PM 06/26/2024    9:59 AM 05/21/2024   10:52 AM 05/05/2024    1:18 PM 01/05/2024    1:14 PM  Fall Risk   Falls in the past year? 0 0 0 0 0  Number falls in past yr: 0 0 0  0 0  Injury with Fall? 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 CKD stage 3a with GFR 43. No hypoglycemia reported. - Ordered comprehensive metabolic panel including kidney function, lipid panel, B12, vitamin D , parathyroid hormone, uric acid, CBC, and liver enzymes. - Continue Trulicity , metformin , and Farxiga . - Monitor A1c and adjust metformin  if hypoglycemia occurs.  Morbid Obesity - BMI over 40 Weight decreased to 241.6 lbs. Motivated for further weight loss. Trulicity  aids weight management. - Continue current weight management strategies and medications.  Atrial fibrillation On Eliquis  and metoprolol . BP 108/72 limits additional antihypertensives. No recent palpitations. - Continue Eliquis  and metoprolol . - Monitor blood pressure and symptoms of dizziness. - Consult cardiologist if dizziness occurs or if medication adjustments are needed.  Hypertension BP well-controlled at 108/72. On metoprolol  and diltiazem . Low BP limits additional antihypertensives. -  Continue metoprolol  and diltiazem . - Monitor blood pressure and symptoms of dizziness.  Obstructive sleep apnea Compliant with CPAP. Reports dry mouth likely due to CPAP. - Continue CPAP therapy.  Gout Well-controlled with allopurinol . - Continue allopurinol  100 mg twice daily. - Ordered uric acid level.  Chronic pain syndrome Pain due to osteoarthritis and low back pain - see psyatrist. On gabapentin  and methocarbamol . - Continue gabapentin  and methocarbamol . - Consulted with Ortho for potential joint injections.  Vitamin B12 deficiency On oral B12 supplements. Plan to assess absorption and consider sublingual form. - Ordered B12 level. - Will consider switching to sublingual B12 if levels are low.  Secondary hyperparathyroidism of renal origin Due to CKD. Plan to check parathyroid hormone levels. - Ordered parathyroid hormone level.

## 2024-09-05 NOTE — Addendum Note (Signed)
 Addended by: MARSH, Angle Karel E on: 09/05/2024 03:10 PM   Modules accepted: Level of Service

## 2024-09-05 NOTE — Progress Notes (Signed)
 S:     Reason for visit: ?  Amber Avery is a 78 y.o. female with a history of diabetes (type 2), who presents today for a follow up diabetes Face to Face pharmacotherapy visit.? Pertinent PMH also includes HTN, CAD, Afib, OSA, asthma, gout, HLD, obesity.   Care Team: Primary Care Provider: Sowles, Krichna, MD  Current diabetes medications include: Farxiga  10 mg daily, Trulicity  4.5 mg weekly, metformin  XR 750 mg daily Current hypertension medications include: Cardizem  CD 240 mg daily, furosemide  20 mg daily, metoprolol  tartrate 50 mg BID Current hyperlipidemia medications include: atorvastatin  20 mg daily   Patient reports adherence to taking all medications as prescribed.    Have you been experiencing any side effects to the medications prescribed? no Do you have any problems obtaining medications due to transportation or finances? yes Insurance coverage: BCBS Medicare   Patient denies hypoglycemic events.  Current medication access support:  Farxiga  via [employer] approved until 10/18/25 Trulicity  via [employer] approved until 10/18/24  Patient denies hypoglycemic events.  Reported home fasting blood sugars: 120-135 mg/dL  DM Prevention:  Statin: Taking; moderate intensity.?  ACE/ARB: no;  History of chronic kidney disease? yes Last urinary albumin/creatinine ratio:  Lab Results  Component Value Date   MICRALBCREAT 30 (H) 09/07/2023   MICRALBCREAT 30 (H) 08/19/2022   MICRALBCREAT 41 (H) 12/17/2021   MICRALBCREAT NOTE 12/13/2020   MICRALBCREAT 13 12/14/2019   MICRALBCREAT 18 04/14/2019   Last eye exam:  Lab Results  Component Value Date   HMDIABEYEEXA No Retinopathy 09/23/2023   Lab Results  Component Value Date   HMDIABEYEEXA No Retinopathy 09/23/2023   Last foot exam: No foot exam found Tobacco Use:  Tobacco Use: Medium Risk (09/05/2024)   Patient History    Smoking Tobacco Use: Former    Smokeless Tobacco Use: Never    Passive Exposure: Not on file    O:  Vitals:  Wt Readings from Last 3 Encounters:  09/05/24 241 lb 9.6 oz (109.6 kg)  08/17/24 249 lb (112.9 kg)  06/26/24 249 lb (112.9 kg)   BP Readings from Last 3 Encounters:  09/05/24 108/72  08/17/24 110/72  07/27/24 124/82   Pulse Readings from Last 3 Encounters:  09/05/24 60  08/17/24 61  07/27/24 87     Labs:?  Lab Results  Component Value Date   HGBA1C 6.1 (A) 09/05/2024   HGBA1C 7.3 (A) 05/05/2024   HGBA1C 7.1 (A) 01/05/2024   GLUCOSE 176 (H) 07/27/2024   MICRALBCREAT 30 (H) 09/07/2023   MICRALBCREAT 30 (H) 08/19/2022   MICRALBCREAT 41 (H) 12/17/2021   CREATININE 1.32 (H) 07/27/2024   CREATININE 0.96 06/16/2024   CREATININE 0.85 06/15/2024    Lab Results  Component Value Date   CHOL 152 09/07/2023   LDLCALC 68 09/07/2023   LDLCALC 59 08/19/2022   LDLCALC 83 12/17/2021   HDL 60 09/07/2023   TRIG 154 (H) 09/07/2023   TRIG 147 08/19/2022   TRIG 128 12/17/2021   ALT 54 (H) 06/06/2024   ALT 13 09/07/2023   AST 83 (H) 06/06/2024   AST 16 09/07/2023      Chemistry      Component Value Date/Time   NA 143 07/27/2024 1437   NA 142 07/31/2016 0910   K 4.0 07/27/2024 1437   CL 101 07/27/2024 1437   CO2 30 07/27/2024 1437   BUN 27 (H) 07/27/2024 1437   BUN 39 (H) 07/31/2016 0910   CREATININE 1.32 (H) 07/27/2024 1437  Component Value Date/Time   CALCIUM  9.4 07/27/2024 1437   ALKPHOS 86 06/06/2024 2259   AST 83 (H) 06/06/2024 2259   ALT 54 (H) 06/06/2024 2259   BILITOT 1.3 (H) 06/06/2024 2259   BILITOT 0.4 07/31/2016 0910       The 10-year ASCVD risk score (Arnett DK, et al., 2019) is: 33.6%  Lab Results  Component Value Date   MICRALBCREAT 30 (H) 09/07/2023   MICRALBCREAT 30 (H) 08/19/2022   MICRALBCREAT 41 (H) 12/17/2021   MICRALBCREAT NOTE 12/13/2020   MICRALBCREAT 13 12/14/2019   MICRALBCREAT 18 04/14/2019    A/P: Diabetes currently controlled with a most recent A1c of 6.1% on 09/05/24. Patient is able to verbalize  appropriate hypoglycemia management plan. Medication adherence appears appropriate. Received patient portion for Trulicity  re-enrollment today.  -Continued GLP-1 Trulicity  (dulaglutide ) 4.5 mg weekly  -Continued SGLT2-I Farxiga  (dapagliflozin )10 mg daily.  -Continued metformin  XR 750 mg daily.  -Extensively discussed pathophysiology of diabetes, recommended lifestyle interventions, dietary effects on blood sugar control.  -Counseled on s/sx of and management of hypoglycemia.  -Will fax [employer] paperwork to PAP team for Trulicity  re-enrollment  ASCVD risk - secondary prevention in patient with diabetes. Last LDL is 68 mg/dL, not at goal of <44 mg/dL.  -Continued atorvastatin  20 mg daily.    Patient verbalized understanding of treatment plan. Total time patient counseling 30 minutes.  Follow-up:  Pharmacist on 01/22/25 PCP clinic visit on 01/03/25  [name], PharmD, CPP Clinical Pharmacist [facility] [phone]

## 2024-09-05 NOTE — Progress Notes (Deleted)
 S:     No chief complaint on file.   Reason for visit: ?  Amber Avery is a 78 y.o. female with a history of diabetes (type 2), who presents today for a follow up diabetes pharmacotherapy visit.? Pertinent PMH also includes HTN, CAD, Afib, OSA, asthma, gout, HLD, obesity.   Care Team: Primary Care Provider: Sowles, Krichna, MD  Patient was admitted to [facility] from 8/19-8/29/25 with sepsis d/t gram-negative UTI. She was also found to have Afib with RVR at that time. She was discharged home on warfarin and Cardizem  CD.   At last visit with cardiology on 07/03/24, she reported non-adherence to warfarin d/t the need for INR monitoring. She was started on Eliquis  at that time.   Per note from clinical pharmacist Eliquis  is a tier 3 medication for patient. Patient has a $375 annual deductible (impacting tiers 3-5) for this plan and after deductible, tier 3 copayment is $45 for 1 month supply ($135 for 3 month supply) from preferred retail pharmacy, but is $90 for 3 month supply if received from preferred mail order pharmacy.   Current diabetes medications include: Farxiga  10 mg daily, Trulicity  4.5 mg weekly, metformin  XR 750 mg daily Current hypertension medications include: Cardizem  CD 240 mg daily, furosemide  20 mg daily, metoprolol  tartrate 50 mg BID Current hyperlipidemia medications include: atorvastatin  20 mg daily   Patient reports adherence to taking all medications as prescribed.   Have you been experiencing any side effects to the medications prescribed? no Do you have any problems obtaining medications due to transportation or finances? yes Insurance coverage: BCBS Medicare  Patient denies hypoglycemic events.  Reported home fasting blood sugars: 120-135 mg/dL    DM Prevention:  Statin: Taking; moderate intensity.?  History of albuminuria? yes, last UACR on 09/07/23 = 30 mg/g Last eye exam:  Lab Results  Component Value Date   HMDIABEYEEXA No Retinopathy 09/23/2023    Tobacco Use:  Tobacco Use: Medium Risk (09/05/2024)   Patient History    Smoking Tobacco Use: Former    Smokeless Tobacco Use: Never    Passive Exposure: Not on file   O:  Vitals:  Wt Readings from Last 3 Encounters:  09/05/24 241 lb 9.6 oz (109.6 kg)  08/17/24 249 lb (112.9 kg)  06/26/24 249 lb (112.9 kg)   BP Readings from Last 3 Encounters:  09/05/24 108/72  08/17/24 110/72  07/27/24 124/82   Pulse Readings from Last 3 Encounters:  09/05/24 60  08/17/24 61  07/27/24 87     Labs:?  Lab Results  Component Value Date   HGBA1C 6.1 (A) 09/05/2024   HGBA1C 7.3 (A) 05/05/2024   HGBA1C 7.1 (A) 01/05/2024   GLUCOSE 176 (H) 07/27/2024   MICRALBCREAT 30 (H) 09/07/2023   MICRALBCREAT 30 (H) 08/19/2022   MICRALBCREAT 41 (H) 12/17/2021   CREATININE 1.32 (H) 07/27/2024   CREATININE 0.96 06/16/2024   CREATININE 0.85 06/15/2024    Lab Results  Component Value Date   CHOL 152 09/07/2023   LDLCALC 68 09/07/2023   LDLCALC 59 08/19/2022   LDLCALC 83 12/17/2021   HDL 60 09/07/2023   TRIG 154 (H) 09/07/2023   TRIG 147 08/19/2022   TRIG 128 12/17/2021   ALT 54 (H) 06/06/2024   ALT 13 09/07/2023   AST 83 (H) 06/06/2024   AST 16 09/07/2023      Chemistry      Component Value Date/Time   NA 143 07/27/2024 1437   NA 142 07/31/2016 0910  K 4.0 07/27/2024 1437   CL 101 07/27/2024 1437   CO2 30 07/27/2024 1437   BUN 27 (H) 07/27/2024 1437   BUN 39 (H) 07/31/2016 0910   CREATININE 1.32 (H) 07/27/2024 1437      Component Value Date/Time   CALCIUM  9.4 07/27/2024 1437   ALKPHOS 86 06/06/2024 2259   AST 83 (H) 06/06/2024 2259   ALT 54 (H) 06/06/2024 2259   BILITOT 1.3 (H) 06/06/2024 2259   BILITOT 0.4 07/31/2016 0910       The 10-year ASCVD risk score (Arnett DK, et al., 2019) is: 33.6%  Lab Results  Component Value Date   MICRALBCREAT 30 (H) 09/07/2023   MICRALBCREAT 30 (H) 08/19/2022   MICRALBCREAT 41 (H) 12/17/2021   MICRALBCREAT NOTE 12/13/2020    MICRALBCREAT 13 12/14/2019   MICRALBCREAT 18 04/14/2019    A/P: Diabetes currently controlled with a most recent A1c of 6.1% on 09/05/24 (goal <7-7.5% given age and comorbidities). Patient is able to verbalize appropriate hypoglycemia management plan. Medication adherence appears appropriate.  -Continued GLP-1 Trulicity  (dulaglutide ) 4.5 mg weekly .  -Continued SGLT2-I Farxiga  (dapagliflozin )10 mg daily.  -Continued metformin  XR 750 mg daily.  -Extensively discussed pathophysiology of diabetes, recommended lifestyle interventions, dietary effects on blood sugar control.  -Counseled on s/sx of and management of hypoglycemia.   ASCVD risk - secondary prevention in patient with diabetes. Last LDL is 68 mg/dL, not at goal of <44 mg/dL.  -Continued atorvastatin  20 mg daily.    Afib - currently on rate control and AC therapy. CHADSVASc of 5. Patient reported some cost concerns at the pharmacy -Recommended filling Eliquis  via mail order pharmacy for a 3 month supply for decreased cost.  Patient verbalized understanding of treatment plan. Total time patient counseling 45 minutes.  Follow-up:  Pharmacist on 09/05/24 PCP clinic visit on 09/05/24  [name], PharmD Clinical Pharmacist [facility] [phone]

## 2024-09-06 ENCOUNTER — Ambulatory Visit: Payer: Self-pay | Admitting: Family Medicine

## 2024-09-06 LAB — CBC WITH DIFFERENTIAL/PLATELET
Absolute Lymphocytes: 1626 {cells}/uL (ref 850–3900)
Absolute Monocytes: 1100 {cells}/uL — ABNORMAL HIGH (ref 200–950)
Basophils Absolute: 66 {cells}/uL (ref 0–200)
Basophils Relative: 0.7 %
Eosinophils Absolute: 179 {cells}/uL (ref 15–500)
Eosinophils Relative: 1.9 %
HCT: 44.5 % (ref 35.0–45.0)
Hemoglobin: 14.2 g/dL (ref 11.7–15.5)
MCH: 29.2 pg (ref 27.0–33.0)
MCHC: 31.9 g/dL — ABNORMAL LOW (ref 32.0–36.0)
MCV: 91.4 fL (ref 80.0–100.0)
MPV: 10.5 fL (ref 7.5–12.5)
Monocytes Relative: 11.7 %
Neutro Abs: 6430 {cells}/uL (ref 1500–7800)
Neutrophils Relative %: 68.4 %
Platelets: 249 10*3/uL (ref 140–400)
RBC: 4.87 Million/uL (ref 3.80–5.10)
RDW: 14.9 % (ref 11.0–15.0)
Total Lymphocyte: 17.3 %
WBC: 9.4 10*3/uL (ref 3.8–10.8)

## 2024-09-06 LAB — COMPREHENSIVE METABOLIC PANEL WITH GFR
AG Ratio: 1.4 (calc) (ref 1.0–2.5)
ALT: 16 U/L (ref 6–29)
AST: 19 U/L (ref 10–35)
Albumin: 4.1 g/dL (ref 3.6–5.1)
Alkaline phosphatase (APISO): 73 U/L (ref 37–153)
BUN/Creatinine Ratio: 26 (calc) — ABNORMAL HIGH (ref 6–22)
BUN: 34 mg/dL — ABNORMAL HIGH (ref 7–25)
CO2: 29 mmol/L (ref 20–32)
Calcium: 9.9 mg/dL (ref 8.6–10.4)
Chloride: 105 mmol/L (ref 98–110)
Creat: 1.29 mg/dL — ABNORMAL HIGH (ref 0.60–1.00)
Globulin: 3 g/dL (ref 1.9–3.7)
Glucose, Bld: 100 mg/dL — ABNORMAL HIGH (ref 65–99)
Potassium: 4.1 mmol/L (ref 3.5–5.3)
Sodium: 146 mmol/L (ref 135–146)
Total Bilirubin: 0.7 mg/dL (ref 0.2–1.2)
Total Protein: 7.1 g/dL (ref 6.1–8.1)
eGFR: 43 mL/min/{1.73_m2} — ABNORMAL LOW

## 2024-09-06 LAB — MICROALBUMIN / CREATININE URINE RATIO
Creatinine, Urine: 38 mg/dL (ref 20–275)
Microalb Creat Ratio: 87 mg/g{creat} — ABNORMAL HIGH
Microalb, Ur: 3.3 mg/dL

## 2024-09-06 LAB — EXTRA

## 2024-09-06 LAB — B12 AND FOLATE PANEL
Folate: 5.8 ng/mL
Vitamin B-12: 671 pg/mL (ref 200–1100)

## 2024-09-06 LAB — LIPID PANEL
Cholesterol: 122 mg/dL
HDL: 54 mg/dL
LDL Cholesterol (Calc): 47 mg/dL
Non-HDL Cholesterol (Calc): 68 mg/dL
Total CHOL/HDL Ratio: 2.3 (calc)
Triglycerides: 127 mg/dL

## 2024-09-06 LAB — PARATHYROID HORMONE, INTACT (NO CA): PTH: 46 pg/mL (ref 16–77)

## 2024-09-06 LAB — VITAMIN D 25 HYDROXY (VIT D DEFICIENCY, FRACTURES): Vit D, 25-Hydroxy: 51 ng/mL (ref 30–100)

## 2024-09-06 LAB — URIC ACID: Uric Acid, Serum: 6 mg/dL (ref 2.5–7.0)

## 2024-09-06 NOTE — Telephone Encounter (Signed)
 Received pt portion [employer] Trulicity  with proof of income, faxing it to [facility] along provider portion and proof of income,Ins card.

## 2024-09-07 ENCOUNTER — Ambulatory Visit (INDEPENDENT_AMBULATORY_CARE_PROVIDER_SITE_OTHER): Admitting: Sleep Medicine

## 2024-09-07 ENCOUNTER — Encounter: Payer: Self-pay | Admitting: Sleep Medicine

## 2024-09-07 VITALS — BP 112/72 | HR 91 | Temp 98.7°F | Ht 61.0 in | Wt 242.0 lb

## 2024-09-07 DIAGNOSIS — I10 Essential (primary) hypertension: Secondary | ICD-10-CM

## 2024-09-07 DIAGNOSIS — Z6841 Body Mass Index (BMI) 40.0 and over, adult: Secondary | ICD-10-CM

## 2024-09-07 DIAGNOSIS — G4733 Obstructive sleep apnea (adult) (pediatric): Secondary | ICD-10-CM | POA: Diagnosis not present

## 2024-09-07 NOTE — Progress Notes (Signed)
 Name:Amber Avery MRN: [id number] DOB: [birth year]   CHIEF COMPLAINT:  ESTABLISH CARE FOR OSA   HISTORY OF PRESENT ILLNESS: Ms. Birdsall is a 78 y.o. w/ a h/o OSA, HTN, atrial fibrillation, DMII and morbid obesity who presents to establish care for OSA. Reports that she was initially diagnosed with OSA several years ago and was subsequently started on CPAP therapy. Reports using CPAP therapy every night, which is confirmed by compliance data. She is currently using the Airfit N30i nasal mask, which is comfortable. She is currently on CPAP set to 18 cm H2O with 2 lpm O2 nightly. Reports air leaks with her current mask. Reports feeling more refreshed upon awakening with CPAP therapy.   Reports nocturnal awakenings due to nocturia, however does not have difficulty falling back to sleep. Reports significant weight loss. Admits to dry mouth. Denies morning headaches, RLS symptoms, dream enactment, cataplexy, hypnagogic or hypnapompic hallucinations. Reports a family history of sleep apnea. Denies drowsy driving. Drinks 1 cup of coffee daily and soda occasionally, denies alcohol , tobacco or illicit drug use.   Bedtime 11 pm Sleep onset 2 mins Rise time 7:30 am   EPWORTH SLEEP SCORE 10    09/07/2024    2:58 PM 07/29/2018    2:47 PM  Results of the Epworth flowsheet  Sitting and reading 2 3  Watching TV 2 3  Sitting, inactive in a public place (e.g. a theatre or a meeting) 0 0  As a passenger in a car for an hour without a break 3 0  Lying down to rest in the afternoon when circumstances permit 0 3  Sitting and talking to someone 0 0  Sitting quietly after a lunch without alcohol  3 3  In a car, while stopped for a few minutes in traffic 0 0  Total score 10 12    PAST MEDICAL HISTORY :   has a past medical history of Allergy, Arthritis, Asthma, Diabetes mellitus without complication (HCC), Gout, Hyperlipidemia, Hypertension, Iron  deficiency anemia (04/20/2018), Oxygen   deficiency, PONV (postoperative nausea and vomiting), Shortness of breath dyspnea, and Sleep apnea.  has a past surgical history that includes Cholecystectomy (10/20/1975); Hemorrhoid banding (10/19/1978); Hernia repair (10/19/2009); Appendectomy; Tonsillectomy and adenoidectomy; Dilation and curettage of uterus; Hemorroidectomy; Temporal Area Excision Biopsy; Colonoscopy with propofol  (N/A, 01/21/2016); Esophagogastroduodenoscopy (egd) with propofol  (N/A, 08/16/2018); Cataract extraction w/PHACO (Left, 08/14/2020); Cataract extraction w/PHACO (Right, 09/04/2020); and Eye surgery. Prior to Admission medications   Medication Sig Start Date End Date Taking? Authorizing Provider  acetaminophen  (TYLENOL ) 500 MG tablet Take 1 tablet (500 mg total) by mouth every 6 (six) hours as needed. 08/09/17  Yes Sowles, Krichna, MD  albuterol  (VENTOLIN  HFA) 108 (90 Base) MCG/ACT inhaler Inhale 2 puffs into the lungs every 6 (six) hours as needed for wheezing or shortness of breath. 02/23/23  Yes Sowles, Krichna, MD  allopurinol  (ZYLOPRIM ) 100 MG tablet Take 1 tablet (100 mg total) by mouth 2 (two) times daily. 05/05/24  Yes Sowles, Krichna, MD  apixaban  (ELIQUIS ) 5 MG TABS tablet Take 1 tablet (5 mg total) by mouth every 12 (twelve) hours 07/04/24  Yes [provider]  atorvastatin  (LIPITOR) 20 MG tablet TAKE 1 TABLET BY MOUTH DAILY FOR CHOLESTEROL 05/05/24  Yes Sowles, Krichna, MD  Blood Glucose Monitoring Suppl (CONTOUR NEXT EZ) w/Device KIT  06/29/24  Yes [provider]  Blood Glucose Monitoring Suppl DEVI 1 each by Does not apply route in the morning, at noon, and at bedtime. 06/26/24  Yes Sowles, Krichna, MD  Blood Pressure KIT 1 each by Does not apply route daily. 07/07/22  Yes Sowles, Krichna, MD  clobetasol  ointment (TEMOVATE ) 0.05 % Apply 1 Application topically at bedtime. Apply to the affected area nightly for 4 weeks.   Yes [provider]  CONTOUR NEXT TEST test strip daily. 06/29/24  Yes  [provider]  dapagliflozin  propanediol (FARXIGA ) 10 MG TABS tablet Take 1 tablet (10 mg total) by mouth daily before breakfast. 07/31/24  Yes Sowles, Krichna, MD  diltiazem  (CARDIZEM  CD) 240 MG 24 hr capsule Take 1 capsule (240 mg total) by mouth daily. 06/26/24  Yes Sowles, Krichna, MD  Dulaglutide  (TRULICITY ) 4.5 MG/0.5ML SOAJ Inject 4.5 mg as directed once a week. 05/08/24  Yes Sowles, Krichna, MD  furosemide  (LASIX ) 20 MG tablet Take 1 tablet (20 mg total) by mouth 2 (two) times daily. Second dose around noon 07/27/24  Yes Bernardo Fend, DO  gabapentin  (NEURONTIN ) 300 MG capsule TAKE 1 CAPSULE BY MOUTH IN THE MORNING, 1 CAPSULE IN THE EVENING AND 2 CAPSULES AT NIGHT 07/31/24  Yes Sowles, Krichna, MD  Glucose Blood (BLOOD GLUCOSE TEST STRIPS) STRP 1 each by In Vitro route daily. 06/26/24  Yes Sowles, Krichna, MD  Lancet Device MISC 1 each by Does not apply route as directed. May substitute to any manufacturer covered by patient's insurance. 06/26/24 09/24/24 Yes Sowles, Krichna, MD  Lancets JANETT ULTRASOFT) lancets Use as instructed 07/07/22  Yes Sowles, Krichna, MD  Lancets Misc. MISC 1 each by Does not apply route daily. May substitute to any manufacturer covered by patient's insurance. 06/26/24  Yes Sowles, Krichna, MD  metFORMIN  (GLUCOPHAGE -XR) 750 MG 24 hr tablet Take 1 tablet (750 mg total) by mouth daily with breakfast. 05/05/24  Yes Sowles, Krichna, MD  methocarbamol  (ROBAXIN ) 500 MG tablet Take 500 mg by mouth 3 (three) times daily.   Yes Avanell Katz, MD  metoprolol  tartrate (LOPRESSOR ) 50 MG tablet Take 1 tablet (50 mg total) by mouth 2 (two) times daily. 06/15/24  Yes Dorinda Drue DASEN, MD  polyethylene glycol (MIRALAX  / GLYCOLAX ) 17 g packet Take 17 g by mouth daily as needed for mild constipation. 06/15/24  Yes Dorinda Drue DASEN, MD  potassium chloride  SA (KLOR-CON  M) 20 MEQ tablet TAKE 1 TABLET BY MOUTH DAILY 05/08/24  Yes Sowles, Krichna, MD  traMADol  (ULTRAM ) 50 MG tablet Take  0.5-1 tablets (25-50 mg total) by mouth 2 (two) times daily as needed. 06/16/24  Yes Dorinda Drue DASEN, MD  triamcinolone  ointment (KENALOG ) 0.1 % APPLY TOPICALLY TWICE DAILY AS NEEDED. GENERIC EQUIVALENT FOR KENALOG  08/22/24  Yes Sowles, Krichna, MD  vitamin B-12 (CYANOCOBALAMIN ) 500 MCG tablet Take 500 mcg by mouth daily.   Yes [provider]  Vitamin D , Cholecalciferol , 25 MCG (1000 UT) TABS Take 1 capsule by mouth daily. 12/14/19  Yes Sowles, Krichna, MD  VOLTAREN  1 % GEL Apply 4 g topically 4 (four) times daily. 11/24/23  Yes Sowles, Krichna, MD   Allergies  Allergen Reactions   Codeine Hives and Nausea And Vomiting    FAMILY HISTORY:  family history includes Arthritis in her sister, sister, and sister; Breast cancer (age of onset: 45) in her maternal aunt; COPD in her father; Cancer in her mother and sister; Diabetes in her sister; GER disease in her sister; Hearing loss in her father and sister; Heart disease in her father and sister; Heart murmur in her sister; Hypertension in her mother, sister, and sister; Kidney disease in her mother; Leukemia  in her maternal aunt; Lupus in her sister; Obesity in her sister; Osteopenia in her sister. SOCIAL HISTORY:  reports that she quit smoking about 42 years ago. Her smoking use included cigarettes. She started smoking about 57 years ago. She has a 30 pack-year smoking history. She has never used smokeless tobacco. She reports that she does not currently use alcohol . She reports that she does not use drugs.   Review of Systems:  Gen:  Denies  fever, sweats, chills weight loss  HEENT: Denies blurred vision, double vision, ear pain, eye pain, hearing loss, nose bleeds, sore throat Cardiac:  No dizziness, chest pain or heaviness, chest tightness,edema, No JVD Resp:   No cough, -sputum production, -shortness of breath,-wheezing, -hemoptysis,  Gi: Denies swallowing difficulty, stomach pain, nausea or vomiting, diarrhea, constipation, bowel  incontinence Gu:  Denies bladder incontinence, burning urine Ext:   Denies Joint pain, stiffness or swelling Skin: Denies  skin rash, easy bruising or bleeding or hives Endoc:  Denies polyuria, polydipsia , polyphagia or weight change Psych:   Denies depression, insomnia or hallucinations  Other:  All other systems negative  VITAL SIGNS: BP 112/72   Pulse 91   Temp 98.7 F (37.1 C)   Ht 5' 1 (1.549 m)   Wt 242 lb (109.8 kg)   SpO2 99%   BMI 45.73 kg/m    Physical Examination:   General Appearance: No distress  EYES PERRLA, EOM intact.   NECK Supple, No JVD Pulmonary: normal breath sounds, No wheezing.  CardiovascularNormal S1,S2.  No m/r/g.   Abdomen: Benign, Soft, non-tender. Skin:   warm, no rashes, no ecchymosis  Extremities: normal, no cyanosis, clubbing. Neuro:without focal findings,  speech normal  PSYCHIATRIC: Mood, affect within normal limits.   ASSESSMENT AND PLAN  OSA Patient is using and benefiting from CPAP therapy. Due to air leaks and significant weight loss, will complete in lab CPAP titration study. I suspect that patient no longer needs nocturnal O2 due to weight loss. Discussed the consequences of untreated sleep apnea. Advised not to drive drowsy for safety of patient and others. Will complete further evaluation with CPAP titration and follow up to review results.    HTN Stable, on current management. Following with PCP.   Morbid obesity Counseled patient on diet and lifestyle modification.    MEDICATION ADJUSTMENTS/LABS AND TESTS ORDERED: Recommend Sleep Study   Patient  satisfied with Plan of action and management. All questions answered  Follow up to review CPAP titration study results and treatment plan.   I spent a total of 46 minutes reviewing chart data, face-to-face evaluation with the patient, counseling and coordination of care as detailed above.    Zacharie Portner, M.D.  Sleep [facility]

## 2024-09-08 DIAGNOSIS — M25562 Pain in left knee: Secondary | ICD-10-CM | POA: Diagnosis not present

## 2024-09-08 DIAGNOSIS — M25561 Pain in right knee: Secondary | ICD-10-CM | POA: Diagnosis not present

## 2024-09-08 DIAGNOSIS — M170 Bilateral primary osteoarthritis of knee: Secondary | ICD-10-CM | POA: Diagnosis not present

## 2024-09-08 DIAGNOSIS — G8929 Other chronic pain: Secondary | ICD-10-CM | POA: Diagnosis not present

## 2024-09-08 NOTE — Progress Notes (Signed)
 Amber Avery                                          MRN: [id number]   09/08/2024   The [facility] Quality Team Specialist reviewed this patient medical record for the purposes of chart review for care gap closure. The following were reviewed: abstraction for care gap closure-kidney health evaluation for diabetes:eGFR  and uACR.    [facility]BCI Quality Team

## 2024-09-12 DIAGNOSIS — H179 Unspecified corneal scar and opacity: Secondary | ICD-10-CM | POA: Diagnosis not present

## 2024-09-12 DIAGNOSIS — Z961 Presence of intraocular lens: Secondary | ICD-10-CM | POA: Diagnosis not present

## 2024-09-12 DIAGNOSIS — H353132 Nonexudative age-related macular degeneration, bilateral, intermediate dry stage: Secondary | ICD-10-CM | POA: Diagnosis not present

## 2024-09-12 DIAGNOSIS — E119 Type 2 diabetes mellitus without complications: Secondary | ICD-10-CM | POA: Diagnosis not present

## 2024-09-12 NOTE — Telephone Encounter (Signed)
 Received approval letter from [facility] [facility] thru 10/18/2025 approval letter index left a HIPAA Vm at pt number.

## 2024-09-13 ENCOUNTER — Telehealth: Payer: Self-pay

## 2024-09-13 NOTE — Telephone Encounter (Signed)
 Copied from CRM #[id number]. Topic: General - Other >> Sep 13, 2024  3:04 PM Pinkey ORN wrote: Reason for CRM: Help Button >> Sep 13, 2024  3:07 PM Pinkey ORN wrote: Patient called in states she's switching over to [facility] # [phone] the beginning of the year, and they're requesting that Sowles, Krichna, MD fax over documentation explaining why patient needs the help button. Patient states she's been on it for 3 years so far. Patient's [employer] Member ID # [id number] will need to be provided with faxed documentation. Any further questions or concerns, feel free to follow up with patient directly.

## 2024-09-17 ENCOUNTER — Other Ambulatory Visit: Payer: Self-pay | Admitting: Family Medicine

## 2024-09-17 DIAGNOSIS — M109 Gout, unspecified: Secondary | ICD-10-CM

## 2024-09-18 ENCOUNTER — Other Ambulatory Visit: Payer: Self-pay | Admitting: Family Medicine

## 2024-09-18 DIAGNOSIS — M109 Gout, unspecified: Secondary | ICD-10-CM

## 2024-09-21 NOTE — Telephone Encounter (Signed)
 Requested Prescriptions  Refused Prescriptions Disp Refills   allopurinol  (ZYLOPRIM ) 300 MG tablet [Pharmacy Med Name: ALLOPURINOL  300MG  TABLETS] 90 tablet 3    Sig: TAKE 1 TABLET BY MOUTH DAILY     Endocrinology:  Gout Agents - allopurinol  Failed - 09/21/2024 11:47 AM      Failed - Cr in normal range and within 360 days    Creat  Date Value Ref Range Status  09/05/2024 1.29 (H) 0.60 - 1.00 mg/dL Final   Creatinine, Urine  Date Value Ref Range Status  09/05/2024 38 20 - 275 mg/dL Final         Passed - Uric Acid in normal range and within 360 days    Uric Acid, Serum  Date Value Ref Range Status  09/05/2024 6.0 2.5 - 7.0 mg/dL Final    Comment:    Therapeutic target for gout patients: <6.0 mg/dL .    Uric Acid  Date Value Ref Range Status  04/04/2015 6.0 2.5 - 7.1 mg/dL Final    Comment:               Therapeutic target for gout patients: <6.0         Passed - Valid encounter within last 12 months    Recent Outpatient Visits           2 weeks ago Controlled type 2 diabetes mellitus with stage 3 chronic kidney disease, without long-term current use of insulin  [facility])   [place] [facility] Glenard Mire, MD   1 month ago Bilateral edema of lower extremity   [place] [facility] Glenard Mire, MD   1 month ago Bilateral edema of lower extremity   [facility] Bernardo Fend, DO   2 months ago Hospital discharge follow-up   [facility] Glenard Mire, MD   4 months ago Controlled type 2 diabetes mellitus with stage 3 chronic kidney disease, without long-term current use of insulin  [facility])    [facility] [place], Mire, MD              Passed - CBC within normal limits and completed in the last 12 months    WBC  Date Value Ref Range Status  09/05/2024 9.4 3.8 - 10.8 Thousand/uL Final   RBC  Date Value Ref Range Status  09/05/2024 4.87 3.80 -  5.10 Million/uL Final   Hemoglobin  Date Value Ref Range Status  09/05/2024 14.2 11.7 - 15.5 g/dL Final  87/82/7980 88.8 11.1 - 15.9 g/dL Final   HCT  Date Value Ref Range Status  09/05/2024 44.5 35.0 - 45.0 % Final   Hematocrit  Date Value Ref Range Status  10/04/2018 34.3 34.0 - 46.6 % Final   MCHC  Date Value Ref Range Status  09/05/2024 31.9 (L) 32.0 - 36.0 g/dL Final    Comment:    For adults, a slight decrease in the calculated MCHC value (in the range of 30 to 32 g/dL) is most likely not clinically significant; however, it should be interpreted with caution in correlation with other red cell parameters and the patient's clinical condition.    [facility]  Date Value Ref Range Status  09/05/2024 29.2 27.0 - 33.0 pg Final   MCV  Date Value Ref Range Status  09/05/2024 91.4 80.0 - 100.0 fL Final  10/04/2018 95 79 - 97 fL Final   No results found for: PLTCOUNTKUC, LABPLAT, POCPLA RDW  Date Value Ref Range Status  09/05/2024 14.9 11.0 - 15.0 % Final  10/04/2018 14.5 12.3 - 15.4 % Final    Comment:    **Effective October 24, 2018, the RDW pediatric reference**   interval will be removed and the adult reference interval   will be changing to:                             Female 11.7 - 15.4                                                      Female 11.6 - 15.4

## 2024-09-21 NOTE — Telephone Encounter (Signed)
 Requested Prescriptions  Refused Prescriptions Disp Refills   allopurinol  (ZYLOPRIM ) 300 MG tablet [Pharmacy Med Name: ALLOPURINOL  300MG  TABLETS] 90 tablet 3    Sig: TAKE 1 TABLET BY MOUTH DAILY     Endocrinology:  Gout Agents - allopurinol  Failed - 09/21/2024  9:54 AM      Failed - Cr in normal range and within 360 days    Creat  Date Value Ref Range Status  09/05/2024 1.29 (H) 0.60 - 1.00 mg/dL Final   Creatinine, Urine  Date Value Ref Range Status  09/05/2024 38 20 - 275 mg/dL Final         Passed - Uric Acid in normal range and within 360 days    Uric Acid, Serum  Date Value Ref Range Status  09/05/2024 6.0 2.5 - 7.0 mg/dL Final    Comment:    Therapeutic target for gout patients: <6.0 mg/dL .    Uric Acid  Date Value Ref Range Status  04/04/2015 6.0 2.5 - 7.1 mg/dL Final    Comment:               Therapeutic target for gout patients: <6.0         Passed - Valid encounter within last 12 months    Recent Outpatient Visits           2 weeks ago Controlled type 2 diabetes mellitus with stage 3 chronic kidney disease, without long-term current use of insulin  [facility])   [place] [facility] Glenard Mire, MD   1 month ago Bilateral edema of lower extremity   [place] [facility] Glenard Mire, MD   1 month ago Bilateral edema of lower extremity   [facility] Bernardo Fend, DO   2 months ago Hospital discharge follow-up   [facility] Glenard Mire, MD   4 months ago Controlled type 2 diabetes mellitus with stage 3 chronic kidney disease, without long-term current use of insulin  [facility])   [place] [facility] [place], Mire, MD              Passed - CBC within normal limits and completed in the last 12 months    WBC  Date Value Ref Range Status  09/05/2024 9.4 3.8 - 10.8 Thousand/uL Final   RBC  Date Value Ref Range Status  09/05/2024 4.87 3.80 -  5.10 Million/uL Final   Hemoglobin  Date Value Ref Range Status  09/05/2024 14.2 11.7 - 15.5 g/dL Final  87/82/7980 88.8 11.1 - 15.9 g/dL Final   HCT  Date Value Ref Range Status  09/05/2024 44.5 35.0 - 45.0 % Final   Hematocrit  Date Value Ref Range Status  10/04/2018 34.3 34.0 - 46.6 % Final   MCHC  Date Value Ref Range Status  09/05/2024 31.9 (L) 32.0 - 36.0 g/dL Final    Comment:    For adults, a slight decrease in the calculated MCHC value (in the range of 30 to 32 g/dL) is most likely not clinically significant; however, it should be interpreted with caution in correlation with other red cell parameters and the patient's clinical condition.    [facility]  Date Value Ref Range Status  09/05/2024 29.2 27.0 - 33.0 pg Final   MCV  Date Value Ref Range Status  09/05/2024 91.4 80.0 - 100.0 fL Final  10/04/2018 95 79 - 97 fL Final   No results found for: PLTCOUNTKUC, LABPLAT, POCPLA RDW  Date Value Ref Range Status  09/05/2024 14.9 11.0 - 15.0 % Final  10/04/2018 14.5 12.3 - 15.4 % Final    Comment:    **Effective October 24, 2018, the RDW pediatric reference**   interval will be removed and the adult reference interval   will be changing to:                             Female 11.7 - 15.4                                                      Female 11.6 - 15.4

## 2024-10-17 ENCOUNTER — Telehealth: Payer: Self-pay

## 2024-10-17 NOTE — Telephone Encounter (Signed)
 Copied from CRM [id number]. Topic: General - Other >> Oct 17, 2024 10:18 AM Amber Avery wrote: Reason for CRM: Pt wants to know if Dr. Glenard will complete the credentialing for [employer] so that way the patient can assign her as her PCP again. The site currently says pending. Please advise

## 2024-10-17 NOTE — Telephone Encounter (Signed)
Do you know what she is referring to? 

## 2024-10-23 ENCOUNTER — Encounter: Payer: Self-pay | Admitting: Family Medicine

## 2024-10-24 ENCOUNTER — Other Ambulatory Visit: Payer: Self-pay | Admitting: Family Medicine

## 2024-10-24 MED ORDER — DILTIAZEM HCL ER COATED BEADS 120 MG PO CP24: 240.0000 mg | ORAL_CAPSULE | Freq: Every day | ORAL | 1 refills | Status: AC

## 2024-10-24 MED ORDER — DILTIAZEM HCL ER COATED BEADS 120 MG PO CP24: 240.0000 mg | ORAL_CAPSULE | Freq: Every day | ORAL | 1 refills | Status: DC

## 2024-11-02 ENCOUNTER — Encounter: Payer: Self-pay | Admitting: Oncology

## 2024-11-07 ENCOUNTER — Encounter: Payer: Self-pay | Admitting: Oncology

## 2024-11-14 ENCOUNTER — Encounter: Payer: Self-pay | Admitting: Oncology

## 2024-11-15 ENCOUNTER — Ambulatory Visit: Admitting: Family Medicine

## 2024-11-24 ENCOUNTER — Other Ambulatory Visit: Payer: Self-pay | Admitting: Family Medicine

## 2024-11-28 ENCOUNTER — Ambulatory Visit: Admitting: Family Medicine

## 2025-01-03 ENCOUNTER — Ambulatory Visit: Admitting: Family Medicine

## 2025-01-22 ENCOUNTER — Other Ambulatory Visit

## 2025-05-31 ENCOUNTER — Ambulatory Visit
